# Patient Record
Sex: Female | Born: 1955
Health system: Southern US, Community
[De-identification: ages and names within clinical notes are randomized; demographics above are authoritative.]

## PROBLEM LIST (undated history)

## (undated) DIAGNOSIS — G4733 Obstructive sleep apnea (adult) (pediatric): Secondary | ICD-10-CM

## (undated) DIAGNOSIS — I739 Peripheral vascular disease, unspecified: Secondary | ICD-10-CM

## (undated) DIAGNOSIS — I608 Other nontraumatic subarachnoid hemorrhage: Secondary | ICD-10-CM

## (undated) DIAGNOSIS — D649 Anemia, unspecified: Secondary | ICD-10-CM

## (undated) DIAGNOSIS — M1990 Unspecified osteoarthritis, unspecified site: Secondary | ICD-10-CM

## (undated) DIAGNOSIS — E785 Hyperlipidemia, unspecified: Secondary | ICD-10-CM

## (undated) DIAGNOSIS — R569 Unspecified convulsions: Secondary | ICD-10-CM

## (undated) DIAGNOSIS — I5032 Chronic diastolic (congestive) heart failure: Secondary | ICD-10-CM

## (undated) DIAGNOSIS — I639 Cerebral infarction, unspecified: Secondary | ICD-10-CM

## (undated) DIAGNOSIS — Z9989 Dependence on other enabling machines and devices: Secondary | ICD-10-CM

## (undated) DIAGNOSIS — R011 Cardiac murmur, unspecified: Secondary | ICD-10-CM

## (undated) DIAGNOSIS — I340 Nonrheumatic mitral (valve) insufficiency: Secondary | ICD-10-CM

## (undated) DIAGNOSIS — K219 Gastro-esophageal reflux disease without esophagitis: Secondary | ICD-10-CM

## (undated) DIAGNOSIS — E559 Vitamin D deficiency, unspecified: Secondary | ICD-10-CM

## (undated) DIAGNOSIS — I10 Essential (primary) hypertension: Secondary | ICD-10-CM

## (undated) HISTORY — DX: Nonrheumatic mitral (valve) insufficiency: I34.0

## (undated) HISTORY — DX: Cardiac murmur, unspecified: R01.1

## (undated) HISTORY — DX: Obstructive sleep apnea (adult) (pediatric): G47.33

## (undated) HISTORY — DX: Essential (primary) hypertension: I10

## (undated) HISTORY — PX: ABDOMINAL HYSTERECTOMY: SHX81

## (undated) HISTORY — DX: Unspecified convulsions: R56.9

## (undated) HISTORY — DX: Chronic diastolic (congestive) heart failure: I50.32

## (undated) HISTORY — DX: Hyperlipidemia, unspecified: E78.5

## (undated) HISTORY — DX: Other nontraumatic subarachnoid hemorrhage: I60.8

## (undated) HISTORY — DX: Vitamin D deficiency, unspecified: E55.9

## (undated) HISTORY — PX: TONSILLECTOMY AND ADENOIDECTOMY: SUR1326

## (undated) HISTORY — PX: TUBAL LIGATION: SHX77

## (undated) HISTORY — PX: OTHER SURGICAL HISTORY: SHX169

## (undated) HISTORY — PX: ANEURYSM COILING: SHX5349

## (undated) HISTORY — DX: Obstructive sleep apnea (adult) (pediatric): Z99.89

---
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
## 1997-05-27 ENCOUNTER — Encounter: Admission: RE | Admit: 1997-05-27 | Discharge: 1997-05-27 | Payer: Self-pay | Admitting: Hematology and Oncology

## 1997-07-07 ENCOUNTER — Encounter: Admission: RE | Admit: 1997-07-07 | Discharge: 1997-07-07 | Payer: Self-pay | Admitting: Hematology and Oncology

## 1997-07-16 ENCOUNTER — Encounter: Admission: RE | Admit: 1997-07-16 | Discharge: 1997-10-14 | Payer: Self-pay | Admitting: *Deleted

## 1997-07-24 ENCOUNTER — Encounter: Admission: RE | Admit: 1997-07-24 | Discharge: 1997-07-24 | Payer: Self-pay | Admitting: Internal Medicine

## 1997-08-21 ENCOUNTER — Encounter: Admission: RE | Admit: 1997-08-21 | Discharge: 1997-08-21 | Payer: Self-pay | Admitting: Internal Medicine

## 1997-09-29 ENCOUNTER — Encounter: Admission: RE | Admit: 1997-09-29 | Discharge: 1997-09-29 | Payer: Self-pay | Admitting: Internal Medicine

## 1998-02-22 ENCOUNTER — Ambulatory Visit (HOSPITAL_COMMUNITY): Admission: RE | Admit: 1998-02-22 | Discharge: 1998-02-22 | Payer: Self-pay

## 1998-04-07 ENCOUNTER — Encounter: Admission: RE | Admit: 1998-04-07 | Discharge: 1998-04-07 | Payer: Self-pay | Admitting: Hematology and Oncology

## 1998-08-10 ENCOUNTER — Encounter: Admission: RE | Admit: 1998-08-10 | Discharge: 1998-08-10 | Payer: Self-pay | Admitting: Internal Medicine

## 1999-03-02 ENCOUNTER — Ambulatory Visit (HOSPITAL_COMMUNITY): Admission: RE | Admit: 1999-03-02 | Discharge: 1999-03-02 | Payer: Self-pay | Admitting: Gastroenterology

## 1999-09-21 ENCOUNTER — Ambulatory Visit (HOSPITAL_COMMUNITY): Admission: RE | Admit: 1999-09-21 | Discharge: 1999-09-21 | Payer: Self-pay | Admitting: Internal Medicine

## 1999-09-21 ENCOUNTER — Encounter: Payer: Self-pay | Admitting: Internal Medicine

## 1999-10-05 ENCOUNTER — Encounter: Payer: Self-pay | Admitting: Internal Medicine

## 1999-10-05 ENCOUNTER — Ambulatory Visit (HOSPITAL_COMMUNITY): Admission: RE | Admit: 1999-10-05 | Discharge: 1999-10-05 | Payer: Self-pay | Admitting: Internal Medicine

## 1999-11-29 ENCOUNTER — Emergency Department (HOSPITAL_COMMUNITY): Admission: EM | Admit: 1999-11-29 | Discharge: 1999-11-29 | Payer: Self-pay | Admitting: Emergency Medicine

## 1999-12-09 ENCOUNTER — Emergency Department (HOSPITAL_COMMUNITY): Admission: EM | Admit: 1999-12-09 | Discharge: 1999-12-09 | Payer: Self-pay | Admitting: Emergency Medicine

## 2000-05-08 ENCOUNTER — Ambulatory Visit (HOSPITAL_COMMUNITY): Admission: RE | Admit: 2000-05-08 | Discharge: 2000-05-08 | Payer: Self-pay | Admitting: Urology

## 2000-05-08 ENCOUNTER — Encounter: Payer: Self-pay | Admitting: Urology

## 2000-06-04 ENCOUNTER — Encounter: Payer: Self-pay | Admitting: Internal Medicine

## 2000-06-04 ENCOUNTER — Ambulatory Visit (HOSPITAL_COMMUNITY): Admission: RE | Admit: 2000-06-04 | Discharge: 2000-06-04 | Payer: Self-pay | Admitting: Internal Medicine

## 2000-11-19 ENCOUNTER — Encounter: Payer: Self-pay | Admitting: Urology

## 2000-11-19 ENCOUNTER — Ambulatory Visit (HOSPITAL_COMMUNITY): Admission: RE | Admit: 2000-11-19 | Discharge: 2000-11-19 | Payer: Self-pay | Admitting: Urology

## 2001-06-06 ENCOUNTER — Encounter: Payer: Self-pay | Admitting: Internal Medicine

## 2001-06-06 ENCOUNTER — Ambulatory Visit (HOSPITAL_COMMUNITY): Admission: RE | Admit: 2001-06-06 | Discharge: 2001-06-06 | Payer: Self-pay | Admitting: Internal Medicine

## 2001-11-27 ENCOUNTER — Inpatient Hospital Stay (HOSPITAL_COMMUNITY): Admission: EM | Admit: 2001-11-27 | Discharge: 2001-12-12 | Payer: Self-pay | Admitting: Emergency Medicine

## 2001-11-27 ENCOUNTER — Encounter: Payer: Self-pay | Admitting: Neurosurgery

## 2001-11-27 ENCOUNTER — Encounter: Payer: Self-pay | Admitting: Emergency Medicine

## 2001-11-29 ENCOUNTER — Encounter: Payer: Self-pay | Admitting: Pulmonary Disease

## 2001-11-29 ENCOUNTER — Encounter: Payer: Self-pay | Admitting: Neurosurgery

## 2001-11-30 ENCOUNTER — Encounter: Payer: Self-pay | Admitting: Neurosurgery

## 2001-12-03 ENCOUNTER — Encounter: Payer: Self-pay | Admitting: Neurosurgery

## 2002-01-28 ENCOUNTER — Inpatient Hospital Stay (HOSPITAL_COMMUNITY): Admission: AD | Admit: 2002-01-28 | Discharge: 2002-01-29 | Payer: Self-pay | Admitting: Interventional Radiology

## 2002-03-26 ENCOUNTER — Ambulatory Visit (HOSPITAL_COMMUNITY): Admission: RE | Admit: 2002-03-26 | Discharge: 2002-03-26 | Payer: Self-pay | Admitting: Interventional Radiology

## 2002-04-17 ENCOUNTER — Inpatient Hospital Stay (HOSPITAL_COMMUNITY): Admission: RE | Admit: 2002-04-17 | Discharge: 2002-04-18 | Payer: Self-pay | Admitting: Interventional Radiology

## 2002-06-09 ENCOUNTER — Ambulatory Visit (HOSPITAL_COMMUNITY): Admission: RE | Admit: 2002-06-09 | Discharge: 2002-06-09 | Payer: Self-pay | Admitting: Interventional Radiology

## 2002-10-10 ENCOUNTER — Ambulatory Visit (HOSPITAL_COMMUNITY): Admission: RE | Admit: 2002-10-10 | Discharge: 2002-10-10 | Payer: Self-pay | Admitting: Interventional Radiology

## 2002-11-26 ENCOUNTER — Ambulatory Visit (HOSPITAL_COMMUNITY): Admission: RE | Admit: 2002-11-26 | Discharge: 2002-11-26 | Payer: Self-pay | Admitting: Internal Medicine

## 2003-01-27 ENCOUNTER — Ambulatory Visit (HOSPITAL_COMMUNITY): Admission: RE | Admit: 2003-01-27 | Discharge: 2003-01-27 | Payer: Self-pay | Admitting: Interventional Radiology

## 2003-05-01 ENCOUNTER — Emergency Department (HOSPITAL_COMMUNITY): Admission: EM | Admit: 2003-05-01 | Discharge: 2003-05-01 | Payer: Self-pay | Admitting: Emergency Medicine

## 2003-09-18 ENCOUNTER — Ambulatory Visit (HOSPITAL_COMMUNITY): Admission: RE | Admit: 2003-09-18 | Discharge: 2003-09-18 | Payer: Self-pay | Admitting: Interventional Radiology

## 2003-12-15 ENCOUNTER — Ambulatory Visit (HOSPITAL_COMMUNITY): Admission: RE | Admit: 2003-12-15 | Discharge: 2003-12-15 | Payer: Self-pay | Admitting: Internal Medicine

## 2004-05-17 ENCOUNTER — Ambulatory Visit (HOSPITAL_COMMUNITY): Admission: RE | Admit: 2004-05-17 | Discharge: 2004-05-17 | Payer: Self-pay | Admitting: Internal Medicine

## 2004-06-22 ENCOUNTER — Ambulatory Visit (HOSPITAL_COMMUNITY): Admission: RE | Admit: 2004-06-22 | Discharge: 2004-06-22 | Payer: Self-pay | Admitting: Interventional Radiology

## 2004-09-12 ENCOUNTER — Ambulatory Visit (HOSPITAL_COMMUNITY): Admission: RE | Admit: 2004-09-12 | Discharge: 2004-09-12 | Payer: Self-pay | Admitting: Gastroenterology

## 2004-09-12 ENCOUNTER — Encounter (INDEPENDENT_AMBULATORY_CARE_PROVIDER_SITE_OTHER): Payer: Self-pay | Admitting: *Deleted

## 2004-12-19 ENCOUNTER — Ambulatory Visit (HOSPITAL_COMMUNITY): Admission: RE | Admit: 2004-12-19 | Discharge: 2004-12-19 | Payer: Self-pay | Admitting: Internal Medicine

## 2005-01-21 ENCOUNTER — Ambulatory Visit (HOSPITAL_COMMUNITY): Admission: RE | Admit: 2005-01-21 | Discharge: 2005-01-21 | Payer: Self-pay | Admitting: Interventional Radiology

## 2005-01-23 DIAGNOSIS — I639 Cerebral infarction, unspecified: Secondary | ICD-10-CM

## 2005-01-23 HISTORY — DX: Cerebral infarction, unspecified: I63.9

## 2005-12-21 ENCOUNTER — Ambulatory Visit (HOSPITAL_COMMUNITY): Admission: RE | Admit: 2005-12-21 | Discharge: 2005-12-21 | Payer: Self-pay | Admitting: Internal Medicine

## 2006-12-24 ENCOUNTER — Ambulatory Visit (HOSPITAL_COMMUNITY): Admission: RE | Admit: 2006-12-24 | Discharge: 2006-12-24 | Payer: Self-pay | Admitting: Internal Medicine

## 2007-03-20 ENCOUNTER — Ambulatory Visit (HOSPITAL_COMMUNITY): Admission: RE | Admit: 2007-03-20 | Discharge: 2007-03-20 | Payer: Self-pay | Admitting: Interventional Radiology

## 2007-04-24 ENCOUNTER — Encounter: Payer: Self-pay | Admitting: Interventional Radiology

## 2007-04-29 ENCOUNTER — Ambulatory Visit (HOSPITAL_COMMUNITY): Admission: RE | Admit: 2007-04-29 | Discharge: 2007-04-29 | Payer: Self-pay | Admitting: Interventional Radiology

## 2007-06-09 ENCOUNTER — Emergency Department (HOSPITAL_COMMUNITY): Admission: EM | Admit: 2007-06-09 | Discharge: 2007-06-09 | Payer: Self-pay | Admitting: Emergency Medicine

## 2007-12-26 ENCOUNTER — Ambulatory Visit (HOSPITAL_COMMUNITY): Admission: RE | Admit: 2007-12-26 | Discharge: 2007-12-26 | Payer: Self-pay | Admitting: Internal Medicine

## 2008-04-28 ENCOUNTER — Ambulatory Visit (HOSPITAL_COMMUNITY): Admission: RE | Admit: 2008-04-28 | Discharge: 2008-04-28 | Payer: Self-pay | Admitting: Interventional Radiology

## 2008-12-09 ENCOUNTER — Emergency Department (HOSPITAL_COMMUNITY): Admission: EM | Admit: 2008-12-09 | Discharge: 2008-12-09 | Payer: Self-pay | Admitting: Emergency Medicine

## 2008-12-28 ENCOUNTER — Ambulatory Visit (HOSPITAL_COMMUNITY): Admission: RE | Admit: 2008-12-28 | Discharge: 2008-12-28 | Payer: Self-pay | Admitting: Obstetrics & Gynecology

## 2009-07-05 ENCOUNTER — Ambulatory Visit (HOSPITAL_COMMUNITY): Admission: RE | Admit: 2009-07-05 | Discharge: 2009-07-05 | Payer: Self-pay | Admitting: Interventional Radiology

## 2009-10-23 LAB — HM COLONOSCOPY: HM Colonoscopy: NEGATIVE

## 2010-01-03 ENCOUNTER — Ambulatory Visit (HOSPITAL_COMMUNITY)
Admission: RE | Admit: 2010-01-03 | Discharge: 2010-01-03 | Payer: Self-pay | Source: Home / Self Care | Attending: Internal Medicine | Admitting: Internal Medicine

## 2010-01-10 ENCOUNTER — Encounter
Admission: RE | Admit: 2010-01-10 | Discharge: 2010-01-10 | Payer: Self-pay | Source: Home / Self Care | Attending: Internal Medicine | Admitting: Internal Medicine

## 2010-04-11 LAB — CREATININE, SERUM
GFR calc Af Amer: >60 mL/min (ref >60)
GFR calc non Af Amer: >60 mL/min (ref >60)

## 2010-04-11 LAB — BUN: BUN: 15 mg/dL (ref 6–23)

## 2010-04-27 LAB — POCT I-STAT, CHEM 8
Calcium, Ion: 1.22 mmol/L (ref 1.12–1.32)
Creatinine, Ser: 1 mg/dL (ref 0.4–1.2)
Sodium: 138 mEq/L (ref 135–145)

## 2010-05-04 LAB — CREATININE, SERUM: GFR calc Af Amer: >60 mL/min (ref >60)

## 2010-05-04 LAB — BUN: BUN: 11 mg/dL (ref 6–23)

## 2010-06-10 NOTE — Discharge Summary (Signed)
   Yolanda Espinoza, Yolanda Espinoza                           ACCOUNT NO.:  [id number]   MEDICAL RECORD NO.:  [id number]                   PATIENT TYPE:  INP   LOCATION:  [id number]                                 FACILITY:  [facility]   PHYSICIAN:  Elizabeth Sauer, M.D.                   DATE OF BIRTH:  [birth year]   DATE OF ADMISSION:  11/27/2001  DATE OF DISCHARGE:  12/12/2001                                 DISCHARGE SUMMARY   ADMITTING DIAGNOSIS:  Subarachnoid hemorrhage.   DISCHARGE DIAGNOSIS:  Subarachnoid hemorrhage.   PROCEDURE:  Angiographic coiling of posterior communicating artery aneurysm.   SURGEON:  Elizabeth Sauer, M.D.   COMPLICATIONS:  None.   DISCHARGE STATUS:  Home, well.   BODY OF TEXT:  This is a 55 year old right-handed black female with History  and Physical as recounted on the chart.  She suffered a subarachnoid  hemorrhage and a second bleed on 11/27/01.  She was a grade III subarachnoid  hemorrhage and was admitted.  She underwent coiling of the aneurysm.  She  was extubated two days after the coiling, is awake and alert.  Started on  nimodipine.  While on amlodipine, she was followed with transcranial  Dopplers and was found to have mild vasospasm which never became  symptomatic.  Serial CT scans did not demonstrate any hydrocephalus, and she  remained neurologically intact.  Chief complaint was headache which was  managed with a PCA and then oral pain medications.   Currently, she is 14 days out from her hemorrhage, awake, alert, and  oriented, on nimodipine with decreasing transcranial Dopplers and no  headache.  Her neurologic exam is intact.  She is being discharged home in  the care of her family with another week of nimodipine.  Her follow up will  be in the [place] office in three or four weeks at  which time we will arrange for another angiogram and attempted coiling of  the contralateral posterior communicating artery aneurysm.                                  Elizabeth Sauer, M.D.    MWR/MEDQ  D:  12/12/2001  T:  12/12/2001  Job:  [id number]

## 2010-06-10 NOTE — Procedures (Signed)
[place]. [facility]  Patient:    Yolanda Espinoza, Yolanda Espinoza                       MRN: [id number] Proc. Date: 03/02/99 Adm. Date:  [id number] Attending:  Juanita Craver CC:         Orpah Melter, M.D.                           Procedure Report  DATE OF BIRTH:  [birth year]  REFERRING PHYSICIAN:  Orpah Melter, M.D.  PROCEDURE PERFORMED:  Esophagogastroduodenoscopy.  ENDOSCOPIST:  Nelwyn Salisbury, M.D.  INSTRUMENT USED:  Olympus video panendoscope.  INDICATIONS FOR PROCEDURE:  Iron-deficiency anemia in a 55 year old black female, rule out peptic ulcer disease, esophagitis, gastritis, AVMs, etc.  PREPROCEDURE PREPARATION:  Informed consent was procured from the patient.  The  patient was fasted for eight hours prior to the procedure.  PREPROCEDURE PHYSICAL:  VITAL SIGNS:  Stable except for some hypertension with blood pressure of 190/130.  NECK:  Supple.  CHEST:  Clear to auscultation, S1 and S2 regular.  Surgical scar present in the  right chest.  ABDOMEN:  Soft with normal abdominal bowel sounds.  No hepatosplenomegaly and no masses palpable.  DESCRIPTION OF PROCEDURE:  The patient was placed in the left lateral decubitus  position, and sedated with 50 mg of Demerol and 5 mg of Versed intravenously. nce the patient was adequately sedated, maintained on low flow oxygen and continuous cardiac monitoring, the Olympus video panendoscope was advanced through the mouth piece over the tongue into the esophagus under direct vision.  The entire esophagus appeared normal without evidence of ring stricture, masses, lesions, or esophagitis.  The scope was then advanced into the stomach.  There was patchy erythema seen in the high fundus and cardia consistent with mild to moderate gastritis.  No frank ulcers were seen.  The mid body and the antrum appeared healthy, and so did the proximal small bowel.  The patient tolerated the procedure well  without complication.  IMPRESSION:  Patchy gastritis in the high fundus and cardia, otherwise normal esophagogastroduodenoscopy.  RECOMMENDATIONS: 1. The patient has strongly been advised to refrain from the use of all    nonsteroidals. 2. She will be tried on Prevacid ____ mg one p.o. q.d., and samples will be given    to ________ next six weeks.  Further recommendations will be made after    colonoscopy has been completed. DD:  03/02/99 TD:  03/02/99 Job: [id number]

## 2010-06-10 NOTE — Op Note (Signed)
Yolanda Espinoza, Yolanda Espinoza                 ACCOUNT NO.:  [id number]   MEDICAL RECORD NO.:  [id number]          PATIENT TYPE:  AMB   LOCATION:  ENDO                         FACILITY:  [facility]   PHYSICIAN:  Nelwyn Salisbury, M.D.  DATE OF BIRTH:  01-May-1955   DATE OF PROCEDURE:  09/12/2004  DATE OF DISCHARGE:                                 OPERATIVE REPORT   PROCEDURE PERFORMED:  Colonoscopy with cold biopsies x1.   ENDOSCOPIST:  Nelwyn Salisbury, M.D.   INSTRUMENT USED:  Olympus video colonoscope.   INDICATION FOR PROCEDURE:  A 55 year old African-American female with a  personal history of colonic polyps removed in the past and mild anemia,  undergoing a repeat colonoscopy.  Rule out recurrent polyps.   PREPROCEDURE PREPARATION:  Informed consent was procured from the patient.  The patient was fasted for eight hours prior to the procedure and prepped  with a bottle of magnesium citrate and a gallon of GoLYTELY the night prior  to the procedure.  The risks and benefits of the procedure, including a 10%  miss rate of cancer and polyps, were discussed with the patient as well.   PREPROCEDURE PHYSICAL:  VITAL SIGNS:  The patient had stable vital signs.  NECK:  Supple.  CHEST:  Clear to auscultation.  ABDOMEN:  Soft with normal bowel sounds.   DESCRIPTION OF PROCEDURE:  The patient was placed in the left lateral  decubitus position and sedated with 70 mg of Demerol and 7 mg of Versed in  slow incremental doses.  Once the patient was adequately sedate and  maintained on low-flow oxygen and continuous cardiac monitoring, the Olympus  video colonoscope was advanced from the rectum to the cecum with difficulty.  There was a large amount of residual stool in the colon.  Multiple washes  were done.  Small lesions could be missed.  A small sessile polyp was  biopsied from the IC valve (cold biopsies x3).  The rest of the exam was  unremarkable; however, as there was a large amount of residual stool  in the  colon, small lesions could be missed.   IMPRESSION:  1.  Small sessile polyp biopsied from right colon over the IC valve (cold      biopsies x3).  2.  Otherwise unrevealing exam.   RECOMMENDATIONS:  1.  Await pathology results.  2.  Repeat colonoscopy depending on pathology results.  3.  Avoid nonsteroidals for the next two weeks.  4.  Outpatient follow-up as the need arises in the future.      Nelwyn Salisbury, M.D.  Electronically Signed     JNM/MEDQ  D:  09/12/2004  T:  09/12/2004  Job:  [id number]   cc:   Ardeen Jourdain, M.D.  [id number]  [place] [address]  Fax: [phone]

## 2010-06-10 NOTE — Procedures (Signed)
[place]. [facility]  Patient:    Yolanda Espinoza, Yolanda Espinoza                       MRN: [id number] Proc. Date: 03/02/99 Adm. Date:  [id number] Attending:  Juanita Craver                           Procedure Report  DATE OF BIRTH:  06/13/1955  REFERRING PHYSICIAN:  Elenore Rota ______ , M.D.  PROCEDURE PERFORMED:  Colonoscopy with snare polypectomy.  ENDOSCOPIST:  Nelwyn Salisbury, M.D.  INSTRUMENT USED:  Olympus video colonoscope.  INDICATIONS:  Iron-deficiency anemia in a 55 year old black female.  Rule out masses, polyps, erosions, ulcerations, etc.  PREPROCEDURE PREPARATION:  Informed consent was procured from the patient.  The  patient was fasted for 8 hours prior to the procedure and prepped with a bottle of magnesium citrate and a gallon of NuLytely the night prior to the procedure.  PREPROCEDURE PHYSICAL:  Patient has stable vital signs.  NECK:  Supple.  CHEST:  Clear to auscultation. S1, S2 regular.  ABDOMEN:  Soft with normal abdominal bowel sounds.  DESCRIPTION OF PROCEDURE:  The patient was placed in left lateral decubitus position and sedated with an additional 30 mg of Demerol and 3 mg of Versed intravenously.  Once the patient was adequately sedated and maintained on low-flow oxygen and continuous cardiac monitoring, the Olympus video colonoscope was advanced from the rectum to the cecum without difficulty.  The patient has a small pedunculated polyp snared from 25 cm without difficulty.  No abnormalities were  present.  The patient tolerated the procedure well without complication.  The procedure was completed after the cecum.  IMPRESSION:  One small pedunculated polyp removed from left colon at 25 cm by snare polypectomy.  Otherwise normal-appearing colon.  RECOMMENDATIONS: 1. Await pathology results. 2. Avoid nonsteroidals. 3. Follow up in the office the next two weeks for further recommendations. DD:  03/02/99 TD:  03/02/99 Job:  [id number]

## 2010-06-10 NOTE — H&P (Signed)
NAME:  Yolanda Espinoza, Yolanda Espinoza NO.:  [id number]   MEDICAL RECORD NO.:  [id number]                   PATIENT TYPE:   LOCATION:                                       FACILITY:   PHYSICIAN:  Elizabeth Sauer, M.D.                   DATE OF BIRTH:  1955/04/28   DATE OF ADMISSION:  11/27/2001  DATE OF DISCHARGE:                                HISTORY & PHYSICAL   ADMISSION DIAGNOSIS:  Subarachnoid hemorrhage.   HISTORY OF PRESENT ILLNESS:  A 55 year old presumably right handed black  female with the onset of headache between 4 and 4:30 this morning according  to her boyfriend. She had no loss of consciousness but said he said she was  very stunned and could not move for a while. She was transferred to [facility], apparently arrived there about 6, went to CT scan, I was called about  7 a.m. Apparently at about 7:10, the patient had a second ictus requiring  intubation. When I was called at 7 a.m., I requested Nipride for blood  pressure control and angiogram. When I arrived, the patient was intubated,  coughing on the endotracheal tube with a blood pressure in the 200s/100s,  valsalving into tube. The Nipride was lying in the bed, and angio did not  know about the patient.   At that time, I ordered Pavulon and morphine for sedation and strongly  encouraged the nurses and doctors in the emergency room to get the Nipride  started.   PAST MEDICAL HISTORY:  Hypertension. The patient is on Lotensin and Cozaar.   PAST SURGICAL HISTORY:  Stomach operation which from the incision appears to  be a hysterectomy.   ALLERGIES:  None known.   FAMILY HISTORY:  The patient is sedated on the ventilator, so the family  history is not obtainable.   SOCIAL HISTORY:  Not known at this time. She is sedated on the ventilator.   PHYSICAL EXAMINATION:  HEENT:  Within normal limits.  NECK:  Without lymphadenopathy.  CHEST:  Coarse rales.  CARDIAC EXAM:  Regular, rate, and  rhythm.  ABDOMEN:  Nontender with a well-healed incision.  EXTREMITIES:  Have good pulses.  GENITOURINARY:  Deferred.  NEUROLOGICAL:  She is sedated but slightly opens her eyes. Her pupils are  equal, round, and reactive to light. Her extraocular movements are intact.  She has a positive cough, positive gag, and is moving all extremities weakly  after the Pavulon.   ASSESSMENT:  Grade III subarachnoid hemorrhage with a rebleed. She needs  angio and since she has had a second bleed will require coiling or clipping  fairly soon. The prognosis has been discussed with her boyfriend.  Elizabeth Sauer, M.D.   MWR/MEDQ  D:  11/27/2001  T:  11/27/2001  Job:  [id number]

## 2010-08-01 ENCOUNTER — Other Ambulatory Visit (HOSPITAL_COMMUNITY): Payer: Self-pay | Admitting: Interventional Radiology

## 2010-08-01 DIAGNOSIS — I729 Aneurysm of unspecified site: Secondary | ICD-10-CM

## 2010-08-01 DIAGNOSIS — R519 Headache, unspecified: Secondary | ICD-10-CM

## 2010-08-08 ENCOUNTER — Ambulatory Visit (HOSPITAL_COMMUNITY)
Admission: RE | Admit: 2010-08-08 | Discharge: 2010-08-08 | Disposition: A | Discharge status: Home / Self Care | Payer: BC Managed Care – PPO | Source: Ambulatory Visit | Attending: Interventional Radiology | Admitting: Interventional Radiology

## 2010-08-08 DIAGNOSIS — Z09 Encounter for follow-up examination after completed treatment for conditions other than malignant neoplasm: Secondary | ICD-10-CM | POA: Insufficient documentation

## 2010-08-08 DIAGNOSIS — I729 Aneurysm of unspecified site: Secondary | ICD-10-CM

## 2010-08-08 DIAGNOSIS — R51 Headache: Secondary | ICD-10-CM | POA: Insufficient documentation

## 2010-08-08 DIAGNOSIS — I720 Aneurysm of carotid artery: Secondary | ICD-10-CM | POA: Insufficient documentation

## 2010-08-08 LAB — BUN: BUN: 15 mg/dL (ref 6–23)

## 2010-08-08 LAB — CREATININE, SERUM: Creatinine, Ser: 0.81 mg/dL (ref 0.50–1.10)

## 2010-08-08 MED ORDER — GADOBENATE DIMEGLUMINE 529 MG/ML IV SOLN: 20.0000 mL | Freq: Once | INTRAVENOUS | Status: DC | PRN

## 2010-08-09 MED ORDER — GADOBENATE DIMEGLUMINE 529 MG/ML IV SOLN
20.0000 mL | Freq: Once | INTRAVENOUS | Status: AC
  Administered 2010-08-08: 20 mL via INTRAVENOUS

## 2010-10-17 LAB — CREATININE, SERUM
Creatinine, Ser: 0.93
GFR calc Af Amer: >60

## 2010-10-18 LAB — BASIC METABOLIC PANEL
Calcium: 10
GFR calc Af Amer: >60
GFR calc non Af Amer: >60
Sodium: 138

## 2010-10-18 LAB — PROTIME-INR: INR: 0.9

## 2010-10-18 LAB — CBC
Hemoglobin: 10.9 — ABNORMAL LOW
RBC: 3.83 — ABNORMAL LOW

## 2010-12-17 ENCOUNTER — Encounter: Payer: Self-pay | Admitting: Nurse Practitioner

## 2010-12-17 ENCOUNTER — Emergency Department (HOSPITAL_COMMUNITY)
Admission: EM | Admit: 2010-12-17 | Discharge: 2010-12-17 | Disposition: A | Discharge status: Home / Self Care | Payer: BC Managed Care – PPO | Attending: Emergency Medicine | Admitting: Emergency Medicine

## 2010-12-17 ENCOUNTER — Emergency Department (HOSPITAL_COMMUNITY): Payer: BC Managed Care – PPO

## 2010-12-17 DIAGNOSIS — R05 Cough: Secondary | ICD-10-CM | POA: Insufficient documentation

## 2010-12-17 DIAGNOSIS — R61 Generalized hyperhidrosis: Secondary | ICD-10-CM | POA: Insufficient documentation

## 2010-12-17 DIAGNOSIS — Z79899 Other long term (current) drug therapy: Secondary | ICD-10-CM | POA: Insufficient documentation

## 2010-12-17 DIAGNOSIS — R059 Cough, unspecified: Secondary | ICD-10-CM | POA: Insufficient documentation

## 2010-12-17 DIAGNOSIS — R5381 Other malaise: Secondary | ICD-10-CM | POA: Insufficient documentation

## 2010-12-17 DIAGNOSIS — J069 Acute upper respiratory infection, unspecified: Secondary | ICD-10-CM | POA: Insufficient documentation

## 2010-12-17 DIAGNOSIS — R0982 Postnasal drip: Secondary | ICD-10-CM | POA: Insufficient documentation

## 2010-12-17 DIAGNOSIS — R070 Pain in throat: Secondary | ICD-10-CM | POA: Insufficient documentation

## 2010-12-17 DIAGNOSIS — J343 Hypertrophy of nasal turbinates: Secondary | ICD-10-CM | POA: Insufficient documentation

## 2010-12-17 DIAGNOSIS — Z8673 Personal history of transient ischemic attack (TIA), and cerebral infarction without residual deficits: Secondary | ICD-10-CM | POA: Insufficient documentation

## 2010-12-17 DIAGNOSIS — R51 Headache: Secondary | ICD-10-CM | POA: Insufficient documentation

## 2010-12-17 DIAGNOSIS — J3489 Other specified disorders of nose and nasal sinuses: Secondary | ICD-10-CM | POA: Insufficient documentation

## 2010-12-17 DIAGNOSIS — IMO0001 Reserved for inherently not codable concepts without codable children: Secondary | ICD-10-CM | POA: Insufficient documentation

## 2010-12-17 DIAGNOSIS — R6883 Chills (without fever): Secondary | ICD-10-CM | POA: Insufficient documentation

## 2010-12-17 MED ORDER — HYDROCOD POLST-CHLORPHEN POLST 10-8 MG/5ML PO LQCR
5.0000 mL | Freq: Once | ORAL | Status: AC
  Administered 2010-12-17: 5 mL via ORAL
  Filled 2010-12-17: qty 5

## 2010-12-17 MED ORDER — HYDROCOD POLST-CHLORPHEN POLST 10-8 MG/5ML PO LQCR: 5.0000 mL | Freq: Two times a day (BID) | ORAL | Status: DC | PRN

## 2010-12-17 MED ORDER — OSELTAMIVIR PHOSPHATE 75 MG PO CAPS: 75.0000 mg | ORAL_CAPSULE | Freq: Two times a day (BID) | ORAL | Status: AC

## 2010-12-17 NOTE — ED Notes (Signed)
C/o dry cough and headache since yesterday

## 2010-12-17 NOTE — ED Provider Notes (Signed)
History     CSN: [id number] Arrival date & time: 12/17/2010 10:04 AM   First MD Initiated Contact with Patient 12/17/10 1025      Chief Complaint  Patient presents with  . Cough  . Headache    (Consider location/radiation/quality/duration/timing/severity/associated sxs/prior treatment) Patient is a 55 y.o. female presenting with cough. The history is provided by the patient.  Cough This is a new problem. The current episode started yesterday. The problem occurs constantly. The cough is non-productive. Associated symptoms include chills, sweats, rhinorrhea, sore throat and myalgias. Pertinent negatives include no chest pain and no ear pain. She is not a smoker.  Pt states she developed sudden onset of body aches, headache, sore throat, nasal congestion, cough yesterday while at work. Pt works at an assisted living facility. Pt states she took tylenol yesterday and again this morning prior to coming in. States no relief of symptoms. Admits to sweats and chills, did not measure temp at home.   Past Medical History  Diagnosis Date  . Cerebrovascular accident     Past Surgical History  Procedure Date  . Aneurysm coiling     History reviewed. No pertinent family history.  History  Substance Use Topics  . Smoking status: Never Smoker   . Smokeless tobacco: Not on file  . Alcohol Use: No    OB History    Grav Para Term Preterm Abortions TAB SAB Ect Mult Living                  Review of Systems  Constitutional: Positive for chills and fatigue.  HENT: Positive for congestion, sore throat, rhinorrhea and postnasal drip. Negative for ear pain and neck pain.   Eyes: Negative.   Respiratory: Positive for cough.   Cardiovascular: Negative for chest pain.  Genitourinary: Negative.   Musculoskeletal: Positive for myalgias.  Skin: Negative.   Neurological: Negative.   Psychiatric/Behavioral: Negative.     Allergies  Review of patient's allergies indicates no known  allergies.  Home Medications   Current Outpatient Rx  Name Route Sig Dispense Refill  . AMLODIPINE-VALSARTAN-HCTZ 10-320-25 MG PO TABS Oral Take 1 tablet by mouth daily.      Marland Kitchen ESTROGENS CONJUGATED 0.9 MG PO TABS Oral Take 0.9 mg by mouth daily.      . OMEGA-3-ACID ETHYL ESTERS 1 G PO CAPS Oral Take 2 g by mouth 2 (two) times daily.        BP 126/81  Pulse 106  Temp(Src) 99.5 F (37.5 C) (Oral)  Resp 20  Ht 5\' 10"  (1.778 m)  Wt 229 lb (103.874 kg)  BMI 32.86 kg/m2  SpO2 96%  Physical Exam  Nursing note and vitals reviewed. Constitutional: She is oriented to person, place, and time. She appears well-developed and well-nourished.  HENT:  Head: Normocephalic and atraumatic.  Right Ear: Tympanic membrane, external ear and ear canal normal.  Left Ear: Tympanic membrane, external ear and ear canal normal.  Nose: Mucosal edema and rhinorrhea present.  Mouth/Throat: Uvula is midline and mucous membranes are normal. Posterior oropharyngeal erythema present. No oropharyngeal exudate or posterior oropharyngeal edema.  Eyes: Pupils are equal, round, and reactive to light.  Neck: Normal range of motion. Neck supple.       No meningial signs  Cardiovascular: Normal rate, regular rhythm and normal heart sounds.   Pulmonary/Chest: Breath sounds normal. No respiratory distress. She has no wheezes.  Abdominal: Soft. Bowel sounds are normal. There is no tenderness.  Musculoskeletal: She exhibits no edema.  Lymphadenopathy:    She has no cervical adenopathy.  Neurological: She is alert and oriented to person, place, and time.  Skin: Skin is warm and dry. No erythema.  Psychiatric: She has a normal mood and affect.    ED Course  Procedures (including critical care time)  Pt with flu like symptoms. Will get CXR to r/o pneuminoa. Pt with low grade fever after taking tylenol. Coughing. No meningismus on exam. Tussionex ordered for cough  Dg Chest 2 View  12/17/2010  *RADIOLOGY REPORT*   Clinical Data: Cough, chest pain, hypertension  CHEST - 2 VIEW  Comparison: None.  Findings:  Lungs clear.  Heart size and pulmonary vascularity normal.  No effusion.  Visualized bones unremarkable.  IMPRESSION: No acute disease  Original Report Authenticated By: Trecia Rogers, M.D.    11:24 AM CXR negative. Suspect possible flu. Will d/c home with tamiflu and cough medications  MDM          Renold Genta, PA 12/17/10 1124

## 2010-12-17 NOTE — ED Notes (Signed)
Pt. Also reports that she has a hx. Of 2 strokes and has coiling and shunts to the right side of the head.  Pt. Was told to come in when she got HA.

## 2010-12-18 NOTE — ED Provider Notes (Signed)
Evaluation and management procedures were performed by the mid-level provider (PA/NP/CNM) under my supervision/collaboration. I was present and available during the ED course. Ozzie Knobel Y.   Saddie Benders. Dorna Mai, MD 12/18/10 [phone]

## 2011-07-12 ENCOUNTER — Other Ambulatory Visit (HOSPITAL_COMMUNITY): Payer: Self-pay | Admitting: Interventional Radiology

## 2011-07-12 DIAGNOSIS — I729 Aneurysm of unspecified site: Secondary | ICD-10-CM

## 2011-07-12 DIAGNOSIS — Z09 Encounter for follow-up examination after completed treatment for conditions other than malignant neoplasm: Secondary | ICD-10-CM

## 2011-07-13 ENCOUNTER — Telehealth (HOSPITAL_COMMUNITY): Payer: Self-pay

## 2011-07-13 NOTE — Telephone Encounter (Signed)
I spoke with Mrs. Mcclave and advised her of her appt on the 10 of July

## 2011-07-13 NOTE — Telephone Encounter (Signed)
I called Yolanda Espinoza at home and at work to try to set up her f/u appt.  She was off work and there was not vm on her home phone.  She did call back.  She left a vm with her cell # [phone]

## 2011-08-02 ENCOUNTER — Ambulatory Visit (HOSPITAL_COMMUNITY)
Admission: RE | Admit: 2011-08-02 | Discharge: 2011-08-02 | Disposition: A | Discharge status: Home / Self Care | Payer: BC Managed Care – PPO | Source: Ambulatory Visit | Attending: Interventional Radiology | Admitting: Interventional Radiology

## 2011-08-02 ENCOUNTER — Ambulatory Visit (HOSPITAL_COMMUNITY): Payer: BC Managed Care – PPO

## 2011-08-02 DIAGNOSIS — I729 Aneurysm of unspecified site: Secondary | ICD-10-CM | POA: Insufficient documentation

## 2011-08-02 DIAGNOSIS — Z09 Encounter for follow-up examination after completed treatment for conditions other than malignant neoplasm: Secondary | ICD-10-CM | POA: Insufficient documentation

## 2011-08-02 LAB — CREATININE, SERUM
Creatinine, Ser: 0.8 mg/dL (ref 0.50–1.10)
GFR calc Af Amer: >90 mL/min (ref >90)

## 2011-08-02 MED ORDER — GADOBENATE DIMEGLUMINE 529 MG/ML IV SOLN
20.0000 mL | Freq: Once | INTRAVENOUS | Status: AC
  Administered 2011-08-02: 20 mL via INTRAVENOUS

## 2011-08-23 ENCOUNTER — Telehealth (HOSPITAL_COMMUNITY): Payer: Self-pay

## 2011-08-23 NOTE — Telephone Encounter (Signed)
I spoke with Mrs. Yolanda Espinoza in regards to her MRI/MRA results.  I advised her that Dr. Estanislado Pandy stated everything looked stable but, he wanted to have an angio done in 28mnths

## 2012-02-05 ENCOUNTER — Other Ambulatory Visit (HOSPITAL_COMMUNITY): Payer: Self-pay | Admitting: Emergency Medicine

## 2012-02-05 DIAGNOSIS — Z1231 Encounter for screening mammogram for malignant neoplasm of breast: Secondary | ICD-10-CM

## 2012-02-09 ENCOUNTER — Ambulatory Visit (HOSPITAL_COMMUNITY)
Admission: RE | Admit: 2012-02-09 | Discharge: 2012-02-09 | Disposition: A | Discharge status: Home / Self Care | Payer: BC Managed Care – PPO | Source: Ambulatory Visit | Attending: Emergency Medicine | Admitting: Emergency Medicine

## 2012-02-09 DIAGNOSIS — Z1231 Encounter for screening mammogram for malignant neoplasm of breast: Secondary | ICD-10-CM

## 2012-02-09 LAB — HM MAMMOGRAPHY: HM Mammogram: NEGATIVE

## 2012-02-14 ENCOUNTER — Encounter: Payer: Self-pay | Admitting: Cardiovascular Disease

## 2012-02-14 ENCOUNTER — Ambulatory Visit (INDEPENDENT_AMBULATORY_CARE_PROVIDER_SITE_OTHER): Payer: BC Managed Care – PPO | Admitting: Cardiovascular Disease

## 2012-02-14 VITALS — BP 134/74 | HR 63 | Ht 70.0 in | Wt 236.0 lb

## 2012-02-14 DIAGNOSIS — I10 Essential (primary) hypertension: Secondary | ICD-10-CM

## 2012-02-14 DIAGNOSIS — R011 Cardiac murmur, unspecified: Secondary | ICD-10-CM

## 2012-02-14 NOTE — Progress Notes (Signed)
     Yolanda Espinoza Date of Birth  10/10/1955       [place] [id number], [place], [place]  [address]   [place], [place]  [address] [phone]     [phone]   Fax  [phone]    Fax [phone]  Problem List: 1. hypertension 2. Heart murmur 3. Cerebral aneurysm  History of Present Illness:  Yolanda Espinoza is a 57 year old female with a history of hypertension and a cerebral aneurysm. She was to have a heart murmur and was referred here for further evaluation.  She also has been having some shortness of breath. She exercises for 30 minutes every day typically does not have much shortness breath when she is exercising. She seems to have shortness of breath at other times.  She denies any syncope or presyncope. She denies any chest pain.  She works at [employer]. She does also helps the seniors with activities.  Hx of blood pressure on occasion. Her blood pressure readings have been normal.  Current Outpatient Prescriptions on File Prior to Visit  Medication Sig Dispense Refill  . Amlodipine-Valsartan-HCTZ (EXFORGE HCT) 10-320-25 MG TABS Take 1 tablet by mouth daily.        Marland Kitchen estrogens, conjugated, (PREMARIN) 0.9 MG tablet Take 0.9 mg by mouth daily.        Marland Kitchen omega-3 acid ethyl esters (LOVAZA) 1 G capsule Take 2 g by mouth 2 (two) times daily.          No Known Allergies  Past Medical History  Diagnosis Date  . Cerebrovascular accident     3  . Hypertension may, 1973  . Heart murmur     since birth    Past Surgical History  Procedure Date  . Aneurysm coiling   . Head surgery     History  Smoking status  . Former Smoker  Smokeless tobacco  . Not on file    Comment: Quit about 9 yrs ago    History  Alcohol Use No    No family history on file.  Reviw of Systems:  Reviewed in the HPI.  All other systems are negative.  Physical Exam: Blood pressure 134/74, pulse 63,  height 5\' 10"  (1.778 m), weight 236 lb (107.049 kg). General: Well developed, well nourished, in no acute distress.  Head: Normocephalic, atraumatic, sclera non-icteric, mucus membranes are moist,   Neck: Supple. Carotids are 2 + without bruits. No JVD   Lungs: Clear   Heart: Regular rate, normal S1 and S2. She has a 2/6 systolic murmur at the upper left sternal border.  Abdomen: Soft, non-tender, non-distended with normal bowel sounds.  Msk:  Strength and tone are normal   Extremities: No clubbing or cyanosis. No edema.  Distal pedal pulses are 2+ and equal    Neuro: CN II - XII intact.  Alert and oriented X 3.   Psych:  Normal   ECG: February 14, 2012:  Normal sinus rhythm at 63 beats a minute.  Assessment / Plan:

## 2012-02-14 NOTE — Patient Instructions (Addendum)
Your physician has requested that you have an echocardiogram. Echocardiography is a painless test that uses sound waves to create images of your heart. It provides your doctor with information about the size and shape of your heart and how well your heart's chambers and valves are working. This procedure takes approximately one hour. There are no restrictions for this procedure.  Your physician wants you to follow-up in: 1 year  You will receive a reminder letter in the mail two months in advance. If you don't receive a letter, please call our office to schedule the follow-up appointment.  Your physician recommends that you continue on your current medications as directed. Please refer to the Current Medication list given to you today.

## 2012-02-14 NOTE — Assessment & Plan Note (Signed)
Timiya presents for further evaluation of her. She has a history of a heart murmur for a very long time.  Clinically, it sounds like she has mild tricuspid regurgitation and/or perhaps mild mitral regurgitation. Get an echocardiogram for further evaluation. I'll plan on seeing her again in one year for followup visit.

## 2012-02-14 NOTE — Assessment & Plan Note (Signed)
Her blood pressure is well-controlled. She's on Exforge HCT. She'll followup with her medical doctor for this.

## 2012-02-21 ENCOUNTER — Other Ambulatory Visit (HOSPITAL_COMMUNITY): Payer: BC Managed Care – PPO

## 2012-02-21 ENCOUNTER — Ambulatory Visit (HOSPITAL_COMMUNITY): Payer: BC Managed Care – PPO | Attending: Cardiovascular Disease

## 2012-02-21 DIAGNOSIS — I369 Nonrheumatic tricuspid valve disorder, unspecified: Secondary | ICD-10-CM | POA: Insufficient documentation

## 2012-02-21 DIAGNOSIS — I379 Nonrheumatic pulmonary valve disorder, unspecified: Secondary | ICD-10-CM | POA: Insufficient documentation

## 2012-02-21 DIAGNOSIS — I10 Essential (primary) hypertension: Secondary | ICD-10-CM | POA: Insufficient documentation

## 2012-02-21 DIAGNOSIS — I059 Rheumatic mitral valve disease, unspecified: Secondary | ICD-10-CM | POA: Insufficient documentation

## 2012-02-21 DIAGNOSIS — R011 Cardiac murmur, unspecified: Secondary | ICD-10-CM | POA: Insufficient documentation

## 2012-02-21 NOTE — Progress Notes (Signed)
Echocardiogram performed.  

## 2012-05-27 ENCOUNTER — Ambulatory Visit (HOSPITAL_COMMUNITY)
Admission: RE | Admit: 2012-05-27 | Discharge: 2012-05-27 | Disposition: A | Discharge status: Home / Self Care | Payer: BC Managed Care – PPO | Source: Ambulatory Visit | Attending: Internal Medicine | Admitting: Internal Medicine

## 2012-05-27 ENCOUNTER — Other Ambulatory Visit (HOSPITAL_COMMUNITY): Payer: Self-pay | Admitting: Internal Medicine

## 2012-05-27 DIAGNOSIS — IMO0002 Reserved for concepts with insufficient information to code with codable children: Secondary | ICD-10-CM | POA: Insufficient documentation

## 2012-05-27 DIAGNOSIS — I10 Essential (primary) hypertension: Secondary | ICD-10-CM | POA: Insufficient documentation

## 2012-05-27 DIAGNOSIS — M51379 Other intervertebral disc degeneration, lumbosacral region without mention of lumbar back pain or lower extremity pain: Secondary | ICD-10-CM | POA: Insufficient documentation

## 2012-05-27 DIAGNOSIS — Q767 Congenital malformation of sternum: Secondary | ICD-10-CM | POA: Insufficient documentation

## 2012-05-27 DIAGNOSIS — M5137 Other intervertebral disc degeneration, lumbosacral region: Secondary | ICD-10-CM | POA: Insufficient documentation

## 2012-05-27 DIAGNOSIS — R059 Cough, unspecified: Secondary | ICD-10-CM | POA: Insufficient documentation

## 2012-05-27 DIAGNOSIS — R05 Cough: Secondary | ICD-10-CM

## 2012-05-27 DIAGNOSIS — Q766 Other congenital malformations of ribs: Secondary | ICD-10-CM | POA: Insufficient documentation

## 2012-05-27 DIAGNOSIS — M545 Low back pain, unspecified: Secondary | ICD-10-CM | POA: Insufficient documentation

## 2012-05-27 DIAGNOSIS — R0989 Other specified symptoms and signs involving the circulatory and respiratory systems: Secondary | ICD-10-CM | POA: Insufficient documentation

## 2012-05-27 DIAGNOSIS — M549 Dorsalgia, unspecified: Secondary | ICD-10-CM

## 2012-06-25 ENCOUNTER — Encounter: Payer: Self-pay | Admitting: Physician Assistant

## 2012-06-25 ENCOUNTER — Ambulatory Visit (INDEPENDENT_AMBULATORY_CARE_PROVIDER_SITE_OTHER): Payer: BC Managed Care – PPO | Admitting: Physician Assistant

## 2012-06-25 VITALS — BP 122/74 | HR 71 | Ht 70.0 in | Wt 242.2 lb

## 2012-06-25 DIAGNOSIS — I10 Essential (primary) hypertension: Secondary | ICD-10-CM

## 2012-06-25 DIAGNOSIS — R0602 Shortness of breath: Secondary | ICD-10-CM

## 2012-06-25 DIAGNOSIS — I340 Nonrheumatic mitral (valve) insufficiency: Secondary | ICD-10-CM | POA: Insufficient documentation

## 2012-06-25 DIAGNOSIS — I5030 Unspecified diastolic (congestive) heart failure: Secondary | ICD-10-CM | POA: Insufficient documentation

## 2012-06-25 DIAGNOSIS — R0989 Other specified symptoms and signs involving the circulatory and respiratory systems: Secondary | ICD-10-CM

## 2012-06-25 DIAGNOSIS — I5032 Chronic diastolic (congestive) heart failure: Secondary | ICD-10-CM

## 2012-06-25 DIAGNOSIS — I059 Rheumatic mitral valve disease, unspecified: Secondary | ICD-10-CM

## 2012-06-25 LAB — BASIC METABOLIC PANEL
CO2: 27 mEq/L (ref 19–32)
Calcium: 9.6 mg/dL (ref 8.4–10.5)
Creatinine, Ser: 1 mg/dL (ref 0.4–1.2)
Glucose, Bld: 81 mg/dL (ref 70–99)

## 2012-06-25 LAB — BRAIN NATRIURETIC PEPTIDE: Pro B Natriuretic peptide (BNP): 61 pg/mL (ref 0.0–100.0)

## 2012-06-25 MED ORDER — POTASSIUM CHLORIDE ER 10 MEQ PO TBCR: 10.0000 meq | EXTENDED_RELEASE_TABLET | Freq: Every day | ORAL | Status: DC

## 2012-06-25 MED ORDER — AMLODIPINE BESYLATE-VALSARTAN 10-320 MG PO TABS: 1.0000 | ORAL_TABLET | Freq: Every day | ORAL | Status: DC

## 2012-06-25 MED ORDER — FUROSEMIDE 40 MG PO TABS: 40.0000 mg | ORAL_TABLET | Freq: Every day | ORAL | Status: DC

## 2012-06-25 NOTE — Patient Instructions (Addendum)
Your physician wants you to follow-up in: 2 weeks on Thursday, June 19 at 9:50 a.m. with Richardson Dopp, PA   Your physician has recommended you make the following change in your medication:  STOP Exforge HCTZ Take Exforge  10-320 mg 1 pill daily Take Lasix 40 mg 1 pill daily Take KCL (potassium) 10 mEq 1 pill daily        Your physician recommends that you have labs work TODAY:  BNP, BMET  Your physician recommends that you return for lab work in: 1 week for [employer]

## 2012-06-25 NOTE — Progress Notes (Signed)
Yolanda Espinoza. [address], [address] [place], [place]  [address] Phone: [phone] Fax:  [phone]  Date:  06/25/2012   ID:  Yolanda Espinoza, DOB 1955/07/31, MRN [id number]  PCP:  Kelby Aline, Alfonso Patten, PA-C  Cardiologist:  Dr. Liam Rogers     History of Present Illness: Yolanda Espinoza is a 57 y.o. female who is referred back today by her PCP for further evaluation of an enlarged cardiac silhouette on chest x-ray.  She has a hx of HTN, subarachnoid hemorrhage due to cerebral aneurysm s/p coiling in 2003, mitral regurgitation. She was evaluated by Dr. Acie Fredrickson 01/2012 for a heart murmur. Echocardiogram 01/2012 mild LVH, EF A999333, grade 2 diastolic dysfunction, moderate MR, mild LAE, PASP 36.  Over the last few mos, she has noted increased DOE.  She is NYHA Class II-IIb.  She notes 2 pillow orthopnea for several mos.  No PND.  She sleeps with CPAP.  She notes increased LE edema.  This is typically worse in the evenings and better in the mornings.  She is on her feet all day at work.  She denies chest pain or syncope.  She does note a non-productive cough especially at night. She has noted a recent weight gain.  She tells me the CXR was done due to back from [facility].  Labs (7/13):    Cr 0.80 Labs (12/13):  K 4.1, Cr 0.91, Hgb 10.6, LDL 64, ALT 9 Labs (1/14):    Hgb 10.5  CXR 05/27/12:  IMPRESSION:  Borderline enlargement of cardiac silhouette, with increase in size of cardiac silhouette since previous exam.  No acute abnormalities.  Wt Readings from Last 3 Encounters:  06/25/12 242 lb 3.2 oz (109.861 kg)  02/14/12 236 lb (107.049 kg)  12/17/10 229 lb (103.874 kg)     Past Medical History  Diagnosis Date  . Subarachnoid hemorrhage due to ruptured aneurysm     2003 - s/p coiling  . Hypertension may, 1973  . Mitral regurgitation     Echocardiogram 01/2012 mild LVH, EF A999333, grade 2 diastolic dysfunction, moderate MR, mild LAE, PASP 36  . Chronic diastolic heart failure     Current Outpatient  Prescriptions  Medication Sig Dispense Refill  . Amlodipine-Valsartan-HCTZ (EXFORGE HCT) 10-320-25 MG TABS Take 1 tablet by mouth daily.        . Cyanocobalamin (VITAMIN B 12 PO) Take by mouth daily.      Marland Kitchen estrogens, conjugated, (PREMARIN) 0.9 MG tablet Take 0.9 mg by mouth daily.        . fish oil-omega-3 fatty acids 1000 MG capsule Take 2 g by mouth daily.      . Magnesium 500 MG CAPS Take 500 mg by mouth daily.       No current facility-administered medications for this visit.    Allergies:    Allergies  Allergen Reactions  . Aspirin Nausea And Vomiting    Social History:  The patient  reports that she has quit smoking. She does not have any smokeless tobacco history on file. She reports that she does not drink alcohol or use illicit drugs.   ROS:  Please see the history of present illness.      All other systems reviewed and negative.   PHYSICAL EXAM: VS:  BP 122/74  Pulse 71  Ht 5\' 10"  (1.778 m)  Wt 242 lb 3.2 oz (109.861 kg)  BMI 34.75 kg/m2 Well nourished, well developed, in no acute distress HEENT: normal Neck: min elevated JVD Cardiac:  normal S1, S2; RRR; 2/6 systolic murmur at the apex; no S3 Lungs:  clear to auscultation bilaterally, no wheezing, rhonchi or rales Abd: soft, nontender, no hepatomegaly Ext: 1+ bilat ankle edema Skin: warm and dry Neuro:  CNs 2-12 intact, no focal abnormalities noted  EKG:  NSR HR 71, septal Q waves, no change from prior tracing     ASSESSMENT AND PLAN:  1. Cardiomegaly:  Enlargement of cardiac silhouette likely explained by known LVH and mitral regurgitation.  Reviewed prior CXR from 2012 and current Xray with Dr. Darlin Coco ([facility]).   2. Dyspnea:  Suspect she has diastolic CHF and is volume overloaded.  Her neck veins are up slightly, ankles are swollen and her weight is up.  I think she would feel better on furosemide.  Will d/c HCTZ.  Start Lasix 40 mg QD, K+ 10 mEq QD.  Check BMET and BNP today.  Repeat BMET in 1 week.  If  dyspnea does not improve with diuresis, consider stress testing. 3. Hypertension:  Controlled.  Change medications as noted for volume overload. 4. Mitral Regurgitation:  Moderate by most recent assessment.  No reason to think this has worsened.  No need for repeat echo at this time. 5. Disposition:  F/u with me in 2-3 weeks.   Signed, Richardson Dopp, PA-C  06/25/2012 12:36 PM

## 2012-06-26 ENCOUNTER — Telehealth: Payer: Self-pay | Admitting: *Deleted

## 2012-06-26 NOTE — Telephone Encounter (Signed)
pt notified about lab results and to decrease lasix after 5 days which will be 07/01/12 she will decrease lasix to 20 mg daily; pt has repeat bmet 07/02/12. pt verbalized understanding to me about all instructions

## 2012-06-26 NOTE — Telephone Encounter (Signed)
Message copied by Michae Kava on Wed Jun 26, 2012 11:46 AM ------      Message from: [place], [place] T      Created: Tue Jun 25, 2012  5:37 PM       K+ and creatinine ok      Change Lasix to 20 mg QD after 5 days      Richardson Dopp, PA-C        06/25/2012 5:37 PM ------

## 2012-07-02 ENCOUNTER — Other Ambulatory Visit: Payer: BC Managed Care – PPO

## 2012-07-03 ENCOUNTER — Other Ambulatory Visit (INDEPENDENT_AMBULATORY_CARE_PROVIDER_SITE_OTHER): Payer: BC Managed Care – PPO

## 2012-07-03 DIAGNOSIS — I5032 Chronic diastolic (congestive) heart failure: Secondary | ICD-10-CM

## 2012-07-03 DIAGNOSIS — R0602 Shortness of breath: Secondary | ICD-10-CM

## 2012-07-03 DIAGNOSIS — I10 Essential (primary) hypertension: Secondary | ICD-10-CM

## 2012-07-03 LAB — BASIC METABOLIC PANEL
BUN: 25 mg/dL — ABNORMAL HIGH (ref 6–23)
Chloride: 98 mEq/L (ref 96–112)
Creatinine, Ser: 1.1 mg/dL (ref 0.4–1.2)
GFR: 65.9 mL/min (ref >60.00)
Glucose, Bld: 92 mg/dL (ref 70–99)

## 2012-07-03 NOTE — Progress Notes (Signed)
Quick Note:  Preliminary report reviewed by triage nurse and sent to MD desk./PE   ______

## 2012-07-04 ENCOUNTER — Telehealth: Payer: Self-pay | Admitting: *Deleted

## 2012-07-04 NOTE — Telephone Encounter (Signed)
pt notified about lab results with verbal understanding today, no changes to be made

## 2012-07-04 NOTE — Telephone Encounter (Signed)
Message copied by Michae Kava on Thu Jul 04, 2012  9:30 AM ------      Message from: [place], [place] T      Created: Thu Jul 04, 2012  9:04 AM       Potassium and creatinine okay      Continue with current treatment plan.      Richardson Dopp, PA-C        07/04/2012 9:04 AM ------

## 2012-07-09 LAB — FECAL OCCULT BLOOD, GUAIAC: FECAL OCCULT BLD: NEGATIVE

## 2012-07-11 ENCOUNTER — Ambulatory Visit (INDEPENDENT_AMBULATORY_CARE_PROVIDER_SITE_OTHER): Payer: BC Managed Care – PPO | Admitting: Physician Assistant

## 2012-07-11 ENCOUNTER — Encounter: Payer: Self-pay | Admitting: Physician Assistant

## 2012-07-11 VITALS — BP 145/89 | HR 62 | Ht 70.0 in | Wt 241.0 lb

## 2012-07-11 DIAGNOSIS — I5032 Chronic diastolic (congestive) heart failure: Secondary | ICD-10-CM

## 2012-07-11 DIAGNOSIS — I340 Nonrheumatic mitral (valve) insufficiency: Secondary | ICD-10-CM

## 2012-07-11 DIAGNOSIS — R0602 Shortness of breath: Secondary | ICD-10-CM

## 2012-07-11 DIAGNOSIS — R011 Cardiac murmur, unspecified: Secondary | ICD-10-CM

## 2012-07-11 DIAGNOSIS — I059 Rheumatic mitral valve disease, unspecified: Secondary | ICD-10-CM

## 2012-07-11 DIAGNOSIS — I10 Essential (primary) hypertension: Secondary | ICD-10-CM

## 2012-07-11 MED ORDER — FUROSEMIDE 40 MG PO TABS: ORAL_TABLET | ORAL | Status: DC

## 2012-07-11 NOTE — Progress Notes (Signed)
[facility]. [address], [address] [place], [place]  [address] Phone: [phone] Fax:  [phone]  Date:  07/11/2012   ID:  SHANIEL SELMON, DOB 29-Nov-1955, MRN [id number]  PCP:  Kelby Aline, Alfonso Patten, PA-C  Cardiologist:  Dr. Liam Rogers     History of Present Illness: Yolanda Espinoza is a 57 y.o. female who returns for f/u.  She has a hx of HTN, subarachnoid hemorrhage due to cerebral aneurysm s/p coiling in 2003, mitral regurgitation. She was evaluated by Dr. Acie Fredrickson 01/2012 for a heart murmur. Echocardiogram 01/2012 mild LVH, EF A999333, grade 2 diastolic dysfunction, moderate MR, mild LAE, PASP 36.  I saw her recently after a chest x-ray demonstrated an enlarged cardiac silhouette. She described increased dyspnea with exertion, two-pillow orthopnea and increased LE edema. I felt that she was somewhat volume overloaded. I changed her HCTZ to Lasix.  I reviewed her case with Dr. Mare Ferrari ([facility]). We did not feel that she needed a repeat echo at that time.  Since last seen, she is feeling better.  Breathing is improved.  She is NYHA Class 1-2.  She always sleeps on 2 pillows. No PND, edema.  No syncope.  No chest pain.    Labs (7/13):    Cr 0.80 Labs (12/13):  K 4.1, Cr 0.91, Hgb 10.6, LDL 64, ALT 9 Labs (1/14):    Hgb 10.5 Labs (6/14):    K 3.9=>4.8, Cr 1.0=>1.1, proBNP 61    Wt Readings from Last 3 Encounters:  07/11/12 241 lb (109.317 kg)  06/25/12 242 lb 3.2 oz (109.861 kg)  02/14/12 236 lb (107.049 kg)     Past Medical History  Diagnosis Date  . Subarachnoid hemorrhage due to ruptured aneurysm     2003 - s/p coiling  . Hypertension may, 1973  . Mitral regurgitation     Echocardiogram 01/2012 mild LVH, EF A999333, grade 2 diastolic dysfunction, moderate MR, mild LAE, PASP 36  . Chronic diastolic heart failure     Current Outpatient Prescriptions  Medication Sig Dispense Refill  . amLODipine-valsartan (EXFORGE) 10-320 MG per tablet Take 1 tablet by mouth daily.  90 tablet  3  .  Cyanocobalamin (VITAMIN B 12 PO) Take by mouth daily.      Marland Kitchen estrogens, conjugated, (PREMARIN) 0.9 MG tablet Take 0.9 mg by mouth daily.        . fish oil-omega-3 fatty acids 1000 MG capsule Take 2 g by mouth daily.      . furosemide (LASIX) 40 MG tablet Take 1 tablet (40 mg total) by mouth daily.  90 tablet  3  . Magnesium 500 MG CAPS Take 500 mg by mouth daily.      . potassium chloride (K-DUR) 10 MEQ tablet Take 1 tablet (10 mEq total) by mouth daily.  90 tablet  3   No current facility-administered medications for this visit.    Allergies:    Allergies  Allergen Reactions  . Aspirin Nausea And Vomiting    Social History:  The patient  reports that she has quit smoking. She does not have any smokeless tobacco history on file. She reports that she does not drink alcohol or use illicit drugs.   ROS:  Please see the history of present illness.  All other systems reviewed and negative.   PHYSICAL EXAM: VS:  BP 145/89  Pulse 62  Ht 5\' 10"  (1.778 m)  Wt 241 lb (109.317 kg)  BMI 34.58 kg/m2 Well nourished, well developed, in no acute distress  HEENT: normal Neck: no JVD Cardiac:  normal S1, S2; RRR; 2/6 systolic murmur at the apex; no S3 Lungs:  clear to auscultation bilaterally, no wheezing, rhonchi or rales Abd: soft, nontender, no hepatomegaly Ext: no edema Skin: warm and dry Neuro:  CNs 2-12 intact, no focal abnormalities noted  EKG:  NSR, HR 62, no acute changes     ASSESSMENT AND PLAN:  1. Chronic Diastolic CHF:  Likely exacerbated by MV disease.  She felt better on the higher dose of lasix.  Will change dose to Lasix 40 mg QOD alt with 20 mg QOD.  Check BMET in 2 weeks.   2. Hypertension:  Fair control.  Continue to monitor.   3. Mitral Regurgitation:  Moderate by most recent assessment.   4. Disposition:  F/u with me in 3 mos and Dr. Liam Rogers in 01/2013.   Signed, Richardson Dopp, PA-C  07/11/2012 10:22 AM

## 2012-07-11 NOTE — Patient Instructions (Addendum)
YOU HAVE LAB WORK ON 07/25/12 FOR BMET  CHANGE LASIX TO ALTERNATE EVERY OTHER DAY WITH 40 MG ONE DAY, ALT WITH 20 MG NEXT DAY  PLEASE FOLLOW UP WITH [place], [facility] 10/11/12 @ 9:50  Your physician wants you to follow-up in: [place] DR. Acie Fredrickson. You will receive a reminder letter in the mail two months in advance. If you don't receive a letter, please call our office to schedule the follow-up appointment.

## 2012-07-25 ENCOUNTER — Other Ambulatory Visit (INDEPENDENT_AMBULATORY_CARE_PROVIDER_SITE_OTHER): Payer: BC Managed Care – PPO

## 2012-07-25 DIAGNOSIS — I5032 Chronic diastolic (congestive) heart failure: Secondary | ICD-10-CM

## 2012-07-25 LAB — BASIC METABOLIC PANEL
Calcium: 9.7 mg/dL (ref 8.4–10.5)
Creatinine, Ser: 1.1 mg/dL (ref 0.4–1.2)
GFR: 68.03 mL/min (ref >60.00)
Glucose, Bld: 98 mg/dL (ref 70–99)
Sodium: 136 mEq/L (ref 135–145)

## 2012-07-29 ENCOUNTER — Telehealth: Payer: Self-pay | Admitting: *Deleted

## 2012-07-29 NOTE — Telephone Encounter (Signed)
Message copied by Michae Kava on Mon Jul 29, 2012  9:59 AM ------      Message from: [place], [place] T      Created: Fri Jul 26, 2012  7:38 PM       Potassium and kidney function ok      Continue with current treatment plan.      Richardson Dopp, PA-C        07/26/2012 7:38 PM ------

## 2012-07-29 NOTE — Telephone Encounter (Signed)
pt notified about lab results with verbal understanding today.  

## 2012-07-29 NOTE — Telephone Encounter (Signed)
lvm with pt's husband for ptcb go over lab results

## 2012-07-30 ENCOUNTER — Other Ambulatory Visit (HOSPITAL_COMMUNITY): Payer: Self-pay | Admitting: Interventional Radiology

## 2012-07-30 DIAGNOSIS — I729 Aneurysm of unspecified site: Secondary | ICD-10-CM

## 2012-07-31 ENCOUNTER — Other Ambulatory Visit: Payer: Self-pay | Admitting: Radiology

## 2012-08-01 ENCOUNTER — Telehealth (HOSPITAL_COMMUNITY): Payer: Self-pay | Admitting: Interventional Radiology

## 2012-08-01 NOTE — Telephone Encounter (Signed)
Called pt gave her the phone # and reference # from her insurance company. Told her to call them and give them the reference # so that they would pay for 90% of her procedure vs. 70% if she does not call them and give them the reference #. JMichaux

## 2012-08-06 ENCOUNTER — Other Ambulatory Visit (HOSPITAL_COMMUNITY): Payer: Self-pay | Admitting: Interventional Radiology

## 2012-08-06 ENCOUNTER — Encounter (HOSPITAL_COMMUNITY): Payer: Self-pay | Admitting: Pharmacy Technician

## 2012-08-06 ENCOUNTER — Ambulatory Visit (HOSPITAL_COMMUNITY)
Admission: RE | Admit: 2012-08-06 | Discharge: 2012-08-06 | Disposition: A | Discharge status: Home / Self Care | Payer: BC Managed Care – PPO | Source: Ambulatory Visit | Attending: Interventional Radiology | Admitting: Interventional Radiology

## 2012-08-06 ENCOUNTER — Encounter (HOSPITAL_COMMUNITY): Payer: Self-pay

## 2012-08-06 DIAGNOSIS — I729 Aneurysm of unspecified site: Secondary | ICD-10-CM

## 2012-08-06 DIAGNOSIS — I5032 Chronic diastolic (congestive) heart failure: Secondary | ICD-10-CM | POA: Insufficient documentation

## 2012-08-06 DIAGNOSIS — I671 Cerebral aneurysm, nonruptured: Secondary | ICD-10-CM | POA: Insufficient documentation

## 2012-08-06 DIAGNOSIS — I059 Rheumatic mitral valve disease, unspecified: Secondary | ICD-10-CM | POA: Insufficient documentation

## 2012-08-06 DIAGNOSIS — Z87891 Personal history of nicotine dependence: Secondary | ICD-10-CM | POA: Insufficient documentation

## 2012-08-06 DIAGNOSIS — Z9889 Other specified postprocedural states: Secondary | ICD-10-CM | POA: Insufficient documentation

## 2012-08-06 DIAGNOSIS — I509 Heart failure, unspecified: Secondary | ICD-10-CM | POA: Insufficient documentation

## 2012-08-06 DIAGNOSIS — Z886 Allergy status to analgesic agent status: Secondary | ICD-10-CM | POA: Insufficient documentation

## 2012-08-06 DIAGNOSIS — I10 Essential (primary) hypertension: Secondary | ICD-10-CM | POA: Insufficient documentation

## 2012-08-06 LAB — CBC WITH DIFFERENTIAL/PLATELET
Basophils Absolute: 0 10*3/uL (ref 0.0–0.1)
Basophils Relative: 0 % (ref 0–1)
Eosinophils Absolute: 0.2 10*3/uL (ref 0.0–0.7)
Eosinophils Relative: 2 % (ref 0–5)
MCH: 26.8 pg (ref 26.0–34.0)
MCHC: 32.5 g/dL (ref 30.0–36.0)
Neutrophils Relative %: 41 % — ABNORMAL LOW (ref 43–77)
Platelets: 306 10*3/uL (ref 150–400)
RBC: 3.95 MIL/uL (ref 3.87–5.11)
RDW: 13.7 % (ref 11.5–15.5)

## 2012-08-06 LAB — PROTIME-INR
INR: 0.89 (ref 0.00–1.49)
Prothrombin Time: 11.9 seconds (ref 11.6–15.2)

## 2012-08-06 LAB — BASIC METABOLIC PANEL
BUN: 21 mg/dL (ref 6–23)
Calcium: 10.1 mg/dL (ref 8.4–10.5)
Creatinine, Ser: 1.13 mg/dL — ABNORMAL HIGH (ref 0.50–1.10)
GFR calc non Af Amer: 53 mL/min — ABNORMAL LOW (ref >90)
Glucose, Bld: 111 mg/dL — ABNORMAL HIGH (ref 70–99)
Sodium: 135 mEq/L (ref 135–145)

## 2012-08-06 MED ORDER — FENTANYL CITRATE 0.05 MG/ML IJ SOLN
INTRAMUSCULAR | Status: AC | PRN
  Administered 2012-08-06 (×2): 25 ug via INTRAVENOUS

## 2012-08-06 MED ORDER — FENTANYL CITRATE 0.05 MG/ML IJ SOLN
INTRAMUSCULAR | Status: AC
  Filled 2012-08-06: qty 2

## 2012-08-06 MED ORDER — MIDAZOLAM HCL 2 MG/2ML IJ SOLN
INTRAMUSCULAR | Status: AC
  Filled 2012-08-06: qty 2

## 2012-08-06 MED ORDER — HYDRALAZINE HCL 20 MG/ML IJ SOLN
INTRAMUSCULAR | Status: AC
  Filled 2012-08-06: qty 1

## 2012-08-06 MED ORDER — HEPARIN SOD (PORK) LOCK FLUSH 100 UNIT/ML IV SOLN
INTRAVENOUS | Status: AC | PRN
  Administered 2012-08-06: 500 [IU] via INTRAVENOUS

## 2012-08-06 MED ORDER — SODIUM CHLORIDE 0.9 % IV SOLN: Freq: Once | INTRAVENOUS | Status: DC

## 2012-08-06 MED ORDER — IOHEXOL 300 MG/ML  SOLN
150.0000 mL | Freq: Once | INTRAMUSCULAR | Status: AC | PRN
  Administered 2012-08-06: 80 mL via INTRA_ARTERIAL

## 2012-08-06 MED ORDER — MIDAZOLAM HCL 2 MG/2ML IJ SOLN
INTRAMUSCULAR | Status: AC | PRN
  Administered 2012-08-06: 1 mg via INTRAVENOUS

## 2012-08-06 MED ORDER — ACETAMINOPHEN 325 MG PO TABS
650.0000 mg | ORAL_TABLET | Freq: Once | ORAL | Status: AC
  Administered 2012-08-06: 650 mg via ORAL
  Filled 2012-08-06: qty 2

## 2012-08-06 NOTE — H&P (Signed)
Yolanda Espinoza is an 57 y.o. female.   Chief Complaint: Known pt of Dr Estanislado Pandy Hx multiple intracranial aneurysms Previously treated:  L Posterior Communicating artery aneurysm stent/coil 2003; L Anterior cerebral artery aneurysm coil 2004; Known small R Internal carotid artery aneurysm- untreated Scheduled now for follow up cerebral arteriogram Pt asymptomatic; no complaints HPI: Hx SAH; HTN; CHF  Past Medical History  Diagnosis Date  . Subarachnoid hemorrhage due to ruptured aneurysm     2003 - s/p coiling  . Hypertension may, 1973  . Mitral regurgitation     Echocardiogram 01/2012 mild LVH, EF A999333, grade 2 diastolic dysfunction, moderate MR, mild LAE, PASP 36  . Chronic diastolic heart failure     Past Surgical History  Procedure Laterality Date  . Aneurysm coiling    . Head surgery      History reviewed. No pertinent family history. Social History:  reports that she has quit smoking. She does not have any smokeless tobacco history on file. She reports that she does not drink alcohol or use illicit drugs.  Allergies:  Allergies  Allergen Reactions  . Aspirin Nausea And Vomiting     (Not in a hospital admission)  Results for orders placed during the hospital encounter of 08/06/12 (from the past 48 hour(s))  BASIC METABOLIC PANEL     Status: Abnormal   Collection Time    08/06/12  6:51 AM      Result Value Range   Sodium 135  135 - 145 mEq/L   Potassium 4.2  3.5 - 5.1 mEq/L   Comment: HEMOLYSIS AT THIS LEVEL MAY AFFECT RESULT   Chloride 97  96 - 112 mEq/L   CO2 26  19 - 32 mEq/L   Glucose, Bld 111 (*) 70 - 99 mg/dL   BUN 21  6 - 23 mg/dL   Creatinine, Ser 1.13 (*) 0.50 - 1.10 mg/dL   Calcium 10.1  8.4 - 10.5 mg/dL   GFR calc non Af Amer 53 (*) >90 mL/min   GFR calc Af Amer 62 (*) >90 mL/min   Comment:            The eGFR has been calculated     using the CKD EPI equation.     This calculation has not been     validated in all clinical     situations.    eGFR's persistently     <90 mL/min signify     possible Chronic Kidney Disease.   No results found.  Review of Systems  Constitutional: Negative for fever.  HENT:       Occas Rt side headache- comes and goes quickly- takes no meds  Eyes: Negative for blurred vision and photophobia.  Respiratory: Negative for shortness of breath.   Cardiovascular: Negative for chest pain.  Gastrointestinal: Negative for nausea, vomiting and abdominal pain.  Neurological: Positive for headaches. Negative for dizziness, tingling and weakness.  Psychiatric/Behavioral: The patient is not nervous/anxious.     Blood pressure 155/77, pulse 73, temperature 98.3 F (36.8 C), temperature source Oral, resp. rate 20, height 5\' 10"  (1.778 m), weight 242 lb (109.77 kg), SpO2 96.00%. Physical Exam  Constitutional: She is oriented to person, place, and time. She appears well-developed and well-nourished.  HENT:  Head: Atraumatic.  Eyes: EOM are normal.  Neck: Normal range of motion. Neck supple.  Cardiovascular: Normal rate, regular rhythm and normal heart sounds.   No murmur heard. Respiratory: Effort normal and breath sounds normal.  GI: Soft. Bowel  sounds are normal. There is no tenderness.  Musculoskeletal: Normal range of motion.  Neurological: She is alert and oriented to person, place, and time. No cranial nerve deficit.  Skin: Skin is warm and dry.  Psychiatric: She has a normal mood and affect. Her behavior is normal. Judgment and thought content normal.     Assessment/Plan Known intracranial aneurysms previously treated L PCOM and L ACA- coiling/stent Small R ICA aneurysm untreated Doing well Here for follow up cerebral arteriogram Pt aware of procedure benefits and risks and agreeable to proceed Consent signed and in  chart  [name] A 08/06/2012, 7:44 AM

## 2012-08-06 NOTE — ED Notes (Signed)
Requested bed from short stay; spoke with [place]

## 2012-08-06 NOTE — Procedures (Signed)
S/P 4 vessel cerebral arteriogram. RT CFA approach. Findings . 1.Approx 2.29mm x 2.3 mm RT ICA superior hypophyseal aneurys, 2. ?ACOM region 1.5 mm bulge. 3.Obliterated previously treated LT ICA intracranial aneurysms.

## 2012-08-23 ENCOUNTER — Other Ambulatory Visit (HOSPITAL_COMMUNITY): Payer: Self-pay | Admitting: Interventional Radiology

## 2012-08-23 DIAGNOSIS — I729 Aneurysm of unspecified site: Secondary | ICD-10-CM

## 2012-08-28 ENCOUNTER — Ambulatory Visit (HOSPITAL_COMMUNITY)
Admission: RE | Admit: 2012-08-28 | Discharge: 2012-08-28 | Disposition: A | Discharge status: Home / Self Care | Payer: BC Managed Care – PPO | Source: Ambulatory Visit | Attending: Interventional Radiology | Admitting: Interventional Radiology

## 2012-08-28 DIAGNOSIS — I729 Aneurysm of unspecified site: Secondary | ICD-10-CM

## 2012-10-11 ENCOUNTER — Encounter: Payer: Self-pay | Admitting: Physician Assistant

## 2012-10-11 ENCOUNTER — Ambulatory Visit (INDEPENDENT_AMBULATORY_CARE_PROVIDER_SITE_OTHER): Payer: BC Managed Care – PPO | Admitting: Physician Assistant

## 2012-10-11 VITALS — BP 147/79 | HR 63 | Ht 70.0 in | Wt 235.8 lb

## 2012-10-11 DIAGNOSIS — I5032 Chronic diastolic (congestive) heart failure: Secondary | ICD-10-CM

## 2012-10-11 DIAGNOSIS — I10 Essential (primary) hypertension: Secondary | ICD-10-CM

## 2012-10-11 DIAGNOSIS — I059 Rheumatic mitral valve disease, unspecified: Secondary | ICD-10-CM

## 2012-10-11 MED ORDER — METOPROLOL SUCCINATE ER 25 MG PO TB24: 25.0000 mg | ORAL_TABLET | Freq: Every day | ORAL | Status: DC

## 2012-10-11 NOTE — Patient Instructions (Addendum)
START TOPROL XL 25 MG DAILY  OK TO TAKE EXTRA 1/2 LASIX ONE TIME DAILY AS NEEDED FOR EXCESS SWELLING  PLEASE SCHEDULE TO HAVE AN ECHO AFTER 02/20/13 DX 424.0  PLEASE FOLLOW WITH DR. Acie Fredrickson 01/2013

## 2012-10-11 NOTE — Progress Notes (Signed)
[facility]. [address], [address] [place], [place]  [address] Phone: [phone] Fax:  [phone]  Date:  10/11/2012   ID:  Yolanda Espinoza, DOB 1955/12/09, MRN [id number]  PCP:  Kelby Aline, Alfonso Patten, PA-C  Cardiologist:  Dr. Liam Rogers     History of Present Illness: Yolanda Espinoza is a 57 y.o. female who returns for f/u.    She has a hx of HTN, subarachnoid hemorrhage due to cerebral aneurysm s/p coiling in 2003, mitral regurgitation. She was evaluated by Dr. Acie Fredrickson 01/2012 for a heart murmur. Echocardiogram 01/2012 mild LVH, EF A999333, grade 2 diastolic dysfunction, moderate MR, mild LAE, PASP 36.  I saw her a few mos ago after a CXR demonstrated an enlarged cardiac silhouette. She described increased dyspnea with exertion, 2-pillow orthopnea and increased LE edema. I felt that she was somewhat volume overloaded and I changed her HCTZ to Lasix.  I last saw her in 6/14 and she was feeling better.   She notes increased LE edema over the last few days.  She denies worsening DOE.  She is NYHA class II.  No PND. She sleeps on 2 pillows chronically.  No syncope.  She has some atypical (MSK) CP.  No exertional CP.    Labs (7/13):    Cr 0.80 Labs (12/13):  K 4.1, Cr 0.91, Hgb 10.6, LDL 64, ALT 9 Labs (1/14):    Hgb 10.5 Labs (6/14):    K 3.9=>4.8, Cr 1.0=>1.1, proBNP 61  Labs (7/14):    K 4.2, Cr 1.13, Hgb 10.6,   Wt Readings from Last 3 Encounters:  10/11/12 235 lb 12.8 oz (106.958 kg)  08/06/12 242 lb (109.77 kg)  07/11/12 241 lb (109.317 kg)     Past Medical History  Diagnosis Date  . Subarachnoid hemorrhage due to ruptured aneurysm     2003 - s/p coiling  . Hypertension may, 1973  . Mitral regurgitation     Echocardiogram 01/2012 mild LVH, EF A999333, grade 2 diastolic dysfunction, moderate MR, mild LAE, PASP 36  . Chronic diastolic heart failure     Current Outpatient Prescriptions  Medication Sig Dispense Refill  . amLODipine-valsartan (EXFORGE) 10-320 MG per tablet Take 1 tablet  by mouth daily.  90 tablet  3  . Cyanocobalamin (VITAMIN B 12 PO) Take 1,000 mcg by mouth daily.       Marland Kitchen estrogens, conjugated, (PREMARIN) 0.9 MG tablet Take 0.9 mg by mouth daily.        . furosemide (LASIX) 40 MG tablet Take 20-40 mg by mouth daily. Alternate doses every day 20mg -40mg -20mg -40mg  etc.      . Magnesium 250 MG TABS Take 500 mg by mouth daily.      . Omega-3 Fatty Acids (FISH OIL) 1200 MG CAPS Take 1,200 mg by mouth daily.      . potassium chloride (K-DUR) 10 MEQ tablet Take 1 tablet (10 mEq total) by mouth daily.  90 tablet  3   No current facility-administered medications for this visit.    Allergies:    Allergies  Allergen Reactions  . Aspirin Nausea And Vomiting    Social History:  The patient  reports that she has quit smoking. She does not have any smokeless tobacco history on file. She reports that she does not drink alcohol or use illicit drugs.   ROS:  Please see the history of present illness.    All other systems reviewed and negative.   PHYSICAL EXAM: VS:  BP 147/79  Pulse  63  Ht 5\' 10"  (1.778 m)  Wt 235 lb 12.8 oz (106.958 kg)  BMI 33.83 kg/m2 Well nourished, well developed, in no acute distress HEENT: normal Neck: no JVD Cardiac:  normal S1, S2; RRR; 2/6 systolic murmur at the apex; no S3 Lungs:  clear to auscultation bilaterally, no wheezing, rhonchi or rales Abd: soft, nontender, no hepatomegaly Ext: trace to 1+ bilateral ankle edema Skin: warm and dry Neuro:  CNs 2-12 intact, no focal abnormalities noted  EKG:   NSR< HR 64, no changes     ASSESSMENT AND PLAN:  1. Chronic Diastolic CHF:  Likely exacerbated by MV disease.  Volume overall stable.  She notes some increased ankle edema.  She can take an extra 20 mg of Lasix prn.  2. Hypertension:  Borderline control.  Add Toprol XL 25 QD.  3. Mitral Regurgitation:  Moderate by most recent assessment.  Check Echo in 01/2013. 4. Disposition:  F/u with Dr. Liam Rogers in 01/2013.   Signed, Richardson Dopp, PA-C  10/11/2012 10:07 AM

## 2012-10-18 ENCOUNTER — Telehealth (HOSPITAL_COMMUNITY): Payer: Self-pay | Admitting: Interventional Radiology

## 2012-10-18 NOTE — Telephone Encounter (Signed)
Called pt told her that per Dr. Estanislado Pandy she could have her tooth pulled. Asked if she was on Plavix or aspirin, pt states she is not on either. JM

## 2012-10-18 NOTE — Telephone Encounter (Signed)
Pt called wants to know if she can have tooth pulled even though she has an aneurysm, told her I would speak to Dr. Estanislado Pandy and call her back. JM

## 2012-11-25 ENCOUNTER — Other Ambulatory Visit: Payer: Self-pay | Admitting: Internal Medicine

## 2012-11-25 LAB — CBC WITH DIFFERENTIAL/PLATELET
Basophils Absolute: 0 10*3/uL (ref 0.0–0.1)
Basophils Relative: 1 % (ref 0–1)
Eosinophils Absolute: 0.2 10*3/uL (ref 0.0–0.7)
Hemoglobin: 9.7 g/dL — ABNORMAL LOW (ref 12.0–15.0)
MCH: 27.1 pg (ref 26.0–34.0)
MCHC: 32.8 g/dL (ref 30.0–36.0)
Monocytes Relative: 8 % (ref 3–12)
Neutrophils Relative %: 51 % (ref 43–77)
RDW: 15.2 % (ref 11.5–15.5)

## 2012-11-26 LAB — BASIC METABOLIC PANEL WITH GFR
BUN: 17 mg/dL (ref 6–23)
Calcium: 10.4 mg/dL (ref 8.4–10.5)
GFR, Est African American: 65 mL/min
GFR, Est Non African American: 56 mL/min — ABNORMAL LOW
Glucose, Bld: 75 mg/dL (ref 70–99)
Sodium: 142 mEq/L (ref 135–145)

## 2012-11-26 LAB — LIPID PANEL
LDL Cholesterol: 62 mg/dL (ref 0–99)
Triglycerides: 153 mg/dL — ABNORMAL HIGH (ref <150)

## 2012-11-26 LAB — HEMOGLOBIN A1C
Hgb A1c MFr Bld: 5.8 % — ABNORMAL HIGH (ref <5.7)
Mean Plasma Glucose: 120 mg/dL — ABNORMAL HIGH (ref <117)

## 2012-11-26 LAB — HEPATIC FUNCTION PANEL
Alkaline Phosphatase: 50 U/L (ref 39–117)
Indirect Bilirubin: 0.2 mg/dL (ref 0.0–0.9)
Total Bilirubin: 0.3 mg/dL (ref 0.3–1.2)

## 2012-12-20 ENCOUNTER — Other Ambulatory Visit: Payer: Self-pay | Admitting: Emergency Medicine

## 2013-01-09 ENCOUNTER — Other Ambulatory Visit: Payer: Self-pay | Admitting: Emergency Medicine

## 2013-01-09 DIAGNOSIS — Z1231 Encounter for screening mammogram for malignant neoplasm of breast: Secondary | ICD-10-CM

## 2013-01-26 ENCOUNTER — Encounter: Payer: Self-pay | Admitting: Internal Medicine

## 2013-01-26 DIAGNOSIS — Z9989 Dependence on other enabling machines and devices: Secondary | ICD-10-CM | POA: Insufficient documentation

## 2013-01-26 DIAGNOSIS — E785 Hyperlipidemia, unspecified: Secondary | ICD-10-CM | POA: Insufficient documentation

## 2013-01-26 DIAGNOSIS — G4733 Obstructive sleep apnea (adult) (pediatric): Secondary | ICD-10-CM | POA: Insufficient documentation

## 2013-01-26 DIAGNOSIS — E559 Vitamin D deficiency, unspecified: Secondary | ICD-10-CM | POA: Insufficient documentation

## 2013-01-29 ENCOUNTER — Ambulatory Visit: Payer: Self-pay | Admitting: Emergency Medicine

## 2013-01-30 ENCOUNTER — Ambulatory Visit (INDEPENDENT_AMBULATORY_CARE_PROVIDER_SITE_OTHER): Payer: BC Managed Care – PPO | Admitting: Emergency Medicine

## 2013-01-30 ENCOUNTER — Encounter: Payer: Self-pay | Admitting: Emergency Medicine

## 2013-01-30 VITALS — BP 138/80 | HR 62 | Temp 98.2°F | Resp 18 | Ht 70.0 in | Wt 227.0 lb

## 2013-01-30 DIAGNOSIS — D649 Anemia, unspecified: Secondary | ICD-10-CM

## 2013-01-30 DIAGNOSIS — I10 Essential (primary) hypertension: Secondary | ICD-10-CM

## 2013-01-30 LAB — CBC WITH DIFFERENTIAL/PLATELET
BASOS ABS: 0 10*3/uL (ref 0.0–0.1)
Basophils Relative: 0 % (ref 0–1)
EOS PCT: 2 % (ref 0–5)
Eosinophils Absolute: 0.1 10*3/uL (ref 0.0–0.7)
HEMATOCRIT: 28.8 % — AB (ref 36.0–46.0)
HEMOGLOBIN: 9.4 g/dL — AB (ref 12.0–15.0)
LYMPHS ABS: 2.7 10*3/uL (ref 0.7–4.0)
LYMPHS PCT: 35 % (ref 12–46)
MCH: 26.9 pg (ref 26.0–34.0)
MCHC: 32.6 g/dL (ref 30.0–36.0)
MCV: 82.3 fL (ref 78.0–100.0)
MONO ABS: 0.5 10*3/uL (ref 0.1–1.0)
MONOS PCT: 6 % (ref 3–12)
NEUTROS ABS: 4.3 10*3/uL (ref 1.7–7.7)
Neutrophils Relative %: 57 % (ref 43–77)
Platelets: 289 10*3/uL (ref 150–400)
RBC: 3.5 MIL/uL — AB (ref 3.87–5.11)
RDW: 15.5 % (ref 11.5–15.5)
WBC: 7.7 10*3/uL (ref 4.0–10.5)

## 2013-01-30 LAB — IRON AND TIBC
%SAT: 13 % — AB (ref 20–55)
Iron: 45 ug/dL (ref 42–145)
TIBC: 342 ug/dL (ref 250–470)
UIBC: 297 ug/dL (ref 125–400)

## 2013-01-30 NOTE — Patient Instructions (Signed)
Iron Deficiency Anemia  Anemia is when you have a low number of healthy red blood cells. HOME CARE   Ask your doctor or dietician what foods you should eat.  Take iron and vitamins as told by your doctor.  Eat foods that have iron in them. This includes liver, lean beef, whole-grain bread, eggs, dried fruit, and dark green leafy vegetables. GET HELP RIGHT AWAY IF:  You pass out (faint).  You have chest pain.  You feel sick to your stomach (nauseous) or throw up (vomit).  You get very short of breath with activity.  You are weak.  You are thirstier than normal.  You have a fast heartbeat.  You start to sweat or become lightheaded when getting up from a chair or bed. MAKE SURE YOU:  Understand these instructions.  Will watch your condition.  Will get help right away if you are not doing well or get worse. Document Released: 02/11/2010 Document Revised: 04/03/2011 Document Reviewed: 02/11/2010 [facility] Patient Information 2014 [place], [place].

## 2013-01-30 NOTE — Progress Notes (Signed)
Subjective:    Patient ID: Yolanda Espinoza, female    DOB: [birth year], 58 y.o.   MRN: [id number]  HPI Comments: 58 yo female for HTN and anemia f/u BP at home is 120s/ 70s. Patient did not f/u with Dr Collene Mares AD with low CBC and polyp HX. She notes mild fatigue. She has not changed her diet or added any supplements. She denies any other SX.  Hypertension   Current Outpatient Prescriptions on File Prior to Visit  Medication Sig Dispense Refill  . amLODipine-valsartan (EXFORGE) 10-320 MG per tablet Take 1 tablet by mouth daily.  90 tablet  3  . Cyanocobalamin (VITAMIN B 12 PO) Take 1,000 mcg by mouth daily.       . furosemide (LASIX) 40 MG tablet Take 20-40 mg by mouth daily. Alternate doses every day 20mg -40mg -20mg -40mg  etc.      . Magnesium 250 MG TABS Take 500 mg by mouth daily.      . metoprolol succinate (TOPROL-XL) 25 MG 24 hr tablet Take 1 tablet (25 mg total) by mouth daily.  30 tablet  11  . Omega-3 Fatty Acids (FISH OIL) 1200 MG CAPS Take 1,200 mg by mouth daily.      . potassium chloride (K-DUR) 10 MEQ tablet Take 1 tablet (10 mEq total) by mouth daily.  90 tablet  3  . PREMARIN 0.9 MG tablet TAKE ONE TABLET BY MOUTH ONCE DAILY ( NEEDS OFFICE VISIT)  30 tablet  6   No current facility-administered medications on file prior to visit.   ALLERGIES Aspirin and Ace inhibitors  Past Medical History  Diagnosis Date  . Subarachnoid hemorrhage due to ruptured aneurysm     2003 - s/p coiling  . Hypertension may, 1973  . Mitral regurgitation     Echocardiogram 01/2012 mild LVH, EF A999333, grade 2 diastolic dysfunction, moderate MR, mild LAE, PASP 36  . Chronic diastolic heart failure   . Hyperlipidemia   . OSA on CPAP   . Unspecified vitamin D deficiency       Review of Systems  Constitutional: Positive for fatigue.  All other systems reviewed and are negative.   BP 138/80  Pulse 62  Temp(Src) 98.2 F (36.8 C) (Temporal)  Resp 18  Ht 5\' 10"  (1.778 m)  Wt 227 lb (102.967  kg)  BMI 32.57 kg/m2     Objective:   Physical Exam  Nursing note and vitals reviewed. Constitutional: She is oriented to person, place, and time. She appears well-developed and well-nourished. No distress.  HENT:  Head: Normocephalic and atraumatic.  Right Ear: External ear normal.  Left Ear: External ear normal.  Nose: Nose normal.  Mouth/Throat: Oropharynx is clear and moist. No oropharyngeal exudate.  Eyes: Conjunctivae and EOM are normal.  Neck: Normal range of motion. Neck supple. No JVD present. No thyromegaly present.  Cardiovascular: Normal rate, regular rhythm, normal heart sounds and intact distal pulses.   Pulmonary/Chest: Effort normal and breath sounds normal.  Abdominal: Soft. Bowel sounds are normal. She exhibits no distension. There is no tenderness.  Musculoskeletal: Normal range of motion. She exhibits no edema and no tenderness.  Lymphadenopathy:    She has no cervical adenopathy.  Neurological: She is alert and oriented to person, place, and time. No cranial nerve deficit.  Skin: Skin is warm and dry. No rash noted. No erythema. No pallor.  Psychiatric: She has a normal mood and affect. Her behavior is normal. Judgment and thought content normal.  Assessment & Plan:  1. HTN- Check BP call if >130/80, increase cardio 2. Anemia- Recheck labs, take Vitamins AD increase green leafy veggies, increase H2O, w/c if any symptom increase.

## 2013-01-31 ENCOUNTER — Telehealth: Payer: Self-pay | Admitting: *Deleted

## 2013-01-31 NOTE — Telephone Encounter (Signed)
Message copied by Bradly Bienenstock A on Fri Jan 31, 2013  9:48 AM ------      Message from: [place], [place] R      Created: Fri Jan 31, 2013  8:40 AM       Please FWD labs to Dr. Collene Mares please advise patient still low and needs GI evaluation. She was supposed to call for appointment. ------

## 2013-01-31 NOTE — Telephone Encounter (Signed)
Spoke with patient about recent lab results and instructions. Faxed labs to Dr. Collene Mares for further evaluation.

## 2013-02-10 ENCOUNTER — Ambulatory Visit (HOSPITAL_COMMUNITY)
Admission: RE | Admit: 2013-02-10 | Discharge: 2013-02-10 | Disposition: A | Discharge status: Home / Self Care | Payer: BC Managed Care – PPO | Source: Ambulatory Visit | Attending: Emergency Medicine | Admitting: Emergency Medicine

## 2013-02-10 DIAGNOSIS — Z1231 Encounter for screening mammogram for malignant neoplasm of breast: Secondary | ICD-10-CM | POA: Insufficient documentation

## 2013-02-26 ENCOUNTER — Ambulatory Visit (HOSPITAL_COMMUNITY): Payer: BC Managed Care – PPO | Attending: Cardiology | Admitting: Radiology

## 2013-02-26 ENCOUNTER — Encounter: Payer: Self-pay | Admitting: Physician Assistant

## 2013-02-26 DIAGNOSIS — I10 Essential (primary) hypertension: Secondary | ICD-10-CM | POA: Insufficient documentation

## 2013-02-26 DIAGNOSIS — R011 Cardiac murmur, unspecified: Secondary | ICD-10-CM | POA: Diagnosis not present

## 2013-02-26 DIAGNOSIS — G4733 Obstructive sleep apnea (adult) (pediatric): Secondary | ICD-10-CM | POA: Diagnosis not present

## 2013-02-26 DIAGNOSIS — Z87891 Personal history of nicotine dependence: Secondary | ICD-10-CM | POA: Diagnosis not present

## 2013-02-26 DIAGNOSIS — I079 Rheumatic tricuspid valve disease, unspecified: Secondary | ICD-10-CM | POA: Insufficient documentation

## 2013-02-26 DIAGNOSIS — I059 Rheumatic mitral valve disease, unspecified: Secondary | ICD-10-CM | POA: Diagnosis present

## 2013-02-26 NOTE — Progress Notes (Signed)
Echocardiogram performed.  

## 2013-03-04 ENCOUNTER — Other Ambulatory Visit (HOSPITAL_COMMUNITY): Payer: Self-pay | Admitting: Interventional Radiology

## 2013-03-04 DIAGNOSIS — I729 Aneurysm of unspecified site: Secondary | ICD-10-CM

## 2013-03-05 ENCOUNTER — Encounter: Payer: Self-pay | Admitting: Cardiovascular Disease

## 2013-03-05 ENCOUNTER — Ambulatory Visit (INDEPENDENT_AMBULATORY_CARE_PROVIDER_SITE_OTHER): Payer: BC Managed Care – PPO | Admitting: Cardiovascular Disease

## 2013-03-05 VITALS — BP 114/80 | HR 50 | Ht 70.0 in | Wt 226.0 lb

## 2013-03-05 DIAGNOSIS — I10 Essential (primary) hypertension: Secondary | ICD-10-CM

## 2013-03-05 DIAGNOSIS — I340 Nonrheumatic mitral (valve) insufficiency: Secondary | ICD-10-CM

## 2013-03-05 DIAGNOSIS — I059 Rheumatic mitral valve disease, unspecified: Secondary | ICD-10-CM

## 2013-03-05 LAB — BASIC METABOLIC PANEL
BUN: 23 mg/dL (ref 6–23)
CHLORIDE: 102 meq/L (ref 96–112)
CO2: 27 mEq/L (ref 19–32)
CREATININE: 1 mg/dL (ref 0.4–1.2)
Calcium: 9.3 mg/dL (ref 8.4–10.5)
GFR: 72.55 mL/min (ref >60.00)
Glucose, Bld: 83 mg/dL (ref 70–99)
POTASSIUM: 4.5 meq/L (ref 3.5–5.1)
Sodium: 138 mEq/L (ref 135–145)

## 2013-03-05 NOTE — Progress Notes (Signed)
Yolanda Espinoza Date of Birth  1955/02/28       [facility] [id number], [address], [place], [place]  [address]   [place],   [address] [phone]     [phone]   Fax  [phone]    Fax [phone]  Problem List: 1. hypertension 2. Heart murmur 3. Cerebral aneurysm  History of Present Illness:  Yolanda Espinoza is a 58 year old female with a history of hypertension and a cerebral aneurysm. She was to have a heart murmur and was referred here for further evaluation.  She also has been having some shortness of breath. She exercises for 30 minutes every day typically does not have much shortness breath when she is exercising. She seems to have shortness of breath at other times.  She denies any syncope or presyncope. She denies any chest pain.  She works at [employer]. She does also helps the seniors with activities.  Hx of blood pressure on occasion. Her blood pressure readings have been normal.  Current Outpatient Prescriptions on File Prior to Visit  Medication Sig Dispense Refill  . amLODipine-valsartan (EXFORGE) 10-320 MG per tablet Take 1 tablet by mouth daily.  90 tablet  3  . Cyanocobalamin (VITAMIN B 12 PO) Take 1,000 mcg by mouth daily.       . furosemide (LASIX) 40 MG tablet Take 20-40 mg by mouth daily. Alternate doses every day 20mg -40mg -20mg -40mg  etc.      . Magnesium 250 MG TABS Take 500 mg by mouth daily.      . metoprolol succinate (TOPROL-XL) 25 MG 24 hr tablet Take 1 tablet (25 mg total) by mouth daily.  30 tablet  11  . Omega-3 Fatty Acids (FISH OIL) 1200 MG CAPS Take 1,200 mg by mouth daily.      . potassium chloride (K-DUR) 10 MEQ tablet Take 1 tablet (10 mEq total) by mouth daily.  90 tablet  3  . PREMARIN 0.9 MG tablet TAKE ONE TABLET BY MOUTH ONCE DAILY ( NEEDS OFFICE VISIT)  30 tablet  6   No current facility-administered medications on file prior to visit.    Allergies    Allergen Reactions  . Aspirin Nausea And Vomiting  . Ace Inhibitors     Past Medical History  Diagnosis Date  . Subarachnoid hemorrhage due to ruptured aneurysm     2003 - s/p coiling  . Hypertension may, 1973  . Mitral regurgitation     a. Echo 01/2012 mild LVH, EF 55-65%, Gr 2 DD, mod MR, mild LAE, PASP 36;  b. Echo (02/2013):  EF 55-60%, Gr 1 DD, mild to mod MR, mild LAE (no sig change since prior echo)  . Chronic diastolic heart failure   . Hyperlipidemia   . OSA on CPAP   . Unspecified vitamin D deficiency     Past Surgical History  Procedure Laterality Date  . Aneurysm coiling    . Head surgery    . Abdominal hysterectomy    . Tonsillectomy and adenoidectomy      History  Smoking status  . Former Smoker  . Quit date: 01/24/1999  Smokeless tobacco  . Not on file    History  Alcohol Use No    Family History  Problem Relation Age of Onset  . Hypertension Father   . Kidney disease Father   . Hypertension Sister   . Hypertension Sister   . Hypertension Sister  Reviw of Systems:  Reviewed in the HPI.  All other systems are negative.  Physical Exam: Blood pressure 114/80, pulse 50, height 5\' 10"  (1.778 m), weight 226 lb (102.513 kg). General: Well developed, well nourished, in no acute distress.  Head: Normocephalic, atraumatic, sclera non-icteric, mucus membranes are moist,   Neck: Supple. Carotids are 2 + without bruits. No JVD   Lungs: Clear   Heart: Regular rate, normal S1 and S2. She has a 2/6 systolic murmur at the upper left sternal border.  Abdomen: Soft, non-tender, non-distended with normal bowel sounds.  Msk:  Strength and tone are normal   Extremities: No clubbing or cyanosis. No edema.  Distal pedal pulses are 2+ and equal    Neuro: CN II - XII intact.  Alert and oriented X 3.   Psych:  Normal   ECG: February 14, 2012:  Normal sinus rhythm at 63 beats a minute.  Assessment / Plan:

## 2013-03-05 NOTE — Assessment & Plan Note (Signed)
Stable

## 2013-03-05 NOTE — Assessment & Plan Note (Signed)
Yolanda Espinoza is doing very well. Blood pressure remains well-controlled. We'll continue with her same medications. I have encouraged her to exercise on a regular basis.

## 2013-03-05 NOTE — Patient Instructions (Signed)
Your physician wants you to follow-up in: [place] will receive a reminder letter in the mail two months in advance. If you don't receive a letter, please call our office to schedule the follow-up appointment.

## 2013-03-06 ENCOUNTER — Other Ambulatory Visit: Payer: Self-pay | Admitting: Radiology

## 2013-03-06 ENCOUNTER — Telehealth: Payer: Self-pay | Admitting: Cardiovascular Disease

## 2013-03-06 NOTE — Telephone Encounter (Signed)
New Message  Pt returned call for results // Please call

## 2013-03-06 NOTE — Telephone Encounter (Signed)
Returned pt call & received no answer. Also voice mailbox not set up Horton Chin RN

## 2013-03-07 ENCOUNTER — Encounter (HOSPITAL_COMMUNITY): Payer: Self-pay | Admitting: Pharmacy Technician

## 2013-03-10 ENCOUNTER — Ambulatory Visit (INDEPENDENT_AMBULATORY_CARE_PROVIDER_SITE_OTHER): Payer: BC Managed Care – PPO | Admitting: Emergency Medicine

## 2013-03-10 ENCOUNTER — Encounter: Payer: Self-pay | Admitting: Emergency Medicine

## 2013-03-10 VITALS — BP 124/70 | HR 76 | Temp 98.2°F | Resp 20 | Ht 70.0 in | Wt 230.0 lb

## 2013-03-10 DIAGNOSIS — J309 Allergic rhinitis, unspecified: Secondary | ICD-10-CM

## 2013-03-10 DIAGNOSIS — J329 Chronic sinusitis, unspecified: Secondary | ICD-10-CM

## 2013-03-10 DIAGNOSIS — J029 Acute pharyngitis, unspecified: Secondary | ICD-10-CM

## 2013-03-10 MED ORDER — AZITHROMYCIN 250 MG PO TABS: ORAL_TABLET | ORAL | Status: AC

## 2013-03-10 MED ORDER — PREDNISONE 10 MG PO TABS: ORAL_TABLET | ORAL | Status: DC

## 2013-03-10 NOTE — Patient Instructions (Signed)
Sore Throat Warm salt water gargles daily. 1 tsp liquid benadryl + 1 tsp liquid Maalox, MIX/ GARGLE/ SPIT as needed for pain  A sore throat is a painful, burning, sore, or scratchy feeling of the throat. There may be pain or tenderness when swallowing or talking. You may have other symptoms with a sore throat. These include coughing, sneezing, fever, or a swollen neck. A sore throat is often the first sign of another sickness. These sicknesses may include a cold, flu, strep throat, or an infection called mono. Most sore throats go away without medical treatment.  HOME CARE   Only take medicine as told by your doctor.  Drink enough fluids to keep your pee (urine) clear or pale yellow.  Rest as needed.  Try using throat sprays, lozenges, or suck on hard candy (if older than 4 years or as told).  Sip warm liquids, such as broth, herbal tea, or warm water with honey. Try sucking on frozen ice pops or drinking cold liquids.  Rinse the mouth (gargle) with salt water. Mix 1 teaspoon salt with 8 ounces of water.  Do not smoke. Avoid being around others when they are smoking.  Put a humidifier in your bedroom at night to moisten the air. You can also turn on a hot shower and sit in the bathroom for 5 10 minutes. Be sure the bathroom door is closed. GET HELP RIGHT AWAY IF:   You have trouble breathing.  You cannot swallow fluids, soft foods, or your spit (saliva).  You have more puffiness (swelling) in the throat.  Your sore throat does not get better in 7 days.  You feel sick to your stomach (nauseous) and throw up (vomit).  You have a fever or lasting symptoms for more than 2 3 days.  You have a fever and your symptoms suddenly get worse. MAKE SURE YOU:   Understand these instructions.  Will watch your condition.  Will get help right away if you are not doing well or get worse. Document Released: 10/19/2007 Document Revised: 10/04/2011 Document Reviewed: 09/17/2011 [facility]  Patient Information 2014 [place], [place].

## 2013-03-10 NOTE — Telephone Encounter (Signed)
REVIEWED PT'S  RECORDS  NOT SURE  WHAT  RESULTS,  APPEARS  LAST  TEST   THAT  WAS  DONE WAS ECHO  AND  RESULTS WERE GIVEN./CY

## 2013-03-10 NOTE — Progress Notes (Signed)
Subjective:    Patient ID: Yolanda Espinoza, female    DOB: [birth year], 58 y.o.   MRN: [id number]  HPI Comments: 58 yo with ST x 2 day. She has been having increased aches, feverish, congested. She has been exposed to sick people at work. She denies relief Ibuprofen.   Headache  Associated symptoms include coughing, a fever, sinus pressure and a sore throat.  Cough Associated symptoms include a fever, headaches, postnasal drip and a sore throat.   Current Outpatient Prescriptions on File Prior to Visit  Medication Sig Dispense Refill  . amLODipine-valsartan (EXFORGE) 10-320 MG per tablet Take 1 tablet by mouth daily.  90 tablet  3  . furosemide (LASIX) 40 MG tablet Take 20-40 mg by mouth daily. Alternate doses every day 20mg -40mg -20mg -40mg  etc.      . Magnesium 250 MG TABS Take 500 mg by mouth daily.      . metoprolol succinate (TOPROL-XL) 25 MG 24 hr tablet Take 1 tablet (25 mg total) by mouth daily.  30 tablet  11  . Omega-3 Fatty Acids (FISH OIL) 1200 MG CAPS Take 1,200 mg by mouth daily.      . potassium chloride (K-DUR) 10 MEQ tablet Take 1 tablet (10 mEq total) by mouth daily.  90 tablet  3   No current facility-administered medications on file prior to visit.    Past Medical History  Diagnosis Date  . Subarachnoid hemorrhage due to ruptured aneurysm     2003 - s/p coiling  . Hypertension may, 1973  . Mitral regurgitation     a. Echo 01/2012 mild LVH, EF 55-65%, Gr 2 DD, mod MR, mild LAE, PASP 36;  b. Echo (02/2013):  EF 55-60%, Gr 1 DD, mild to mod MR, mild LAE (no sig change since prior echo)  . Chronic diastolic heart failure   . Hyperlipidemia   . OSA on CPAP   . Unspecified vitamin D deficiency      Review of Systems  Constitutional: Positive for fever and fatigue.  HENT: Positive for congestion, postnasal drip, sinus pressure and sore throat.   Respiratory: Positive for cough.   Musculoskeletal: Positive for arthralgias.  Neurological: Positive for headaches.  All  other systems reviewed and are negative.  BP 124/70  Pulse 76  Temp(Src) 98.2 F (36.8 C) (Temporal)  Resp 20  Ht 5\' 10"  (1.778 m)  Wt 230 lb (104.327 kg)  BMI 33.00 kg/m2      Objective:   Physical Exam  Nursing note and vitals reviewed. Constitutional: She is oriented to person, place, and time. She appears well-developed and well-nourished.  HENT:  Head: Normocephalic and atraumatic.  Right Ear: External ear normal.  Left Ear: External ear normal.  Nose: Nose normal.  Mouth/Throat: Oropharynx is clear and moist.  Bilateral TMs injected Post Pharynx with erythema and yellow exudate  Eyes: Conjunctivae and EOM are normal.  Neck: Normal range of motion.  Cardiovascular: Normal rate, regular rhythm, normal heart sounds and intact distal pulses.   Pulmonary/Chest: Effort normal and breath sounds normal.  Musculoskeletal: Normal range of motion.  Lymphadenopathy:    She has no cervical adenopathy.  Neurological: She is alert and oriented to person, place, and time.  Skin: Skin is warm and dry.  Psychiatric: She has a normal mood and affect. Judgment normal.          Assessment & Plan:  Sinusitis/ Pharyngitis/ Allergic rhinitis- Allegra OTC, increase H2o, allergy hygiene explained. ZPAK, Pred DP 10 mg Both AD  Warm salt water gargles daily. 1 tsp liquid benadryl + 1 tsp liquid Maalox, MIX/ GARGLE/ SPIT as needed for pain

## 2013-03-12 ENCOUNTER — Other Ambulatory Visit (HOSPITAL_COMMUNITY): Payer: Self-pay | Admitting: Interventional Radiology

## 2013-03-12 ENCOUNTER — Encounter (HOSPITAL_COMMUNITY): Payer: Self-pay

## 2013-03-12 ENCOUNTER — Ambulatory Visit (HOSPITAL_COMMUNITY)
Admission: RE | Admit: 2013-03-12 | Discharge: 2013-03-12 | Disposition: A | Discharge status: Home / Self Care | Payer: BC Managed Care – PPO | Source: Ambulatory Visit | Attending: Interventional Radiology | Admitting: Interventional Radiology

## 2013-03-12 DIAGNOSIS — Z886 Allergy status to analgesic agent status: Secondary | ICD-10-CM | POA: Insufficient documentation

## 2013-03-12 DIAGNOSIS — E559 Vitamin D deficiency, unspecified: Secondary | ICD-10-CM | POA: Insufficient documentation

## 2013-03-12 DIAGNOSIS — I059 Rheumatic mitral valve disease, unspecified: Secondary | ICD-10-CM | POA: Insufficient documentation

## 2013-03-12 DIAGNOSIS — I509 Heart failure, unspecified: Secondary | ICD-10-CM | POA: Insufficient documentation

## 2013-03-12 DIAGNOSIS — Z87891 Personal history of nicotine dependence: Secondary | ICD-10-CM | POA: Insufficient documentation

## 2013-03-12 DIAGNOSIS — I729 Aneurysm of unspecified site: Secondary | ICD-10-CM

## 2013-03-12 DIAGNOSIS — G4733 Obstructive sleep apnea (adult) (pediatric): Secondary | ICD-10-CM | POA: Insufficient documentation

## 2013-03-12 DIAGNOSIS — I5032 Chronic diastolic (congestive) heart failure: Secondary | ICD-10-CM | POA: Insufficient documentation

## 2013-03-12 DIAGNOSIS — E785 Hyperlipidemia, unspecified: Secondary | ICD-10-CM | POA: Insufficient documentation

## 2013-03-12 DIAGNOSIS — I671 Cerebral aneurysm, nonruptured: Secondary | ICD-10-CM | POA: Insufficient documentation

## 2013-03-12 DIAGNOSIS — I10 Essential (primary) hypertension: Secondary | ICD-10-CM | POA: Insufficient documentation

## 2013-03-12 LAB — BASIC METABOLIC PANEL
BUN: 31 mg/dL — AB (ref 6–23)
CHLORIDE: 102 meq/L (ref 96–112)
CO2: 25 meq/L (ref 19–32)
CREATININE: 1.02 mg/dL (ref 0.50–1.10)
Calcium: 10.7 mg/dL — ABNORMAL HIGH (ref 8.4–10.5)
GFR calc Af Amer: 69 mL/min — ABNORMAL LOW (ref >90)
GFR calc non Af Amer: 60 mL/min — ABNORMAL LOW (ref >90)
GLUCOSE: 118 mg/dL — AB (ref 70–99)
Potassium: 4.6 mEq/L (ref 3.7–5.3)
Sodium: 138 mEq/L (ref 137–147)

## 2013-03-12 LAB — CBC
HEMATOCRIT: 32.7 % — AB (ref 36.0–46.0)
Hemoglobin: 10.8 g/dL — ABNORMAL LOW (ref 12.0–15.0)
MCH: 28.1 pg (ref 26.0–34.0)
MCHC: 33 g/dL (ref 30.0–36.0)
MCV: 85.2 fL (ref 78.0–100.0)
Platelets: 292 10*3/uL (ref 150–400)
RBC: 3.84 MIL/uL — ABNORMAL LOW (ref 3.87–5.11)
RDW: 14.9 % (ref 11.5–15.5)
WBC: 13.3 10*3/uL — AB (ref 4.0–10.5)

## 2013-03-12 LAB — PROTIME-INR
INR: 0.88 (ref 0.00–1.49)
Prothrombin Time: 11.8 seconds (ref 11.6–15.2)

## 2013-03-12 LAB — APTT: aPTT: 30 seconds (ref 24–37)

## 2013-03-12 MED ORDER — SODIUM CHLORIDE 0.9 % IV SOLN
INTRAVENOUS | Status: DC
  Administered 2013-03-12: 06:00:00 via INTRAVENOUS

## 2013-03-12 MED ORDER — SODIUM CHLORIDE 0.9 % IV SOLN: INTRAVENOUS | Status: AC

## 2013-03-12 MED ORDER — MIDAZOLAM HCL 2 MG/2ML IJ SOLN
INTRAMUSCULAR | Status: AC | PRN
  Administered 2013-03-12 (×2): 1 mg via INTRAVENOUS

## 2013-03-12 MED ORDER — CEFAZOLIN SODIUM-DEXTROSE 2-3 GM-% IV SOLR
2.0000 g | Freq: Once | INTRAVENOUS | Status: AC
  Administered 2013-03-12: 2 g via INTRAVENOUS

## 2013-03-12 MED ORDER — IOHEXOL 300 MG/ML  SOLN
150.0000 mL | Freq: Once | INTRAMUSCULAR | Status: AC | PRN
  Administered 2013-03-12: 65 mL via INTRA_ARTERIAL

## 2013-03-12 MED ORDER — FENTANYL CITRATE 0.05 MG/ML IJ SOLN
INTRAMUSCULAR | Status: AC
Start: 2013-03-12 — End: 2013-03-12
  Filled 2013-03-12: qty 4

## 2013-03-12 MED ORDER — CEFAZOLIN SODIUM-DEXTROSE 2-3 GM-% IV SOLR
INTRAVENOUS | Status: AC
  Filled 2013-03-12: qty 50

## 2013-03-12 MED ORDER — MIDAZOLAM HCL 2 MG/2ML IJ SOLN
INTRAMUSCULAR | Status: AC
  Filled 2013-03-12: qty 4

## 2013-03-12 MED ORDER — HEPARIN SODIUM (PORCINE) 1000 UNIT/ML IJ SOLN
INTRAMUSCULAR | Status: AC | PRN
  Administered 2013-03-12: 1000 [IU] via INTRAVENOUS

## 2013-03-12 MED ORDER — FENTANYL CITRATE 0.05 MG/ML IJ SOLN
INTRAMUSCULAR | Status: AC | PRN
  Administered 2013-03-12 (×2): 25 ug via INTRAVENOUS

## 2013-03-12 NOTE — ED Notes (Signed)
MD at bedside. 

## 2013-03-12 NOTE — H&P (Signed)
Yolanda Espinoza is an 58 y.o. female.   Chief Complaint: Previous stent/coiling of L posterior communicating artery aneurysm 2003; Stent/coiling of L anterior cerebral artery aneurysm 2004. Known R Internal carotid artery aneurysm- small and stable - most recent MRI 07/2012 New R anterior cerebral artery aneurysm noted on MRI 07/2012 Scheduled now for cerebral arteriogram for evaluation and follow up of these. Pt asymptomatic except for new onset Rt headaches off/on x 2 months  Pt has seen PCP 2/16 for sinusitis; pharyngitis; cough-- Day 2 of Zpak and Prednisone--sxs resolving; feels better;afeb  HPI: SAH- ruptured aneurysm 2003; multiple cerebral aneurysms; HTN; CHF; HLD; OSA(cpap)  Past Medical History  Diagnosis Date  . Subarachnoid hemorrhage due to ruptured aneurysm     2003 - s/p coiling  . Hypertension may, 1973  . Mitral regurgitation     a. Echo 01/2012 mild LVH, EF 55-65%, Gr 2 DD, mod MR, mild LAE, PASP 36;  b. Echo (02/2013):  EF 55-60%, Gr 1 DD, mild to mod MR, mild LAE (no sig change since prior echo)  . Chronic diastolic heart failure   . Hyperlipidemia   . OSA on CPAP   . Unspecified vitamin D deficiency     Past Surgical History  Procedure Laterality Date  . Aneurysm coiling    . Head surgery    . Abdominal hysterectomy    . Tonsillectomy and adenoidectomy      Family History  Problem Relation Age of Onset  . Hypertension Father   . Kidney disease Father   . Hypertension Sister   . Hypertension Sister   . Hypertension Sister    Social History:  reports that she quit smoking about 14 years ago. She does not have any smokeless tobacco history on file. She reports that she does not drink alcohol or use illicit drugs.  Allergies:  Allergies  Allergen Reactions  . Aspirin Nausea And Vomiting  . Ace Inhibitors      (Not in a hospital admission)  Results for orders placed during the hospital encounter of 03/12/13 (from the past 48 hour(s))  APTT     Status:  None   Collection Time    03/12/13  6:09 AM      Result Value Ref Range   aPTT 30  24 - 37 seconds  BASIC METABOLIC PANEL     Status: Abnormal   Collection Time    03/12/13  6:09 AM      Result Value Ref Range   Sodium 138  137 - 147 mEq/L   Potassium 4.6  3.7 - 5.3 mEq/L   Chloride 102  96 - 112 mEq/L   CO2 25  19 - 32 mEq/L   Glucose, Bld 118 (*) 70 - 99 mg/dL   BUN 31 (*) 6 - 23 mg/dL   Creatinine, Ser 1.02  0.50 - 1.10 mg/dL   Calcium 10.7 (*) 8.4 - 10.5 mg/dL   GFR calc non Af Amer 60 (*) >90 mL/min   GFR calc Af Amer 69 (*) >90 mL/min   Comment: (NOTE)     The eGFR has been calculated using the CKD EPI equation.     This calculation has not been validated in all clinical situations.     eGFR's persistently <90 mL/min signify possible Chronic Kidney     Disease.  CBC     Status: Abnormal   Collection Time    03/12/13  6:09 AM      Result Value Ref Range  WBC 13.3 (*) 4.0 - 10.5 K/uL   RBC 3.84 (*) 3.87 - 5.11 MIL/uL   Hemoglobin 10.8 (*) 12.0 - 15.0 g/dL   HCT 32.7 (*) 36.0 - 46.0 %   MCV 85.2  78.0 - 100.0 fL   MCH 28.1  26.0 - 34.0 pg   MCHC 33.0  30.0 - 36.0 g/dL   RDW 14.9  11.5 - 15.5 %   Platelets 292  150 - 400 K/uL  PROTIME-INR     Status: None   Collection Time    03/12/13  6:09 AM      Result Value Ref Range   Prothrombin Time 11.8  11.6 - 15.2 seconds   INR 0.88  0.00 - 1.49   No results found.  Review of Systems  Constitutional: Negative for fever.  HENT:       New Rt sided headaches noticed x a few months. Comes and goes; lasts only minutes- no N/V; No vision sxs; No neurological sxs  Eyes: Negative for blurred vision and double vision.  Respiratory: Positive for cough and sputum production. Negative for shortness of breath and wheezing.        Recent sinusitis sxs--On Zpak and Prednisone since 2/16  Cardiovascular: Negative for chest pain.  Gastrointestinal: Negative for nausea, vomiting, abdominal pain and diarrhea.  Neurological: Positive  for headaches. Negative for dizziness, tingling, tremors, sensory change and weakness.  Psychiatric/Behavioral: Negative for substance abuse. The patient is not nervous/anxious.     Blood pressure 160/87, pulse 83, temperature 98.2 F (36.8 C), temperature source Oral, resp. rate 18, height _0  (1.778 m), weight 102.513 kg (226 lb), SpO2 100.00%. Physical Exam  Constitutional: She is oriented to person, place, and time. She appears well-nourished.  Cardiovascular: Normal rate, regular rhythm and normal heart sounds.   No murmur heard. Respiratory: Effort normal and breath sounds normal. She has no wheezes.  GI: Soft. Bowel sounds are normal. There is no tenderness.  Musculoskeletal: Normal range of motion.  Neurological: She is alert and oriented to person, place, and time.  Skin: Skin is warm and dry.  Psychiatric: She has a normal mood and affect. Her behavior is normal. Judgment and thought content normal.     Assessment/Plan L PCOM aneurysm stent/coil 2003 L ACA aneurysm stent/coil 2004 Known R ICA aneurysm - stable most recent MRI 07/2012 New R ACA aneurysm on MRI 07/2012 Asymptomatic except recent Rt headaches off and on x 2 months Now scheduled for cerebral arteriogram for evaluation and follow up on noted aneurysms Pt aware of procedure benefits and risks and agreeable to proceed Consent signed and in chart Wbc 13.3--on Pred for sinusitis and pharyngitis (day 2) Zpak- day 2  Lane Kjos A 03/12/2013, 7:46 AM

## 2013-03-12 NOTE — ED Notes (Signed)
Family updated as to patient's status.

## 2013-03-12 NOTE — Discharge Instructions (Signed)
Angiography, Care After ° °Refer to this sheet in the next few weeks. These instructions provide you with information on caring for yourself after your procedure. Your health care provider may also give you more specific instructions. Your treatment has been planned according to current medical practices, but problems sometimes occur. Call your health care provider if you have any problems or questions after your procedure.  °WHAT TO EXPECT AFTER THE PROCEDURE °After your procedure, it is typical to have the following sensations: °· Minor discomfort or tenderness and a small bump at the catheter insertion site. The bump should usually decrease in size and tenderness within 1 to 2 weeks. °· Any bruising will usually fade within 2 to 4 weeks. °HOME CARE INSTRUCTIONS  °· You may need to keep taking blood thinners if they were prescribed for you. Only take over-the-counter or prescription medicines for pain, fever, or discomfort as directed by your health care provider. °· Do not apply powder or lotion to the site. °· Do not sit in a bathtub, swimming pool, or whirlpool for 5 to 7 days. °· You may shower 24 hours after the procedure. Remove the bandage (dressing) and gently wash the site with plain soap and water. Gently pat the site dry. °· Inspect the site at least twice daily. °· Limit your activity for the first 24 hours. Do not bend, squat, or lift anything over 10 lb (9 kg) or as directed by your health care provider. °· Do not drive home if you are discharged the day of the procedure. Have someone else drive you. Follow instructions about when you can drive or return to work. °SEEK MEDICAL CARE IF: °· You get lightheaded when standing up. °· You have drainage (other than a small amount of blood on the dressing). °· You have chills. °· You have a fever. °· You have redness, warmth, swelling, or pain at the insertion site. °SEEK IMMEDIATE MEDICAL CARE IF:  °· You develop chest pain or shortness of breath, feel  faint, or pass out. °· You have bleeding, swelling larger than a walnut, or drainage from the catheter insertion site. °· You develop pain, discoloration, coldness, or severe bruising in the leg or arm that held the catheter. °· You have heavy bleeding from the site. If this happens, hold pressure on the site. °MAKE SURE YOU: °· Understand these instructions. °· Will watch your condition. °· Will get help right away if you are not doing well or get worse. °Document Released: 07/28/2004 Document Revised: 09/11/2012 Document Reviewed: 06/03/2012 °[employer]® Patient Information ©2014 [employer]. ° °

## 2013-03-12 NOTE — ED Notes (Signed)
Patient denies pain and is resting comfortably.  

## 2013-03-12 NOTE — Procedures (Signed)
S/P 4 vessel cerebral arteiogram. RT CFa approach. Findings../ 1.approx 2.80mmx 2.1 mm Rt ICA sup hypophyseal aneurysm.Marland Kitchen 2.Approx 2.3 mm x 1.5 mm Lt periophthalmic aneurysm.Marland Kitchen 3.?approx 1.36mmx 1.88mm ACOM region aneurysm

## 2013-03-12 NOTE — ED Notes (Signed)
Pt may be discharged home with family at 6.  V pad to be roved from right groin puncture site prior to dc, replace with adhesive bandage

## 2013-03-17 ENCOUNTER — Other Ambulatory Visit (HOSPITAL_COMMUNITY): Payer: Self-pay | Admitting: Interventional Radiology

## 2013-03-17 DIAGNOSIS — I729 Aneurysm of unspecified site: Secondary | ICD-10-CM

## 2013-03-19 ENCOUNTER — Ambulatory Visit (HOSPITAL_COMMUNITY): Payer: BC Managed Care – PPO

## 2013-03-24 ENCOUNTER — Ambulatory Visit (HOSPITAL_COMMUNITY)
Admission: RE | Admit: 2013-03-24 | Discharge: 2013-03-24 | Disposition: A | Discharge status: Home / Self Care | Payer: BC Managed Care – PPO | Source: Ambulatory Visit | Attending: Interventional Radiology | Admitting: Interventional Radiology

## 2013-03-24 DIAGNOSIS — I729 Aneurysm of unspecified site: Secondary | ICD-10-CM

## 2013-03-25 ENCOUNTER — Other Ambulatory Visit (HOSPITAL_COMMUNITY): Payer: Self-pay | Admitting: Interventional Radiology

## 2013-03-25 DIAGNOSIS — I729 Aneurysm of unspecified site: Secondary | ICD-10-CM

## 2013-04-01 ENCOUNTER — Ambulatory Visit: Payer: Self-pay | Admitting: Emergency Medicine

## 2013-04-10 ENCOUNTER — Other Ambulatory Visit: Payer: Self-pay | Admitting: Radiology

## 2013-04-10 ENCOUNTER — Encounter (HOSPITAL_COMMUNITY): Payer: Self-pay | Admitting: Pharmacy Technician

## 2013-04-15 NOTE — Pre-Procedure Instructions (Addendum)
Yolanda Espinoza  04/15/2013   Your procedure is scheduled on: Wednesday, April 1.  Report to [facility], Main Entrance / Entrance "A" at 6:30AM.  Call this number if you have problems the morning of surgery: [phone]   Remember:   Do not eat food or drink liquids after midnight Tuesday.   Take these medicines the morning of surgery with A SIP OF WATER: metoprolol succinate (TOPROL-XL).plavix         STOP all herbel meds, nsaids (aleve,naproxen,advil,ibuprofen) 5 days prior to surgery including vitamins, fish oil, magnesium    Do not wear jewelry, make-up or nail polish.  Do not wear lotions, powders, or perfumes.   Do not shave 48 hours prior to surgery.   Do not bring valuables to the hospital.  [facility] is not responsible for any belongings or valuables.               Contacts, dentures or bridgework may not be worn into surgery.  Leave suitcase in the car. After surgery it may be brought to your room.  For patients admitted to the hospital, discharge time is determined by your  treatment team.               Patients discharged the day of surgery will not be allowed to drive home.  Name and phone number of your driver: -   Special Instructions:  Special Instructions: [facility] - Preparing for Surgery  Before surgery, you can play an important role.  Because skin is not sterile, your skin needs to be as free of germs as possible.  You can reduce the number of germs on you skin by washing with CHG (chlorahexidine gluconate) soap before surgery.  CHG is an antiseptic cleaner which kills germs and bonds with the skin to continue killing germs even after washing.  Please DO NOT use if you have an allergy to CHG or antibacterial soaps.  If your skin becomes reddened/irritated stop using the CHG and inform your nurse when you arrive at Short Stay.  Do not shave (including legs and underarms) for at least 48 hours prior to the first CHG shower.  You may shave your  face.  Please follow these instructions carefully:   1.  Shower with CHG Soap the night before surgery and the morning of Surgery.  2.  If you choose to wash your hair, wash your hair first as usual with your normal shampoo.  3.  After you shampoo, rinse your hair and body thoroughly to remove the Shampoo.  4.  Use CHG as you would any other liquid soap.  You can apply chg directly  to the skin and wash gently with scrungie or a clean washcloth.  5.  Apply the CHG Soap to your body ONLY FROM THE NECK DOWN.  Do not use on open wounds or open sores.  Avoid contact with your eyes ears, mouth and genitals (private parts).  Wash genitals (private parts)       with your normal soap.  6.  Wash thoroughly, paying special attention to the area where your surgery will be performed.  7.  Thoroughly rinse your body with warm water from the neck down.  8.  DO NOT shower/wash with your normal soap after using and rinsing off the CHG Soap.  9.  Pat yourself dry with a clean towel.            10.  Wear clean pajamas.  11.  Place clean sheets on your bed the night of your first shower and do not sleep with pets.  Day of Surgery  Do not apply any lotions/deodorants the morning of surgery.  Please wear clean clothes to the hospital/surgery center.   Please read over the following fact sheets that you were given: Pain Booklet, Coughing and Deep Breathing and Surgical Site Infection Prevention

## 2013-04-16 ENCOUNTER — Encounter (HOSPITAL_COMMUNITY): Payer: Self-pay

## 2013-04-16 ENCOUNTER — Encounter (HOSPITAL_COMMUNITY)
Admission: RE | Admit: 2013-04-16 | Discharge: 2013-04-16 | Disposition: A | Discharge status: Home / Self Care | Payer: BC Managed Care – PPO | Source: Ambulatory Visit | Attending: Interventional Radiology | Admitting: Interventional Radiology

## 2013-04-16 ENCOUNTER — Ambulatory Visit (HOSPITAL_COMMUNITY)
Admission: RE | Admit: 2013-04-16 | Discharge: 2013-04-16 | Disposition: A | Discharge status: Home / Self Care | Payer: BC Managed Care – PPO | Source: Ambulatory Visit | Attending: Anesthesiology | Admitting: Anesthesiology

## 2013-04-16 DIAGNOSIS — Z01818 Encounter for other preprocedural examination: Secondary | ICD-10-CM | POA: Insufficient documentation

## 2013-04-16 DIAGNOSIS — Z01812 Encounter for preprocedural laboratory examination: Secondary | ICD-10-CM | POA: Insufficient documentation

## 2013-04-16 HISTORY — DX: Cerebral infarction, unspecified: I63.9

## 2013-04-16 HISTORY — DX: Gastro-esophageal reflux disease without esophagitis: K21.9

## 2013-04-16 HISTORY — DX: Unspecified osteoarthritis, unspecified site: M19.90

## 2013-04-16 HISTORY — DX: Peripheral vascular disease, unspecified: I73.9

## 2013-04-16 LAB — COMPREHENSIVE METABOLIC PANEL
ALT: 8 U/L (ref 0–35)
AST: 18 U/L (ref 0–37)
Albumin: 3.8 g/dL (ref 3.5–5.2)
Alkaline Phosphatase: 51 U/L (ref 39–117)
BUN: 17 mg/dL (ref 6–23)
CO2: 27 meq/L (ref 19–32)
CREATININE: 0.96 mg/dL (ref 0.50–1.10)
Calcium: 9.8 mg/dL (ref 8.4–10.5)
Chloride: 101 mEq/L (ref 96–112)
GFR, EST AFRICAN AMERICAN: 75 mL/min — AB (ref >90)
GFR, EST NON AFRICAN AMERICAN: 64 mL/min — AB (ref >90)
GLUCOSE: 92 mg/dL (ref 70–99)
Potassium: 4.5 mEq/L (ref 3.7–5.3)
SODIUM: 140 meq/L (ref 137–147)
Total Bilirubin: <0.2 mg/dL — ABNORMAL LOW (ref 0.3–1.2)
Total Protein: 7.6 g/dL (ref 6.0–8.3)

## 2013-04-16 LAB — CBC WITH DIFFERENTIAL/PLATELET
Basophils Absolute: 0 10*3/uL (ref 0.0–0.1)
Basophils Relative: 0 % (ref 0–1)
EOS PCT: 3 % (ref 0–5)
Eosinophils Absolute: 0.2 10*3/uL (ref 0.0–0.7)
HCT: 31.4 % — ABNORMAL LOW (ref 36.0–46.0)
Hemoglobin: 10.5 g/dL — ABNORMAL LOW (ref 12.0–15.0)
LYMPHS ABS: 3.5 10*3/uL (ref 0.7–4.0)
LYMPHS PCT: 50 % — AB (ref 12–46)
MCH: 28.5 pg (ref 26.0–34.0)
MCHC: 33.4 g/dL (ref 30.0–36.0)
MCV: 85.3 fL (ref 78.0–100.0)
MONO ABS: 0.4 10*3/uL (ref 0.1–1.0)
Monocytes Relative: 6 % (ref 3–12)
Neutro Abs: 2.8 10*3/uL (ref 1.7–7.7)
Neutrophils Relative %: 40 % — ABNORMAL LOW (ref 43–77)
Platelets: 306 10*3/uL (ref 150–400)
RBC: 3.68 MIL/uL — AB (ref 3.87–5.11)
RDW: 14.3 % (ref 11.5–15.5)
WBC: 6.9 10*3/uL (ref 4.0–10.5)

## 2013-04-16 LAB — APTT: aPTT: 32 seconds (ref 24–37)

## 2013-04-16 LAB — PROTIME-INR
INR: 0.86 (ref 0.00–1.49)
Prothrombin Time: 11.6 seconds (ref 11.6–15.2)

## 2013-04-17 NOTE — Progress Notes (Signed)
Anesthesia Chart Review:  Patient is a 58 year old female scheduled for cerebral arteriogram with possible embolization of left ICA aneurysm on 04/23/13 by Dr. Estanislado Pandy.       History includes former smoker, diastolic CHF, murmur with mild to moderate MR by echo '15, hypertension, hyperlipidemia, GERD, obstructive sleep apnea on CPAP, arthritis, SAH due to ruptured cerebral aneurysm s/p coiling left PCA '03 and left ACA '04, known right ICA aneurysm, ACA and left ICA peri-opthalmic region aneurysm 02/2013, CVA, arthritis, T&A. BMI is 32 consistent with obesity.  Cardiologist is Dr. Acie Fredrickson, last visit 03/05/13 and she was felt stable from a cardiac standpoint. PCP is listed as Dr. Kelby Aline.  EKG on 10/11/12 showed NSR, septal infarct (age undetermined). Stable since previous tracing.  Echo on 02/26/13 showed: - Left ventricle: The cavity size was mildly dilated. Systolic function was normal. The estimated ejection fraction was in the range of 55% to 60%. Wall motion was normal; there were no regional wall motion abnormalities. Doppler parameters are consistent with abnormal left ventricular relaxation (grade 1 diastolic dysfunction). - Mitral valve: Mild to moderate regurgitation directed posteriorly. - Left atrium: The atrium was mildly dilated. - Pulmonic valve: Trivial regurgitation. - Tricuspid valve: Mild regurgitation. Impressions: Compared to the prior study, there has been no significant interval change.  Carotid/subclavian/innominate angiography on 03/12/13 showed: Angiographically no evidence of coil compaction or recanalization of the previously treated left internal carotid artery superior hypophyseal region aneurysm, and the left anterior cerebral artery origin aneurysm. Stable 2.5 mm x 2.2 mm right internal carotid artery superior hypophyseal region aneurysm. Suspicion of a 1.8 mm x 1.3 mm anterior communicating artery region aneurysm.Approximately 2.3 mm x 1.6 mm left internal carotid  artery periophthalmic region aneurysm.  CXR on 04/16/13 showed no active cardiopulmonary disease.  Preoperative labs noted.  She is scheduled to start Plavix on 04/18/13. She will get p2y12 on the day of surgery.  Of note, consent states "left" ICA, but Dr. Arlean Hopping evaluation states "right" ICA.  I called and spoke to Dr. Arlean Hopping office (I believe with [facility]) about the discrepancy and was told it would be addressed before or on the day of surgery.   She has had recent cardiology follow-up with stable echo and EKG.  If no acute changes and same day labs acceptable then I would anticipate that she could proceed as planned.  George Hugh [facility] Short Stay Center/Anesthesiology Phone [phone] 04/17/2013 1:35 PM

## 2013-04-22 ENCOUNTER — Encounter (HOSPITAL_COMMUNITY): Payer: Self-pay | Admitting: Certified Registered Nurse Anesthetist

## 2013-04-23 ENCOUNTER — Ambulatory Visit (HOSPITAL_COMMUNITY): Payer: BC Managed Care – PPO | Admitting: Certified Registered Nurse Anesthetist

## 2013-04-23 ENCOUNTER — Encounter (HOSPITAL_COMMUNITY)
Admission: RE | Disposition: A | Discharge status: Home / Self Care | Payer: Self-pay | Source: Ambulatory Visit | Attending: Interventional Radiology

## 2013-04-23 ENCOUNTER — Inpatient Hospital Stay (HOSPITAL_COMMUNITY)
Admission: RE | Admit: 2013-04-23 | Discharge: 2013-04-24 | DRG: 026 | Disposition: A | Discharge status: Home / Self Care | Payer: BC Managed Care – PPO | Source: Ambulatory Visit | Attending: Interventional Radiology | Admitting: Interventional Radiology

## 2013-04-23 ENCOUNTER — Ambulatory Visit (HOSPITAL_COMMUNITY)
Admission: RE | Admit: 2013-04-23 | Discharge: 2013-04-23 | Disposition: A | Discharge status: Home / Self Care | Payer: BC Managed Care – PPO | Source: Ambulatory Visit | Attending: Interventional Radiology | Admitting: Interventional Radiology

## 2013-04-23 ENCOUNTER — Encounter (HOSPITAL_COMMUNITY): Payer: Self-pay | Admitting: *Deleted

## 2013-04-23 ENCOUNTER — Encounter (HOSPITAL_COMMUNITY): Payer: BC Managed Care – PPO | Admitting: Vascular Surgery

## 2013-04-23 VITALS — BP 171/73 | HR 67 | Temp 98.3°F | Resp 20 | Ht 70.0 in | Wt 224.0 lb

## 2013-04-23 DIAGNOSIS — I729 Aneurysm of unspecified site: Secondary | ICD-10-CM

## 2013-04-23 DIAGNOSIS — R011 Cardiac murmur, unspecified: Secondary | ICD-10-CM | POA: Diagnosis present

## 2013-04-23 DIAGNOSIS — I671 Cerebral aneurysm, nonruptured: Principal | ICD-10-CM | POA: Diagnosis present

## 2013-04-23 DIAGNOSIS — I10 Essential (primary) hypertension: Secondary | ICD-10-CM | POA: Diagnosis present

## 2013-04-23 DIAGNOSIS — E559 Vitamin D deficiency, unspecified: Secondary | ICD-10-CM | POA: Diagnosis present

## 2013-04-23 DIAGNOSIS — I739 Peripheral vascular disease, unspecified: Secondary | ICD-10-CM | POA: Diagnosis present

## 2013-04-23 DIAGNOSIS — K219 Gastro-esophageal reflux disease without esophagitis: Secondary | ICD-10-CM | POA: Diagnosis present

## 2013-04-23 DIAGNOSIS — I5032 Chronic diastolic (congestive) heart failure: Secondary | ICD-10-CM | POA: Diagnosis present

## 2013-04-23 DIAGNOSIS — E785 Hyperlipidemia, unspecified: Secondary | ICD-10-CM | POA: Diagnosis present

## 2013-04-23 DIAGNOSIS — I509 Heart failure, unspecified: Secondary | ICD-10-CM | POA: Diagnosis present

## 2013-04-23 DIAGNOSIS — G4733 Obstructive sleep apnea (adult) (pediatric): Secondary | ICD-10-CM | POA: Diagnosis present

## 2013-04-23 DIAGNOSIS — Z8673 Personal history of transient ischemic attack (TIA), and cerebral infarction without residual deficits: Secondary | ICD-10-CM

## 2013-04-23 DIAGNOSIS — M1990 Unspecified osteoarthritis, unspecified site: Secondary | ICD-10-CM | POA: Diagnosis present

## 2013-04-23 DIAGNOSIS — I059 Rheumatic mitral valve disease, unspecified: Secondary | ICD-10-CM | POA: Diagnosis present

## 2013-04-23 HISTORY — PX: RADIOLOGY WITH ANESTHESIA: SHX6223

## 2013-04-23 LAB — HEPARIN LEVEL (UNFRACTIONATED): Heparin Unfractionated: 0.13 IU/mL — ABNORMAL LOW (ref 0.30–0.70)

## 2013-04-23 LAB — MRSA PCR SCREENING: MRSA BY PCR: NEGATIVE

## 2013-04-23 LAB — POCT ACTIVATED CLOTTING TIME
ACTIVATED CLOTTING TIME: 160 s
Activated Clotting Time: 160 seconds
Activated Clotting Time: 166 seconds

## 2013-04-23 LAB — PLATELET INHIBITION P2Y12: Platelet Function  P2Y12: 265 [PRU] (ref 194–418)

## 2013-04-23 SURGERY — RADIOLOGY WITH ANESTHESIA
Anesthesia: General

## 2013-04-23 MED ORDER — CEFAZOLIN SODIUM-DEXTROSE 2-3 GM-% IV SOLR
2.0000 g | Freq: Once | INTRAVENOUS | Status: AC
  Administered 2013-04-23: 2 g via INTRAVENOUS
  Filled 2013-04-23 (×2): qty 50

## 2013-04-23 MED ORDER — NIMODIPINE 30 MG PO CAPS
ORAL_CAPSULE | ORAL | Status: AC
  Filled 2013-04-23: qty 2

## 2013-04-23 MED ORDER — ASPIRIN EC 325 MG PO TBEC
325.0000 mg | DELAYED_RELEASE_TABLET | Freq: Once | ORAL | Status: AC
  Administered 2013-04-23: 325 mg via ORAL
  Filled 2013-04-23: qty 1

## 2013-04-23 MED ORDER — PHENYLEPHRINE HCL 10 MG/ML IJ SOLN
20.0000 mg | INTRAVENOUS | Status: DC | PRN
  Administered 2013-04-23: 25 ug via INTRAVENOUS

## 2013-04-23 MED ORDER — HEPARIN (PORCINE) IN NACL 100-0.45 UNIT/ML-% IJ SOLN
800.0000 [IU]/h | INTRAMUSCULAR | Status: AC
  Administered 2013-04-23: 500 [IU]/h via INTRAVENOUS
  Administered 2013-04-23: 800 [IU]/h via INTRAVENOUS
  Filled 2013-04-23: qty 250

## 2013-04-23 MED ORDER — ONDANSETRON HCL 4 MG/2ML IJ SOLN: 4.0000 mg | Freq: Four times a day (QID) | INTRAMUSCULAR | Status: DC | PRN

## 2013-04-23 MED ORDER — ASPIRIN EC 325 MG PO TBEC
DELAYED_RELEASE_TABLET | ORAL | Status: AC
  Filled 2013-04-23: qty 1

## 2013-04-23 MED ORDER — ROCURONIUM BROMIDE 100 MG/10ML IV SOLN
INTRAVENOUS | Status: DC | PRN
  Administered 2013-04-23: 20 mg via INTRAVENOUS
  Administered 2013-04-23: 50 mg via INTRAVENOUS

## 2013-04-23 MED ORDER — ACETAMINOPHEN 500 MG PO TABS
1000.0000 mg | ORAL_TABLET | Freq: Four times a day (QID) | ORAL | Status: DC | PRN
  Administered 2013-04-23 – 2013-04-24 (×2): 1000 mg via ORAL
  Filled 2013-04-23 (×2): qty 2

## 2013-04-23 MED ORDER — CLOPIDOGREL BISULFATE 75 MG PO TABS
75.0000 mg | ORAL_TABLET | Freq: Every day | ORAL | Status: DC
  Administered 2013-04-24: 75 mg via ORAL
  Filled 2013-04-23 (×2): qty 1

## 2013-04-23 MED ORDER — NICARDIPINE HCL IN NACL 20-0.86 MG/200ML-% IV SOLN
5.0000 mg/h | INTRAVENOUS | Status: DC
  Administered 2013-04-23: 12 mg/h via INTRAVENOUS
  Administered 2013-04-23: 15 mg/h via INTRAVENOUS
  Administered 2013-04-23: 10 mg/h via INTRAVENOUS
  Administered 2013-04-23: 5 mg/h via INTRAVENOUS
  Administered 2013-04-23: 15 mg/h via INTRAVENOUS
  Administered 2013-04-23: 12 mg/h via INTRAVENOUS
  Filled 2013-04-23 (×4): qty 200

## 2013-04-23 MED ORDER — LIDOCAINE HCL (CARDIAC) 20 MG/ML IV SOLN
INTRAVENOUS | Status: DC | PRN
  Administered 2013-04-23: 160 mg via INTRAVENOUS

## 2013-04-23 MED ORDER — NEOSTIGMINE METHYLSULFATE 1 MG/ML IJ SOLN
INTRAMUSCULAR | Status: DC | PRN
  Administered 2013-04-23: 5 mg via INTRAVENOUS

## 2013-04-23 MED ORDER — NITROGLYCERIN 1 MG/10 ML FOR IR/CATH LAB
INTRA_ARTERIAL | Status: DC
Start: 2013-04-23 — End: 2013-04-24
  Filled 2013-04-23: qty 10

## 2013-04-23 MED ORDER — CLOPIDOGREL BISULFATE 75 MG PO TABS
ORAL_TABLET | ORAL | Status: AC
  Filled 2013-04-23: qty 1

## 2013-04-23 MED ORDER — ASPIRIN 325 MG PO TABS
325.0000 mg | ORAL_TABLET | Freq: Every day | ORAL | Status: DC
  Filled 2013-04-23: qty 1

## 2013-04-23 MED ORDER — ACETAMINOPHEN 650 MG RE SUPP: 650.0000 mg | Freq: Four times a day (QID) | RECTAL | Status: DC | PRN

## 2013-04-23 MED ORDER — MIDAZOLAM HCL 5 MG/5ML IJ SOLN
INTRAMUSCULAR | Status: DC | PRN
  Administered 2013-04-23: 1 mg via INTRAVENOUS

## 2013-04-23 MED ORDER — SODIUM CHLORIDE 0.9 % IV SOLN
INTRAVENOUS | Status: DC
  Administered 2013-04-24: 75 mL/h via INTRAVENOUS

## 2013-04-23 MED ORDER — ACETAMINOPHEN 325 MG PO TABS
ORAL_TABLET | ORAL | Status: AC
  Filled 2013-04-23: qty 2

## 2013-04-23 MED ORDER — CLOPIDOGREL BISULFATE 75 MG PO TABS: 75.0000 mg | ORAL_TABLET | Freq: Every day | ORAL | Status: DC

## 2013-04-23 MED ORDER — SODIUM CHLORIDE 0.9 % IV SOLN: Freq: Once | INTRAVENOUS | Status: DC

## 2013-04-23 MED ORDER — HEPARIN SODIUM (PORCINE) 1000 UNIT/ML IJ SOLN
INTRAMUSCULAR | Status: DC | PRN
  Administered 2013-04-23 (×3): 500 [IU] via INTRAVENOUS
  Administered 2013-04-23: 3000 [IU] via INTRAVENOUS

## 2013-04-23 MED ORDER — GLYCOPYRROLATE 0.2 MG/ML IJ SOLN
INTRAMUSCULAR | Status: DC | PRN
  Administered 2013-04-23: .8 mg via INTRAVENOUS

## 2013-04-23 MED ORDER — NIMODIPINE 30 MG PO CAPS
60.0000 mg | ORAL_CAPSULE | ORAL | Status: AC
  Administered 2013-04-23: 60 mg via ORAL
  Filled 2013-04-23: qty 2

## 2013-04-23 MED ORDER — EPHEDRINE SULFATE 50 MG/ML IJ SOLN
INTRAMUSCULAR | Status: DC | PRN
  Administered 2013-04-23: 5 mg via INTRAVENOUS

## 2013-04-23 MED ORDER — FENTANYL CITRATE 0.05 MG/ML IJ SOLN
INTRAMUSCULAR | Status: DC | PRN
  Administered 2013-04-23: 150 ug via INTRAVENOUS

## 2013-04-23 MED ORDER — NICARDIPINE HCL IN NACL 40-0.83 MG/200ML-% IV SOLN
5.0000 mg/h | INTRAVENOUS | Status: DC
  Administered 2013-04-23: 8 mg/h via INTRAVENOUS
  Administered 2013-04-23: 15 mg/h via INTRAVENOUS
  Filled 2013-04-23 (×2): qty 200

## 2013-04-23 MED ORDER — LACTATED RINGERS IV SOLN
INTRAVENOUS | Status: DC | PRN
  Administered 2013-04-23 (×2): via INTRAVENOUS

## 2013-04-23 MED ORDER — IOHEXOL 300 MG/ML  SOLN
150.0000 mL | Freq: Once | INTRAMUSCULAR | Status: AC | PRN
  Administered 2013-04-23: 85 mL via INTRAVENOUS

## 2013-04-23 MED ORDER — LACTATED RINGERS IV SOLN
INTRAVENOUS | Status: DC | PRN
  Administered 2013-04-23: 08:00:00 via INTRAVENOUS

## 2013-04-23 MED ORDER — ARTIFICIAL TEARS OP OINT
TOPICAL_OINTMENT | OPHTHALMIC | Status: DC | PRN
  Administered 2013-04-23: 1 via OPHTHALMIC

## 2013-04-23 MED ORDER — CLOPIDOGREL BISULFATE 75 MG PO TABS
75.0000 mg | ORAL_TABLET | Freq: Once | ORAL | Status: AC
  Administered 2013-04-23: 75 mg via ORAL

## 2013-04-23 MED ORDER — ONDANSETRON HCL 4 MG/2ML IJ SOLN
INTRAMUSCULAR | Status: DC | PRN
  Administered 2013-04-23: 4 mg via INTRAVENOUS

## 2013-04-23 NOTE — Progress Notes (Signed)
ANTICOAGULATION CONSULT NOTE - Follow Up Consult  Pharmacy Consult for heparin Indication: s/p IR procedure  Allergies  Allergen Reactions  . Aspirin Nausea And Vomiting  . Ace Inhibitors     Patient Measurements: Height: 5\' 10"  (177.8 cm) Weight: 225 lb 1.4 oz (102.1 kg) IBW/kg (Calculated) : 68.5 Heparin Dosing Weight: 90 kg  Vital Signs: Temp: 98.7 F (37.1 C) (04/01 1953) Temp src: Oral (04/01 1953) BP: 127/68 mmHg (04/01 1800) Pulse Rate: 72 (04/01 1800)  Labs:  Recent Labs  04/23/13 1854  HEPARINUNFRC 0.13*    Estimated Creatinine Clearance: 83.6 ml/min (by C-G formula based on Cr of 0.96).   Medications:  Infusions:  . sodium chloride 75 mL/hr at 04/23/13 1315  . heparin 800 Units/hr (04/23/13 1500)  . niCARDipine 15 mg/hr (04/23/13 1817)    Assessment: 58 y/o female on a heparin drip s/p R common carotid arteriogram followed by embolization of brain aneurysm. Heparin level is therapeutic on 800 units/hr. No bleeding noted.   Goal of Therapy:  Heparin level 0.1-0.25 units/ml Monitor platelets by anticoagulation protocol: Yes   Plan:  - Continue heparin drip at 800 units/hr - to be turned off at 7a - F/U in the morning  [facility], [place].D., BCPS Clinical Pharmacist Pager: [phone] 04/23/2013 7:57 PM

## 2013-04-23 NOTE — Transfer of Care (Signed)
Immediate Anesthesia Transfer of Care Note  Patient: Yolanda Espinoza  Procedure(s) Performed: Procedure(s): RADIOLOGY WITH ANESTHESIA (N/A)  Patient Location: PACU  Anesthesia Type:General  Level of Consciousness: awake, alert  and oriented  Airway & Oxygen Therapy: Patient connected to nasal cannula oxygen  Post-op Assessment: Report given to PACU RN, Post -op Vital signs reviewed and stable, Patient moving all extremities X 4 and Patient able to stick tongue midline  Post vital signs: Reviewed and stable  Complications: No apparent anesthesia complications

## 2013-04-23 NOTE — H&P (Signed)
Yolanda Espinoza is an 58 y.o. female.   Chief Complaint: cerebral aneurysms HPI: Patient with history of multiple intracranial aneurysms and previously treated left ICA superior hypophyseal region and left anterior cerebral artery aneurysms presents today for attempted treatment of a known right ICA superior hypophyseal region aneurysm with Pipeline flow diverter device/possible coil embolization.  Past Medical History  Diagnosis Date  . Subarachnoid hemorrhage due to ruptured aneurysm     2003 - s/p coiling  . Hypertension may, 1973  . Mitral regurgitation     a. Echo 01/2012 mild LVH, EF 55-65%, Gr 2 DD, mod MR, mild LAE, PASP 36;  b. Echo (02/2013):  EF 55-60%, Gr 1 DD, mild to mod MR, mild LAE (no sig change since prior echo)  . Chronic diastolic heart failure   . Hyperlipidemia   . Unspecified vitamin D deficiency   . Heart murmur   . Stroke 07    no weakness  . Peripheral vascular disease     cerebral aneursym  . OSA on CPAP     7 yrs  . GERD (gastroesophageal reflux disease)   . Arthritis     Past Surgical History  Procedure Laterality Date  . Aneurysm coiling    . Head surgery      aneurysm ruptured- stroke  . Abdominal hysterectomy    . Tonsillectomy and adenoidectomy      Family History  Problem Relation Age of Onset  . Hypertension Father   . Kidney disease Father   . Hypertension Sister   . Hypertension Sister   . Hypertension Sister    Social History:  reports that she quit smoking about 14 years ago. Her smoking use included Cigarettes. She has a .75 pack-year smoking history. She does not have any smokeless tobacco history on file. She reports that she does not drink alcohol or use illicit drugs.  Allergies:  Allergies  Allergen Reactions  . Aspirin Nausea And Vomiting  . Ace Inhibitors     No current facility-administered medications for this encounter. No current outpatient prescriptions on file. Facility-Administered Medications Ordered in Other  Encounters: 0.9 %  sodium chloride infusion, , Intravenous, Once, Lavonia Drafts, PA-C;  aspirin EC 325 MG tablet, , , , ;  ceFAZolin (ANCEF) IVPB 2 g/50 mL premix, 2 g, Intravenous, Once, Lavonia Drafts, PA-C;  niMODipine (NIMOTOP) 30 MG capsule, , , ,    Results for orders placed during the hospital encounter of 04/16/13  APTT      Result Value Ref Range   aPTT 32  24 - 37 seconds  CBC WITH DIFFERENTIAL      Result Value Ref Range   WBC 6.9  4.0 - 10.5 K/uL   RBC 3.68 (*) 3.87 - 5.11 MIL/uL   Hemoglobin 10.5 (*) 12.0 - 15.0 g/dL   HCT 31.4 (*) 36.0 - 46.0 %   MCV 85.3  78.0 - 100.0 fL   MCH 28.5  26.0 - 34.0 pg   MCHC 33.4  30.0 - 36.0 g/dL   RDW 14.3  11.5 - 15.5 %   Platelets 306  150 - 400 K/uL   Neutrophils Relative % 40 (*) 43 - 77 %   Neutro Abs 2.8  1.7 - 7.7 K/uL   Lymphocytes Relative 50 (*) 12 - 46 %   Lymphs Abs 3.5  0.7 - 4.0 K/uL   Monocytes Relative 6  3 - 12 %   Monocytes Absolute 0.4  0.1 - 1.0 K/uL  Eosinophils Relative 3  0 - 5 %   Eosinophils Absolute 0.2  0.0 - 0.7 K/uL   Basophils Relative 0  0 - 1 %   Basophils Absolute 0.0  0.0 - 0.1 K/uL  COMPREHENSIVE METABOLIC PANEL      Result Value Ref Range   Sodium 140  137 - 147 mEq/L   Potassium 4.5  3.7 - 5.3 mEq/L   Chloride 101  96 - 112 mEq/L   CO2 27  19 - 32 mEq/L   Glucose, Bld 92  70 - 99 mg/dL   BUN 17  6 - 23 mg/dL   Creatinine, Ser 0.96  0.50 - 1.10 mg/dL   Calcium 9.8  8.4 - 10.5 mg/dL   Total Protein 7.6  6.0 - 8.3 g/dL   Albumin 3.8  3.5 - 5.2 g/dL   AST 18  0 - 37 U/L   ALT 8  0 - 35 U/L   Alkaline Phosphatase 51  39 - 117 U/L   Total Bilirubin <0.2 (*) 0.3 - 1.2 mg/dL   GFR calc non Af Amer 64 (*) >90 mL/min   GFR calc Af Amer 75 (*) >90 mL/min  PROTIME-INR      Result Value Ref Range   Prothrombin Time 11.6  11.6 - 15.2 seconds   INR 0.86  0.00 - 1.49   04/23/13 P2Y12    265 Review of Systems  Constitutional: Negative for fever and chills.  Eyes: Negative for blurred vision and  double vision.  Respiratory: Negative for cough and shortness of breath.   Cardiovascular: Positive for chest pain.  Gastrointestinal: Negative for nausea, vomiting and abdominal pain.  Musculoskeletal: Positive for back pain.  Neurological: Negative for dizziness, sensory change, speech change, focal weakness, seizures, loss of consciousness and headaches.  Endo/Heme/Allergies: Does not bruise/bleed easily.  Psychiatric/Behavioral: The patient is nervous/anxious.     Blood pressure 171/73, pulse 67, temperature 98.3 F (36.8 C), temperature source Oral, resp. rate 20, height 5\' 10"  (1.778 m), weight 224 lb (101.606 kg), SpO2 100.00%. Physical Exam  Constitutional: She is oriented to person, place, and time. She appears well-developed and well-nourished.  Cardiovascular: Normal rate and regular rhythm.   Murmur heard. Respiratory: Effort normal and breath sounds normal.  GI: Soft. Bowel sounds are normal. There is no tenderness.  obese  Musculoskeletal: Normal range of motion. She exhibits no edema.  Neurological: She is alert and oriented to person, place, and time. No cranial nerve deficit. Coordination normal.  Skin: Skin is warm and dry.  Psychiatric: She has a normal mood and affect.     Assessment/Plan Patient with history of multiple intracranial aneurysms and previously treated left ICA superior hypophyseal region and left anterior cerebral artery aneurysms presents today for attempted treatment of a known right ICA superior hypophyseal region aneurysm with Pipeline flow diverter device/possible coil embolization. Details/risks of procedure d/w pt/sister with their understanding and consent. If endovascular intervention performed the pt will be admitted for overnight observation to neuro ICU.   Jearld Hemp,D KEVIN 04/23/2013, 7:49 AM

## 2013-04-23 NOTE — Procedures (Signed)
S/P Rt common carotid arteriogram,followed  By embolization of brain aneurysm,Rt ICA sup hypophyseal  using pipeline flow diverter.

## 2013-04-23 NOTE — Anesthesia Postprocedure Evaluation (Signed)
  Anesthesia Post-op Note  Patient: Yolanda Espinoza  Procedure(s) Performed: Procedure(s): RADIOLOGY WITH ANESTHESIA (N/A)  Patient Location: PACU  Anesthesia Type:General  Level of Consciousness: awake  Airway and Oxygen Therapy: Patient Spontanous Breathing  Post-op Pain: mild  Post-op Assessment: Post-op Vital signs reviewed  Post-op Vital Signs: Reviewed  Complications: No apparent anesthesia complications

## 2013-04-23 NOTE — Progress Notes (Signed)
Day of Surgery  Subjective: Pt doing well; no new c/o  Objective: Vital signs in last 24 hours: Temp:  [97.6 F (36.4 C)-98.3 F (36.8 C)] 98 F (36.7 C) (04/01 1530) Pulse Rate:  [66-78] 70 (04/01 1530) Resp:  [10-20] 13 (04/01 1530) BP: (100-171)/(54-82) 105/58 mmHg (04/01 1500) SpO2:  [93 %-100 %] 100 % (04/01 1530) Arterial Line BP: (115-151)/(49-69) 135/54 mmHg (04/01 1530) Weight:  [224 lb (101.606 kg)-225 lb 1.4 oz (102.1 kg)] 225 lb 1.4 oz (102.1 kg) (04/01 1315)    Intake/Output from previous day:   Intake/Output this shift: Total I/O In: 2259.6 [I.V.:2259.6] Out: 1650 [Urine:1250; Blood:400]  Pt awake/alert; speech nl, face symm, tongue midline, PERRLA/EOMI, no drift, strength 5/5 all fours; finger to nose and FMM nl; rt groin clean and dry,mildly tender, no hematoma, intact distal pulses  Lab Results:  No results found for this basename: WBC, HGB, HCT, PLT,  in the last 72 hours BMET No results found for this basename: NA, K, CL, CO2, GLUCOSE, BUN, CREATININE, CALCIUM,  in the last 72 hours PT/INR No results found for this basename: LABPROT, INR,  in the last 72 hours ABG No results found for this basename: PHART, PCO2, PO2, HCO3,  in the last 72 hours  Studies/Results: No results found.  Anti-infectives: Anti-infectives   Start     Dose/Rate Route Frequency Ordered Stop   04/23/13 0715  ceFAZolin (ANCEF) IVPB 2 g/50 mL premix     2 g 100 mL/hr over 30 Minutes Intravenous  Once 04/23/13 0704 04/23/13 [id number]      Assessment/Plan: S/p Rt common carotid arteriogram,followed by embolization of brain aneurysm,Rt ICA sup hypophyseal region using pipeline flow diverter; for overnight obs; advance to liquid diet; cont IV heparin, plavix/ECASA; check am labs; probable d/c home 4/2 if neurologically stable    LOS: 0 days    Temple Ewart,D [facility] 04/23/2013

## 2013-04-23 NOTE — Anesthesia Preprocedure Evaluation (Addendum)
Anesthesia Evaluation  Patient identified by MRN, date of birth, ID band Patient awake    Reviewed: Allergy & Precautions, H&P , NPO status , Patient's Chart, lab work & pertinent test results, reviewed documented beta blocker date and time   History of Anesthesia Complications Negative for: history of anesthetic complications  Airway Mallampati: II TM Distance: >3 FB Neck ROM: Full    Dental  (+) Upper Dentures, Partial Lower   Pulmonary sleep apnea and Continuous Positive Airway Pressure Ventilation , former smoker,  breath sounds clear to auscultation  Pulmonary exam normal       Cardiovascular hypertension, Pt. on medications and Pt. on home beta blockers + Peripheral Vascular Disease and +CHF Rhythm:Regular Rate:Normal  Diastolic HF, mitral regurgitation, heart murmur, hyperlipdemia Echo 01/2012 mild LVH, EF 55-65%, Gr 2 DD, mod MR, mild LAE, PASP 36;  b. Echo (02/2013):  EF 55-60%, Gr 1 DD, mild to mod MR, mild LAE (no sig change since prior echo)    Neuro/Psych S/p [facility] 2003 L PCOM aneurysm stent/coil 2003 L ACA aneurysm stent/coil 2004 CVA, No Residual Symptoms    GI/Hepatic Neg liver ROS, GERD-  Controlled and Medicated,  Endo/Other  negative endocrine ROS  Renal/GU negative Renal ROS  negative genitourinary   Musculoskeletal negative musculoskeletal ROS (+)   Abdominal Normal abdominal exam  (+)   Peds  Hematology negative hematology ROS (+)   Anesthesia Other Findings   Reproductive/Obstetrics negative OB ROS                         Anesthesia Physical Anesthesia Plan  ASA: III  Anesthesia Plan: MAC and General   Post-op Pain Management:    Induction: Intravenous  Airway Management Planned: Oral ETT  Additional Equipment: Arterial line  Intra-op Plan:   Post-operative Plan: Extubation in OR  Informed Consent: I have reviewed the patients History and Physical, chart, labs  and discussed the procedure including the risks, benefits and alternatives for the proposed anesthesia with the patient or authorized representative who has indicated his/her understanding and acceptance.   Dental advisory given  Plan Discussed with: CRNA, Anesthesiologist and Surgeon  Anesthesia Plan Comments:         Anesthesia Quick Evaluation

## 2013-04-23 NOTE — Progress Notes (Signed)
ANTICOAGULATION CONSULT NOTE - Initial Consult  Pharmacy Consult for heparin Indication: s/p cerebral angiogram  Allergies  Allergen Reactions  . Aspirin Nausea And Vomiting  . Ace Inhibitors     Patient Measurements: weight 102kg, height 70 inches   Heparin Dosing Weight: 90 kg  Vital Signs: Temp: 97.6 F (36.4 C) (04/01 1134) Temp src: Oral (04/01 0624) BP: 128/70 mmHg (04/01 1150) Pulse Rate: 70 (04/01 1150)  Labs: No results found for this basename: HGB, HCT, PLT, APTT, LABPROT, INR, HEPARINUNFRC, CREATININE, CKTOTAL, CKMB, TROPONINI,  in the last 72 hours  The CrCl is unknown because both a height and weight (above a minimum accepted value) are required for this calculation.   Medical History: Past Medical History  Diagnosis Date  . Subarachnoid hemorrhage due to ruptured aneurysm     2003 - s/p coiling  . Hypertension may, 1973  . Mitral regurgitation     a. Echo 01/2012 mild LVH, EF 55-65%, Gr 2 DD, mod MR, mild LAE, PASP 36;  b. Echo (02/2013):  EF 55-60%, Gr 1 DD, mild to mod MR, mild LAE (no sig change since prior echo)  . Chronic diastolic heart failure   . Hyperlipidemia   . Unspecified vitamin D deficiency   . Heart murmur   . Stroke 07    no weakness  . Peripheral vascular disease     cerebral aneursym  . OSA on CPAP     7 yrs  . GERD (gastroesophageal reflux disease)   . Arthritis     Assessment: Patient is a 58 y.o F s/p cerebral angiogram for brain aneurysm.  To start heparin post-procedure.  Heparin started at 500 units/hr per neuro in the PACU.  Goal of Therapy:  heparin level= 0.1-0.25  Monitor platelets by anticoagulation protocol: Yes   Plan:  1) increase heparin drip to 800 units/hr (~9 units/kg/hr) 2) check 6 hour heparin level  Yolanda Espinoza P 04/23/2013,12:40 PM

## 2013-04-24 ENCOUNTER — Encounter (HOSPITAL_COMMUNITY): Payer: Self-pay | Admitting: Interventional Radiology

## 2013-04-24 ENCOUNTER — Other Ambulatory Visit: Payer: Self-pay | Admitting: Radiology

## 2013-04-24 DIAGNOSIS — I671 Cerebral aneurysm, nonruptured: Secondary | ICD-10-CM

## 2013-04-24 LAB — CBC WITH DIFFERENTIAL/PLATELET
Basophils Absolute: 0 10*3/uL (ref 0.0–0.1)
Basophils Relative: 0 % (ref 0–1)
EOS PCT: 2 % (ref 0–5)
Eosinophils Absolute: 0.1 10*3/uL (ref 0.0–0.7)
HCT: 26.8 % — ABNORMAL LOW (ref 36.0–46.0)
Hemoglobin: 8.9 g/dL — ABNORMAL LOW (ref 12.0–15.0)
LYMPHS ABS: 2.5 10*3/uL (ref 0.7–4.0)
LYMPHS PCT: 38 % (ref 12–46)
MCH: 28.4 pg (ref 26.0–34.0)
MCHC: 33.2 g/dL (ref 30.0–36.0)
MCV: 85.6 fL (ref 78.0–100.0)
Monocytes Absolute: 0.4 10*3/uL (ref 0.1–1.0)
Monocytes Relative: 7 % (ref 3–12)
Neutro Abs: 3.5 10*3/uL (ref 1.7–7.7)
Neutrophils Relative %: 53 % (ref 43–77)
Platelets: 237 10*3/uL (ref 150–400)
RBC: 3.13 MIL/uL — AB (ref 3.87–5.11)
RDW: 14.6 % (ref 11.5–15.5)
WBC: 6.6 10*3/uL (ref 4.0–10.5)

## 2013-04-24 LAB — BASIC METABOLIC PANEL
BUN: 11 mg/dL (ref 6–23)
CO2: 23 meq/L (ref 19–32)
Calcium: 8.5 mg/dL (ref 8.4–10.5)
Chloride: 106 mEq/L (ref 96–112)
Creatinine, Ser: 0.81 mg/dL (ref 0.50–1.10)
GFR calc Af Amer: >90 mL/min (ref >90)
GFR calc non Af Amer: 79 mL/min — ABNORMAL LOW (ref >90)
Glucose, Bld: 90 mg/dL (ref 70–99)
POTASSIUM: 3.9 meq/L (ref 3.7–5.3)
SODIUM: 141 meq/L (ref 137–147)

## 2013-04-24 MED ORDER — ASPIRIN 325 MG PO TBEC: 325.0000 mg | DELAYED_RELEASE_TABLET | Freq: Every day | ORAL | Status: DC

## 2013-04-24 MED ORDER — ASPIRIN EC 325 MG PO TBEC
325.0000 mg | DELAYED_RELEASE_TABLET | Freq: Every day | ORAL | Status: DC
  Administered 2013-04-24: 325 mg via ORAL
  Filled 2013-04-24: qty 1

## 2013-04-24 MED ORDER — PANTOPRAZOLE SODIUM 40 MG PO TBEC: 40.0000 mg | DELAYED_RELEASE_TABLET | Freq: Every day | ORAL | Status: DC

## 2013-04-24 NOTE — Discharge Instructions (Signed)
Cerebral Aneurysm An aneurysm is the bulging or ballooning out of part of the weakened wall of a vein or artery. An aneurysm in the vein or artery of the brain is called a brain aneurysm, or cerebral aneurysm.  Aneurysms are a risk to your health because they may leak or rupture. Once the aneurysm leaks or ruptures, bleeding occurs. If the bleeding occurs within the brain tissue, the condition is called an intracerebral hemorrhage. An intracerebral hemorrhage can result in a hemorrhagic stroke. If the bleeding occurs in the area between the brain and the thin tissues that cover the brain, the condition is called a subarachnoid hemorrhage. This increases the pressure on the brain and causes some areas of the brain to not get the necessary blood flow. The blood from the ruptured aneurysm collects and presses on the surrounding brain tissue. A subarachnoid hemorrhage can cause a stroke. A ruptured cerebral aneurysm is a medical emergency. This can cause permanent damage and loss of brain function. CAUSES A cerebral aneurysm is caused when a weakened part of the blood vessel expands. The blood vessel expands due to the constant pressure from the flow of blood through the weakened blood vessel. Usually the aneurysm expands slowly. As the weakened aneurysm expands, the walls of the aneurysm become weaker. Aneurysms may be associated with diseases that weaken and damage the walls of your blood vessels or blood vessels that develop abnormally. Some known causes for cerebral aneurysms are:  Head trauma.  Infection.  Use of "recreational drugs" such as cocaine or amphetamines. RISK FACTORS People at risk for a cerebral aneurysm or hemorrhagic stroke usually have one or more risk factors, which include:  Having high blood pressure (hypertension).  Abusing alcohol.  Having abnormal blood vessels present since birth.  Having certain bleeding disorders, such as hemophilia, sickle cell disease, or liver  disease.  Taking blood thinners (anticoagulants).  Smoking. SIGNS AND SYMPTOMS  The signs and symptoms of an unruptured cerebral aneurysm will partly depend on its size and rate of growth. A small, unchanging aneurysm generally does not produce symptoms. A larger aneurysm that is steadily growing can increase pressure on the brain or nerves. That increased pressure from the unruptured cerebral aneurysm can cause:  A headache.  Problems with your vision.  Numbness or weakness in an arm or leg.  Problems with memory.  Problems speaking.  Seizures. If an aneurysm leaks or bursts, it can cause a stroke and be life threatening. Symptoms may include:  A sudden, severe headache with no known cause. The headache is often described as the worst headache ever experienced.  Nausea or vomiting, especially when combined with other symptoms such as a headache.  Sudden weakness or numbness of the face, arm, or leg, especially on one side of the body.  Sudden trouble walking or difficulty moving arms or legs.  Sudden confusion.  Sudden personality changes.  Trouble speaking (aphasia) or understanding.  Difficulty swallowing.  Sudden trouble seeing in one or both eyes.  Double vision.  Dizziness.  Loss of balance or coordination.  Intolerance to light.  Stiff neck. DIAGNOSIS  A CTA (computed tomographic angiography) may be performed to diagnose an aneurysm. A CTA uses dye and a CT scanner to take images of your blood vessels. An MRA (magnetic resonance angiography) may be used to diagnose an aneurysm. An MRA is performed in an MRI machine. While in the MRI machine, images of your blood vessels are taken. A cerebral aneurysm may also be diagnosed with  a cerebral angiogram. A cerebral angiogram requires a tube called a catheter to be inserted into a blood vessel and advanced to the blood vessels in your neck. Dye is then injected while X-ray images are taken to show the blood vessels in  your brain. TREATMENT  Unruptured Aneurysms Treatment is complex when an aneurysm is found and it is not causing problems. Treatment is very individualized, as each case is different. Many things must be considered, such as the size and exact location of your aneurysm, your age, your overall health, and your feelings and preferences. Small aneurysms in certain locations of the brain have a very low chance of bleeding or rupturing. These small aneurysms may not be treated. However, depending on the size and location of the aneurysm, treatments may be recommended and include:  Coiling. During this procedure, a catheter is inserted and advanced through a blood vessel. Once the catheter reaches the aneurysm, tiny coils are used to block blood flow into the aneurysm.  Surgical clipping. During surgery, a clip is placed at the base of the aneurysm. The clip prevents blood from continuing to enter the aneurysm. Ruptured Aneurysms Immediate emergency surgery may be needed to help prevent damage to the brain and to reduce the risk of rebleeding. Timing of treatment is an important factor in the prevention of complications. Successful early treatment of a ruptured aneurysm (within the first 3 days of a bleed) helps to prevent rebleeding and blood vessel spasm. In some cases, there may be a reason to treat later (10 14 days after a rupture). Many things are considered when making this decision and each case is handled individually. HOME CARE INSTRUCTIONS  Take all your medicines exactly as instructed. Do not take any over-the-counter drugs or supplements without talking to your health care provider.  Eat healthy foods. It is recommended that you eat 5 or more servings of fruits and vegetables each day. Foods may need to be a special consistency (soft or pureed), or small bites may need to be taken if you have had a ruptured aneurysm or stroke. Certain diets may be prescribed to address high blood pressure, high  cholesterol, diabetes, or obesity.  A low salt (sodium), low saturated fat, low trans fat, low cholesterol diet is recommended to manage high blood pressure.  A low saturated fat, low trans fat, low cholesterol, and high fiber diet is recommended to control cholesterol levels.  A controlled carbohydrate, controlled sugar diet is recommended to manage diabetes.  A reduced calorie, low sodium, low saturated fat, low trans fat, low cholesterol diet is recommended to manage obesity.  Maintain a healthy weight.  Stay physically active. It is recommended that you get at least 30 minutes of activity on most or all days.  Do not smoke.  Limit alcohol use. Moderate alcohol use is considered to be:  No more than 2 drinks each day for men.  No more than 1 drink each day for nonpregnant women.  Stop drug abuse.  A safe home environment is important to reduce the risk of falls. Your health care provider may arrange for specialists to evaluate your home. Having grab bars in the bedroom and bathroom is often important. Your health care provider may arrange for special equipment to be used at home, such as raised toilets and a seat for the shower.  Physical, occupational, and speech therapy. Ongoing therapy may be needed to maximize your recovery after a ruptured aneurysm or stroke. If you have been advised to use  a walker or a cane, use it at all times. Be sure to keep your therapy appointments.  Follow all instructions for follow up with your health care provider. This is very important. This includes any referrals, physical therapy, rehabilitation, and laboratory tests. Proper follow up may prevent an aneurysm rupture or a stroke. SEEK IMMEDIATE MEDICAL CARE IF:  You have a sudden, severe headache with no known cause.  You have sudden nausea or vomiting with a severe headache.  You have sudden weakness or numbness of the face, arm, or leg, especially on one side of the body.  You have sudden  trouble walking or difficulty moving arms or legs.  You have sudden confusion.  You have trouble speaking or understanding.  You have sudden trouble seeing in one or both eyes.  You have a sudden loss of balance or coordination.  You have a stiff neck.  You have difficulty breathing.  You have a partial or total loss of consciousness. Any of these symptoms may represent a serious problem that is an emergency. Do not wait to see if the symptoms will go away. Get medical help at once. Call your local emergency services (911 in [place] Do not drive yourself to the hospital. Document Released: 10/01/2001 Document Revised: 10/30/2012 Document Reviewed: 06/27/2012 [facility] Patient Information 2014 [place].

## 2013-04-24 NOTE — Discharge Summary (Signed)
Physician Discharge Summary  Patient ID: MICHAELENE CERAR MRN: [id number] DOB/AGE: May 01, 1955 58 y.o.  Admit date: 04/23/2013 Discharge date: 04/24/2013  Admission Diagnoses: Active Problems:   Brain aneurysm  Discharge Diagnoses:  Active Problems:   Brain aneurysm  S/p embolization of brain aneurysm, Rt ICA superior hypophyseal region using pipeline flow diverter.  Procedures: Procedure(s): Rt common carotid arteriogram, followed By embolization of brain aneurysm, Rt ICA superior hypophyseal using pipeline flow diverter.  RADIOLOGY WITH ANESTHESIA  Discharged Condition: Good, stable.  Hospital Course: This is a 58 year old female who was seen in consult on 03/24/13 to discuss findings of recent cerebral arteriogram on 03/12/13 after c/o headaches and history of multiple intracranial aneurysm and treatment of left ICA superior hypophyseal region aneurysm. Findings include no evidence of coil compaction or recanalization of the previously treated left internal carotid artery the right ICA superior hypophyseal region aneurysm is stable at 2.5 mm x 2.2 mm in size, wide neck is again noted. There are additional findings of a 1.8 mm x 1.3 mm anterior communicating artery region aneurysm as well as a left-sided periophthalmic region aneurysm measuring 2.3 mm x 1.6 mm. It was recommended that treatment of the right ICA superior hypophyseal region aneurysm would be indicated and was offered. Treatment would be in the form of Pipeline flow diverter device, with or without additional coil embolization. The patient agreed to proceed and was started on aspirin and plavix. P2Y12 lab test was taken on 04/23/13 when she arrived as an outpatient for her procedure. Results of P2y12 include 265.  She presented as an outpatient on 4/1 and underwent successful embolization of brain aneurysm, Rt ICA superior hypophyseal region using pipeline flow diverter under general anesthesia. She was transferred to neuro ICU and stayed  the night for observation. She was extubated and vitals and labs monitored, she remained stable without complications. She denies any headache today, denies any lightheadedness, dizziness, nausea or vomiting. She denies any pain at her right groin site, fever or chills. She has ate a full breakfast today, urinated on her own after foley removal and ambulated the halls with no difficultly. She has been evaluated by Dr. Estanislado Pandy and myself and deemed stable for discharge. She was given instructions listed below and will follow up in our office in 2 weeks for evaluation and P2y12. The patient is to continue on plavix 75mg  daily after discharge and enteric coated aspirin and will be taken off her nexium and started on protonix and P2y12 checked again at 2 week follow-up.  She states she is ready to go home and understands the above, her family is with her today.   Consults: Anesthesia.  Discharge Exam: Blood pressure 116/62, pulse 61, temperature 98 F (36.7 C), temperature source Oral, resp. rate 13, height 5\' 10"  (1.778 m), weight 225 lb 1.4 oz (102.1 kg), SpO2 100.00%.  General: A&Ox3, NAD, sitting up in bed. Heart: RRR without M/G/R. Lungs: CTA bilaterally. Abd: Soft, NT, ND, (+) BS Ext: Right groin site dressing C/D/I, soft, no signs of bleeding, hematoma or infection, DP 2+ bilaterally. Neuro: CN 2-12 grossly intact, strength equal upper and lower extremities 5/5, no ataxia, smile symmetrical, tongue midline, able to whistle, speech clear.    Disposition: 01-Home or Self Care  Discharge Orders   Future Orders Complete By Expires   Call MD for:  difficulty breathing, headache or visual disturbances  As directed    Call MD for:  extreme fatigue  As directed    Call  MD for:  hives  As directed    Call MD for:  persistant dizziness or light-headedness  As directed    Call MD for:  persistant nausea and vomiting  As directed    Call MD for:  redness, tenderness, or signs of infection (pain,  swelling, redness, odor or green/yellow discharge around incision site)  As directed    Call MD for:  severe uncontrolled pain  As directed    Call MD for:  temperature >100.4  As directed    Diet - low sodium heart healthy  As directed    Driving Restrictions  As directed    Comments:     No driving x 2 weeks   Increase activity slowly  As directed    Lifting restrictions  As directed    Comments:     No bending, lifting >10 lbs or stooping x 2 weeks   Remove dressing in 24 hours  As directed        Medication List    STOP taking these medications       esomeprazole 20 MG capsule  Commonly known as:  [place] these medications       amLODipine-valsartan 10-320 MG per tablet  Commonly known as:  EXFORGE  Take 1 tablet by mouth daily.     aspirin 325 MG EC tablet  Take 1 tablet (325 mg total) by mouth daily with breakfast.     clopidogrel 75 MG tablet  Commonly known as:  PLAVIX  Take 75 mg by mouth daily with breakfast.     estrogens (conjugated) 0.9 MG tablet  Commonly known as:  PREMARIN  Take 0.9 mg by mouth daily.     Fish Oil 1200 MG Caps  Take 2,400 mg by mouth daily.     furosemide 40 MG tablet  Commonly known as:  LASIX  Take 20 mg by mouth daily.     Magnesium 250 MG Tabs  Take 500 mg by mouth daily.     metoprolol succinate 25 MG 24 hr tablet  Commonly known as:  TOPROL-XL  Take 1 tablet (25 mg total) by mouth daily.     pantoprazole 40 MG tablet  Commonly known as:  PROTONIX  Take 1 tablet (40 mg total) by mouth daily.     potassium chloride 10 MEQ tablet  Commonly known as:  K-DUR  Take 1 tablet (10 mEq total) by mouth daily.     vitamin B-12 1000 MCG tablet  Commonly known as:  CYANOCOBALAMIN  Take 1,000 mcg by mouth daily.           Follow-up Information   Follow up with Rob Hickman, MD In 2 weeks. (Our office will call with appointment time [phone])    Specialty:  Interventional Radiology   Contact  information:   [address] [place] [address] [phone]       Signed: Hedy Jacob PA-C 04/24/2013, 9:48 AM

## 2013-04-30 ENCOUNTER — Telehealth: Payer: Self-pay | Admitting: Cardiovascular Disease

## 2013-04-30 NOTE — Telephone Encounter (Signed)
New message   Patient calling stating someone call her today- returning call back

## 2013-04-30 NOTE — Telephone Encounter (Signed)
No notes are seen/ pt states that there was not a message just a number came up. Pt is not due for a f/u for 1 year/ unsure of why she had a call. Pt verbalized understanding.

## 2013-05-06 ENCOUNTER — Encounter: Payer: Self-pay | Admitting: Emergency Medicine

## 2013-05-06 ENCOUNTER — Ambulatory Visit (INDEPENDENT_AMBULATORY_CARE_PROVIDER_SITE_OTHER): Payer: BC Managed Care – PPO | Admitting: Emergency Medicine

## 2013-05-06 VITALS — BP 122/70 | HR 78 | Temp 97.8°F | Resp 18 | Ht 70.0 in | Wt 225.0 lb

## 2013-05-06 DIAGNOSIS — I10 Essential (primary) hypertension: Secondary | ICD-10-CM

## 2013-05-06 DIAGNOSIS — R7309 Other abnormal glucose: Secondary | ICD-10-CM

## 2013-05-06 DIAGNOSIS — E782 Mixed hyperlipidemia: Secondary | ICD-10-CM

## 2013-05-06 DIAGNOSIS — M549 Dorsalgia, unspecified: Secondary | ICD-10-CM

## 2013-05-06 LAB — LIPID PANEL
Cholesterol: 195 mg/dL (ref 0–200)
HDL: 75 mg/dL (ref >39)
LDL Cholesterol: 86 mg/dL (ref 0–99)
Total CHOL/HDL Ratio: 2.6 Ratio
Triglycerides: 170 mg/dL — ABNORMAL HIGH (ref <150)
VLDL: 34 mg/dL (ref 0–40)

## 2013-05-06 LAB — CBC WITH DIFFERENTIAL/PLATELET
Basophils Absolute: 0 10*3/uL (ref 0.0–0.1)
Basophils Relative: 0 % (ref 0–1)
EOS ABS: 0.2 10*3/uL (ref 0.0–0.7)
Eosinophils Relative: 2 % (ref 0–5)
HCT: 28.6 % — ABNORMAL LOW (ref 36.0–46.0)
HEMOGLOBIN: 9.7 g/dL — AB (ref 12.0–15.0)
LYMPHS ABS: 2.6 10*3/uL (ref 0.7–4.0)
LYMPHS PCT: 35 % (ref 12–46)
MCH: 28.4 pg (ref 26.0–34.0)
MCHC: 33.9 g/dL (ref 30.0–36.0)
MCV: 83.6 fL (ref 78.0–100.0)
Monocytes Absolute: 0.5 10*3/uL (ref 0.1–1.0)
Monocytes Relative: 6 % (ref 3–12)
NEUTROS PCT: 57 % (ref 43–77)
Neutro Abs: 4.3 10*3/uL (ref 1.7–7.7)
Platelets: 319 10*3/uL (ref 150–400)
RBC: 3.42 MIL/uL — AB (ref 3.87–5.11)
RDW: 15.2 % (ref 11.5–15.5)
WBC: 7.5 10*3/uL (ref 4.0–10.5)

## 2013-05-06 LAB — BASIC METABOLIC PANEL WITH GFR
BUN: 16 mg/dL (ref 6–23)
CHLORIDE: 101 meq/L (ref 96–112)
CO2: 29 meq/L (ref 19–32)
Calcium: 9.8 mg/dL (ref 8.4–10.5)
Creat: 1.55 mg/dL — ABNORMAL HIGH (ref 0.50–1.10)
GFR, Est African American: 43 mL/min — ABNORMAL LOW
GFR, Est Non African American: 37 mL/min — ABNORMAL LOW
Glucose, Bld: 85 mg/dL (ref 70–99)
Potassium: 4.8 mEq/L (ref 3.5–5.3)
Sodium: 137 mEq/L (ref 135–145)

## 2013-05-06 LAB — HEPATIC FUNCTION PANEL
ALT: 10 U/L (ref 0–35)
AST: 17 U/L (ref 0–37)
Albumin: 4 g/dL (ref 3.5–5.2)
Alkaline Phosphatase: 56 U/L (ref 39–117)
BILIRUBIN INDIRECT: 0.2 mg/dL (ref 0.2–1.2)
Bilirubin, Direct: 0.1 mg/dL (ref 0.0–0.3)
TOTAL PROTEIN: 7.2 g/dL (ref 6.0–8.3)
Total Bilirubin: 0.3 mg/dL (ref 0.2–1.2)

## 2013-05-06 LAB — HEMOGLOBIN A1C
HEMOGLOBIN A1C: 5.5 % (ref <5.7)
MEAN PLASMA GLUCOSE: 111 mg/dL (ref <117)

## 2013-05-06 NOTE — Patient Instructions (Signed)
Back Pain, Adult  Back pain is very common. The pain often gets better over time. The cause of back pain is usually not dangerous. Most people can learn to manage their back pain on their own.   HOME CARE   · Stay active. Start with short walks on flat ground if you can. Try to walk farther each day.  · Do not sit, drive, or stand in one place for more than 30 minutes. Do not stay in bed.  · Do not avoid exercise or work. Activity can help your back heal faster.  · Be careful when you bend or lift an object. Bend at your knees, keep the object close to you, and do not twist.  · Sleep on a firm mattress. Lie on your side, and bend your knees. If you lie on your back, put a pillow under your knees.  · Only take medicines as told by your doctor.  · Put ice on the injured area.  · Put ice in a plastic bag.  · Place a towel between your skin and the bag.  · Leave the ice on for 15-20 minutes, 03-04 times a day for the first 2 to 3 days. After that, you can switch between ice and heat packs.  · Ask your doctor about back exercises or massage.  · Avoid feeling anxious or stressed. Find good ways to deal with stress, such as exercise.  GET HELP RIGHT AWAY IF:   · Your pain does not go away with rest or medicine.  · Your pain does not go away in 1 week.  · You have new problems.  · You do not feel well.  · The pain spreads into your legs.  · You cannot control when you poop (bowel movement) or pee (urinate).  · Your arms or legs feel weak or lose feeling (numbness).  · You feel sick to your stomach (nauseous) or throw up (vomit).  · You have belly (abdominal) pain.  · You feel like you may pass out (faint).  MAKE SURE YOU:   · Understand these instructions.  · Will watch your condition.  · Will get help right away if you are not doing well or get worse.  Document Released: 06/28/2007 Document Revised: 04/03/2011 Document Reviewed: 05/30/2010  [employer]® Patient Information ©2014 [employer].

## 2013-05-06 NOTE — Progress Notes (Signed)
Subjective:    Patient ID: Yolanda Espinoza, female    DOB: 15-May-1955, 58 y.o.   MRN: [id number]  HPI Comments: 58 yo AAF had recent aneurysm stenting in right frontal area. The procedure was 04/23/13 and the 2nd one she has had done,she has to others that are being monitored. She went to work 2 weeks after surgery and mild low back pain from where cath was placed in groin. She notes she had been just laying around house and was not very active but after she moves around a little it improves. She notes standing/ working for long hours makes it worse.  She has not had anything for pain. She is on Plavix and has f/u on Thursday.   She is due for presents for 3 month F/U for HTN, Cholesterol, Pre-Dm, D. Deficient. She has not been on fish oil since surgery with PLavix and has not been exercising routinely x 2 weeks due to surgery.  She has had mild back pain/ stiffness and has not been exercising because of this. She notes BP was good in hospital.  CHOL         165   11/25/2012 HDL           72   11/25/2012 LDLCALC       62   11/25/2012 TRIG         153   11/25/2012 CHOLHDL      2.3   11/25/2012 ALT            8   04/16/2013 AST           18   04/16/2013 ALKPHOS       51   04/16/2013 BILITOT     <0.2   04/16/2013 CREATININE     0.81   04/24/2013 BUN              11   04/24/2013 NA              141   04/24/2013 K               3.9   04/24/2013 CL              106   04/24/2013 CO2              23   04/24/2013 WBC      6.6   04/24/2013 HGB      8.9   04/24/2013 HCT     26.8   04/24/2013 MCV     85.6   04/24/2013 PLT      237   04/24/2013 HGBA1C      5.8   11/25/2012   Hypertension  Gastrophageal Reflux Associated symptoms include fatigue.     Medication List       This list is accurate as of: 05/06/13  3:01 PM.  Always use your most recent med list.               amLODipine-valsartan 10-320 MG per tablet  Commonly known as:  EXFORGE  Take 1 tablet by mouth daily.     aspirin 325 MG EC tablet  Take 1 tablet  (325 mg total) by mouth daily with breakfast.     clopidogrel 75 MG tablet  Commonly known as:  PLAVIX  Take 75 mg by mouth daily with breakfast.     estrogens (conjugated) 0.9 MG tablet  Commonly known as:  PREMARIN  Take 0.9 mg by mouth daily.  Fish Oil 1200 MG Caps  Take 2,400 mg by mouth daily.     furosemide 40 MG tablet  Commonly known as:  LASIX  Take 20 mg by mouth daily.     Magnesium 250 MG Tabs  Take 500 mg by mouth daily.     metoprolol succinate 25 MG 24 hr tablet  Commonly known as:  TOPROL-XL  Take 1 tablet (25 mg total) by mouth daily.     pantoprazole 40 MG tablet  Commonly known as:  PROTONIX  Take 1 tablet (40 mg total) by mouth daily.     potassium chloride 10 MEQ tablet  Commonly known as:  K-DUR  Take 1 tablet (10 mEq total) by mouth daily.     vitamin B-12 1000 MCG tablet  Commonly known as:  CYANOCOBALAMIN  Take 1,000 mcg by mouth daily.       Allergies  Allergen Reactions  . Aspirin Nausea And Vomiting  . Ace Inhibitors    Past Medical History  Diagnosis Date  . Subarachnoid hemorrhage due to ruptured aneurysm     2003 - s/p coiling  . Hypertension may, 1973  . Mitral regurgitation     a. Echo 01/2012 mild LVH, EF 55-65%, Gr 2 DD, mod MR, mild LAE, PASP 36;  b. Echo (02/2013):  EF 55-60%, Gr 1 DD, mild to mod MR, mild LAE (no sig change since prior echo)  . Chronic diastolic heart failure   . Hyperlipidemia   . Unspecified vitamin D deficiency   . Heart murmur   . Stroke 07    no weakness  . Peripheral vascular disease     cerebral aneursym  . OSA on CPAP     7 yrs  . GERD (gastroesophageal reflux disease)   . Arthritis       Review of Systems  Constitutional: Positive for fatigue.  Musculoskeletal: Positive for back pain.  All other systems reviewed and are negative.  BP 122/70  Pulse 78  Temp(Src) 97.8 F (36.6 C) (Temporal)  Resp 18  Ht 5\' 10"  (1.778 m)  Wt 225 lb (102.059 kg)  BMI 32.28 kg/m2    Objective:    Physical Exam  Nursing note and vitals reviewed. Constitutional: She is oriented to person, place, and time. She appears well-developed and well-nourished. No distress.  HENT:  Head: Normocephalic and atraumatic.  Right Ear: External ear normal.  Left Ear: External ear normal.  Nose: Nose normal.  Mouth/Throat: Oropharynx is clear and moist.  Eyes: Conjunctivae and EOM are normal.  Neck: Normal range of motion. Neck supple. No JVD present. No thyromegaly present.  Cardiovascular: Normal rate, regular rhythm, normal heart sounds and intact distal pulses.   Pulmonary/Chest: Effort normal and breath sounds normal.  Abdominal: Soft. Bowel sounds are normal. She exhibits no distension and no mass. There is no tenderness. There is no rebound and no guarding.  Musculoskeletal: Normal range of motion. She exhibits no edema and no tenderness.  Mild discomfort with ROM of lumbar spine  Lymphadenopathy:    She has no cervical adenopathy.  Neurological: She is alert and oriented to person, place, and time. No cranial nerve deficit.  Skin: Skin is warm and dry. No rash noted. No erythema. No pallor.  Psychiatric: She has a normal mood and affect. Her behavior is normal. Judgment and thought content normal.          Assessment & Plan:  1.  3 month F/U for HTN, Cholesterol, Pre-Dm, D. Deficient. Needs healthy  diet, cardio QD and obtain healthy weight. Check Labs, Check BP if >130/80 call office  2. Back pain since recent surgery vs decreased activity from recent surgery with stiffness- Tylenol 650  AD, Heat/ stretch/ Ice, w/c if SX increase or ER.

## 2013-06-12 ENCOUNTER — Ambulatory Visit (HOSPITAL_COMMUNITY)
Admission: RE | Admit: 2013-06-12 | Discharge: 2013-06-12 | Disposition: A | Discharge status: Home / Self Care | Payer: BC Managed Care – PPO | Source: Ambulatory Visit | Attending: Radiology | Admitting: Radiology

## 2013-06-12 ENCOUNTER — Other Ambulatory Visit: Payer: Self-pay | Admitting: Radiology

## 2013-06-12 DIAGNOSIS — I671 Cerebral aneurysm, nonruptured: Secondary | ICD-10-CM | POA: Insufficient documentation

## 2013-06-12 LAB — PLATELET INHIBITION P2Y12: PLATELET FUNCTION P2Y12: 3 [PRU] — AB (ref 194–418)

## 2013-06-13 ENCOUNTER — Telehealth (HOSPITAL_COMMUNITY): Payer: Self-pay | Admitting: Radiology

## 2013-06-13 ENCOUNTER — Telehealth (HOSPITAL_COMMUNITY): Payer: Self-pay | Admitting: Interventional Radiology

## 2013-06-13 NOTE — Telephone Encounter (Signed)
Called pt reschedule consult f/u [employer]. Phone message said pt was not accepting calls at this time. I will try her back again. JMichaux

## 2013-06-27 ENCOUNTER — Telehealth (HOSPITAL_COMMUNITY): Payer: Self-pay | Admitting: Interventional Radiology

## 2013-06-27 NOTE — Telephone Encounter (Signed)
Pt called and requested refill of her Protonix 40mg  1 q d. This was called in to [facility] on [facility] [phone]) Protonix 40mg  1 qd x60 tablets w/ 1 refill. Called pt and told her that her medication had been called in, also rescheduled her consult, and told her that due to her last P2Y12 result she should decrease her Aspirin 81mg  1 qd and change her Plavix to 75mg  1 qod.  Pt states understanding and is in agreement with this plan of care. JMichaux

## 2013-07-09 ENCOUNTER — Ambulatory Visit (HOSPITAL_COMMUNITY)
Admission: RE | Admit: 2013-07-09 | Discharge: 2013-07-09 | Disposition: A | Discharge status: Home / Self Care | Payer: BC Managed Care – PPO | Source: Ambulatory Visit | Attending: Radiology | Admitting: Radiology

## 2013-07-15 ENCOUNTER — Other Ambulatory Visit: Payer: Self-pay | Admitting: Radiology

## 2013-07-15 ENCOUNTER — Telehealth (HOSPITAL_COMMUNITY): Payer: Self-pay | Admitting: Interventional Radiology

## 2013-07-15 NOTE — Telephone Encounter (Signed)
Called pt, no answer and no VM. Sent pt an email reminding her to come by the lab today for her P2Y12 blood draw that is due. JMichaux

## 2013-07-16 ENCOUNTER — Other Ambulatory Visit: Payer: Self-pay | Admitting: Radiology

## 2013-07-16 ENCOUNTER — Ambulatory Visit (HOSPITAL_COMMUNITY)
Admission: AD | Admit: 2013-07-16 | Discharge: 2013-07-16 | Disposition: A | Discharge status: Home / Self Care | Payer: BC Managed Care – PPO | Source: Ambulatory Visit | Attending: Interventional Radiology | Admitting: Interventional Radiology

## 2013-07-16 DIAGNOSIS — Z0189 Encounter for other specified special examinations: Secondary | ICD-10-CM | POA: Insufficient documentation

## 2013-07-16 DIAGNOSIS — I671 Cerebral aneurysm, nonruptured: Secondary | ICD-10-CM | POA: Insufficient documentation

## 2013-07-16 LAB — PLATELET INHIBITION P2Y12: PLATELET FUNCTION P2Y12: 46 [PRU] — AB (ref 194–418)

## 2013-07-21 ENCOUNTER — Other Ambulatory Visit: Payer: Self-pay | Admitting: Physician Assistant

## 2013-07-22 ENCOUNTER — Telehealth (HOSPITAL_COMMUNITY): Payer: Self-pay | Admitting: Interventional Radiology

## 2013-07-22 NOTE — Telephone Encounter (Signed)
Called pt, told her to continue on her Aspirin 81mg  1 qd and Plavix 75mg  1 qod per Dr. Estanislado Pandy. Told her that I would be contacting her to schedule her upcoming angiogram in July as well as her recheck P2Y12. Pt states understanding and is in agreement with this plan of care. JMichaux

## 2013-07-25 ENCOUNTER — Other Ambulatory Visit: Payer: Self-pay | Admitting: Physician Assistant

## 2013-08-07 ENCOUNTER — Telehealth (HOSPITAL_COMMUNITY): Payer: Self-pay | Admitting: Interventional Radiology

## 2013-08-07 ENCOUNTER — Other Ambulatory Visit (HOSPITAL_COMMUNITY): Payer: Self-pay | Admitting: Interventional Radiology

## 2013-08-07 DIAGNOSIS — I729 Aneurysm of unspecified site: Secondary | ICD-10-CM

## 2013-08-07 NOTE — Telephone Encounter (Signed)
Called pt on her cell phone, no answer and no VM. Called pt's work, she was not in today. Will try to call back later. JM

## 2013-08-12 ENCOUNTER — Encounter (HOSPITAL_COMMUNITY): Payer: Self-pay | Admitting: Pharmacy Technician

## 2013-08-14 ENCOUNTER — Encounter: Payer: Self-pay | Admitting: Emergency Medicine

## 2013-08-18 ENCOUNTER — Other Ambulatory Visit: Payer: Self-pay | Admitting: Radiology

## 2013-08-19 ENCOUNTER — Encounter: Payer: Self-pay | Admitting: Internal Medicine

## 2013-08-19 ENCOUNTER — Other Ambulatory Visit: Payer: Self-pay | Admitting: Radiology

## 2013-08-20 ENCOUNTER — Ambulatory Visit (HOSPITAL_COMMUNITY)
Admission: RE | Admit: 2013-08-20 | Discharge: 2013-08-20 | Disposition: A | Discharge status: Home / Self Care | Payer: BC Managed Care – PPO | Source: Ambulatory Visit | Attending: Interventional Radiology | Admitting: Interventional Radiology

## 2013-08-20 ENCOUNTER — Other Ambulatory Visit (HOSPITAL_COMMUNITY): Payer: Self-pay | Admitting: Interventional Radiology

## 2013-08-20 ENCOUNTER — Other Ambulatory Visit: Payer: Self-pay | Admitting: Physician Assistant

## 2013-08-20 DIAGNOSIS — I5032 Chronic diastolic (congestive) heart failure: Secondary | ICD-10-CM | POA: Diagnosis not present

## 2013-08-20 DIAGNOSIS — Z9989 Dependence on other enabling machines and devices: Secondary | ICD-10-CM | POA: Diagnosis not present

## 2013-08-20 DIAGNOSIS — Z48812 Encounter for surgical aftercare following surgery on the circulatory system: Secondary | ICD-10-CM | POA: Diagnosis present

## 2013-08-20 DIAGNOSIS — I10 Essential (primary) hypertension: Secondary | ICD-10-CM | POA: Diagnosis not present

## 2013-08-20 DIAGNOSIS — Z7902 Long term (current) use of antithrombotics/antiplatelets: Secondary | ICD-10-CM | POA: Insufficient documentation

## 2013-08-20 DIAGNOSIS — Z87891 Personal history of nicotine dependence: Secondary | ICD-10-CM | POA: Diagnosis not present

## 2013-08-20 DIAGNOSIS — K219 Gastro-esophageal reflux disease without esophagitis: Secondary | ICD-10-CM | POA: Insufficient documentation

## 2013-08-20 DIAGNOSIS — E785 Hyperlipidemia, unspecified: Secondary | ICD-10-CM | POA: Insufficient documentation

## 2013-08-20 DIAGNOSIS — G4733 Obstructive sleep apnea (adult) (pediatric): Secondary | ICD-10-CM | POA: Insufficient documentation

## 2013-08-20 DIAGNOSIS — I729 Aneurysm of unspecified site: Secondary | ICD-10-CM

## 2013-08-20 DIAGNOSIS — Z7982 Long term (current) use of aspirin: Secondary | ICD-10-CM | POA: Insufficient documentation

## 2013-08-20 DIAGNOSIS — Z8673 Personal history of transient ischemic attack (TIA), and cerebral infarction without residual deficits: Secondary | ICD-10-CM | POA: Diagnosis not present

## 2013-08-20 LAB — BASIC METABOLIC PANEL
Anion gap: 14 (ref 5–15)
BUN: 18 mg/dL (ref 6–23)
CALCIUM: 9.3 mg/dL (ref 8.4–10.5)
CO2: 26 mEq/L (ref 19–32)
Chloride: 102 mEq/L (ref 96–112)
Creatinine, Ser: 1.01 mg/dL (ref 0.50–1.10)
GFR calc Af Amer: 70 mL/min — ABNORMAL LOW (ref >90)
GFR, EST NON AFRICAN AMERICAN: 61 mL/min — AB (ref >90)
Glucose, Bld: 99 mg/dL (ref 70–99)
Potassium: 3.9 mEq/L (ref 3.7–5.3)
SODIUM: 142 meq/L (ref 137–147)

## 2013-08-20 LAB — CBC WITH DIFFERENTIAL/PLATELET
BASOS ABS: 0 10*3/uL (ref 0.0–0.1)
Basophils Relative: 0 % (ref 0–1)
EOS ABS: 0.3 10*3/uL (ref 0.0–0.7)
EOS PCT: 4 % (ref 0–5)
HCT: 30.9 % — ABNORMAL LOW (ref 36.0–46.0)
Hemoglobin: 10 g/dL — ABNORMAL LOW (ref 12.0–15.0)
LYMPHS PCT: 49 % — AB (ref 12–46)
Lymphs Abs: 3.7 10*3/uL (ref 0.7–4.0)
MCH: 28.2 pg (ref 26.0–34.0)
MCHC: 32.4 g/dL (ref 30.0–36.0)
MCV: 87 fL (ref 78.0–100.0)
Monocytes Absolute: 0.4 10*3/uL (ref 0.1–1.0)
Monocytes Relative: 5 % (ref 3–12)
Neutro Abs: 3.2 10*3/uL (ref 1.7–7.7)
Neutrophils Relative %: 42 % — ABNORMAL LOW (ref 43–77)
PLATELETS: 269 10*3/uL (ref 150–400)
RBC: 3.55 MIL/uL — ABNORMAL LOW (ref 3.87–5.11)
RDW: 14.6 % (ref 11.5–15.5)
WBC: 7.6 10*3/uL (ref 4.0–10.5)

## 2013-08-20 LAB — PROTIME-INR
INR: 0.91 (ref 0.00–1.49)
Prothrombin Time: 12.3 seconds (ref 11.6–15.2)

## 2013-08-20 LAB — APTT: aPTT: 30 seconds (ref 24–37)

## 2013-08-20 MED ORDER — IOHEXOL 300 MG/ML  SOLN
150.0000 mL | Freq: Once | INTRAMUSCULAR | Status: AC | PRN
  Administered 2013-08-20: 30 mL via INTRA_ARTERIAL

## 2013-08-20 MED ORDER — MIDAZOLAM HCL 2 MG/2ML IJ SOLN
INTRAMUSCULAR | Status: AC
  Filled 2013-08-20: qty 2

## 2013-08-20 MED ORDER — FENTANYL CITRATE 0.05 MG/ML IJ SOLN
INTRAMUSCULAR | Status: AC | PRN
  Administered 2013-08-20: 25 ug via INTRAVENOUS

## 2013-08-20 MED ORDER — SODIUM CHLORIDE 0.9 % IV SOLN
INTRAVENOUS | Status: AC | PRN
  Administered 2013-08-20: 75 mL/h via INTRAVENOUS

## 2013-08-20 MED ORDER — SODIUM CHLORIDE 0.9 % IV SOLN
INTRAVENOUS | Status: DC
  Administered 2013-08-20: 08:00:00 via INTRAVENOUS

## 2013-08-20 MED ORDER — MIDAZOLAM HCL 2 MG/2ML IJ SOLN
INTRAMUSCULAR | Status: AC | PRN
  Administered 2013-08-20: 1 mg via INTRAVENOUS

## 2013-08-20 MED ORDER — SODIUM CHLORIDE 0.9 % IV SOLN
INTRAVENOUS | Status: AC
Start: 2013-08-20 — End: 2013-08-20

## 2013-08-20 MED ORDER — HEPARIN SODIUM (PORCINE) 1000 UNIT/ML IJ SOLN
INTRAMUSCULAR | Status: AC | PRN
  Administered 2013-08-20: 1000 [IU] via INTRAVENOUS

## 2013-08-20 MED ORDER — FENTANYL CITRATE 0.05 MG/ML IJ SOLN
INTRAMUSCULAR | Status: AC
  Filled 2013-08-20: qty 2

## 2013-08-20 NOTE — Discharge Instructions (Signed)

## 2013-08-20 NOTE — Procedures (Signed)
S/P Rt common carotid arteriogram, Rt CFAc approach. Findings.Marland Kitchen 1.Obliterated  Rt sup hypophyseal  Aneurysm with patency of pipeline flow diverter.

## 2013-08-20 NOTE — H&P (Signed)
Yolanda Espinoza is an 58 y.o. female.   Chief Complaint: "I'm having an angiogram" HPI: Patient with history of intracranial aneurysms treated endovascularly with most recent involving embolization of unruptured right ICA superior hypophyseal region aneurysm using pipeline flow diverter in 04/2013. She presents today for follow up cerebral arteriogram to assess stability.  Past Medical History  Diagnosis Date  . Subarachnoid hemorrhage due to ruptured aneurysm     2003 - s/p coiling  . Hypertension may, 1973  . Mitral regurgitation     a. Echo 01/2012 mild LVH, EF 55-65%, Gr 2 DD, mod MR, mild LAE, PASP 36;  b. Echo (02/2013):  EF 55-60%, Gr 1 DD, mild to mod MR, mild LAE (no sig change since prior echo)  . Chronic diastolic heart failure   . Hyperlipidemia   . Unspecified vitamin Yolanda deficiency   . Heart murmur   . Stroke 07    no weakness  . Peripheral vascular disease     cerebral aneursym  . OSA on CPAP     7 yrs  . GERD (gastroesophageal reflux disease)   . Arthritis     Past Surgical History  Procedure Laterality Date  . Aneurysm coiling    . Head surgery      aneurysm ruptured- stroke  . Abdominal hysterectomy    . Tonsillectomy and adenoidectomy    . Radiology with anesthesia N/A 04/23/2013    Procedure: RADIOLOGY WITH ANESTHESIA;  Surgeon: Rob Hickman, MD;  Location: [facility];  Service: Radiology;  Laterality: N/A;    Family History  Problem Relation Age of Onset  . Hypertension Father   . Kidney disease Father   . Hypertension Sister   . Hypertension Sister   . Hypertension Sister    Social History:  reports that she quit smoking about 14 years ago. Her smoking use included Cigarettes. She has a .75 pack-year smoking history. She does not have any smokeless tobacco history on file. She reports that she does not drink alcohol or use illicit drugs.  Allergies:  Allergies  Allergen Reactions  . Aspirin Nausea And Vomiting  . Ace Inhibitors Nausea And Vomiting     Current outpatient prescriptions:amLODipine-valsartan (EXFORGE) 10-320 MG per tablet, Take 1 tablet by mouth daily., Disp: , Rfl: ;  aspirin EC 81 MG tablet, Take 81 mg by mouth daily., Disp: , Rfl: ;  clopidogrel (PLAVIX) 75 MG tablet, Take 75 mg by mouth every other day. , Disp: , Rfl: ;  estrogens, conjugated, (PREMARIN) 0.9 MG tablet, Take 0.9 mg by mouth daily., Disp: , Rfl:  furosemide (LASIX) 40 MG tablet, Take 20-40 mg by mouth daily. Takes 1/2 tablet one day and 1 tablet the next alternating, Disp: , Rfl: ;  metoprolol succinate (TOPROL-XL) 25 MG 24 hr tablet, Take 1 tablet (25 mg total) by mouth daily., Disp: 30 tablet, Rfl: 11;  Multiple Vitamins-Minerals (MULTIVITAMIN WITH MINERALS) tablet, Take 1 tablet by mouth daily., Disp: , Rfl:  Omega-3 Fatty Acids (FISH OIL) 1200 MG CAPS, Take 2,400 mg by mouth daily. , Disp: , Rfl: ;  pantoprazole (PROTONIX) 40 MG tablet, Take 1 tablet (40 mg total) by mouth daily., Disp: , Rfl: ;  potassium chloride (K-DUR) 10 MEQ tablet, TAKE ONE TABLET BY MOUTH ONCE DAILY, Disp: 90 tablet, Rfl: 1;  vitamin B-12 (CYANOCOBALAMIN) 1000 MCG tablet, Take 1,000 mcg by mouth daily., Disp: , Rfl:  Current facility-administered medications:0.9 %  sodium chloride infusion, , Intravenous, Continuous, Yolanda Dike, PA-C, Last Rate:  75 mL/hr at 08/20/13 0811 No results found. 08/20/13 LABS PENDING Review of Systems  Constitutional: Negative for fever and chills.  HENT:       Intermittent HA's  Respiratory: Negative for hemoptysis and shortness of breath.        Occ dry cough  Cardiovascular: Negative for chest pain.  Gastrointestinal: Negative for nausea, vomiting, abdominal pain and blood in stool.  Genitourinary: Negative for hematuria.  Musculoskeletal: Negative for back pain.  Neurological: Negative for dizziness, sensory change, speech change, focal weakness, loss of consciousness and weakness.    Blood pressure 132/69, pulse 63, temperature 98.2 F (36.8 C),  temperature source Oral, resp. rate 20, height 5\' 10"  (1.778 m), weight 221 lb (100.245 kg), SpO2 100.00%. Physical Exam  Constitutional: She is oriented to person, place, and time. She appears well-developed and well-nourished.  Cardiovascular: Normal rate and regular rhythm.   Murmur heard. Respiratory: Effort normal and breath sounds normal.  GI: Soft. Bowel sounds are normal. There is tenderness.  Musculoskeletal: Normal range of motion. She exhibits no edema.  Neurological: She is alert and oriented to person, place, and time. No cranial nerve deficit. Coordination normal.  Skin: Skin is warm and dry.     Assessment/Plan Patient with history of intracranial aneurysms treated endovascularly with most recent involving embolization of unruptured right ICA superior hypophyseal region aneurysm using pipeline flow diverter in 04/2013. She presents today for follow up cerebral arteriogram to assess stability. Details/risks of procedure Yolanda/w pt/husband with their understanding and consent.  Yolanda Espinoza,Yolanda Espinoza 08/20/2013, 8:09 AM

## 2013-08-25 ENCOUNTER — Ambulatory Visit (INDEPENDENT_AMBULATORY_CARE_PROVIDER_SITE_OTHER): Payer: BC Managed Care – PPO | Admitting: Emergency Medicine

## 2013-08-25 ENCOUNTER — Encounter: Payer: Self-pay | Admitting: Emergency Medicine

## 2013-08-25 ENCOUNTER — Other Ambulatory Visit: Payer: Self-pay | Admitting: Emergency Medicine

## 2013-08-25 VITALS — BP 130/74 | HR 92 | Temp 97.7°F | Resp 18 | Ht 70.5 in | Wt 225.4 lb

## 2013-08-25 DIAGNOSIS — R05 Cough: Secondary | ICD-10-CM

## 2013-08-25 DIAGNOSIS — I10 Essential (primary) hypertension: Secondary | ICD-10-CM

## 2013-08-25 DIAGNOSIS — Z0000 Encounter for general adult medical examination without abnormal findings: Secondary | ICD-10-CM

## 2013-08-25 DIAGNOSIS — Z79899 Other long term (current) drug therapy: Secondary | ICD-10-CM

## 2013-08-25 DIAGNOSIS — E559 Vitamin D deficiency, unspecified: Secondary | ICD-10-CM

## 2013-08-25 DIAGNOSIS — R059 Cough, unspecified: Secondary | ICD-10-CM

## 2013-08-25 DIAGNOSIS — L732 Hidradenitis suppurativa: Secondary | ICD-10-CM

## 2013-08-25 DIAGNOSIS — R011 Cardiac murmur, unspecified: Secondary | ICD-10-CM

## 2013-08-25 DIAGNOSIS — Z23 Encounter for immunization: Secondary | ICD-10-CM

## 2013-08-25 NOTE — Patient Instructions (Signed)

## 2013-08-25 NOTE — Progress Notes (Signed)
Subjective:    Patient ID: Yolanda Espinoza, female    DOB: 1955/02/21, 58 y.o.   MRN: [id number]  HPI Comments: 58 yo pleasant AAF CPE and presents for  F/U for HTN, Cholesterol, Pre-Dm, D. Deficient. Yolanda Espinoza is doing well overall but is not exercising routinely with heat. Yolanda Espinoza is trying to improve diet.  Yolanda Espinoza had f/u for Cerebral aneurysm with pipeline placed early this week and Yolanda Espinoza is still having some discomfort in Yolanda Espinoza groin area with small knot and tenderness with change of positions. Yolanda Espinoza notes angiogram was good. Yolanda Espinoza will d/c Plavix and start 325 mg ASA at the end of the month.   Yolanda Espinoza has had dry cough x 3 days but does not think Yolanda Espinoza is sick. Yolanda Espinoza denies reflux but notes mild allergy drainage.   Yolanda Espinoza has had some bumps in Yolanda Espinoza axillas on/ off since higher temperatures. Yolanda Espinoza denies redness or exudate. Yolanda Espinoza notes Yolanda Espinoza has had to have some removed in the past.    WBC             7.6   08/20/2013 HGB            10.0   08/20/2013 HCT            30.9   08/20/2013 PLT             269   08/20/2013 GLUCOSE          99   08/20/2013 CHOL            195   05/06/2013 TRIG            170   05/06/2013 HDL              75   05/06/2013 LDLCALC          86   05/06/2013 ALT              10   05/06/2013 AST              17   05/06/2013 NA              142   08/20/2013 K               3.9   08/20/2013 CL              102   08/20/2013 CREATININE     1.01   08/20/2013 BUN              18   08/20/2013 CO2              26   08/20/2013 INR            0.91   08/20/2013 HGBA1C          5.5   05/06/2013   Cough     Medication List       This list is accurate as of: 08/25/13 11:59 PM.  Always use your most recent med list.               amLODipine-valsartan 10-320 MG per tablet  Commonly known as:  EXFORGE  Take 1 tablet by mouth daily.     aspirin EC 81 MG tablet  Take 325 mg by mouth daily.     clopidogrel 75 MG tablet  Commonly known as:  PLAVIX  Take 75 mg by mouth every other day.     estrogens (conjugated) 0.9  MG tablet  Commonly known as:  PREMARIN  Take 0.9 mg by mouth daily.     Fish Oil 1200 MG Caps  Take 2,400 mg by mouth daily.     furosemide 40 MG tablet  Commonly known as:  LASIX  TAKE ONE TABLET BY MOUTH ONCE DAILY     furosemide 40 MG tablet  Commonly known as:  LASIX  Take 20-40 mg by mouth daily. Takes 1/2 tablet one day and 1 tablet the next alternating     metoprolol succinate 25 MG 24 hr tablet  Commonly known as:  TOPROL-XL  Take 1 tablet (25 mg total) by mouth daily.     multivitamin with minerals tablet  Take 1 tablet by mouth daily.     pantoprazole 40 MG tablet  Commonly known as:  PROTONIX  Take 1 tablet (40 mg total) by mouth daily.     potassium chloride 10 MEQ tablet  Commonly known as:  K-DUR  TAKE ONE TABLET BY MOUTH ONCE DAILY     vitamin B-12 1000 MCG tablet  Commonly known as:  CYANOCOBALAMIN  Take 1,000 mcg by mouth daily.        MAINTENANCE: Colonoscopy:2011 polyp Mammo:02/10/13 BMD:  Pap/ Pelvic:>5 years EYE:07/2013 wnl Dentist:yearly dentures PPD: at work NEG CXR:3/15 ECHO 02/26/13  IMMUNIZATIONS: Tdap:08/25/13 Pneumovax: n/a Zostavax:n/a Influenza:2014  Patient Care Team: Liliane Shi, PA-C as Physician Assistant (Physician Assistant) Juanita Craver, MD as Consulting Physician (Gastroenterology) Mcarthur Rossetti, MD as Consulting Physician (Orthopedic Surgery) Unk Pinto, MD as Consulting Physician (Internal Medicine) Ardis Hughs, PA-C as Physician Assistant (Emergency Medicine) Rob Hickman, MD as Consulting Physician (Interventional Radiology) [facility] No Dentist with Dentures  Review of Systems  Respiratory: Positive for cough.   Skin: Positive for wound.  All other systems reviewed and are negative.  BP 130/74  Pulse 92  Temp(Src) 97.7 F (36.5 C)  Resp 18  Ht 5' 10.5" (1.791 m)  Wt 225 lb 6.4 oz (102.241 kg)  BMI 31.87 kg/m2     Objective:   Physical Exam  Nursing note and vitals  reviewed. Constitutional: Yolanda Espinoza is oriented to person, place, and time. Yolanda Espinoza appears well-developed and well-nourished. No distress.  overweight  HENT:  Head: Normocephalic and atraumatic.  Right Ear: External ear normal.  Left Ear: External ear normal.  Nose: Nose normal.  Mouth/Throat: Oropharynx is clear and moist.  Eyes: Conjunctivae and EOM are normal. Pupils are equal, round, and reactive to light. Right eye exhibits no discharge. Left eye exhibits no discharge. No scleral icterus.  Neck: Normal range of motion. Neck supple. No JVD present. No tracheal deviation present. No thyromegaly present.  Cardiovascular: Normal rate, regular rhythm and intact distal pulses.   Murmur heard. 3/6  Pulmonary/Chest: Effort normal and breath sounds normal.  Abdominal: Soft. Bowel sounds are normal. Yolanda Espinoza exhibits no distension and no mass. There is no tenderness. There is no rebound and no guarding.  Genitourinary:  Breast wnl Unable to perform manual exam due to discomfort in groin from recent procedure  Musculoskeletal: Yolanda Espinoza exhibits no edema and no tenderness.  Decreased right hip with recent tenderness from procedure  Lymphadenopathy:    Yolanda Espinoza has no cervical adenopathy.  Neurological: Yolanda Espinoza is alert and oriented to person, place, and time. Yolanda Espinoza has normal reflexes. No cranial nerve deficit. Yolanda Espinoza exhibits normal muscle tone. Coordination normal.  Skin: Skin is warm and dry. No rash noted. No erythema. No pallor.  -Bilateral axilla pea size superficial probable blocked pores. No erythema/ exudate/ tenderness  -  Right groin- 1 cm knot with tenderness and mild ecchymosis/ probable hematoma from recent procedure  Psychiatric: Yolanda Espinoza has a normal mood and affect. Yolanda Espinoza behavior is normal. Judgment and thought content normal.    AORTA SCAN WNL EKG NSCSPT     Assessment & Plan:  1. CPE- Update screening labs/ History/ Immunizations/ Testing as needed. Advised healthy diet, QD exercise, increase H20 and continue  RX/ Vitamins AD.  2. F/U for HTN, Cholesterol, Pre-Dm, D. Deficient. Needs healthy diet, cardio QD and obtain healthy weight. Check Labs, Check BP if >130/80 call office  3. Cough vs Allergic rhinitis vs reflux vs recent procedure- Claritin or Zyrtec OTC, increase H2o, allergy hygiene explained. Advised for signs of reflux to monitor for/ bland diet. W/c if symptom increase especially with recent procedure need close monitoring.   4. Cerebral aneurysm with "pipeline"- recent angiogram evaluation, keep f/u and monitoring AD especially with d/c of plavix at end of month with maintenance with 325 mg ASA.  5. ? Hidradenitis suppurativa- advised warm moist compresses/ no Deoderant x 1 week, trial gold bond powder, call with results or increased symptoms  6.? Hematoma at procedure site- advised warm moist compresses and rest x 1 day if any increased symptoms f/u ASAP

## 2013-08-26 DIAGNOSIS — N390 Urinary tract infection, site not specified: Secondary | ICD-10-CM

## 2013-08-26 LAB — URINALYSIS, MICROSCOPIC ONLY
BACTERIA UA: NONE SEEN
Casts: NONE SEEN
Crystals: NONE SEEN

## 2013-08-26 LAB — URINALYSIS, ROUTINE W REFLEX MICROSCOPIC
BILIRUBIN URINE: NEGATIVE
Glucose, UA: NEGATIVE mg/dL
HGB URINE DIPSTICK: NEGATIVE
KETONES UR: NEGATIVE mg/dL
NITRITE: NEGATIVE
PH: 5.5 (ref 5.0–8.0)
Protein, ur: 30 mg/dL — AB
SPECIFIC GRAVITY, URINE: 1.017 (ref 1.005–1.030)
UROBILINOGEN UA: 1 mg/dL (ref 0.0–1.0)

## 2013-08-26 LAB — HEPATIC FUNCTION PANEL
ALBUMIN: 4.5 g/dL (ref 3.5–5.2)
ALK PHOS: 59 U/L (ref 39–117)
ALT: 9 U/L (ref 0–35)
AST: 17 U/L (ref 0–37)
Bilirubin, Direct: 0.1 mg/dL (ref 0.0–0.3)
Indirect Bilirubin: 0.2 mg/dL (ref 0.2–1.2)
TOTAL PROTEIN: 7.6 g/dL (ref 6.0–8.3)
Total Bilirubin: 0.3 mg/dL (ref 0.2–1.2)

## 2013-08-26 LAB — MICROALBUMIN / CREATININE URINE RATIO
CREATININE, URINE: 218.7 mg/dL
MICROALB UR: 14.93 mg/dL — AB (ref 0.00–1.89)
Microalb Creat Ratio: 68.3 mg/g — ABNORMAL HIGH (ref 0.0–30.0)

## 2013-08-26 LAB — BASIC METABOLIC PANEL WITH GFR
BUN: 14 mg/dL (ref 6–23)
CO2: 26 meq/L (ref 19–32)
CREATININE: 1.01 mg/dL (ref 0.50–1.10)
Calcium: 9.1 mg/dL (ref 8.4–10.5)
Chloride: 100 mEq/L (ref 96–112)
GFR, EST AFRICAN AMERICAN: 71 mL/min
GFR, Est Non African American: 62 mL/min
GLUCOSE: 86 mg/dL (ref 70–99)
Potassium: 3.6 mEq/L (ref 3.5–5.3)
Sodium: 136 mEq/L (ref 135–145)

## 2013-08-26 LAB — IRON AND TIBC
%SAT: 9 % — ABNORMAL LOW (ref 20–55)
IRON: 32 ug/dL — AB (ref 42–145)
TIBC: 357 ug/dL (ref 250–470)
UIBC: 325 ug/dL (ref 125–400)

## 2013-08-26 LAB — VITAMIN D 25 HYDROXY (VIT D DEFICIENCY, FRACTURES): VIT D 25 HYDROXY: 43 ng/mL (ref 30–89)

## 2013-08-26 LAB — LIPID PANEL
CHOLESTEROL: 173 mg/dL (ref 0–200)
HDL: 95 mg/dL (ref >39)
LDL Cholesterol: 56 mg/dL (ref 0–99)
Total CHOL/HDL Ratio: 1.8 Ratio
Triglycerides: 110 mg/dL (ref <150)
VLDL: 22 mg/dL (ref 0–40)

## 2013-08-26 LAB — HEMOGLOBIN A1C
Hgb A1c MFr Bld: 5.7 % — ABNORMAL HIGH (ref <5.7)
MEAN PLASMA GLUCOSE: 117 mg/dL — AB (ref <117)

## 2013-08-26 LAB — MAGNESIUM: MAGNESIUM: 1.4 mg/dL — AB (ref 1.5–2.5)

## 2013-08-26 LAB — INSULIN, FASTING: INSULIN FASTING, SERUM: 13 u[IU]/mL (ref 3–28)

## 2013-08-26 LAB — VITAMIN B12: Vitamin B-12: >2000 pg/mL — ABNORMAL HIGH (ref 211–911)

## 2013-08-26 LAB — FOLATE RBC: RBC Folate: 1043 ng/mL (ref >280)

## 2013-08-26 LAB — TSH: TSH: 3.197 u[IU]/mL (ref 0.350–4.500)

## 2013-08-27 LAB — URINE CULTURE

## 2013-08-29 ENCOUNTER — Other Ambulatory Visit: Payer: Self-pay | Admitting: *Deleted

## 2013-08-29 MED ORDER — CIPROFLOXACIN HCL 500 MG PO TABS: 500.0000 mg | ORAL_TABLET | Freq: Two times a day (BID) | ORAL | Status: AC

## 2013-08-29 NOTE — Progress Notes (Signed)
Abx sent into pharmacy per Kelby Aline, PA-C orders, with questionable UTI vs not a clean catch.  Patient states mild dysuria symptoms and increasing ower abd pressure.  Fwd message to front staff to call and schedule 1 month lab recheck UA,C&S.

## 2013-09-08 ENCOUNTER — Other Ambulatory Visit: Payer: Self-pay | Admitting: Emergency Medicine

## 2013-09-12 ENCOUNTER — Other Ambulatory Visit: Payer: Self-pay | Admitting: Radiology

## 2013-09-12 ENCOUNTER — Ambulatory Visit (HOSPITAL_COMMUNITY)
Admission: AD | Admit: 2013-09-12 | Discharge: 2013-09-12 | Disposition: A | Discharge status: Home / Self Care | Payer: BC Managed Care – PPO | Source: Ambulatory Visit | Attending: Interventional Radiology | Admitting: Interventional Radiology

## 2013-09-12 DIAGNOSIS — Z48812 Encounter for surgical aftercare following surgery on the circulatory system: Secondary | ICD-10-CM | POA: Diagnosis present

## 2013-09-12 LAB — PLATELET INHIBITION P2Y12: Platelet Function  P2Y12: 211 [PRU] (ref 194–418)

## 2013-09-23 ENCOUNTER — Telehealth (HOSPITAL_COMMUNITY): Payer: Self-pay | Admitting: Interventional Radiology

## 2013-09-23 NOTE — Telephone Encounter (Signed)
Called pt to verify that she is only taking the Aspirin 325mg  1 qd and has discontinued her Plavix. She states that she has in fact stopped Plavix and only takes Aspirin 32mg  1 qd and is doing well. JM

## 2013-10-01 ENCOUNTER — Other Ambulatory Visit (INDEPENDENT_AMBULATORY_CARE_PROVIDER_SITE_OTHER): Payer: BC Managed Care – PPO

## 2013-10-01 DIAGNOSIS — N390 Urinary tract infection, site not specified: Secondary | ICD-10-CM

## 2013-10-01 DIAGNOSIS — D649 Anemia, unspecified: Secondary | ICD-10-CM

## 2013-10-01 DIAGNOSIS — R6889 Other general symptoms and signs: Secondary | ICD-10-CM

## 2013-10-01 LAB — CBC WITH DIFFERENTIAL/PLATELET
BASOS PCT: 1 % (ref 0–1)
Basophils Absolute: 0.1 10*3/uL (ref 0.0–0.1)
EOS ABS: 0.2 10*3/uL (ref 0.0–0.7)
Eosinophils Relative: 4 % (ref 0–5)
HCT: 28.5 % — ABNORMAL LOW (ref 36.0–46.0)
Hemoglobin: 9.3 g/dL — ABNORMAL LOW (ref 12.0–15.0)
Lymphocytes Relative: 43 % (ref 12–46)
Lymphs Abs: 2.5 10*3/uL (ref 0.7–4.0)
MCH: 27 pg (ref 26.0–34.0)
MCHC: 32.6 g/dL (ref 30.0–36.0)
MCV: 82.8 fL (ref 78.0–100.0)
Monocytes Absolute: 0.4 10*3/uL (ref 0.1–1.0)
Monocytes Relative: 7 % (ref 3–12)
NEUTROS PCT: 45 % (ref 43–77)
Neutro Abs: 2.6 10*3/uL (ref 1.7–7.7)
Platelets: 279 10*3/uL (ref 150–400)
RBC: 3.44 MIL/uL — ABNORMAL LOW (ref 3.87–5.11)
RDW: 15.4 % (ref 11.5–15.5)
WBC: 5.8 10*3/uL (ref 4.0–10.5)

## 2013-10-01 LAB — IRON AND TIBC
%SAT: 12 % — ABNORMAL LOW (ref 20–55)
IRON: 39 ug/dL — AB (ref 42–145)
TIBC: 328 ug/dL (ref 250–470)
UIBC: 289 ug/dL (ref 125–400)

## 2013-10-01 LAB — FERRITIN: FERRITIN: 62 ng/mL (ref 10–291)

## 2013-10-02 LAB — URINALYSIS, ROUTINE W REFLEX MICROSCOPIC
Bilirubin Urine: NEGATIVE
Glucose, UA: NEGATIVE mg/dL
HGB URINE DIPSTICK: NEGATIVE
KETONES UR: NEGATIVE mg/dL
Leukocytes, UA: NEGATIVE
NITRITE: NEGATIVE
PH: 5.5 (ref 5.0–8.0)
PROTEIN: NEGATIVE mg/dL
Specific Gravity, Urine: 1.012 (ref 1.005–1.030)
Urobilinogen, UA: 0.2 mg/dL (ref 0.0–1.0)

## 2013-10-02 LAB — URINE CULTURE
Colony Count: NO GROWTH
Organism ID, Bacteria: NO GROWTH

## 2013-10-02 LAB — MICROALBUMIN / CREATININE URINE RATIO
Creatinine, Urine: 108.8 mg/dL
MICROALB/CREAT RATIO: 73.8 mg/g — AB (ref 0.0–30.0)
Microalb, Ur: 8.03 mg/dL — ABNORMAL HIGH (ref 0.00–1.89)

## 2013-10-11 ENCOUNTER — Other Ambulatory Visit: Payer: Self-pay | Admitting: Physician Assistant

## 2013-10-30 ENCOUNTER — Telehealth (HOSPITAL_COMMUNITY): Payer: Self-pay | Admitting: Interventional Radiology

## 2013-10-30 ENCOUNTER — Other Ambulatory Visit: Payer: Self-pay | Admitting: *Deleted

## 2013-10-30 MED ORDER — FUROSEMIDE 40 MG PO TABS: ORAL_TABLET | ORAL | Status: DC

## 2013-10-30 NOTE — Telephone Encounter (Signed)
Pt called and needed a refill of her prescription for Protonix 40mg  1 qd. I called in Protonix 40mg  1 qd x60 tablets with 2 refills to [facility] on [address] [id number]. JM

## 2013-10-31 ENCOUNTER — Other Ambulatory Visit: Payer: Self-pay | Admitting: Physician Assistant

## 2013-11-08 ENCOUNTER — Other Ambulatory Visit: Payer: Self-pay | Admitting: Internal Medicine

## 2013-11-11 ENCOUNTER — Encounter: Payer: Self-pay | Admitting: Internal Medicine

## 2013-11-11 ENCOUNTER — Ambulatory Visit (INDEPENDENT_AMBULATORY_CARE_PROVIDER_SITE_OTHER): Payer: BC Managed Care – PPO | Admitting: Internal Medicine

## 2013-11-11 VITALS — BP 120/72 | HR 72 | Temp 97.3°F | Resp 16 | Ht 70.0 in | Wt 225.0 lb

## 2013-11-11 DIAGNOSIS — E559 Vitamin D deficiency, unspecified: Secondary | ICD-10-CM

## 2013-11-11 DIAGNOSIS — Z23 Encounter for immunization: Secondary | ICD-10-CM

## 2013-11-11 DIAGNOSIS — Z79899 Other long term (current) drug therapy: Secondary | ICD-10-CM | POA: Insufficient documentation

## 2013-11-11 DIAGNOSIS — R7309 Other abnormal glucose: Secondary | ICD-10-CM | POA: Insufficient documentation

## 2013-11-11 DIAGNOSIS — R7303 Prediabetes: Secondary | ICD-10-CM

## 2013-11-11 DIAGNOSIS — I10 Essential (primary) hypertension: Secondary | ICD-10-CM

## 2013-11-11 DIAGNOSIS — E785 Hyperlipidemia, unspecified: Secondary | ICD-10-CM

## 2013-11-11 LAB — CBC WITH DIFFERENTIAL/PLATELET
Basophils Absolute: 0.1 10*3/uL (ref 0.0–0.1)
Basophils Relative: 1 % (ref 0–1)
EOS PCT: 3 % (ref 0–5)
Eosinophils Absolute: 0.2 10*3/uL (ref 0.0–0.7)
HEMATOCRIT: 33.9 % — AB (ref 36.0–46.0)
Hemoglobin: 10.9 g/dL — ABNORMAL LOW (ref 12.0–15.0)
Lymphocytes Relative: 35 % (ref 12–46)
Lymphs Abs: 2.6 10*3/uL (ref 0.7–4.0)
MCH: 26.2 pg (ref 26.0–34.0)
MCHC: 32.2 g/dL (ref 30.0–36.0)
MCV: 81.5 fL (ref 78.0–100.0)
MONO ABS: 0.4 10*3/uL (ref 0.1–1.0)
Monocytes Relative: 5 % (ref 3–12)
Neutro Abs: 4.2 10*3/uL (ref 1.7–7.7)
Neutrophils Relative %: 56 % (ref 43–77)
Platelets: 314 10*3/uL (ref 150–400)
RBC: 4.16 MIL/uL (ref 3.87–5.11)
RDW: 15.3 % (ref 11.5–15.5)
WBC: 7.5 10*3/uL (ref 4.0–10.5)

## 2013-11-11 LAB — HEMOGLOBIN A1C
Hgb A1c MFr Bld: 5.6 % (ref <5.7)
MEAN PLASMA GLUCOSE: 114 mg/dL (ref <117)

## 2013-11-11 NOTE — Progress Notes (Signed)
Patient ID: Yolanda Espinoza, female   DOB: 19-Sep-1955, 58 y.o.   MRN: [id number]   This very nice 58 y.o.female presents for 3 month follow up with Hypertension, Hyperlipidemia, Pre-Diabetes and Vitamin D Deficiency.    Patient is treated for HTN predating since age 25 in 23 & BP has been controlled at home. Today's BP: 120/72 mmHg. Patient has had no complaints of any cardiac type chest pain, palpitations, dyspnea/orthopnea/PND, dizziness, claudication, or dependent edema. She has seen Dr Cathie Olden for a Heart Murmer - Mod MR by 2D Echo and she had a Negative Cardiolite in Nov 2014.   Hyperlipidemia is controlled with diet & meds. Patient denies myalgias or other med SE's. Last Lipids were at goal - Total  Chol 173; HDL  95; LDL  56; [id number] on  08/25/2013.   Also, the patient has history of Morbid Obesity (BMI 32+)  And consequent PreDiabetes (A1c 5.7% in Nov 2012) and she's has had no symptoms of reactive hypoglycemia, diabetic polys, paresthesias or visual blurring.  Last A1c was  5.7% on 08/25/2013.    Further, the patient also has history of Vitamin D Deficiency (13 in 2009)  and supplements vitamin D without any suspected side-effects. Last vitamin D was 43 on  08/25/2013.   Medication List   amLODipine-valsartan 10-320 MG per tablet  Commonly known as:  EXFORGE  Take 1 tablet by mouth daily.     aspirin 325 MG tablet  Take 325 mg by mouth daily.     Fish Oil 1200 MG Caps  Take 2,400 mg by mouth daily.     furosemide 40 MG tablet  Commonly known as:  LASIX  TAKE ONE TABLET BY MOUTH ONCE DAILY     metoprolol succinate 25 MG 24 hr tablet  Commonly known as:  TOPROL-XL  TAKE ONE TABLET BY MOUTH ONCE DAILY     multivitamin with minerals tablet  Take 1 tablet by mouth daily.     pantoprazole 40 MG tablet  Commonly known as:  PROTONIX  Take 1 tablet (40 mg total) by mouth daily.     potassium chloride 10 MEQ tablet  Commonly known as:  K-DUR  TAKE ONE TABLET BY MOUTH ONCE DAILY     PREMARIN 0.9 MG tablet  Generic drug:  estrogens (conjugated)  TAKE ONE TABLET BY MOUTH DAILY     vitamin B-12 1000 MCG tablet  Commonly known as:  CYANOCOBALAMIN  Take 1,000 mcg by mouth daily.     Allergies  Allergen Reactions  . Aspirin Nausea And Vomiting  . Ace Inhibitors Nausea And Vomiting   PMHx:   Past Medical History  Diagnosis Date  . Subarachnoid hemorrhage due to ruptured aneurysm     2003 - s/p coiling  . Hypertension may, 1973  . Mitral regurgitation     a. Echo 01/2012 mild LVH, EF 55-65%, Gr 2 DD, mod MR, mild LAE, PASP 36;  b. Echo (02/2013):  EF 55-60%, Gr 1 DD, mild to mod MR, mild LAE (no sig change since prior echo)  . Chronic diastolic heart failure   . Hyperlipidemia   . Unspecified vitamin D deficiency   . Heart murmur   . Stroke 07    no weakness  . Peripheral vascular disease     cerebral aneursym  . OSA on CPAP     7 yrs  . GERD (gastroesophageal reflux disease)   . Arthritis    Immunization History  Administered Date(s) Administered  . Influenza Split  11/11/2013  . Influenza-Unspecified 11/25/2012  . Tdap 08/25/2013   Past Surgical History  Procedure Laterality Date  . Aneurysm coiling    . Head surgery      aneurysm ruptured- stroke  . Abdominal hysterectomy    . Tonsillectomy and adenoidectomy    . Radiology with anesthesia N/A 04/23/2013    Procedure: RADIOLOGY WITH ANESTHESIA;  Surgeon: Rob Hickman, MD;  Location: [facility];  Service: Radiology;  Laterality: N/A;     FHx:    Reviewed / unchanged  SHx:    Reviewed / unchanged   Systems Review:  Constitutional: Denies fever, chills, wt changes, headaches, insomnia, fatigue, night sweats, change in appetite. Eyes: Denies redness, blurred vision, diplopia, discharge, itchy, watery eyes.  ENT: Denies discharge, congestion, post nasal drip, epistaxis, sore throat, earache, hearing loss, dental pain, tinnitus, vertigo, sinus pain, snoring.  CV: Denies chest pain, palpitations,  irregular heartbeat, syncope, dyspnea, diaphoresis, orthopnea, PND, claudication or edema. Respiratory: denies cough, dyspnea, DOE, pleurisy, hoarseness, laryngitis, wheezing.  Gastrointestinal: Denies dysphagia, odynophagia, heartburn, reflux, water brash, abdominal pain or cramps, nausea, vomiting, bloating, diarrhea, constipation, hematemesis, melena, hematochezia  or hemorrhoids. Genitourinary: Denies dysuria, frequency, urgency, nocturia, hesitancy, discharge, hematuria or flank pain. Musculoskeletal: Denies arthralgias, myalgias, stiffness, jt. swelling, pain, limping or strain/sprain.  Skin: Denies pruritus, rash, hives, warts, acne, eczema or change in skin lesion(s). Neuro: No weakness, tremor, incoordination, spasms, paresthesia or pain. Psychiatric: Denies confusion, memory loss or sensory loss. Endo: Denies change in weight, skin or hair change.  Heme/Lymph: No excessive bleeding, bruising or enlarged lymph nodes.  Exam:  BP 120/72  Pulse 72  Temp(Src) 97.3 F (36.3 C) (Temporal)  Resp 16  Ht 5\' 10"  (1.778 m)  Wt 225 lb (102.059 kg)  BMI 32.28 kg/m2  Appears well nourished and in no distress. Eyes: PERRLA, EOMs, conjunctiva no swelling or erythema. Sinuses: No frontal/maxillary tenderness ENT/Mouth: EAC's clear, TM's nl w/o erythema, bulging. Nares clear w/o erythema, swelling, exudates. Oropharynx clear without erythema or exudates. Oral hygiene is good. Tongue normal, non obstructing. Hearing intact.  Neck: Supple. Thyroid nl. Car 2+/2+ without bruits, nodes or JVD. Chest: Respirations nl with BS clear & equal w/o rales, rhonchi, wheezing or stridor.  Cor: Heart sounds normal w/ regular rate and rhythm without sig. murmurs, gallops, clicks, or rubs. Peripheral pulses normal and equal  without edema.  Abdomen: Soft & bowel sounds normal. Non-tender w/o guarding, rebound, hernias, masses, or organomegaly.  Lymphatics: Unremarkable.  Musculoskeletal: Full ROM all peripheral  extremities, joint stability, 5/5 strength, and normal gait.  Skin: Warm, dry without exposed rashes, lesions or ecchymosis apparent.  Neuro: Cranial nerves intact, reflexes equal bilaterally. Sensory-motor testing grossly intact. Tendon reflexes grossly intact.  Pysch: Alert & oriented x 3.  Insight and judgement nl & appropriate. No ideations.  Assessment and Plan:  1. Hypertension - Continue monitor blood pressure at home. Continue diet/meds same.  2. Hyperlipidemia - Continue diet/meds, exercise,& lifestyle modifications. Continue monitor periodic cholesterol/liver & renal functions   3. Pre-Diabetes - Continue diet, exercise, lifestyle modifications. Monitor appropriate labs.  4. Vitamin D Deficiency - Continue supplementation.  5. Morbid Obesity (BMI 32+) -    Recommended regular exercise, BP monitoring, weight control, and discussed med and SE's. Recommended labs to assess and monitor clinical status. Further disposition pending results of labs.

## 2013-11-11 NOTE — Patient Instructions (Signed)

## 2013-11-12 LAB — TSH: TSH: 1.585 u[IU]/mL (ref 0.350–4.500)

## 2013-11-12 LAB — BASIC METABOLIC PANEL WITH GFR
BUN: 20 mg/dL (ref 6–23)
CALCIUM: 9.8 mg/dL (ref 8.4–10.5)
CO2: 27 mEq/L (ref 19–32)
CREATININE: 0.95 mg/dL (ref 0.50–1.10)
Chloride: 100 mEq/L (ref 96–112)
GFR, EST NON AFRICAN AMERICAN: 66 mL/min
GFR, Est African American: 76 mL/min
Glucose, Bld: 79 mg/dL (ref 70–99)
Potassium: 4 mEq/L (ref 3.5–5.3)
Sodium: 137 mEq/L (ref 135–145)

## 2013-11-12 LAB — LIPID PANEL
Cholesterol: 194 mg/dL (ref 0–200)
HDL: 104 mg/dL (ref >39)
LDL Cholesterol: 68 mg/dL (ref 0–99)
TRIGLYCERIDES: 109 mg/dL (ref <150)
Total CHOL/HDL Ratio: 1.9 Ratio
VLDL: 22 mg/dL (ref 0–40)

## 2013-11-12 LAB — HEPATIC FUNCTION PANEL
ALBUMIN: 4.6 g/dL (ref 3.5–5.2)
ALT: 11 U/L (ref 0–35)
AST: 20 U/L (ref 0–37)
Alkaline Phosphatase: 59 U/L (ref 39–117)
Bilirubin, Direct: 0.1 mg/dL (ref 0.0–0.3)
Indirect Bilirubin: 0.2 mg/dL (ref 0.2–1.2)
Total Bilirubin: 0.3 mg/dL (ref 0.2–1.2)
Total Protein: 7.8 g/dL (ref 6.0–8.3)

## 2013-11-12 LAB — MAGNESIUM: Magnesium: 1.6 mg/dL (ref 1.5–2.5)

## 2013-11-12 LAB — INSULIN, FASTING: INSULIN FASTING, SERUM: 5.1 u[IU]/mL (ref 2.0–19.6)

## 2013-11-12 LAB — VITAMIN D 25 HYDROXY (VIT D DEFICIENCY, FRACTURES): VIT D 25 HYDROXY: 49 ng/mL (ref 30–89)

## 2014-01-15 ENCOUNTER — Other Ambulatory Visit: Payer: Self-pay | Admitting: Emergency Medicine

## 2014-01-15 DIAGNOSIS — Z1231 Encounter for screening mammogram for malignant neoplasm of breast: Secondary | ICD-10-CM

## 2014-01-23 ENCOUNTER — Other Ambulatory Visit: Payer: Self-pay | Admitting: Cardiovascular Disease

## 2014-02-16 ENCOUNTER — Other Ambulatory Visit: Payer: Self-pay | Admitting: Internal Medicine

## 2014-02-18 ENCOUNTER — Ambulatory Visit (HOSPITAL_COMMUNITY)
Admission: RE | Admit: 2014-02-18 | Discharge: 2014-02-18 | Disposition: A | Discharge status: Home / Self Care | Payer: BLUE CROSS/BLUE SHIELD | Source: Ambulatory Visit | Attending: Emergency Medicine | Admitting: Emergency Medicine

## 2014-02-18 DIAGNOSIS — Z1231 Encounter for screening mammogram for malignant neoplasm of breast: Secondary | ICD-10-CM | POA: Insufficient documentation

## 2014-03-11 ENCOUNTER — Ambulatory Visit: Payer: Self-pay | Admitting: Physician Assistant

## 2014-03-11 ENCOUNTER — Encounter: Payer: Self-pay | Admitting: Cardiovascular Disease

## 2014-03-11 ENCOUNTER — Ambulatory Visit (INDEPENDENT_AMBULATORY_CARE_PROVIDER_SITE_OTHER): Payer: BLUE CROSS/BLUE SHIELD | Admitting: Cardiovascular Disease

## 2014-03-11 VITALS — BP 118/80 | HR 65 | Ht 70.0 in | Wt 223.8 lb

## 2014-03-11 DIAGNOSIS — I10 Essential (primary) hypertension: Secondary | ICD-10-CM

## 2014-03-11 DIAGNOSIS — R011 Cardiac murmur, unspecified: Secondary | ICD-10-CM

## 2014-03-11 NOTE — Progress Notes (Signed)
Cardiology Office Note   Date:  03/11/2014   ID:  Yolanda Espinoza, DOB 19-Sep-1955, MRN [id number]  PCP:  Vicie Mutters, PA-C  Cardiologist:   Acie Fredrickson Wonda Cheng, MD   Chief Complaint  Patient presents with  . Hypertension   1. hypertension 2. Heart murmur 3. Cerebral aneurysm  History of Present Illness:  Yolanda Espinoza is a 59 year old female with a history of hypertension and a cerebral aneurysm. She was to have a heart murmur and was referred here for further evaluation. She also has been having some shortness of breath. She exercises for 30 minutes every day typically does not have much shortness breath when she is exercising. She seems to have shortness of breath at other times. She denies any syncope or presyncope. She denies any chest pain.  She works at [employer]. She does also helps the seniors with activities.  Hx of blood pressure on occasion. Her blood pressure readings have been normal.   Feb. 17, 2016:   Yolanda Espinoza is a 59 y.o. female who presents for follow up of her HTN She is dong well.  She has had the cerebral coils removed and had another procedure. Headaches have resolved. BP is well controlled.  Getting some exercise.  Still working at [employer].   Past Medical History  Diagnosis Date  . Subarachnoid hemorrhage due to ruptured aneurysm     2003 - s/p coiling  . Hypertension may, 1973  . Mitral regurgitation     a. Echo 01/2012 mild LVH, EF 55-65%, Gr 2 DD, mod MR, mild LAE, PASP 36;  b. Echo (02/2013):  EF 55-60%, Gr 1 DD, mild to mod MR, mild LAE (no sig change since prior echo)  . Chronic diastolic heart failure   . Hyperlipidemia   . Unspecified vitamin D deficiency   . Heart murmur   . Stroke 07    no weakness  . Peripheral vascular disease     cerebral aneursym  . OSA on CPAP     7 yrs  . GERD (gastroesophageal reflux disease)   . Arthritis     Past Surgical History  Procedure Laterality Date  . Aneurysm  coiling    . Head surgery      aneurysm ruptured- stroke  . Abdominal hysterectomy    . Tonsillectomy and adenoidectomy    . Radiology with anesthesia N/A 04/23/2013    Procedure: RADIOLOGY WITH ANESTHESIA;  Surgeon: Rob Hickman, MD;  Location: [facility];  Service: Radiology;  Laterality: N/A;     Current Outpatient Prescriptions  Medication Sig Dispense Refill  . amLODipine-valsartan (EXFORGE) 10-320 MG per tablet TAKE ONE TABLET BY MOUTH ONCE DAILY 90 tablet 0  . aspirin 325 MG tablet Take 325 mg by mouth daily.    . metoprolol succinate (TOPROL-XL) 25 MG 24 hr tablet TAKE ONE TABLET BY MOUTH ONCE DAILY 30 tablet 11  . Multiple Vitamins-Minerals (MULTIVITAMIN WITH MINERALS) tablet Take 1 tablet by mouth daily.    . Omega-3 Fatty Acids (FISH OIL) 1200 MG CAPS Take 2,400 mg by mouth daily.     . pantoprazole (PROTONIX) 40 MG tablet Take 1 tablet (40 mg total) by mouth daily.    . potassium chloride (K-DUR) 10 MEQ tablet TAKE ONE TABLET BY MOUTH ONCE DAILY 90 tablet 1  . PREMARIN 0.9 MG tablet TAKE ONE TABLET BY MOUTH DAILY 30 tablet 5  . vitamin B-12 (CYANOCOBALAMIN) 1000 MCG tablet Take 1,000 mcg by mouth daily.    Marland Kitchen  furosemide (LASIX) 40 MG tablet TAKE ONE TABLET BY MOUTH ONCE DAILY 90 tablet 0   No current facility-administered medications for this visit.    Allergies:   Aspirin and Ace inhibitors    Social History:  The patient  reports that she quit smoking about 15 years ago. Her smoking use included Cigarettes. She has a .75 pack-year smoking history. She does not have any smokeless tobacco history on file. She reports that she does not drink alcohol or use illicit drugs.   Family History:  The patient's family history includes Hypertension in her father, sister, sister, and sister; Kidney disease in her father.    ROS:  Please see the history of present illness.    Review of Systems: Constitutional:  denies fever, chills, diaphoresis, appetite change and fatigue.    HEENT: denies photophobia, eye pain, redness, hearing loss, ear pain, congestion, sore throat, rhinorrhea, sneezing, neck pain, neck stiffness and tinnitus.  Respiratory: denies SOB, DOE, cough, chest tightness, and wheezing.  Cardiovascular: denies chest pain, palpitations and leg swelling.  Gastrointestinal: denies nausea, vomiting, abdominal pain, diarrhea, constipation, blood in stool.  Genitourinary: denies dysuria, urgency, frequency, hematuria, flank pain and difficulty urinating.  Musculoskeletal: denies  myalgias, back pain, joint swelling, arthralgias and gait problem.   Skin: denies pallor, rash and wound.  Neurological: denies dizziness, seizures, syncope, weakness, light-headedness, numbness and headaches.   Hematological: denies adenopathy, easy bruising, personal or family bleeding history.  Psychiatric/ Behavioral: denies suicidal ideation, mood changes, confusion, nervousness, sleep disturbance and agitation.       All other systems are reviewed and negative.    PHYSICAL EXAM: VS:  BP 118/80 mmHg  Pulse 65  Ht 5\' 10"  (1.778 m)  Wt 223 lb 12.8 oz (101.515 kg)  BMI 32.11 kg/m2 , BMI Body mass index is 32.11 kg/(m^2). GEN: Well nourished, well developed, in no acute distress HEENT: normal Neck: no JVD, carotid bruits, or masses Cardiac: RRR; 2/6 systolic murmur, rubs, or gallops,no edema  Respiratory:  clear to auscultation bilaterally, normal work of breathing GI: soft, nontender, nondistended, + BS MS: no deformity or atrophy Skin: warm and dry, no rash Neuro:  Strength and sensation are intact Psych: normal   EKG:  EKG is not ordered today.    Recent Labs: 11/11/2013: ALT 11; BUN 20; Creatinine 0.95; Hemoglobin 10.9*; Magnesium 1.6; Platelets 314; Potassium 4.0; Sodium 137; TSH 1.585    Lipid Panel    Component Value Date/Time   CHOL 194 11/11/2013 1233   TRIG 109 11/11/2013 1233   HDL 104 11/11/2013 1233   CHOLHDL 1.9 11/11/2013 1233   VLDL 22  11/11/2013 1233   LDLCALC 68 11/11/2013 1233      Wt Readings from Last 3 Encounters:  03/11/14 223 lb 12.8 oz (101.515 kg)  11/11/13 225 lb (102.059 kg)  08/25/13 225 lb 6.4 oz (102.241 kg)      Other studies Reviewed: Additional studies/ records that were reviewed today include: . Review of the above records demonstrates:    ASSESSMENT AND PLAN:  1. Hypertension - BP is well controlled .  Continue current meds.  2. Heart murmur- stable   3. Cerebral aneurysm- s/p repair    Current medicines are reviewed at length with the patient today.  The patient does not have concerns regarding medicines.  The following changes have been made:  no change   Disposition:   FU with me in 1 year     Signed, Nahser, Wonda Cheng, MD  03/11/2014  10:36 AM    [facility] [address], [place], [place]  [address] Phone: [phone]; Fax: [phone]

## 2014-03-11 NOTE — Patient Instructions (Signed)
Your physician recommends that you continue on your current medications as directed. Please refer to the Current Medication list given to you today.  Your physician wants you to follow-up in: 1 year with Dr. Nahser.  You will receive a reminder letter in the mail two months in advance. If you don't receive a letter, please call our office to schedule the follow-up appointment.  

## 2014-03-26 ENCOUNTER — Telehealth (HOSPITAL_COMMUNITY): Payer: Self-pay | Admitting: Interventional Radiology

## 2014-03-26 ENCOUNTER — Other Ambulatory Visit (HOSPITAL_COMMUNITY): Payer: Self-pay | Admitting: Interventional Radiology

## 2014-03-26 DIAGNOSIS — I671 Cerebral aneurysm, nonruptured: Secondary | ICD-10-CM

## 2014-03-26 NOTE — Telephone Encounter (Signed)
Called pt to scheduled 6 month f/u MRI/MRA, no answer, no VM. JM

## 2014-04-01 ENCOUNTER — Ambulatory Visit (INDEPENDENT_AMBULATORY_CARE_PROVIDER_SITE_OTHER): Payer: BLUE CROSS/BLUE SHIELD | Admitting: Physician Assistant

## 2014-04-01 ENCOUNTER — Encounter: Payer: Self-pay | Admitting: Physician Assistant

## 2014-04-01 VITALS — BP 130/82 | HR 60 | Temp 97.7°F | Resp 16 | Ht 70.0 in | Wt 221.0 lb

## 2014-04-01 DIAGNOSIS — R7303 Prediabetes: Secondary | ICD-10-CM

## 2014-04-01 DIAGNOSIS — Z79899 Other long term (current) drug therapy: Secondary | ICD-10-CM

## 2014-04-01 DIAGNOSIS — E669 Obesity, unspecified: Secondary | ICD-10-CM

## 2014-04-01 DIAGNOSIS — G4733 Obstructive sleep apnea (adult) (pediatric): Secondary | ICD-10-CM

## 2014-04-01 DIAGNOSIS — E559 Vitamin D deficiency, unspecified: Secondary | ICD-10-CM

## 2014-04-01 DIAGNOSIS — I10 Essential (primary) hypertension: Secondary | ICD-10-CM

## 2014-04-01 DIAGNOSIS — Z9989 Dependence on other enabling machines and devices: Secondary | ICD-10-CM

## 2014-04-01 DIAGNOSIS — E785 Hyperlipidemia, unspecified: Secondary | ICD-10-CM

## 2014-04-01 DIAGNOSIS — R7309 Other abnormal glucose: Secondary | ICD-10-CM

## 2014-04-01 LAB — CBC WITH DIFFERENTIAL/PLATELET
BASOS ABS: 0.1 10*3/uL (ref 0.0–0.1)
Basophils Relative: 1 % (ref 0–1)
EOS ABS: 0.2 10*3/uL (ref 0.0–0.7)
EOS PCT: 4 % (ref 0–5)
HCT: 34.1 % — ABNORMAL LOW (ref 36.0–46.0)
Hemoglobin: 11.1 g/dL — ABNORMAL LOW (ref 12.0–15.0)
LYMPHS ABS: 2.3 10*3/uL (ref 0.7–4.0)
LYMPHS PCT: 39 % (ref 12–46)
MCH: 27.2 pg (ref 26.0–34.0)
MCHC: 32.6 g/dL (ref 30.0–36.0)
MCV: 83.6 fL (ref 78.0–100.0)
MONO ABS: 0.3 10*3/uL (ref 0.1–1.0)
MPV: 9.2 fL (ref 8.6–12.4)
Monocytes Relative: 5 % (ref 3–12)
NEUTROS ABS: 3 10*3/uL (ref 1.7–7.7)
NEUTROS PCT: 51 % (ref 43–77)
PLATELETS: 308 10*3/uL (ref 150–400)
RBC: 4.08 MIL/uL (ref 3.87–5.11)
RDW: 15.7 % — ABNORMAL HIGH (ref 11.5–15.5)
WBC: 5.8 10*3/uL (ref 4.0–10.5)

## 2014-04-01 LAB — LIPID PANEL
CHOL/HDL RATIO: 1.9 ratio
Cholesterol: 188 mg/dL (ref 0–200)
HDL: 101 mg/dL (ref >=46)
LDL Cholesterol: 58 mg/dL (ref 0–99)
Triglycerides: 145 mg/dL (ref <150)
VLDL: 29 mg/dL (ref 0–40)

## 2014-04-01 LAB — HEPATIC FUNCTION PANEL
ALT: 10 U/L (ref 0–35)
AST: 17 U/L (ref 0–37)
Albumin: 4.3 g/dL (ref 3.5–5.2)
Alkaline Phosphatase: 54 U/L (ref 39–117)
BILIRUBIN DIRECT: 0.1 mg/dL (ref 0.0–0.3)
Indirect Bilirubin: 0.2 mg/dL (ref 0.2–1.2)
TOTAL PROTEIN: 7.8 g/dL (ref 6.0–8.3)
Total Bilirubin: 0.3 mg/dL (ref 0.2–1.2)

## 2014-04-01 LAB — TSH: TSH: 1.812 u[IU]/mL (ref 0.350–4.500)

## 2014-04-01 LAB — BASIC METABOLIC PANEL WITH GFR
BUN: 17 mg/dL (ref 6–23)
CO2: 29 mEq/L (ref 19–32)
Calcium: 9.8 mg/dL (ref 8.4–10.5)
Chloride: 98 mEq/L (ref 96–112)
Creat: 0.98 mg/dL (ref 0.50–1.10)
GFR, EST NON AFRICAN AMERICAN: 64 mL/min
GFR, Est African American: 74 mL/min
GLUCOSE: 79 mg/dL (ref 70–99)
POTASSIUM: 4.5 meq/L (ref 3.5–5.3)
Sodium: 136 mEq/L (ref 135–145)

## 2014-04-01 LAB — MAGNESIUM: Magnesium: 1.6 mg/dL (ref 1.5–2.5)

## 2014-04-01 NOTE — Progress Notes (Signed)
Assessment and Plan:  Hypertension: Continue medication, monitor blood pressure at home. Continue DASH diet.  Reminder to go to the ER if any CP, SOB, nausea, dizziness, severe HA, changes vision/speech, left arm numbness and tingling, and jaw pain. Cholesterol: Continue diet and exercise. Check cholesterol.  Pre-diabetes-Continue diet and exercise. Check A1C Vitamin D Def- check level and continue medications.  Obesity with co morbidities- long discussion about weight loss, diet, and exercise Sleep apnea- continue CPAP, weight loss advised.  Hx Stroke-Control blood pressure, cholesterol, glucose, increase exercise. Continue ASA  Continue diet and meds as discussed. Further disposition pending results of labs.  HPI 59 y.o. female  presents for 3 month follow up  has a past medical history of Subarachnoid hemorrhage due to ruptured aneurysm; Hypertension (may, 1973); Mitral regurgitation; Chronic diastolic heart failure; Hyperlipidemia; Unspecified vitamin D deficiency; Heart murmur; Stroke (07); Peripheral vascular disease; OSA on CPAP; GERD (gastroesophageal reflux disease); and Arthritis.  Her blood pressure has been controlled at home, today their BP is BP: 130/82 mmHg  She does workout. She denies chest pain, shortness of breath, dizziness. She has history of CVA due to aneurysm, PVD, and history of MR, follows with Dr. Cathie Olden. She is on 325 ASA.  She is not on cholesterol medication and denies myalgias. Her cholesterol is at goal. The cholesterol last visit was:   Lab Results  Component Value Date   CHOL 194 11/11/2013   HDL 104 11/11/2013   LDLCALC 68 11/11/2013   TRIG 109 11/11/2013   CHOLHDL 1.9 11/11/2013   She has been working on diet and exercise for prediabetes, and denies paresthesia of the feet, polydipsia, polyuria and visual disturbances. Last A1C in the office was:  Lab Results  Component Value Date   HGBA1C 5.6 11/11/2013  Patient is on Vitamin D supplement.   Lab  Results  Component Value Date   VD25OH 49 11/11/2013  Her husband has had several strokes and has early dementia, she is primary care giver.  BMI is Body mass index is 31.71 kg/(m^2)., she is working on diet and exercise and has lost weight.She has OSA and is on CPAP.  Wt Readings from Last 3 Encounters:  04/01/14 221 lb (100.245 kg)  03/11/14 223 lb 12.8 oz (101.515 kg)  11/11/13 225 lb (102.059 kg)       Current Medications:  Current Outpatient Prescriptions on File Prior to Visit  Medication Sig Dispense Refill  . amLODipine-valsartan (EXFORGE) 10-320 MG per tablet TAKE ONE TABLET BY MOUTH ONCE DAILY 90 tablet 0  . aspirin 325 MG tablet Take 325 mg by mouth daily.    . furosemide (LASIX) 40 MG tablet TAKE ONE TABLET BY MOUTH ONCE DAILY 90 tablet 0  . metoprolol succinate (TOPROL-XL) 25 MG 24 hr tablet TAKE ONE TABLET BY MOUTH ONCE DAILY 30 tablet 11  . Multiple Vitamins-Minerals (MULTIVITAMIN WITH MINERALS) tablet Take 1 tablet by mouth daily.    . Omega-3 Fatty Acids (FISH OIL) 1200 MG CAPS Take 2,400 mg by mouth daily.     . pantoprazole (PROTONIX) 40 MG tablet Take 1 tablet (40 mg total) by mouth daily.    . potassium chloride (K-DUR) 10 MEQ tablet TAKE ONE TABLET BY MOUTH ONCE DAILY 90 tablet 1  . PREMARIN 0.9 MG tablet TAKE ONE TABLET BY MOUTH DAILY 30 tablet 5  . vitamin B-12 (CYANOCOBALAMIN) 1000 MCG tablet Take 1,000 mcg by mouth daily.     No current facility-administered medications on file prior to visit.  Medical History:  Past Medical History  Diagnosis Date  . Subarachnoid hemorrhage due to ruptured aneurysm     2003 - s/p coiling  . Hypertension may, 1973  . Mitral regurgitation     a. Echo 01/2012 mild LVH, EF 55-65%, Gr 2 DD, mod MR, mild LAE, PASP 36;  b. Echo (02/2013):  EF 55-60%, Gr 1 DD, mild to mod MR, mild LAE (no sig change since prior echo)  . Chronic diastolic heart failure   . Hyperlipidemia   . Unspecified vitamin D deficiency   . Heart murmur    . Stroke 07    no weakness  . Peripheral vascular disease     cerebral aneursym  . OSA on CPAP     7 yrs  . GERD (gastroesophageal reflux disease)   . Arthritis    Allergies:  Allergies  Allergen Reactions  . Aspirin Nausea And Vomiting  . Ace Inhibitors Nausea And Vomiting     Review of Systems:  Review of Systems  Constitutional: Negative.   HENT: Negative.   Respiratory: Negative.   Cardiovascular: Negative.   Gastrointestinal: Negative.   Genitourinary: Negative.   Musculoskeletal: Negative.   Skin: Negative.   Neurological: Negative.   Psychiatric/Behavioral: Negative.     Family history- Review and unchanged Social history- Review and unchanged Physical Exam: BP 130/82 mmHg  Pulse 60  Temp(Src) 97.7 F (36.5 C)  Resp 16  Ht 5\' 10"  (1.778 m)  Wt 221 lb (100.245 kg)  BMI 31.71 kg/m2 Wt Readings from Last 3 Encounters:  04/01/14 221 lb (100.245 kg)  03/11/14 223 lb 12.8 oz (101.515 kg)  11/11/13 225 lb (102.059 kg)   General Appearance: Well nourished, in no apparent distress. Eyes: PERRLA, EOMs, conjunctiva no swelling or erythema Sinuses: No Frontal/maxillary tenderness ENT/Mouth: Ext aud canals clear, TMs without erythema, bulging. No erythema, swelling, or exudate on post pharynx.  Tonsils not swollen or erythematous. Hearing normal.  Neck: Supple, thyroid normal.  Respiratory: Respiratory effort normal, BS equal bilaterally without rales, rhonchi, wheezing or stridor.  Cardio: RRR with no MRGs. Brisk peripheral pulses without edema.  Abdomen: Soft, + BS,  Non tender, no guarding, rebound, hernias, masses. Lymphatics: Non tender without lymphadenopathy.  Musculoskeletal: Full ROM, 5/5 strength, Normal gait Skin: Warm, dry without rashes, lesions, ecchymosis.  Neuro: Cranial nerves intact. Normal muscle tone, no cerebellar symptoms. Psych: Awake and oriented X 3, normal affect, Insight and Judgment appropriate.    Vicie Mutters, PA-C 10:40  AM [facility]

## 2014-04-01 NOTE — Patient Instructions (Addendum)
Benefiber is good for constipation/diarrhea/irritable bowel syndrome, it helps with weight loss and can help lower your bad cholesterol. Please do 1-2 TBSP in the morning in water, coffee, or tea. It can take up to a month before you can see a difference with your bowel movements. It is cheapest from [employer], [employer], [employer].   Your ears and sinuses are connected by the eustachian tube. When your sinuses are inflamed, this can close off the tube and cause fluid to collect in your middle ear. This can then cause dizziness, popping, clicking, ringing, and echoing in your ears. This is often NOT an infection and does NOT require antibiotics, it is caused by inflammation so the treatments help the inflammation. This can take a long time to get better so please be patient.  Here are things you can do to help with this: - Try the Flonase or Nasonex. Remember to spray each nostril twice towards the outer part of your eye.  Do not sniff but instead pinch your nose and tilt your head back to help the medicine get into your sinuses.  The best time to do this is at bedtime.Stop if you get blurred vision or nose bleeds.  -While drinking fluids, pinch and hold nose close and swallow, to help open eustachian tubes to drain fluid behind ear drums. -Please pick one of the over the counter allergy medications below and take it once daily for allergies.  It will also help with fluid behind ear drums. Claritin or loratadine cheapest but likely the weakest  Zyrtec or certizine at night because it can make you sleepy The strongest is allegra or fexafinadine  Cheapest at [employer], [employer], [employer] -can use decongestant over the counter, please do not use if you have high blood pressure or certain heart conditions.   if worsening HA, changes vision/speech, imbalance, weakness go to the ER   Before you even begin to attack a weight-loss plan, it pays to remember this: You are not fat. You have fat. Losing weight isn't about blame  or shame; it's simply another achievement to accomplish. Dieting is like any other skill-you have to buckle down and work at it. As long as you act in a smart, reasonable way, you'll ultimately get where you want to be. Here are some weight loss pearls for you.  1. It's Not a Diet. It's a Lifestyle Thinking of a diet as something you're on and suffering through only for the short term doesn't work. To shed weight and keep it off, you need to make permanent changes to the way you eat. It's OK to indulge occasionally, of course, but if you cut calories temporarily and then revert to your old way of eating, you'll gain back the weight quicker than you can say yo-yo. Use it to lose it. Research shows that one of the best predictors of long-term weight loss is how many pounds you drop in the first month. For that reason, nutritionists often suggest being stricter for the first two weeks of your new eating strategy to build momentum. Cut out added sugar and alcohol and avoid unrefined carbs. After that, figure out how you can reincorporate them in a way that's healthy and maintainable.  2. There's a Right Way to Exercise Working out burns calories and fat and boosts your metabolism by building muscle. But those trying to lose weight are notorious for overestimating the number of calories they burn and underestimating the amount they take in. Unfortunately, your system is biologically programmed to  hold on to extra pounds and that means when you start exercising, your body senses the deficit and ramps up its hunger signals. If you're not diligent, you'll eat everything you burn and then some. Use it to lose it. Cardio gets all the exercise glory, but strength and interval training are the real heroes. They help you build lean muscle, which in turn increases your metabolism and calorie-burning ability 3. Don't Overreact to Mild Hunger Some people have a hard time losing weight because of hunger anxiety. To them,  being hungry is bad-something to be avoided at all costs-so they carry snacks with them and eat when they don't need to. Others eat because they're stressed out or bored. While you never want to get to the point of being ravenous (that's when bingeing is likely to happen), a hunger pang, a craving, or the fact that it's 3:00 p.m. should not send you racing for the vending machine or obsessing about the energy bar in your purse. Ideally, you should put off eating until your stomach is growling and it's difficult to concentrate.  Use it to lose it. When you feel the urge to eat, use the HALT method. Ask yourself, Am I really hungry? Or am I angry or anxious, lonely or bored, or tired? If you're still not certain, try the apple test. If you're truly hungry, an apple should seem delicious; if it doesn't, something else is going on. Or you can try drinking water and making yourself busy, if you are still hungry try a healthy snack.  4. Not All Calories Are Created Equal The mechanics of weight loss are pretty simple: Take in fewer calories than you use for energy. But the kind of food you eat makes all the difference. Processed food that's high in saturated fat and refined starch or sugar can cause inflammation that disrupts the hormone signals that tell your brain you're full. The result: You eat a lot more.  Use it to lose it. Clean up your diet. Swap in whole, unprocessed foods, including vegetables, lean protein, and healthy fats that will fill you up and give you the biggest nutritional bang for your calorie buck. In a few weeks, as your brain starts receiving regular hunger and fullness signals once again, you'll notice that you feel less hungry overall and naturally start cutting back on the amount you eat.  5. Protein, Produce, and Plant-Based Fats Are Your Weight-Loss Trinity Here's why eating the three Ps regularly will help you drop pounds. Protein fills you up. You need it to build lean muscle, which  keeps your metabolism humming so that you can torch more fat. People in a weight-loss program who ate double the recommended daily allowance for protein (about 110 grams for a 150-pound woman) lost 70 percent of their weight from fat, while people who ate the RDA lost only about 40 percent, one study found. Produce is packed with filling fiber. "It's very difficult to consume too many calories if you're eating a lot of vegetables. Example: Three cups of broccoli is a lot of food, yet only 93 calories. (Fruit is another story. It can be easy to overeat and can contain a lot of calories from sugar, so be sure to monitor your intake.) Plant-based fats like olive oil and those in avocados and nuts are healthy and extra satiating.  Use it to lose it. Aim to incorporate each of the three Ps into every meal and snack. People who eat protein throughout the day are  able to keep weight off, according to a study in the [place] of Clinical Nutrition. In addition to meat, poultry and seafood, good sources are beans, lentils, eggs, tofu, and yogurt. As for fat, keep portion sizes in check by measuring out salad dressing, oil, and nut butters (shoot for one to two tablespoons). Finally, eat veggies or a little fruit at every meal. People who did that consumed 308 fewer calories but didn't feel any hungrier than when they didn't eat more produce.  7. How You Eat Is As Important As What You Eat In order for your brain to register that you're full, you need to focus on what you're eating. Sit down whenever you eat, preferably at a table. Turn off the TV or computer, put down your phone, and look at your food. Smell it. Chew slowly, and don't put another bite on your fork until you swallow. When women ate lunch this attentively, they consumed 30 percent less when snacking later than those who listened to an audiobook at lunchtime, according to a study in the [place] of Nutrition. 8. Weighing Yourself Really  Works The scale provides the best evidence about whether your efforts are paying off. Seeing the numbers tick up or down or stagnate is motivation to keep going-or to rethink your approach. A 2015 study at [facility] found that daily weigh-ins helped people lose more weight, keep it off, and maintain that loss, even after two years. Use it to lose it. Step on the scale at the same time every day for the best results. If your weight shoots up several pounds from one weigh-in to the next, don't freak out. Eating a lot of salt the night before or having your period is the likely culprit. The number should return to normal in a day or two. It's a steady climb that you need to do something about. 9. Too Much Stress and Too Little Sleep Are Your Enemies When you're tired and frazzled, your body cranks up the production of cortisol, the stress hormone that can cause carb cravings. Not getting enough sleep also boosts your levels of ghrelin, a hormone associated with hunger, while suppressing leptin, a hormone that signals fullness and satiety. People on a diet who slept only five and a half hours a night for two weeks lost 55 percent less fat and were hungrier than those who slept eight and a half hours, according to a study in the [place]. Use it to lose it. Prioritize sleep, aiming for seven hours or more a night, which research shows helps lower stress. And make sure you're getting quality zzz's. If a snoring spouse or a fidgety cat wakes you up frequently throughout the night, you may end up getting the equivalent of just four hours of sleep, according to a study from [facility]. Keep pets out of the bedroom, and use a white-noise app to drown out snoring. 10. You Will Hit a plateau-And You Can Bust Through It As you slim down, your body releases much less leptin, the fullness hormone.  If you're not strength training, start right now. Building muscle can raise your  metabolism to help you overcome a plateau. To keep your body challenged and burning calories, incorporate new moves and more intense intervals into your workouts or add another sweat session to your weekly routine. Alternatively, cut an extra 100 calories or so a day from your diet. Now that you've lost weight, your body simply doesn't need as  much fuel.

## 2014-04-02 LAB — HEMOGLOBIN A1C
HEMOGLOBIN A1C: 5.7 % — AB (ref <5.7)
Mean Plasma Glucose: 117 mg/dL — ABNORMAL HIGH (ref <117)

## 2014-04-02 LAB — VITAMIN D 25 HYDROXY (VIT D DEFICIENCY, FRACTURES): Vit D, 25-Hydroxy: 25 ng/mL — ABNORMAL LOW (ref 30–100)

## 2014-05-07 ENCOUNTER — Emergency Department (INDEPENDENT_AMBULATORY_CARE_PROVIDER_SITE_OTHER): Payer: BLUE CROSS/BLUE SHIELD

## 2014-05-07 ENCOUNTER — Encounter (HOSPITAL_COMMUNITY): Payer: Self-pay | Admitting: Emergency Medicine

## 2014-05-07 ENCOUNTER — Other Ambulatory Visit: Payer: Self-pay | Admitting: Cardiovascular Disease

## 2014-05-07 ENCOUNTER — Emergency Department (INDEPENDENT_AMBULATORY_CARE_PROVIDER_SITE_OTHER)
Admission: EM | Admit: 2014-05-07 | Discharge: 2014-05-07 | Disposition: A | Discharge status: Home / Self Care | Payer: BLUE CROSS/BLUE SHIELD | Source: Home / Self Care | Attending: Family Medicine | Admitting: Family Medicine

## 2014-05-07 DIAGNOSIS — M79672 Pain in left foot: Secondary | ICD-10-CM

## 2014-05-07 LAB — POCT I-STAT, CHEM 8
BUN: 18 mg/dL (ref 6–23)
CHLORIDE: 101 mmol/L (ref 96–112)
Calcium, Ion: 1.19 mmol/L (ref 1.12–1.23)
Creatinine, Ser: 1 mg/dL (ref 0.50–1.10)
Glucose, Bld: 88 mg/dL (ref 70–99)
HEMATOCRIT: 36 % (ref 36.0–46.0)
HEMOGLOBIN: 12.2 g/dL (ref 12.0–15.0)
POTASSIUM: 3.8 mmol/L (ref 3.5–5.1)
SODIUM: 140 mmol/L (ref 135–145)
TCO2: 25 mmol/L (ref 0–100)

## 2014-05-07 LAB — URIC ACID: URIC ACID, SERUM: 8.6 mg/dL — AB (ref 2.4–7.0)

## 2014-05-07 MED ORDER — HYDROCODONE-ACETAMINOPHEN 5-325 MG PO TABS: 1.0000 | ORAL_TABLET | Freq: Four times a day (QID) | ORAL | Status: DC | PRN

## 2014-05-07 MED ORDER — COLCHICINE 0.6 MG PO TABS: 0.6000 mg | ORAL_TABLET | Freq: Every day | ORAL | Status: DC

## 2014-05-07 NOTE — Discharge Instructions (Signed)
Thank you for coming in today. Take the colchicine daily for at least 1 week.  Use the norco for severe pain.  Use the crutches and cam walker as needed.  Follow up with your primary doctor and podiatrist.  Return as needed.   Gout Gout is an inflammatory arthritis caused by a buildup of uric acid crystals in the joints. Uric acid is a chemical that is normally present in the blood. When the level of uric acid in the blood is too high it can form crystals that deposit in your joints and tissues. This causes joint redness, soreness, and swelling (inflammation). Repeat attacks are common. Over time, uric acid crystals can form into masses (tophi) near a joint, destroying bone and causing disfigurement. Gout is treatable and often preventable. CAUSES  The disease begins with elevated levels of uric acid in the blood. Uric acid is produced by your body when it breaks down a naturally found substance called purines. Certain foods you eat, such as meats and fish, contain high amounts of purines. Causes of an elevated uric acid level include:  Being passed down from parent to child (heredity).  Diseases that cause increased uric acid production (such as obesity, psoriasis, and certain cancers).  Excessive alcohol use.  Diet, especially diets rich in meat and seafood.  Medicines, including certain cancer-fighting medicines (chemotherapy), water pills (diuretics), and aspirin.  Chronic kidney disease. The kidneys are no longer able to remove uric acid well.  Problems with metabolism. Conditions strongly associated with gout include:  Obesity.  High blood pressure.  High cholesterol.  Diabetes. Not everyone with elevated uric acid levels gets gout. It is not understood why some people get gout and others do not. Surgery, joint injury, and eating too much of certain foods are some of the factors that can lead to gout attacks. SYMPTOMS   An attack of gout comes on quickly. It causes intense  pain with redness, swelling, and warmth in a joint.  Fever can occur.  Often, only one joint is involved. Certain joints are more commonly involved:  Base of the big toe.  Knee.  Ankle.  Wrist.  Finger. Without treatment, an attack usually goes away in a few days to weeks. Between attacks, you usually will not have symptoms, which is different from many other forms of arthritis. DIAGNOSIS  Your caregiver will suspect gout based on your symptoms and exam. In some cases, tests may be recommended. The tests may include:  Blood tests.  Urine tests.  X-rays.  Joint fluid exam. This exam requires a needle to remove fluid from the joint (arthrocentesis). Using a microscope, gout is confirmed when uric acid crystals are seen in the joint fluid. TREATMENT  There are two phases to gout treatment: treating the sudden onset (acute) attack and preventing attacks (prophylaxis).  Treatment of an Acute Attack.  Medicines are used. These include anti-inflammatory medicines or steroid medicines.  An injection of steroid medicine into the affected joint is sometimes necessary.  The painful joint is rested. Movement can worsen the arthritis.  You may use warm or cold treatments on painful joints, depending which works best for you.  Treatment to Prevent Attacks.  If you suffer from frequent gout attacks, your caregiver may advise preventive medicine. These medicines are started after the acute attack subsides. These medicines either help your kidneys eliminate uric acid from your body or decrease your uric acid production. You may need to stay on these medicines for a very long time.  The early phase of treatment with preventive medicine can be associated with an increase in acute gout attacks. For this reason, during the first few months of treatment, your caregiver may also advise you to take medicines usually used for acute gout treatment. Be sure you understand your caregiver's directions.  Your caregiver may make several adjustments to your medicine dose before these medicines are effective.  Discuss dietary treatment with your caregiver or dietitian. Alcohol and drinks high in sugar and fructose and foods such as meat, poultry, and seafood can increase uric acid levels. Your caregiver or dietitian can advise you on drinks and foods that should be limited. HOME CARE INSTRUCTIONS   Do not take aspirin to relieve pain. This raises uric acid levels.  Only take over-the-counter or prescription medicines for pain, discomfort, or fever as directed by your caregiver.  Rest the joint as much as possible. When in bed, keep sheets and blankets off painful areas.  Keep the affected joint raised (elevated).  Apply warm or cold treatments to painful joints. Use of warm or cold treatments depends on which works best for you.  Use crutches if the painful joint is in your leg.  Drink enough fluids to keep your urine clear or pale yellow. This helps your body get rid of uric acid. Limit alcohol, sugary drinks, and fructose drinks.  Follow your dietary instructions. Pay careful attention to the amount of protein you eat. Your daily diet should emphasize fruits, vegetables, whole grains, and fat-free or low-fat milk products. Discuss the use of coffee, vitamin C, and cherries with your caregiver or dietitian. These may be helpful in lowering uric acid levels.  Maintain a healthy body weight. SEEK MEDICAL CARE IF:   You develop diarrhea, vomiting, or any side effects from medicines.  You do not feel better in 24 hours, or you are getting worse. SEEK IMMEDIATE MEDICAL CARE IF:   Your joint becomes suddenly more tender, and you have chills or a fever. MAKE SURE YOU:   Understand these instructions.  Will watch your condition.  Will get help right away if you are not doing well or get worse. Document Released: 01/07/2000 Document Revised: 05/26/2013 Document Reviewed:  08/23/2011 [facility] Patient Information 2015 [place], [place]. This information is not intended to replace advice given to you by your health care provider. Make sure you discuss any questions you have with your health care provider.

## 2014-05-07 NOTE — ED Provider Notes (Signed)
Yolanda Espinoza is a 59 y.o. female who presents to Urgent Care today for left foot pain. Patient has a history of foot surgery about 10 years ago on her left foot. She is doing pretty well until 3 weeks ago when she developed swelling of the dorsal left foot and around the area of previous surgery. She developed exquisite pain yesterday. She notes ankle swelling and pain as well. She denies any injury. No fevers or chills nausea vomiting or diarrhea. No history of gout to her knowledge. She has tried some over-the-counter medicines for pain which has not helped. She is scheduled appointment with her podiatrist for 4 days from now.   Past Medical History  Diagnosis Date  . Subarachnoid hemorrhage due to ruptured aneurysm     2003 - s/p coiling  . Hypertension may, 1973  . Mitral regurgitation     a. Echo 01/2012 mild LVH, EF 55-65%, Gr 2 DD, mod MR, mild LAE, PASP 36;  b. Echo (02/2013):  EF 55-60%, Gr 1 DD, mild to mod MR, mild LAE (no sig change since prior echo)  . Chronic diastolic heart failure   . Hyperlipidemia   . Unspecified vitamin D deficiency   . Heart murmur   . Stroke 07    no weakness  . Peripheral vascular disease     cerebral aneursym  . OSA on CPAP     7 yrs  . GERD (gastroesophageal reflux disease)   . Arthritis    Past Surgical History  Procedure Laterality Date  . Aneurysm coiling    . Head surgery      aneurysm ruptured- stroke  . Abdominal hysterectomy    . Tonsillectomy and adenoidectomy    . Radiology with anesthesia N/A 04/23/2013    Procedure: RADIOLOGY WITH ANESTHESIA;  Surgeon: Rob Hickman, MD;  Location: [facility];  Service: Radiology;  Laterality: N/A;   History  Substance Use Topics  . Smoking status: Former Smoker -- 0.25 packs/day for 3 years    Types: Cigarettes    Quit date: 01/24/1999  . Smokeless tobacco: Not on file  . Alcohol Use: No   ROS as above Medications: No current facility-administered medications for this encounter.   Current  Outpatient Prescriptions  Medication Sig Dispense Refill  . amLODipine-valsartan (EXFORGE) 10-320 MG per tablet TAKE ONE TABLET BY MOUTH ONCE DAILY 90 tablet 0  . aspirin 325 MG tablet Take 325 mg by mouth daily.    . colchicine 0.6 MG tablet Take 1 tablet (0.6 mg total) by mouth daily. 30 tablet 0  . furosemide (LASIX) 40 MG tablet TAKE ONE TABLET BY MOUTH ONCE DAILY 90 tablet 0  . HYDROcodone-acetaminophen (NORCO/VICODIN) 5-325 MG per tablet Take 1 tablet by mouth every 6 (six) hours as needed. 15 tablet 0  . metoprolol succinate (TOPROL-XL) 25 MG 24 hr tablet TAKE ONE TABLET BY MOUTH ONCE DAILY 30 tablet 11  . Multiple Vitamins-Minerals (MULTIVITAMIN WITH MINERALS) tablet Take 1 tablet by mouth daily.    . Omega-3 Fatty Acids (FISH OIL) 1200 MG CAPS Take 2,400 mg by mouth daily.     . pantoprazole (PROTONIX) 40 MG tablet Take 1 tablet (40 mg total) by mouth daily.    . potassium chloride (K-DUR) 10 MEQ tablet TAKE ONE TABLET BY MOUTH ONCE DAILY 90 tablet 1  . PREMARIN 0.9 MG tablet TAKE ONE TABLET BY MOUTH DAILY 30 tablet 5  . vitamin B-12 (CYANOCOBALAMIN) 1000 MCG tablet Take 1,000 mcg by mouth daily.  Allergies  Allergen Reactions  . Aspirin Nausea And Vomiting  . Ace Inhibitors Nausea And Vomiting     Exam:  BP 130/92 mmHg  Pulse 74  Temp(Src) 98.2 F (36.8 C) (Oral)  Resp 16 Gen: Well NAD HEENT: EOMI,  MMM Lungs: Normal work of breathing. CTABL Heart: RRR no MRG Abd: NABS, Soft. Nondistended, Nontender Exts: Brisk capillary refill, warm and well perfused.  Left ankle: Effusion diffusely tender decreased motion secondary to pain. Left foot well-appearing scar. Swollen tender area dorsal midfoot. Soft to touch. No fluctuance or induration or erythema present. Pulses intact. Cap refill sensation and motion are intact distally.   Limited musculoskeletal ultrasound of the dorsal midfoot. Joint narrowing of the midfoot bones especially the joints between the navicular and  cuneiform bones in the navicular and the talus. The navicular cuneiform joint has a positive mushroomed sign and effusion. The neurovascular bundles clearly seen overlying the effusion with intact flow seen on Doppler. Impression joint effusion.   Results for orders placed or performed during the hospital encounter of 05/07/14 (from the past 24 hour(s))  Uric acid     Status: Abnormal   Collection Time: 05/07/14 12:43 PM  Result Value Ref Range   Uric Acid, Serum 8.6 (H) 2.4 - 7.0 mg/dL  I-STAT, chem 8     Status: None   Collection Time: 05/07/14 12:50 PM  Result Value Ref Range   Sodium 140 135 - 145 mmol/L   Potassium 3.8 3.5 - 5.1 mmol/L   Chloride 101 96 - 112 mmol/L   BUN 18 6 - 23 mg/dL   Creatinine, Ser 1.00 0.50 - 1.10 mg/dL   Glucose, Bld 88 70 - 99 mg/dL   Calcium, Ion 1.19 1.12 - 1.23 mmol/L   TCO2 25 0 - 100 mmol/L   Hemoglobin 12.2 12.0 - 15.0 g/dL   HCT 36.0 36.0 - 46.0 %   Dg Ankle Complete Left  05/07/2014   CLINICAL DATA:  Per pt: swelling and redness with no pain or injury to the left ankle. The left ankle is swollen, redness medially. No prior injury, no surgery to the left ankle. Patient is not a diabetic. Patient has had three strokes,  EXAM: LEFT ANKLE COMPLETE - 3+ VIEW  COMPARISON:  None.  FINDINGS: There is diffuse soft tissue swelling. There is no fracture or bone lesion. There are no areas of bone resorption to suggest osteomyelitis. Ankle joint is normally spaced and aligned. There are small plantar and dorsal calcaneal spurs.  IMPRESSION: No fracture or ankle joint abnormality. No evidence of osteomyelitis. Diffuse nonspecific soft tissue swelling.   Electronically Signed   By: Lajean Manes M.D.   On: 05/07/2014 13:44   Dg Foot Complete Left  05/07/2014   CLINICAL DATA:  Left foot pain, redness and swelling beginning 05/06/2014. Initial encounter.  EXAM: LEFT FOOT - COMPLETE 3+ VIEW  COMPARISON:  None.  FINDINGS: No acute bony or joint abnormality is  identified. Midfoot degenerative change is seen. Mild soft tissue swelling is noted about the foot.  IMPRESSION: Soft tissue swelling without underlying acute bony or joint abnormality.  Midfoot degenerative disease.   Electronically Signed   By: Inge Rise M.D.   On: 05/07/2014 13:33    Assessment and Plan: 59 y.o. female with left foot swelling without pain. Ultrasound shows joint effusion and uric acid is 8.6. I am concerned for a gout flare. Treat with colchicine and Norco. Offered foot immobilization with Cam Walker boot and crutches for  use as needed for pain. Patient will follow-up with her podiatrist in the near future.  Discussed warning signs or symptoms. Please see discharge instructions. Patient expresses understanding.     Gregor Hams, MD 05/07/14 [phone]

## 2014-05-07 NOTE — ED Notes (Signed)
Left foot pain, no known injury. Reports having surgery on this foot about 9 years ago.  Reports there has been no issues since surgery.  Pedal pulse 2 plus.  Visible swelling in foot and ankle.  Noted red area to lower leg.  Patient has no explanation for this redness

## 2014-05-10 NOTE — ED Notes (Signed)
Uric acid levels above normal rang. Patient treated w colchicine and hydrocodone for pain . Left VM message on cell phone to use the Rx as prescribed, and to follow up with her PCP as directed. No further action required

## 2014-05-11 ENCOUNTER — Encounter: Payer: Self-pay | Admitting: Podiatry

## 2014-05-11 ENCOUNTER — Ambulatory Visit (INDEPENDENT_AMBULATORY_CARE_PROVIDER_SITE_OTHER): Payer: BLUE CROSS/BLUE SHIELD | Admitting: Podiatry

## 2014-05-11 ENCOUNTER — Ambulatory Visit: Payer: BLUE CROSS/BLUE SHIELD

## 2014-05-11 VITALS — BP 146/86 | HR 72 | Resp 18

## 2014-05-11 DIAGNOSIS — M779 Enthesopathy, unspecified: Secondary | ICD-10-CM

## 2014-05-11 DIAGNOSIS — M79672 Pain in left foot: Secondary | ICD-10-CM

## 2014-05-11 MED ORDER — TRIAMCINOLONE ACETONIDE 10 MG/ML IJ SUSP
10.0000 mg | Freq: Once | INTRAMUSCULAR | Status: AC
  Administered 2014-05-11: 10 mg

## 2014-05-11 NOTE — Progress Notes (Signed)
   Subjective:    Patient ID: Yolanda Espinoza, female    DOB: 1955-07-18, 59 y.o.   MRN: [id number]  HPI  I WENT TO URGENT CARE LAST Tuesday 05/05/14 DUE TO HAVING GOUT AND THEY DID X-RAYS AND THEY GAVE ME GOUT MEDICINE BUT I COULD NOT AFFORD TO TAKE IT AND IT SORE AND TENDER AND HAD SURGERY FIVE YEARS AGO   Review of Systems  All other systems reviewed and are negative.      Objective:   Physical Exam        Assessment & Plan:

## 2014-05-12 NOTE — Progress Notes (Signed)
Subjective:     Patient ID: Yolanda Espinoza, female   DOB: 03-11-1955, 59 y.o.   MRN: [id number]  HPI patient presents with pain on top of her left foot stating that it's been hurting her and making it hard to wear shoe gear comfortably. Does not remember specific injury and she did have surgery on this area 5 years ago   Review of Systems  All other systems reviewed and are negative.      Objective:   Physical Exam  Constitutional: She is oriented to person, place, and time.  Cardiovascular: Intact distal pulses.   Musculoskeletal: Normal range of motion.  Neurological: She is oriented to person, place, and time.  Skin: Skin is warm.  Nursing note and vitals reviewed.  neurovascular status intact with muscle strength adequate and range of motion of the subtalar midtarsal joint within normal limits. Patient's noted to have significant discomfort in the midtarsal joint area left with inflammation and fluid and a old scar that is healed well with no issues. There is no other areas of redness or swelling and she does have good digital perfusion     Assessment:     Probable inflammatory tendinitis dorsal left foot which does not appear at this time to be systemic gout or other disease    Plan:     Reviewed condition and x-rays. Today I went ahead and I injected the dorsal tendon complex 3 mg Kenalog 5 mg Xylocaine advised on heat and ice therapy and wider-type shoes and if symptoms were to occur or recur she is to come back for me to check.

## 2014-05-13 ENCOUNTER — Other Ambulatory Visit: Payer: Self-pay | Admitting: Internal Medicine

## 2014-05-15 ENCOUNTER — Telehealth (HOSPITAL_COMMUNITY): Payer: Self-pay | Admitting: Interventional Radiology

## 2014-05-15 NOTE — Telephone Encounter (Signed)
Returned pt's call, no answer and no VM. JM

## 2014-06-01 ENCOUNTER — Other Ambulatory Visit: Payer: Self-pay | Admitting: Internal Medicine

## 2014-07-08 ENCOUNTER — Ambulatory Visit: Payer: Self-pay | Admitting: Physician Assistant

## 2014-07-15 ENCOUNTER — Encounter: Payer: Self-pay | Admitting: Physician Assistant

## 2014-07-15 ENCOUNTER — Ambulatory Visit (INDEPENDENT_AMBULATORY_CARE_PROVIDER_SITE_OTHER): Payer: BLUE CROSS/BLUE SHIELD | Admitting: Physician Assistant

## 2014-07-15 ENCOUNTER — Other Ambulatory Visit: Payer: Self-pay | Admitting: Physician Assistant

## 2014-07-15 VITALS — BP 148/82 | HR 70 | Temp 98.2°F | Resp 18 | Ht 70.0 in | Wt 225.0 lb

## 2014-07-15 DIAGNOSIS — G4733 Obstructive sleep apnea (adult) (pediatric): Secondary | ICD-10-CM

## 2014-07-15 DIAGNOSIS — Z9989 Dependence on other enabling machines and devices: Secondary | ICD-10-CM

## 2014-07-15 DIAGNOSIS — D519 Vitamin B12 deficiency anemia, unspecified: Secondary | ICD-10-CM

## 2014-07-15 DIAGNOSIS — R7309 Other abnormal glucose: Secondary | ICD-10-CM

## 2014-07-15 DIAGNOSIS — E559 Vitamin D deficiency, unspecified: Secondary | ICD-10-CM

## 2014-07-15 DIAGNOSIS — E785 Hyperlipidemia, unspecified: Secondary | ICD-10-CM

## 2014-07-15 DIAGNOSIS — I10 Essential (primary) hypertension: Secondary | ICD-10-CM

## 2014-07-15 DIAGNOSIS — M10079 Idiopathic gout, unspecified ankle and foot: Secondary | ICD-10-CM

## 2014-07-15 DIAGNOSIS — E669 Obesity, unspecified: Secondary | ICD-10-CM

## 2014-07-15 DIAGNOSIS — Z79899 Other long term (current) drug therapy: Secondary | ICD-10-CM

## 2014-07-15 DIAGNOSIS — D649 Anemia, unspecified: Secondary | ICD-10-CM

## 2014-07-15 DIAGNOSIS — M109 Gout, unspecified: Secondary | ICD-10-CM

## 2014-07-15 DIAGNOSIS — R7303 Prediabetes: Secondary | ICD-10-CM

## 2014-07-15 LAB — BASIC METABOLIC PANEL WITH GFR
BUN: 11 mg/dL (ref 6–23)
CO2: 28 mEq/L (ref 19–32)
Calcium: 9.3 mg/dL (ref 8.4–10.5)
Chloride: 102 mEq/L (ref 96–112)
Creat: 1 mg/dL (ref 0.50–1.10)
GFR, Est African American: 72 mL/min
GFR, Est Non African American: 62 mL/min
GLUCOSE: 75 mg/dL (ref 70–99)
Potassium: 4.4 mEq/L (ref 3.5–5.3)
Sodium: 138 mEq/L (ref 135–145)

## 2014-07-15 LAB — HEPATIC FUNCTION PANEL
ALBUMIN: 4 g/dL (ref 3.5–5.2)
ALK PHOS: 53 U/L (ref 39–117)
ALT: 9 U/L (ref 0–35)
AST: 16 U/L (ref 0–37)
BILIRUBIN DIRECT: 0.1 mg/dL (ref 0.0–0.3)
Indirect Bilirubin: 0.2 mg/dL (ref 0.2–1.2)
Total Bilirubin: 0.3 mg/dL (ref 0.2–1.2)
Total Protein: 7.1 g/dL (ref 6.0–8.3)

## 2014-07-15 LAB — CBC WITH DIFFERENTIAL/PLATELET
Basophils Absolute: 0 10*3/uL (ref 0.0–0.1)
Basophils Relative: 0 % (ref 0–1)
EOS ABS: 0.2 10*3/uL (ref 0.0–0.7)
EOS PCT: 3 % (ref 0–5)
HCT: 30.1 % — ABNORMAL LOW (ref 36.0–46.0)
Hemoglobin: 10 g/dL — ABNORMAL LOW (ref 12.0–15.0)
Lymphocytes Relative: 48 % — ABNORMAL HIGH (ref 12–46)
Lymphs Abs: 3.2 10*3/uL (ref 0.7–4.0)
MCH: 26.8 pg (ref 26.0–34.0)
MCHC: 33.2 g/dL (ref 30.0–36.0)
MCV: 80.7 fL (ref 78.0–100.0)
MONOS PCT: 5 % (ref 3–12)
MPV: 9.2 fL (ref 8.6–12.4)
Monocytes Absolute: 0.3 10*3/uL (ref 0.1–1.0)
Neutro Abs: 2.9 10*3/uL (ref 1.7–7.7)
Neutrophils Relative %: 44 % (ref 43–77)
Platelets: 257 10*3/uL (ref 150–400)
RBC: 3.73 MIL/uL — ABNORMAL LOW (ref 3.87–5.11)
RDW: 16.8 % — AB (ref 11.5–15.5)
WBC: 6.7 10*3/uL (ref 4.0–10.5)

## 2014-07-15 LAB — URIC ACID: URIC ACID, SERUM: 6.3 mg/dL (ref 2.4–7.0)

## 2014-07-15 LAB — LIPID PANEL
CHOL/HDL RATIO: 1.6 ratio
Cholesterol: 167 mg/dL (ref 0–200)
HDL: 107 mg/dL (ref >=46)
LDL Cholesterol: 37 mg/dL (ref 0–99)
Triglycerides: 113 mg/dL (ref <150)
VLDL: 23 mg/dL (ref 0–40)

## 2014-07-15 LAB — MAGNESIUM: MAGNESIUM: 1.7 mg/dL (ref 1.5–2.5)

## 2014-07-15 LAB — TSH: TSH: 1.786 u[IU]/mL (ref 0.350–4.500)

## 2014-07-15 MED ORDER — ALLOPURINOL 300 MG PO TABS: 300.0000 mg | ORAL_TABLET | Freq: Every day | ORAL | Status: DC

## 2014-07-15 MED ORDER — PREDNISONE 20 MG PO TABS: ORAL_TABLET | ORAL | Status: DC

## 2014-07-15 MED ORDER — COLCHICINE 0.6 MG PO TABS: 0.6000 mg | ORAL_TABLET | Freq: Every day | ORAL | Status: DC

## 2014-07-15 NOTE — Progress Notes (Signed)
Assessment and Plan:  Hypertension: Continue medication, monitor blood pressure at home. Continue DASH diet.  Reminder to go to the ER if any CP, SOB, nausea, dizziness, severe HA, changes vision/speech, left arm numbness and tingling, and jaw pain. Cholesterol: Continue diet and exercise. Check cholesterol.  Pre-diabetes-Continue diet and exercise. Check A1C Vitamin D Def- check level and continue medications.  Obesity with co morbidities- long discussion about weight loss, diet, and exercise Sleep apnea- continue CPAP, weight loss advised.  Hx Stroke-Control blood pressure, cholesterol, glucose, increase exercise. Continue ASA Edema- had elevated uric acid at urgent care, will start on colchicine, pulse dose of prednisone and see her back in 2 weeks to start allopurinol, otherwise elevate feet, get compression stockings and continue lasix.   Continue diet and meds as discussed. Further disposition pending results of labs.  HPI 59 y.o. female  presents for 3 month follow up  has a past medical history of Subarachnoid hemorrhage due to ruptured aneurysm; Hypertension (may, 1973); Mitral regurgitation; Chronic diastolic heart failure; Hyperlipidemia; Unspecified vitamin D deficiency; Heart murmur; Stroke (07); Peripheral vascular disease; OSA on CPAP; GERD (gastroesophageal reflux disease); and Arthritis.  Her blood pressure has been controlled at home, today their BP is BP: (!) 148/82 mmHg  She does workout. She denies chest pain, shortness of breath, dizziness. She has history of CVA due to aneurysm, PVD, and history of MR, follows with Dr. Cathie Olden. She is on 325 ASA.  She is not on cholesterol medication and denies myalgias. Her cholesterol is at goal. The cholesterol last visit was:   Lab Results  Component Value Date   CHOL 188 04/01/2014   HDL 101 04/01/2014   LDLCALC 58 04/01/2014   TRIG 145 04/01/2014   CHOLHDL 1.9 04/01/2014   She has been working on diet and exercise for prediabetes,  and denies paresthesia of the feet, polydipsia, polyuria and visual disturbances. Last A1C in the office was:  Lab Results  Component Value Date   HGBA1C 5.7* 04/01/2014  Patient is on Vitamin D supplement.   Lab Results  Component Value Date   VD25OH 25* 04/01/2014  Her husband has had several strokes and has early dementia, she is primary care giver.  BMI is Body mass index is 32.28 kg/(m^2)., she is working on diet and exercise and has lost weight.She has OSA and is on CPAP.  While in [place] she did have bilateral leg swelling, denies SOB, PND, orthopnea. Better with lying down/propping up legs, worse with walking. Had normal Xray of her ankle and no pain.  Wt Readings from Last 3 Encounters:  07/15/14 225 lb (102.059 kg)  04/01/14 221 lb (100.245 kg)  03/11/14 223 lb 12.8 oz (101.515 kg)       Current Medications:  Current Outpatient Prescriptions on File Prior to Visit  Medication Sig Dispense Refill  . amLODipine-valsartan (EXFORGE) 10-320 MG per tablet TAKE ONE TABLET BY MOUTH ONCE DAILY 90 tablet 3  . aspirin 325 MG tablet Take 325 mg by mouth daily.    . colchicine 0.6 MG tablet Take 1 tablet (0.6 mg total) by mouth daily. 30 tablet 0  . furosemide (LASIX) 40 MG tablet TAKE ONE TABLET BY MOUTH ONCE DAILY 90 tablet 0  . metoprolol succinate (TOPROL-XL) 25 MG 24 hr tablet TAKE ONE TABLET BY MOUTH ONCE DAILY 30 tablet 11  . Multiple Vitamins-Minerals (MULTIVITAMIN WITH MINERALS) tablet Take 1 tablet by mouth daily.    . Omega-3 Fatty Acids (FISH OIL) 1200 MG CAPS Take  2,400 mg by mouth daily.     . pantoprazole (PROTONIX) 40 MG tablet Take 1 tablet (40 mg total) by mouth daily.    . potassium chloride (K-DUR) 10 MEQ tablet TAKE ONE TABLET BY MOUTH ONCE DAILY 90 tablet 1  . PREMARIN 0.9 MG tablet TAKE ONE TABLET BY MOUTH DAILY 30 tablet 2  . vitamin B-12 (CYANOCOBALAMIN) 1000 MCG tablet Take 1,000 mcg by mouth daily.     No current facility-administered medications on file  prior to visit.   Medical History:  Past Medical History  Diagnosis Date  . Subarachnoid hemorrhage due to ruptured aneurysm     2003 - s/p coiling  . Hypertension may, 1973  . Mitral regurgitation     a. Echo 01/2012 mild LVH, EF 55-65%, Gr 2 DD, mod MR, mild LAE, PASP 36;  b. Echo (02/2013):  EF 55-60%, Gr 1 DD, mild to mod MR, mild LAE (no sig change since prior echo)  . Chronic diastolic heart failure   . Hyperlipidemia   . Unspecified vitamin D deficiency   . Heart murmur   . Stroke 07    no weakness  . Peripheral vascular disease     cerebral aneursym  . OSA on CPAP     7 yrs  . GERD (gastroesophageal reflux disease)   . Arthritis    Allergies:  Allergies  Allergen Reactions  . Aspirin Nausea And Vomiting  . Ace Inhibitors Nausea And Vomiting     Review of Systems:  Review of Systems  Constitutional: Negative.   HENT: Negative.   Respiratory: Negative.   Cardiovascular: Positive for leg swelling. Negative for chest pain, palpitations, orthopnea, claudication and PND.  Gastrointestinal: Negative.   Genitourinary: Negative.   Musculoskeletal: Negative.   Skin: Negative.   Neurological: Negative.   Psychiatric/Behavioral: Negative.     Family history- Review and unchanged Social history- Review and unchanged Physical Exam: BP 148/82 mmHg  Pulse 70  Temp(Src) 98.2 F (36.8 C) (Temporal)  Resp 18  Ht 5\' 10"  (1.778 m)  Wt 225 lb (102.059 kg)  BMI 32.28 kg/m2 Wt Readings from Last 3 Encounters:  07/15/14 225 lb (102.059 kg)  04/01/14 221 lb (100.245 kg)  03/11/14 223 lb 12.8 oz (101.515 kg)   General Appearance: Well nourished, in no apparent distress. Eyes: PERRLA, EOMs, conjunctiva no swelling or erythema Sinuses: No Frontal/maxillary tenderness ENT/Mouth: Ext aud canals clear, TMs without erythema, bulging. No erythema, swelling, or exudate on post pharynx.  Tonsils not swollen or erythematous. Hearing normal.  Neck: Supple, thyroid normal.   Respiratory: Respiratory effort normal, BS equal bilaterally without rales, rhonchi, wheezing or stridor.  Cardio: RRR with with holosystolic murmur. Brisk peripheral pulses with mild bilateral leg edema and very swollen bilateral ankles WITHOUT redness, tenderness. .  Abdomen: Soft, + BS,  Non tender, no guarding, rebound, hernias, masses. Lymphatics: Non tender without lymphadenopathy.  Musculoskeletal: Full ROM, 5/5 strength, Normal gait. Skin: Warm, dry without rashes, lesions, ecchymosis.  Neuro: Cranial nerves intact. Normal muscle tone, no cerebellar symptoms. Psych: Awake and oriented X 3, normal affect, Insight and Judgment appropriate.    Vicie Mutters, PA-C 2:32 PM [facility]

## 2014-07-15 NOTE — Patient Instructions (Addendum)
You can start on allopurinol 300mg , start on 1/2 pill a day This can sometime CAUSE a gout flare so I'm send in colchcine for you to take twice a day for 1 week  And I'm send in  Prednisone for you to take IF you get pain  Information for patients with Gout  Gout defined-Gout occurs when urate crystals accumulate in your joint causing the inflammation and intense pain of gout attack.  Urate crystals can form when you have high levels of uric acid in your blood.  Your body produces uric acid when it breaks down prurines-substances that are found naturally in your body, as well as in certain foods such as organ meats, anchioves, herring, asparagus, and mushrooms.  Normally uric acid dissolves in your blood and passes through your kidneys into your urine.  But sometimes your body either produces too much uric acid or your kidneys excrete too little uric acid.  When this happens, uric acid can build up, forming sharp needle-like urate crystals in a joint or surrounding tissue that cause pain, inflammation and swelling.    Gout is characterized by sudden, severe attacks of pain, redness and tenderness in joints, often the joint at the base of the big toe.  Gout is complex form of arthritis that can affect anyone.  Men are more likely to get gout but women become increasingly more susceptible to gout after menopause.  An acute attack of gout can wake you up in the middle of the night with the sensation that your big toe is on fire.  The affected joint is hot, swollen and so tender that even the weight or the sheet on it may seem intolerable.  If you experience symptoms of an acute gout attack it is important to your doctor as soon as the symptoms start.  Gout that goes untreated can lead to worsening pain and joint damage.  Risk Factors:  You are more likely to develop gout if you have high levels of uric acid in your body.    Factors that increase the uric acid level in your body include:  Lifestyle  factors.  Excessive alcohol use-generally more than two drinks a day for men and more than one for women increase the risk of gout.  Medical conditions.  Certain conditions make it more likely that you will develop gout.  These include hypertension, and chronic conditions such as diabetes, high levels of fat and cholesterol in the blood, and narrowing of the arteries.  Certain medications.  The uses of Thiazide diuretics- commonly used to treat hypertension and low dose aspirin can also increase uric acid levels.  Family history of gout.  If other members of your family have had gout, you are more likely to develop the disease.  Age and sex. Gout occurs more often in men than it does in women, primarily because women tend to have lower uric acid levels than men do.  Men are more likely to develop gout earlier usually between the ages of 67-50- whereas women generally develop signs and symptoms after menopause.    Tests and diagnosis:  Tests to help diagnose gout may include:  Blood test.  Your doctor may recommend a blood test to measure the uric acid level in your blood .  Blood tests can be misleading, though.  Some people have high uric acid levels but never experience gout.  And some people have signs and symptoms of gout, but don't have unusual levels of uric acid in their blood.  Joint fluid test.  Your doctor may use a needle to draw fluid from your affected joint.  When examined under the microscope, your joint fluid may reveal urate crystals.  Treatment:  Treatment for gout usually involves medications.  What medications you and your doctor choose will be based on your current health and other medications you currently take.  Gout medications can be used to treat acute gout attacks and prevent future attacks as well as reduce your risk of complications from gout such as the development of tophi from urate crystal deposits.  Alternative medicine:   Certain foods have been studied  for their potential to lower uric acid levels, including:  Coffee.  Studies have found an association between coffee drinking (regular and decaf) and lower uric acid levels.  The evidence is not enough to encourage non-coffee drinkers to start, but it may give clues to new ways of treating gout in the future.  Vitamin C.  Supplements containing vitamin C may reduce the levels of uric acid in your blood.  However, vitamin as a treatment for gout. Don't assume that if a little vitamin C is good, than lots is better.  Megadoses of vitamin C may increase your bodies uric acid levels.  Cherries.  Cherries have been associated with lower levels of uric acid in studies, but it isn't clear if they have any effect on gout signs and symptoms.  Eating more cherries and other dar-colored fruits, such as blackberries, blueberries, purple grapes and raspberries, may be a safe way to support your gout treatment.    Lifestyle/Diet Recommendations:   Drink 8 to 16 cups ( about 2 to 4 liters) of fluid each day, with at least half being water.  Avoid alcohol  Eat a moderate amount of protein, preferably from healthy sources, such as low-fat or fat-free dairy, tofu, eggs, and nut butters.  Limit you daily intake of meat, fish, and poultry to 4 to 6 ounces.  Avoid high fat meats and desserts.  Decrease you intake of shellfish, beef, lamb, pork, eggs and cheese.  Choose a good source of vitamin C daily such as citrus fruits, strawberries, broccoli,  brussel sprouts, papaya, and cantaloupe.   Choose a good source of vitamin A every other day such as yellow fruits, or dark green/yellow vegetables.  Avoid drastic weigh reduction or fasting.  If weigh loss is desired lose it over a period of several months.  See "dietary considerations.." chart for specific food recommendations.  Dietary Considerations for people with Gout  Food with negligible purine content (0-15 mg of purine nitrogen per 100 grams food)   May use as desired except on calorie variations  Non fat milk Cocoa Cereals (except in list II) Hard candies  Buttermilk Carbonated drinks Vegetables (except in list II) Sherbet  Coffee Fruits Sugar Honey  Tea Cottage Cheese Gelatin-jell-o Salt  Fruit juice Breads Angel food Cake   Herbs/spices Jams/Jellies [employer]    Foods that do not contain excessive purine content, but must be limited due to fat content  Cream Eggs Oil and Salad Dressing  Half and Half Peanut Butter Chocolate  Whole Milk Cakes Potato Chips  Butter Ice Cream Fried Foods  Cheese Nuts Waffles, pancakes   List II: Food with moderate purine content (50-150 mg of purine nitrogen per 100 grams of food)  Limit total amount each day to 5 oz. cooked Lean meat, other than those on list III   Poultry, other than those on list III  Fish, other than those on list III   Seafood, other than those on list III  These foods may be used occasionally  Peas Lentils Bran  Spinach Oatmeal Dried Beans and Peas  Asparagus Wheat Germ Mushrooms   Additional information about meat choices  Choose fish and poultry, particularly without skin, often.  Select lean, well trimmed cuts of meat.  Avoid all fatty meats, bacon , sausage, fried meats, fried fish, or poultry, luncheon meats, cold cuts, hot dogs, meats canned or frozen in gravy, spareribs and frozen and packaged prepared meats.   List III: Foods with HIGH purine content / Foods to AVOID (150-800 mg of purine nitrogen per 100 grams of food)  Anchovies Herring Meat Broths  Liver Mackerel Meat Extracts  Kidney Scallops Meat Drippings  Sardines Wild Game Mincemeat  Sweetbreads Goose Gravy  Heart Tongue Yeast, baker's and brewers   Commercial soups made with any of the foods listed in List II or List III  In addition avoid all alcoholic beverages   Varicose Veins Varicose veins are veins that have become enlarged and twisted. CAUSES This condition is the  result of valves in the veins not working properly. Valves in the veins help return blood from the leg to the heart. When your calf muscles squeeze, the blood moves up your leg then the valves close and this continues until the blood gets back to your heart.  If these valves are damaged, blood flows backwards and backs up into the veins in the leg near the skin OR if your are sitting/standing for a long time without using your calf muscles the blood will back up into the veins in your legs. This causes the veins to become larger. People who are on their feet a lot, sit a lot without walking (like on a plane, at a desk, or in a car), who are pregnant, or who are overweight are more likely to develop varicose veins. SYMPTOMS   Bulging, twisted-appearing, bluish veins, most commonly found on the legs.  Leg pain or a feeling of heaviness. These symptoms may be worse at the end of the day.  Leg swelling.  Skin color changes. DIAGNOSIS  Varicose veins can usually be diagnosed with an exam of your legs by your caregiver. He or she may recommend an ultrasound of your leg veins. TREATMENT  Most varicose veins can be treated at home.However, other treatments are available for people who have persistent symptoms or who want to treat the cosmetic appearance of the varicose veins. But this is only cosmetic and they will return if not properly treated. These include: 11. Laser treatment of very small varicose veins. 12. Medicine that is shot (injected) into the vein. This medicine hardens the walls of the vein and closes off the vein. This treatment is called sclerotherapy. Afterwards, you may need to wear clothing or bandages that apply pressure. 13. Surgery. HOME CARE INSTRUCTIONS   Do not stand or sit in one position for long periods of time. Do not sit with your legs crossed. Rest with your legs raised during the day.  Your legs have to be higher than your heart so that gravity will force the valves to  open, so please really elevate your legs.   Wear elastic stockings or support hose. Do not wear other tight, encircling garments around the legs, pelvis, or waist.  [employer]  has a wide variety of well priced compression stockings. [address] [place], [place] [address] [phone]  OR THERE ARE COPPER INFUSED COMPRESSION SOCKS AT [facility] OR [facility]  Walk as much as possible to increase blood flow.  Raise the foot of your bed at night with 2-inch blocks.  If you get a cut in the skin over the vein and the vein bleeds, lie down with your leg raised and press on it with a clean cloth until the bleeding stops. Then place a bandage (dressing) on the cut. See your caregiver if it continues to bleed or needs stitches. SEEK MEDICAL CARE IF:   The skin around your ankle starts to break down.  You have pain, redness, tenderness, or hard swelling developing in your leg over a vein.  You are uncomfortable due to leg pain. Document Released: 10/19/2004 Document Revised: 04/03/2011 Document Reviewed: 03/07/2010 [employer] Patient Information 2014 [place].  Recommendations For Diabetic/Prediabetic Patients:   -  Take medications as prescribed  -  Recommend Dr Fara Olden Fuhrman's book "The End of Diabetes "  And "The End of Dieting"- Can get at  www.[place].com and encourage also get the Audio CD book  - AVOID Animal products, ie. Meat - red/white, Poultry and Dairy/especially cheese - Exercise at least 5 times a week for 30 minutes or preferably daily.  - No Smoking - Drink less than 2 drinks a day.  - Monitor your feet for sores - Have yearly Eye Exams - Recommend annual Flu vaccine  - Recommend Pneumovax and Prevnar vaccines - Shingles Vaccine (Zostavax) if over 70 y.o.  Goals:   - BMI less than 24 - Fasting sugar less than 130 or less than 150 if tapering medicines to lose weight  - Systolic BP less than [id number]  - Diastolic BP less than 80 - Bad LDL Cholesterol less than 70 -  Triglycerides less than 150

## 2014-07-16 LAB — INSULIN, FASTING: INSULIN FASTING, SERUM: 5.3 u[IU]/mL (ref 2.0–19.6)

## 2014-07-16 LAB — HEMOGLOBIN A1C
Hgb A1c MFr Bld: 5.5 % (ref <5.7)
Mean Plasma Glucose: 111 mg/dL (ref <117)

## 2014-07-16 LAB — VITAMIN D 25 HYDROXY (VIT D DEFICIENCY, FRACTURES): VIT D 25 HYDROXY: 37 ng/mL (ref 30–100)

## 2014-07-17 LAB — IRON AND TIBC
%SAT: 15 % — ABNORMAL LOW (ref 20–55)
IRON: 50 ug/dL (ref 42–145)
TIBC: 337 ug/dL (ref 250–470)
UIBC: 287 ug/dL (ref 125–400)

## 2014-07-18 LAB — FERRITIN: Ferritin: 57 ng/mL (ref 10–291)

## 2014-07-18 LAB — VITAMIN B12: Vitamin B-12: 1679 pg/mL — ABNORMAL HIGH (ref 211–911)

## 2014-08-12 ENCOUNTER — Telehealth (HOSPITAL_COMMUNITY): Payer: Self-pay | Admitting: Interventional Radiology

## 2014-08-12 NOTE — Telephone Encounter (Signed)
Called pt to schedule MRI/MRA, no answer, no VM. JM

## 2014-08-18 ENCOUNTER — Other Ambulatory Visit: Payer: Self-pay | Admitting: Physician Assistant

## 2014-08-20 ENCOUNTER — Other Ambulatory Visit: Payer: Self-pay | Admitting: Physician Assistant

## 2014-08-27 ENCOUNTER — Encounter: Payer: Self-pay | Admitting: Internal Medicine

## 2014-08-27 ENCOUNTER — Other Ambulatory Visit: Payer: Self-pay | Admitting: Internal Medicine

## 2014-08-27 ENCOUNTER — Ambulatory Visit (INDEPENDENT_AMBULATORY_CARE_PROVIDER_SITE_OTHER): Payer: BLUE CROSS/BLUE SHIELD | Admitting: Internal Medicine

## 2014-08-27 DIAGNOSIS — M109 Gout, unspecified: Secondary | ICD-10-CM

## 2014-08-27 DIAGNOSIS — Z79899 Other long term (current) drug therapy: Secondary | ICD-10-CM | POA: Diagnosis not present

## 2014-08-27 DIAGNOSIS — M10079 Idiopathic gout, unspecified ankle and foot: Secondary | ICD-10-CM | POA: Diagnosis not present

## 2014-08-27 MED ORDER — CYCLOBENZAPRINE HCL 10 MG PO TABS: 10.0000 mg | ORAL_TABLET | Freq: Every day | ORAL | Status: DC

## 2014-08-27 MED ORDER — FEBUXOSTAT 80 MG PO TABS
1.0000 | ORAL_TABLET | Freq: Every day | ORAL | Status: DC
Start: 2014-08-27 — End: 2015-10-26

## 2014-08-27 MED ORDER — PREDNISONE 20 MG PO TABS: ORAL_TABLET | ORAL | Status: DC

## 2014-08-27 MED ORDER — FUROSEMIDE 80 MG PO TABS: 80.0000 mg | ORAL_TABLET | Freq: Every day | ORAL | Status: DC

## 2014-08-27 NOTE — Patient Instructions (Signed)
Please take the prednisone until it is gone to help with some of the swelling and pain in your foot.  Please stop the allopurinol and we will start the uloric daily.  You can take the flexeril as needed for pain.  Please increase the fluid pill to 80 mg daily.  Please make sure you take with potassium.  Get your compression stockings as well.  .  Gout Gout is an inflammatory arthritis caused by a buildup of uric acid crystals in the joints. Uric acid is a chemical that is normally present in the blood. When the level of uric acid in the blood is too high it can form crystals that deposit in your joints and tissues. This causes joint redness, soreness, and swelling (inflammation). Repeat attacks are common. Over time, uric acid crystals can form into masses (tophi) near a joint, destroying bone and causing disfigurement. Gout is treatable and often preventable. CAUSES  The disease begins with elevated levels of uric acid in the blood. Uric acid is produced by your body when it breaks down a naturally found substance called purines. Certain foods you eat, such as meats and fish, contain high amounts of purines. Causes of an elevated uric acid level include:  Being passed down from parent to child (heredity).  Diseases that cause increased uric acid production (such as obesity, psoriasis, and certain cancers).  Excessive alcohol use.  Diet, especially diets rich in meat and seafood.  Medicines, including certain cancer-fighting medicines (chemotherapy), water pills (diuretics), and aspirin.  Chronic kidney disease. The kidneys are no longer able to remove uric acid well.  Problems with metabolism. Conditions strongly associated with gout include:  Obesity.  High blood pressure.  High cholesterol.  Diabetes. Not everyone with elevated uric acid levels gets gout. It is not understood why some people get gout and others do not. Surgery, joint injury, and eating too much of certain foods are  some of the factors that can lead to gout attacks. SYMPTOMS   An attack of gout comes on quickly. It causes intense pain with redness, swelling, and warmth in a joint.  Fever can occur.  Often, only one joint is involved. Certain joints are more commonly involved:  Base of the big toe.  Knee.  Ankle.  Wrist.  Finger. Without treatment, an attack usually goes away in a few days to weeks. Between attacks, you usually will not have symptoms, which is different from many other forms of arthritis. DIAGNOSIS  Your caregiver will suspect gout based on your symptoms and exam. In some cases, tests may be recommended. The tests may include:  Blood tests.  Urine tests.  X-rays.  Joint fluid exam. This exam requires a needle to remove fluid from the joint (arthrocentesis). Using a microscope, gout is confirmed when uric acid crystals are seen in the joint fluid. TREATMENT  There are two phases to gout treatment: treating the sudden onset (acute) attack and preventing attacks (prophylaxis).  Treatment of an Acute Attack.  Medicines are used. These include anti-inflammatory medicines or steroid medicines.  An injection of steroid medicine into the affected joint is sometimes necessary.  The painful joint is rested. Movement can worsen the arthritis.  You may use warm or cold treatments on painful joints, depending which works best for you.  Treatment to Prevent Attacks.  If you suffer from frequent gout attacks, your caregiver may advise preventive medicine. These medicines are started after the acute attack subsides. These medicines either help your kidneys eliminate uric  acid from your body or decrease your uric acid production. You may need to stay on these medicines for a very long time.  The early phase of treatment with preventive medicine can be associated with an increase in acute gout attacks. For this reason, during the first few months of treatment, your caregiver may also  advise you to take medicines usually used for acute gout treatment. Be sure you understand your caregiver's directions. Your caregiver may make several adjustments to your medicine dose before these medicines are effective.  Discuss dietary treatment with your caregiver or dietitian. Alcohol and drinks high in sugar and fructose and foods such as meat, poultry, and seafood can increase uric acid levels. Your caregiver or dietitian can advise you on drinks and foods that should be limited. HOME CARE INSTRUCTIONS   Do not take aspirin to relieve pain. This raises uric acid levels.  Only take over-the-counter or prescription medicines for pain, discomfort, or fever as directed by your caregiver.  Rest the joint as much as possible. When in bed, keep sheets and blankets off painful areas.  Keep the affected joint raised (elevated).  Apply warm or cold treatments to painful joints. Use of warm or cold treatments depends on which works best for you.  Use crutches if the painful joint is in your leg.  Drink enough fluids to keep your urine clear or pale yellow. This helps your body get rid of uric acid. Limit alcohol, sugary drinks, and fructose drinks.  Follow your dietary instructions. Pay careful attention to the amount of protein you eat. Your daily diet should emphasize fruits, vegetables, whole grains, and fat-free or low-fat milk products. Discuss the use of coffee, vitamin C, and cherries with your caregiver or dietitian. These may be helpful in lowering uric acid levels.  Maintain a healthy body weight. SEEK MEDICAL CARE IF:   You develop diarrhea, vomiting, or any side effects from medicines.  You do not feel better in 24 hours, or you are getting worse. SEEK IMMEDIATE MEDICAL CARE IF:   Your joint becomes suddenly more tender, and you have chills or a fever. MAKE SURE YOU:   Understand these instructions.  Will watch your condition.  Will get help right away if you are not  doing well or get worse. Document Released: 01/07/2000 Document Revised: 05/26/2013 Document Reviewed: 08/23/2011 [facility] Patient Information 2015 [place], [place]. This information is not intended to replace advice given to you by your health care provider. Make sure you discuss any questions you have with your health care provider.

## 2014-08-27 NOTE — Progress Notes (Signed)
Patient ID: Yolanda Espinoza, female   DOB: 1955/06/16, 59 y.o.   MRN: [id number]  Assessment and Plan:   1. Gout of foot, unspecified cause, unspecified chronicity, unspecified laterality -uloric instead of allopurinol - predniSONE (DELTASONE) 20 MG tablet; 1 tablet daily for 5 days with food.  Dispense: 10 tablet; Refill: 0 - Uric acid -think some of pain is related to ankle swelling which may be due more to venous insufficiency -increase to 80 mg of lasix  2. Medication management  - BASIC METABOLIC PANEL WITH GFR    HPI 59 y.o.female presents for 1 month follow up of gout.  She reports that the she took the prednisone and it went away for maybe a couple days and then came back.  She reports that it is still painful especially at night.  She has not changed her diet at all.  She reports that she has been doing both the allopurinol and the colchris daily. Patient reports that they have been doing okay.  female is taking their medication.  They are not having difficulty with their medications.  They report no adverse reactions.  She reports that she is taking the medication at nightime and has no side effects.    Past Medical History  Diagnosis Date  . Subarachnoid hemorrhage due to ruptured aneurysm     2003 - s/p coiling  . Hypertension may, 1973  . Mitral regurgitation     a. Echo 01/2012 mild LVH, EF 55-65%, Gr 2 DD, mod MR, mild LAE, PASP 36;  b. Echo (02/2013):  EF 55-60%, Gr 1 DD, mild to mod MR, mild LAE (no sig change since prior echo)  . Chronic diastolic heart failure   . Hyperlipidemia   . Unspecified vitamin D deficiency   . Heart murmur   . Stroke 07    no weakness  . Peripheral vascular disease     cerebral aneursym  . OSA on CPAP     7 yrs  . GERD (gastroesophageal reflux disease)   . Arthritis      Allergies  Allergen Reactions  . Aspirin Nausea And Vomiting  . Ace Inhibitors Nausea And Vomiting      Current Outpatient Prescriptions on File Prior to Visit   Medication Sig Dispense Refill  . allopurinol (ZYLOPRIM) 300 MG tablet Take 1 tablet (300 mg total) by mouth daily. 30 tablet 2  . amLODipine-valsartan (EXFORGE) 10-320 MG per tablet TAKE ONE TABLET BY MOUTH ONCE DAILY 90 tablet 3  . aspirin 325 MG tablet Take 325 mg by mouth daily.    . colchicine 0.6 MG tablet Take 1 tablet (0.6 mg total) by mouth daily. 30 tablet 2  . furosemide (LASIX) 40 MG tablet TAKE ONE TABLET BY MOUTH ONCE DAILY 90 tablet 0  . metoprolol succinate (TOPROL-XL) 25 MG 24 hr tablet TAKE ONE TABLET BY MOUTH ONCE DAILY 30 tablet 11  . Multiple Vitamins-Minerals (MULTIVITAMIN WITH MINERALS) tablet Take 1 tablet by mouth daily.    . Omega-3 Fatty Acids (FISH OIL) 1200 MG CAPS Take 2,400 mg by mouth daily.     . pantoprazole (PROTONIX) 40 MG tablet Take 1 tablet (40 mg total) by mouth daily.    . potassium chloride (K-DUR) 10 MEQ tablet TAKE ONE TABLET BY MOUTH ONCE DAILY 90 tablet 1  . predniSONE (DELTASONE) 20 MG tablet 1 tablet daily for 5 days with food. 10 tablet 0  . PREMARIN 0.9 MG tablet TAKE ONE TABLET BY MOUTH DAILY 30 tablet 2  .  vitamin B-12 (CYANOCOBALAMIN) 1000 MCG tablet Take 1,000 mcg by mouth daily.     No current facility-administered medications on file prior to visit.    ROS: Patient reports joint swelling joint pain, no rash, redness, fever, chills, nausea, vomiting.  All other negative except above.   Physical Exam: Filed Weights   08/27/14 1510  Weight: 226 lb (102.513 kg)   BP 158/80 mmHg  Pulse 62  Temp(Src) 98 F (36.7 C) (Temporal)  Resp 18  Ht 5\' 10"  (1.778 m)  Wt 226 lb (102.513 kg)  BMI 32.43 kg/m2 General Appearance: Well developed well nourished, non-toxic appearing in no apparent distress. Eyes: PERRLA, EOMs, conjunctiva w/ no swelling or erythema or discharge Sinuses: No Frontal/maxillary tenderness ENT/Mouth: Ear canals clear without swelling or erythema.  TM's normal bilaterally with no retractions, bulging, or loss of  landmarks.   Neck: Supple, thyroid normal, no notable JVD  Respiratory: Respiratory effort normal, Clear breath sounds anteriorly and posteriorly bilaterally without rales, rhonchi, wheezing or stridor. No retractions or accessory muscle usage. Cardio: RRR with no MRGs.  2+ pretibial edema Abdomen: Soft, + BS.  Non tender, no guarding, rebound, hernias, masses.  Musculoskeletal: Full ROM, 5/5 strength, normal gait. Bilateral ankle swelling and tenderness to palpation Skin: Warm, dry without rashes  Neuro: Awake and oriented X 3, Cranial nerves intact. Normal muscle tone, no cerebellar symptoms. Sensation intact.  Psych: normal affect, Insight and Judgment appropriate.     Starlyn Skeans, PA-C 3:51 PM [facility]

## 2014-08-28 LAB — BASIC METABOLIC PANEL WITH GFR
BUN: 13 mg/dL (ref 7–25)
CALCIUM: 9.5 mg/dL (ref 8.6–10.4)
CHLORIDE: 100 mmol/L (ref 98–110)
CO2: 28 mmol/L (ref 20–31)
CREATININE: 0.92 mg/dL (ref 0.50–1.05)
GFR, EST AFRICAN AMERICAN: 79 mL/min (ref >=60)
GFR, EST NON AFRICAN AMERICAN: 69 mL/min (ref >=60)
GLUCOSE: 68 mg/dL (ref 65–99)
POTASSIUM: 3.9 mmol/L (ref 3.5–5.3)
Sodium: 138 mmol/L (ref 135–146)

## 2014-08-28 LAB — URIC ACID: Uric Acid, Serum: 4.9 mg/dL (ref 2.4–7.0)

## 2014-09-01 ENCOUNTER — Ambulatory Visit (HOSPITAL_COMMUNITY)
Admission: RE | Admit: 2014-09-01 | Discharge: 2014-09-01 | Disposition: A | Discharge status: Home / Self Care | Payer: BLUE CROSS/BLUE SHIELD | Source: Ambulatory Visit | Attending: Interventional Radiology | Admitting: Interventional Radiology

## 2014-09-01 ENCOUNTER — Ambulatory Visit (HOSPITAL_COMMUNITY): Admission: RE | Admit: 2014-09-01 | Payer: BLUE CROSS/BLUE SHIELD | Source: Ambulatory Visit

## 2014-09-01 DIAGNOSIS — Z9889 Other specified postprocedural states: Secondary | ICD-10-CM | POA: Insufficient documentation

## 2014-09-01 DIAGNOSIS — I671 Cerebral aneurysm, nonruptured: Secondary | ICD-10-CM | POA: Insufficient documentation

## 2014-09-01 MED ORDER — GADOBENATE DIMEGLUMINE 529 MG/ML IV SOLN
20.0000 mL | Freq: Once | INTRAVENOUS | Status: AC | PRN
  Administered 2014-09-01: 20 mL via INTRAVENOUS

## 2014-09-03 ENCOUNTER — Encounter: Payer: Self-pay | Admitting: Emergency Medicine

## 2014-10-23 ENCOUNTER — Ambulatory Visit (INDEPENDENT_AMBULATORY_CARE_PROVIDER_SITE_OTHER): Payer: BLUE CROSS/BLUE SHIELD | Admitting: Physician Assistant

## 2014-10-23 ENCOUNTER — Encounter: Payer: Self-pay | Admitting: Physician Assistant

## 2014-10-23 VITALS — BP 120/80 | HR 77 | Temp 98.1°F | Resp 16 | Ht 71.0 in | Wt 223.0 lb

## 2014-10-23 DIAGNOSIS — Z0000 Encounter for general adult medical examination without abnormal findings: Secondary | ICD-10-CM

## 2014-10-23 DIAGNOSIS — Z23 Encounter for immunization: Secondary | ICD-10-CM

## 2014-10-23 DIAGNOSIS — E785 Hyperlipidemia, unspecified: Secondary | ICD-10-CM

## 2014-10-23 DIAGNOSIS — E669 Obesity, unspecified: Secondary | ICD-10-CM

## 2014-10-23 DIAGNOSIS — G4733 Obstructive sleep apnea (adult) (pediatric): Secondary | ICD-10-CM

## 2014-10-23 DIAGNOSIS — M10079 Idiopathic gout, unspecified ankle and foot: Secondary | ICD-10-CM | POA: Diagnosis not present

## 2014-10-23 DIAGNOSIS — I340 Nonrheumatic mitral (valve) insufficiency: Secondary | ICD-10-CM

## 2014-10-23 DIAGNOSIS — M109 Gout, unspecified: Secondary | ICD-10-CM | POA: Insufficient documentation

## 2014-10-23 DIAGNOSIS — R7303 Prediabetes: Secondary | ICD-10-CM

## 2014-10-23 DIAGNOSIS — Z9989 Dependence on other enabling machines and devices: Secondary | ICD-10-CM

## 2014-10-23 DIAGNOSIS — E559 Vitamin D deficiency, unspecified: Secondary | ICD-10-CM | POA: Diagnosis not present

## 2014-10-23 DIAGNOSIS — I10 Essential (primary) hypertension: Secondary | ICD-10-CM

## 2014-10-23 DIAGNOSIS — D649 Anemia, unspecified: Secondary | ICD-10-CM

## 2014-10-23 DIAGNOSIS — I5032 Chronic diastolic (congestive) heart failure: Secondary | ICD-10-CM

## 2014-10-23 DIAGNOSIS — I671 Cerebral aneurysm, nonruptured: Secondary | ICD-10-CM

## 2014-10-23 DIAGNOSIS — Z79899 Other long term (current) drug therapy: Secondary | ICD-10-CM

## 2014-10-23 DIAGNOSIS — R809 Proteinuria, unspecified: Secondary | ICD-10-CM

## 2014-10-23 DIAGNOSIS — Z0001 Encounter for general adult medical examination with abnormal findings: Secondary | ICD-10-CM

## 2014-10-23 LAB — LIPID PANEL
CHOL/HDL RATIO: 1.6 ratio (ref <=5.0)
Cholesterol: 190 mg/dL (ref 125–200)
HDL: 117 mg/dL (ref >=46)
LDL CALC: 52 mg/dL (ref <130)
Triglycerides: 106 mg/dL (ref <150)
VLDL: 21 mg/dL (ref <30)

## 2014-10-23 LAB — CBC WITH DIFFERENTIAL/PLATELET
Basophils Absolute: 0.1 10*3/uL (ref 0.0–0.1)
Basophils Relative: 1 % (ref 0–1)
EOS ABS: 0.3 10*3/uL (ref 0.0–0.7)
Eosinophils Relative: 5 % (ref 0–5)
HCT: 32.8 % — ABNORMAL LOW (ref 36.0–46.0)
Hemoglobin: 10.8 g/dL — ABNORMAL LOW (ref 12.0–15.0)
LYMPHS ABS: 3.1 10*3/uL (ref 0.7–4.0)
LYMPHS PCT: 51 % — AB (ref 12–46)
MCH: 27.3 pg (ref 26.0–34.0)
MCHC: 32.9 g/dL (ref 30.0–36.0)
MCV: 82.8 fL (ref 78.0–100.0)
MONOS PCT: 6 % (ref 3–12)
MPV: 9.4 fL (ref 8.6–12.4)
Monocytes Absolute: 0.4 10*3/uL (ref 0.1–1.0)
NEUTROS PCT: 37 % — AB (ref 43–77)
Neutro Abs: 2.3 10*3/uL (ref 1.7–7.7)
PLATELETS: 275 10*3/uL (ref 150–400)
RBC: 3.96 MIL/uL (ref 3.87–5.11)
RDW: 16.4 % — ABNORMAL HIGH (ref 11.5–15.5)
WBC: 6.1 10*3/uL (ref 4.0–10.5)

## 2014-10-23 LAB — BASIC METABOLIC PANEL WITH GFR
BUN: 12 mg/dL (ref 7–25)
CO2: 28 mmol/L (ref 20–31)
CREATININE: 0.93 mg/dL (ref 0.50–1.05)
Calcium: 9.3 mg/dL (ref 8.6–10.4)
Chloride: 99 mmol/L (ref 98–110)
GFR, Est African American: 78 mL/min (ref >=60)
GFR, Est Non African American: 67 mL/min (ref >=60)
Glucose, Bld: 81 mg/dL (ref 65–99)
POTASSIUM: 3.9 mmol/L (ref 3.5–5.3)
Sodium: 136 mmol/L (ref 135–146)

## 2014-10-23 LAB — FERRITIN: FERRITIN: 69 ng/mL (ref 10–291)

## 2014-10-23 LAB — IRON AND TIBC
%SAT: 17 % (ref 11–50)
Iron: 59 ug/dL (ref 45–160)
TIBC: 352 ug/dL (ref 250–450)
UIBC: 293 ug/dL (ref 125–400)

## 2014-10-23 LAB — HEPATIC FUNCTION PANEL
ALBUMIN: 4.1 g/dL (ref 3.6–5.1)
ALK PHOS: 53 U/L (ref 33–130)
ALT: 8 U/L (ref 6–29)
AST: 18 U/L (ref 10–35)
BILIRUBIN DIRECT: 0.1 mg/dL (ref <=0.2)
BILIRUBIN TOTAL: 0.3 mg/dL (ref 0.2–1.2)
Indirect Bilirubin: 0.2 mg/dL (ref 0.2–1.2)
Total Protein: 7.2 g/dL (ref 6.1–8.1)

## 2014-10-23 LAB — MAGNESIUM: Magnesium: 1.4 mg/dL — ABNORMAL LOW (ref 1.5–2.5)

## 2014-10-23 LAB — TSH: TSH: 2.216 u[IU]/mL (ref 0.350–4.500)

## 2014-10-23 LAB — URIC ACID: URIC ACID, SERUM: 4.2 mg/dL (ref 2.4–7.0)

## 2014-10-23 LAB — VITAMIN B12

## 2014-10-23 NOTE — Patient Instructions (Signed)
Preventive Care for Adults  A healthy lifestyle and preventive care can promote health and wellness. Preventive health guidelines for women include the following key practices.  A routine yearly physical is a good way to check with your health care Kaleo Condrey about your health and preventive screening. It is a chance to share any concerns and updates on your health and to receive a thorough exam.  Visit your dentist for a routine exam and preventive care every 6 months. Brush your teeth twice a day and floss once a day. Good oral hygiene prevents tooth decay and gum disease.  The frequency of eye exams is based on your age, health, family medical history, use of contact lenses, and other factors. Follow your health care Bethlehem Langstaff's recommendations for frequency of eye exams.  Eat a healthy diet. Foods like vegetables, fruits, whole grains, low-fat dairy products, and lean protein foods contain the nutrients you need without too many calories. Decrease your intake of foods high in solid fats, added sugars, and salt. Eat the right amount of calories for you.Get information about a proper diet from your health care Jibril Mcminn, if necessary.  Regular physical exercise is one of the most important things you can do for your health. Most adults should get at least 150 minutes of moderate-intensity exercise (any activity that increases your heart rate and causes you to sweat) each week. In addition, most adults need muscle-strengthening exercises on 2 or more days a week.  Maintain a healthy weight. The body mass index (BMI) is a screening tool to identify possible weight problems. It provides an estimate of body fat based on height and weight. Your health care Dajahnae Vondra can find your BMI and can help you achieve or maintain a healthy weight.For adults 20 years and older:  A BMI below 18.5 is considered underweight.  A BMI of 18.5 to 24.9 is normal.  A BMI of 25 to 29.9 is considered overweight.  A BMI of  30 and above is considered obese.  Maintain normal blood lipids and cholesterol levels by exercising and minimizing your intake of saturated fat. Eat a balanced diet with plenty of fruit and vegetables. Blood tests for lipids and cholesterol should begin at age 20 and be repeated every 5 years. If your lipid or cholesterol levels are high, you are over 50, or you are at high risk for heart disease, you may need your cholesterol levels checked more frequently.Ongoing high lipid and cholesterol levels should be treated with medicines if diet and exercise are not working.  If you smoke, find out from your health care Enmanuel Zufall how to quit. If you do not use tobacco, do not start.  Lung cancer screening is recommended for adults aged 55-80 years who are at high risk for developing lung cancer because of a history of smoking. A yearly low-dose CT scan of the lungs is recommended for people who have at least a 30-pack-year history of smoking and are a current smoker or have quit within the past 15 years. A pack year of smoking is smoking an average of 1 pack of cigarettes a day for 1 year (for example: 1 pack a day for 30 years or 2 packs a day for 15 years). Yearly screening should continue until the smoker has stopped smoking for at least 15 years. Yearly screening should be stopped for people who develop a health problem that would prevent them from having lung cancer treatment.  High blood pressure causes heart disease and increases the risk of   stroke. Your blood pressure should be checked at least every 1 to 2 years. Ongoing high blood pressure should be treated with medicines if weight loss and exercise do not work.  If you are 55-79 years old, ask your health care Samwise Eckardt if you should take aspirin to prevent strokes.  Diabetes screening involves taking a blood sample to check your fasting blood sugar level. This should be done once every 3 years, after age 45, if you are within normal weight and  without risk factors for diabetes. Testing should be considered at a younger age or be carried out more frequently if you are overweight and have at least 1 risk factor for diabetes.  Breast cancer screening is essential preventive care for women. You should practice "breast self-awareness." This means understanding the normal appearance and feel of your breasts and may include breast self-examination. Any changes detected, no matter how small, should be reported to a health care Khale Nigh. Women in their 20s and 30s should have a clinical breast exam (CBE) by a health care Terril Chestnut as part of a regular health exam every 1 to 3 years. After age 40, women should have a CBE every year. Starting at age 40, women should consider having a mammogram (breast X-ray test) every year. Women who have a family history of breast cancer should talk to their health care Edwar Coe about genetic screening. Women at a high risk of breast cancer should talk to their health care providers about having an MRI and a mammogram every year.  Breast cancer gene (BRCA)-related cancer risk assessment is recommended for women who have family members with BRCA-related cancers. BRCA-related cancers include breast, ovarian, tubal, and peritoneal cancers. Having family members with these cancers may be associated with an increased risk for harmful changes (mutations) in the breast cancer genes BRCA1 and BRCA2. Results of the assessment will determine the need for genetic counseling and BRCA1 and BRCA2 testing.  Routine pelvic exams to screen for cancer are no longer recommended for nonpregnant women who are considered low risk for cancer of the pelvic organs (ovaries, uterus, and vagina) and who do not have symptoms. Ask your health care Shelden Raborn if a screening pelvic exam is right for you.  If you have had past treatment for cervical cancer or a condition that could lead to cancer, you need Pap tests and screening for cancer for at least 20  years after your treatment. If Pap tests have been discontinued, your risk factors (such as having a new sexual partner) need to be reassessed to determine if screening should be resumed. Some women have medical problems that increase the chance of getting cervical cancer. In these cases, your health care Kathrynn Backstrom may recommend more frequent screening and Pap tests.  Colorectal cancer can be detected and often prevented. Most routine colorectal cancer screening begins at the age of 50 years and continues through age 75 years. However, your health care Eleana Tocco may recommend screening at an earlier age if you have risk factors for colon cancer. On a yearly basis, your health care Ahren Pettinger may provide home test kits to check for hidden blood in the stool. Use of a small camera at the end of a tube, to directly examine the colon (sigmoidoscopy or colonoscopy), can detect the earliest forms of colorectal cancer. Talk to your health care Thaine Garriga about this at age 50, when routine screening begins. Direct exam of the colon should be repeated every 5-10 years through age 75 years, unless early forms of pre-cancerous   polyps or small growths are found.  Hepatitis C blood testing is recommended for all people born from 1945 through 1965 and any individual with known risks for hepatitis C.  Pra  Osteoporosis is a disease in which the bones lose minerals and strength with aging. This can result in serious bone fractures or breaks. The risk of osteoporosis can be identified using a bone density scan. Women ages 65 years and over and women at risk for fractures or osteoporosis should discuss screening with their health care providers. Ask your health care Jyssica Rief whether you should take a calcium supplement or vitamin D to reduce the rate of osteoporosis.  Menopause can be associated with physical symptoms and risks. Hormone replacement therapy is available to decrease symptoms and risks. You should talk to your  health care Genavive Kubicki about whether hormone replacement therapy is right for you.  Use sunscreen. Apply sunscreen liberally and repeatedly throughout the day. You should seek shade when your shadow is shorter than you. Protect yourself by wearing long sleeves, pants, a wide-brimmed hat, and sunglasses year round, whenever you are outdoors.  Once a month, do a whole body skin exam, using a mirror to look at the skin on your back. Tell your health care Emersynn Deatley of new moles, moles that have irregular borders, moles that are larger than a pencil eraser, or moles that have changed in shape or color.  Stay current with required vaccines (immunizations).  Influenza vaccine. All adults should be immunized every year.  Tetanus, diphtheria, and acellular pertussis (Td, Tdap) vaccine. Pregnant women should receive 1 dose of Tdap vaccine during each pregnancy. The dose should be obtained regardless of the length of time since the last dose. Immunization is preferred during the 27th-36th week of gestation. An adult who has not previously received Tdap or who does not know her vaccine status should receive 1 dose of Tdap. This initial dose should be followed by tetanus and diphtheria toxoids (Td) booster doses every 10 years. Adults with an unknown or incomplete history of completing a 3-dose immunization series with Td-containing vaccines should begin or complete a primary immunization series including a Tdap dose. Adults should receive a Td booster every 10 years.  Varicella vaccine. An adult without evidence of immunity to varicella should receive 2 doses or a second dose if she has previously received 1 dose. Pregnant females who do not have evidence of immunity should receive the first dose after pregnancy. This first dose should be obtained before leaving the health care facility. The second dose should be obtained 4-8 weeks after the first dose.  Human papillomavirus (HPV) vaccine. Females aged 13-26 years  who have not received the vaccine previously should obtain the 3-dose series. The vaccine is not recommended for use in pregnant females. However, pregnancy testing is not needed before receiving a dose. If a female is found to be pregnant after receiving a dose, no treatment is needed. In that case, the remaining doses should be delayed until after the pregnancy. Immunization is recommended for any person with an immunocompromised condition through the age of 26 years if she did not get any or all doses earlier. During the 3-dose series, the second dose should be obtained 4-8 weeks after the first dose. The third dose should be obtained 24 weeks after the first dose and 16 weeks after the second dose.  Zoster vaccine. One dose is recommended for adults aged 60 years or older unless certain conditions are present.  Measles, mumps, and rubella (  MMR) vaccine. Adults born before [birth year] generally are considered immune to measles and mumps. Adults born in [birth year] or later should have 1 or more doses of MMR vaccine unless there is a contraindication to the vaccine or there is laboratory evidence of immunity to each of the three diseases. A routine second dose of MMR vaccine should be obtained at least 28 days after the first dose for students attending postsecondary schools, health care workers, or international travelers. People who received inactivated measles vaccine or an unknown type of measles vaccine during 1963-1967 should receive 2 doses of MMR vaccine. People who received inactivated mumps vaccine or an unknown type of mumps vaccine before 1979 and are at high risk for mumps infection should consider immunization with 2 doses of MMR vaccine. For females of childbearing age, rubella immunity should be determined. If there is no evidence of immunity, females who are not pregnant should be vaccinated. If there is no evidence of immunity, females who are pregnant should delay immunization until after pregnancy.  Unvaccinated health care workers born before [birth year] who lack laboratory evidence of measles, mumps, or rubella immunity or laboratory confirmation of disease should consider measles and mumps immunization with 2 doses of MMR vaccine or rubella immunization with 1 dose of MMR vaccine.  Pneumococcal 13-valent conjugate (PCV13) vaccine. When indicated, a person who is uncertain of her immunization history and has no record of immunization should receive the PCV13 vaccine. An adult aged 39 years or older who has certain medical conditions and has not been previously immunized should receive 1 dose of PCV13 vaccine. This PCV13 should be followed with a dose of pneumococcal polysaccharide (PPSV23) vaccine. The PPSV23 vaccine dose should be obtained at least 8 weeks after the dose of PCV13 vaccine. An adult aged 62 years or older who has certain medical conditions and previously received 1 or more doses of PPSV23 vaccine should receive 1 dose of PCV13. The PCV13 vaccine dose should be obtained 1 or more years after the last PPSV23 vaccine dose.    Pneumococcal polysaccharide (PPSV23) vaccine. When PCV13 is also indicated, PCV13 should be obtained first. All adults aged 67 years and older should be immunized. An adult younger than age 45 years who has certain medical conditions should be immunized. Any person who resides in a nursing home or long-term care facility should be immunized. An adult smoker should be immunized. People with an immunocompromised condition and certain other conditions should receive both PCV13 and PPSV23 vaccines. People with human immunodeficiency virus (HIV) infection should be immunized as soon as possible after diagnosis. Immunization during chemotherapy or radiation therapy should be avoided. Routine use of PPSV23 vaccine is not recommended for American Indians, [place] Natives, or people younger than 65 years unless there are medical conditions that require PPSV23 vaccine. When indicated,  people who have unknown immunization and have no record of immunization should receive PPSV23 vaccine. One-time revaccination 5 years after the first dose of PPSV23 is recommended for people aged 19-64 years who have chronic kidney failure, nephrotic syndrome, asplenia, or immunocompromised conditions. People who received 1-2 doses of PPSV23 before age 23 years should receive another dose of PPSV23 vaccine at age 35 years or later if at least 5 years have passed since the previous dose. Doses of PPSV23 are not needed for people immunized with PPSV23 at or after age 38 years.  Preventive Services / Frequency   Ages 43 to 86 years  Blood pressure check.  Lipid and cholesterol check.  Lung  cancer screening. / Every year if you are aged 74-80 years and have a 30-pack-year history of smoking and currently smoke or have quit within the past 15 years. Yearly screening is stopped once you have quit smoking for at least 15 years or develop a health problem that would prevent you from having lung cancer treatment.  Clinical breast exam.** / Every year after age 45 years.  BRCA-related cancer risk assessment.** / For women who have family members with a BRCA-related cancer (breast, ovarian, tubal, or peritoneal cancers).  Mammogram.** / Every year beginning at age 23 years and continuing for as long as you are in good health. Consult with your health care Shardea Cwynar.  Pap test.** / Every 3 years starting at age 67 years through age 77 or 45 years with a history of 3 consecutive normal Pap tests.  HPV screening.** / Every 3 years from ages 71 years through ages 48 to 36 years with a history of 3 consecutive normal Pap tests.  Fecal occult blood test (FOBT) of stool. / Every year beginning at age 70 years and continuing until age 66 years. You may not need to do this test if you get a colonoscopy every 10 years.  Flexible sigmoidoscopy or colonoscopy.** / Every 5 years for a flexible sigmoidoscopy or  every 10 years for a colonoscopy beginning at age 63 years and continuing until age 21 years.  Hepatitis C blood test.** / For all people born from [birth year] through 1965 and any individual with known risks for hepatitis C.  Skin self-exam. / Monthly.  Influenza vaccine. / Every year.  Tetanus, diphtheria, and acellular pertussis (Tdap/Td) vaccine.** / Consult your health care Davidjames Blansett. Pregnant women should receive 1 dose of Tdap vaccine during each pregnancy. 1 dose of Td every 10 years.  Varicella vaccine.** / Consult your health care Akela Pocius. Pregnant females who do not have evidence of immunity should receive the first dose after pregnancy.  Zoster vaccine.** / 1 dose for adults aged 25 years or older.  Pneumococcal 13-valent conjugate (PCV13) vaccine.** / Consult your health care Langley Ingalls.  Pneumococcal polysaccharide (PPSV23) vaccine.** / 1 to 2 doses if you smoke cigarettes or if you have certain conditions.  Meningococcal vaccine.** / Consult your health care Oviya Ammar.  Hepatitis A vaccine.** / Consult your health care Anissia Wessells.  Hepatitis B vaccine.** / Consult your health care Damoney Julia. Screening for abdominal aortic aneurysm (AAA)  by ultrasound is recommended for people over 50 who have history of high blood pressure or who are current or former smokers.

## 2014-10-23 NOTE — Progress Notes (Signed)
Complete Physical  Assessment and Plan: 1. Essential hypertension - continue medications, DASH diet, exercise and monitor at home. Call if greater than 130/80.  - CBC with Differential/Platelet - BASIC METABOLIC PANEL WITH GFR - Hepatic function panel - TSH - Urinalysis, Routine w reflex microscopic (not at [facility]) - Microalbumin / creatinine urine ratio  2. Prediabetes Discussed general issues about diabetes pathophysiology and management., Educational material distributed., Suggested low cholesterol diet., Encouraged aerobic exercise., Discussed foot care., Reminded to get yearly retinal exam. - Hemoglobin A1c - Insulin, fasting  3. Hyperlipidemia -continue medications, check lipids, decrease fatty foods, increase activity. - Lipid panel  4. Obesity Obesity with co morbidities- long discussion about weight loss, diet, and exercise  5. Chronic diastolic heart failure Weight down, BP stable.   6. Mitral regurgitation Monitored by cardio, no SOB/CP  7. Vitamin D deficiency - Vit D  25 hydroxy (rtn osteoporosis monitoring)  8. Medication management - Magnesium  9. OSA on CPAP Sleep apnea- continue CPAP, weight loss advised.   10. Brain aneurysm Control blood pressure, cholesterol, glucose, increase exercise.   11. Gout of foot, unspecified cause, unspecified chronicity, unspecified laterality - Uric acid  12. Encounter for general adult medical examination with abnormal findings - Flu vaccine greater than or equal to 3yo with preservative IM - CBC with Differential/Platelet - BASIC METABOLIC PANEL WITH GFR - Hepatic function panel - TSH - Lipid panel - Hemoglobin A1c - Insulin, fasting - Magnesium - Vit D  25 hydroxy (rtn osteoporosis monitoring) - Urinalysis, Routine w reflex microscopic (not at [facility]) - Iron and TIBC - Ferritin - Vitamin B12 - Uric acid - Microalbumin / creatinine urine ratio  13. Need for prophylactic vaccination and inoculation against  influenza - Flu vaccine greater than or equal to 3yo with preservative IM  14. Anemia, unspecified anemia type - Iron and TIBC - Ferritin - Vitamin B12  Discussed med's effects and SE's. Screening labs and tests as requested with regular follow-up as recommended. Over 40 minutes of exam, counseling, chart review, and complex, high level critical decision making was performed this visit.  Future Appointments Date Time Provider [place]  10/26/2015 9:00 AM Vicie Mutters, PA-C [id number] None    HPI  59 y.o. female  presents for a complete physical.  Her blood pressure has been controlled at home, today their BP is BP: 120/80 mmHg She does workout. She denies chest pain, shortness of breath, dizziness.  She has history of CVA due to aneurysm, PVD and MR, follows with Dr. Sondra Come. She is on 325 ASA. Had stable MRA brain in august of this year.  She works at [employer].  She is not on cholesterol medication and denies myalgias. Her cholesterol is at goal. The cholesterol last visit was:   Lab Results  Component Value Date   CHOL 167 07/15/2014   HDL 107 07/15/2014   LDLCALC 37 07/15/2014   TRIG 113 07/15/2014   CHOLHDL 1.6 07/15/2014   She has been working on diet and exercise for prediabetes, and denies paresthesia of the feet, polydipsia, polyuria and visual disturbances. 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  She has OSA and is on CPAP, she is on protonix for GERD.  Has history of gout.  BMI is Body mass index is 31.12 kg/(m^2)., she is working on diet and exercise. Wt Readings from  Last 3 Encounters:  10/23/14 223 lb (101.152 kg)  08/27/14 226 lb (102.513 kg)  07/15/14 225 lb (102.059 kg)   Patient is on Vitamin D supplement.   Lab Results  Component Value Date   VD25OH 37 07/15/2014      Current Medications:  Current Outpatient  Prescriptions on File Prior to Visit  Medication Sig Dispense Refill  . amLODipine-valsartan (EXFORGE) 10-320 MG per tablet TAKE ONE TABLET BY MOUTH ONCE DAILY 90 tablet 3  . aspirin 325 MG tablet Take 325 mg by mouth daily.    . colchicine 0.6 MG tablet Take 1 tablet (0.6 mg total) by mouth daily. 30 tablet 2  . cyclobenzaprine (FLEXERIL) 10 MG tablet Take 1 tablet (10 mg total) by mouth at bedtime. 30 tablet 1  . Febuxostat 80 MG TABS Take 1 tablet (80 mg total) by mouth daily. 30 tablet 2  . furosemide (LASIX) 80 MG tablet Take 1 tablet (80 mg total) by mouth daily. 90 tablet 1  . metoprolol succinate (TOPROL-XL) 25 MG 24 hr tablet TAKE ONE TABLET BY MOUTH ONCE DAILY 30 tablet 11  . Multiple Vitamins-Minerals (MULTIVITAMIN WITH MINERALS) tablet Take 1 tablet by mouth daily.    . Omega-3 Fatty Acids (FISH OIL) 1200 MG CAPS Take 2,400 mg by mouth daily.     . pantoprazole (PROTONIX) 40 MG tablet Take 1 tablet (40 mg total) by mouth daily.    . potassium chloride (K-DUR) 10 MEQ tablet TAKE ONE TABLET BY MOUTH ONCE DAILY 90 tablet 1  . predniSONE (DELTASONE) 20 MG tablet 1 tablet daily for 5 days with food. 10 tablet 0  . PREMARIN 0.9 MG tablet TAKE ONE TABLET BY MOUTH ONCE DAILY 30 tablet 3  . vitamin B-12 (CYANOCOBALAMIN) 1000 MCG tablet Take 1,000 mcg by mouth daily.     No current facility-administered medications on file prior to visit.   Health Maintenance:   Immunization History  Administered Date(s) Administered  . Influenza Split 11/11/2013  . Influenza-Unspecified 11/25/2012  . Tdap 08/25/2013   Tetanus: 2015 Pneumovax: N/A Prevnar 13: N/A Flu vaccine: 2015 DUE today Zostavax: N/A LMP: TAH Pap: TAH MGM:01/2014  DEXA: N/A Colonoscopy: 2011, due 10/29/2014 Dr. Collene Mares EGD: N/A CT head 2010 CXR 2015 MRA 08/2014 Renal US 2002 Echo 2015, normal EF, mil-mod AR  Last Dental Exam: Has partial/bridges, no dentist.  Last Eye Exam: [facility], wears glasses, 11/2013, goes  yearly Patient Care Team: Unk Pinto, MD as PCP - General (Internal Medicine) Liliane Shi, PA-C as Physician Assistant (Physician Assistant) Juanita Craver, MD as Consulting Physician (Gastroenterology) Mcarthur Rossetti, MD as Consulting Physician (Orthopedic Surgery) Unk Pinto, MD as Consulting Physician (Internal Medicine) Luanne Bras, MD as Consulting Physician (Interventional Radiology)  Allergies:  Allergies  Allergen Reactions  . Aspirin Nausea And Vomiting  . Ace Inhibitors Nausea And Vomiting   Medical History:  Past Medical History  Diagnosis Date  . Subarachnoid hemorrhage due to ruptured aneurysm     2003 - s/p coiling  . Hypertension may, 1973  . Mitral regurgitation     a. Echo 01/2012 mild LVH, EF 55-65%, Gr 2 DD, mod MR, mild LAE, PASP 36;  b. Echo (02/2013):  EF 55-60%, Gr 1 DD, mild to mod MR, mild LAE (no sig change since prior echo)  . Chronic diastolic heart failure   . Hyperlipidemia   . Unspecified vitamin D deficiency   . Heart murmur   . Stroke 07    no weakness  .  Peripheral vascular disease     cerebral aneursym  . OSA on CPAP     7 yrs  . GERD (gastroesophageal reflux disease)   . Arthritis    Surgical History:  Past Surgical History  Procedure Laterality Date  . Aneurysm coiling    . Head surgery      aneurysm ruptured- stroke  . Abdominal hysterectomy    . Tonsillectomy and adenoidectomy    . Radiology with anesthesia N/A 04/23/2013    Procedure: RADIOLOGY WITH ANESTHESIA;  Surgeon: Rob Hickman, MD;  Location: [facility];  Service: Radiology;  Laterality: N/A;   Family History:  Family History  Problem Relation Age of Onset  . Hypertension Father   . Kidney disease Father   . Hypertension Sister   . Hypertension Sister   . Hypertension Sister    Social History:  Social History  Substance Use Topics  . Smoking status: Former Smoker -- 0.25 packs/day for 3 years    Types: Cigarettes    Quit date: 01/24/1999   . Smokeless tobacco: None  . Alcohol Use: No    Review of Systems: Review of Systems  Constitutional: Negative.   HENT: Negative.   Respiratory: Positive for cough (dry). Negative for hemoptysis, sputum production, shortness of breath and wheezing.   Cardiovascular: Negative.  Negative for chest pain, palpitations and leg swelling (resolved).  Gastrointestinal: Positive for heartburn. Negative for nausea, vomiting, abdominal pain, diarrhea, constipation, blood in stool and melena.  Genitourinary: Negative.   Musculoskeletal: Positive for back pain and joint pain. Negative for myalgias, falls and neck pain.  Skin: Negative.   Neurological: Negative.  Negative for dizziness and headaches.  Psychiatric/Behavioral: Negative.  Negative for depression, suicidal ideas, hallucinations, memory loss and substance abuse. The patient is not nervous/anxious and does not have insomnia.     Physical Exam: Estimated body mass index is 31.12 kg/(m^2) as calculated from the following:   Height as of this encounter: 5\' 11"  (1.803 m).   Weight as of this encounter: 223 lb (101.152 kg). BP 120/80 mmHg  Pulse 77  Temp(Src) 98.1 F (36.7 C) (Temporal)  Resp 16  Ht 5\' 11"  (1.803 m)  Wt 223 lb (101.152 kg)  BMI 31.12 kg/m2  SpO2 93% General Appearance: Well nourished, in no apparent distress.  Eyes: PERRLA, EOMs, conjunctiva no swelling or erythema, normal fundi and vessels.  Sinuses: No Frontal/maxillary tenderness  ENT/Mouth: Ext aud canals clear, normal light reflex with TMs without erythema, bulging. Good dentition. No erythema, swelling, or exudate on post pharynx. Tonsils not swollen or erythematous. Hearing normal.  Neck: Supple, thyroid slightly enlarged. No bruits  Respiratory: Respiratory effort normal, BS equal bilaterally without rales, rhonchi, wheezing or stridor.  Cardio: RRR with systolic murmur, without rubs or gallops. Brisk peripheral pulses without edema.  Chest: symmetric, with  normal excursions and percussion.  Breasts: declines  Abdomen: Soft, nontender, no guarding, rebound, hernias, masses, or organomegaly.  Lymphatics: Non tender without lymphadenopathy.  Genitourinary: defer Musculoskeletal: Full ROM all peripheral extremities,5/5 strength, and normal gait.  Skin: Warm, dry without rashes, lesions, ecchymosis. Neuro: Cranial nerves intact, reflexes equal bilaterally. Normal muscle tone, no cerebellar symptoms. Sensation intact.  Psych: Awake and oriented X 3, normal affect, Insight and Judgment appropriate.   EKG: defer. AORTA SCAN: defer  Vicie Mutters 9:35 AM [facility]

## 2014-10-24 LAB — HEMOGLOBIN A1C
HEMOGLOBIN A1C: 5.6 % (ref <5.7)
Mean Plasma Glucose: 114 mg/dL (ref <117)

## 2014-10-24 LAB — URINALYSIS, MICROSCOPIC ONLY
Bacteria, UA: NONE SEEN [HPF]
Casts: NONE SEEN [LPF]
Crystals: NONE SEEN [HPF]
RBC / HPF: NONE SEEN RBC/HPF (ref <=2)
YEAST: NONE SEEN [HPF]

## 2014-10-24 LAB — URINALYSIS, ROUTINE W REFLEX MICROSCOPIC
Bilirubin Urine: NEGATIVE
GLUCOSE, UA: NEGATIVE
Hgb urine dipstick: NEGATIVE
Ketones, ur: NEGATIVE
LEUKOCYTES UA: NEGATIVE
Nitrite: NEGATIVE
PH: 5.5 (ref 5.0–8.0)
SPECIFIC GRAVITY, URINE: 1.013 (ref 1.001–1.035)

## 2014-10-24 LAB — MICROALBUMIN / CREATININE URINE RATIO
Creatinine, Urine: 145.4 mg/dL
Microalb Creat Ratio: 171.3 mg/g — ABNORMAL HIGH (ref 0.0–30.0)
Microalb, Ur: 24.9 mg/dL — ABNORMAL HIGH (ref <2.0)

## 2014-10-24 LAB — VITAMIN D 25 HYDROXY (VIT D DEFICIENCY, FRACTURES): Vit D, 25-Hydroxy: 46 ng/mL (ref 30–100)

## 2014-10-24 LAB — INSULIN, FASTING: Insulin fasting, serum: 7.6 u[IU]/mL (ref 2.0–19.6)

## 2014-10-26 NOTE — Addendum Note (Signed)
Addended by: Vicie Mutters R on: 10/26/2014 09:14 AM   Modules accepted: Orders, SmartSet

## 2015-01-07 ENCOUNTER — Other Ambulatory Visit: Payer: Self-pay | Admitting: Internal Medicine

## 2015-01-12 ENCOUNTER — Other Ambulatory Visit: Payer: Self-pay | Admitting: Internal Medicine

## 2015-02-10 ENCOUNTER — Other Ambulatory Visit: Payer: Self-pay | Admitting: Internal Medicine

## 2015-02-13 ENCOUNTER — Encounter: Payer: Self-pay | Admitting: *Deleted

## 2015-03-11 ENCOUNTER — Encounter: Payer: Self-pay | Admitting: Cardiovascular Disease

## 2015-03-11 ENCOUNTER — Ambulatory Visit (INDEPENDENT_AMBULATORY_CARE_PROVIDER_SITE_OTHER): Payer: BLUE CROSS/BLUE SHIELD | Admitting: Cardiovascular Disease

## 2015-03-11 VITALS — BP 150/102 | HR 66 | Ht 71.0 in | Wt 218.8 lb

## 2015-03-11 DIAGNOSIS — I340 Nonrheumatic mitral (valve) insufficiency: Secondary | ICD-10-CM | POA: Diagnosis not present

## 2015-03-11 DIAGNOSIS — I10 Essential (primary) hypertension: Secondary | ICD-10-CM

## 2015-03-11 LAB — BASIC METABOLIC PANEL
BUN: 9 mg/dL (ref 7–25)
CALCIUM: 9.2 mg/dL (ref 8.6–10.4)
CHLORIDE: 104 mmol/L (ref 98–110)
CO2: 29 mmol/L (ref 20–31)
Creat: 0.93 mg/dL (ref 0.50–1.05)
GLUCOSE: 77 mg/dL (ref 65–99)
POTASSIUM: 3.7 mmol/L (ref 3.5–5.3)
SODIUM: 139 mmol/L (ref 135–146)

## 2015-03-11 LAB — MAGNESIUM: Magnesium: 1.5 mg/dL (ref 1.5–2.5)

## 2015-03-11 MED ORDER — ASPIRIN EC 81 MG PO TBEC: 81.0000 mg | DELAYED_RELEASE_TABLET | Freq: Every day | ORAL | Status: AC

## 2015-03-11 NOTE — Progress Notes (Signed)
Cardiology Office Note   Date:  03/11/2015   ID:  Yolanda Espinoza, DOB 02-Mar-1955, MRN [id number]  PCP:  Alesia Richards, MD  Cardiologist:   Thayer Headings, MD   Chief Complaint  Patient presents with  . Hypertension   1. hypertension 2. Heart murmur 3. Cerebral aneurysm  History of Present Illness:  Yolanda Espinoza is a 60 year old female with a history of hypertension and a cerebral aneurysm. She was to have a heart murmur and was referred here for further evaluation. She also has been having some shortness of breath. She exercises for 30 minutes every day typically does not have much shortness breath when she is exercising. She seems to have shortness of breath at other times. She denies any syncope or presyncope. She denies any chest pain.  She works at [employer]. She does also helps the seniors with activities.  Hx of blood pressure on occasion. Her blood pressure readings have been normal.   Feb. 17, 2016:   Yolanda Espinoza is a 60 y.o. female who presents for follow up of her HTN She is dong well.  She has had the cerebral coils removed and had another procedure. Headaches have resolved. BP is well controlled.  Getting some exercise.  Still working at [employer].  Feb. 16, 2017: Doing well.  BP has been well controlled.   Has lost some weight.   Exercising    Past Medical History  Diagnosis Date  . Subarachnoid hemorrhage due to ruptured aneurysm ([place])     2003 - s/p coiling  . Hypertension may, 1973  . Mitral regurgitation     a. Echo 01/2012 mild LVH, EF 55-65%, Gr 2 DD, mod MR, mild LAE, PASP 36;  b. Echo (02/2013):  EF 55-60%, Gr 1 DD, mild to mod MR, mild LAE (no sig change since prior echo)  . Chronic diastolic heart failure ([place])   . Hyperlipidemia   . Unspecified vitamin D deficiency   . Heart murmur   . Stroke ([place]) 07    no weakness  . Peripheral vascular disease ([place])     cerebral aneursym  . OSA on CPAP     7 yrs    . GERD (gastroesophageal reflux disease)   . Arthritis     Past Surgical History  Procedure Laterality Date  . Aneurysm coiling    . Head surgery      aneurysm ruptured- stroke  . Abdominal hysterectomy    . Tonsillectomy and adenoidectomy    . Radiology with anesthesia N/A 04/23/2013    Procedure: RADIOLOGY WITH ANESTHESIA;  Surgeon: Rob Hickman, MD;  Location: [place] [place];  Service: Radiology;  Laterality: N/A;     Current Outpatient Prescriptions  Medication Sig Dispense Refill  . amLODipine-valsartan (EXFORGE) 10-320 MG per tablet TAKE ONE TABLET BY MOUTH ONCE DAILY 90 tablet 3  . aspirin 325 MG tablet Take 325 mg by mouth daily.    . colchicine 0.6 MG tablet Take 1 tablet (0.6 mg total) by mouth daily. 30 tablet 2  . cyclobenzaprine (FLEXERIL) 10 MG tablet TAKE ONE TABLET BY MOUTH AT BEDTIME. 30 tablet 0  . Febuxostat 80 MG TABS Take 1 tablet (80 mg total) by mouth daily. 30 tablet 2  . metoprolol succinate (TOPROL-XL) 25 MG 24 hr tablet TAKE ONE TABLET BY MOUTH ONCE DAILY 30 tablet 11  . Multiple Vitamins-Minerals (MULTIVITAMIN WITH MINERALS) tablet Take 1 tablet by mouth daily.    . Omega-3 Fatty  Acids (FISH OIL) 1200 MG CAPS Take 2,400 mg by mouth daily.     . pantoprazole (PROTONIX) 40 MG tablet Take 1 tablet (40 mg total) by mouth daily.    . potassium chloride (K-DUR) 10 MEQ tablet TAKE ONE TABLET BY MOUTH ONCE DAILY 90 tablet 1  . PREMARIN 0.9 MG tablet TAKE ONE TABLET BY MOUTH ONCE DAILY. NEEDS OFFICE VISIT BEFORE FURTHER REFILLS. 30 tablet 2  . vitamin B-12 (CYANOCOBALAMIN) 1000 MCG tablet Take 1,000 mcg by mouth daily.    . furosemide (LASIX) 80 MG tablet Take 1 tablet (80 mg total) by mouth daily. (Patient not taking: Reported on 03/11/2015) 90 tablet 1   No current facility-administered medications for this visit.    Allergies:   Aspirin and Ace inhibitors    Social History:  The patient  reports that she quit smoking about 16 years ago. Her smoking use  included Cigarettes. She has a .75 pack-year smoking history. She does not have any smokeless tobacco history on file. She reports that she does not drink alcohol or use illicit drugs.   Family History:  The patient's family history includes Hypertension in her father, sister, sister, and sister; Kidney disease in her father.    ROS:  Please see the history of present illness.    Review of Systems: Constitutional:  denies fever, chills, diaphoresis, appetite change and fatigue.  HEENT: denies photophobia, eye pain, redness, hearing loss, ear pain, congestion, sore throat, rhinorrhea, sneezing, neck pain, neck stiffness and tinnitus.  Respiratory: denies SOB, DOE, cough, chest tightness, and wheezing.  Cardiovascular: denies chest pain, palpitations and leg swelling.  Gastrointestinal: denies nausea, vomiting, abdominal pain, diarrhea, constipation, blood in stool.  Genitourinary: denies dysuria, urgency, frequency, hematuria, flank pain and difficulty urinating.  Musculoskeletal: denies  myalgias, back pain, joint swelling, arthralgias and gait problem.   Skin: denies pallor, rash and wound.  Neurological: denies dizziness, seizures, syncope, weakness, light-headedness, numbness and headaches.   Hematological: denies adenopathy, easy bruising, personal or family bleeding history.  Psychiatric/ Behavioral: denies suicidal ideation, mood changes, confusion, nervousness, sleep disturbance and agitation.       All other systems are reviewed and negative.    PHYSICAL EXAM: VS:  BP 150/102 mmHg  Pulse 66  Ht 5\' 11"  (1.803 m)  Wt 218 lb 12.8 oz (99.247 kg)  BMI 30.53 kg/m2 , BMI Body mass index is 30.53 kg/(m^2). GEN: Well nourished, well developed, in no acute distress HEENT: normal Neck: no JVD, carotid bruits, or masses Cardiac: RRR; 2/6 systolic murmur, rubs, or gallops, trace lege edema  Respiratory:  clear to auscultation bilaterally, normal work of breathing GI: soft, nontender,  nondistended, + BS MS: no deformity or atrophy, some mild chronic stasis changes.  Skin: warm and dry, no rash Neuro:  Strength and sensation are intact Psych: normal   EKG:  EKG is ordered today. Feb. 16, 2017:   NSR at 66.       Recent Labs: 10/23/2014: ALT 8; BUN 12; Creat 0.93; Hemoglobin 10.8*; Magnesium 1.4*; Platelets 275; Potassium 3.9; Sodium 136; TSH 2.216    Lipid Panel    Component Value Date/Time   CHOL 190 10/23/2014 1001   TRIG 106 10/23/2014 1001   HDL 117 10/23/2014 1001   CHOLHDL 1.6 10/23/2014 1001   VLDL 21 10/23/2014 1001   LDLCALC 52 10/23/2014 1001      Wt Readings from Last 3 Encounters:  03/11/15 218 lb 12.8 oz (99.247 kg)  10/23/14 223 lb (101.152 kg)  08/27/14 226 lb (102.513 kg)      Other studies Reviewed: Additional studies/ records that were reviewed today include: . Review of the above records demonstrates:    ASSESSMENT AND PLAN:  1. Hypertension - BP is well controlled .  Continue current meds.  Will check BMP and mag - both were a bit low at her last check   2. Mitral regurgitation - mild - moderate , stable   3. Cerebral aneurysm- s/p repair    Current medicines are reviewed at length with the patient today.  The patient does not have concerns regarding medicines.  The following changes have been made:  no change   Disposition:   FU with me in 1 year     Signed, Jamarie Mussa, Wonda Cheng, MD  03/11/2015 10:08 AM    [facility] [address], [place], [place]  [address] Phone: [phone]; Fax: [phone]

## 2015-03-11 NOTE — Patient Instructions (Signed)
Medication Instructions:  DECREASE Aspirin to 81 mg once daily   Labwork: TODAY - magnesium level, basic metabolic panel   Testing/Procedures: None Ordered   Follow-Up: Your physician wants you to follow-up in: 1 year with Dr. Acie Fredrickson.  You will receive a reminder letter in the mail two months in advance. If you don't receive a letter, please call our office to schedule the follow-up appointment.   If you need a refill on your cardiac medications before your next appointment, please call your pharmacy.   Thank you for choosing [facility]! Christen Bame, RN [phone]

## 2015-03-17 ENCOUNTER — Other Ambulatory Visit: Payer: Self-pay | Admitting: Nurse Practitioner

## 2015-03-17 DIAGNOSIS — I10 Essential (primary) hypertension: Secondary | ICD-10-CM

## 2015-04-01 ENCOUNTER — Other Ambulatory Visit: Payer: Self-pay | Admitting: Nephrology

## 2015-04-01 DIAGNOSIS — R809 Proteinuria, unspecified: Secondary | ICD-10-CM

## 2015-04-01 DIAGNOSIS — N182 Chronic kidney disease, stage 2 (mild): Secondary | ICD-10-CM

## 2015-04-06 ENCOUNTER — Ambulatory Visit (INDEPENDENT_AMBULATORY_CARE_PROVIDER_SITE_OTHER): Payer: BLUE CROSS/BLUE SHIELD | Admitting: Internal Medicine

## 2015-04-06 ENCOUNTER — Encounter: Payer: Self-pay | Admitting: Internal Medicine

## 2015-04-06 VITALS — BP 144/80 | HR 68 | Temp 98.2°F | Resp 18 | Ht 71.0 in | Wt 213.0 lb

## 2015-04-06 DIAGNOSIS — I10 Essential (primary) hypertension: Secondary | ICD-10-CM

## 2015-04-06 NOTE — Progress Notes (Signed)
   Subjective:    Patient ID: Yolanda Espinoza, female    DOB: 03/26/1955, 60 y.o.   MRN: [id number]  HPI  Patient presents to the office for evaluation of blood pressure.  She reports that blood pressure has been running approximately 155/95 or higher in the morning.  She has been seeing Dr. Kavin Leech office.  She has been taking her medication at home.  She has not changed her diet at all.  She reports that she has been feeling fine.  No CP, shortness of breath, or vision changes.  She has been taking exforge and has been taking HCTZ 12.5 mg in the evenings.     Review of Systems  Constitutional: Negative for fever, chills and fatigue.  Respiratory: Negative for chest tightness and shortness of breath.   Cardiovascular: Negative for chest pain and palpitations.  Gastrointestinal: Negative for nausea and vomiting.       Objective:   Physical Exam  Constitutional: She is oriented to person, place, and time. She appears well-developed and well-nourished. No distress.  HENT:  Head: Normocephalic.  Mouth/Throat: Oropharynx is clear and moist. No oropharyngeal exudate.  Eyes: Conjunctivae are normal. No scleral icterus.  Neck: Normal range of motion. Neck supple. No JVD present. No thyromegaly present.  Cardiovascular: Normal rate, regular rhythm, normal heart sounds and intact distal pulses.  Exam reveals no gallop and no friction rub.   No murmur heard. Pulmonary/Chest: Effort normal and breath sounds normal. No respiratory distress. She has no wheezes. She has no rales. She exhibits no tenderness.  Abdominal: Soft. Bowel sounds are normal. She exhibits no distension and no mass. There is no tenderness. There is no rebound and no guarding.  Musculoskeletal: Normal range of motion.  Lymphadenopathy:    She has no cervical adenopathy.  Neurological: She is alert and oriented to person, place, and time. No cranial nerve deficit. Coordination normal.  Skin: Skin is warm and dry. She is not  diaphoretic.  Psychiatric: She has a normal mood and affect. Her behavior is normal. Judgment and thought content normal.  Nursing note and vitals reviewed.   Filed Vitals:   04/06/15 1441  BP: 144/80  Pulse: 68  Temp: 98.2 F (36.8 C)  Resp: 18          Assessment & Plan:    1. Benign essential HTN -change HCTZ in the morning -continue exforge in the evenings -keep BP log and bring in a week  -if continued elevation will add back in another medication

## 2015-04-06 NOTE — Patient Instructions (Signed)
Please take the white capsule HCTZ in the mornings.  Please keep taking the exforge at night time.  Please check your blood pressure throughout the day and let me know what it is doing.  If it is running 160/90 or higher please call the office.  Keep a log and write down the blood pressures and bring that to me next week.  We will decide then whether we need to add another medication.

## 2015-04-08 ENCOUNTER — Ambulatory Visit
Admission: RE | Admit: 2015-04-08 | Discharge: 2015-04-08 | Disposition: A | Discharge status: Home / Self Care | Payer: BLUE CROSS/BLUE SHIELD | Source: Ambulatory Visit | Attending: Nephrology | Admitting: Nephrology

## 2015-04-08 DIAGNOSIS — N182 Chronic kidney disease, stage 2 (mild): Secondary | ICD-10-CM

## 2015-04-08 DIAGNOSIS — R809 Proteinuria, unspecified: Secondary | ICD-10-CM

## 2015-04-15 ENCOUNTER — Other Ambulatory Visit: Payer: BLUE CROSS/BLUE SHIELD | Admitting: *Deleted

## 2015-04-15 DIAGNOSIS — I10 Essential (primary) hypertension: Secondary | ICD-10-CM

## 2015-04-15 LAB — BASIC METABOLIC PANEL
BUN: 19 mg/dL (ref 7–25)
CALCIUM: 9.5 mg/dL (ref 8.6–10.4)
CO2: 28 mmol/L (ref 20–31)
Chloride: 101 mmol/L (ref 98–110)
Creat: 1.02 mg/dL (ref 0.50–1.05)
GLUCOSE: 80 mg/dL (ref 65–99)
Potassium: 4 mmol/L (ref 3.5–5.3)
SODIUM: 138 mmol/L (ref 135–146)

## 2015-04-15 LAB — MAGNESIUM: Magnesium: 1.4 mg/dL — ABNORMAL LOW (ref 1.5–2.5)

## 2015-05-18 ENCOUNTER — Other Ambulatory Visit: Payer: Self-pay | Admitting: Physician Assistant

## 2015-05-24 ENCOUNTER — Other Ambulatory Visit: Payer: Self-pay | Admitting: Cardiovascular Disease

## 2015-05-25 ENCOUNTER — Other Ambulatory Visit: Payer: Self-pay

## 2015-05-25 MED ORDER — AMLODIPINE BESYLATE-VALSARTAN 10-320 MG PO TABS: 1.0000 | ORAL_TABLET | Freq: Every day | ORAL | Status: DC

## 2015-06-13 ENCOUNTER — Encounter: Payer: Self-pay | Admitting: *Deleted

## 2015-10-26 ENCOUNTER — Encounter: Payer: Self-pay | Admitting: Physician Assistant

## 2015-10-26 ENCOUNTER — Ambulatory Visit (INDEPENDENT_AMBULATORY_CARE_PROVIDER_SITE_OTHER): Payer: BLUE CROSS/BLUE SHIELD | Admitting: Physician Assistant

## 2015-10-26 VITALS — BP 122/80 | HR 57 | Temp 97.3°F | Resp 16 | Ht 71.0 in | Wt 217.2 lb

## 2015-10-26 DIAGNOSIS — G4733 Obstructive sleep apnea (adult) (pediatric): Secondary | ICD-10-CM | POA: Diagnosis not present

## 2015-10-26 DIAGNOSIS — Z23 Encounter for immunization: Secondary | ICD-10-CM

## 2015-10-26 DIAGNOSIS — I671 Cerebral aneurysm, nonruptured: Secondary | ICD-10-CM | POA: Diagnosis not present

## 2015-10-26 DIAGNOSIS — R7303 Prediabetes: Secondary | ICD-10-CM

## 2015-10-26 DIAGNOSIS — E669 Obesity, unspecified: Secondary | ICD-10-CM

## 2015-10-26 DIAGNOSIS — I5032 Chronic diastolic (congestive) heart failure: Secondary | ICD-10-CM | POA: Diagnosis not present

## 2015-10-26 DIAGNOSIS — Z79899 Other long term (current) drug therapy: Secondary | ICD-10-CM | POA: Diagnosis not present

## 2015-10-26 DIAGNOSIS — Z0001 Encounter for general adult medical examination with abnormal findings: Secondary | ICD-10-CM

## 2015-10-26 DIAGNOSIS — I10 Essential (primary) hypertension: Secondary | ICD-10-CM | POA: Diagnosis not present

## 2015-10-26 DIAGNOSIS — Z1159 Encounter for screening for other viral diseases: Secondary | ICD-10-CM

## 2015-10-26 DIAGNOSIS — D649 Anemia, unspecified: Secondary | ICD-10-CM

## 2015-10-26 DIAGNOSIS — R6889 Other general symptoms and signs: Secondary | ICD-10-CM | POA: Diagnosis not present

## 2015-10-26 DIAGNOSIS — E785 Hyperlipidemia, unspecified: Secondary | ICD-10-CM

## 2015-10-26 DIAGNOSIS — E559 Vitamin D deficiency, unspecified: Secondary | ICD-10-CM

## 2015-10-26 DIAGNOSIS — I340 Nonrheumatic mitral (valve) insufficiency: Secondary | ICD-10-CM | POA: Diagnosis not present

## 2015-10-26 DIAGNOSIS — R809 Proteinuria, unspecified: Secondary | ICD-10-CM

## 2015-10-26 DIAGNOSIS — Z9989 Dependence on other enabling machines and devices: Secondary | ICD-10-CM

## 2015-10-26 LAB — VITAMIN B12: VITAMIN B 12: 1606 pg/mL — AB (ref 200–1100)

## 2015-10-26 LAB — CBC WITH DIFFERENTIAL/PLATELET
BASOS ABS: 70 {cells}/uL (ref 0–200)
BASOS PCT: 1 %
EOS ABS: 280 {cells}/uL (ref 15–500)
Eosinophils Relative: 4 %
HEMATOCRIT: 33 % — AB (ref 35.0–45.0)
Hemoglobin: 10.7 g/dL — ABNORMAL LOW (ref 11.7–15.5)
LYMPHS ABS: 3220 {cells}/uL (ref 850–3900)
Lymphocytes Relative: 46 %
MCH: 27.3 pg (ref 27.0–33.0)
MCHC: 32.4 g/dL (ref 32.0–36.0)
MCV: 84.2 fL (ref 80.0–100.0)
MONOS PCT: 8 %
MPV: 9.5 fL (ref 7.5–12.5)
Monocytes Absolute: 560 cells/uL (ref 200–950)
NEUTROS ABS: 2870 {cells}/uL (ref 1500–7800)
Neutrophils Relative %: 41 %
Platelets: 270 10*3/uL (ref 140–400)
RBC: 3.92 MIL/uL (ref 3.80–5.10)
RDW: 15.7 % — AB (ref 11.0–15.0)
WBC: 7 10*3/uL (ref 3.8–10.8)

## 2015-10-26 LAB — TSH: TSH: 2.02 m[IU]/L

## 2015-10-26 LAB — FERRITIN: FERRITIN: 77 ng/mL (ref 20–288)

## 2015-10-26 NOTE — Progress Notes (Signed)
Complete Physical  Assessment and Plan: Essential hypertension - continue medications, DASH diet, exercise and monitor at home. Call if greater than 130/80.  - CBC with Differential/Platelet - BASIC METABOLIC PANEL WITH GFR - Hepatic function panel - TSH - Urinalysis, Routine w reflex microscopic (not at [facility]) - Microalbumin / creatinine urine ratio   Prediabetes Discussed general issues about diabetes pathophysiology and management., Educational material distributed., Suggested low cholesterol diet., Encouraged aerobic exercise., Discussed foot care., Reminded to get yearly retinal exam. - Hemoglobin A1c - Insulin, fasting  Hyperlipidemia -continue medications, check lipids, decrease fatty foods, increase activity. - Lipid panel  Obesity Obesity with co morbidities- long discussion about weight loss, diet, and exercise  Chronic diastolic heart failure Weight up, decrease salt/sugar, monitor weight, BP stable.    Mitral regurgitation Monitored by cardio, no SOB/CP  Vitamin D deficiency - Vit D  25 hydroxy (rtn osteoporosis monitoring)  Medication management - Magnesium  OSA on CPAP Need new CPAP, call number may need new study, weight loss advised.  Very important with heart/CVA history to get back on CPAP   Brain aneurysm Control blood pressure, cholesterol, glucose, increase exercise.   Gout of foot, unspecified cause, unspecified chronicity, unspecified laterality - Uric acid  Encounter for general adult medical examination with abnormal findings - Flu vaccine greater than or equal to 3yo with preservative IM - CBC with Differential/Platelet - BASIC METABOLIC PANEL WITH GFR - Hepatic function panel - TSH - Lipid panel - Hemoglobin A1c - Insulin, fasting - Magnesium - Vit D  25 hydroxy (rtn osteoporosis monitoring) - Urinalysis, Routine w reflex microscopic (not at [facility]) - Iron and TIBC - Ferritin - Vitamin B12 - Uric acid - Microalbumin / creatinine  urine ratio  Need for prophylactic vaccination and inoculation against influenza - Flu vaccine greater than or equal to 3yo with preservative IM  Anemia, unspecified anemia type - Iron and TIBC - Ferritin - Vitamin B12  Discussed med's effects and SE's. Screening labs and tests as requested with regular follow-up as recommended. Over 40 minutes of exam, counseling, chart review, and complex, high level critical decision making was performed this visit.  Future Appointments Date Time Provider [place]  10/30/2016 9:00 AM Vicie Mutters, PA-C [id number] None    HPI  60 y.o. female  presents for a complete physical.  Her blood pressure has been controlled at home, today their BP is BP: 122/80 She does workout, walks daily for 30 mins. She denies chest pain, shortness of breath, dizziness.  She has history of CVA due to aneurysm, PVD and MR, follows with Dr. Sondra Come. She is on 325 ASA. Had stable MRA brain last year. Cough at night, not wearing CPAP over 14 years since new machine/last sleep study, + edema, no PND, no SOB, weight is up 4 lbs.  She works at [employer], going to work 2 more years and retire. 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  She has fallen 2 x in past month, no LOC, did not hit her head. No prodrome before falling, states just mechanical trip, did her right knee but denies any other injuries. She has intermittent right internal arm burning pain, last seconds, no weakness in extremities, no numbness/tingling.  She is not on cholesterol medication and denies myalgias. Her cholesterol is at goal. The cholesterol last visit was:   Lab Results  Component Value Date   CHOL 190 10/23/2014   HDL 117 10/23/2014   LDLCALC 52 10/23/2014  TRIG 106 10/23/2014   CHOLHDL 1.6 10/23/2014   She has been working on diet and exercise for prediabetes, and denies paresthesia of the feet, polydipsia, polyuria and visual disturbances. Last A1C  in the office was:  Lab Results  Component Value Date   HGBA1C 5.6 10/23/2014   She has OSA and is on CPAP, she is on protonix for GERD.  Patient is NOT on allopurinol for gout, she is on colchicine and does not report a recent flare.  Lab Results  Component Value Date   LABURIC 4.2 10/23/2014   BMI is Body mass index is 30.29 kg/m., she is working on diet and exercise. Wt Readings from Last 3 Encounters:  10/26/15 217 lb 3.2 oz (98.5 kg)  04/06/15 213 lb (96.6 kg)  03/11/15 218 lb 12.8 oz (99.2 kg)   Patient is on Vitamin D supplement.   Lab Results  Component Value Date   VD25OH 46 10/23/2014      Current Medications:  Current Outpatient Prescriptions on File Prior to Visit  Medication Sig Dispense Refill  . amLODipine-valsartan (EXFORGE) 10-320 MG tablet Take 1 tablet by mouth daily. 90 tablet 3  . aspirin EC 81 MG tablet Take 1 tablet (81 mg total) by mouth daily.    . colchicine 0.6 MG tablet Take 1 tablet (0.6 mg total) by mouth daily. 30 tablet 2  . Multiple Vitamins-Minerals (MULTIVITAMIN WITH MINERALS) tablet Take 1 tablet by mouth daily.    . Omega-3 Fatty Acids (FISH OIL) 1200 MG CAPS Take 2,400 mg by mouth daily.     . pantoprazole (PROTONIX) 40 MG tablet Take 1 tablet (40 mg total) by mouth daily.    . potassium chloride (K-DUR) 10 MEQ tablet TAKE ONE TABLET BY MOUTH ONCE DAILY 90 tablet 1  . PREMARIN 0.9 MG tablet TAKE ONE TABLET BY MOUTH ONCE DAILY (NEED  OFFICE  VISIT  BEFORE  FURTHER  REFILLS) 90 tablet 1   No current facility-administered medications on file prior to visit.    Health Maintenance:   Immunization History  Administered Date(s) Administered  . Influenza Split 11/11/2013, 10/23/2014  . Influenza, Seasonal, Injecte, Preservative Fre 10/26/2015  . Influenza-Unspecified 11/25/2012  . Tdap 08/25/2013   Tetanus: 2015 Pneumovax: N/A Prevnar 13: N/A Flu vaccine: 2016 DUE today Zostavax: N/A LMP: TAH Pap: TAH  MGM:02/2015  DEXA:  N/A Colonoscopy: 2011, due 10/29/2014 Dr. Collene Mares EGD: N/A CT head 2010 CXR 2015 MRA 08/2014 Renal US 2017 Echo 2015, normal EF, mil-mod AR  Last Dental Exam: Has partial/bridges, no dentist.  Last Eye Exam: [facility], wears glasses, 11/2013, goes yearly Patient Care Team: Unk Pinto, MD as PCP - General (Internal Medicine) Liliane Shi, PA-C as Physician Assistant (Physician Assistant) Juanita Craver, MD as Consulting Physician (Gastroenterology) Mcarthur Rossetti, MD as Consulting Physician (Orthopedic Surgery) Unk Pinto, MD as Consulting Physician (Internal Medicine) Luanne Bras, MD as Consulting Physician (Interventional Radiology)  Medical History:  Past Medical History:  Diagnosis Date  . Arthritis   . Chronic diastolic heart failure ([place])   . GERD (gastroesophageal reflux disease)   . Heart murmur   . Hyperlipidemia   . Hypertension may, 1973  . Mitral regurgitation    a. Echo 01/2012 mild LVH, EF 55-65%, Gr 2 DD, mod MR, mild LAE, PASP 36;  b. Echo (02/2013):  EF 55-60%, Gr 1 DD, mild to mod MR, mild LAE (no sig change since prior echo)  . OSA on CPAP    7 yrs  .  Peripheral vascular disease ([place])    cerebral aneursym  . Stroke ([place]) 07   no weakness  . Subarachnoid hemorrhage due to ruptured aneurysm ([place])    2003 - s/p coiling  . Unspecified vitamin D deficiency    Allergies Allergies  Allergen Reactions  . Aspirin Nausea And Vomiting  . Ace Inhibitors Nausea And Vomiting   SURGICAL HISTORY She  has a past surgical history that includes Aneurysm coiling; head surgery; Abdominal hysterectomy; Tonsillectomy and adenoidectomy; and Radiology with anesthesia (N/A, 04/23/2013). FAMILY HISTORY Her family history includes Hypertension in her father, sister, sister, and sister; Kidney disease in her father. SOCIAL HISTORY She  reports that she quit smoking about 16 years ago. Her smoking use included Cigarettes. She has a 0.75 pack-year smoking  history. She does not have any smokeless tobacco history on file. She reports that she does not drink alcohol or use drugs.  Review of Systems: Review of Systems  Constitutional: Negative.   HENT: Negative.   Respiratory: Positive for cough (dry). Negative for hemoptysis, sputum production, shortness of breath and wheezing.   Cardiovascular: Negative.  Negative for chest pain, palpitations and leg swelling (resolved).  Gastrointestinal: Positive for heartburn. Negative for abdominal pain, blood in stool, constipation, diarrhea, melena, nausea and vomiting.  Genitourinary: Negative.   Musculoskeletal: Positive for back pain and joint pain. Negative for falls, myalgias and neck pain.  Skin: Negative.   Neurological: Negative.  Negative for dizziness and headaches.  Psychiatric/Behavioral: Negative.  Negative for depression, hallucinations, memory loss, substance abuse and suicidal ideas. The patient is not nervous/anxious and does not have insomnia.     Physical Exam: Estimated body mass index is 30.29 kg/m as calculated from the following:   Height as of this encounter: 5\' 11"  (1.803 m).   Weight as of this encounter: 217 lb 3.2 oz (98.5 kg). BP 122/80   Pulse (!) 57   Temp 97.3 F (36.3 C)   Resp 16   Ht 5\' 11"  (1.803 m)   Wt 217 lb 3.2 oz (98.5 kg)   SpO2 99%   BMI 30.29 kg/m  General Appearance: Well nourished, in no apparent distress.  Eyes: PERRLA, EOMs, conjunctiva no swelling or erythema, normal fundi and vessels.  Sinuses: No Frontal/maxillary tenderness  ENT/Mouth: Ext aud canals clear, normal light reflex with TMs without erythema, bulging. Good dentition. No erythema, swelling, or exudate on post pharynx. Tonsils not swollen or erythematous. Hearing normal.  Neck: Supple, thyroid slightly enlarged. No bruits  Respiratory: Respiratory effort normal, BS equal bilaterally without rales, rhonchi, wheezing or stridor.  Cardio: RRR with holosystolic murmur RSB, without rubs  or gallops. Brisk peripheral pulses with 2+ edema.  Chest: symmetric, with normal excursions and percussion.  Breasts: declines  Abdomen: Soft, nontender, no guarding, rebound, hernias, masses, or organomegaly.  Lymphatics: Non tender without lymphadenopathy.  Genitourinary: defer Musculoskeletal: Full ROM all peripheral extremities,5/5 strength, and normal gait.  Skin: Warm, dry without rashes, lesions, ecchymosis. Neuro: Cranial nerves intact, reflexes equal bilaterally. Normal muscle tone, no cerebellar symptoms. Sensation intact.  Psych: Awake and oriented X 3, normal affect, Insight and Judgment appropriate.   EKG: defer. AORTA SCAN: defer  Vicie Mutters 9:00 AM [facility]

## 2015-10-26 NOTE — Patient Instructions (Addendum)
Call the number on your CPAP machine to see if you can get a new one If not you will likely need a new sleep study since it has been so long.  Not wearing the CPAP causes fatigue, swelling, and can hurt your heart and lungs  Add zantac 150mg  at night for heart burn Add allergy pill for cough  Ask if you can get a massage  Do cervical neck pillow versus towel rolled up  Decrease sugars to help decrease swelling  HOME CARE INSTRUCTIONS FOR SWELLING  Do not stand or sit in one position for long periods of time. Do not sit with your legs crossed. Rest with your legs raised during the day.  Your legs have to be higher than your heart so that gravity will force the valves to open, so please really elevate your legs.   Wear elastic stockings or support hose. Do not wear other tight, encircling garments around the legs, pelvis, or waist.  [employer]  has a wide variety of well priced compression stockings. [place], [place] [phone]  Walk as much as possible to increase blood flow.  Raise the foot of your bed at night with 2-inch blocks. SEEK MEDICAL CARE IF:   The skin around your ankle starts to break down.  You have pain, redness, tenderness, or hard swelling developing in your leg over a vein.  You are uncomfortable due to leg pain. Document Released: 10/19/2004 Document Revised: 04/03/2011 Document Reviewed: 03/07/2010 [facility] Patient Information 2014 [place].

## 2015-10-27 LAB — BASIC METABOLIC PANEL WITH GFR
BUN: 13 mg/dL (ref 7–25)
CALCIUM: 9.5 mg/dL (ref 8.6–10.4)
CHLORIDE: 104 mmol/L (ref 98–110)
CO2: 27 mmol/L (ref 20–31)
CREATININE: 1.06 mg/dL — AB (ref 0.50–0.99)
GFR, Est African American: 66 mL/min (ref >=60)
GFR, Est Non African American: 57 mL/min — ABNORMAL LOW (ref >=60)
GLUCOSE: 82 mg/dL (ref 65–99)
Potassium: 4.6 mmol/L (ref 3.5–5.3)
SODIUM: 141 mmol/L (ref 135–146)

## 2015-10-27 LAB — URINALYSIS, ROUTINE W REFLEX MICROSCOPIC
BILIRUBIN URINE: NEGATIVE
Glucose, UA: NEGATIVE
HGB URINE DIPSTICK: NEGATIVE
KETONES UR: NEGATIVE
Leukocytes, UA: NEGATIVE
Nitrite: NEGATIVE
PH: 6.5 (ref 5.0–8.0)
SPECIFIC GRAVITY, URINE: 1.014 (ref 1.001–1.035)

## 2015-10-27 LAB — LIPID PANEL
CHOL/HDL RATIO: 1.4 ratio (ref <=5.0)
Cholesterol: 188 mg/dL (ref 125–200)
HDL: 135 mg/dL (ref >=46)
LDL Cholesterol: 42 mg/dL (ref <130)
Triglycerides: 53 mg/dL (ref <150)
VLDL: 11 mg/dL (ref <30)

## 2015-10-27 LAB — MAGNESIUM: MAGNESIUM: 1.7 mg/dL (ref 1.5–2.5)

## 2015-10-27 LAB — HEPATIC FUNCTION PANEL
ALT: 6 U/L (ref 6–29)
AST: 16 U/L (ref 10–35)
Albumin: 4.2 g/dL (ref 3.6–5.1)
Alkaline Phosphatase: 52 U/L (ref 33–130)
BILIRUBIN DIRECT: 0.1 mg/dL (ref <=0.2)
Indirect Bilirubin: 0.2 mg/dL (ref 0.2–1.2)
TOTAL PROTEIN: 7.7 g/dL (ref 6.1–8.1)
Total Bilirubin: 0.3 mg/dL (ref 0.2–1.2)

## 2015-10-27 LAB — URINALYSIS, MICROSCOPIC ONLY
BACTERIA UA: NONE SEEN [HPF]
CASTS: NONE SEEN [LPF]
Crystals: NONE SEEN [HPF]
RBC / HPF: NONE SEEN RBC/HPF (ref <=2)
WBC, UA: NONE SEEN WBC/HPF (ref <=5)
YEAST: NONE SEEN [HPF]

## 2015-10-27 LAB — HEMOGLOBIN A1C
HEMOGLOBIN A1C: 5.3 % (ref <5.7)
MEAN PLASMA GLUCOSE: 105 mg/dL

## 2015-10-27 LAB — MICROALBUMIN / CREATININE URINE RATIO
CREATININE, URINE: 101 mg/dL (ref 20–320)
Microalb Creat Ratio: 554 mcg/mg creat — ABNORMAL HIGH (ref <30)
Microalb, Ur: 56 mg/dL

## 2015-10-27 LAB — HIV ANTIBODY (ROUTINE TESTING W REFLEX): HIV 1&2 Ab, 4th Generation: NONREACTIVE

## 2015-10-27 LAB — HEPATITIS C ANTIBODY: HCV Ab: NEGATIVE

## 2015-10-27 LAB — IRON AND TIBC
%SAT: 16 % (ref 11–50)
Iron: 58 ug/dL (ref 45–160)
TIBC: 361 ug/dL (ref 250–450)
UIBC: 303 ug/dL (ref 125–400)

## 2015-10-27 LAB — VITAMIN D 25 HYDROXY (VIT D DEFICIENCY, FRACTURES): Vit D, 25-Hydroxy: 35 ng/mL (ref 30–100)

## 2015-11-02 ENCOUNTER — Other Ambulatory Visit (HOSPITAL_COMMUNITY): Payer: Self-pay | Admitting: Interventional Radiology

## 2015-11-02 DIAGNOSIS — I729 Aneurysm of unspecified site: Secondary | ICD-10-CM

## 2015-11-08 ENCOUNTER — Ambulatory Visit (HOSPITAL_COMMUNITY): Payer: BLUE CROSS/BLUE SHIELD

## 2015-11-08 ENCOUNTER — Ambulatory Visit (HOSPITAL_COMMUNITY)
Admission: RE | Admit: 2015-11-08 | Discharge: 2015-11-08 | Disposition: A | Discharge status: Home / Self Care | Payer: BLUE CROSS/BLUE SHIELD | Source: Ambulatory Visit | Attending: Interventional Radiology | Admitting: Interventional Radiology

## 2015-11-08 DIAGNOSIS — I6782 Cerebral ischemia: Secondary | ICD-10-CM | POA: Diagnosis not present

## 2015-11-08 DIAGNOSIS — I729 Aneurysm of unspecified site: Secondary | ICD-10-CM | POA: Diagnosis not present

## 2015-11-08 DIAGNOSIS — I671 Cerebral aneurysm, nonruptured: Secondary | ICD-10-CM | POA: Diagnosis not present

## 2015-11-08 MED ORDER — GADOBENATE DIMEGLUMINE 529 MG/ML IV SOLN
20.0000 mL | Freq: Once | INTRAVENOUS | Status: AC
  Administered 2015-11-08: 20 mL via INTRAVENOUS

## 2015-11-10 DIAGNOSIS — I639 Cerebral infarction, unspecified: Secondary | ICD-10-CM | POA: Diagnosis not present

## 2015-11-10 DIAGNOSIS — R809 Proteinuria, unspecified: Secondary | ICD-10-CM | POA: Diagnosis not present

## 2015-11-10 DIAGNOSIS — A5900 Urogenital trichomoniasis, unspecified: Secondary | ICD-10-CM | POA: Diagnosis not present

## 2015-11-10 DIAGNOSIS — D649 Anemia, unspecified: Secondary | ICD-10-CM | POA: Diagnosis not present

## 2015-11-10 DIAGNOSIS — N182 Chronic kidney disease, stage 2 (mild): Secondary | ICD-10-CM | POA: Diagnosis not present

## 2015-11-10 DIAGNOSIS — I10 Essential (primary) hypertension: Secondary | ICD-10-CM | POA: Diagnosis not present

## 2015-11-16 ENCOUNTER — Telehealth (HOSPITAL_COMMUNITY): Payer: Self-pay

## 2015-11-16 NOTE — Telephone Encounter (Signed)
Left message for pt to return call. AW 

## 2015-12-01 ENCOUNTER — Other Ambulatory Visit: Payer: Self-pay | Admitting: Internal Medicine

## 2015-12-29 DIAGNOSIS — H10413 Chronic giant papillary conjunctivitis, bilateral: Secondary | ICD-10-CM | POA: Diagnosis not present

## 2015-12-29 DIAGNOSIS — H04123 Dry eye syndrome of bilateral lacrimal glands: Secondary | ICD-10-CM | POA: Diagnosis not present

## 2016-02-01 DIAGNOSIS — Z86010 Personal history of colonic polyps: Secondary | ICD-10-CM | POA: Diagnosis not present

## 2016-02-01 DIAGNOSIS — D509 Iron deficiency anemia, unspecified: Secondary | ICD-10-CM | POA: Diagnosis not present

## 2016-02-01 DIAGNOSIS — K219 Gastro-esophageal reflux disease without esophagitis: Secondary | ICD-10-CM | POA: Diagnosis not present

## 2016-03-01 DIAGNOSIS — Z1231 Encounter for screening mammogram for malignant neoplasm of breast: Secondary | ICD-10-CM | POA: Diagnosis not present

## 2016-03-14 ENCOUNTER — Encounter: Payer: Self-pay | Admitting: Physician Assistant

## 2016-03-14 ENCOUNTER — Ambulatory Visit (INDEPENDENT_AMBULATORY_CARE_PROVIDER_SITE_OTHER): Payer: BLUE CROSS/BLUE SHIELD | Admitting: Physician Assistant

## 2016-03-14 VITALS — BP 130/90 | HR 78 | Temp 97.7°F | Resp 16 | Ht 71.0 in | Wt 221.6 lb

## 2016-03-14 DIAGNOSIS — R05 Cough: Secondary | ICD-10-CM | POA: Diagnosis not present

## 2016-03-14 DIAGNOSIS — R059 Cough, unspecified: Secondary | ICD-10-CM

## 2016-03-14 DIAGNOSIS — I5032 Chronic diastolic (congestive) heart failure: Secondary | ICD-10-CM | POA: Diagnosis not present

## 2016-03-14 DIAGNOSIS — R0600 Dyspnea, unspecified: Secondary | ICD-10-CM | POA: Diagnosis not present

## 2016-03-14 DIAGNOSIS — Z9989 Dependence on other enabling machines and devices: Secondary | ICD-10-CM

## 2016-03-14 DIAGNOSIS — G4733 Obstructive sleep apnea (adult) (pediatric): Secondary | ICD-10-CM

## 2016-03-14 LAB — CBC WITH DIFFERENTIAL/PLATELET
BASOS ABS: 76 {cells}/uL (ref 0–200)
BASOS PCT: 1 %
EOS ABS: 228 {cells}/uL (ref 15–500)
Eosinophils Relative: 3 %
HEMATOCRIT: 31.6 % — AB (ref 35.0–45.0)
Hemoglobin: 10.1 g/dL — ABNORMAL LOW (ref 11.7–15.5)
LYMPHS PCT: 35 %
Lymphs Abs: 2660 cells/uL (ref 850–3900)
MCH: 26.6 pg — AB (ref 27.0–33.0)
MCHC: 32 g/dL (ref 32.0–36.0)
MCV: 83.2 fL (ref 80.0–100.0)
MONO ABS: 532 {cells}/uL (ref 200–950)
MONOS PCT: 7 %
MPV: 9.3 fL (ref 7.5–12.5)
Neutro Abs: 4104 cells/uL (ref 1500–7800)
Neutrophils Relative %: 54 %
Platelets: 264 10*3/uL (ref 140–400)
RBC: 3.8 MIL/uL (ref 3.80–5.10)
RDW: 16 % — ABNORMAL HIGH (ref 11.0–15.0)
WBC: 7.6 10*3/uL (ref 3.8–10.8)

## 2016-03-14 LAB — HEPATIC FUNCTION PANEL
ALBUMIN: 4 g/dL (ref 3.6–5.1)
ALK PHOS: 48 U/L (ref 33–130)
ALT: 6 U/L (ref 6–29)
AST: 16 U/L (ref 10–35)
Bilirubin, Direct: 0.1 mg/dL (ref <=0.2)
Indirect Bilirubin: 0.2 mg/dL (ref 0.2–1.2)
TOTAL PROTEIN: 7.3 g/dL (ref 6.1–8.1)
Total Bilirubin: 0.3 mg/dL (ref 0.2–1.2)

## 2016-03-14 LAB — BASIC METABOLIC PANEL WITH GFR
BUN: 14 mg/dL (ref 7–25)
CHLORIDE: 103 mmol/L (ref 98–110)
CO2: 27 mmol/L (ref 20–31)
Calcium: 9.4 mg/dL (ref 8.6–10.4)
Creat: 1.13 mg/dL — ABNORMAL HIGH (ref 0.50–0.99)
GFR, Est African American: 61 mL/min (ref >=60)
GFR, Est Non African American: 53 mL/min — ABNORMAL LOW (ref >=60)
GLUCOSE: 73 mg/dL (ref 65–99)
POTASSIUM: 4.2 mmol/L (ref 3.5–5.3)
Sodium: 138 mmol/L (ref 135–146)

## 2016-03-14 MED ORDER — FUROSEMIDE 40 MG PO TABS: 40.0000 mg | ORAL_TABLET | Freq: Every day | ORAL | 11 refills | Status: DC

## 2016-03-14 NOTE — Patient Instructions (Signed)
Please pick one of the over the counter allergy medications below and take it once daily for allergies.  Claritin or loratadine cheapest but likely the weakest  Zyrtec or certizine at night because it can make you sleepy The strongest is allegra or fexafinadine  Cheapest at [employer], [employer], [employer]  Get on flonase or nasocort spray from over the counter Can do a steroid nasal spary 1-2 sparys at night each nostril. Remember to spray each nostril twice towards the outer part of your eye.  Do not sniff but instead pinch your nose and tilt your head back to help the medicine get into your sinuses.  The best time to do this is at bedtime. Stop if you get blurred vision or nose bleeds.   If you start to have fever, chills, worsening shortness of breath call the office.   Weight yourself daily, start lasix 40mg  daily  Prop self up in bed/prop bed up

## 2016-03-14 NOTE — Progress Notes (Signed)
Subjective:    Patient ID: Yolanda Espinoza, female    DOB: [birth year], 61 y.o.   MRN: [id number]  HPI 62 y.o. AAF with history of MR, HTN, diastolic heart failure, CVA, OSA on CPAP presents with cough x 2 weeks. Weight is up 8 lbs total from a year ago, 4 lbs from a few months, cough is worse at night, mild leg swelling, SOB with lying flat will sleep on her side, no PND and has sinus stuffy during the day. She was on a fluid pill, has been off x 1 year. Denies fever, chills, no wheezing. Has not done any OTC meds.  Wt Readings from Last 3 Encounters:  03/14/16 221 lb 9.6 oz (100.5 kg)  10/26/15 217 lb 3.2 oz (98.5 kg)  04/06/15 213 lb (96.6 kg)   Blood pressure 130/90, pulse 78, temperature 97.7 F (36.5 C), resp. rate 16, height 5\' 11"  (1.803 m), weight 221 lb 9.6 oz (100.5 kg), SpO2 98 %.  Medications Current Outpatient Prescriptions on File Prior to Visit  Medication Sig  . amLODipine-valsartan (EXFORGE) 10-320 MG tablet Take 1 tablet by mouth daily.  Marland Kitchen aspirin EC 81 MG tablet Take 1 tablet (81 mg total) by mouth daily.  . colchicine 0.6 MG tablet Take 1 tablet (0.6 mg total) by mouth daily.  . Multiple Vitamins-Minerals (MULTIVITAMIN WITH MINERALS) tablet Take 1 tablet by mouth daily.  . Omega-3 Fatty Acids (FISH OIL) 1200 MG CAPS Take 2,400 mg by mouth daily.   . pantoprazole (PROTONIX) 40 MG tablet Take 1 tablet (40 mg total) by mouth daily.  . potassium chloride (K-DUR) 10 MEQ tablet TAKE ONE TABLET BY MOUTH ONCE DAILY  . PREMARIN 0.9 MG tablet TAKE ONE TABLET BY MOUTH ONCE DAILY (NEED  OFFICE  VISIT  BEFORE  FURTHER  REFILLS).   No current facility-administered medications on file prior to visit.     Problem list She has Essential hypertension; Mitral regurgitation; Chronic diastolic heart failure ([place]); OSA on CPAP; Vitamin D deficiency; Hyperlipidemia; Brain aneurysm; Prediabetes; Medication management; Obesity; and Gout on her problem list.   Review of Systems   Constitutional: Negative for chills, diaphoresis, fatigue and fever.  HENT: Positive for congestion (during the day) and sneezing. Negative for postnasal drip, rhinorrhea, sinus pain, sinus pressure and sore throat.   Respiratory: Positive for cough (worse at night) and shortness of breath. Negative for apnea, choking, chest tightness, wheezing and stridor.   Cardiovascular: Positive for leg swelling. Negative for chest pain and palpitations.  Gastrointestinal: Negative.   Skin: Negative for wound.  All other systems reviewed and are negative.      Objective:   Physical Exam  Constitutional: She is oriented to person, place, and time. She appears well-developed and well-nourished. No distress.  HENT:  Head: Normocephalic.  Mouth/Throat: Oropharynx is clear and moist. No oropharyngeal exudate.  Eyes: Conjunctivae are normal. No scleral icterus.  Neck: Normal range of motion. Neck supple. No JVD present. No thyromegaly present.  Cardiovascular: Normal rate, regular rhythm, normal heart sounds and intact distal pulses.  Exam reveals no gallop and no friction rub.   No murmur heard. Pulmonary/Chest: Effort normal and breath sounds normal. No respiratory distress. She has no wheezes. She has no rales. She exhibits no tenderness.  Abdominal: Soft. Bowel sounds are normal. She exhibits no distension and no mass. There is no tenderness. There is no rebound and no guarding.  Musculoskeletal: Normal range of motion. She exhibits edema (1-2+ edema, no warmth, tenderness,  redness).  Lymphadenopathy:    She has no cervical adenopathy.  Neurological: She is alert and oriented to person, place, and time. No cranial nerve deficit. Coordination normal.  Skin: Skin is warm and dry. She is not diaphoretic. No erythema.  Psychiatric: She has a normal mood and affect. Her behavior is normal. Judgment and thought content normal.  Nursing note and vitals reviewed.     Assessment & Plan:   Chronic  diastolic heart failure (HCC) -     furosemide (LASIX) 40 MG tablet; Take 1 tablet (40 mg total) by mouth daily. - weigh daily - restrict fluids, restrict salt/sugar - go to Er if any worsening SOB, cough or any CP  OSA on CPAP Will try to get tubing/mask  Cough - no fever, chills, lungs CTAB, will do allergy pill/flonase and add on lasix since + weight gain, swelling and no signs of infection, if any bump in CBC will send in ABX -     CBC with Differential/Platelet -     BASIC METABOLIC PANEL WITH GFR -     Hepatic function panel -     Brain natriuretic peptide -     furosemide (LASIX) 40 MG tablet; Take 1 tablet (40 mg total) by mouth daily.

## 2016-03-15 LAB — BRAIN NATRIURETIC PEPTIDE: Brain Natriuretic Peptide: 168.7 pg/mL — ABNORMAL HIGH (ref <100)

## 2016-03-20 NOTE — Addendum Note (Signed)
Addended by: Vicie Mutters R on: 03/20/2016 11:22 AM   Modules accepted: Orders

## 2016-03-21 NOTE — Progress Notes (Signed)
Sent sleep study, note, demo to [facility]. Waiting for confirmation that they can assist her.

## 2016-04-03 DIAGNOSIS — G4733 Obstructive sleep apnea (adult) (pediatric): Secondary | ICD-10-CM | POA: Diagnosis not present

## 2016-04-04 DIAGNOSIS — G4733 Obstructive sleep apnea (adult) (pediatric): Secondary | ICD-10-CM | POA: Diagnosis not present

## 2016-04-07 ENCOUNTER — Ambulatory Visit (INDEPENDENT_AMBULATORY_CARE_PROVIDER_SITE_OTHER): Payer: BLUE CROSS/BLUE SHIELD | Admitting: Cardiovascular Disease

## 2016-04-07 ENCOUNTER — Encounter: Payer: Self-pay | Admitting: Cardiovascular Disease

## 2016-04-07 VITALS — BP 140/84 | HR 61 | Ht 71.0 in | Wt 212.8 lb

## 2016-04-07 DIAGNOSIS — I340 Nonrheumatic mitral (valve) insufficiency: Secondary | ICD-10-CM | POA: Diagnosis not present

## 2016-04-07 DIAGNOSIS — I10 Essential (primary) hypertension: Secondary | ICD-10-CM

## 2016-04-07 NOTE — Progress Notes (Signed)
Cardiology Office Note   Date:  04/07/2016   ID:  Yolanda Espinoza, DOB 01/25/1955, MRN [id number]  PCP:  Alesia Richards, MD  Cardiologist:   Mertie Moores, MD   Chief Complaint  Patient presents with  . Hypertension   1. hypertension 2. Heart murmur 3. Cerebral aneurysm  History of Present Illness:  Yolanda Espinoza is a 61 year old female with a history of hypertension and a cerebral aneurysm. She was to have a heart murmur and was referred here for further evaluation. She also has been having some shortness of breath. She exercises for 30 minutes every day typically does not have much shortness breath when she is exercising. She seems to have shortness of breath at other times. She denies any syncope or presyncope. She denies any chest pain.  She works at [employer]. She does also helps the seniors with activities.  Hx of blood pressure on occasion. Her blood pressure readings have been normal.   Feb. 17, 2016:   Yolanda Espinoza is a 61 y.o. female who presents for follow up of her HTN She is dong well.  She has had the cerebral coils removed and had another procedure. Headaches have resolved. BP is well controlled.  Getting some exercise.  Still working at [employer].  Feb. 16, 2017: Doing well.  BP has been well controlled.   Has lost some weight.   Exercising   April 07, 2016:    Yolanda Espinoza is seen back.   Has had some leg swelling  ( is on Amlodipine )  Some dyspnea.   Past Medical History:  Diagnosis Date  . Arthritis   . Chronic diastolic heart failure ([place])   . GERD (gastroesophageal reflux disease)   . Heart murmur   . Hyperlipidemia   . Hypertension may, 1973  . Mitral regurgitation    a. Echo 01/2012 mild LVH, EF 55-65%, Gr 2 DD, mod MR, mild LAE, PASP 36;  b. Echo (02/2013):  EF 55-60%, Gr 1 DD, mild to mod MR, mild LAE (no sig change since prior echo)  . OSA on CPAP    7 yrs  . Peripheral vascular disease ([place])    cerebral  aneursym  . Stroke ([place]) 07   no weakness  . Subarachnoid hemorrhage due to ruptured aneurysm ([place])    2003 - s/p coiling  . Unspecified vitamin D deficiency     Past Surgical History:  Procedure Laterality Date  . ABDOMINAL HYSTERECTOMY    . ANEURYSM COILING    . head surgery     aneurysm ruptured- stroke  . RADIOLOGY WITH ANESTHESIA N/A 04/23/2013   Procedure: RADIOLOGY WITH ANESTHESIA;  Surgeon: Rob Hickman, MD;  Location: [facility];  Service: Radiology;  Laterality: N/A;  . TONSILLECTOMY AND ADENOIDECTOMY       Current Outpatient Prescriptions  Medication Sig Dispense Refill  . amLODipine-valsartan (EXFORGE) 10-320 MG tablet Take 1 tablet by mouth daily. 90 tablet 3  . aspirin EC 81 MG tablet Take 1 tablet (81 mg total) by mouth daily.    . colchicine 0.6 MG tablet Take 1 tablet (0.6 mg total) by mouth daily. 30 tablet 2  . furosemide (LASIX) 40 MG tablet Take 1 tablet (40 mg total) by mouth daily. 30 tablet 11  . Multiple Vitamins-Minerals (MULTIVITAMIN WITH MINERALS) tablet Take 1 tablet by mouth daily.    . Omega-3 Fatty Acids (FISH OIL) 1200 MG CAPS Take 2,400 mg by mouth daily.     Marland Kitchen  pantoprazole (PROTONIX) 40 MG tablet Take 1 tablet (40 mg total) by mouth daily.    . potassium chloride (K-DUR) 10 MEQ tablet TAKE ONE TABLET BY MOUTH ONCE DAILY 90 tablet 1  . PREMARIN 0.9 MG tablet TAKE ONE TABLET BY MOUTH ONCE DAILY (NEED  OFFICE  VISIT  BEFORE  FURTHER  REFILLS). 90 tablet 1   No current facility-administered medications for this visit.     Allergies:   Aspirin and Ace inhibitors    Social History:  The patient  reports that she quit smoking about 17 years ago. Her smoking use included Cigarettes. She has a 0.75 pack-year smoking history. She has never used smokeless tobacco. She reports that she does not drink alcohol or use drugs.   Family History:  The patient's family history includes Hypertension in her father, sister, sister, and sister; Kidney disease in her  father.    ROS:  Please see the history of present illness.    Review of Systems: Constitutional:  denies fever, chills, diaphoresis, appetite change and fatigue.  HEENT: denies photophobia, eye pain, redness, hearing loss, ear pain, congestion, sore throat, rhinorrhea, sneezing, neck pain, neck stiffness and tinnitus.  Respiratory: denies SOB, DOE, cough, chest tightness, and wheezing.  Cardiovascular: denies chest pain, palpitations and leg swelling.  Gastrointestinal: denies nausea, vomiting, abdominal pain, diarrhea, constipation, blood in stool.  Genitourinary: denies dysuria, urgency, frequency, hematuria, flank pain and difficulty urinating.  Musculoskeletal: denies  myalgias, back pain, joint swelling, arthralgias and gait problem.   Skin: denies pallor, rash and wound.  Neurological: denies dizziness, seizures, syncope, weakness, light-headedness, numbness and headaches.   Hematological: denies adenopathy, easy bruising, personal or family bleeding history.  Psychiatric/ Behavioral: denies suicidal ideation, mood changes, confusion, nervousness, sleep disturbance and agitation.       All other systems are reviewed and negative.    PHYSICAL EXAM: VS:  BP 140/84 (BP Location: Left Arm, Patient Position: Sitting, Cuff Size: Normal)   Pulse 61   Ht 5\' 11"  (1.803 m)   Wt 212 lb 12.8 oz (96.5 kg)   SpO2 98%   BMI 29.68 kg/m  , BMI Body mass index is 29.68 kg/m. GEN: Well nourished, well developed, in no acute distress  HEENT: normal  Neck: no JVD, carotid bruits, or masses Cardiac: RRR; 2/6 systolic murmur, rubs, or gallops, trace lege edema  Respiratory:  clear to auscultation bilaterally, normal work of breathing GI: soft, nontender, nondistended, + BS MS: no deformity or atrophy , some mild chronic stasis changes.  Skin: warm and dry, no rash Neuro:  Strength and sensation are intact Psych: normal   EKG:  EKG is ordered today. April 07, 2016:   NSR at 45.    Otherwise normal    Recent Labs: 10/26/2015: Magnesium 1.7; TSH 2.02 03/14/2016: ALT 6; Brain Natriuretic Peptide 168.7; BUN 14; Creat 1.13; Hemoglobin 10.1; Platelets 264; Potassium 4.2; Sodium 138    Lipid Panel    Component Value Date/Time   CHOL 188 10/26/2015 0925   TRIG 53 10/26/2015 0925   HDL 135 10/26/2015 0925   CHOLHDL 1.4 10/26/2015 0925   VLDL 11 10/26/2015 0925   LDLCALC 42 10/26/2015 0925      Wt Readings from Last 3 Encounters:  04/07/16 212 lb 12.8 oz (96.5 kg)  03/14/16 221 lb 9.6 oz (100.5 kg)  10/26/15 217 lb 3.2 oz (98.5 kg)      Other studies Reviewed: Additional studies/ records that were reviewed today include: . Review of the  above records demonstrates:    ASSESSMENT AND PLAN:  1. Hypertension - BP is well controlled .  Continue current meds.   2. Mitral regurgitation - mild - moderate , stable   3. Cerebral aneurysm- s/p repair    Current medicines are reviewed at length with the patient today.  The patient does not have concerns regarding medicines.  The following changes have been made:  no change   Disposition:   FU with me in 1 year     Signed, Mertie Moores, MD  04/07/2016 2:46 PM    [facility] [place], [place], [place]  [address] Phone: [phone]; Fax: [phone]

## 2016-04-07 NOTE — Patient Instructions (Signed)
Medication Instructions:  Your physician recommends that you continue on your current medications as directed. Please refer to the Current Medication list given to you today.   Labwork: None Ordered   Testing/Procedures: None Ordered   Follow-Up: Your physician wants you to follow-up in: 1 year with Dr. Nahser.  You will receive a reminder letter in the mail two months in advance. If you don't receive a letter, please call our office to schedule the follow-up appointment.   If you need a refill on your cardiac medications before your next appointment, please call your pharmacy.   Thank you for choosing [facility]! Emily Forse, RN [phone]    

## 2016-04-12 ENCOUNTER — Encounter: Payer: Self-pay | Admitting: Physician Assistant

## 2016-04-12 ENCOUNTER — Ambulatory Visit (INDEPENDENT_AMBULATORY_CARE_PROVIDER_SITE_OTHER): Payer: BLUE CROSS/BLUE SHIELD | Admitting: Physician Assistant

## 2016-04-12 VITALS — BP 120/80 | HR 86 | Temp 97.3°F | Resp 16 | Ht 71.0 in | Wt 217.2 lb

## 2016-04-12 DIAGNOSIS — Z79899 Other long term (current) drug therapy: Secondary | ICD-10-CM

## 2016-04-12 DIAGNOSIS — Z9989 Dependence on other enabling machines and devices: Secondary | ICD-10-CM

## 2016-04-12 DIAGNOSIS — G4733 Obstructive sleep apnea (adult) (pediatric): Secondary | ICD-10-CM

## 2016-04-12 DIAGNOSIS — I5032 Chronic diastolic (congestive) heart failure: Secondary | ICD-10-CM | POA: Diagnosis not present

## 2016-04-12 NOTE — Progress Notes (Signed)
Assessment and Plan: 1. Chronic diastolic heart failure (HCC) Continue lasix for now - BASIC METABOLIC PANEL WITH GFR  2. OSA on CPAP Continue CPAP  3. Medication management - Magnesium  4. GERD Follow up GI, get on zantac  Future Appointments Date Time Provider [place]  05/02/2016 8:45 AM Vicie Mutters, PA-C [facility] None  10/30/2016 9:00 AM Vicie Mutters, PA-C [facility] None    HPI 61 y.o.female presents for 1 month follow up. Last visit her weight was up 4 lbs, she had edema, and she was started on lasix 40mg  dialy for cough, weight is down 4 lbs. She also needed tubing/mask for CPAP, but that was fixed, and she is now using CPAP nightly for at least 7 hours and states it is helping as well.    Wt Readings from Last 3 Encounters:  04/12/16 217 lb 3.2 oz (98.5 kg)  04/07/16 212 lb 12.8 oz (96.5 kg)  03/14/16 221 lb 9.6 oz (100.5 kg)    Past Medical History:  Diagnosis Date  . Arthritis   . Chronic diastolic heart failure ([place])   . GERD (gastroesophageal reflux disease)   . Heart murmur   . Hyperlipidemia   . Hypertension may, 1973  . Mitral regurgitation    a. Echo 01/2012 mild LVH, EF 55-65%, Gr 2 DD, mod MR, mild LAE, PASP 36;  b. Echo (02/2013):  EF 55-60%, Gr 1 DD, mild to mod MR, mild LAE (no sig change since prior echo)  . OSA on CPAP    7 yrs  . Peripheral vascular disease ([place])    cerebral aneursym  . Stroke ([place]) 07   no weakness  . Subarachnoid hemorrhage due to ruptured aneurysm ([place])    2003 - s/p coiling  . Unspecified vitamin D deficiency      Allergies  Allergen Reactions  . Aspirin Nausea And Vomiting  . Ace Inhibitors Nausea And Vomiting    Current Outpatient Prescriptions on File Prior to Visit  Medication Sig  . amLODipine-valsartan (EXFORGE) 10-320 MG tablet Take 1 tablet by mouth daily.  Marland Kitchen aspirin EC 81 MG tablet Take 1 tablet (81 mg total) by mouth daily.  . colchicine 0.6 MG tablet Take 1 tablet (0.6 mg total) by  mouth daily.  . furosemide (LASIX) 40 MG tablet Take 1 tablet (40 mg total) by mouth daily.  . Multiple Vitamins-Minerals (MULTIVITAMIN WITH MINERALS) tablet Take 1 tablet by mouth daily.  . Omega-3 Fatty Acids (FISH OIL) 1200 MG CAPS Take 2,400 mg by mouth daily.   . pantoprazole (PROTONIX) 40 MG tablet Take 1 tablet (40 mg total) by mouth daily.  . potassium chloride (K-DUR) 10 MEQ tablet TAKE ONE TABLET BY MOUTH ONCE DAILY  . PREMARIN 0.9 MG tablet TAKE ONE TABLET BY MOUTH ONCE DAILY (NEED  OFFICE  VISIT  BEFORE  FURTHER  REFILLS).   No current facility-administered medications on file prior to visit.     ROS: all negative except above.   Physical Exam: Filed Weights   04/12/16 1548  Weight: 217 lb 3.2 oz (98.5 kg)   BP 120/80   Pulse 86   Temp 97.3 F (36.3 C)   Resp 16   Ht 5\' 11"  (1.803 m)   Wt 217 lb 3.2 oz (98.5 kg)   SpO2 97%   BMI 30.29 kg/m  General Appearance: Well nourished, in no apparent distress. Eyes: PERRLA, EOMs, conjunctiva no swelling or erythema Sinuses: No Frontal/maxillary tenderness ENT/Mouth: Ext aud canals clear, TMs without  erythema, bulging. No erythema, swelling, or exudate on post pharynx.  Tonsils not swollen or erythematous. Hearing normal.  Neck: Supple, thyroid normal.  Respiratory: Respiratory effort normal, BS equal bilaterally without rales, rhonchi, wheezing or stridor.  Cardio: RRR with no MRGs. Brisk peripheral pulses without edema.  Abdomen: Soft, + BS.  Non tender, no guarding, rebound, hernias, masses. Lymphatics: Non tender without lymphadenopathy.  Musculoskeletal: Full ROM, 5/5 strength, normal gait.  Skin: Warm, dry without rashes, lesions, ecchymosis.  Neuro: Cranial nerves intact. Normal muscle tone, no cerebellar symptoms. Sensation intact.  Psych: Awake and oriented X 3, normal affect, Insight and Judgment appropriate.     Vicie Mutters, PA-C 3:54 PM [facility]

## 2016-04-12 NOTE — Patient Instructions (Signed)
Continue protonix but add zantac 150-300 at night   Food Choices for Gastroesophageal Reflux Disease, Adult When you have gastroesophageal reflux disease (GERD), the foods you eat and your eating habits are very important. Choosing the right foods can help ease the discomfort of GERD. Consider working with a diet and nutrition specialist (dietitian) to help you make healthy food choices. What general guidelines should I follow? Eating plan   Choose healthy foods low in fat, such as fruits, vegetables, whole grains, low-fat dairy products, and lean meat, fish, and poultry.  Eat frequent, small meals instead of three large meals each day. Eat your meals slowly, in a relaxed setting. Avoid bending over or lying down until 2-3 hours after eating.  Limit high-fat foods such as fatty meats or fried foods.  Limit your intake of oils, butter, and shortening to less than 8 teaspoons each day.  Avoid the following:  Foods that cause symptoms. These may be different for different people. Keep a food diary to keep track of foods that cause symptoms.  Alcohol.  Drinking large amounts of liquid with meals.  Eating meals during the 2-3 hours before bed.  Cook foods using methods other than frying. This may include baking, grilling, or broiling. Lifestyle    Maintain a healthy weight. Ask your health care provider what weight is healthy for you. If you need to lose weight, work with your health care provider to do so safely.  Exercise for at least 30 minutes on 5 or more days each week, or as told by your health care provider.  Avoid wearing clothes that fit tightly around your waist and chest.  Do not use any products that contain nicotine or tobacco, such as cigarettes and e-cigarettes. If you need help quitting, ask your health care provider.  Sleep with the head of your bed raised. Use a wedge under the mattress or blocks under the bed frame to raise the head of the bed. What foods are not  recommended? The items listed may not be a complete list. Talk with your dietitian about what dietary choices are best for you. Grains  Pastries or quick breads with added fat. [place] toast. Vegetables  Deep fried vegetables. [place] fries. Any vegetables prepared with added fat. Any vegetables that cause symptoms. For some people this may include tomatoes and tomato products, chili peppers, onions and garlic, and horseradish. Fruits  Any fruits prepared with added fat. Any fruits that cause symptoms. For some people this may include citrus fruits, such as oranges, grapefruit, pineapple, and lemons. Meats and other protein foods  High-fat meats, such as fatty beef or pork, hot dogs, ribs, ham, sausage, salami and bacon. Fried meat or protein, including fried fish and fried chicken. Nuts and nut butters. Dairy  Whole milk and chocolate milk. Sour cream. Cream. Ice cream. Cream cheese. Milk shakes. Beverages  Coffee and tea, with or without caffeine. Carbonated beverages. Sodas. Energy drinks. Fruit juice made with acidic fruits (such as orange or grapefruit). Tomato juice. Alcoholic drinks. Fats and oils  Butter. Margarine. Shortening. Ghee. Sweets and desserts  Chocolate and cocoa. Donuts. Seasoning and other foods  Pepper. Peppermint and spearmint. Any condiments, herbs, or seasonings that cause symptoms. For some people, this may include curry, hot sauce, or vinegar-based salad dressings. Summary  When you have gastroesophageal reflux disease (GERD), food and lifestyle choices are very important to help ease the discomfort of GERD.  Eat frequent, small meals instead of three large meals each day. Eat  your meals slowly, in a relaxed setting. Avoid bending over or lying down until 2-3 hours after eating.  Limit high-fat foods such as fatty meat or fried foods. This information is not intended to replace advice given to you by your health care provider. Make sure you discuss any questions  you have with your health care provider. Document Released: 01/09/2005 Document Revised: 01/11/2016 Document Reviewed: 01/11/2016 Elsevier Interactive Patient Education  2017 [employer].

## 2016-04-13 LAB — BASIC METABOLIC PANEL WITH GFR
BUN: 14 mg/dL (ref 7–25)
CALCIUM: 9.6 mg/dL (ref 8.6–10.4)
CO2: 27 mmol/L (ref 20–31)
Chloride: 104 mmol/L (ref 98–110)
Creat: 1.02 mg/dL — ABNORMAL HIGH (ref 0.50–0.99)
GFR, EST AFRICAN AMERICAN: 69 mL/min (ref >=60)
GFR, EST NON AFRICAN AMERICAN: 60 mL/min (ref >=60)
GLUCOSE: 73 mg/dL (ref 65–99)
Potassium: 4.1 mmol/L (ref 3.5–5.3)
Sodium: 140 mmol/L (ref 135–146)

## 2016-04-13 LAB — MAGNESIUM: MAGNESIUM: 1.5 mg/dL (ref 1.5–2.5)

## 2016-04-13 NOTE — Progress Notes (Signed)
Pt aware of lab results & voiced understanding of those results.

## 2016-04-13 NOTE — Progress Notes (Signed)
LVM for pt to return office call for LAB results.

## 2016-04-19 ENCOUNTER — Telehealth: Payer: Self-pay | Admitting: Internal Medicine

## 2016-04-19 NOTE — Telephone Encounter (Signed)
-----   Message from Vicie Mutters, [place] sent at 03/14/2016  3:58 PM EST ----- Needs tubing and mask, no DME

## 2016-04-19 NOTE — Telephone Encounter (Signed)
DME for CPAP supplies to [place]

## 2016-05-01 ENCOUNTER — Encounter: Payer: Self-pay | Admitting: *Deleted

## 2016-05-02 ENCOUNTER — Ambulatory Visit: Payer: Self-pay | Admitting: Physician Assistant

## 2016-06-14 ENCOUNTER — Other Ambulatory Visit: Payer: Self-pay | Admitting: Cardiovascular Disease

## 2016-06-26 ENCOUNTER — Other Ambulatory Visit: Payer: Self-pay | Admitting: Physician Assistant

## 2016-07-04 DIAGNOSIS — G4733 Obstructive sleep apnea (adult) (pediatric): Secondary | ICD-10-CM | POA: Diagnosis not present

## 2016-07-21 ENCOUNTER — Ambulatory Visit: Payer: Self-pay | Admitting: Physician Assistant

## 2016-08-07 ENCOUNTER — Other Ambulatory Visit: Payer: Self-pay | Admitting: Internal Medicine

## 2016-08-25 ENCOUNTER — Other Ambulatory Visit (HOSPITAL_COMMUNITY): Payer: Self-pay | Admitting: Interventional Radiology

## 2016-08-30 ENCOUNTER — Other Ambulatory Visit (HOSPITAL_COMMUNITY): Payer: Self-pay | Admitting: Interventional Radiology

## 2016-08-30 DIAGNOSIS — I671 Cerebral aneurysm, nonruptured: Secondary | ICD-10-CM

## 2016-09-01 ENCOUNTER — Other Ambulatory Visit: Payer: Self-pay | Admitting: Physician Assistant

## 2016-09-01 ENCOUNTER — Other Ambulatory Visit: Payer: Self-pay | Admitting: Student

## 2016-09-01 ENCOUNTER — Other Ambulatory Visit: Payer: Self-pay | Admitting: Radiology

## 2016-09-04 ENCOUNTER — Encounter (HOSPITAL_COMMUNITY): Payer: Self-pay

## 2016-09-04 ENCOUNTER — Ambulatory Visit (HOSPITAL_COMMUNITY)
Admission: RE | Admit: 2016-09-04 | Discharge: 2016-09-04 | Disposition: A | Discharge status: Home / Self Care | Payer: BLUE CROSS/BLUE SHIELD | Source: Ambulatory Visit | Attending: Interventional Radiology | Admitting: Interventional Radiology

## 2016-09-04 ENCOUNTER — Telehealth: Payer: Self-pay | Admitting: Cardiology

## 2016-09-04 ENCOUNTER — Other Ambulatory Visit (HOSPITAL_COMMUNITY): Payer: Self-pay | Admitting: Interventional Radiology

## 2016-09-04 DIAGNOSIS — K219 Gastro-esophageal reflux disease without esophagitis: Secondary | ICD-10-CM | POA: Insufficient documentation

## 2016-09-04 DIAGNOSIS — G4733 Obstructive sleep apnea (adult) (pediatric): Secondary | ICD-10-CM | POA: Insufficient documentation

## 2016-09-04 DIAGNOSIS — I110 Hypertensive heart disease with heart failure: Secondary | ICD-10-CM | POA: Diagnosis not present

## 2016-09-04 DIAGNOSIS — Z8673 Personal history of transient ischemic attack (TIA), and cerebral infarction without residual deficits: Secondary | ICD-10-CM | POA: Diagnosis not present

## 2016-09-04 DIAGNOSIS — I739 Peripheral vascular disease, unspecified: Secondary | ICD-10-CM | POA: Insufficient documentation

## 2016-09-04 DIAGNOSIS — R51 Headache: Secondary | ICD-10-CM | POA: Diagnosis not present

## 2016-09-04 DIAGNOSIS — Z87891 Personal history of nicotine dependence: Secondary | ICD-10-CM | POA: Diagnosis not present

## 2016-09-04 DIAGNOSIS — Z8249 Family history of ischemic heart disease and other diseases of the circulatory system: Secondary | ICD-10-CM | POA: Diagnosis not present

## 2016-09-04 DIAGNOSIS — E785 Hyperlipidemia, unspecified: Secondary | ICD-10-CM | POA: Diagnosis not present

## 2016-09-04 DIAGNOSIS — I5032 Chronic diastolic (congestive) heart failure: Secondary | ICD-10-CM | POA: Diagnosis not present

## 2016-09-04 DIAGNOSIS — I671 Cerebral aneurysm, nonruptured: Secondary | ICD-10-CM | POA: Diagnosis not present

## 2016-09-04 DIAGNOSIS — I340 Nonrheumatic mitral (valve) insufficiency: Secondary | ICD-10-CM | POA: Insufficient documentation

## 2016-09-04 DIAGNOSIS — M1990 Unspecified osteoarthritis, unspecified site: Secondary | ICD-10-CM | POA: Diagnosis not present

## 2016-09-04 DIAGNOSIS — E559 Vitamin D deficiency, unspecified: Secondary | ICD-10-CM | POA: Insufficient documentation

## 2016-09-04 HISTORY — PX: IR ANGIO VERTEBRAL SEL VERTEBRAL BILAT MOD SED: IMG5369

## 2016-09-04 HISTORY — PX: IR ANGIO INTRA EXTRACRAN SEL COM CAROTID INNOMINATE BILAT MOD SED: IMG5360

## 2016-09-04 HISTORY — PX: IR ANGIOGRAM EXTREMITY LEFT: IMG651

## 2016-09-04 HISTORY — PX: IR 3D INDEPENDENT WKST: IMG2385

## 2016-09-04 LAB — CBC
HEMATOCRIT: 32.5 % — AB (ref 36.0–46.0)
Hemoglobin: 10.4 g/dL — ABNORMAL LOW (ref 12.0–15.0)
MCH: 26.9 pg (ref 26.0–34.0)
MCHC: 32 g/dL (ref 30.0–36.0)
MCV: 84.2 fL (ref 78.0–100.0)
PLATELETS: 292 10*3/uL (ref 150–400)
RBC: 3.86 MIL/uL — ABNORMAL LOW (ref 3.87–5.11)
RDW: 15.3 % (ref 11.5–15.5)
WBC: 9.6 10*3/uL (ref 4.0–10.5)

## 2016-09-04 LAB — BASIC METABOLIC PANEL
Anion gap: 9 (ref 5–15)
BUN: 14 mg/dL (ref 6–20)
CO2: 26 mmol/L (ref 22–32)
CREATININE: 1 mg/dL (ref 0.44–1.00)
Calcium: 9.5 mg/dL (ref 8.9–10.3)
Chloride: 103 mmol/L (ref 101–111)
GFR calc Af Amer: >60 mL/min (ref >60)
GFR, EST NON AFRICAN AMERICAN: 60 mL/min — AB (ref >60)
GLUCOSE: 109 mg/dL — AB (ref 65–99)
POTASSIUM: 3.8 mmol/L (ref 3.5–5.1)
Sodium: 138 mmol/L (ref 135–145)

## 2016-09-04 LAB — PROTIME-INR
INR: 0.9
Prothrombin Time: 12.1 seconds (ref 11.4–15.2)

## 2016-09-04 LAB — APTT: APTT: 32 s (ref 24–36)

## 2016-09-04 MED ORDER — HEPARIN SODIUM (PORCINE) 1000 UNIT/ML IJ SOLN
INTRAMUSCULAR | Status: AC
  Filled 2016-09-04: qty 2

## 2016-09-04 MED ORDER — FENTANYL CITRATE (PF) 100 MCG/2ML IJ SOLN
INTRAMUSCULAR | Status: AC | PRN
  Administered 2016-09-04 (×2): 25 ug via INTRAVENOUS

## 2016-09-04 MED ORDER — SODIUM CHLORIDE 0.9 % IV SOLN: INTRAVENOUS | Status: AC

## 2016-09-04 MED ORDER — MIDAZOLAM HCL 2 MG/2ML IJ SOLN
INTRAMUSCULAR | Status: AC | PRN
  Administered 2016-09-04: 1 mg via INTRAVENOUS
  Administered 2016-09-04: 0.5 mg via INTRAVENOUS

## 2016-09-04 MED ORDER — IOPAMIDOL (ISOVUE-300) INJECTION 61%
INTRAVENOUS | Status: AC
  Administered 2016-09-04: 75 mL
  Filled 2016-09-04: qty 50

## 2016-09-04 MED ORDER — HEPARIN SODIUM (PORCINE) 1000 UNIT/ML IJ SOLN
INTRAMUSCULAR | Status: AC | PRN
  Administered 2016-09-04: 500 [IU] via INTRAVENOUS
  Administered 2016-09-04: 1000 [IU] via INTRAVENOUS

## 2016-09-04 MED ORDER — FENTANYL CITRATE (PF) 100 MCG/2ML IJ SOLN
INTRAMUSCULAR | Status: AC
  Filled 2016-09-04: qty 2

## 2016-09-04 MED ORDER — IOPAMIDOL (ISOVUE-300) INJECTION 61%
INTRAVENOUS | Status: AC
  Administered 2016-09-04: 21 mL
  Filled 2016-09-04: qty 100

## 2016-09-04 MED ORDER — IOPAMIDOL (ISOVUE-300) INJECTION 61%
INTRAVENOUS | Status: AC
  Administered 2016-09-04: 25 mL
  Filled 2016-09-04: qty 150

## 2016-09-04 MED ORDER — LIDOCAINE HCL (PF) 1 % IJ SOLN
INTRAMUSCULAR | Status: AC
  Filled 2016-09-04: qty 30

## 2016-09-04 MED ORDER — SODIUM CHLORIDE 0.9 % IV SOLN
INTRAVENOUS | Status: AC | PRN
  Administered 2016-09-04: 75 mL/h via INTRAVENOUS

## 2016-09-04 MED ORDER — SODIUM CHLORIDE 0.9 % IV SOLN: INTRAVENOUS | Status: DC

## 2016-09-04 MED ORDER — LIDOCAINE HCL 1 % IJ SOLN
INTRAMUSCULAR | Status: AC | PRN
  Administered 2016-09-04: 10 mL

## 2016-09-04 MED ORDER — MIDAZOLAM HCL 2 MG/2ML IJ SOLN
INTRAMUSCULAR | Status: AC
  Filled 2016-09-04: qty 2

## 2016-09-04 NOTE — Sedation Documentation (Signed)
5 Fr. Etta Grandchild to right groin

## 2016-09-04 NOTE — Telephone Encounter (Signed)
Pt was admitted to [facility] OP for a procedure and I was contacted by the surgical PA and asked to arrange an OP f/u with Dr Acie Fredrickson or an APP. Pt apparently has a "loud" murmur on exam. Past echo showed mild MR. Pt is asymptomatic.   Kerin Ransom PA-C 09/04/2016 9:16 AM

## 2016-09-04 NOTE — Procedures (Signed)
S/P 4 vessel cerebral arteriogram RT CFA approach. Findings. 1approx 3.39mmx 73mm LT superior hypophyseal aneurysm. 2.aprox 2.6 mmx 2.10mm LT ICA ophthalmic artery origin aneurysmal bulge. 3.Obliterated RT ICA intracranial superior hypophyseal  Aneurysm with patent flow diverter.

## 2016-09-04 NOTE — Discharge Instructions (Addendum)
Cerebral Angiogram, Care After °Refer to this sheet in the next few weeks. These instructions provide you with information on caring for yourself after your procedure. Your health care provider may also give you more specific instructions. Your treatment has been planned according to current medical practices, but problems sometimes occur. Call your health care provider if you have any problems or questions after your procedure. °What can I expect after the procedure? °After your procedure, it is typical to have the following: °· Bruising at the catheter insertion site that usually fades within 1-2 weeks. °· Blood collecting in the tissue (hematoma) that may be painful to the touch. It should usually decrease in size and tenderness within 1-2 weeks. °· A mild headache. ° °Follow these instructions at home: °· Take medicines only as directed by your health care provider. °· You may shower 24-48 hours after the procedure or as directed by your health care provider. Remove the bandage (dressing) and gently wash the site with plain soap and water. Pat the area dry with a clean towel. Do not rub the site, because this may cause bleeding. °· Do not take baths, swim, or use a hot tub until your health care provider approves. °· Check your insertion site every day for redness, swelling, or drainage. °· Do not apply powder or lotion to the site. °· Do not lift over 10 lb (4.5 kg) for 5 days after your procedure or as directed by your health care provider. °· Ask your health care provider when it is okay to: °? Return to work or school. °? Resume usual physical activities or sports. °? Resume sexual activity. °· Do not drive home if you are discharged the same day as the procedure. Have someone else drive you. °· You may drive 24 hours after the procedure unless otherwise instructed by your health care provider. °· Do not operate machinery or power tools for 24 hours after the procedure or as directed by your health care  provider. °· If your procedure was done as an outpatient procedure, which means that you went home the same day as your procedure, a responsible adult should be with you for the first 24 hours after you arrive home. °· Keep all follow-up visits as directed by your health care provider. This is important. °Contact a health care provider if: °· You have a fever. °· You have chills. °· You have increased bleeding from the catheter insertion site. Hold pressure on the site. °Get help right away if: °· You have vision changes or loss of vision. °· You have numbness or weakness on one side of your body. °· You have difficulty talking, or you have slurred speech or cannot speak (aphasia). °· You feel confused or have difficulty remembering. °· You have unusual pain at the catheter insertion site. °· You have redness, warmth, or swelling at the catheter insertion site. °· You have drainage (other than a small amount of blood on the dressing) from the catheter insertion site. °· The catheter insertion site is bleeding, and the bleeding does not stop after 30 minutes of holding steady pressure on the site. °These symptoms may represent a serious problem that is an emergency. Do not wait to see if the symptoms will go away. Get medical help right away. Call your local emergency services (911 in [place] Do not drive yourself to the hospital. °This information is not intended to replace advice given to you by your health care provider. Make sure you discuss any questions   you have with your health care provider. °Document Released: 05/26/2013 Document Revised: 06/17/2015 Document Reviewed: 01/22/2013 °Elsevier Interactive Patient Education © 2017 Elsevier Inc. °Moderate Conscious Sedation, Adult, Care After °These instructions provide you with information about caring for yourself after your procedure. Your health care provider may also give you more specific instructions. Your treatment has been planned according to current  medical practices, but problems sometimes occur. Call your health care provider if you have any problems or questions after your procedure. °What can I expect after the procedure? °After your procedure, it is common: °· To feel sleepy for several hours. °· To feel clumsy and have poor balance for several hours. °· To have poor judgment for several hours. °· To vomit if you eat too soon. ° °Follow these instructions at home: °For at least 24 hours after the procedure: ° °· Do not: °? Participate in activities where you could fall or become injured. °? Drive. °? Use heavy machinery. °? Drink alcohol. °? Take sleeping pills or medicines that cause drowsiness. °? Make important decisions or sign legal documents. °? Take care of children on your own. °· Rest. °Eating and drinking °· Follow the diet recommended by your health care provider. °· If you vomit: °? Drink water, juice, or soup when you can drink without vomiting. °? Make sure you have little or no nausea before eating solid foods. °General instructions °· Have a responsible adult stay with you until you are awake and alert. °· Take over-the-counter and prescription medicines only as told by your health care provider. °· If you smoke, do not smoke without supervision. °· Keep all follow-up visits as told by your health care provider. This is important. °Contact a health care provider if: °· You keep feeling nauseous or you keep vomiting. °· You feel light-headed. °· You develop a rash. °· You have a fever. °Get help right away if: °· You have trouble breathing. °This information is not intended to replace advice given to you by your health care provider. Make sure you discuss any questions you have with your health care provider. °Document Released: 10/30/2012 Document Revised: 06/14/2015 Document Reviewed: 05/01/2015 °Elsevier Interactive Patient Education © 2018 Elsevier Inc. ° °

## 2016-09-04 NOTE — H&P (Signed)
Chief Complaint: headaches, history of cerebral aneurysm  Supervising Physician: Luanne Bras  Patient Status: [facility] - Out-pt  HPI: Yolanda Espinoza is a 61 y.o. female with a history of a cerebral aneurysm that Dr. Estanislado Pandy treated in the early 2000s.  She has been followed and has been stable.  Over the last several weeks she has developed new right sided headaches.  She denies any other symptoms such as vision changes, chest pain, SOB, fevers, chills, etc.  She presents today for a diagnostic cerebral angiogram to further evaluation her new headaches.  Past Medical History:  Past Medical History:  Diagnosis Date  . Arthritis   . Chronic diastolic heart failure ([place])   . GERD (gastroesophageal reflux disease)   . Heart murmur   . Hyperlipidemia   . Hypertension may, 1973  . Mitral regurgitation    a. Echo 01/2012 mild LVH, EF 55-65%, Gr 2 DD, mod MR, mild LAE, PASP 36;  b. Echo (02/2013):  EF 55-60%, Gr 1 DD, mild to mod MR, mild LAE (no sig change since prior echo)  . OSA on CPAP    7 yrs  . Peripheral vascular disease ([place])    cerebral aneursym  . Stroke ([place]) 07   no weakness  . Subarachnoid hemorrhage due to ruptured aneurysm ([place])    2003 - s/p coiling  . Unspecified vitamin D deficiency     Past Surgical History:  Past Surgical History:  Procedure Laterality Date  . ABDOMINAL HYSTERECTOMY    . ANEURYSM COILING    . head surgery     aneurysm ruptured- stroke  . RADIOLOGY WITH ANESTHESIA N/A 04/23/2013   Procedure: RADIOLOGY WITH ANESTHESIA;  Surgeon: Rob Hickman, MD;  Location: [facility];  Service: Radiology;  Laterality: N/A;  . TONSILLECTOMY AND ADENOIDECTOMY      Family History:  Family History  Problem Relation Age of Onset  . Hypertension Father   . Kidney disease Father   . Hypertension Sister   . Hypertension Sister   . Hypertension Sister     Social History:  reports that she quit smoking about 17 years ago. Her smoking use included  Cigarettes. She has a 0.75 pack-year smoking history. She has never used smokeless tobacco. She reports that she does not drink alcohol or use drugs.  Allergies:  Allergies  Allergen Reactions  . Aspirin Nausea And Vomiting  . Ace Inhibitors Nausea And Vomiting    Medications: Medications reviewed in epic  Please HPI for pertinent positives, otherwise complete 10 system ROS negative.  Mallampati Score: MD Evaluation Airway: WNL Heart: Other (comments) Heart  comments: loud murmur/rub Abdomen: WNL Chest/ Lungs: WNL ASA  Classification: 2 Mallampati/Airway Score: Two  Physical Exam: BP (!) 172/84   Pulse 79   Temp 97.7 F (36.5 C) (Oral)   Resp 15   Ht '5\' 10"'  (1.778 m)   Wt 225 lb (102.1 kg)   SpO2 100%   BMI 32.28 kg/m  Body mass index is 32.28 kg/m. General: pleasant, WD, WN black female who is laying in bed in NAD HEENT: head is normocephalic, atraumatic.  Sclera are noninjected.  PERRL.  Ears and nose without any masses or lesions.  Mouth is pink and moist Heart: regular, rate, and rhythm.  Normal s1,s2. Very loud systolic murmur and rub.  The rub does not dissipate with breath holding.  Palpable radial pulses bilaterally Lungs: CTAB, no wheezes, rhonchi, or rales noted.  Respiratory effort nonlabored Abd: soft, NT, obese, +  BS, no masses, hernias, or organomegaly Psych: A&Ox3 with an appropriate affect.   Labs: Results for orders placed or performed during the hospital encounter of 09/04/16 (from the past 48 hour(s))  APTT upon arrival     Status: None   Collection Time: 09/04/16  6:29 AM  Result Value Ref Range   aPTT 32 24 - 36 seconds  CBC upon arrival     Status: Abnormal   Collection Time: 09/04/16  6:29 AM  Result Value Ref Range   WBC 9.6 4.0 - 10.5 K/uL   RBC 3.86 (L) 3.87 - 5.11 MIL/uL   Hemoglobin 10.4 (L) 12.0 - 15.0 g/dL   HCT 32.5 (L) 36.0 - 46.0 %   MCV 84.2 78.0 - 100.0 fL   MCH 26.9 26.0 - 34.0 pg   MCHC 32.0 30.0 - 36.0 g/dL   RDW 15.3  11.5 - 15.5 %   Platelets 292 150 - 400 K/uL  Protime-INR upon arrival     Status: None   Collection Time: 09/04/16  6:29 AM  Result Value Ref Range   Prothrombin Time 12.1 11.4 - 15.2 seconds   INR 1.77   Basic metabolic panel     Status: Abnormal   Collection Time: 09/04/16  6:29 AM  Result Value Ref Range   Sodium 138 135 - 145 mmol/L   Potassium 3.8 3.5 - 5.1 mmol/L   Chloride 103 101 - 111 mmol/L   CO2 26 22 - 32 mmol/L   Glucose, Bld 109 (H) 65 - 99 mg/dL   BUN 14 6 - 20 mg/dL   Creatinine, Ser 1.00 0.44 - 1.00 mg/dL   Calcium 9.5 8.9 - 10.3 mg/dL   GFR calc non Af Amer 60 (L) >60 mL/min   GFR calc Af Amer >60 >60 mL/min    Comment: (NOTE) The eGFR has been calculated using the CKD EPI equation. This calculation has not been validated in all clinical situations. eGFR's persistently <60 mL/min signify possible Chronic Kidney Disease.    Anion gap 9 5 - 15    Imaging: No results found.  Assessment/Plan 1. Headaches, history of cerebral aneurysm with treatment  We will proceed with a diagnostic cerebral angiogram today to better evaluate her headaches and to clarify whether she has any new changes that would require intervention.  Risks and benefits discussed with the patient including, but not limited to bleeding, infection, vascular injury or contrast induced renal failure. All of the patient's questions were answered, patient is agreeable to proceed. Consent signed and in chart.  2. Heart murmur/rub  The patient has a history of a murmur for which she has seen Dr. Acie Fredrickson in the past, March 2018.  She had an EKG that was essentially normal.  Her last ECHO was in 2015 which revealed mild to moderate MV regurg.  Dr. Acie Fredrickson states her murmur is 2/6.  It is very loud today with a grating noise concerning for a rub; however the patient is completely asymptomatic.  We have contacted cardiology and will have the patient set up within the next week for a follow up to better  assess this new change.  Dr. Estanislado Pandy has evaluated the patient and feels that she definitely has a change from what was last documented, but given she is completely asymptomatic, we can proceed with her procedure today.  Thank you for this interesting consult.  I greatly enjoyed meeting BUFFI EWTON and look forward to participating in their care.  A copy of this  report was sent to the requesting provider on this date.  Electronically Signed: Henreitta Cea 09/04/2016, 9:19 AM   I spent a total of    40 Minutes in face to face in clinical consultation, greater than 50% of which was counseling/coordinating care for headaches, cardiac murmur/rub

## 2016-09-04 NOTE — Sedation Documentation (Signed)
[id number] removed per Dr. Deveshwar  

## 2016-09-05 ENCOUNTER — Encounter (HOSPITAL_COMMUNITY): Payer: Self-pay | Admitting: Interventional Radiology

## 2016-09-05 ENCOUNTER — Other Ambulatory Visit (HOSPITAL_COMMUNITY): Payer: Self-pay | Admitting: Interventional Radiology

## 2016-09-05 DIAGNOSIS — I729 Aneurysm of unspecified site: Secondary | ICD-10-CM

## 2016-09-05 NOTE — Progress Notes (Signed)
6 month OV  Assessment and Plan: Essential hypertension - continue medications, DASH diet, exercise and monitor at home. Call if greater than 130/80.  - CBC with Differential/Platelet - BASIC METABOLIC PANEL WITH GFR - Hepatic function panel - TSH  Hyperlipidemia -continue medications, check lipids, decrease fatty foods, increase activity. - Lipid panel  Obesity Obesity with co morbidities- long discussion about weight loss, diet, and exercise  Chronic diastolic heart failure decrease salt/sugar, monitor weight, BP stable.   Vitamin D deficiency - Vit D  25 hydroxy (rtn osteoporosis monitoring)  Medication management - Magnesium  OSA on CPAP  weight loss advised.  Very important with heart/CVA history to STAY on CPAP CPAP is helping sleep and weight loss   Brain aneurysm Control blood pressure, cholesterol, glucose, increase exercise.  Has follow up neurosurgery August 29th  Discussed med's effects and SE's. Screening labs and tests as requested with regular follow-up as recommended. Over 30 minutes of exam, counseling, chart review, and complex, high level critical decision making was performed this visit.  Future Appointments Date Time Provider [place]  09/20/2016 2:00 PM [facility] 2 [facility]  09/29/2016 12:15 PM Richardson Dopp T, PA-C [facility]  10/30/2016 9:00 AM Vicie Mutters, PA-C [facility] None    HPI  61 y.o. AA female  presents for follow up OV for PVD, CVA, HTN, chol.   Her blood pressure has been controlled at home, today their BP is BP: 140/90 She does workout, walks daily for 30 mins. She denies chest pain, shortness of breath, dizziness.  She has history of CVA due to aneurysm, PVD and MR, follows with Dr. Sondra Come, had repeat MRA and had aneurysm, will go for consultation August 29th. She is on 325 ASA.  She works at [employer], going to work 2 more years and retire. 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  She is not on cholesterol medication and denies myalgias. Her cholesterol is at goal. The cholesterol last visit was:   Lab Results  Component Value Date   CHOL 188 10/26/2015   HDL 135 10/26/2015   LDLCALC 42 10/26/2015   TRIG 53 10/26/2015   CHOLHDL 1.4 10/26/2015   She has been working on diet and exercise for prediabetes, and denies paresthesia of the feet, polydipsia, polyuria and visual disturbances. Last A1C in the office was:  Lab Results  Component Value Date   HGBA1C 5.3 10/26/2015   She has OSA and is on CPAP, she is on protonix for GERD.  Patient is NOT on allopurinol for gout, she is on colchicine and does not report a recent flare.  Lab Results  Component Value Date   LABURIC 4.2 10/23/2014   BMI is Body mass index is 29.93 kg/m., she is working on diet and exercise. Wt Readings from Last 3 Encounters:  09/06/16 214 lb 9.6 oz (97.3 kg)  09/04/16 225 lb (102.1 kg)  04/12/16 217 lb 3.2 oz (98.5 kg)   Patient is on Vitamin D supplement.   Lab Results  Component Value Date   VD25OH 35 10/26/2015      Current Medications:  Current Outpatient Prescriptions on File Prior to Visit  Medication Sig Dispense Refill  . amLODipine-valsartan (EXFORGE) 10-320 MG tablet TAKE ONE TABLET BY MOUTH ONCE DAILY 90 tablet 2  . aspirin EC 81 MG tablet Take 1 tablet (81 mg total) by mouth daily.    . colchicine 0.6 MG tablet Take 1 tablet (0.6 mg total) by mouth  daily. 30 tablet 2  . furosemide (LASIX) 40 MG tablet Take 1 tablet (40 mg total) by mouth daily. 30 tablet 11  . Multiple Vitamins-Minerals (MULTIVITAMIN WITH MINERALS) tablet Take 1 tablet by mouth daily.    . Omega-3 Fatty Acids (FISH OIL) 1200 MG CAPS Take 2,400 mg by mouth daily.     . pantoprazole (PROTONIX) 40 MG tablet Take 1 tablet (40 mg total) by mouth daily.    Marland Kitchen PREMARIN 0.9 MG tablet TAKE 1 TABLET BY MOUTH ONCE DAILY (NEED  OFFICE  VISIT  BEFORE  FURTHER  REFILLS) 30 tablet 0   No current  facility-administered medications on file prior to visit.     Medical History:  Past Medical History:  Diagnosis Date  . Arthritis   . Chronic diastolic heart failure ([place])   . GERD (gastroesophageal reflux disease)   . Heart murmur   . Hyperlipidemia   . Hypertension may, 1973  . Mitral regurgitation    a. Echo 01/2012 mild LVH, EF 55-65%, Gr 2 DD, mod MR, mild LAE, PASP 36;  b. Echo (02/2013):  EF 55-60%, Gr 1 DD, mild to mod MR, mild LAE (no sig change since prior echo)  . OSA on CPAP    7 yrs  . Peripheral vascular disease ([place])    cerebral aneursym  . Stroke ([place]) 07   no weakness  . Subarachnoid hemorrhage due to ruptured aneurysm ([place])    2003 - s/p coiling  . Unspecified vitamin D deficiency    Allergies Allergies  Allergen Reactions  . Aspirin Nausea And Vomiting  . Ace Inhibitors Nausea And Vomiting   Surgical History: reviewed and unchanged Family History: reviewed and unchanged Social History: reviewed and unchanged  Review of Systems: Review of Systems  Constitutional: Negative.   HENT: Negative.   Respiratory: Negative for cough, hemoptysis, sputum production, shortness of breath and wheezing.   Cardiovascular: Negative.  Negative for chest pain, palpitations and leg swelling (resolved).  Gastrointestinal: Negative for abdominal pain, blood in stool, constipation, diarrhea, heartburn, melena, nausea and vomiting.  Genitourinary: Negative.   Musculoskeletal: Negative for back pain, falls, joint pain, myalgias and neck pain.  Skin: Negative.   Neurological: Negative.  Negative for dizziness and headaches.  Psychiatric/Behavioral: Negative.  Negative for depression, hallucinations, memory loss, substance abuse and suicidal ideas. The patient is not nervous/anxious and does not have insomnia.     Physical Exam: Estimated body mass index is 29.93 kg/m as calculated from the following:   Height as of this encounter: 5\' 11"  (1.803 m).   Weight as of this  encounter: 214 lb 9.6 oz (97.3 kg). BP 140/90   Pulse 67   Temp (!) 97.5 F (36.4 C)   Resp 14   Ht 5\' 11"  (1.803 m)   Wt 214 lb 9.6 oz (97.3 kg)   SpO2 97%   BMI 29.93 kg/m  General Appearance: Well nourished, in no apparent distress.  Eyes: PERRLA, EOMs, conjunctiva no swelling or erythema, normal fundi and vessels.  Sinuses: No Frontal/maxillary tenderness  ENT/Mouth: Ext aud canals clear, normal light reflex with TMs without erythema, bulging. Good dentition. No erythema, swelling, or exudate on post pharynx. Tonsils not swollen or erythematous. Hearing normal.  Neck: Supple, thyroid slightly enlarged. No bruits  Respiratory: Respiratory effort normal, BS equal bilaterally without rales, rhonchi, wheezing or stridor.  Cardio: RRR with holosystolic murmur RSB, without rubs or gallops. Brisk peripheral pulses with 2+ edema.  Chest: symmetric, with normal excursions and percussion.  Abdomen: Soft, nontender, no guarding, rebound, hernias, masses, or organomegaly.  Lymphatics: Non tender without lymphadenopathy.  Musculoskeletal: Full ROM all peripheral extremities,5/5 strength, and normal gait.  Skin: Warm, dry without rashes, lesions, ecchymosis. Neuro: Cranial nerves intact, reflexes equal bilaterally. Normal muscle tone, no cerebellar symptoms. Sensation intact.  Psych: Awake and oriented X 3, normal affect, Insight and Judgment appropriate.    Vicie Mutters 11:15 AM [facility]

## 2016-09-06 ENCOUNTER — Ambulatory Visit (INDEPENDENT_AMBULATORY_CARE_PROVIDER_SITE_OTHER): Payer: BLUE CROSS/BLUE SHIELD | Admitting: Physician Assistant

## 2016-09-06 ENCOUNTER — Encounter: Payer: Self-pay | Admitting: Physician Assistant

## 2016-09-06 VITALS — BP 140/90 | HR 67 | Temp 97.5°F | Resp 14 | Ht 71.0 in | Wt 214.6 lb

## 2016-09-06 DIAGNOSIS — Z9989 Dependence on other enabling machines and devices: Secondary | ICD-10-CM

## 2016-09-06 DIAGNOSIS — E785 Hyperlipidemia, unspecified: Secondary | ICD-10-CM | POA: Diagnosis not present

## 2016-09-06 DIAGNOSIS — G4733 Obstructive sleep apnea (adult) (pediatric): Secondary | ICD-10-CM | POA: Diagnosis not present

## 2016-09-06 DIAGNOSIS — I5032 Chronic diastolic (congestive) heart failure: Secondary | ICD-10-CM | POA: Diagnosis not present

## 2016-09-06 DIAGNOSIS — I10 Essential (primary) hypertension: Secondary | ICD-10-CM | POA: Diagnosis not present

## 2016-09-06 DIAGNOSIS — Z79899 Other long term (current) drug therapy: Secondary | ICD-10-CM | POA: Diagnosis not present

## 2016-09-06 DIAGNOSIS — I340 Nonrheumatic mitral (valve) insufficiency: Secondary | ICD-10-CM | POA: Diagnosis not present

## 2016-09-07 LAB — BASIC METABOLIC PANEL WITH GFR
BUN: 14 mg/dL (ref 7–25)
CHLORIDE: 101 mmol/L (ref 98–110)
CO2: 28 mmol/L (ref 20–32)
Calcium: 9.2 mg/dL (ref 8.6–10.4)
Creat: 0.98 mg/dL (ref 0.50–0.99)
GFR, EST AFRICAN AMERICAN: 73 mL/min/{1.73_m2} (ref >=60)
GFR, EST NON AFRICAN AMERICAN: 63 mL/min/{1.73_m2} (ref >=60)
Glucose, Bld: 82 mg/dL (ref 65–99)
POTASSIUM: 4.2 mmol/L (ref 3.5–5.3)
SODIUM: 137 mmol/L (ref 135–146)

## 2016-09-07 LAB — CBC WITH DIFFERENTIAL/PLATELET
BASOS PCT: 0.7 %
Basophils Absolute: 43 cells/uL (ref 0–200)
Eosinophils Absolute: 305 cells/uL (ref 15–500)
Eosinophils Relative: 5 %
HEMATOCRIT: 31 % — AB (ref 35.0–45.0)
HEMOGLOBIN: 10.4 g/dL — AB (ref 11.7–15.5)
LYMPHS ABS: 2611 {cells}/uL (ref 850–3900)
MCH: 27.8 pg (ref 27.0–33.0)
MCHC: 33.5 g/dL (ref 32.0–36.0)
MCV: 82.9 fL (ref 80.0–100.0)
MPV: 9.7 fL (ref 7.5–12.5)
Monocytes Relative: 6 %
NEUTROS ABS: 2776 {cells}/uL (ref 1500–7800)
Neutrophils Relative %: 45.5 %
Platelets: 277 10*3/uL (ref 140–400)
RBC: 3.74 10*6/uL — AB (ref 3.80–5.10)
RDW: 14.9 % (ref 11.0–15.0)
Total Lymphocyte: 42.8 %
WBC: 6.1 10*3/uL (ref 3.8–10.8)
WBCMIX: 366 {cells}/uL (ref 200–950)

## 2016-09-07 LAB — VITAMIN B12: Vitamin B-12: 928 pg/mL (ref 200–1100)

## 2016-09-07 LAB — HEPATIC FUNCTION PANEL
AG Ratio: 1.2 (calc) (ref 1.0–2.5)
ALBUMIN MSPROF: 4.1 g/dL (ref 3.6–5.1)
ALT: 5 U/L — ABNORMAL LOW (ref 6–29)
AST: 11 U/L (ref 10–35)
Alkaline phosphatase (APISO): 50 U/L (ref 33–130)
BILIRUBIN DIRECT: 0.1 mg/dL (ref 0.0–0.2)
BILIRUBIN TOTAL: 0.3 mg/dL (ref 0.2–1.2)
GLOBULIN: 3.3 g/dL (ref 1.9–3.7)
Indirect Bilirubin: 0.2 mg/dL (calc) (ref 0.2–1.2)
Total Protein: 7.4 g/dL (ref 6.1–8.1)

## 2016-09-07 LAB — LIPID PANEL
Cholesterol: 195 mg/dL (ref <200)
HDL: 137 mg/dL (ref >50)
LDL CHOLESTEROL (CALC): 40 mg/dL
NON-HDL CHOLESTEROL (CALC): 58 mg/dL (ref <130)
TRIGLYCERIDES: 101 mg/dL (ref <150)
Total CHOL/HDL Ratio: 1.4 (calc) (ref <5.0)

## 2016-09-07 LAB — IRON,TIBC AND FERRITIN PANEL
%SAT: 13 % (calc) (ref 11–50)
Ferritin: 72 ng/mL (ref 20–288)
Iron: 47 ug/dL (ref 45–160)
TIBC: 354 mcg/dL (calc) (ref 250–450)

## 2016-09-07 LAB — TSH: TSH: 2.45 mIU/L (ref 0.40–4.50)

## 2016-09-07 LAB — TEST AUTHORIZATION

## 2016-09-07 LAB — MAGNESIUM: MAGNESIUM: 1.4 mg/dL — AB (ref 1.5–2.5)

## 2016-09-08 ENCOUNTER — Other Ambulatory Visit: Payer: Self-pay | Admitting: Internal Medicine

## 2016-09-08 NOTE — Progress Notes (Signed)
LVM for pt to return office call for LAB results.

## 2016-09-12 NOTE — Progress Notes (Signed)
Pt aware of lab results & voiced understanding of those results.

## 2016-09-12 NOTE — Progress Notes (Signed)
LVM for pt to return office call for LAB results.

## 2016-09-20 ENCOUNTER — Ambulatory Visit (HOSPITAL_COMMUNITY): Admission: RE | Admit: 2016-09-20 | Payer: BLUE CROSS/BLUE SHIELD | Source: Ambulatory Visit

## 2016-09-29 ENCOUNTER — Ambulatory Visit: Payer: BLUE CROSS/BLUE SHIELD | Admitting: Physician Assistant

## 2016-10-11 ENCOUNTER — Ambulatory Visit (HOSPITAL_COMMUNITY)
Admission: RE | Admit: 2016-10-11 | Discharge: 2016-10-11 | Disposition: A | Discharge status: Home / Self Care | Payer: BLUE CROSS/BLUE SHIELD | Source: Ambulatory Visit | Attending: Interventional Radiology | Admitting: Interventional Radiology

## 2016-10-11 DIAGNOSIS — Z712 Person consulting for explanation of examination or test findings: Secondary | ICD-10-CM | POA: Diagnosis not present

## 2016-10-11 DIAGNOSIS — I729 Aneurysm of unspecified site: Secondary | ICD-10-CM

## 2016-10-11 DIAGNOSIS — I671 Cerebral aneurysm, nonruptured: Secondary | ICD-10-CM | POA: Diagnosis not present

## 2016-10-11 DIAGNOSIS — R51 Headache: Secondary | ICD-10-CM | POA: Diagnosis not present

## 2016-10-11 HISTORY — PX: IR RADIOLOGIST EVAL & MGMT: IMG5224

## 2016-10-12 ENCOUNTER — Encounter (HOSPITAL_COMMUNITY): Payer: Self-pay | Admitting: Interventional Radiology

## 2016-10-12 ENCOUNTER — Other Ambulatory Visit (HOSPITAL_COMMUNITY): Payer: Self-pay | Admitting: Interventional Radiology

## 2016-10-12 DIAGNOSIS — I671 Cerebral aneurysm, nonruptured: Secondary | ICD-10-CM

## 2016-10-16 ENCOUNTER — Telehealth: Payer: Self-pay | Admitting: Cardiovascular Disease

## 2016-10-16 DIAGNOSIS — I10 Essential (primary) hypertension: Secondary | ICD-10-CM

## 2016-10-16 DIAGNOSIS — G4733 Obstructive sleep apnea (adult) (pediatric): Secondary | ICD-10-CM | POA: Diagnosis not present

## 2016-10-16 MED ORDER — OLMESARTAN MEDOXOMIL 40 MG PO TABS: 40.0000 mg | ORAL_TABLET | Freq: Every day | ORAL | 3 refills | Status: DC

## 2016-10-16 MED ORDER — AMLODIPINE BESYLATE 10 MG PO TABS: 10.0000 mg | ORAL_TABLET | Freq: Every day | ORAL | 3 refills | Status: DC

## 2016-10-16 NOTE — Telephone Encounter (Signed)
DC the Exforge Start amlodipine 10 mg a day  Olmasartan 40 mg a day  Check bmp in 3 weeks

## 2016-10-16 NOTE — Telephone Encounter (Signed)
New message     Pt c/o medication issue:  1. Name of Medication:  amLODipine-valsartan (EXFORGE) 10-320 MG tablet  2. How are you currently taking this medication (dosage and times per day)? TAKE ONE TABLET BY MOUTH ONCE   3. Are you having a reaction (difficulty breathing--STAT)? no  4. What is your medication issue?[facility] told her there is a recall on it and she needs a different medication     [facility] [address]   30 day supply

## 2016-10-16 NOTE — Telephone Encounter (Signed)
Spoke with patient about medication changes. I scheduled her for lab appointment on 10/16 and advised her to keep her f/u appointment with Richardson Dopp, PA on 10/1. I advised her to call sooner with questions or concerns. She verbalized understanding and agreement and thanked me for the call.

## 2016-10-18 ENCOUNTER — Other Ambulatory Visit: Payer: Self-pay | Admitting: Radiology

## 2016-10-20 ENCOUNTER — Encounter (HOSPITAL_COMMUNITY): Payer: Self-pay

## 2016-10-20 ENCOUNTER — Encounter (HOSPITAL_COMMUNITY)
Admission: RE | Admit: 2016-10-20 | Discharge: 2016-10-20 | Disposition: A | Discharge status: Home / Self Care | Payer: BLUE CROSS/BLUE SHIELD | Source: Ambulatory Visit | Attending: Interventional Radiology | Admitting: Interventional Radiology

## 2016-10-20 ENCOUNTER — Telehealth: Payer: Self-pay | Admitting: Radiology

## 2016-10-20 DIAGNOSIS — I671 Cerebral aneurysm, nonruptured: Secondary | ICD-10-CM | POA: Insufficient documentation

## 2016-10-20 HISTORY — DX: Anemia, unspecified: D64.9

## 2016-10-20 LAB — CBC WITH DIFFERENTIAL/PLATELET
BASOS ABS: 0 10*3/uL (ref 0.0–0.1)
BASOS PCT: 0 %
EOS ABS: 0.2 10*3/uL (ref 0.0–0.7)
EOS PCT: 3 %
HCT: 32.4 % — ABNORMAL LOW (ref 36.0–46.0)
Hemoglobin: 10.2 g/dL — ABNORMAL LOW (ref 12.0–15.0)
Lymphocytes Relative: 45 %
Lymphs Abs: 3.5 10*3/uL (ref 0.7–4.0)
MCH: 26.6 pg (ref 26.0–34.0)
MCHC: 31.5 g/dL (ref 30.0–36.0)
MCV: 84.4 fL (ref 78.0–100.0)
Monocytes Absolute: 0.4 10*3/uL (ref 0.1–1.0)
Monocytes Relative: 5 %
NEUTROS PCT: 47 %
Neutro Abs: 3.7 10*3/uL (ref 1.7–7.7)
PLATELETS: 270 10*3/uL (ref 150–400)
RBC: 3.84 MIL/uL — AB (ref 3.87–5.11)
RDW: 14.6 % (ref 11.5–15.5)
WBC: 7.9 10*3/uL (ref 4.0–10.5)

## 2016-10-20 LAB — APTT: APTT: 31 s (ref 24–36)

## 2016-10-20 LAB — PROTIME-INR
INR: 0.93
PROTHROMBIN TIME: 12.3 s (ref 11.4–15.2)

## 2016-10-20 NOTE — Progress Notes (Signed)
Has appointment with Dr. Cathie Olden on Monday 10-23-2016  @1515    Pam Nonie Hoyer called pt. States that she does not have a RX for Plavix.  Pam will check into it and let the patient know.

## 2016-10-20 NOTE — Pre-Procedure Instructions (Addendum)
Yolanda Espinoza  10/20/2016      [place], [place] - [address] Yolanda Espinoza [place] [address] Phone: [phone] Fax: [phone]  [place] [address] Phone: [phone] Fax: [phone]    Your procedure is scheduled on 10-25-2016 Wednesday  Report to [facility] Admitting at 7:00 A.M.  Call this number if you have problems the morning of surgery:  [phone]   Remember:  Do not eat food or drink liquids after midnight.    Take these medicines the morning of surgery with A SIP OF WATER Aspirin,Plavix,Premarin          Do not wear jewelry, make-up or nail polish.  Do not wear lotions, powders, or perfumes, or deoderant.  Do not shave 48 hours prior to surgery.  Men may shave face and neck.   Do not bring valuables to the hospital.  [facility] is not responsible for any belongings or valuables.  Contacts, dentures or bridgework may not be worn into surgery.  Leave your suitcase in the car.  After surgery it may be brought to your room.  For patients admitted to the hospital, discharge time will be determined by your treatment team.  Patients discharged the day of surgery will not be allowed to drive home.    Special Instructions: [facility] - Preparing for Surgery  Before surgery, you can play an important role.  Because skin is not sterile, your skin needs to be as free of germs as possible.  You can reduce the number of germs on you skin by washing with CHG (chlorahexidine gluconate) soap before surgery.  CHG is an antiseptic cleaner which kills germs and bonds with the skin to continue killing germs even after washing.  Please DO NOT use if you have an allergy to CHG or antibacterial soaps.  If your skin becomes reddened/irritated stop using the CHG and inform your nurse when you arrive at Short Stay.  Do not shave (including legs  and underarms) for at least 48 hours prior to the first CHG shower.  You may shave your face.  Please follow these instructions carefully:   1.  Shower with CHG Soap the night before surgery and the   morning of Surgery.  2.  If you choose to wash your hair, wash your hair first as usual with your normal shampoo.  3.  After you shampoo, rinse your hair and body thoroughly to remove the  Shampoo.  4.  Use CHG as you would any other liquid soap.  You can apply chg directly  to the skin and wash gently with scrungie or a clean washcloth.  5.  Apply the CHG Soap to your body ONLY FROM THE NECK DOWN.   Do not use on open wounds or open sores.  Avoid contact with your eyes,  ears, mouth and genitals (private parts).  Wash genitals (private parts) with your normal soap.  6.  Wash thoroughly, paying special attention to the area where your surgery will be performed.  7.  Thoroughly rinse your body with warm water from the neck down.  8.  DO NOT shower/wash with your normal soap after using and rinsing o  the CHG Soap.  9.  Pat yourself dry with a clean towel.            10.  Wear clean pajamas.  11.  Place clean sheets on your bed the night of your first shower and do not sleep with pets.  Day of Surgery  Do not apply any lotions/deodorants the morning of surgery.  Please wear clean clothes to the hospital/surgery center.

## 2016-10-20 NOTE — Progress Notes (Signed)
  Called Rx Plavix 75 mg #30 Daily po  To [place] [phone]  Pt to start asap per Dr Estanislado Pandy

## 2016-10-23 ENCOUNTER — Encounter: Payer: Self-pay | Admitting: Physician Assistant

## 2016-10-23 ENCOUNTER — Ambulatory Visit (INDEPENDENT_AMBULATORY_CARE_PROVIDER_SITE_OTHER): Payer: BLUE CROSS/BLUE SHIELD | Admitting: Physician Assistant

## 2016-10-23 ENCOUNTER — Encounter (INDEPENDENT_AMBULATORY_CARE_PROVIDER_SITE_OTHER): Payer: Self-pay

## 2016-10-23 VITALS — BP 130/62 | HR 64 | Ht 71.0 in | Wt 215.0 lb

## 2016-10-23 DIAGNOSIS — I340 Nonrheumatic mitral (valve) insufficiency: Secondary | ICD-10-CM | POA: Diagnosis not present

## 2016-10-23 DIAGNOSIS — I10 Essential (primary) hypertension: Secondary | ICD-10-CM

## 2016-10-23 DIAGNOSIS — I5032 Chronic diastolic (congestive) heart failure: Secondary | ICD-10-CM

## 2016-10-23 DIAGNOSIS — I671 Cerebral aneurysm, nonruptured: Secondary | ICD-10-CM

## 2016-10-23 MED ORDER — FUROSEMIDE 40 MG PO TABS: 40.0000 mg | ORAL_TABLET | Freq: Every day | ORAL | 11 refills | Status: DC | PRN

## 2016-10-23 NOTE — Patient Instructions (Signed)
Medication Instructions:  1. A REFILL WAS SENT IN FOR LASIX; CHANGE LASIX TO TAKE DAILY ONLY AS NEEDED FOR EXCESS SWELLING  Labwork: NONE ORDERED   Testing/Procedures: Your physician has requested that you have an echocardiogram. Echocardiography is a painless test that uses sound waves to create images of your heart. It provides your doctor with information about the size and shape of your heart and how well your heart's chambers and valves are working. This procedure takes approximately one hour. There are no restrictions for this procedure.    Follow-Up: Your physician wants you to follow-up in: 6 MONTHS WITH DR. Acie Fredrickson You will receive a reminder letter in the mail two months in advance. If you don't receive a letter, please call our office to schedule the follow-up appointment.   Any Other Special Instructions Will Be Listed Below (If Applicable).     If you need a refill on your cardiac medications before your next appointment, please call your pharmacy.

## 2016-10-23 NOTE — Progress Notes (Addendum)
Anesthesia Chart Review:  Pt is a 61 year old female scheduled for cerebral arteriogram with possible embolization of left internal carotid artery aneurysms on 10/25/2016 with Luanne Bras, MD  - PCP is Unk Pinto, MD - Cardiologist is Mertie Moores, MD - Nephrologist is Donato Heinz, MD  PMH includes:   Chronic diastolic HF, mitral regurgitation, HTN, subarachnoid hemorrhage due to ruptured aneurysm (s/p coiling 2003), hyperlipidemia, anemia, stroke (2007), OSA, CKD (stage II).  Former smoker. BMI 30  Medications include: amlodipine, ASA 81mg , lasix, olmesartan  BP (!) 154/84   Pulse 68   Temp 36.6 C   Resp 20   Ht 5\' 11"  (1.803 m)   Wt 215 lb 8 oz (97.8 kg)   SpO2 100%   BMI 30.06 kg/m   Preoperative labs reviewed.  P2y12 and BMET will be obtained DOS.   EKG 04/07/16: NSR. Poor R wave progression.   Echo 02/26/13:  - Left ventricle: The cavity size was mildly dilated. Systolic function was normal. The estimated ejectionfraction was in the range of 55% to 60%. Wall motion wasnormal; there were no regional wall motion abnormalities.Doppler parameters are consistent with abnormal leftventricular relaxation (grade 1 diastolic dysfunction). - Mitral valve: Mild to moderate regurgitation directedposteriorly. - Left atrium: The atrium was mildly dilated. - Impressions: Compared to the prior study, there has been no significantinterval change.   Prior to cerebral angiogram 09/05/16, PA assessing pt heard loud heart murmur.  Pt was asymptomatic so procedure took place as as scheduled and pt was asked to follow up with her cardiologist.  She saw Richardson Dopp, [place] with cardiology 10/23/16.  No murmur was heard during that exam and pt is not symptomatic, but given pt's history of mild MR, a repeat echo was ordered for 11/01/16; routine f/u in 6 months recommended.  Mr. Kathleen Argue note documents an awareness of upcoming procedure.   If no changes, I anticipate pt can proceed  with surgery as scheduled.   Willeen Cass, FNP-BC [facility] Short Stay Surgical Center/Anesthesiology Phone: [phone] 10/24/2016 11:31 AM

## 2016-10-23 NOTE — Progress Notes (Signed)
Cardiology Office Note:    Date:  10/23/2016   ID:  Yolanda Espinoza, DOB 07-12-1955, MRN [id number]  PCP:  Unk Pinto, MD  Cardiologist:  Dr. Liam Rogers    Referring MD: Unk Pinto, MD   Chief Complaint  Patient presents with  . Heart Murmur    History of Present Illness:    Yolanda Espinoza is a 61 y.o. female with a hx of HTN, subarachnoid hemorrhage due to cerebral aneurysm status post coiling in 2003, mitral regurgitation, diastolic heart failure. Echo in 2/15 demonstrated normal LV function, mild diastolic dysfunction, mild to moderate mitral regurgitation. Last seen by Dr. Acie Fredrickson 3/18.  Yolanda Espinoza returns for evaluation of a cardiac murmur.  Yolanda Espinoza is here alone.  Yolanda Espinoza had a recent cerebral angiogram that demonstrated an aneurysm that needs coiling.  Yolanda Espinoza was noted to have a murmur on exam and was asked to follow up here.  Yolanda Espinoza denies chest pain, significant shortness of breath.  Yolanda Espinoza denies syncope, or paroxysmal nocturnal dyspnea.   Yolanda Espinoza does note mild lower extremity edema.  Prior CV studies:   The following studies were reviewed today:  Echocardiogram 02/26/13 EF 55-60, normal wall motion, grade 1 diastolic dysfunction, mild to moderate MR, mild LAE  Echocardiogram 01/2012  mild LVH, EF 48-54%, grade 2 diastolic dysfunction, moderate MR, mild LAE, PASP 36.  Past Medical History:  Diagnosis Date  . Anemia   . Arthritis   . Chronic diastolic heart failure ([place])   . GERD (gastroesophageal reflux disease)   . Heart murmur   . Hyperlipidemia   . Hypertension may, 1973  . Mitral regurgitation    a. Echo 01/2012 mild LVH, EF 55-65%, Gr 2 DD, mod MR, mild LAE, PASP 36;  b. Echo (02/2013):  EF 55-60%, Gr 1 DD, mild to mod MR, mild LAE (no sig change since prior echo)  . OSA on CPAP    7 yrs  . Peripheral vascular disease ([place])    cerebral aneursym  . Stroke ([place]) 07   no weakness  . Subarachnoid hemorrhage due to ruptured aneurysm ([place])    2003 - s/p coiling  . Unspecified  vitamin D deficiency     Past Surgical History:  Procedure Laterality Date  . ABDOMINAL HYSTERECTOMY    . ANEURYSM COILING    . head surgery     aneurysm ruptured- stroke  . IR 3D INDEPENDENT WKST  09/04/2016  . IR ANGIO INTRA EXTRACRAN SEL COM CAROTID INNOMINATE BILAT MOD SED  09/04/2016  . IR ANGIO VERTEBRAL SEL VERTEBRAL BILAT MOD SED  09/04/2016  . IR ANGIOGRAM EXTREMITY LEFT  09/04/2016  . IR RADIOLOGIST EVAL & MGMT  10/11/2016  . RADIOLOGY WITH ANESTHESIA N/A 04/23/2013   Procedure: RADIOLOGY WITH ANESTHESIA;  Surgeon: Rob Hickman, MD;  Location: [facility];  Service: Radiology;  Laterality: N/A;  . TONSILLECTOMY AND ADENOIDECTOMY      Current Medications: Current Meds  Medication Sig  . amLODipine (NORVASC) 10 MG tablet Take 10 mg by mouth daily.  Marland Kitchen aspirin EC 81 MG tablet Take 1 tablet (81 mg total) by mouth daily.  . Multiple Vitamins-Minerals (MULTIVITAMIN WITH MINERALS) tablet Take 1 tablet by mouth daily.  Marland Kitchen olmesartan (BENICAR) 40 MG tablet Take 1 tablet (40 mg total) by mouth daily.  . Omega-3 Fatty Acids (FISH OIL) 1200 MG CAPS Take 2,400 mg by mouth daily.   Marland Kitchen PREMARIN 0.9 MG tablet TAKE 1 TABLET BY MOUTH ONCE DAILY ( NEED OFFICE VISIT BEFORE  FURTHER REFILLS )  . [DISCONTINUED] furosemide (LASIX) 40 MG tablet Take 1 tablet (40 mg total) by mouth daily.     Allergies:   Aspirin and Ace inhibitors   Social History   Social History  . Marital status: Married    Spouse name: N/A  . Number of children: N/A  . Years of education: N/A   Social History Main Topics  . Smoking status: Former Smoker    Packs/day: 0.25    Years: 3.00    Types: Cigarettes    Quit date: 01/24/1999  . Smokeless tobacco: Never Used  . Alcohol use No  . Drug use: No  . Sexual activity: Not Asked   Other Topics Concern  . None   Social History Narrative  . None     Family Hx: The patient's family history includes Hypertension in her father, sister, sister, and sister; Kidney  disease in her father.  ROS:   Please see the history of present illness.    ROS All other systems reviewed and are negative.   EKGs/Labs/Other Test Reviewed:    EKG:  EKG is  ordered today.  The ekg ordered today demonstrates NSR, HR 63, normal axis, QTC 429 ms  Recent Labs: 03/14/2016: Brain Natriuretic Peptide 168.7 09/06/2016: ALT 5; BUN 14; Creat 0.98; Magnesium 1.4; Potassium 4.2; Sodium 137; TSH 2.45 10/20/2016: Hemoglobin 10.2; Platelets 270   Recent Lipid Panel Lab Results  Component Value Date/Time   CHOL 195 09/06/2016 11:30 AM   TRIG 101 09/06/2016 11:30 AM   HDL 137 09/06/2016 11:30 AM   CHOLHDL 1.4 09/06/2016 11:30 AM   LDLCALC 42 10/26/2015 09:25 AM    Physical Exam:    VS:  BP 130/62   Pulse 64   Ht 5\' 11"  (1.803 m)   Wt 215 lb (97.5 kg)   SpO2 96%   BMI 29.99 kg/m     Wt Readings from Last 3 Encounters:  10/23/16 215 lb (97.5 kg)  10/20/16 215 lb 8 oz (97.8 kg)  09/06/16 214 lb 9.6 oz (97.3 kg)     Physical Exam  Constitutional: Yolanda Espinoza is oriented to person, place, and time. Yolanda Espinoza appears well-developed and well-nourished. No distress.  HENT:  Head: Normocephalic and atraumatic.  Eyes: No scleral icterus.  Neck: No JVD present.  Cardiovascular: Normal rate and regular rhythm.   No murmur heard. Pulmonary/Chest: Effort normal. Yolanda Espinoza has no rales.  Abdominal: Soft.  Musculoskeletal: Yolanda Espinoza exhibits edema (trace bilateral LE edema).  Neurological: Yolanda Espinoza is alert and oriented to person, place, and time.  Skin: Skin is warm and dry.  Psychiatric: Yolanda Espinoza has a normal mood and affect.    ASSESSMENT:    1. Mitral valve insufficiency, unspecified etiology   2. Chronic diastolic CHF (congestive heart failure) ([place])   3. Essential hypertension   4. Brain aneurysm    PLAN:    In order of problems listed above:  1. Mitral valve insufficiency, unspecified etiology  Mild to moderate mitral regurgitation by echocardiogram in 2015. Yolanda Espinoza was noted to have a  significant murmur on exam prior to her upcoming intervention for cerebral aneurysm. On exam today, I cannot appreciate a heart murmur. However, it is not unreasonable to proceed with repeat echocardiogram given her prior history of mitral regurgitation.  -  Arrange 2-D echocardiogram  2. Chronic diastolic CHF (congestive heart failure) ([facility]) Volume is normal. Yolanda Espinoza has not taken Lasix on a regular basis in quite some time. Yolanda Espinoza can change Lasix to once daily as  needed for lower extremity edema.  3. Essential hypertension  The patient's blood pressure is controlled on her current regimen.  Continue current therapy.    4. Brain aneurysm Intervention pending with interventional radiology.   Dispo:  Return in about 6 months (around 04/23/2017) for Routine Follow Up, w/ Dr. Acie Fredrickson.   Medication Adjustments/Labs and Tests Ordered: Current medicines are reviewed at length with the patient today.  Concerns regarding medicines are outlined above.  Tests Ordered: Orders Placed This Encounter  Procedures  . EKG 12-Lead  . ECHOCARDIOGRAM COMPLETE   Medication Changes: Meds ordered this encounter  Medications  . furosemide (LASIX) 40 MG tablet    Sig: Take 1 tablet (40 mg total) by mouth daily as needed for edema.    Dispense:  30 tablet    Refill:  11    Order Specific Question:   Supervising Provider    Answer:   Thayer Headings [[id number]]    Signed, Richardson Dopp, PA-C  10/23/2016 4:51 PM    [facility] [place], [place], [place]  [address] Phone: [phone]; Fax: [phone]

## 2016-10-25 ENCOUNTER — Ambulatory Visit (HOSPITAL_COMMUNITY): Admission: RE | Admit: 2016-10-25 | Payer: BLUE CROSS/BLUE SHIELD | Source: Ambulatory Visit

## 2016-10-26 ENCOUNTER — Other Ambulatory Visit: Payer: Self-pay | Admitting: Radiology

## 2016-10-27 ENCOUNTER — Other Ambulatory Visit: Payer: Self-pay | Admitting: Radiology

## 2016-10-27 ENCOUNTER — Other Ambulatory Visit (HOSPITAL_COMMUNITY): Payer: Self-pay

## 2016-10-27 LAB — PLATELET INHIBITION P2Y12: PLATELET FUNCTION P2Y12: 246 [PRU] (ref 194–418)

## 2016-10-27 NOTE — Progress Notes (Signed)
Pt made aware to arrive at 6:30AM on Monday, October 30, 2016. Pt had a PAT appointment on 10/20/16. Pt verbalized understanding of all pre-op instructions that were previously provided at PAT.

## 2016-10-30 ENCOUNTER — Ambulatory Visit (HOSPITAL_COMMUNITY)
Admission: RE | Admit: 2016-10-30 | Discharge: 2016-10-30 | Disposition: A | Discharge status: Home / Self Care | Payer: BLUE CROSS/BLUE SHIELD | Source: Ambulatory Visit | Attending: Interventional Radiology | Admitting: Interventional Radiology

## 2016-10-30 ENCOUNTER — Ambulatory Visit (HOSPITAL_COMMUNITY): Payer: BLUE CROSS/BLUE SHIELD | Admitting: Emergency Medicine

## 2016-10-30 ENCOUNTER — Encounter (HOSPITAL_COMMUNITY)
Admission: RE | Disposition: A | Discharge status: Home / Self Care | Payer: Self-pay | Source: Ambulatory Visit | Attending: Interventional Radiology

## 2016-10-30 ENCOUNTER — Encounter (HOSPITAL_COMMUNITY): Payer: Self-pay

## 2016-10-30 ENCOUNTER — Encounter: Payer: Self-pay | Admitting: Physician Assistant

## 2016-10-30 DIAGNOSIS — G4730 Sleep apnea, unspecified: Secondary | ICD-10-CM | POA: Diagnosis not present

## 2016-10-30 DIAGNOSIS — Z87891 Personal history of nicotine dependence: Secondary | ICD-10-CM | POA: Insufficient documentation

## 2016-10-30 DIAGNOSIS — I671 Cerebral aneurysm, nonruptured: Secondary | ICD-10-CM | POA: Insufficient documentation

## 2016-10-30 DIAGNOSIS — Z5309 Procedure and treatment not carried out because of other contraindication: Secondary | ICD-10-CM | POA: Insufficient documentation

## 2016-10-30 DIAGNOSIS — R51 Headache: Secondary | ICD-10-CM | POA: Diagnosis not present

## 2016-10-30 DIAGNOSIS — Z9989 Dependence on other enabling machines and devices: Secondary | ICD-10-CM | POA: Insufficient documentation

## 2016-10-30 DIAGNOSIS — Z955 Presence of coronary angioplasty implant and graft: Secondary | ICD-10-CM | POA: Insufficient documentation

## 2016-10-30 DIAGNOSIS — Z8673 Personal history of transient ischemic attack (TIA), and cerebral infarction without residual deficits: Secondary | ICD-10-CM | POA: Diagnosis not present

## 2016-10-30 DIAGNOSIS — I340 Nonrheumatic mitral (valve) insufficiency: Secondary | ICD-10-CM | POA: Diagnosis not present

## 2016-10-30 DIAGNOSIS — I10 Essential (primary) hypertension: Secondary | ICD-10-CM | POA: Diagnosis not present

## 2016-10-30 HISTORY — PX: RADIOLOGY WITH ANESTHESIA: SHX6223

## 2016-10-30 LAB — BASIC METABOLIC PANEL
Anion gap: 8 (ref 5–15)
BUN: 9 mg/dL (ref 6–20)
CO2: 25 mmol/L (ref 22–32)
Calcium: 8.8 mg/dL — ABNORMAL LOW (ref 8.9–10.3)
Chloride: 105 mmol/L (ref 101–111)
Creatinine, Ser: 0.87 mg/dL (ref 0.44–1.00)
GFR calc Af Amer: >60 mL/min (ref >60)
GLUCOSE: 96 mg/dL (ref 65–99)
POTASSIUM: 3.7 mmol/L (ref 3.5–5.1)
Sodium: 138 mmol/L (ref 135–145)

## 2016-10-30 LAB — PLATELET INHIBITION P2Y12: PLATELET FUNCTION P2Y12: 230 [PRU] (ref 194–418)

## 2016-10-30 SURGERY — RADIOLOGY WITH ANESTHESIA
Anesthesia: General

## 2016-10-30 MED ORDER — NITROGLYCERIN 1 MG/10 ML FOR IR/CATH LAB
INTRA_ARTERIAL | Status: AC
  Filled 2016-10-30: qty 10

## 2016-10-30 MED ORDER — SODIUM CHLORIDE 0.9 % IV SOLN: INTRAVENOUS | Status: DC

## 2016-10-30 MED ORDER — EPTIFIBATIDE 20 MG/10ML IV SOLN
INTRAVENOUS | Status: AC
  Filled 2016-10-30: qty 10

## 2016-10-30 NOTE — H&P (Signed)
Chief Complaint: headaches, history of left cerebral aneurysm  Supervising Physician: Luanne Bras  Patient Status: [facility] - Out-pt  HPI: Yolanda Espinoza is a 61 y.o. female with a history of a right cerebral aneurysm that Dr. Estanislado Pandy treated in the early 2000s.  She has been followed and has been stable.  Over the last several weeks she has developed new headaches.  She denies any other symptoms such as vision changes, chest pain, SOB, fevers, chills, etc.  She underwent cerebral angiogram showing two left sided intra-cranial ICA aneurysms. She is now scheduled for intervention today. She feels well, no recent illness, fevers, chills, abd pain. Has been NPO this am except meds including Plavix 40m. Son at bedside with her.  Past Medical History:  Past Medical History:  Diagnosis Date  . Anemia   . Arthritis   . Chronic diastolic heart failure ([place])   . GERD (gastroesophageal reflux disease)   . Heart murmur   . Hyperlipidemia   . Hypertension may, 1973  . Mitral regurgitation    a. Echo 01/2012 mild LVH, EF 55-65%, Gr 2 DD, mod MR, mild LAE, PASP 36;  b. Echo (02/2013):  EF 55-60%, Gr 1 DD, mild to mod MR, mild LAE (no sig change since prior echo)  . OSA on CPAP    7 yrs  . Peripheral vascular disease ([place])    cerebral aneursym  . Stroke ([place]) 07   no weakness  . Subarachnoid hemorrhage due to ruptured aneurysm ([place])    2003 - s/p coiling  . Unspecified vitamin D deficiency     Past Surgical History:  Past Surgical History:  Procedure Laterality Date  . ABDOMINAL HYSTERECTOMY    . ANEURYSM COILING    . head surgery     aneurysm ruptured- stroke  . IR 3D INDEPENDENT WKST  09/04/2016  . IR ANGIO INTRA EXTRACRAN SEL COM CAROTID INNOMINATE BILAT MOD SED  09/04/2016  . IR ANGIO VERTEBRAL SEL VERTEBRAL BILAT MOD SED  09/04/2016  . IR ANGIOGRAM EXTREMITY LEFT  09/04/2016  . IR RADIOLOGIST EVAL & MGMT  10/11/2016  . RADIOLOGY WITH ANESTHESIA N/A 04/23/2013   Procedure:  RADIOLOGY WITH ANESTHESIA;  Surgeon: Rob Hickman, MD;  Location: [facility];  Service: Radiology;  Laterality: N/A;  . TONSILLECTOMY AND ADENOIDECTOMY      Family History:  Family History  Problem Relation Age of Onset  . Hypertension Father   . Kidney disease Father   . Hypertension Sister   . Hypertension Sister   . Hypertension Sister     Social History:  reports that she quit smoking about 17 years ago. Her smoking use included Cigarettes. She has a 0.75 pack-year smoking history. She has never used smokeless tobacco. She reports that she does not drink alcohol or use drugs.  Allergies:  Allergies  Allergen Reactions  . Aspirin Nausea And Vomiting    Can take coated Aspirin  . Ace Inhibitors Nausea And Vomiting    Medications: Medications reviewed in epic  Please HPI for pertinent positives, otherwise complete 10 system ROS negative.  Physical Exam: There were no vitals taken for this visit. There is no height or weight on file to calculate BMI. General: pleasant, WD, WN black female who is laying in bed in NAD HEENT: head is normocephalic, atraumatic.  Sclera are noninjected.  PERRL.  Ears and nose without any masses or lesions.  Mouth is pink and moist Heart: regular, rate, and rhythm.  Normal s1,s2. Very  loud systolic murmur and rub.  The rub does not dissipate with breath holding.  Palpable radial pulses bilaterally Lungs: CTAB, no wheezes, rhonchi, or rales noted.  Respiratory effort nonlabored Abd: soft, NT, obese, +BS, no masses, hernias, or organomegaly Psych: A&Ox3 with an appropriate affect.   Labs: CBC    Component Value Date/Time   WBC 7.9 10/20/2016 1620   RBC 3.84 (L) 10/20/2016 1620   HGB 10.2 (L) 10/20/2016 1620   HCT 32.4 (L) 10/20/2016 1620   PLT 270 10/20/2016 1620   MCV 84.4 10/20/2016 1620   MCH 26.6 10/20/2016 1620   MCHC 31.5 10/20/2016 1620   RDW 14.6 10/20/2016 1620   LYMPHSABS 3.5 10/20/2016 1620   MONOABS 0.4 10/20/2016 1620    EOSABS 0.2 10/20/2016 1620   BASOSABS 0.0 10/20/2016 1620   BMET    Component Value Date/Time   NA 137 09/06/2016 1130   K 4.2 09/06/2016 1130   CL 101 09/06/2016 1130   CO2 28 09/06/2016 1130   GLUCOSE 82 09/06/2016 1130   BUN 14 09/06/2016 1130   CREATININE 0.98 09/06/2016 1130   CALCIUM 9.2 09/06/2016 1130   GFRNONAA 63 09/06/2016 1130   GFRAA 73 09/06/2016 1130   PT/INR: 12.3/0.93   Imaging: No results found.  Assessment/Plan 1. Headaches, history of right cerebral aneurysm with treatment. (L)sided intracranial aneurysms Plan for angio with intent to treat left sided aneurysms with coil/stent embolization Labs pending Procedure reviewed with pt and son. Risks and benefits were discussed with the patient including, but not limited to bleeding, infection, vascular injury or contrast induced renal failure.  This interventional procedure involves the use of X-rays and because of the nature of the planned procedure, it is possible that we will have prolonged use of X-ray fluoroscopy.  Potential radiation risks to you include (but are not limited to) the following: - A slightly elevated risk for cancer  several years later in life. This risk is typically less than 0.5% percent. This risk is low in comparison to the normal incidence of human cancer, which is 33% for women and 50% for men according to the [place]. - Radiation induced injury can include skin redness, resembling a rash, tissue breakdown / ulcers and hair loss (which can be temporary or permanent).   The likelihood of either of these occurring depends on the difficulty of the procedure and whether you are sensitive to radiation due to previous procedures, disease, or genetic conditions.   IF your procedure requires a prolonged use of radiation, you will be notified and given written instructions for further action.  It is your responsibility to monitor the irradiated area for the 2 weeks following  the procedure and to notify your physician if you are concerned that you have suffered a radiation induced injury.    All of the patient's questions were answered, patient is agreeable to proceed.  Consent signed and in chart.   Thank you for this interesting consult.  I greatly enjoyed meeting Yolanda Espinoza and look forward to participating in their care.  A copy of this report was sent to the requesting provider on this date.  Electronically Signed: Ascencion Dike 10/30/2016, 7:23 AM   I spent a total of    40 Minutes in face to face in clinical consultation, greater than 50% of which was counseling/coordinating care for headaches, cardiac murmur/rub

## 2016-10-30 NOTE — Progress Notes (Signed)
Patient ID: Yolanda Espinoza, female   DOB: 10/31/1955, 61 y.o.   MRN: [id number] INR. Patient scheduled to have elective embolization of LT ICA superior hypophyseal aneurysm. However platelet inhibition suboptimal. Therefore procedure postponed. Will D/C plavix. Start on  brilinta 90 mg BID and 81 mg of aspirin.  Patient informed  regarding chest tightness,difficulty breathing palpitations and syncope. Should those occur to stop the  brilinta and seek med attention.. Patient expressed understanding of  the management plan. Will reschedule. S.Sheriece Jefcoat MD

## 2016-10-30 NOTE — Anesthesia Preprocedure Evaluation (Signed)
Anesthesia Evaluation  Patient identified by MRN, date of birth, ID band Patient awake    Reviewed: Allergy & Precautions, H&P , NPO status , Patient's Chart, lab work & pertinent test results  History of Anesthesia Complications Negative for: history of anesthetic complications  Airway Mallampati: II  TM Distance: >3 FB Neck ROM: Full    Dental  (+) Upper Dentures, Partial Lower   Pulmonary sleep apnea and Continuous Positive Airway Pressure Ventilation , former smoker,    Pulmonary exam normal breath sounds clear to auscultation       Cardiovascular hypertension, Pt. on medications + Peripheral Vascular Disease and +CHF  Normal cardiovascular exam Rhythm:Regular Rate:Normal  Diastolic HF, mitral regurgitation, heart murmur, hyperlipdemia Echo 01/2012 mild LVH, EF 55-65%, Gr 2 DD, mod MR, mild LAE, PASP 36;  b. Echo (02/2013):  EF 55-60%, Gr 1 DD, mild to mod MR, mild LAE (no sig change since prior echo)    Neuro/Psych S/p [facility] 2003 L PCOM aneurysm stent/coil 2003 L ACA aneurysm stent/coil 2004 CVA, No Residual Symptoms    GI/Hepatic Neg liver ROS,   Endo/Other  negative endocrine ROS  Renal/GU negative Renal ROS  negative genitourinary   Musculoskeletal negative musculoskeletal ROS (+)   Abdominal Normal abdominal exam  (+)   Peds  Hematology  (+) anemia ,   Anesthesia Other Findings   Reproductive/Obstetrics negative OB ROS                             Anesthesia Physical Anesthesia Plan  ASA: III  Anesthesia Plan: General   Post-op Pain Management:    Induction: Intravenous  PONV Risk Score and Plan: 3 and Ondansetron, Dexamethasone, Midazolam and Propofol infusion  Airway Management Planned: Oral ETT  Additional Equipment: Arterial line  Intra-op Plan:   Post-operative Plan: Extubation in OR  Informed Consent:   Plan Discussed with: CRNA and Surgeon  Anesthesia Plan  Comments:         Anesthesia Quick Evaluation

## 2016-10-31 ENCOUNTER — Encounter (HOSPITAL_COMMUNITY): Payer: Self-pay | Admitting: Interventional Radiology

## 2016-11-01 ENCOUNTER — Encounter: Payer: Self-pay | Admitting: Physician Assistant

## 2016-11-01 ENCOUNTER — Other Ambulatory Visit: Payer: Self-pay

## 2016-11-01 ENCOUNTER — Ambulatory Visit (HOSPITAL_COMMUNITY): Payer: BLUE CROSS/BLUE SHIELD | Attending: Cardiology

## 2016-11-01 ENCOUNTER — Telehealth: Payer: Self-pay | Admitting: *Deleted

## 2016-11-01 DIAGNOSIS — E785 Hyperlipidemia, unspecified: Secondary | ICD-10-CM | POA: Insufficient documentation

## 2016-11-01 DIAGNOSIS — G4733 Obstructive sleep apnea (adult) (pediatric): Secondary | ICD-10-CM | POA: Diagnosis not present

## 2016-11-01 DIAGNOSIS — I340 Nonrheumatic mitral (valve) insufficiency: Secondary | ICD-10-CM | POA: Insufficient documentation

## 2016-11-01 DIAGNOSIS — I10 Essential (primary) hypertension: Secondary | ICD-10-CM | POA: Diagnosis not present

## 2016-11-01 DIAGNOSIS — E669 Obesity, unspecified: Secondary | ICD-10-CM | POA: Insufficient documentation

## 2016-11-01 NOTE — Telephone Encounter (Signed)
-----   Message from Liliane Shi, [place] sent at 11/01/2016  4:47 PM EDT ----- Please call the patient. The echocardiogram demonstrates normal LV function with moderately impaired LV relaxation and mild to moderate leakage of the mitral valve. This is similar to prior echocardiogram. Continue current treatment plan and follow-up as planned. Please fax a copy of this study result to her PCP:  Unk Pinto, MD  Thanks! Richardson Dopp, PA-C    11/01/2016 4:43 PM

## 2016-11-01 NOTE — Telephone Encounter (Signed)
Pt has been notified of echo results and findings by phone with verbal understanding. Pt aware to continue on her current Tx plan. I will forward results to PCP as well. Pt thanked me for my call.

## 2016-11-07 ENCOUNTER — Other Ambulatory Visit: Payer: BLUE CROSS/BLUE SHIELD

## 2016-11-08 ENCOUNTER — Other Ambulatory Visit: Payer: BLUE CROSS/BLUE SHIELD | Admitting: *Deleted

## 2016-11-08 DIAGNOSIS — I10 Essential (primary) hypertension: Secondary | ICD-10-CM

## 2016-11-08 LAB — BASIC METABOLIC PANEL
BUN/Creatinine Ratio: 12 (ref 12–28)
BUN: 12 mg/dL (ref 8–27)
CALCIUM: 9.2 mg/dL (ref 8.7–10.3)
CO2: 22 mmol/L (ref 20–29)
CREATININE: 1 mg/dL (ref 0.57–1.00)
Chloride: 101 mmol/L (ref 96–106)
GFR calc Af Amer: 70 mL/min/{1.73_m2} (ref >59)
GFR, EST NON AFRICAN AMERICAN: 61 mL/min/{1.73_m2} (ref >59)
Glucose: 79 mg/dL (ref 65–99)
Potassium: 4.3 mmol/L (ref 3.5–5.2)
Sodium: 140 mmol/L (ref 134–144)

## 2016-12-13 ENCOUNTER — Telehealth (HOSPITAL_COMMUNITY): Payer: Self-pay | Admitting: Radiology

## 2016-12-13 NOTE — Telephone Encounter (Signed)
Called pt, left VM for her to call about rescheduling her procedure. JM

## 2016-12-18 ENCOUNTER — Other Ambulatory Visit (HOSPITAL_COMMUNITY): Payer: Self-pay | Admitting: Radiology

## 2016-12-18 DIAGNOSIS — I671 Cerebral aneurysm, nonruptured: Secondary | ICD-10-CM

## 2016-12-18 LAB — PLATELET INHIBITION P2Y12: PLATELET FUNCTION P2Y12: 11 [PRU] — AB (ref 194–418)

## 2016-12-20 ENCOUNTER — Telehealth (HOSPITAL_COMMUNITY): Payer: Self-pay | Admitting: *Deleted

## 2016-12-20 NOTE — Telephone Encounter (Signed)
Called and spoke with patient regarding her medication.  Per Dr. Estanislado Pandy patient is to take Brilinta 90mg  in am and 45mg  in pm.  ASA 81mg  daily.  Pt repeated instructions to me.  We will recheck p2Y12 when pt returns for procedure.  Jennifer./Ashley will call to schedule

## 2016-12-22 ENCOUNTER — Telehealth (HOSPITAL_COMMUNITY): Payer: Self-pay | Admitting: Radiology

## 2016-12-22 NOTE — Telephone Encounter (Signed)
Called pt, left VM that her procedure would not be done on 12/25/16 due to her recent P2Y12 results and her medication change by Dr. Estanislado Pandy. She was instructed to come back to [facility] next Wednesday or Thursday for a repeat P2Y12 blood draw to check her numbers again. We will discuss a treatment date pending her results next week. JM

## 2016-12-27 ENCOUNTER — Other Ambulatory Visit (HOSPITAL_COMMUNITY): Payer: Self-pay | Admitting: Radiology

## 2016-12-27 DIAGNOSIS — I729 Aneurysm of unspecified site: Secondary | ICD-10-CM

## 2016-12-27 LAB — PLATELET INHIBITION P2Y12: Platelet Function  P2Y12: 6 [PRU] — ABNORMAL LOW (ref 194–418)

## 2017-01-02 ENCOUNTER — Telehealth (HOSPITAL_COMMUNITY): Payer: Self-pay | Admitting: *Deleted

## 2017-01-02 NOTE — Telephone Encounter (Signed)
Called patient.  Per Dr. Estanislado Pandy patient is to take Brilinta 90mg  daily.   Pt voiced understanding

## 2017-01-04 NOTE — Progress Notes (Signed)
Complete Physical  Assessment and Plan: Essential hypertension - continue medications, DASH diet, exercise and monitor at home. Call if greater than 130/80.  - CBC with Differential/Platelet - BASIC METABOLIC PANEL WITH GFR - Hepatic function panel - TSH - Urinalysis, Routine w reflex microscopic (not at [facility]) - Microalbumin / creatinine urine ratio   Prediabetes Discussed general issues about diabetes pathophysiology and management., Educational material distributed., Suggested low cholesterol diet., Encouraged aerobic exercise., Discussed foot care., Reminded to get yearly retinal exam. - Hemoglobin A1c - Insulin, fasting  Hyperlipidemia -continue medications, check lipids, decrease fatty foods, increase activity. - Lipid panel  Obesity Obesity with co morbidities- long discussion about weight loss, diet, and exercise  Chronic diastolic heart failure Weight up, decrease salt/sugar, lasix daily x 2-3 days, monitor weight, BP stable.    Mitral regurgitation Monitored by cardio, no SOB/CP  Vitamin D deficiency - Vit D  25 hydroxy (rtn osteoporosis monitoring)  Medication management - Magnesium  OSA on CPAP She is on CPAP   Brain aneurysm Control blood pressure, cholesterol, glucose, increase exercise.   Gout of foot, unspecified cause, unspecified chronicity, unspecified laterality - Gout- recheck Uric acid as needed, Diet discussed, continue medications.  Encounter for general adult medical examination with abnormal findings 1 year  BMI 31.0-31.9,adult  Overweight  - long discussion about weight loss, diet, and exercise -recommended diet heavy in fruits and veggies and low in animal meats, cheeses, and dairy products  Acute left-sided low back pain without sciatica Lower back pain - negative straight leg Prednisone was not prescribed, RICE, and exercise given If not better follow up in office or will refer to PT/orthopedics. -     cyclobenzaprine (FLEXERIL)  10 MG tablet; Take 1 tablet (10 mg total) by mouth at bedtime as needed for muscle spasms.   Discussed med's effects and SE's. Screening labs and tests as requested with regular follow-up as recommended. Over 40 minutes of exam, counseling, chart review, and complex, high level critical decision making was performed this visit.  Future Appointments  Date Time Provider [place]  01/21/2018 10:00 AM Vicie Mutters, PA-C [id number] None    HPI  61 y.o. female  presents for a complete physical.  Her blood pressure has been controlled at home, today their BP is BP: 124/80 She does workout, walks daily for 30 mins. She denies chest pain, shortness of breath, dizziness.  She has history of CVA due to aneurysm, PVD and MR, follows with Dr. Sondra Come. She is on brilinta one a day, bASA.  Had stable MRA brain last year. Her weight is up 9 lbs but she states she is not swelling that bad, she is still on soda. She is complaining of cough and SOB.  She works at [employer], going to work 2 more years and retire. 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  She has fallen 3 x in past month due to ice, no LOC, did not hit her head. Having back pain still.  She is not on cholesterol medication and denies myalgias. Her cholesterol is at goal. The cholesterol last visit was:   Lab Results  Component Value Date   CHOL 195 09/06/2016   HDL 137 09/06/2016   LDLCALC 42 10/26/2015   TRIG 101 09/06/2016   CHOLHDL 1.4 09/06/2016   She has been working on diet and exercise for prediabetes, and denies paresthesia of the feet, polydipsia, polyuria and visual disturbances. Last A1C in the office was:  Lab  Results  Component Value Date   HGBA1C 5.3 10/26/2015   She has OSA and is on CPAP, she is on protonix for GERD.  Patient is NOT on allopurinol for gout, she is on colchicine and does not report a recent flare.  Lab Results  Component Value Date   LABURIC 4.2  10/23/2014   BMI is Body mass index is 31.33 kg/m., she is working on diet and exercise.  Wt Readings from Last 3 Encounters:  01/05/17 224 lb 9.6 oz (101.9 kg)  10/30/16 215 lb (97.5 kg)  10/23/16 215 lb (97.5 kg)   Patient is on Vitamin D supplement.   Lab Results  Component Value Date   VD25OH 35 10/26/2015      Current Medications:  Current Outpatient Medications on File Prior to Visit  Medication Sig Dispense Refill  . amLODipine (NORVASC) 10 MG tablet Take 10 mg by mouth daily.    Marland Kitchen aspirin EC 81 MG tablet Take 1 tablet (81 mg total) by mouth daily.    . furosemide (LASIX) 40 MG tablet Take 1 tablet (40 mg total) by mouth daily as needed for edema. 30 tablet 11  . Multiple Vitamins-Minerals (MULTIVITAMIN WITH MINERALS) tablet Take 1 tablet by mouth daily.    Marland Kitchen olmesartan (BENICAR) 40 MG tablet Take 1 tablet (40 mg total) by mouth daily. 90 tablet 3  . Omega-3 Fatty Acids (FISH OIL) 1200 MG CAPS Take 2,400 mg by mouth daily.     Marland Kitchen PREMARIN 0.9 MG tablet TAKE 1 TABLET BY MOUTH ONCE DAILY ( NEED OFFICE VISIT BEFORE FURTHER REFILLS ) 30 tablet 4   No current facility-administered medications on file prior to visit.    Health Maintenance:   Immunization History  Administered Date(s) Administered  . Influenza Split 11/11/2013, 10/23/2014  . Influenza, Seasonal, Injecte, Preservative Fre 10/26/2015  . Influenza-Unspecified 11/25/2012  . Tdap 08/25/2013   Tetanus: 2015 Pneumovax: N/A Prevnar 13: N/A Flu vaccine: DUE this year, we are out in the office Zostavax: N/A LMP: TAH Pap: TAH  MGM:02/2016  DEXA: N/A Colonoscopy: 2011, due 10 years Dr. Collene Mares EGD: N/A CT head 2010 CXR 2015 MRA 08/2014 Renal US 2017 Echo 10/2016, normal EF, mil-mod AR  Last Dental Exam: Has partial/bridges, no dentist.  [facility], 06/2016, goes yearly Patient Care Team: Unk Pinto, MD as PCP - General (Internal Medicine) Sharmon Revere as Physician  Assistant (Physician Assistant) Juanita Craver, MD as Consulting Physician (Gastroenterology) Mcarthur Rossetti, MD as Consulting Physician (Orthopedic Surgery) Unk Pinto, MD as Consulting Physician (Internal Medicine) Luanne Bras, MD as Consulting Physician (Interventional Radiology)  Medical History:  Past Medical History:  Diagnosis Date  . Anemia   . Arthritis   . Chronic diastolic heart failure ([place])   . GERD (gastroesophageal reflux disease)   . Heart murmur   . Hyperlipidemia   . Hypertension may, 1973  . Mitral regurgitation    a. Echo 01/2012 mild LVH, EF 55-65%, Gr 2 DD, mod MR, mild LAE, PASP 36;  b. Echo (02/2013):  EF 55-60%, Gr 1 DD, mild to mod MR, mild LAE (no sig change since prior echo) // c. Echo 10/18: EF 55-60, normal wall motion, grade 2 diastolic dysfunction, trivial AI, mild to moderate MR, mild LAE, trivial PI, PASP 38    . OSA on CPAP    7 yrs  . Peripheral vascular disease ([place])    cerebral aneursym  . Stroke ([place]) 07   no  weakness  . Subarachnoid hemorrhage due to ruptured aneurysm ([place])    2003 - s/p coiling  . Unspecified vitamin D deficiency    Allergies Allergies  Allergen Reactions  . Aspirin Nausea And Vomiting    Can take coated Aspirin  . Ace Inhibitors Nausea And Vomiting   SURGICAL HISTORY She  has a past surgical history that includes Aneurysm coiling; head surgery; Abdominal hysterectomy; Tonsillectomy and adenoidectomy; Radiology with anesthesia (N/A, 04/23/2013); IR 3D Independent Wkst (09/04/2016); IR Angiogram Extremity Left (09/04/2016); IR ANGIO INTRA EXTRACRAN SEL COM CAROTID INNOMINATE BILAT MOD SED (09/04/2016); IR ANGIO VERTEBRAL SEL VERTEBRAL BILAT MOD SED (09/04/2016); IR Radiologist Eval & Mgmt (10/11/2016); and Radiology with anesthesia (N/A, 10/30/2016).   FAMILY HISTORY Her family history includes Hypertension in her father, sister, sister, and sister; Kidney disease in her father.   SOCIAL HISTORY She  reports  that she quit smoking about 17 years ago. Her smoking use included cigarettes. She has a 0.75 pack-year smoking history. she has never used smokeless tobacco. She reports that she does not drink alcohol or use drugs.  Review of Systems: Review of Systems  Constitutional: Negative.   HENT: Negative.   Respiratory: Positive for cough (dry). Negative for hemoptysis, sputum production, shortness of breath and wheezing.   Cardiovascular: Negative.  Negative for chest pain, palpitations and leg swelling (resolved).  Gastrointestinal: Positive for heartburn. Negative for abdominal pain, blood in stool, constipation, diarrhea, melena, nausea and vomiting.  Genitourinary: Negative.   Musculoskeletal: Positive for back pain and joint pain. Negative for falls, myalgias and neck pain.  Skin: Negative.   Neurological: Negative.  Negative for dizziness and headaches.  Psychiatric/Behavioral: Negative.  Negative for depression, hallucinations, memory loss, substance abuse and suicidal ideas. The patient is not nervous/anxious and does not have insomnia.     Physical Exam: Estimated body mass index is 31.33 kg/m as calculated from the following:   Height as of this encounter: 5\' 11"  (1.803 m).   Weight as of this encounter: 224 lb 9.6 oz (101.9 kg). BP 124/80   Pulse 61   Temp (!) 97.5 F (36.4 C)   Resp 14   Ht 5\' 11"  (1.803 m)   Wt 224 lb 9.6 oz (101.9 kg)   SpO2 97%   BMI 31.33 kg/m  General Appearance: Well nourished, in no apparent distress.  Eyes: PERRLA, EOMs, conjunctiva no swelling or erythema, normal fundi and vessels.  Sinuses: No Frontal/maxillary tenderness  ENT/Mouth: Ext aud canals clear, normal light reflex with TMs without erythema, bulging. Good dentition. No erythema, swelling, or exudate on post pharynx. Tonsils not swollen or erythematous. Hearing normal.  Neck: Supple, thyroid slightly enlarged. No bruits  Respiratory: Respiratory effort normal, BS equal bilaterally without  rales, rhonchi, wheezing or stridor.  Cardio: RRR with holosystolic murmur LSB, without rubs or gallops. Brisk peripheral pulses with 2+ edema.  Chest: symmetric, with normal excursions and percussion.  Breasts: declines  Abdomen: Soft, nontender, no guarding, rebound, hernias, masses, or organomegaly.  Lymphatics: Non tender without lymphadenopathy.  Genitourinary: defer Musculoskeletal: Full ROM all peripheral extremities,5/5 strength, and antalgic gait. Left sided back pain + tender to palpation, pain with rotation and flexion, negative straight leg raise.  Skin: Warm, dry without rashes, lesions, ecchymosis. Neuro: Cranial nerves intact, reflexes equal bilaterally. Normal muscle tone, no cerebellar symptoms. Sensation intact.  Psych: Awake and oriented X 3, normal affect, Insight and Judgment appropriate.   EKG: defer. AORTA SCAN: defer  Vicie Mutters 11:25 AM [facility]

## 2017-01-05 ENCOUNTER — Ambulatory Visit: Payer: BLUE CROSS/BLUE SHIELD | Admitting: Physician Assistant

## 2017-01-05 ENCOUNTER — Encounter: Payer: Self-pay | Admitting: Physician Assistant

## 2017-01-05 VITALS — BP 124/80 | HR 61 | Temp 97.5°F | Resp 14 | Ht 71.0 in | Wt 224.6 lb

## 2017-01-05 DIAGNOSIS — M545 Low back pain, unspecified: Secondary | ICD-10-CM

## 2017-01-05 DIAGNOSIS — R7303 Prediabetes: Secondary | ICD-10-CM

## 2017-01-05 DIAGNOSIS — I10 Essential (primary) hypertension: Secondary | ICD-10-CM

## 2017-01-05 DIAGNOSIS — I671 Cerebral aneurysm, nonruptured: Secondary | ICD-10-CM

## 2017-01-05 DIAGNOSIS — E559 Vitamin D deficiency, unspecified: Secondary | ICD-10-CM

## 2017-01-05 DIAGNOSIS — Z6831 Body mass index (BMI) 31.0-31.9, adult: Secondary | ICD-10-CM

## 2017-01-05 DIAGNOSIS — Z79899 Other long term (current) drug therapy: Secondary | ICD-10-CM

## 2017-01-05 DIAGNOSIS — M109 Gout, unspecified: Secondary | ICD-10-CM

## 2017-01-05 DIAGNOSIS — G4733 Obstructive sleep apnea (adult) (pediatric): Secondary | ICD-10-CM

## 2017-01-05 DIAGNOSIS — I5032 Chronic diastolic (congestive) heart failure: Secondary | ICD-10-CM

## 2017-01-05 DIAGNOSIS — I340 Nonrheumatic mitral (valve) insufficiency: Secondary | ICD-10-CM

## 2017-01-05 DIAGNOSIS — E785 Hyperlipidemia, unspecified: Secondary | ICD-10-CM

## 2017-01-05 DIAGNOSIS — Z0000 Encounter for general adult medical examination without abnormal findings: Secondary | ICD-10-CM | POA: Diagnosis not present

## 2017-01-05 DIAGNOSIS — Z9989 Dependence on other enabling machines and devices: Secondary | ICD-10-CM

## 2017-01-05 MED ORDER — CYCLOBENZAPRINE HCL 10 MG PO TABS: 10.0000 mg | ORAL_TABLET | Freq: Every evening | ORAL | 0 refills | Status: DC | PRN

## 2017-01-05 NOTE — Patient Instructions (Addendum)
Take lasix once a day x 2-3 times a day and monitor weight, if still SOB, follow up Dr. Sondra Come.  STOP SODA  Do heating pad  flexeril if needed at bedtime for muscle spasm. This can be taken up to every 8 hours, but causes sedation, so should not drive or operate heavy machinery while taking this medicine.  You can take tylenol (500mg ) or tylenol arthritis (650mg ) with the meloxicam/antiinflammatories. The max you can take of tylenol a day is 3000mg  daily, this is a max of 6 pills a day of the regular tyelnol (500mg ) or a max of 4 a day of the tylenol arthritis (650mg ) as long as no other medications you are taking contain tylenol.   Go to the ER if you have any new weakness in your legs, have trouble controlling your urine or bowels, or have worsening pain.   If you are not better in 1-3 month we will refer you to ortho   Back pain Rehab Ask your health care provider which exercises are safe for you. Do exercises exactly as told by your health care provider and adjust them as directed. It is normal to feel mild stretching, pulling, tightness, or discomfort as you do these exercises, but you should stop right away if you feel sudden pain or your pain gets worse.Do not begin these exercises until told by your health care provider. Stretching and range of motion exercises These exercises warm up your muscles and joints and improve the movement and flexibility of your hips and your back. These exercises also help to relieve pain, numbness, and tingling. Exercise A: Sciatic nerve glide 1. Sit in a chair with your head facing down toward your chest. Place your hands behind your back. Let your shoulders slump forward. 2. Slowly straighten one of your knees while you tilt your head back as if you are looking toward the ceiling. Only straighten your leg as far as you can without making your symptoms worse. 3. Hold for __________ seconds. 4. Slowly return to the starting position. 5. Repeat with your  other leg. Repeat __________ times. Complete this exercise __________ times a day. Exercise B: Knee to chest with hip adduction and internal rotation  1. Lie on your back on a firm surface with both legs straight. 2. Bend one of your knees and move it up toward your chest until you feel a gentle stretch in your lower back and buttock. Then, move your knee toward the shoulder that is on the opposite side from your leg. ? Hold your leg in this position by holding onto the front of your knee. 3. Hold for __________ seconds. 4. Slowly return to the starting position. 5. Repeat with your other leg. Repeat __________ times. Complete this exercise __________ times a day. Exercise C: Prone extension on elbows  1. Lie on your abdomen on a firm surface. A bed may be too soft for this exercise. 2. Prop yourself up on your elbows. 3. Use your arms to help lift your chest up until you feel a gentle stretch in your abdomen and your lower back. ? This will place some of your body weight on your elbows. If this is uncomfortable, try stacking pillows under your chest. ? Your hips should stay down, against the surface that you are lying on. Keep your hip and back muscles relaxed. 4. Hold for __________ seconds. 5. Slowly relax your upper body and return to the starting position. Repeat __________ times. Complete this exercise __________ times a  day. Strengthening exercises These exercises build strength and endurance in your back. Endurance is the ability to use your muscles for a long time, even after they get tired. Exercise D: Pelvic tilt 1. Lie on your back on a firm surface. Bend your knees and keep your feet flat. 2. Tense your abdominal muscles. Tip your pelvis up toward the ceiling and flatten your lower back into the floor. ? To help with this exercise, you may place a small towel under your lower back and try to push your back into the towel. 3. Hold for __________ seconds. 4. Let your muscles  relax completely before you repeat this exercise. Repeat __________ times. Complete this exercise __________ times a day. Exercise E: Alternating arm and leg raises  1. Get on your hands and knees on a firm surface. If you are on a hard floor, you may want to use padding to cushion your knees, such as an exercise mat. 2. Line up your arms and legs. Your hands should be below your shoulders, and your knees should be below your hips. 3. Lift your left leg behind you. At the same time, raise your right arm and straighten it in front of you. ? Do not lift your leg higher than your hip. ? Do not lift your arm higher than your shoulder. ? Keep your abdominal and back muscles tight. ? Keep your hips facing the ground. ? Do not arch your back. ? Keep your balance carefully, and do not hold your breath. 4. Hold for __________ seconds. 5. Slowly return to the starting position and repeat with your right leg and your left arm. Repeat __________ times. Complete this exercise __________ times a day. Posture and body mechanics  Body mechanics refers to the movements and positions of your body while you do your daily activities. Posture is part of body mechanics. Good posture and healthy body mechanics can help to relieve stress in your body's tissues and joints. Good posture means that your spine is in its natural S-curve position (your spine is neutral), your shoulders are pulled back slightly, and your head is not tipped forward. The following are general guidelines for applying improved posture and body mechanics to your everyday activities. Standing   When standing, keep your spine neutral and your feet about hip-width apart. Keep a slight bend in your knees. Your ears, shoulders, and hips should line up.  When you do a task in which you stand in one place for a long time, place one foot up on a stable object that is 2-4 inches (5-10 cm) high, such as a footstool. This helps keep your spine  neutral. Sitting   When sitting, keep your spine neutral and keep your feet flat on the floor. Use a footrest, if necessary, and keep your thighs parallel to the floor. Avoid rounding your shoulders, and avoid tilting your head forward.  When working at a desk or a computer, keep your desk at a height where your hands are slightly lower than your elbows. Slide your chair under your desk so you are close enough to maintain good posture.  When working at a computer, place your monitor at a height where you are looking straight ahead and you do not have to tilt your head forward or downward to look at the screen. Resting   When lying down and resting, avoid positions that are most painful for you.  If you have pain with activities such as sitting, bending, stooping, or squatting (flexion-based activities),  lie in a position in which your body does not bend very much. For example, avoid curling up on your side with your arms and knees near your chest (fetal position).  If you have pain with activities such as standing for a long time or reaching with your arms (extension-based activities), lie with your spine in a neutral position and bend your knees slightly. Try the following positions: ? Lying on your side with a pillow between your knees. ? Lying on your back with a pillow under your knees. Lifting   When lifting objects, keep your feet at least shoulder-width apart and tighten your abdominal muscles.  Bend your knees and hips and keep your spine neutral. It is important to lift using the strength of your legs, not your back. Do not lock your knees straight out.  Always ask for help to lift heavy or awkward objects. This information is not intended to replace advice given to you by your health care provider. Make sure you discuss any questions you have with your health care provider. Document Released: 01/09/2005 Document Revised: 09/16/2015 Document Reviewed: 09/25/2014 Elsevier  Interactive Patient Education  [employer].

## 2017-01-06 LAB — CBC WITH DIFFERENTIAL/PLATELET
BASOS ABS: 50 {cells}/uL (ref 0–200)
Basophils Relative: 0.7 %
EOS PCT: 3.7 %
Eosinophils Absolute: 263 cells/uL (ref 15–500)
HCT: 30.3 % — ABNORMAL LOW (ref 35.0–45.0)
HEMOGLOBIN: 10 g/dL — AB (ref 11.7–15.5)
Lymphs Abs: 3032 cells/uL (ref 850–3900)
MCH: 27 pg (ref 27.0–33.0)
MCHC: 33 g/dL (ref 32.0–36.0)
MCV: 81.7 fL (ref 80.0–100.0)
MONOS PCT: 5.5 %
MPV: 10.2 fL (ref 7.5–12.5)
NEUTROS ABS: 3365 {cells}/uL (ref 1500–7800)
Neutrophils Relative %: 47.4 %
Platelets: 275 10*3/uL (ref 140–400)
RBC: 3.71 10*6/uL — ABNORMAL LOW (ref 3.80–5.10)
RDW: 15.1 % — AB (ref 11.0–15.0)
Total Lymphocyte: 42.7 %
WBC mixed population: 391 cells/uL (ref 200–950)
WBC: 7.1 10*3/uL (ref 3.8–10.8)

## 2017-01-06 LAB — URINALYSIS, ROUTINE W REFLEX MICROSCOPIC
BILIRUBIN URINE: NEGATIVE
Bacteria, UA: NONE SEEN /HPF
Glucose, UA: NEGATIVE
HGB URINE DIPSTICK: NEGATIVE
Hyaline Cast: NONE SEEN /LPF
KETONES UR: NEGATIVE
LEUKOCYTES UA: NEGATIVE
NITRITE: NEGATIVE
RBC / HPF: NONE SEEN /HPF (ref 0–2)
SPECIFIC GRAVITY, URINE: 1.015 (ref 1.001–1.03)
WBC UA: NONE SEEN /HPF (ref 0–5)

## 2017-01-06 LAB — HEPATIC FUNCTION PANEL
AG RATIO: 1.2 (calc) (ref 1.0–2.5)
ALKALINE PHOSPHATASE (APISO): 48 U/L (ref 33–130)
ALT: 6 U/L (ref 6–29)
AST: 13 U/L (ref 10–35)
Albumin: 4 g/dL (ref 3.6–5.1)
BILIRUBIN TOTAL: 0.3 mg/dL (ref 0.2–1.2)
Bilirubin, Direct: 0.1 mg/dL (ref 0.0–0.2)
Globulin: 3.3 g/dL (calc) (ref 1.9–3.7)
Indirect Bilirubin: 0.2 mg/dL (calc) (ref 0.2–1.2)
TOTAL PROTEIN: 7.3 g/dL (ref 6.1–8.1)

## 2017-01-06 LAB — MICROALBUMIN / CREATININE URINE RATIO
Creatinine, Urine: 113 mg/dL (ref 20–275)
Microalb Creat Ratio: 731 mcg/mg creat — ABNORMAL HIGH (ref <30)
Microalb, Ur: 82.6 mg/dL

## 2017-01-06 LAB — MAGNESIUM: MAGNESIUM: 1.5 mg/dL (ref 1.5–2.5)

## 2017-01-06 LAB — VITAMIN D 25 HYDROXY (VIT D DEFICIENCY, FRACTURES): VIT D 25 HYDROXY: 27 ng/mL — AB (ref 30–100)

## 2017-01-06 LAB — BASIC METABOLIC PANEL WITH GFR
BUN/Creatinine Ratio: 12 (calc) (ref 6–22)
BUN: 12 mg/dL (ref 7–25)
CALCIUM: 9.4 mg/dL (ref 8.6–10.4)
CO2: 27 mmol/L (ref 20–32)
CREATININE: 1.03 mg/dL — AB (ref 0.50–0.99)
Chloride: 106 mmol/L (ref 98–110)
GFR, EST NON AFRICAN AMERICAN: 59 mL/min/{1.73_m2} — AB (ref >=60)
GFR, Est African American: 68 mL/min/{1.73_m2} (ref >=60)
GLUCOSE: 85 mg/dL (ref 65–99)
Potassium: 4.6 mmol/L (ref 3.5–5.3)
SODIUM: 140 mmol/L (ref 135–146)

## 2017-01-06 LAB — HEMOGLOBIN A1C
HEMOGLOBIN A1C: 5.3 %{Hb} (ref <5.7)
Mean Plasma Glucose: 105 (calc)
eAG (mmol/L): 5.8 (calc)

## 2017-01-06 LAB — TSH: TSH: 2.09 m[IU]/L (ref 0.40–4.50)

## 2017-01-06 LAB — LIPID PANEL
Cholesterol: 192 mg/dL (ref <200)
HDL: 141 mg/dL (ref >50)
LDL CHOLESTEROL (CALC): 31 mg/dL
NON-HDL CHOLESTEROL (CALC): 51 mg/dL (ref <130)
Total CHOL/HDL Ratio: 1.4 (calc) (ref <5.0)
Triglycerides: 114 mg/dL (ref <150)

## 2017-01-08 NOTE — Progress Notes (Signed)
LVM for return call on lab results

## 2017-01-23 DIAGNOSIS — R569 Unspecified convulsions: Secondary | ICD-10-CM

## 2017-01-23 HISTORY — DX: Unspecified convulsions: R56.9

## 2017-01-26 ENCOUNTER — Telehealth (HOSPITAL_COMMUNITY): Payer: Self-pay | Admitting: Radiology

## 2017-01-26 NOTE — Telephone Encounter (Signed)
Attempted to call pt multiple times on both numbers. No answer and no VM. JM

## 2017-01-28 ENCOUNTER — Other Ambulatory Visit: Payer: Self-pay | Admitting: Physician Assistant

## 2017-01-30 ENCOUNTER — Other Ambulatory Visit (HOSPITAL_COMMUNITY): Payer: Self-pay | Admitting: Radiology

## 2017-01-30 DIAGNOSIS — I130 Hypertensive heart and chronic kidney disease with heart failure and stage 1 through stage 4 chronic kidney disease, or unspecified chronic kidney disease: Secondary | ICD-10-CM | POA: Diagnosis not present

## 2017-01-30 DIAGNOSIS — I051 Rheumatic mitral insufficiency: Secondary | ICD-10-CM | POA: Diagnosis not present

## 2017-01-30 DIAGNOSIS — Z7982 Long term (current) use of aspirin: Secondary | ICD-10-CM | POA: Diagnosis not present

## 2017-01-30 DIAGNOSIS — G4733 Obstructive sleep apnea (adult) (pediatric): Secondary | ICD-10-CM | POA: Diagnosis not present

## 2017-01-30 DIAGNOSIS — Z8673 Personal history of transient ischemic attack (TIA), and cerebral infarction without residual deficits: Secondary | ICD-10-CM | POA: Diagnosis not present

## 2017-01-30 DIAGNOSIS — I671 Cerebral aneurysm, nonruptured: Secondary | ICD-10-CM

## 2017-01-30 DIAGNOSIS — I5032 Chronic diastolic (congestive) heart failure: Secondary | ICD-10-CM | POA: Diagnosis not present

## 2017-01-30 DIAGNOSIS — Z87891 Personal history of nicotine dependence: Secondary | ICD-10-CM | POA: Diagnosis not present

## 2017-01-30 DIAGNOSIS — E785 Hyperlipidemia, unspecified: Secondary | ICD-10-CM | POA: Diagnosis not present

## 2017-01-30 DIAGNOSIS — Z01818 Encounter for other preprocedural examination: Secondary | ICD-10-CM | POA: Diagnosis not present

## 2017-01-30 DIAGNOSIS — D649 Anemia, unspecified: Secondary | ICD-10-CM | POA: Diagnosis not present

## 2017-01-30 DIAGNOSIS — Z79899 Other long term (current) drug therapy: Secondary | ICD-10-CM | POA: Diagnosis not present

## 2017-01-30 DIAGNOSIS — Z8679 Personal history of other diseases of the circulatory system: Secondary | ICD-10-CM | POA: Diagnosis not present

## 2017-01-30 DIAGNOSIS — N182 Chronic kidney disease, stage 2 (mild): Secondary | ICD-10-CM | POA: Diagnosis not present

## 2017-01-30 LAB — PLATELET INHIBITION P2Y12: PLATELET FUNCTION P2Y12: 60 [PRU] — AB (ref 194–418)

## 2017-02-01 ENCOUNTER — Telehealth (HOSPITAL_COMMUNITY): Payer: Self-pay | Admitting: *Deleted

## 2017-02-01 NOTE — Telephone Encounter (Signed)
Called and spoke with patient regarding p2y12 results.  Per Dr. Estanislado Pandy patient is to conintue current dose of Brilinta and ASA.  Pt voiced understanding

## 2017-02-02 ENCOUNTER — Telehealth (HOSPITAL_COMMUNITY): Payer: Self-pay | Admitting: *Deleted

## 2017-02-02 NOTE — Telephone Encounter (Signed)
Called and refilled Brilinta per Dr. Estanislado Pandy request,

## 2017-02-12 ENCOUNTER — Other Ambulatory Visit: Payer: Self-pay | Admitting: Radiology

## 2017-02-13 NOTE — Pre-Procedure Instructions (Signed)
Yolanda Espinoza  02/13/2017      [facility] [place], [place] - [address] [place] [address] Phone: [phone] Fax: [phone]    Your procedure is scheduled on January 30  Report to [facility] Admitting at 0700 A.M.  Call this number if you have problems the morning of surgery:  [phone]   Remember:  Do not eat food or drink liquids after midnight.  Continue all medications as directed by your physician except follow these medication instructions before surgery below   Take these medicines the morning of surgery with A SIP OF WATER  amLODipine (NORVASC)  aspirin cyclobenzaprine (FLEXERIL) ticagrelor (BRILINTA) PREMARIN  7 days prior to surgery STOP taking any Aleve, Naproxen, Ibuprofen, Motrin, Advil, Goody's, BC's, all herbal medications, fish oil, and all vitamins    Do not wear jewelry, make-up or nail polish.  Do not wear lotions, powders, or perfumes, or deodorant.  Do not shave 48 hours prior to surgery.  Men may shave face and neck.  Do not bring valuables to the hospital.  [facility] is not responsible for any belongings or valuables.  Contacts, dentures or bridgework may not be worn into surgery.  Leave your suitcase in the car.  After surgery it may be brought to your room.  For patients admitted to the hospital, discharge time will be determined by your treatment team.  Patients discharged the day of surgery will not be allowed to drive home.    Special instructions:   [facility]- Preparing For Surgery  Before surgery, you can play an important role. Because skin is not sterile, your skin needs to be as free of germs as possible. You can reduce the number of germs on your skin by washing with CHG (chlorahexidine gluconate) Soap before surgery.  CHG is an antiseptic cleaner which kills germs and bonds with the skin to continue killing germs even after washing.  Please do not use if  you have an allergy to CHG or antibacterial soaps. If your skin becomes reddened/irritated stop using the CHG.  Do not shave (including legs and underarms) for at least 48 hours prior to first CHG shower. It is OK to shave your face.  Please follow these instructions carefully.   1. Shower the NIGHT BEFORE SURGERY and the MORNING OF SURGERY with CHG.   2. If you chose to wash your hair, wash your hair first as usual with your normal shampoo.  3. After you shampoo, rinse your hair and body thoroughly to remove the shampoo.  4. Use CHG as you would any other liquid soap. You can apply CHG directly to the skin and wash gently with a scrungie or a clean washcloth.   5. Apply the CHG Soap to your body ONLY FROM THE NECK DOWN.  Do not use on open wounds or open sores. Avoid contact with your eyes, ears, mouth and genitals (private parts). Wash Face and genitals (private parts)  with your normal soap.  6. Wash thoroughly, paying special attention to the area where your surgery will be performed.  7. Thoroughly rinse your body with warm water from the neck down.  8. DO NOT shower/wash with your normal soap after using and rinsing off the CHG Soap.  9. Pat yourself dry with a CLEAN TOWEL.  10. Wear CLEAN PAJAMAS to bed the night before surgery, wear comfortable clothes the morning of surgery  11. Place CLEAN SHEETS on your  bed the night of your first shower and DO NOT SLEEP WITH PETS.    Day of Surgery: Do not apply any deodorants/lotions. Please wear clean clothes to the hospital/surgery center.      Please read over the following fact sheets that you were given.

## 2017-02-14 ENCOUNTER — Other Ambulatory Visit: Payer: Self-pay

## 2017-02-14 ENCOUNTER — Other Ambulatory Visit: Payer: Self-pay | Admitting: Radiology

## 2017-02-14 ENCOUNTER — Encounter (HOSPITAL_COMMUNITY)
Admission: RE | Admit: 2017-02-14 | Discharge: 2017-02-14 | Disposition: A | Discharge status: Home / Self Care | Payer: BLUE CROSS/BLUE SHIELD | Source: Ambulatory Visit | Attending: Interventional Radiology | Admitting: Interventional Radiology

## 2017-02-14 ENCOUNTER — Encounter (HOSPITAL_COMMUNITY): Payer: Self-pay

## 2017-02-14 DIAGNOSIS — I5032 Chronic diastolic (congestive) heart failure: Secondary | ICD-10-CM | POA: Diagnosis not present

## 2017-02-14 DIAGNOSIS — Z79899 Other long term (current) drug therapy: Secondary | ICD-10-CM | POA: Insufficient documentation

## 2017-02-14 DIAGNOSIS — Z8673 Personal history of transient ischemic attack (TIA), and cerebral infarction without residual deficits: Secondary | ICD-10-CM | POA: Insufficient documentation

## 2017-02-14 DIAGNOSIS — N182 Chronic kidney disease, stage 2 (mild): Secondary | ICD-10-CM | POA: Insufficient documentation

## 2017-02-14 DIAGNOSIS — Z8679 Personal history of other diseases of the circulatory system: Secondary | ICD-10-CM | POA: Insufficient documentation

## 2017-02-14 DIAGNOSIS — G4733 Obstructive sleep apnea (adult) (pediatric): Secondary | ICD-10-CM | POA: Diagnosis not present

## 2017-02-14 DIAGNOSIS — D649 Anemia, unspecified: Secondary | ICD-10-CM | POA: Diagnosis not present

## 2017-02-14 DIAGNOSIS — Z87891 Personal history of nicotine dependence: Secondary | ICD-10-CM | POA: Insufficient documentation

## 2017-02-14 DIAGNOSIS — Z01818 Encounter for other preprocedural examination: Secondary | ICD-10-CM | POA: Diagnosis not present

## 2017-02-14 DIAGNOSIS — I051 Rheumatic mitral insufficiency: Secondary | ICD-10-CM | POA: Insufficient documentation

## 2017-02-14 DIAGNOSIS — E785 Hyperlipidemia, unspecified: Secondary | ICD-10-CM | POA: Insufficient documentation

## 2017-02-14 DIAGNOSIS — I130 Hypertensive heart and chronic kidney disease with heart failure and stage 1 through stage 4 chronic kidney disease, or unspecified chronic kidney disease: Secondary | ICD-10-CM | POA: Diagnosis not present

## 2017-02-14 DIAGNOSIS — Z7982 Long term (current) use of aspirin: Secondary | ICD-10-CM | POA: Insufficient documentation

## 2017-02-14 LAB — PLATELET INHIBITION P2Y12: PLATELET FUNCTION P2Y12: 46 [PRU] — AB (ref 194–418)

## 2017-02-14 LAB — PROTIME-INR
INR: 0.92
PROTHROMBIN TIME: 12.3 s (ref 11.4–15.2)

## 2017-02-14 LAB — COMPREHENSIVE METABOLIC PANEL
ALT: 8 U/L — ABNORMAL LOW (ref 14–54)
ANION GAP: 11 (ref 5–15)
AST: 17 U/L (ref 15–41)
Albumin: 3.7 g/dL (ref 3.5–5.0)
Alkaline Phosphatase: 43 U/L (ref 38–126)
BILIRUBIN TOTAL: 0.4 mg/dL (ref 0.3–1.2)
BUN: 11 mg/dL (ref 6–20)
CHLORIDE: 105 mmol/L (ref 101–111)
CO2: 22 mmol/L (ref 22–32)
Calcium: 9.2 mg/dL (ref 8.9–10.3)
Creatinine, Ser: 1.05 mg/dL — ABNORMAL HIGH (ref 0.44–1.00)
GFR calc Af Amer: >60 mL/min (ref >60)
GFR, EST NON AFRICAN AMERICAN: 56 mL/min — AB (ref >60)
Glucose, Bld: 109 mg/dL — ABNORMAL HIGH (ref 65–99)
POTASSIUM: 4 mmol/L (ref 3.5–5.1)
Sodium: 138 mmol/L (ref 135–145)
TOTAL PROTEIN: 7 g/dL (ref 6.5–8.1)

## 2017-02-14 LAB — CBC WITH DIFFERENTIAL/PLATELET
BASOS PCT: 1 %
Basophils Absolute: 0 10*3/uL (ref 0.0–0.1)
EOS PCT: 3 %
Eosinophils Absolute: 0.2 10*3/uL (ref 0.0–0.7)
HCT: 30.8 % — ABNORMAL LOW (ref 36.0–46.0)
Hemoglobin: 9.8 g/dL — ABNORMAL LOW (ref 12.0–15.0)
Lymphocytes Relative: 34 %
Lymphs Abs: 2.5 10*3/uL (ref 0.7–4.0)
MCH: 26.9 pg (ref 26.0–34.0)
MCHC: 31.8 g/dL (ref 30.0–36.0)
MCV: 84.6 fL (ref 78.0–100.0)
MONO ABS: 0.3 10*3/uL (ref 0.1–1.0)
MONOS PCT: 4 %
Neutro Abs: 4.2 10*3/uL (ref 1.7–7.7)
Neutrophils Relative %: 58 %
PLATELETS: 240 10*3/uL (ref 150–400)
RBC: 3.64 MIL/uL — ABNORMAL LOW (ref 3.87–5.11)
RDW: 15 % (ref 11.5–15.5)
WBC: 7.3 10*3/uL (ref 4.0–10.5)

## 2017-02-14 LAB — APTT: APTT: 32 s (ref 24–36)

## 2017-02-14 NOTE — Progress Notes (Addendum)
PCP - Dr. Vicie Mutters  Cardiologist - Dr. Acie Fredrickson  Chest x-ray - Denies  EKG - 10/23/16 (E)  Stress Test - Denies  ECHO - 11/01/2016 (E)  Cardiac Cath - Denies  Sleep Study - Yes- Positive CPAP - Yes- Told to bring mask DOS  LABS- 02/14/17: CBC w/D, CMP, PT, PTT, P2Y12  HA1C- 01/05/17 5.3  Anesthesia- Yes. Previous note and Surgical order  Pt denies having chest pain, sob, or fever at this time. All instructions explained to the pt, with a verbal understanding of the material. Pt agrees to go over the instructions while at home for a better understanding. The opportunity to ask questions was provided.

## 2017-02-15 NOTE — Progress Notes (Signed)
Anesthesia Chart Review:  Pt is a 62 year old female scheduled for cerebral arteriogram with possible left internal carotid artery aneurysm embolization on 02/21/2017 with Luanne Bras, MD  - Procedure was originally scheduled for 10/30/16 but was postponed due to suboptimal platelet inhibition   - PCP is Unk Pinto, MD - Cardiologist is Mertie Moores, MD. Last office visit 10/23/16 with Richardson Dopp, MD - Nephrologist is Donato Heinz, MD   PMH includes:   Chronic diastolic HF, mitral regurgitation, HTN, subarachnoid hemorrhage due to ruptured aneurysm (s/p coiling 2003), hyperlipidemia, anemia, stroke (2007), OSA, CKD (stage II).  Former smoker. BMI 30  Medications include: amlodipine, ASA 81mg , iron, lasix, olmesartan, brilinta   BP (!) 157/79   Pulse 69   Temp 36.7 C   Resp 20   Ht 5\' 11"  (1.803 m)   Wt 216 lb 3.2 oz (98.1 kg)   SpO2 96%   BMI 30.15 kg/m    Preoperative labs reviewed.   - P2y12 55.   - H/H 9.8/30.8 - Dr. Estanislado Pandy plans to recheck CMET, CBC, p2y12, and PT day of surgery   EKG 10/23/16: NSR.   Echo 11/01/16:  - Left ventricle: The cavity size was moderately dilated. Systolic function was normal. The estimated ejection fraction was in the range of 55% to 60%. Wall motion was normal; there were no regional wall motion abnormalities. Features are consistent with a pseudonormal left ventricular filling pattern, with concomitant abnormal relaxation and increased filling pressure (grade 2 diastolic dysfunction). - Aortic valve: There was trivial regurgitation. - Mitral valve: There was mild to moderate regurgitation directed eccentrically. - Left atrium: The atrium was mildly dilated. - Pulmonic valve: There was trivial regurgitation. - Pulmonary arteries: PA peak pressure: 38 mm Hg (S).  If labs acceptable day of surgery, I anticipate pt can proceed with surgery as scheduled.   Willeen Cass, FNP-BC [facility]/Anesthesiology Phone: [phone] 02/15/2017 1:30 PM

## 2017-02-20 ENCOUNTER — Other Ambulatory Visit: Payer: Self-pay | Admitting: General Surgery

## 2017-02-20 ENCOUNTER — Other Ambulatory Visit: Payer: Self-pay | Admitting: Radiology

## 2017-02-20 DIAGNOSIS — D509 Iron deficiency anemia, unspecified: Secondary | ICD-10-CM | POA: Diagnosis not present

## 2017-02-20 DIAGNOSIS — Z86010 Personal history of colonic polyps: Secondary | ICD-10-CM | POA: Diagnosis not present

## 2017-02-20 DIAGNOSIS — K219 Gastro-esophageal reflux disease without esophagitis: Secondary | ICD-10-CM | POA: Diagnosis not present

## 2017-02-20 NOTE — Anesthesia Preprocedure Evaluation (Addendum)
Anesthesia Evaluation  Patient identified by MRN, date of birth, ID band Patient awake    Reviewed: Allergy & Precautions, H&P , NPO status , Patient's Chart, lab work & pertinent test results  History of Anesthesia Complications Negative for: history of anesthetic complications  Airway Mallampati: II  TM Distance: >3 FB Neck ROM: Full    Dental  (+) Upper Dentures, Partial Lower, Missing, Dental Advisory Given,    Pulmonary sleep apnea and Continuous Positive Airway Pressure Ventilation , former smoker,    Pulmonary exam normal breath sounds clear to auscultation       Cardiovascular hypertension, Pt. on medications + Peripheral Vascular Disease and +CHF  Normal cardiovascular exam+ Valvular Problems/Murmurs MR  Rhythm:Regular Rate:Normal + Systolic murmurs Diastolic HF, mitral regurgitation, heart murmur, hyperlipdemia Cerebral aneurysm Echo 11/01/2016-- Left ventricle: The cavity size was moderately dilated. Systolic   function was normal. The estimated ejection fraction was in the   range of 55% to 60%. Wall motion was normal; there were no   regional wall motion abnormalities. Features are consistent with   a pseudonormal left ventricular filling pattern, with concomitant   abnormal relaxation and increased filling pressure (grade 2   diastolic dysfunction). - Aortic valve: There was trivial regurgitation. - Mitral valve: There was mild to moderate regurgitation directed   eccentrically. - Left atrium: The atrium was mildly dilated. - Pulmonic valve: There was trivial regurgitation. - Pulmonary arteries: PA peak pressure: 38 mm Hg (S).    Neuro/Psych S/p [facility] 2003 L PCOM aneurysm stent/coil 2003 L ACA aneurysm stent/coil 2004 CVA, No Residual Symptoms negative psych ROS   GI/Hepatic Neg liver ROS, GERD  Medicated and Controlled,  Endo/Other  Obesity   Renal/GU negative Renal ROS  negative genitourinary    Musculoskeletal negative musculoskeletal ROS (+) Arthritis ,   Abdominal Normal abdominal exam  (+)   Peds  Hematology  (+) anemia ,   Anesthesia Other Findings   Reproductive/Obstetrics negative OB ROS                          Anesthesia Physical  Anesthesia Plan  ASA: III  Anesthesia Plan: General   Post-op Pain Management:    Induction: Intravenous  PONV Risk Score and Plan: 3 and Ondansetron, Dexamethasone, Midazolam and Propofol infusion  Airway Management Planned: Oral ETT  Additional Equipment: Arterial line  Intra-op Plan:   Post-operative Plan: Extubation in OR  Informed Consent: I have reviewed the patients History and Physical, chart, labs and discussed the procedure including the risks, benefits and alternatives for the proposed anesthesia with the patient or authorized representative who has indicated his/her understanding and acceptance.   Dental advisory given  Plan Discussed with: CRNA, Anesthesiologist and Surgeon  Anesthesia Plan Comments:        Anesthesia Quick Evaluation

## 2017-02-21 ENCOUNTER — Inpatient Hospital Stay (HOSPITAL_COMMUNITY)
Admission: AD | Admit: 2017-02-21 | Discharge: 2017-02-24 | DRG: 025 | Disposition: A | Discharge status: Home / Self Care | Payer: BLUE CROSS/BLUE SHIELD | Source: Ambulatory Visit | Attending: Internal Medicine | Admitting: Internal Medicine

## 2017-02-21 ENCOUNTER — Inpatient Hospital Stay (HOSPITAL_COMMUNITY): Payer: BLUE CROSS/BLUE SHIELD

## 2017-02-21 ENCOUNTER — Other Ambulatory Visit: Payer: Self-pay

## 2017-02-21 ENCOUNTER — Ambulatory Visit (HOSPITAL_COMMUNITY)
Admission: RE | Admit: 2017-02-21 | Discharge: 2017-02-21 | Disposition: A | Discharge status: Home / Self Care | Payer: BLUE CROSS/BLUE SHIELD | Source: Ambulatory Visit | Attending: Interventional Radiology | Admitting: Interventional Radiology

## 2017-02-21 ENCOUNTER — Encounter (HOSPITAL_COMMUNITY): Payer: Self-pay

## 2017-02-21 ENCOUNTER — Ambulatory Visit (HOSPITAL_COMMUNITY): Payer: BLUE CROSS/BLUE SHIELD | Admitting: Vascular Surgery

## 2017-02-21 ENCOUNTER — Encounter (HOSPITAL_COMMUNITY)
Admission: AD | Disposition: A | Discharge status: Home / Self Care | Payer: Self-pay | Source: Ambulatory Visit | Attending: Interventional Radiology

## 2017-02-21 ENCOUNTER — Ambulatory Visit (HOSPITAL_COMMUNITY): Payer: BLUE CROSS/BLUE SHIELD | Admitting: Anesthesiology

## 2017-02-21 DIAGNOSIS — E669 Obesity, unspecified: Secondary | ICD-10-CM | POA: Diagnosis present

## 2017-02-21 DIAGNOSIS — Z90710 Acquired absence of both cervix and uterus: Secondary | ICD-10-CM

## 2017-02-21 DIAGNOSIS — Z87891 Personal history of nicotine dependence: Secondary | ICD-10-CM

## 2017-02-21 DIAGNOSIS — I110 Hypertensive heart disease with heart failure: Secondary | ICD-10-CM | POA: Diagnosis not present

## 2017-02-21 DIAGNOSIS — G40409 Other generalized epilepsy and epileptic syndromes, not intractable, without status epilepticus: Secondary | ICD-10-CM | POA: Diagnosis not present

## 2017-02-21 DIAGNOSIS — Z4682 Encounter for fitting and adjustment of non-vascular catheter: Secondary | ICD-10-CM | POA: Diagnosis not present

## 2017-02-21 DIAGNOSIS — G4733 Obstructive sleep apnea (adult) (pediatric): Secondary | ICD-10-CM | POA: Diagnosis not present

## 2017-02-21 DIAGNOSIS — Z888 Allergy status to other drugs, medicaments and biological substances status: Secondary | ICD-10-CM

## 2017-02-21 DIAGNOSIS — J9602 Acute respiratory failure with hypercapnia: Secondary | ICD-10-CM | POA: Diagnosis not present

## 2017-02-21 DIAGNOSIS — Z6830 Body mass index (BMI) 30.0-30.9, adult: Secondary | ICD-10-CM | POA: Diagnosis not present

## 2017-02-21 DIAGNOSIS — I739 Peripheral vascular disease, unspecified: Secondary | ICD-10-CM | POA: Diagnosis not present

## 2017-02-21 DIAGNOSIS — I5032 Chronic diastolic (congestive) heart failure: Secondary | ICD-10-CM | POA: Diagnosis present

## 2017-02-21 DIAGNOSIS — Z8673 Personal history of transient ischemic attack (TIA), and cerebral infarction without residual deficits: Secondary | ICD-10-CM | POA: Diagnosis not present

## 2017-02-21 DIAGNOSIS — Z23 Encounter for immunization: Secondary | ICD-10-CM | POA: Diagnosis not present

## 2017-02-21 DIAGNOSIS — R569 Unspecified convulsions: Secondary | ICD-10-CM | POA: Diagnosis not present

## 2017-02-21 DIAGNOSIS — I671 Cerebral aneurysm, nonruptured: Principal | ICD-10-CM | POA: Diagnosis present

## 2017-02-21 DIAGNOSIS — R918 Other nonspecific abnormal finding of lung field: Secondary | ICD-10-CM | POA: Diagnosis not present

## 2017-02-21 DIAGNOSIS — Z9851 Tubal ligation status: Secondary | ICD-10-CM | POA: Diagnosis not present

## 2017-02-21 DIAGNOSIS — J9601 Acute respiratory failure with hypoxia: Secondary | ICD-10-CM | POA: Diagnosis present

## 2017-02-21 DIAGNOSIS — I63312 Cerebral infarction due to thrombosis of left middle cerebral artery: Secondary | ICD-10-CM | POA: Diagnosis present

## 2017-02-21 DIAGNOSIS — I6389 Other cerebral infarction: Secondary | ICD-10-CM | POA: Diagnosis not present

## 2017-02-21 DIAGNOSIS — Z886 Allergy status to analgesic agent status: Secondary | ICD-10-CM

## 2017-02-21 DIAGNOSIS — R011 Cardiac murmur, unspecified: Secondary | ICD-10-CM | POA: Diagnosis not present

## 2017-02-21 DIAGNOSIS — R402434 Glasgow coma scale score 3-8, 24 hours or more after hospital admission: Secondary | ICD-10-CM | POA: Diagnosis not present

## 2017-02-21 DIAGNOSIS — I340 Nonrheumatic mitral (valve) insufficiency: Secondary | ICD-10-CM | POA: Diagnosis present

## 2017-02-21 DIAGNOSIS — E785 Hyperlipidemia, unspecified: Secondary | ICD-10-CM | POA: Diagnosis present

## 2017-02-21 DIAGNOSIS — Z978 Presence of other specified devices: Secondary | ICD-10-CM | POA: Diagnosis not present

## 2017-02-21 DIAGNOSIS — K219 Gastro-esophageal reflux disease without esophagitis: Secondary | ICD-10-CM | POA: Diagnosis present

## 2017-02-21 HISTORY — PX: IR TRANSCATH/EMBOLIZ: IMG695

## 2017-02-21 HISTORY — PX: IR NEURO EACH ADD'L AFTER BASIC UNI LEFT (MS): IMG5373

## 2017-02-21 HISTORY — PX: IR ANGIOGRAM FOLLOW UP STUDY: IMG697

## 2017-02-21 HISTORY — PX: RADIOLOGY WITH ANESTHESIA: SHX6223

## 2017-02-21 HISTORY — PX: IR ANGIO INTRA EXTRACRAN SEL INTERNAL CAROTID UNI L MOD SED: IMG5361

## 2017-02-21 LAB — COMPREHENSIVE METABOLIC PANEL
ALK PHOS: 45 U/L (ref 38–126)
ALK PHOS: 40 U/L (ref 38–126)
ALT: 8 U/L — ABNORMAL LOW (ref 14–54)
ALT: 8 U/L — AB (ref 14–54)
ANION GAP: 9 (ref 5–15)
AST: 17 U/L (ref 15–41)
AST: 13 U/L — ABNORMAL LOW (ref 15–41)
Albumin: 3.7 g/dL (ref 3.5–5.0)
Albumin: 3.2 g/dL — ABNORMAL LOW (ref 3.5–5.0)
Anion gap: 9 (ref 5–15)
BILIRUBIN TOTAL: 0.5 mg/dL (ref 0.3–1.2)
BILIRUBIN TOTAL: 0.4 mg/dL (ref 0.3–1.2)
BUN: 12 mg/dL (ref 6–20)
BUN: 8 mg/dL (ref 6–20)
CALCIUM: 8.8 mg/dL — AB (ref 8.9–10.3)
CO2: 22 mmol/L (ref 22–32)
CO2: 20 mmol/L — AB (ref 22–32)
CREATININE: 0.97 mg/dL (ref 0.44–1.00)
Calcium: 9.3 mg/dL (ref 8.9–10.3)
Chloride: 106 mmol/L (ref 101–111)
Chloride: 109 mmol/L (ref 101–111)
Creatinine, Ser: 0.99 mg/dL (ref 0.44–1.00)
GFR calc non Af Amer: >60 mL/min (ref >60)
Glucose, Bld: 96 mg/dL (ref 65–99)
Glucose, Bld: 130 mg/dL — ABNORMAL HIGH (ref 65–99)
Potassium: 3.7 mmol/L (ref 3.5–5.1)
Potassium: 4 mmol/L (ref 3.5–5.1)
SODIUM: 138 mmol/L (ref 135–145)
Sodium: 137 mmol/L (ref 135–145)
TOTAL PROTEIN: 7.2 g/dL (ref 6.5–8.1)
Total Protein: 6.4 g/dL — ABNORMAL LOW (ref 6.5–8.1)

## 2017-02-21 LAB — GLUCOSE, CAPILLARY: Glucose-Capillary: 100 mg/dL — ABNORMAL HIGH (ref 65–99)

## 2017-02-21 LAB — POCT I-STAT 3, ART BLOOD GAS (G3+)
Acid-base deficit: 15 mmol/L — ABNORMAL HIGH (ref 0.0–2.0)
Acid-base deficit: 5 mmol/L — ABNORMAL HIGH (ref 0.0–2.0)
Bicarbonate: 16.6 mmol/L — ABNORMAL LOW (ref 20.0–28.0)
Bicarbonate: 21.6 mmol/L (ref 20.0–28.0)
O2 SAT: 100 %
O2 SAT: 100 %
PCO2 ART: 66.9 mmHg — AB (ref 32.0–48.0)
PCO2 ART: 43.8 mmHg (ref 32.0–48.0)
PO2 ART: 402 mmHg — AB (ref 83.0–108.0)
PO2 ART: 296 mmHg — AB (ref 83.0–108.0)
Patient temperature: 98.6
Patient temperature: 98.6
TCO2: 19 mmol/L — AB (ref 22–32)
TCO2: 23 mmol/L (ref 22–32)
pH, Arterial: 7.002 — CL (ref 7.350–7.450)
pH, Arterial: 7.3 — ABNORMAL LOW (ref 7.350–7.450)

## 2017-02-21 LAB — CBC
HEMATOCRIT: 29.6 % — AB (ref 36.0–46.0)
HEMOGLOBIN: 9.8 g/dL — AB (ref 12.0–15.0)
MCH: 27.9 pg (ref 26.0–34.0)
MCHC: 33.1 g/dL (ref 30.0–36.0)
MCV: 84.3 fL (ref 78.0–100.0)
Platelets: 253 10*3/uL (ref 150–400)
RBC: 3.51 MIL/uL — ABNORMAL LOW (ref 3.87–5.11)
RDW: 15.5 % (ref 11.5–15.5)
WBC: 7.5 10*3/uL (ref 4.0–10.5)

## 2017-02-21 LAB — PROTIME-INR
INR: 0.89
PROTHROMBIN TIME: 12 s (ref 11.4–15.2)

## 2017-02-21 LAB — TRIGLYCERIDES: Triglycerides: 110 mg/dL (ref <150)

## 2017-02-21 LAB — PLATELET INHIBITION P2Y12: Platelet Function  P2Y12: 14 [PRU] — ABNORMAL LOW (ref 194–418)

## 2017-02-21 LAB — POCT ACTIVATED CLOTTING TIME
Activated Clotting Time: 180 seconds
Activated Clotting Time: 191 seconds
Activated Clotting Time: 208 seconds

## 2017-02-21 LAB — MRSA PCR SCREENING: MRSA BY PCR: NEGATIVE

## 2017-02-21 SURGERY — IR WITH ANESTHESIA
Anesthesia: General

## 2017-02-21 MED ORDER — NICARDIPINE HCL IN NACL 20-0.86 MG/200ML-% IV SOLN
0.0000 mg/h | INTRAVENOUS | Status: DC
  Filled 2017-02-21: qty 200

## 2017-02-21 MED ORDER — ACETAMINOPHEN 650 MG RE SUPP: 650.0000 mg | RECTAL | Status: DC | PRN

## 2017-02-21 MED ORDER — ASPIRIN EC 325 MG PO TBEC: 325.0000 mg | DELAYED_RELEASE_TABLET | ORAL | Status: DC

## 2017-02-21 MED ORDER — HEPARIN (PORCINE) IN NACL 100-0.45 UNIT/ML-% IJ SOLN
500.0000 [IU]/h | INTRAMUSCULAR | Status: DC
  Administered 2017-02-21: 500 [IU]/h via INTRAVENOUS
  Filled 2017-02-21: qty 250

## 2017-02-21 MED ORDER — METOCLOPRAMIDE HCL 5 MG/ML IJ SOLN: 10.0000 mg | Freq: Once | INTRAMUSCULAR | Status: DC | PRN

## 2017-02-21 MED ORDER — EPTIFIBATIDE 20 MG/10ML IV SOLN
INTRAVENOUS | Status: AC | PRN
  Administered 2017-02-21: 1.5 mg via INTRAVENOUS

## 2017-02-21 MED ORDER — PHENYLEPHRINE 40 MCG/ML (10ML) SYRINGE FOR IV PUSH (FOR BLOOD PRESSURE SUPPORT)
PREFILLED_SYRINGE | INTRAVENOUS | Status: DC | PRN
  Administered 2017-02-21 (×4): 80 ug via INTRAVENOUS

## 2017-02-21 MED ORDER — CEFAZOLIN SODIUM-DEXTROSE 2-4 GM/100ML-% IV SOLN
2.0000 g | INTRAVENOUS | Status: AC
  Administered 2017-02-21: 2 g via INTRAVENOUS
  Filled 2017-02-21: qty 100

## 2017-02-21 MED ORDER — PHENYLEPHRINE HCL 10 MG/ML IJ SOLN
INTRAVENOUS | Status: DC | PRN
  Administered 2017-02-21: 20 ug/min via INTRAVENOUS

## 2017-02-21 MED ORDER — PROPOFOL 10 MG/ML IV BOLUS
INTRAVENOUS | Status: DC | PRN
  Administered 2017-02-21: 120 mg via INTRAVENOUS
  Administered 2017-02-21: 20 mg via INTRAVENOUS

## 2017-02-21 MED ORDER — SUGAMMADEX SODIUM 200 MG/2ML IV SOLN
INTRAVENOUS | Status: DC | PRN
  Administered 2017-02-21: 196 mg via INTRAVENOUS

## 2017-02-21 MED ORDER — HEPARIN (PORCINE) IN NACL 100-0.45 UNIT/ML-% IJ SOLN
INTRAMUSCULAR | Status: AC
  Filled 2017-02-21: qty 250

## 2017-02-21 MED ORDER — FENTANYL CITRATE (PF) 100 MCG/2ML IJ SOLN
INTRAMUSCULAR | Status: AC
  Administered 2017-02-21: 100 ug
  Filled 2017-02-21: qty 4

## 2017-02-21 MED ORDER — DEXAMETHASONE SODIUM PHOSPHATE 10 MG/ML IJ SOLN
INTRAMUSCULAR | Status: DC | PRN
  Administered 2017-02-21: 4 mg via INTRAVENOUS

## 2017-02-21 MED ORDER — SODIUM CHLORIDE 0.9 % IV SOLN
1500.0000 mg | INTRAVENOUS | Status: AC
  Administered 2017-02-21: 1500 mg via INTRAVENOUS
  Filled 2017-02-21: qty 15

## 2017-02-21 MED ORDER — EPHEDRINE SULFATE-NACL 50-0.9 MG/10ML-% IV SOSY
PREFILLED_SYRINGE | INTRAVENOUS | Status: DC | PRN
  Administered 2017-02-21: 5 mg via INTRAVENOUS

## 2017-02-21 MED ORDER — NITROGLYCERIN 1 MG/10 ML FOR IR/CATH LAB
INTRA_ARTERIAL | Status: AC
  Filled 2017-02-21: qty 10

## 2017-02-21 MED ORDER — ETOMIDATE 2 MG/ML IV SOLN
30.0000 mg | Freq: Once | INTRAVENOUS | Status: AC
  Administered 2017-02-21: 30 mg via INTRAVENOUS

## 2017-02-21 MED ORDER — NICARDIPINE HCL IN NACL 20-0.86 MG/200ML-% IV SOLN
INTRAVENOUS | Status: DC | PRN
  Administered 2017-02-21: 1 mg/h via INTRAVENOUS

## 2017-02-21 MED ORDER — MIDAZOLAM HCL 2 MG/2ML IJ SOLN
INTRAMUSCULAR | Status: AC
  Administered 2017-02-21: 4 mg
  Filled 2017-02-21: qty 6

## 2017-02-21 MED ORDER — ONDANSETRON HCL 4 MG/2ML IJ SOLN
INTRAMUSCULAR | Status: DC | PRN
  Administered 2017-02-21: 4 mg via INTRAVENOUS

## 2017-02-21 MED ORDER — IOPAMIDOL (ISOVUE-300) INJECTION 61%
INTRAVENOUS | Status: AC
  Administered 2017-02-21: 70 mL
  Filled 2017-02-21: qty 150

## 2017-02-21 MED ORDER — PROPOFOL 1000 MG/100ML IV EMUL
INTRAVENOUS | Status: AC
  Filled 2017-02-21: qty 100

## 2017-02-21 MED ORDER — ACETAMINOPHEN 325 MG PO TABS
650.0000 mg | ORAL_TABLET | ORAL | Status: DC | PRN
  Administered 2017-02-24: 650 mg via ORAL
  Filled 2017-02-21 (×2): qty 2

## 2017-02-21 MED ORDER — LORAZEPAM 2 MG/ML IJ SOLN
INTRAMUSCULAR | Status: AC
  Administered 2017-02-21: 2 mg
  Filled 2017-02-21: qty 1

## 2017-02-21 MED ORDER — SODIUM CHLORIDE 0.9 % IV SOLN
1000.0000 mg | Freq: Two times a day (BID) | INTRAVENOUS | Status: DC
  Administered 2017-02-22 – 2017-02-23 (×3): 1000 mg via INTRAVENOUS
  Filled 2017-02-21 (×4): qty 10

## 2017-02-21 MED ORDER — ROCURONIUM BROMIDE 10 MG/ML (PF) SYRINGE
PREFILLED_SYRINGE | INTRAVENOUS | Status: DC | PRN
  Administered 2017-02-21: 20 mg via INTRAVENOUS
  Administered 2017-02-21: 50 mg via INTRAVENOUS

## 2017-02-21 MED ORDER — ACETAMINOPHEN 160 MG/5ML PO SOLN: 650.0000 mg | ORAL | Status: DC | PRN

## 2017-02-21 MED ORDER — LIDOCAINE 2% (20 MG/ML) 5 ML SYRINGE
INTRAMUSCULAR | Status: DC | PRN
  Administered 2017-02-21: 60 mg via INTRAVENOUS

## 2017-02-21 MED ORDER — HEPARIN SODIUM (PORCINE) 1000 UNIT/ML IJ SOLN
INTRAMUSCULAR | Status: DC | PRN
  Administered 2017-02-21 (×2): 500 [IU] via INTRAVENOUS
  Administered 2017-02-21: 3000 [IU] via INTRAVENOUS

## 2017-02-21 MED ORDER — PROPOFOL 1000 MG/100ML IV EMUL
5.0000 ug/kg/min | INTRAVENOUS | Status: DC
  Administered 2017-02-21 – 2017-02-22 (×4): 30 ug/kg/min via INTRAVENOUS
  Filled 2017-02-21 (×3): qty 100

## 2017-02-21 MED ORDER — HEPARIN (PORCINE) IN NACL 100-0.45 UNIT/ML-% IJ SOLN
300.0000 [IU]/h | INTRAMUSCULAR | Status: AC
  Administered 2017-02-21: 300 [IU]/h via INTRAVENOUS

## 2017-02-21 MED ORDER — ORAL CARE MOUTH RINSE
15.0000 mL | OROMUCOSAL | Status: DC
  Administered 2017-02-21 – 2017-02-22 (×5): 15 mL via OROMUCOSAL

## 2017-02-21 MED ORDER — FENTANYL CITRATE (PF) 100 MCG/2ML IJ SOLN
INTRAMUSCULAR | Status: DC | PRN
  Administered 2017-02-21 (×2): 50 ug via INTRAVENOUS

## 2017-02-21 MED ORDER — ASPIRIN 325 MG PO TABS
162.5000 mg | ORAL_TABLET | Freq: Every day | ORAL | Status: DC
  Administered 2017-02-22 – 2017-02-23 (×2): 162.5 mg via ORAL
  Filled 2017-02-21 (×2): qty 1

## 2017-02-21 MED ORDER — IOPAMIDOL (ISOVUE-300) INJECTION 61%
INTRAVENOUS | Status: AC
  Filled 2017-02-21: qty 300

## 2017-02-21 MED ORDER — CLOPIDOGREL BISULFATE 75 MG PO TABS: 75.0000 mg | ORAL_TABLET | Freq: Every day | ORAL | Status: DC

## 2017-02-21 MED ORDER — TICAGRELOR 90 MG PO TABS
90.0000 mg | ORAL_TABLET | Freq: Every day | ORAL | Status: DC
  Administered 2017-02-22: 90 mg via ORAL
  Filled 2017-02-21 (×3): qty 1

## 2017-02-21 MED ORDER — SODIUM CHLORIDE 0.9 % IV SOLN
INTRAVENOUS | Status: DC
  Administered 2017-02-21 – 2017-02-22 (×2): via INTRAVENOUS

## 2017-02-21 MED ORDER — NITROGLYCERIN 0.2 MG/ML ON CALL CATH LAB
INTRAVENOUS | Status: DC | PRN
  Administered 2017-02-21: 80 ug via INTRAVENOUS
  Administered 2017-02-21 (×2): 40 ug via INTRAVENOUS
  Administered 2017-02-21: 80 ug via INTRAVENOUS
  Administered 2017-02-21 (×2): 40 ug via INTRAVENOUS

## 2017-02-21 MED ORDER — SODIUM CHLORIDE 0.9 % IV SOLN: INTRAVENOUS | Status: DC

## 2017-02-21 MED ORDER — CLEVIDIPINE BUTYRATE 0.5 MG/ML IV EMUL
0.0000 mg/h | INTRAVENOUS | Status: DC
  Administered 2017-02-21: 1 mg/h via INTRAVENOUS
  Administered 2017-02-21: 17 mg/h via INTRAVENOUS
  Administered 2017-02-21: 16 mg/h via INTRAVENOUS
  Administered 2017-02-21: 1 mg/h via INTRAVENOUS
  Administered 2017-02-22: 14 mg/h via INTRAVENOUS
  Administered 2017-02-22 (×2): 21 mg/h via INTRAVENOUS
  Filled 2017-02-21 (×10): qty 50

## 2017-02-21 MED ORDER — NIMODIPINE 30 MG PO CAPS
0.0000 mg | ORAL_CAPSULE | ORAL | Status: AC
  Administered 2017-02-21: 60 mg via ORAL
  Filled 2017-02-21: qty 2

## 2017-02-21 MED ORDER — EPTIFIBATIDE 20 MG/10ML IV SOLN
INTRAVENOUS | Status: AC
  Filled 2017-02-21: qty 10

## 2017-02-21 MED ORDER — FENTANYL CITRATE (PF) 100 MCG/2ML IJ SOLN: 25.0000 ug | INTRAMUSCULAR | Status: DC | PRN

## 2017-02-21 MED ORDER — LACTATED RINGERS IV SOLN
INTRAVENOUS | Status: DC
  Administered 2017-02-21 (×2): via INTRAVENOUS

## 2017-02-21 MED ORDER — PANTOPRAZOLE SODIUM 40 MG IV SOLR
40.0000 mg | INTRAVENOUS | Status: DC
  Administered 2017-02-22 (×2): 40 mg via INTRAVENOUS
  Filled 2017-02-21 (×2): qty 40

## 2017-02-21 MED ORDER — LACTATED RINGERS IV SOLN
INTRAVENOUS | Status: DC | PRN
  Administered 2017-02-21: 09:00:00 via INTRAVENOUS

## 2017-02-21 MED ORDER — HEPARIN (PORCINE) IN NACL 100-0.45 UNIT/ML-% IJ SOLN: 300.0000 [IU]/h | INTRAMUSCULAR | Status: DC

## 2017-02-21 MED ORDER — NICARDIPINE HCL IN NACL 40-0.83 MG/200ML-% IV SOLN
3.0000 mg/h | INTRAVENOUS | Status: DC
  Filled 2017-02-21 (×2): qty 200

## 2017-02-21 MED ORDER — CHLORHEXIDINE GLUCONATE 0.12% ORAL RINSE (MEDLINE KIT)
15.0000 mL | Freq: Two times a day (BID) | OROMUCOSAL | Status: DC
  Administered 2017-02-21 – 2017-02-22 (×2): 15 mL via OROMUCOSAL

## 2017-02-21 NOTE — Progress Notes (Signed)
To bedside this afternoon for assessment after pipeline stent intervention this AM.  Patient had been recovering well after intervention this AM.  BP rhad been elevated and was being transitioned from cardene to cleviprex with improvement. Patient initially answering questions, alert, and oriented.  RN addressing groin site which had a small amount of bruising.  Patient placed in trendelenburg for repositioning, however became altered, rolled to the right and began with seizure-like activity. After seizure, patient was post-ictal with poor airway protection. Patient withdrawing to pain. Not answering questions. Her O2 saturations declined to the 20s. Patient was supported with suctioning and bag mask, ultimately placed on non-rebreather. She was given 1mg  ativan.  CCM and Neurology to bedside for evaluation.   During seizure, patient developed oozing from her groin site.  This PA held pressure for approximately 10 minutes achieving stasis.  Pressure dressing applied.   Family in room during events states no history seizures.  She also called the patient's son who was not aware of any history of seizure.   CT Head has been ordered.   Heparin stopped.  Will follow results.  Plavix discontinued. Brilinta ordered for tomorrow.   IR to follow.  Brynda Greathouse, MS RD PA-C

## 2017-02-21 NOTE — Procedures (Signed)
S/p Lt common carotid arteriogram. S/P placement  of a pipeline flow diverter across x2 Lt ICA   para ophthalmic aneurysms.

## 2017-02-21 NOTE — Progress Notes (Addendum)
Patient ID: Yolanda Espinoza, female   DOB: 31-Dec-1955, 62 y.o.   MRN: [id number] INR  CT brain  No SAH/ICH/mass effect  Or acute  ischemic  Changes seen. S>Devante Capano MD  Patient presently intubated and sedated. Opens eyes to name .  VS  BP 120s /70s HR 80s SR. PAO@> 95%. RT groin soft. Plan . 1.D/C IV heparin at 800pm. 2.BP 035C to 140systolic. 3.Vent  And med issues as per CCM S.Uri Covey MD

## 2017-02-21 NOTE — Progress Notes (Signed)
Heparin gtt turned off at 2000 per MD.

## 2017-02-21 NOTE — Anesthesia Postprocedure Evaluation (Signed)
Anesthesia Post Note  Patient: Yolanda Espinoza  Procedure(s) Performed: EMBOLIZATION (N/A )     Patient location during evaluation: PACU Anesthesia Type: General Level of consciousness: awake and alert and oriented Pain management: pain level controlled Vital Signs Assessment: post-procedure vital signs reviewed and stable Respiratory status: spontaneous breathing, nonlabored ventilation, respiratory function stable and patient connected to nasal cannula oxygen Cardiovascular status: blood pressure returned to baseline and stable Postop Assessment: no apparent nausea or vomiting Anesthetic complications: no    Last Vitals:  Vitals:   02/21/17 1150 02/21/17 1155  BP:  (!) 144/79  Pulse: 76 75  Resp: 11 10  Temp: (!) 36.3 C   SpO2: 100% 100%    Last Pain:  Vitals:   02/21/17 0700  TempSrc: Oral                 Carilyn Woolston A.

## 2017-02-21 NOTE — Progress Notes (Signed)
Heparin going at 300 units per hour.  Turn off at 2000 per MD.

## 2017-02-21 NOTE — Anesthesia Procedure Notes (Signed)
Procedure Name: Intubation Date/Time: 02/21/2017 9:16 AM Performed by: Renato Shin, CRNA Pre-anesthesia Checklist: Patient identified, Emergency Drugs available, Suction available and Patient being monitored Patient Re-evaluated:Patient Re-evaluated prior to induction Oxygen Delivery Method: Circle system utilized Preoxygenation: Pre-oxygenation with 100% oxygen Induction Type: IV induction Ventilation: Mask ventilation without difficulty Laryngoscope Size: Miller and 3 Grade View: Grade II Tube type: Oral Tube size: 7.0 mm Number of attempts: 1 Placement Confirmation: ETT inserted through vocal cords under direct vision,  positive ETCO2,  CO2 detector and breath sounds checked- equal and bilateral Secured at: 20 cm Tube secured with: Tape Dental Injury: Teeth and Oropharynx as per pre-operative assessment

## 2017-02-21 NOTE — Consult Note (Signed)
Neurology Consultation Reason for [place] Referring Physician: Dr Patrecia Pour   History is obtained from: chart review and provider  HPI: Yolanda Espinoza is a 62 y.o. female is  history of a left ICA aneurysmm, HTN, heart murmur, CVA, PVD, prior ruptured aneurysm in 2003, HLD, CHF who underwent pipeline stent embolization today. Patient doing well, answering questions and oriented. Around 3pm she had a generalized tonic clonic seizure lasting 78min, After seizure, patient was post-ictal with poor airway protection. Her PCO2 was elevated and she was intubated for airway protection.   I arrived just prior to intubation, not following commands but withdrawing equally to pain in all 4 extremities. Following intubation, a stat CT head was obtained and showed no hemorrhage.  She was loaded with Keppra.    ROS: Unable to obtain due to altered mental status.   Past Medical History:  Diagnosis Date  . Anemia   . Arthritis   . Chronic diastolic heart failure ([place])   . GERD (gastroesophageal reflux disease)   . Heart murmur   . Hyperlipidemia   . Hypertension may, 1973  . Mitral regurgitation    a. Echo 01/2012 mild LVH, EF 55-65%, Gr 2 DD, mod MR, mild LAE, PASP 36;  b. Echo (02/2013):  EF 55-60%, Gr 1 DD, mild to mod MR, mild LAE (no sig change since prior echo) // c. Echo 10/18: EF 55-60, normal wall motion, grade 2 diastolic dysfunction, trivial AI, mild to moderate MR, mild LAE, trivial PI, PASP 38    . OSA on CPAP    7 yrs  . Peripheral vascular disease ([place])    cerebral aneursym  . Stroke ([place]) 07   no weakness  . Subarachnoid hemorrhage due to ruptured aneurysm ([place])    2003 - s/p coiling  . Unspecified vitamin D deficiency      Family History  Problem Relation Age of Onset  . Hypertension Father   . Kidney disease Father   . Hypertension Sister   . Hypertension Sister   . Hypertension Sister    Social History:  reports that she quit smoking about 18 years ago. Her smoking  use included cigarettes. She has a 0.75 pack-year smoking history. she has never used smokeless tobacco. She reports that she does not drink alcohol or use drugs.   Exam: Current vital signs: BP 126/74 (BP Location: Right Arm)   Pulse 82   Temp 98.5 F (36.9 C) (Axillary)   Resp (!) 24   Ht 5\' 11"  (1.803 m)   Wt 98 kg (216 lb)   SpO2 100%   BMI 30.13 kg/m  Vital signs in last 24 hours: Temp:  [97.3 F (36.3 C)-98.5 F (36.9 C)] 98.5 F (36.9 C) (01/30 1940) Pulse Rate:  [69-106] 82 (01/30 2000) Resp:  [10-24] 24 (01/30 2000) BP: (110-173)/(67-126) 126/74 (01/30 2000) SpO2:  [94 %-100 %] 100 % (01/30 2000) Arterial Line BP: (105-178)/(46-89) 140/58 (01/30 2000) FiO2 (%):  [50 %-100 %] 50 % (01/30 2000) Weight:  [98 kg (216 lb)] 98 kg (216 lb) (01/30 0710)   Physical Exam  Constitutional: Appears well-developed and well-nourished. Obese.  Psych: Affect appropriate to situation Eyes: No scleral injection HENT: No OP obstrucion Head: Normocephalic.  Cardiovascular: Normal rate and regular rhythm.  Respiratory: Effort normal, non-labored breathing GI: Soft.  No distension. There is no tenderness.  Skin: WDI  Neuro: prior to intubation Mental Status: patient opens eyes to painful stimuli, not following commands Cranial Nerves: II: Visual Fields : unable  to assess. Pupils are equal, round, and reactive to light.   III,IV, VI: no obvious nystagmus  V: Facial sensation is symmetric to temperature VII: Facial movement is symmetric.  VIII: hearing is intact to voice X: Uvula elevates symmetrically XI: Shoulder shrug is symmetric. XII: tongue is midline without atrophy or fasciculations.  Motor: Withdraws with good strength  in all 4 extremities, normal tone Sensory: Withdraws to pain bilaterally Deep Tendon Reflexes: 2+ and symmetric in the biceps and patellae.  Plantars: Toes are downgoing bilaterally.  Cerebellar: Did not assess    I have reviewed labs in epic  and the results pertinent to this consultation are: All labs still pending   I have reviewed the images obtained: CT head: no acute hemorrhage, old left hemispheric infarcts seen  ASSESSMENT AND PLAN  Seizure CT head negative for any acute pathology  Load with 1.5 Keppra and 1 g BID  MRI brain ordered stat  Will obtain Routine EEG  F/U electrolytes, check for infection Seizure precuations  Notify Neurology on call if patient has another seizure    Yolanda Ouellet MD [facility] [phone]  If 7pm to 7am, please call on call as listed on AMION.

## 2017-02-21 NOTE — Procedures (Signed)
Name: [name] MRN: [id number] DOB: [birth year]   PROCEDURE NOTE  Procedure:  Endotracheal intubation.  Indication:  Acute respiratory failure  Consent:  Consent was implied due to the emergency nature of the procedure.  Anesthesia:  A total of 30 mg of Etomidate was given intravenously.  100 mics of fentanyl 4 mg of Versed  Procedure summary:  Appropriate equipment was assembled. The patient was identified as Yolanda Espinoza and safety timeout was performed. The patient was placed supine, with head in sniffing position. After adequate level of anesthesia was achieved, a 4 blade was inserted into the oropharynx and the vocal cords were visualized. A 7.5 endotracheal tube was inserted without difficulty and visualized going through the vocal cords. The stylette was removed and cuff inflated. Colorimetric change was noted on the CO2 meter. Breath sounds were heard over both lung fields equally. ETT was secured at 24 @ the lip line.  Post procedure chest xray was ordered.  Complications:  No immediate complications were noted.  Hemodynamic parameters and oxygenation remained stable throughout the procedure.     Pulmonary and [place] Pager: [phone]  02/21/2017, 3:25 PM

## 2017-02-21 NOTE — Consult Note (Addendum)
Name: Yolanda Espinoza MRN: [id number] DOB: 1955-08-15    ADMISSION DATE:  02/21/2017 CONSULTATION DATE: February 21, 2017 REFERRING MD : Neurology service  CHIEF COMPLAINT:  *Seizure altered mental status post ictal  BRIEF PATIENT DESCRIPTION: I was called stat to secure the airway for this patient  SIGNIFICANT EVENTS  ABG with PCO2 of 66 pH of 7  STUDIES:  ABG with pH of 7   HISTORY OF PRESENT ILLNESS: This is a 62 year old African-American female with past medical history of ICA aneurysm on the left side she had history of hypertension heart murmur CVA peripheral vascular disease above found to have left ICA aneurysm she came in for some pipeline stent after the stent she was extubated sent to the ICU where she had severe altered mental status seizure postictal and ABG was done and the patient had pH of 7 PCO2 66 unable to protect airways though the decision was made to intubate her stat and sent her for CAT scan.  Patient was not able to give any history history is included in previous medical records family was by the bedside daughter consent was obtained from her.  PAST MEDICAL HISTORY :   has a past medical history of Anemia, Arthritis, Chronic diastolic heart failure ([place]), GERD (gastroesophageal reflux disease), Heart murmur, Hyperlipidemia, Hypertension (may, 1973), Mitral regurgitation, OSA on CPAP, Peripheral vascular disease ([place]), Stroke ([place]) (07), Subarachnoid hemorrhage due to ruptured aneurysm ([place]), and Unspecified vitamin D deficiency.  has a past surgical history that includes Aneurysm coiling; head surgery; Abdominal hysterectomy; Tonsillectomy and adenoidectomy; Radiology with anesthesia (N/A, 04/23/2013); IR 3D Independent Wkst (09/04/2016); IR Angiogram Extremity Left (09/04/2016); IR ANGIO INTRA EXTRACRAN SEL COM CAROTID INNOMINATE BILAT MOD SED (09/04/2016); IR ANGIO VERTEBRAL SEL VERTEBRAL BILAT MOD SED (09/04/2016); IR Radiologist Eval & Mgmt (10/11/2016); Radiology with  anesthesia (N/A, 10/30/2016); and Tubal ligation. Prior to Admission medications   Medication Sig Start Date End Date Taking? Authorizing Provider  amLODipine (NORVASC) 10 MG tablet Take 10 mg by mouth daily.   Yes [provider]  aspirin EC 81 MG tablet Take 1 tablet (81 mg total) by mouth daily. 03/11/15  Yes Nahser, Wonda Cheng, MD  Calcium Carb-Cholecalciferol (CALCIUM 600 + D PO) Take 1 tablet by mouth daily.   Yes [provider]  cyclobenzaprine (FLEXERIL) 10 MG tablet Take 1 tablet (10 mg total) by mouth at bedtime as needed for muscle spasms. 01/05/17  Yes Vicie Mutters, PA-C  ferrous sulfate 325 (65 FE) MG tablet Take 325 mg by mouth daily with breakfast.   Yes [provider]  furosemide (LASIX) 40 MG tablet Take 1 tablet (40 mg total) by mouth daily as needed for edema. 10/23/16 02/09/17 Yes Weaver, Scott T, PA-C  magnesium gluconate (MAGONATE) 500 MG tablet Take 500 mg by mouth daily.   Yes [provider]  Multiple Vitamins-Minerals (MULTIVITAMIN WITH MINERALS) tablet Take 1 tablet by mouth daily.   Yes [provider]  olmesartan (BENICAR) 40 MG tablet Take 1 tablet (40 mg total) by mouth daily. 10/16/16  Yes Nahser, Wonda Cheng, MD  Omega-3 Fatty Acids (FISH OIL) 1200 MG CAPS Take 2,400 mg by mouth daily.    Yes [provider]  PREMARIN 0.9 MG tablet TAKE 1 TABLET BY MOUTH ONCE DAILY.(NEED OFFICE VISIT BEFORE FURTHER REFILLS). 01/28/17  Yes Unk Pinto, MD  ticagrelor (BRILINTA) 90 MG TABS tablet Take 90 mg by mouth daily.   Yes [provider]  vitamin B-12 (CYANOCOBALAMIN) 1000 MCG tablet  Take 1,000 mcg by mouth daily.   Yes [provider]   Allergies  Allergen Reactions  . Ace Inhibitors Nausea And Vomiting  . Aspirin Nausea And Vomiting    Can take coated Aspirin    FAMILY HISTORY:  family history includes Hypertension in her father, sister, sister, and sister; Kidney disease in her father. SOCIAL  HISTORY:  reports that she quit smoking about 18 years ago. Her smoking use included cigarettes. She has a 0.75 pack-year smoking history. she has never used smokeless tobacco. She reports that she does not drink alcohol or use drugs.  REVIEW OF SYSTEMS:   Unable to obtain due to patient's conditions  SUBJECTIVE:   VITAL SIGNS: Temp:  [97.3 F (36.3 C)-97.9 F (36.6 C)] 97.7 F (36.5 C) (01/30 1248) Pulse Rate:  [72-106] 106 (01/30 1500) Resp:  [10-19] 10 (01/30 1500) BP: (125-173)/(67-109) 125/77 (01/30 1500) SpO2:  [94 %-100 %] 100 % (01/30 1500) Arterial Line BP: (105-178)/(51-81) 105/51 (01/30 1500) Weight:  [216 lb (98 kg)] 216 lb (98 kg) (01/30 0710)  PHYSICAL EXAMINATION: General: Postictal unresponsive snoring GCS 5 GCS 5 and unable to protect airways HEENT:  atraumatic , no jaundice , dry mucous membranes  Cardiovascular:  Irregular irregular , ESM 2/6 in the aortic area  Lungs:  CTA bilateral , no wheezing or crackles  Abdomen:  Soft lax +BS , no tenderness . Musculoskeletal:  WNL , normal pulses  Skin:  No rash    Recent Labs  Lab 02/21/17 0706  NA 137  K 3.7  CL 106  CO2 22  BUN 12  CREATININE 0.99  GLUCOSE 96   Recent Labs  Lab 02/21/17 0706  HGB 9.8*  HCT 29.6*  WBC 7.5  PLT 253   No results found.  ASSESSMENT / PLAN:  --New onset seizure unknown etiology CT scan is pending might need an EEG neuro is following started on propofol and Keppra. --Hypertension blood pressure control she was started on Cleviprex. --History of hypertension hyperlipidemia aneurysm status post stenting euros following she is on Brilinta and aspirin. --For right now we will keep the blood pressure between 120 and 140 not higher than that. --Plan interventional radiology will keep the patient heparin drip for the next 6 hours.  At 10 PM on the 300 units/h no adjustments no higher dose no boluses. --Patient is with hypercapnic respiratory failure unclear etiology most  likely postictal intubated for airway protection and for hypercapnic respiratory failure acute this point continue with support and repeat labs in the morning the patient stable to be central cause. --Endotracheal tube reviewed in the x-ray by myself tube has to be withdrawn 1 cm. --Altered mental status from seizure unknown etiology we will make sure there is no hypoglycemia CT scan is pending. --Patient is being anticoagulated with heparin she will be on Protonix for DVT prophylaxis she is a full code critical care time 45 minutes.  Pulmonary and [place] Pager: [phone]  02/21/2017, 3:39 PM

## 2017-02-21 NOTE — Progress Notes (Signed)
Patient ID: TUNYA HELD, female   DOB: 04-Jan-1956, 62 y.o.   MRN: [id number] INR. Post procedure extubated without difficulty. Upon recovery  No complaints of H/S,N/V or visual or motor odr speech difficulties. O/E  Alert ,appropriately responsive. .Pupils 2 to 3 mm RRRto L D and consensually. EOMS full. No facial asymmetry,. Tongue in the midline. Moves all 4s equally. BP 140s to 150s /80s /HR 70 to 80s SR PAO2 > 95 % on 2l O2 [place]. RT groin soft. Pulses palpable DPs and dopplerable PTs.  Patient transferred to NICU. Stable clinically  and neurologically. C/O mild H/A,and RT groin discomfort.  Whilst being repositioned in the bed ,patients RT side of her face was seen to draw up and twitch,with head turning to the right followed by generalized tonic clonic seizure lasting  approx 3 mins followed by post ictal state. IV heparin stopped immediately .I mg of  ativan given IV . Stat consults from CCM and neurology  r.equested. Patient gradually started responding to simple commands and spontaneusly moving all 4 s equally, though still lethargic . Stat CT brain ordered. CCM feels patient needs intubation .Neurology recommendations pending. D/W patients  daughter in law. S.Jann Milkovich MD

## 2017-02-21 NOTE — Progress Notes (Signed)
Patient transported to PACU and report given to PACU RN Faythe Dingwall.

## 2017-02-21 NOTE — Progress Notes (Signed)
Pt arrived on unit around 1300 alert and oriented.  Cardene titrated to meet blood pressure goals.  MD notified that cardene max and still not meeting BP goals.  Cleviprex ordered and started.  Around 1430, IR MD and PA along with RN witness seizure which lasted around 3 minutes.  Neurology and CCM  Consulted.  Pt was intubated and STAT CT ordered.  Pt's family updated by IR MD.  Will continue to monitor.

## 2017-02-21 NOTE — H&P (Signed)
Chief Complaint: left ICA aneurysm  Supervising Physician: Luanne Bras  Patient Status: [facility] - Out-pt  HPI: Yolanda Espinoza is a 62 y.o. female with a history of a left ICA aneurysm.  She has a history of HTN, heart murmur, CVA, PVD, who was found to have a L ICA aneurysm. She has a history of a ruptured aneurysm in 2003.  She has seen Dr. Estanislado Pandy in the past and scheduled and arranged for intervention today.  She is on Brilinta and ASA.  She has no complaints today and doing well.  Past Medical History:  Past Medical History:  Diagnosis Date  . Anemia   . Arthritis   . Chronic diastolic heart failure ([place])   . GERD (gastroesophageal reflux disease)   . Heart murmur   . Hyperlipidemia   . Hypertension may, 1973  . Mitral regurgitation    a. Echo 01/2012 mild LVH, EF 55-65%, Gr 2 DD, mod MR, mild LAE, PASP 36;  b. Echo (02/2013):  EF 55-60%, Gr 1 DD, mild to mod MR, mild LAE (no sig change since prior echo) // c. Echo 10/18: EF 55-60, normal wall motion, grade 2 diastolic dysfunction, trivial AI, mild to moderate MR, mild LAE, trivial PI, PASP 38    . OSA on CPAP    7 yrs  . Peripheral vascular disease ([place])    cerebral aneursym  . Stroke ([place]) 07   no weakness  . Subarachnoid hemorrhage due to ruptured aneurysm ([place])    2003 - s/p coiling  . Unspecified vitamin D deficiency     Past Surgical History:  Past Surgical History:  Procedure Laterality Date  . ABDOMINAL HYSTERECTOMY    . ANEURYSM COILING    . head surgery     aneurysm ruptured- stroke  . IR 3D INDEPENDENT WKST  09/04/2016  . IR ANGIO INTRA EXTRACRAN SEL COM CAROTID INNOMINATE BILAT MOD SED  09/04/2016  . IR ANGIO VERTEBRAL SEL VERTEBRAL BILAT MOD SED  09/04/2016  . IR ANGIOGRAM EXTREMITY LEFT  09/04/2016  . IR RADIOLOGIST EVAL & MGMT  10/11/2016  . RADIOLOGY WITH ANESTHESIA N/A 04/23/2013   Procedure: RADIOLOGY WITH ANESTHESIA;  Surgeon: Rob Hickman, MD;  Location: [place];  Service: Radiology;   Laterality: N/A;  . RADIOLOGY WITH ANESTHESIA N/A 10/30/2016   Procedure: EMBOLIZATION;  Surgeon: Luanne Bras, MD;  Location: [place];  Service: Radiology;  Laterality: N/A;  . TONSILLECTOMY AND ADENOIDECTOMY    . TUBAL LIGATION      Family History:  Family History  Problem Relation Age of Onset  . Hypertension Father   . Kidney disease Father   . Hypertension Sister   . Hypertension Sister   . Hypertension Sister     Social History:  reports that she quit smoking about 18 years ago. Her smoking use included cigarettes. She has a 0.75 pack-year smoking history. she has never used smokeless tobacco. She reports that she does not drink alcohol or use drugs.  Allergies:  Allergies  Allergen Reactions  . Ace Inhibitors Nausea And Vomiting  . Aspirin Nausea And Vomiting    Can take coated Aspirin    Medications: Medications reviewed in epic  Please HPI for pertinent positives, otherwise complete 10 system ROS negative.  Mallampati Score: MD Evaluation Airway: WNL Heart: Other (comments) Heart  comments: +murmur Abdomen: WNL Chest/ Lungs: WNL ASA  Classification: 3  Physical Exam: BP (!) 173/78   Pulse 74   Temp 97.9 F (36.6 C) (  Oral)   Resp 18   Ht _0  (1.803 m)   Wt 216 lb (98 kg)   BMI 30.13 kg/m  Body mass index is 30.13 kg/m. General: pleasant, WD, WN black female who is laying in bed in NAD HEENT: head is normocephalic, atraumatic.  Sclera are noninjected.  PERRL.  Ears and nose without any masses or lesions.  Mouth is pink and moist Heart: regular, rate, and rhythm.  Normal s1,s2. +murmur. No obvious gallops, or rubs noted.  Palpable radial and pedal pulses bilaterally Lungs: CTAB, no wheezes, rhonchi, or rales noted.  Respiratory effort nonlabored Abd: soft, NT, ND, +BS, no masses, hernias, or organomegaly Neuro: diffusely intact with no deficits  Psych: A&Ox3 with an appropriate affect.   Labs: Results for orders placed or performed during the  hospital encounter of 02/21/17 (from the past 48 hour(s))  CBC     Status: Abnormal   Collection Time: 02/21/17  7:06 AM  Result Value Ref Range   WBC 7.5 4.0 - 10.5 K/uL   RBC 3.51 (L) 3.87 - 5.11 MIL/uL   Hemoglobin 9.8 (L) 12.0 - 15.0 g/dL   HCT 29.6 (L) 36.0 - 46.0 %   MCV 84.3 78.0 - 100.0 fL   MCH 27.9 26.0 - 34.0 pg   MCHC 33.1 30.0 - 36.0 g/dL   RDW 15.5 11.5 - 15.5 %   Platelets 253 150 - 400 K/uL  Comprehensive metabolic panel     Status: Abnormal   Collection Time: 02/21/17  7:06 AM  Result Value Ref Range   Sodium 137 135 - 145 mmol/L   Potassium 3.7 3.5 - 5.1 mmol/L   Chloride 106 101 - 111 mmol/L   CO2 22 22 - 32 mmol/L   Glucose, Bld 96 65 - 99 mg/dL   BUN 12 6 - 20 mg/dL   Creatinine, Ser 0.99 0.44 - 1.00 mg/dL   Calcium 9.3 8.9 - 10.3 mg/dL   Total Protein 7.2 6.5 - 8.1 g/dL   Albumin 3.7 3.5 - 5.0 g/dL   AST 17 15 - 41 U/L   ALT 8 (L) 14 - 54 U/L   Alkaline Phosphatase 45 38 - 126 U/L   Total Bilirubin 0.5 0.3 - 1.2 mg/dL   GFR calc non Af Amer >60 >60 mL/min   GFR calc Af Amer >60 >60 mL/min    Comment: (NOTE) The eGFR has been calculated using the CKD EPI equation. This calculation has not been validated in all clinical situations. eGFR's persistently <60 mL/min signify possible Chronic Kidney Disease.    Anion gap 9 5 - 15  Platelet inhibition p2y12 (Not at [facility])     Status: Abnormal   Collection Time: 02/21/17  7:06 AM  Result Value Ref Range   Platelet Function  P2Y12 14 (L) 194 - 418 PRU    Comment:        The literature has shown a direct correlation of PRU values over 230 with higher risks of thrombotic events.  Lower PRU values are associated with platelet inhibition.   Protime-INR     Status: None   Collection Time: 02/21/17  7:06 AM  Result Value Ref Range   Prothrombin Time 12.0 11.4 - 15.2 seconds   INR 0.89     Imaging: No results found.  Assessment/Plan 1. L ICA aneurysm   Plan for cerebral angiogram with possible  embolization and stenting.  Her labs have been reviewed.  P2Y12 is 14 and Dr. Estanislado Pandy is good with  this.  The rest of her labs have been reviewed and are good. Risks and benefits of cerebral angiogram with possible intervention were discussed with the patient including, but not limited to bleeding, infection, vascular injury or contrast induced renal failure.  This interventional procedure involves the use of X-rays and because of the nature of the planned procedure, it is possible that we will have prolonged use of X-ray fluoroscopy.  Potential radiation risks to you include (but are not limited to) the following: - A slightly elevated risk for cancer  several years later in life. This risk is typically less than 0.5% percent. This risk is low in comparison to the normal incidence of human cancer, which is 33% for women and 50% for men according to the [place]. - Radiation induced injury can include skin redness, resembling a rash, tissue breakdown / ulcers and hair loss (which can be temporary or permanent).   The likelihood of either of these occurring depends on the difficulty of the procedure and whether you are sensitive to radiation due to previous procedures, disease, or genetic conditions.   IF your procedure requires a prolonged use of radiation, you will be notified and given written instructions for further action.  It is your responsibility to monitor the irradiated area for the 2 weeks following the procedure and to notify your physician if you are concerned that you have suffered a radiation induced injury.    All of the patient's questions were answered, patient is agreeable to proceed.  Consent signed and in chart.   Thank you for this interesting consult.  I greatly enjoyed meeting Yolanda Espinoza and look forward to participating in their care.  A copy of this report was sent to the requesting provider on this date.  Electronically Signed: Henreitta Cea 02/21/2017, 8:57 AM   I spent a total of    25 Minutes in face to face in clinical consultation, greater than 50% of which was counseling/coordinating care for L ICA aneurysm

## 2017-02-21 NOTE — Progress Notes (Signed)
STAT MRI ordered by Neurology.  MRI tech called RN stating MRI machines are being clean due to bed bugs and will not be in use until at least 2000.  Neurology paged.  Will continue to monitor.

## 2017-02-21 NOTE — Transfer of Care (Signed)
Immediate Anesthesia Transfer of Care Note  Patient: Yolanda Espinoza  Procedure(s) Performed: EMBOLIZATION (N/A )  Patient Location: PACU  Anesthesia Type:General  Level of Consciousness: awake, alert , oriented and patient cooperative  Airway & Oxygen Therapy: Patient Spontanous Breathing and Patient connected to nasal cannula oxygen  Post-op Assessment: Report given to RN, Post -op Vital signs reviewed and stable, Patient moving all extremities X 4 and Patient able to stick tongue midline  Post vital signs: Reviewed and stable  Last Vitals:  Vitals:   02/21/17 0700  BP: (!) 173/78  Pulse: 74  Resp: 18  Temp: 36.6 C    Last Pain:  Vitals:   02/21/17 0700  TempSrc: Oral      Patients Stated Pain Goal: 3 (84/72/07 2182)  Complications: No apparent anesthesia complications

## 2017-02-21 NOTE — Anesthesia Procedure Notes (Signed)
Arterial Line Insertion Start/End1/30/2019 8:40 AM, 02/21/2017 8:50 AM Performed by: Renato Shin, CRNA, CRNA  Patient location: Pre-op. Preanesthetic checklist: patient identified, IV checked, site marked, risks and benefits discussed, surgical consent, monitors and equipment checked, pre-op evaluation, timeout performed and anesthesia consent Lidocaine 1% used for infiltration Left, radial was placed Catheter size: 20 G Hand hygiene performed , maximum sterile barriers used  and Seldinger technique used Allen's test indicative of satisfactory collateral circulation Attempts: 2 Procedure performed without using ultrasound guided technique. Following insertion, dressing applied and Biopatch. Post procedure assessment: normal and unchanged  Patient tolerated the procedure well with no immediate complications.

## 2017-02-21 NOTE — Progress Notes (Signed)
Transferring pt to MRI, will go off of cuff pressure to titrate cleviprex gtt for SBP 120-140.

## 2017-02-21 NOTE — Progress Notes (Signed)
Patient ID: Yolanda Espinoza, female   DOB: [birth year], 62 y.o.   MRN: [id number] INR . Had family conference with patients sons and daughter in law. Informed them of the findings of the CT brain. Awaiting neurology recommendations regarding further work up  to elucidate etiology of seizure ,and management of seizures. Patient to be kept intubated and sedated overnight per CCM. Consider MRI of the brain to evaluate for possible ischemic changes in am . Questions answered to their satisfaction and understanding.. Will D/C  IV heparin  at 8 00pm. S.Avrom Robarts MD

## 2017-02-22 ENCOUNTER — Other Ambulatory Visit: Payer: Self-pay

## 2017-02-22 ENCOUNTER — Inpatient Hospital Stay (HOSPITAL_COMMUNITY): Payer: BLUE CROSS/BLUE SHIELD

## 2017-02-22 ENCOUNTER — Encounter (HOSPITAL_COMMUNITY): Payer: Self-pay | Admitting: Interventional Radiology

## 2017-02-22 DIAGNOSIS — I63312 Cerebral infarction due to thrombosis of left middle cerebral artery: Secondary | ICD-10-CM

## 2017-02-22 DIAGNOSIS — Z978 Presence of other specified devices: Secondary | ICD-10-CM

## 2017-02-22 LAB — CBC
HCT: 28.5 % — ABNORMAL LOW (ref 36.0–46.0)
Hemoglobin: 9.3 g/dL — ABNORMAL LOW (ref 12.0–15.0)
MCH: 27.2 pg (ref 26.0–34.0)
MCHC: 32.6 g/dL (ref 30.0–36.0)
MCV: 83.3 fL (ref 78.0–100.0)
PLATELETS: 272 10*3/uL (ref 150–400)
RBC: 3.42 MIL/uL — ABNORMAL LOW (ref 3.87–5.11)
RDW: 15.3 % (ref 11.5–15.5)
WBC: 11.9 10*3/uL — ABNORMAL HIGH (ref 4.0–10.5)

## 2017-02-22 LAB — CBC WITH DIFFERENTIAL/PLATELET
BASOS ABS: 0 10*3/uL (ref 0.0–0.1)
BASOS PCT: 0 %
EOS ABS: 0 10*3/uL (ref 0.0–0.7)
Eosinophils Relative: 0 %
HEMATOCRIT: 27.2 % — AB (ref 36.0–46.0)
HEMOGLOBIN: 8.9 g/dL — AB (ref 12.0–15.0)
Lymphocytes Relative: 25 %
Lymphs Abs: 2.2 10*3/uL (ref 0.7–4.0)
MCH: 27 pg (ref 26.0–34.0)
MCHC: 32.7 g/dL (ref 30.0–36.0)
MCV: 82.4 fL (ref 78.0–100.0)
MONO ABS: 0.7 10*3/uL (ref 0.1–1.0)
MONOS PCT: 8 %
NEUTROS ABS: 5.9 10*3/uL (ref 1.7–7.7)
NEUTROS PCT: 67 %
Platelets: 259 10*3/uL (ref 150–400)
RBC: 3.3 MIL/uL — ABNORMAL LOW (ref 3.87–5.11)
RDW: 15 % (ref 11.5–15.5)
WBC: 8.8 10*3/uL (ref 4.0–10.5)

## 2017-02-22 LAB — BLOOD GAS, ARTERIAL
Acid-base deficit: 1.8 mmol/L (ref 0.0–2.0)
BICARBONATE: 21.8 mmol/L (ref 20.0–28.0)
Drawn by: 51147
FIO2: 30
LHR: 24 {breaths}/min
O2 Saturation: 98.4 %
PATIENT TEMPERATURE: 98.8
PEEP: 5 cmH2O
VT: 440 mL
pCO2 arterial: 32.9 mmHg (ref 32.0–48.0)
pH, Arterial: 7.436 (ref 7.350–7.450)
pO2, Arterial: 122 mmHg — ABNORMAL HIGH (ref 83.0–108.0)

## 2017-02-22 LAB — COMPREHENSIVE METABOLIC PANEL
ALK PHOS: 38 U/L (ref 38–126)
ALT: 6 U/L — AB (ref 14–54)
AST: 14 U/L — ABNORMAL LOW (ref 15–41)
Albumin: 3 g/dL — ABNORMAL LOW (ref 3.5–5.0)
Anion gap: 9 (ref 5–15)
BUN: 9 mg/dL (ref 6–20)
CALCIUM: 8.4 mg/dL — AB (ref 8.9–10.3)
CO2: 21 mmol/L — ABNORMAL LOW (ref 22–32)
CREATININE: 1.02 mg/dL — AB (ref 0.44–1.00)
Chloride: 109 mmol/L (ref 101–111)
GFR, EST NON AFRICAN AMERICAN: 58 mL/min — AB (ref >60)
Glucose, Bld: 104 mg/dL — ABNORMAL HIGH (ref 65–99)
Potassium: 3.7 mmol/L (ref 3.5–5.1)
Sodium: 139 mmol/L (ref 135–145)
Total Bilirubin: 0.3 mg/dL (ref 0.3–1.2)
Total Protein: 5.9 g/dL — ABNORMAL LOW (ref 6.5–8.1)

## 2017-02-22 LAB — APTT: APTT: 29 s (ref 24–36)

## 2017-02-22 LAB — PROTIME-INR
INR: 0.99
PROTHROMBIN TIME: 13 s (ref 11.4–15.2)

## 2017-02-22 LAB — MAGNESIUM: MAGNESIUM: 1.6 mg/dL — AB (ref 1.7–2.4)

## 2017-02-22 MED ORDER — AMLODIPINE BESYLATE 10 MG PO TABS: 10.0000 mg | ORAL_TABLET | Freq: Every day | ORAL | Status: DC

## 2017-02-22 MED ORDER — ORAL CARE MOUTH RINSE
15.0000 mL | Freq: Two times a day (BID) | OROMUCOSAL | Status: DC
  Administered 2017-02-22: 15 mL via OROMUCOSAL

## 2017-02-22 MED ORDER — AMLODIPINE BESYLATE 5 MG PO TABS
5.0000 mg | ORAL_TABLET | Freq: Every day | ORAL | Status: DC
  Administered 2017-02-22 – 2017-02-23 (×2): 5 mg via ORAL
  Filled 2017-02-22 (×2): qty 1

## 2017-02-22 MED ORDER — HYDRALAZINE HCL 20 MG/ML IJ SOLN: 20.0000 mg | INTRAMUSCULAR | Status: DC | PRN

## 2017-02-22 MED ORDER — ENOXAPARIN SODIUM 40 MG/0.4ML ~~LOC~~ SOLN
40.0000 mg | SUBCUTANEOUS | Status: DC
  Administered 2017-02-22 – 2017-02-24 (×3): 40 mg via SUBCUTANEOUS
  Filled 2017-02-22 (×3): qty 0.4

## 2017-02-22 MED ORDER — INFLUENZA VAC SPLIT QUAD 0.5 ML IM SUSY
0.5000 mL | PREFILLED_SYRINGE | INTRAMUSCULAR | Status: AC
  Administered 2017-02-23: 0.5 mL via INTRAMUSCULAR
  Filled 2017-02-22: qty 0.5

## 2017-02-22 NOTE — Procedures (Signed)
Extubation Procedure Note  Patient Details:   Name: Yolanda Espinoza DOB: 1955/07/25 MRN: [id number]   Airway Documentation:     Evaluation  O2 sats: stable throughout Complications: No apparent complications Patient did tolerate procedure well. Bilateral Breath Sounds: Diminished, Rhonchi   Yes   Pt extubated, per order, to 4L N/C.  No stridor noted.  RN @ bedside.  Donnetta Hail 02/22/2017, 8:18 AM

## 2017-02-22 NOTE — Evaluation (Signed)
Physical Therapy Evaluation Patient Details Name: Yolanda Espinoza MRN: [id number] DOB: 28-Jun-1955 Today's Date: 02/22/2017   History of Present Illness  Patient is a 62 year old African-American female with past medical history of ICA aneurysm on the left side she had history of hypertension heart murmur CVA peripheral vascular disease above found to have left ICA aneurysm she came in for some pipeline stent after the stent she was extubated sent to the ICU where she had severe altered mental status w/ seizure and MRI shows punctate acute left parietal lobe infarct.  Clinical Impression  Patient presents with decreased independence with mobility due to weakness, imbalance and possibly slowed coordination.  She will benefit from skilled PT in the acute setting and likely will not need follow up PT.  Will further assess DME needs for ambulation.      Follow Up Recommendations No PT follow up;Supervision/Assistance - 24 hour    Equipment Recommendations  Cane(possibly cane; TBA)    Recommendations for Other Services       Precautions / Restrictions Precautions Precautions: Fall      Mobility  Bed Mobility Overal bed mobility: Needs Assistance Bed Mobility: Supine to Sit     Supine to sit: Supervision     General bed mobility comments: assist for lines, etc  Transfers Overall transfer level: Needs assistance   Transfers: Sit to/from Stand Sit to Stand: Min guard         General transfer comment: to steady  Ambulation/Gait Ambulation/Gait assistance: Min assist Ambulation Distance (Feet): 150 Feet Assistive device: 1 person hand held assist Gait Pattern/deviations: Step-through pattern;Decreased stride length     General Gait Details: mild instability with assist for balance, safety  Stairs            Wheelchair Mobility    Modified Rankin (Stroke Patients Only) Modified Rankin (Stroke Patients Only) Pre-Morbid Rankin Score: No symptoms Modified Rankin:  Moderately severe disability     Balance Overall balance assessment: Needs assistance Sitting-balance support: Feet supported Sitting balance-Leahy Scale: Good     Standing balance support: No upper extremity supported Standing balance-Leahy Scale: Fair Standing balance comment: standing no UE support no LOB, HHA for ambulation                             Pertinent Vitals/Pain Pain Assessment: No/denies pain    Home Living Family/patient expects to be discharged to:: Private residence Living Arrangements: Spouse/significant other;Children Available Help at Discharge: Family Type of Home: Apartment Home Access: Level entry     Home Layout: One level Home Equipment: None Additional Comments: states spouse unable to assist, sons in room and report plant to take turns helping while pt recovering    Prior Function Level of Independence: Independent               Hand Dominance        Extremity/Trunk Assessment   Upper Extremity Assessment Upper Extremity Assessment: Overall WFL for tasks assessed(possibly slowed coordination)    Lower Extremity Assessment Lower Extremity Assessment: Overall WFL for tasks assessed       Communication   Communication: Other (comment)(hoarse after extubation)  Cognition Arousal/Alertness: Awake/alert Behavior During Therapy: WFL for tasks assessed/performed Overall Cognitive Status: Within Functional Limits for tasks assessed  General Comments      Exercises     Assessment/Plan    PT Assessment Patient needs continued PT services  PT Problem List Decreased strength;Decreased mobility;Decreased balance;Decreased knowledge of use of DME;Decreased coordination       PT Treatment Interventions DME instruction;Functional mobility training;Balance training;Patient/family education;Therapeutic activities;Stair training;Gait training;Therapeutic exercise     PT Goals (Current goals can be found in the Care Plan section)  Acute Rehab PT Goals Patient Stated Goal: To return to independent PT Goal Formulation: With patient Time For Goal Achievement: 03/01/17 Potential to Achieve Goals: Good    Frequency Min 4X/week   Barriers to discharge        Co-evaluation               AM-PAC PT "6 Clicks" Daily Activity  Outcome Measure Difficulty turning over in bed (including adjusting bedclothes, sheets and blankets)?: None Difficulty moving from lying on back to sitting on the side of the bed? : None Difficulty sitting down on and standing up from a chair with arms (e.g., wheelchair, bedside commode, etc,.)?: Unable Help needed moving to and from a bed to chair (including a wheelchair)?: A Little Help needed walking in hospital room?: A Little Help needed climbing 3-5 steps with a railing? : A Little 6 Click Score: 18    End of Session Equipment Utilized During Treatment: Gait belt Activity Tolerance: Patient tolerated treatment well Patient left: in chair;with call bell/phone within reach Nurse Communication: Mobility status PT Visit Diagnosis: Other abnormalities of gait and mobility (R26.89)    Time: 8550-1586 PT Time Calculation (min) (ACUTE ONLY): 23 min   Charges:   PT Evaluation $PT Eval Moderate Complexity: 1 Mod PT Treatments $Gait Training: 8-22 mins   PT G CodesMagda Kiel, [place] [phone] 02/22/2017   Reginia Naas 02/22/2017, 4:42 PM

## 2017-02-22 NOTE — Procedures (Addendum)
  ELECTROENCEPHALOGRAM REPORT  Date of Study: 02/22/17  Patient's Name: Yolanda Espinoza MRN: [id number] Date of Birth: November 25, 1955  Referring Provider:   Clinical History: Yolanda Espinoza is a 62 y.o. female is  history of a left ICA aneurysmm, HTN, heart murmur, CVA, PVD, prior ruptured aneurysm in 2003, HLD, CHF who underwent pipeline stent embolization today. Patient doing well, answering questions and oriented. Around 3pm 01/30 she had a generalized tonic clonic seizure lasting 32mn, After seizure, patient was post-ictal with poor airway protection. Her PCO2 was elevated and she was intubated for airway protection. Prior to intubation, not following commands but withdrawing equally to pain in all 4 extremities. Following intubation, a stat CT head was obtained and showed no hemorrhage. MRI Brain showed 2 punctate  infarcts likely following pipeline embolization procedure  with patient having no neurological deficits. MRI also showed several foci of T2 FLAIR hyperintense signal abnormality that could represent prior infarcts vs microvascular changes. She was loaded with Keppra and extubated 01/31 at 8:18 AM. Pt currently not sedated.   Medications: Scheduled Meds: . amLODipine  5 mg Oral Daily  . aspirin  162.5 mg Oral Q breakfast  . chlorhexidine gluconate (MEDLINE KIT)  15 mL Mouth Rinse BID  . enoxaparin (LOVENOX) injection  40 mg Subcutaneous Q24H  . [START ON 02/23/2017] Influenza vac split quadrivalent PF  0.5 mL Intramuscular Tomorrow-1000  . mouth rinse  15 mL Mouth Rinse BID  . pantoprazole (PROTONIX) IV  40 mg Intravenous Q24H  . ticagrelor  90 mg Oral Daily   Continuous Infusions: . clevidipine Stopped (02/22/17 1317)  . lactated ringers 10 mL/hr at 02/21/17 0733  . levETIRAcetam Stopped (02/22/17 1641)  . niCARDipine Stopped (02/21/17 1430)  . propofol (DIPRIVAN) infusion Stopped (02/22/17 0800)   PRN Meds:.acetaminophen **OR** acetaminophen (TYLENOL) oral liquid 160 mg/5 mL  **OR** acetaminophen, hydrALAZINE            Technical Summary: This is a standard 16 channel EEG recording performed according to the international 10-20 electrode system.  AP bipolar, transverse bipolar, and referential montages were obtained, and digitally reformatted as necessary.  Duration of tracing: 23:03  Description: In the awake state there is a 6-7 Hz theta rhythm seen from the posterior head regions in a symmetric fashion.  Drowsiness is noted by vertex slowing, and stage 2 sleep with spindles.  Intermittent generalized, frontally predominant '3Hz'$  slowing is seen, lasting 1-2 seconds, during transition to drowsiness.   Neither hyperventilation or photic stimulation were performed.  EKG was monitored and noted to be sinus rhythym with an average heart rate of 72 bpm.  No epileptiform changes were noted.  Impression: This is an abnormal EEG due to mild background slowing.  This is a non-specific finding that can be seen with toxic, metabolic, diffuse, or multifocal structural processes.  No definite epileptiform changes were noted.   A single EEG without epileptiform changes does not exclude the diagnosis of epilepsy. Clinical correlation advised.   RCarvel Getting M.D. [facility] [phone]

## 2017-02-22 NOTE — Progress Notes (Signed)
Patient transferred to [phone] with belongings. RN at bedside.

## 2017-02-22 NOTE — Progress Notes (Signed)
Supervising Physician: Luanne Bras  Patient Status:  [facility] - In-pt  Chief Complaint: Paraopthalmic aneurysms  Subjective: Patient extubated this AM.  Communicating, following commands, moving all extremities.  Does not remember events of yesterday afternoon.  Allergies: Ace inhibitors and Aspirin  Medications: Prior to Admission medications   Medication Sig Start Date End Date Taking? Authorizing Provider  amLODipine (NORVASC) 10 MG tablet Take 10 mg by mouth daily.   Yes [provider]  aspirin EC 81 MG tablet Take 1 tablet (81 mg total) by mouth daily. 03/11/15  Yes Nahser, Wonda Cheng, MD  Calcium Carb-Cholecalciferol (CALCIUM 600 + D PO) Take 1 tablet by mouth daily.   Yes [provider]  cyclobenzaprine (FLEXERIL) 10 MG tablet Take 1 tablet (10 mg total) by mouth at bedtime as needed for muscle spasms. 01/05/17  Yes Vicie Mutters, PA-C  ferrous sulfate 325 (65 FE) MG tablet Take 325 mg by mouth daily with breakfast.   Yes [provider]  furosemide (LASIX) 40 MG tablet Take 1 tablet (40 mg total) by mouth daily as needed for edema. 10/23/16 02/09/17 Yes Weaver, Scott T, PA-C  magnesium gluconate (MAGONATE) 500 MG tablet Take 500 mg by mouth daily.   Yes [provider]  Multiple Vitamins-Minerals (MULTIVITAMIN WITH MINERALS) tablet Take 1 tablet by mouth daily.   Yes [provider]  olmesartan (BENICAR) 40 MG tablet Take 1 tablet (40 mg total) by mouth daily. 10/16/16  Yes Nahser, Wonda Cheng, MD  Omega-3 Fatty Acids (FISH OIL) 1200 MG CAPS Take 2,400 mg by mouth daily.    Yes [provider]  PREMARIN 0.9 MG tablet TAKE 1 TABLET BY MOUTH ONCE DAILY.(NEED OFFICE VISIT BEFORE FURTHER REFILLS). Patient taking differently: TAKE 1 TABLET (0.9mg ) BY MOUTH ONCE DAILY. 01/28/17  Yes Unk Pinto, MD  ticagrelor (BRILINTA) 90 MG TABS tablet Take 90 mg by mouth 2 (two) times daily.    Yes [provider]  vitamin B-12  (CYANOCOBALAMIN) 1000 MCG tablet Take 1,000 mcg by mouth daily.   Yes [provider]     Vital Signs: BP 124/64   Pulse 86   Temp 98 F (36.7 C) (Axillary)   Resp 16   Ht 5\' 11"  (1.803 m)   Wt 216 lb (98 kg)   SpO2 100%   BMI 30.13 kg/m   Physical Exam  Constitutional: She is oriented to person, place, and time. She appears well-developed.  Cardiovascular: Normal rate, regular rhythm, normal heart sounds and intact distal pulses.  Pulmonary/Chest: Effort normal and breath sounds normal. No respiratory distress.  Extuabted  Abdominal: Soft.  Neurological: She is alert and oriented to person, place, and time.  Moving all extremities, strength remains intact in all 4 extremities.  Tongue midline. Speech clear.   Skin: Skin is warm and dry.  Groin soft.  Nursing note and vitals reviewed.   Imaging: Ct Head Wo Contrast  Result Date: 02/21/2017 CLINICAL DATA:  Witnessed seizure. Earlier today, placement of pipeline flow diverter with endovascular coiling of 2 LEFT ICA aneurysms. EXAM: CT HEAD WITHOUT CONTRAST TECHNIQUE: Contiguous axial images were obtained from the base of the skull through the vertex without intravenous contrast. COMPARISON:  MR brain 11/08/2015. Cerebral angiography, earlier today FINDINGS: Brain: No acute stroke, acute hemorrhage, mass lesion, hydrocephalus, or extra-axial fluid. Normal cerebral volume. Small focus of hypoattenuation in the LEFT frontal subcortical white matter appears unchanged from prior MR, when technique differences are considered (series 3, image 18, for instance),  consistent with small vessel disease. No subarachnoid hemorrhage is evident. Patient has undergone placement of pipeline device on the RIGHT and most recently on the LEFT. There are two endovascular coil masses related to paraophthalmic aneurysms on the LEFT. These appear uncomplicated. Vascular: No signs of large vessel occlusion. Skull: Normal. Negative for fracture or focal  lesion. Sinuses/Orbits: No acute finding. Other: None. IMPRESSION: Unremarkable appearing CT of the head post angiography. No intracranial hemorrhage is evident. No areas are identified concerning for acute infarction. Coils related to endovascular aneurysm treatment and BILATERAL ICA pipeline devices appear appropriately positioned. Electronically Signed   By: Staci Righter M.D.   On: 02/21/2017 15:46   Mr Brain Wo Contrast  Result Date: 02/21/2017 CLINICAL DATA:  62 y/o  F; witnessed seizure. EXAM: MRI HEAD WITHOUT CONTRAST TECHNIQUE: Multiplanar, multiecho pulse sequences of the brain and surrounding structures were obtained without intravenous contrast. COMPARISON:  02/21/2017 CT head.  11/08/2015 MRI of the head. FINDINGS: Brain: Punctate focus of reduced diffusion within the left parietal cortex (series 3, image 39) possible additional punctate focus within left insula (series 3, image 23) compatible with acute/early subacute infarction. No associated hemorrhage or mass effect. Severalnonspecific foci of T2 FLAIR hyperintense signal abnormality in subcortical and periventricular white matter are compatible withmildchronic microvascular ischemic changes for age. Mildbrain parenchymal volume loss. Foci of T2 FLAIR hyperintensity correspond to foci of hypoattenuation on same-day CT of head and are stable from prior MRI. No abnormal susceptibility hypointensity to indicate intracranial hemorrhage. No focal mass effect. No extra-axial collection, hydrocephalus, or effacement of basilar cisterns. No gross structural abnormality of the brain. Hippocampi are symmetric in size and signal. No disorder cortical formation, dysplasia, or heterotopia identified. Vascular: Susceptibility artifact is present from left ICA aneurysm coil mass. Skull and upper cervical spine: Normal marrow signal. Sinuses/Orbits: Paranasal sinus mucosal thickening with fluid levels in the ethmoid and sphenoid sinuses as well as debris in the  nasopharynx likely due to intubation. No abnormal signal of mastoid air cells. Orbits are unremarkable. Other: None. IMPRESSION: 1. Punctate acute/early subacute cortical infarct in left parietal lobe and possible additional punctate infarct in left insula. No associated hemorrhage or mass effect. 2. Stable mild chronic microvascular ischemic changes and mild parenchymal volume loss of the brain. 3. No structural cause of seizure identified. These results will be called to the ordering clinician or representative by the Radiologist Assistant, and communication documented in the PACS or zVision Dashboard. Electronically Signed   By: Kristine Garbe M.D.   On: 02/21/2017 22:41   Dg Chest Port 1 View  Result Date: 02/22/2017 CLINICAL DATA:  ett,HTN, heart murmur, CVA, PVD, EXAM: PORTABLE CHEST 1 VIEW COMPARISON:  02/21/2017 FINDINGS: Endotracheal tube 4.5 cm from carina. Normal cardiac silhouette. Lungs are clear. No pneumothorax. IMPRESSION: Intubation without complication. Electronically Signed   By: Suzy Bouchard M.D.   On: 02/22/2017 08:52   Dg Chest Port 1 View  Result Date: 02/21/2017 CLINICAL DATA:  Check endotracheal tube placement EXAM: PORTABLE CHEST 1 VIEW COMPARISON:  None. FINDINGS: Cardiac shadow is mildly enlarged. Endotracheal tube is noted 1.7 cm above the carina. Lungs are well aerated bilaterally. No focal infiltrate is seen. No bony abnormality is noted. IMPRESSION: Endotracheal tube as described.  No acute abnormality noted. Electronically Signed   By: Inez Catalina M.D.   On: 02/21/2017 15:54    Labs:  CBC: Recent Labs    01/05/17 1147 02/14/17 1509 02/21/17 0706 02/22/17 0500  WBC 7.1 7.3 7.5 8.8  HGB 10.0* 9.8* 9.8* 8.9*  HCT 30.3* 30.8* 29.6* 27.2*  PLT 275 240 253 259    COAGS: Recent Labs    09/04/16 0629 10/20/16 1620 02/14/17 1509 02/21/17 0706 02/22/17 0500  INR 0.90 0.93 0.92 0.89 0.99  APTT 32 31 32  --  29    BMP: Recent Labs     02/14/17 1509 02/21/17 0706 02/21/17 2028 02/22/17 0500  NA 138 137 138 139  K 4.0 3.7 4.0 3.7  CL 105 106 109 109  CO2 22 22 20* 21*  GLUCOSE 109* 96 130* 104*  BUN 11 12 8 9   CALCIUM 9.2 9.3 8.8* 8.4*  CREATININE 1.05* 0.99 0.97 1.02*  GFRNONAA 56* >60 >60 58*  GFRAA >60 >60 >60 >60    LIVER FUNCTION TESTS: Recent Labs    02/14/17 1509 02/21/17 0706 02/21/17 2028 02/22/17 0500  BILITOT 0.4 0.5 0.4 0.3  AST 17 17 13* 14*  ALT 8* 8* 8* 6*  ALKPHOS 43 45 40 38  PROT 7.0 7.2 6.4* 5.9*  ALBUMIN 3.7 3.7 3.2* 3.0*    Assessment and Plan: S/p placement of a pipeline flow diverter across x2 L ICA para ophthalmic aneurysm Patient with witnessed seizure yesterday afternoon. She was intubated for airway protection and has since been extubated.  She is alert and oriented this AM.  Moving all extremities.  Denies headache or pain.  Groin soft. Dressing remains in place.  No evidence of hematoma or pseudoaneurysm after oozing yesterday.  Pulses intact bilaterally.   MRI obtained overnight shows punctate acute left parietal lobe infarct. Patient being followed by Neurology for ongoing stroke and seizure work-up.  Continues Brilinta 90 mg daily.   Electronically Signed: Docia Barrier, PA 02/22/2017, 9:45 AM   I spent a total of 15 Minutes at the the patient's bedside AND on the patient's hospital floor or unit, greater than 50% of which was counseling/coordinating care for paraopthalmic aneurysm.

## 2017-02-22 NOTE — Progress Notes (Signed)
Name: Yolanda Espinoza MRN: [id number] DOB: 1955-12-23    ADMISSION DATE:  02/21/2017 CONSULTATION DATE: February 21, 2017  REFERRING MD : Neurology service  CHIEF COMPLAINT: Seizure acute respiratory failure acute hypercapnic  BRIEF PATIENT DESCRIPTION: Intubated after seizure altered mental status pH of 7 unable to protect her airways MRI of overnight showed acute stroke after stent placement  SIGNIFICANT EVENTS  Acute stroke after stent placement in the left ICA  STUDIES:  MRI with acute left stroke   HISTORY OF PRESENT ILLNESS: This is a 62 year old African-American female with past medical history of ICA aneurysm on the left side she had history of hypertension heart murmur CVA peripheral vascular disease above found to have left ICA aneurysm she came in for some pipeline stent after the stent she was extubated sent to the ICU where she had severe altered mental status seizure postictal and ABG was done and the patient had pH of 7 PCO2 66 unable to protect airways though the decision was made to intubate her stat and sent her for CAT scan.  Patient was not able to give any history history is included in previous medical records family was by the bedside daughter consent was obtained from her.    PAST MEDICAL HISTORY :   has a past medical history of Anemia, Arthritis, Chronic diastolic heart failure ([place]), GERD (gastroesophageal reflux disease), Heart murmur, Hyperlipidemia, Hypertension (may, 1973), Mitral regurgitation, OSA on CPAP, Peripheral vascular disease ([place]), Stroke ([place]) (07), Subarachnoid hemorrhage due to ruptured aneurysm ([place]), and Unspecified vitamin D deficiency.  has a past surgical history that includes Aneurysm coiling; head surgery; Abdominal hysterectomy; Tonsillectomy and adenoidectomy; Radiology with anesthesia (N/A, 04/23/2013); IR 3D Independent Wkst (09/04/2016); IR Angiogram Extremity Left (09/04/2016); IR ANGIO INTRA EXTRACRAN SEL COM CAROTID INNOMINATE BILAT MOD  SED (09/04/2016); IR ANGIO VERTEBRAL SEL VERTEBRAL BILAT MOD SED (09/04/2016); IR Radiologist Eval & Mgmt (10/11/2016); Radiology with anesthesia (N/A, 10/30/2016); and Tubal ligation. Prior to Admission medications   Medication Sig Start Date End Date Taking? Authorizing Provider  amLODipine (NORVASC) 10 MG tablet Take 10 mg by mouth daily.   Yes [provider]  aspirin EC 81 MG tablet Take 1 tablet (81 mg total) by mouth daily. 03/11/15  Yes Nahser, Wonda Cheng, MD  Calcium Carb-Cholecalciferol (CALCIUM 600 + D PO) Take 1 tablet by mouth daily.   Yes [provider]  cyclobenzaprine (FLEXERIL) 10 MG tablet Take 1 tablet (10 mg total) by mouth at bedtime as needed for muscle spasms. 01/05/17  Yes Vicie Mutters, PA-C  ferrous sulfate 325 (65 FE) MG tablet Take 325 mg by mouth daily with breakfast.   Yes [provider]  furosemide (LASIX) 40 MG tablet Take 1 tablet (40 mg total) by mouth daily as needed for edema. 10/23/16 02/09/17 Yes Weaver, Scott T, PA-C  magnesium gluconate (MAGONATE) 500 MG tablet Take 500 mg by mouth daily.   Yes [provider]  Multiple Vitamins-Minerals (MULTIVITAMIN WITH MINERALS) tablet Take 1 tablet by mouth daily.   Yes [provider]  olmesartan (BENICAR) 40 MG tablet Take 1 tablet (40 mg total) by mouth daily. 10/16/16  Yes Nahser, Wonda Cheng, MD  Omega-3 Fatty Acids (FISH OIL) 1200 MG CAPS Take 2,400 mg by mouth daily.    Yes [provider]  PREMARIN 0.9 MG tablet TAKE 1 TABLET BY MOUTH ONCE DAILY.(NEED OFFICE VISIT BEFORE FURTHER REFILLS). 01/28/17  Yes Unk Pinto, MD  ticagrelor (BRILINTA) 90 MG TABS tablet Take 90 mg by  mouth daily.   Yes [provider]  vitamin B-12 (CYANOCOBALAMIN) 1000 MCG tablet Take 1,000 mcg by mouth daily.   Yes [provider]   Allergies  Allergen Reactions  . Ace Inhibitors Nausea And Vomiting  . Aspirin Nausea And Vomiting    Can take coated Aspirin    FAMILY  HISTORY:  family history includes Hypertension in her father, sister, sister, and sister; Kidney disease in her father. SOCIAL HISTORY:  reports that she quit smoking about 18 years ago. Her smoking use included cigarettes. She has a 0.75 pack-year smoking history. she has never used smokeless tobacco. She reports that she does not drink alcohol or use drugs.  REVIEW OF SYSTEMS:   Unable to obtain due to patient's conditions  SUBJECTIVE:   VITAL SIGNS: Temp:  [97.3 F (36.3 C)-98.8 F (37.1 C)] 98.8 F (37.1 C) (01/31 0400) Pulse Rate:  [61-106] 87 (01/31 0745) Resp:  [10-25] 24 (01/31 0745) BP: (105-153)/(61-126) 121/79 (01/31 0745) SpO2:  [94 %-100 %] 100 % (01/31 0745) Arterial Line BP: (105-178)/(46-89) 152/61 (01/31 0745) FiO2 (%):  [30 %-100 %] 30 % (01/31 0749)  PHYSICAL EXAMINATION: General:  NAD , pleasant and hungry , wants to eat .  Intubated not sedated interactive Neuro:  WNL , AOX3 , EOMI , CN II-XII intact , UL , LL strength is symmetrical and 5/5 HEENT:  atraumatic , no jaundice , dry mucous membranes  Cardiovascular:  Irregular irregular , ESM 2/6 in the aortic area  Lungs:  CTA bilateral , no wheezing or crackles  Abdomen:  Soft lax +BS , no tenderness . Musculoskeletal:  WNL , normal pulses  Skin:  No rash    Recent Labs  Lab 02/21/17 0706 02/21/17 2028 02/22/17 0500  NA 137 138 139  K 3.7 4.0 3.7  CL 106 109 109  CO2 22 20* 21*  BUN 12 8 9   CREATININE 0.99 0.97 1.02*  GLUCOSE 96 130* 104*   Recent Labs  Lab 02/21/17 0706 02/22/17 0500  HGB 9.8* 8.9*  HCT 29.6* 27.2*  WBC 7.5 8.8  PLT 253 259   Ct Head Wo Contrast  Result Date: 02/21/2017 CLINICAL DATA:  Witnessed seizure. Earlier today, placement of pipeline flow diverter with endovascular coiling of 2 LEFT ICA aneurysms. EXAM: CT HEAD WITHOUT CONTRAST TECHNIQUE: Contiguous axial images were obtained from the base of the skull through the vertex without intravenous contrast. COMPARISON:  MR  brain 11/08/2015. Cerebral angiography, earlier today FINDINGS: Brain: No acute stroke, acute hemorrhage, mass lesion, hydrocephalus, or extra-axial fluid. Normal cerebral volume. Small focus of hypoattenuation in the LEFT frontal subcortical white matter appears unchanged from prior MR, when technique differences are considered (series 3, image 18, for instance), consistent with small vessel disease. No subarachnoid hemorrhage is evident. Patient has undergone placement of pipeline device on the RIGHT and most recently on the LEFT. There are two endovascular coil masses related to paraophthalmic aneurysms on the LEFT. These appear uncomplicated. Vascular: No signs of large vessel occlusion. Skull: Normal. Negative for fracture or focal lesion. Sinuses/Orbits: No acute finding. Other: None. IMPRESSION: Unremarkable appearing CT of the head post angiography. No intracranial hemorrhage is evident. No areas are identified concerning for acute infarction. Coils related to endovascular aneurysm treatment and BILATERAL ICA pipeline devices appear appropriately positioned. Electronically Signed   By: Staci Righter M.D.   On: 02/21/2017 15:46   Mr Brain Wo Contrast  Result Date: 02/21/2017 CLINICAL DATA:  62 y/o  F; witnessed seizure.  EXAM: MRI HEAD WITHOUT CONTRAST TECHNIQUE: Multiplanar, multiecho pulse sequences of the brain and surrounding structures were obtained without intravenous contrast. COMPARISON:  02/21/2017 CT head.  11/08/2015 MRI of the head. FINDINGS: Brain: Punctate focus of reduced diffusion within the left parietal cortex (series 3, image 39) possible additional punctate focus within left insula (series 3, image 23) compatible with acute/early subacute infarction. No associated hemorrhage or mass effect. Severalnonspecific foci of T2 FLAIR hyperintense signal abnormality in subcortical and periventricular white matter are compatible withmildchronic microvascular ischemic changes for age. Mildbrain  parenchymal volume loss. Foci of T2 FLAIR hyperintensity correspond to foci of hypoattenuation on same-day CT of head and are stable from prior MRI. No abnormal susceptibility hypointensity to indicate intracranial hemorrhage. No focal mass effect. No extra-axial collection, hydrocephalus, or effacement of basilar cisterns. No gross structural abnormality of the brain. Hippocampi are symmetric in size and signal. No disorder cortical formation, dysplasia, or heterotopia identified. Vascular: Susceptibility artifact is present from left ICA aneurysm coil mass. Skull and upper cervical spine: Normal marrow signal. Sinuses/Orbits: Paranasal sinus mucosal thickening with fluid levels in the ethmoid and sphenoid sinuses as well as debris in the nasopharynx likely due to intubation. No abnormal signal of mastoid air cells. Orbits are unremarkable. Other: None. IMPRESSION: 1. Punctate acute/early subacute cortical infarct in left parietal lobe and possible additional punctate infarct in left insula. No associated hemorrhage or mass effect. 2. Stable mild chronic microvascular ischemic changes and mild parenchymal volume loss of the brain. 3. No structural cause of seizure identified. These results will be called to the ordering clinician or representative by the Radiologist Assistant, and communication documented in the PACS or zVision Dashboard. Electronically Signed   By: Kristine Garbe M.D.   On: 02/21/2017 22:41   Dg Chest Port 1 View  Result Date: 02/21/2017 CLINICAL DATA:  Check endotracheal tube placement EXAM: PORTABLE CHEST 1 VIEW COMPARISON:  None. FINDINGS: Cardiac shadow is mildly enlarged. Endotracheal tube is noted 1.7 cm above the carina. Lungs are well aerated bilaterally. No focal infiltrate is seen. No bony abnormality is noted. IMPRESSION: Endotracheal tube as described.  No acute abnormality noted. Electronically Signed   By: Inez Catalina M.D.   On: 02/21/2017 15:54    ASSESSMENT /  PLAN:  --New onset seizure after stent placement associated with acute stroke on the MRI continue with aspirin and Brilinta Keppra neuro workup by neuro team. --Left ICA stent for pipeline aneurysm with stent was placed. --Acute hypercapnic respiratory failure seems to be doing better will extubate this morning. --Hypertension keep blood pressure between 120-140. --Once the patient is extubated continue with p.o. aspirin and Brilinta.  Statins --Please call us with question of the patient is extubated she is stable will sign off at the end of the day please call us with questions thank you for this consultation.  Pulmonary and [place] Pager: [phone]  02/22/2017, 8:11 AM

## 2017-02-22 NOTE — Progress Notes (Signed)
Reason for consult: Seizure  Subjective:patient extubated and doing well   ROS: negative except above   Examination  Vital signs in last 24 hours: Temp:  [98 F (36.7 C)-98.8 F (37.1 C)] 98.3 F (36.8 C) (01/31 1058) Pulse Rate:  [61-100] 68 (01/31 1500) Resp:  [11-25] 14 (01/31 1500) BP: (101-153)/(50-126) 117/63 (01/31 1500) SpO2:  [98 %-100 %] 100 % (01/31 1500) Arterial Line BP: (117-160)/(49-89) 134/58 (01/31 1130) FiO2 (%):  [30 %-60 %] 30 % (01/31 0749)  General: Not in distress, cooperative CVS: pulse-normal rate and rhythm RS: breathing comfortably Extremities: normal   Neuro: MS: Alert, oriented, follows commands CN: pupils equal and reactive,  EOMI, face symmetric, tongue midline, normal sensation over face, Motor: 5/5 strength in all 4 extremities Coordination: normal Gait: not tested  Basic Metabolic Panel: Recent Labs  Lab 02/21/17 0706 02/21/17 2028 02/22/17 0500  NA 137 138 139  K 3.7 4.0 3.7  CL 106 109 109  CO2 22 20* 21*  GLUCOSE 96 130* 104*  BUN 12 8 9   CREATININE 0.99 0.97 1.02*  CALCIUM 9.3 8.8* 8.4*  MG  --   --  1.6*    CBC: Recent Labs  Lab 02/21/17 0706 02/22/17 0500 02/22/17 1232  WBC 7.5 8.8 11.9*  NEUTROABS  --  5.9  --   HGB 9.8* 8.9* 9.3*  HCT 29.6* 27.2* 28.5*  MCV 84.3 82.4 83.3  PLT 253 259 272     Coagulation Studies: Recent Labs    02/21/17 0706 02/22/17 0500  LABPROT 12.0 13.0  INR 0.89 0.99    Imaging Reviewed:     ASSESSMENT AND PLAN  Yolanda Espinoza is a 62 y.o. female is history of a left ICA aneurysmm, HTN, heart murmur, CVA, PVD, prior ruptured aneurysm in 2003, HLD, CHF who underwent pipeline stent embolization yesterday and hd a seizure lasting 24min. She was intubated for airway protection. She was started on Keppra and has had no further seizures. MRI Brain showed 2 punctate  infarcts likely following pipeline embolization procedure  with patient having no neurological deficits. MRI also  showed several foci of T2 FLAIR hyperintense signal abnormality that could represent prior infarcts vs microvascular changes.   Seizure   Given history of ruptured aneurysm and non specific T2 white matter changes more prominent in left frontal lobe would recommend continuing AED on discharge Continue Keppra 1g BID  Follow up with outpatient neurologist  R EEG ( can done as outpatient)  Seizure precautions, counseled no driving x 18months   Tere Mcconaughey [facility] Pager Number [phone] For questions after 7pm please refer to AMION to reach the Neurologist on call

## 2017-02-22 NOTE — Progress Notes (Signed)
MRI reveals a punctate acute/early subacute cortical infarct in left parietal lobe and possible additional punctate infarct in left insula. No associated hemorrhage or mass effect. Stable mild chronic microvascular ischemic changes and mild parenchymal volume loss of the brain are noted.   A/R: 62 year old female with GTC seizure. Punctate acute left parietal lobe infarction seen on MRI brain.  1. Continue ASA 2. Continue Keppra 3. Will need full stroke work up.   Electronically signed: Dr. Kerney Elbe

## 2017-02-22 NOTE — Care Management Note (Signed)
Case Management Note  Patient Details  Name: Yolanda Espinoza MRN: [id number] Date of Birth: 07/18/1955  Subjective/Objective:  Pt admitted on 02/21/17 s/p placement of a pipeline flow diverter across x 2 LT ICA para ophthalmic aneurysms.  Pt with seizure activity post procedure, requiring intubation.  PTA, pt independent, lives at home with spouse.                    Action/Plan: Pt extubated earlier today.  Will follow for discharge planning as pt progresses.    Expected Discharge Date:                  Expected Discharge Plan:     In-House Referral:     Discharge planning Services  CM Consult  Post Acute Care Choice:    Choice offered to:     DME Arranged:    DME Agency:     HH Arranged:    HH Agency:     Status of Service:  In process, will continue to follow  If discussed at Long Length of Stay Meetings, dates discussed:    Additional Comments:  Reinaldo Raddle, RN, BSN  Trauma/Neuro ICU Case Manager [phone]

## 2017-02-22 NOTE — Progress Notes (Signed)
RT transported pt to CT and back to [id number] without event.

## 2017-02-22 NOTE — Progress Notes (Signed)
EEG Completed; Results Pending  

## 2017-02-23 LAB — COMPREHENSIVE METABOLIC PANEL
ALBUMIN: 3 g/dL — AB (ref 3.5–5.0)
ALK PHOS: 38 U/L (ref 38–126)
ALT: 8 U/L — AB (ref 14–54)
AST: 14 U/L — ABNORMAL LOW (ref 15–41)
Anion gap: 6 (ref 5–15)
BUN: 8 mg/dL (ref 6–20)
CALCIUM: 8.7 mg/dL — AB (ref 8.9–10.3)
CO2: 24 mmol/L (ref 22–32)
CREATININE: 1.14 mg/dL — AB (ref 0.44–1.00)
Chloride: 110 mmol/L (ref 101–111)
GFR calc non Af Amer: 51 mL/min — ABNORMAL LOW (ref >60)
GFR, EST AFRICAN AMERICAN: 59 mL/min — AB (ref >60)
Glucose, Bld: 84 mg/dL (ref 65–99)
Potassium: 3.8 mmol/L (ref 3.5–5.1)
SODIUM: 140 mmol/L (ref 135–145)
Total Bilirubin: 0.4 mg/dL (ref 0.3–1.2)
Total Protein: 5.9 g/dL — ABNORMAL LOW (ref 6.5–8.1)

## 2017-02-23 LAB — CBC WITH DIFFERENTIAL/PLATELET
BASOS ABS: 0 10*3/uL (ref 0.0–0.1)
BASOS PCT: 0 %
EOS ABS: 0.1 10*3/uL (ref 0.0–0.7)
Eosinophils Relative: 1 %
HCT: 26.3 % — ABNORMAL LOW (ref 36.0–46.0)
Hemoglobin: 8.5 g/dL — ABNORMAL LOW (ref 12.0–15.0)
Lymphocytes Relative: 33 %
Lymphs Abs: 2.5 10*3/uL (ref 0.7–4.0)
MCH: 27.7 pg (ref 26.0–34.0)
MCHC: 32.3 g/dL (ref 30.0–36.0)
MCV: 85.7 fL (ref 78.0–100.0)
Monocytes Absolute: 0.5 10*3/uL (ref 0.1–1.0)
Monocytes Relative: 6 %
NEUTROS PCT: 60 %
Neutro Abs: 4.6 10*3/uL (ref 1.7–7.7)
Platelets: 232 10*3/uL (ref 150–400)
RBC: 3.07 MIL/uL — AB (ref 3.87–5.11)
RDW: 16.1 % — ABNORMAL HIGH (ref 11.5–15.5)
WBC: 7.7 10*3/uL (ref 4.0–10.5)

## 2017-02-23 LAB — PROTIME-INR
INR: 0.92
Prothrombin Time: 12.2 seconds (ref 11.4–15.2)

## 2017-02-23 MED ORDER — LEVETIRACETAM 500 MG PO TABS
1000.0000 mg | ORAL_TABLET | Freq: Two times a day (BID) | ORAL | Status: DC
  Administered 2017-02-23 – 2017-02-24 (×2): 1000 mg via ORAL
  Filled 2017-02-23 (×2): qty 2

## 2017-02-23 MED ORDER — AMLODIPINE BESYLATE 10 MG PO TABS
10.0000 mg | ORAL_TABLET | Freq: Every day | ORAL | Status: DC
  Administered 2017-02-24: 10 mg via ORAL
  Filled 2017-02-23: qty 1

## 2017-02-23 MED ORDER — ASPIRIN EC 81 MG PO TBEC
81.0000 mg | DELAYED_RELEASE_TABLET | Freq: Every day | ORAL | Status: DC
  Administered 2017-02-23 – 2017-02-24 (×2): 81 mg via ORAL
  Filled 2017-02-23 (×2): qty 1

## 2017-02-23 MED ORDER — CYCLOBENZAPRINE HCL 10 MG PO TABS: 10.0000 mg | ORAL_TABLET | Freq: Every evening | ORAL | Status: DC | PRN

## 2017-02-23 MED ORDER — TICAGRELOR 90 MG PO TABS
90.0000 mg | ORAL_TABLET | Freq: Two times a day (BID) | ORAL | Status: DC
  Administered 2017-02-23 – 2017-02-24 (×2): 90 mg via ORAL
  Filled 2017-02-23 (×3): qty 1

## 2017-02-23 NOTE — Progress Notes (Signed)
Yolanda TEAM 1 - Stepdown/ICU TEAM  Yolanda Espinoza  JQB:[id number] DOB: [birth year] DOA: 02/21/2017 PCP: Vicie Mutters, PA-C    Brief Narrative:  62yo female with history of HTN, heart murmur, CVA, and peripheral vascular disease who was found to have a left ICA aneurysm.  She came in for a pipeline stent procedure, and after the stent she was sent to the ICU where she had severe altered mental status and a seizure postictal.  She was unable to protect her airway and had to be intubated.   Significant Events: 1/30 admit for planned IR procedure - seizure and resp arrest post procedure   Subjective: The patient is sitting up in bed eating supper.  She denies headache chest pain shortness of breath fever chills nausea or vomiting.  Assessment & Plan:  S/p pipeline flow diverter across x2 Lt ICA paraophthalmic aneurysms Care per IR / Neurology - cleared for d/c home by IR - to f/u w/ Dr. Estanislado Pandy 2 weeks post d/c   New onset seizure post-procedure Neuro suggests continuing AED (Keppra 1g BID) - to f/u w/ outpt Neurologist   2 Punctate L parietal lobe CVAs post-procedure Noted on MRI - continue with aspirin and Brilinta - felt to be embolic related to procedure per Neuro   Acute hypercapnic respiratory failure  Due to seizure - resolved   Hypertension BP currently well controlled   DVT prophylaxis: Lovenox  Code Status: FULL CODE Family Communication: no family present at time of exam  Disposition Plan: d/c home in AM if BP remains stable / pt remains stable   Consultants:  PCCM Neurology  IR  Antimicrobials:  none   Objective: Blood pressure 136/70, pulse 62, temperature 97.9 F (36.6 C), temperature source Oral, resp. rate 20, height 5\' 11"  (1.803 m), weight 99.8 kg (220 lb 0.3 oz), SpO2 98 %.  Intake/Output Summary (Last 24 hours) at 02/23/2017 1639 Last data filed at 02/23/2017 1236 Gross per 24 hour  Intake 460 ml  Output -  Net 460 ml   Filed Weights   02/21/17 0710 02/22/17 2230  Weight: 98 kg (216 lb) 99.8 kg (220 lb 0.3 oz)    Examination: General: No acute respiratory distress Lungs: Clear to auscultation bilaterally without wheezes or crackles Cardiovascular: Regular rate and rhythm without murmur gallop or rub normal S1 and S2 Abdomen: Nontender, nondistended, soft, bowel sounds positive, no rebound, no ascites, no appreciable mass Extremities: No significant cyanosis, clubbing, or edema bilateral lower extremities  CBC: Recent Labs  Lab 02/21/17 0706 02/22/17 0500 02/22/17 1232 02/23/17 0322  WBC 7.5 8.8 11.9* 7.7  NEUTROABS  --  5.9  --  4.6  HGB 9.8* 8.9* 9.3* 8.5*  HCT 29.6* 27.2* 28.5* 26.3*  MCV 84.3 82.4 83.3 85.7  PLT 253 259 272 409   Basic Metabolic Panel: Recent Labs  Lab 02/21/17 0706 02/21/17 2028 02/22/17 0500 02/23/17 0322  NA 137 138 139 140  K 3.7 4.0 3.7 3.8  CL 106 109 109 110  CO2 22 20* 21* 24  GLUCOSE 96 130* 104* 84  BUN 12 8 9 8   CREATININE 0.99 0.97 1.02* 1.14*  CALCIUM 9.3 8.8* 8.4* 8.7*  MG  --   --  1.6*  --    GFR: Estimated Creatinine Clearance: 67.4 mL/min (A) (by C-G formula based on SCr of 1.14 mg/dL (H)).  Liver Function Tests: Recent Labs  Lab 02/21/17 0706 02/21/17 2028 02/22/17 0500 02/23/17 0322  AST 17 13* 14* 14*  ALT  8* 8* 6* 8*  ALKPHOS 45 40 38 38  BILITOT 0.5 0.4 0.3 0.4  PROT 7.2 6.4* 5.9* 5.9*  ALBUMIN 3.7 3.2* 3.0* 3.0*    Coagulation Profile: Recent Labs  Lab 02/21/17 0706 02/22/17 0500 02/23/17 0322  INR 0.89 0.99 0.92    Recent Results (from the past 240 hour(s))  MRSA PCR Screening     Status: None   Collection Time: 02/21/17  1:11 PM  Result Value Ref Range Status   MRSA by PCR NEGATIVE NEGATIVE Final    Comment:        The GeneXpert MRSA Assay (FDA approved for NASAL specimens only), is one component of a comprehensive MRSA colonization surveillance program. It is not intended to diagnose MRSA infection nor to guide  or monitor treatment for MRSA infections.      Scheduled Meds: . amLODipine  5 mg Oral Daily  . aspirin EC  81 mg Oral Daily  . enoxaparin (LOVENOX) injection  40 mg Subcutaneous Q24H  . levETIRAcetam  1,000 mg Oral BID  . ticagrelor  90 mg Oral BID     LOS: 2 days   Cherene Altes, MD [facility] Office  [phone] Pager - Text Page per Amion as per below:  On-Call/Text Page:      [email or web address]      password [email or web address]  If 7PM-7AM, please contact night-coverage [email or web address] Password [facility] 02/23/2017, 4:39 PM

## 2017-02-23 NOTE — Progress Notes (Signed)
Patient ID: Yolanda Espinoza, female   DOB: [birth year], 62 y.o.   MRN: [id number]    Supervising Physician: Luanne Bras  Patient Status: [facility] - In-pt  Chief Complaint: S/p pipeline stenting and embo   Subjective: Patient is doing well today.  Sitting up in her chair.  Just worked with PT who has no further needs.  She is drinking well and ready to eat.  Allergies: Ace inhibitors and Aspirin  Medications: Prior to Admission medications   Medication Sig Start Date End Date Taking? Authorizing Provider  amLODipine (NORVASC) 10 MG tablet Take 10 mg by mouth daily.   Yes [provider]  aspirin EC 81 MG tablet Take 1 tablet (81 mg total) by mouth daily. 03/11/15  Yes Nahser, Wonda Cheng, MD  Calcium Carb-Cholecalciferol (CALCIUM 600 + D PO) Take 1 tablet by mouth daily.   Yes [provider]  cyclobenzaprine (FLEXERIL) 10 MG tablet Take 1 tablet (10 mg total) by mouth at bedtime as needed for muscle spasms. 01/05/17  Yes Vicie Mutters, PA-C  ferrous sulfate 325 (65 FE) MG tablet Take 325 mg by mouth daily with breakfast.   Yes [provider]  furosemide (LASIX) 40 MG tablet Take 1 tablet (40 mg total) by mouth daily as needed for edema. 10/23/16 02/09/17 Yes Weaver, Scott T, PA-C  magnesium gluconate (MAGONATE) 500 MG tablet Take 500 mg by mouth daily.   Yes [provider]  Multiple Vitamins-Minerals (MULTIVITAMIN WITH MINERALS) tablet Take 1 tablet by mouth daily.   Yes [provider]  olmesartan (BENICAR) 40 MG tablet Take 1 tablet (40 mg total) by mouth daily. 10/16/16  Yes Nahser, Wonda Cheng, MD  Omega-3 Fatty Acids (FISH OIL) 1200 MG CAPS Take 2,400 mg by mouth daily.    Yes [provider]  PREMARIN 0.9 MG tablet TAKE 1 TABLET BY MOUTH ONCE DAILY.(NEED OFFICE VISIT BEFORE FURTHER REFILLS). Patient taking differently: TAKE 1 TABLET (0.9mg ) BY MOUTH ONCE DAILY. 01/28/17  Yes Unk Pinto, MD  ticagrelor (BRILINTA) 90 MG TABS tablet  Take 90 mg by mouth 2 (two) times daily.    Yes [provider]  vitamin B-12 (CYANOCOBALAMIN) 1000 MCG tablet Take 1,000 mcg by mouth daily.   Yes [provider]    Vital Signs: BP 136/79 (BP Location: Right Arm)   Pulse 63   Temp (!) 97.5 F (36.4 C) (Oral)   Resp 20   Ht 5\' 11"  (1.803 m)   Wt 220 lb 0.3 oz (99.8 kg)   SpO2 100%   BMI 30.69 kg/m   Physical Exam: Neuro: intact with no appreciable deficits Skin: R CFA site is c/d/i  Imaging: Ct Head Wo Contrast  Result Date: 02/21/2017 CLINICAL DATA:  Witnessed seizure. Earlier today, placement of pipeline flow diverter with endovascular coiling of 2 LEFT ICA aneurysms. EXAM: CT HEAD WITHOUT CONTRAST TECHNIQUE: Contiguous axial images were obtained from the base of the skull through the vertex without intravenous contrast. COMPARISON:  MR brain 11/08/2015. Cerebral angiography, earlier today FINDINGS: Brain: No acute stroke, acute hemorrhage, mass lesion, hydrocephalus, or extra-axial fluid. Normal cerebral volume. Small focus of hypoattenuation in the LEFT frontal subcortical white matter appears unchanged from prior MR, when technique differences are considered (series 3, image 18, for instance), consistent with small vessel disease. No subarachnoid hemorrhage is evident. Patient has undergone placement of pipeline device on the RIGHT and most recently on the LEFT. There are two endovascular coil masses related to paraophthalmic aneurysms on the LEFT.  These appear uncomplicated. Vascular: No signs of large vessel occlusion. Skull: Normal. Negative for fracture or focal lesion. Sinuses/Orbits: No acute finding. Other: None. IMPRESSION: Unremarkable appearing CT of the head post angiography. No intracranial hemorrhage is evident. No areas are identified concerning for acute infarction. Coils related to endovascular aneurysm treatment and BILATERAL ICA pipeline devices appear appropriately positioned. Electronically Signed    By: Staci Righter M.D.   On: 02/21/2017 15:46   Mr Brain Wo Contrast  Result Date: 02/21/2017 CLINICAL DATA:  62 y/o  F; witnessed seizure. EXAM: MRI HEAD WITHOUT CONTRAST TECHNIQUE: Multiplanar, multiecho pulse sequences of the brain and surrounding structures were obtained without intravenous contrast. COMPARISON:  02/21/2017 CT head.  11/08/2015 MRI of the head. FINDINGS: Brain: Punctate focus of reduced diffusion within the left parietal cortex (series 3, image 39) possible additional punctate focus within left insula (series 3, image 23) compatible with acute/early subacute infarction. No associated hemorrhage or mass effect. Severalnonspecific foci of T2 FLAIR hyperintense signal abnormality in subcortical and periventricular white matter are compatible withmildchronic microvascular ischemic changes for age. Mildbrain parenchymal volume loss. Foci of T2 FLAIR hyperintensity correspond to foci of hypoattenuation on same-day CT of head and are stable from prior MRI. No abnormal susceptibility hypointensity to indicate intracranial hemorrhage. No focal mass effect. No extra-axial collection, hydrocephalus, or effacement of basilar cisterns. No gross structural abnormality of the brain. Hippocampi are symmetric in size and signal. No disorder cortical formation, dysplasia, or heterotopia identified. Vascular: Susceptibility artifact is present from left ICA aneurysm coil mass. Skull and upper cervical spine: Normal marrow signal. Sinuses/Orbits: Paranasal sinus mucosal thickening with fluid levels in the ethmoid and sphenoid sinuses as well as debris in the nasopharynx likely due to intubation. No abnormal signal of mastoid air cells. Orbits are unremarkable. Other: None. IMPRESSION: 1. Punctate acute/early subacute cortical infarct in left parietal lobe and possible additional punctate infarct in left insula. No associated hemorrhage or mass effect. 2. Stable mild chronic microvascular ischemic changes and  mild parenchymal volume loss of the brain. 3. No structural cause of seizure identified. These results will be called to the ordering clinician or representative by the Radiologist Assistant, and communication documented in the PACS or zVision Dashboard. Electronically Signed   By: Kristine Garbe M.D.   On: 02/21/2017 22:41   Dg Chest Port 1 View  Result Date: 02/22/2017 CLINICAL DATA:  ett,HTN, heart murmur, CVA, PVD, EXAM: PORTABLE CHEST 1 VIEW COMPARISON:  02/21/2017 FINDINGS: Endotracheal tube 4.5 cm from carina. Normal cardiac silhouette. Lungs are clear. No pneumothorax. IMPRESSION: Intubation without complication. Electronically Signed   By: Suzy Bouchard M.D.   On: 02/22/2017 08:52   Dg Chest Port 1 View  Result Date: 02/21/2017 CLINICAL DATA:  Check endotracheal tube placement EXAM: PORTABLE CHEST 1 VIEW COMPARISON:  None. FINDINGS: Cardiac shadow is mildly enlarged. Endotracheal tube is noted 1.7 cm above the carina. Lungs are well aerated bilaterally. No focal infiltrate is seen. No bony abnormality is noted. IMPRESSION: Endotracheal tube as described.  No acute abnormality noted. Electronically Signed   By: Inez Catalina M.D.   On: 02/21/2017 15:54    Labs:  CBC: Recent Labs    02/21/17 0706 02/22/17 0500 02/22/17 1232 02/23/17 0322  WBC 7.5 8.8 11.9* 7.7  HGB 9.8* 8.9* 9.3* 8.5*  HCT 29.6* 27.2* 28.5* 26.3*  PLT 253 259 272 232    COAGS: Recent Labs    09/04/16 0629 10/20/16 1620 02/14/17 1509 02/21/17 0706 02/22/17 0500 02/23/17 3244  INR 0.90 0.93 0.92 0.89 0.99 0.92  APTT 32 31 32  --  29  --     BMP: Recent Labs    02/21/17 0706 02/21/17 2028 02/22/17 0500 02/23/17 0322  NA 137 138 139 140  K 3.7 4.0 3.7 3.8  CL 106 109 109 110  CO2 22 20* 21* 24  GLUCOSE 96 130* 104* 84  BUN 12 8 9 8   CALCIUM 9.3 8.8* 8.4* 8.7*  CREATININE 0.99 0.97 1.02* 1.14*  GFRNONAA >60 >60 58* 51*  GFRAA >60 >60 >60 59*    LIVER FUNCTION TESTS: Recent Labs     02/21/17 0706 02/21/17 2028 02/22/17 0500 02/23/17 0322  BILITOT 0.5 0.4 0.3 0.4  AST 17 13* 14* 14*  ALT 8* 8* 6* 8*  ALKPHOS 45 40 38 38  PROT 7.2 6.4* 5.9* 5.9*  ALBUMIN 3.7 3.2* 3.0* 3.0*    Assessment and Plan: 1. S/p placement  of a pipeline flow diverter across x2 Lt ICA   para ophthalmic aneurysms. 2. Seizure post procedure 3. CVA post procedure  Patient is doing very well given the above.  She has no PT needs.  She is hungry and drinking well. She is on her Brilinta 90mg  daily Stable from our standpoint for DC with follow up with Dr. Estanislado Pandy in 2 weeks as an outpatient.  Electronically Signed: Henreitta Cea 02/23/2017, 10:03 AM   I spent a total of 15 Minutes at the the patient's bedside AND on the patient's hospital floor or unit, greater than 50% of which was counseling/coordinating care for aneurysm

## 2017-02-23 NOTE — Progress Notes (Signed)
Physical Therapy Treatment Patient Details Name: Yolanda Espinoza MRN: [id number] DOB: [birth year] Today's Date: 02/23/2017    History of Present Illness Patient is a 62 year old African-American female with past medical history of ICA aneurysm on the left side she had history of hypertension heart murmur CVA peripheral vascular disease above found to have left ICA aneurysm she came in for some pipeline stent after the stent she was extubated sent to the ICU where she had severe altered mental status w/ seizure and MRI shows punctate acute left parietal lobe infarct.    PT Comments    Patient progressing well towards PT goals. Scored 23/24 on DGI indicating pt is decreased risk for falls. Tolerated higher level balance challenges without deviations in gait or LOB as well as stair training with supervision for safety. Pt feeling much better and eager to return home. Pt functioning close to baseline with only mild balance deficits noted. HR ranging from 80-110 bpm. Will continue to follow while in hospital.    Follow Up Recommendations  No PT follow up;Supervision - Intermittent     Equipment Recommendations  None recommended by PT    Recommendations for Other Services       Precautions / Restrictions Precautions Precautions: Fall Restrictions Weight Bearing Restrictions: No    Mobility  Bed Mobility               General bed mobility comments: Sitting in chair upon PT arrival.   Transfers Overall transfer level: Needs assistance Equipment used: None Transfers: Sit to/from Stand Sit to Stand: Supervision         General transfer comment: Supervision for safety. Stood from Youth worker.   Ambulation/Gait Ambulation/Gait assistance: Supervision Ambulation Distance (Feet): 300 Feet Assistive device: None Gait Pattern/deviations: Step-through pattern;Decreased stride length   Gait velocity interpretation: at or above normal speed for age/gender General Gait Details: Steady  gait, no evidence of LOB- tolerated higher level balance challenges. See balance section.    Stairs Stairs: Yes   Stair Management: Alternating pattern;Two rails Number of Stairs: 3(+ 2 steps x2 bouts) General stair comments: Slow and steady technique.   Wheelchair Mobility    Modified Rankin (Stroke Patients Only) Modified Rankin (Stroke Patients Only) Pre-Morbid Rankin Score: No symptoms Modified Rankin: Moderate disability     Balance Overall balance assessment: Needs assistance Sitting-balance support: Feet supported Sitting balance-Leahy Scale: Good     Standing balance support: During functional activity Standing balance-Leahy Scale: Good Standing balance comment: Able to go through bag and reach outside BoS without difficulty or LOB.             High level balance activites: Backward walking;Direction changes;Sudden stops;Turns High Level Balance Comments: Tolerated above with no deviations in gait or LOB. Standardized Balance Assessment Standardized Balance Assessment : Dynamic Gait Index   Dynamic Gait Index Level Surface: Normal Change in Gait Speed: Normal Gait with Horizontal Head Turns: Normal Gait with Vertical Head Turns: Normal Gait and Pivot Turn: Normal Step Over Obstacle: Normal Step Around Obstacles: Normal Steps: Mild Impairment Total Score: 23      Cognition Arousal/Alertness: Awake/alert Behavior During Therapy: WFL for tasks assessed/performed Overall Cognitive Status: Within Functional Limits for tasks assessed                                        Exercises      General Comments General comments (skin integrity,  edema, etc.): HR ranging from 80-110 bpm.      Pertinent Vitals/Pain Pain Assessment: Faces Faces Pain Scale: Hurts a little bit Pain Location: stomach Pain Descriptors / Indicators: Aching Pain Intervention(s): Monitored during session    Home Living                      Prior  Function            PT Goals (current goals can now be found in the care plan section) Progress towards PT goals: Progressing toward goals    Frequency    Min 4X/week      PT Plan Current plan remains appropriate    Co-evaluation              AM-PAC PT "6 Clicks" Daily Activity  Outcome Measure  Difficulty turning over in bed (including adjusting bedclothes, sheets and blankets)?: None Difficulty moving from lying on back to sitting on the side of the bed? : None Difficulty sitting down on and standing up from a chair with arms (e.g., wheelchair, bedside commode, etc,.)?: None Help needed moving to and from a bed to chair (including a wheelchair)?: None Help needed walking in hospital room?: None Help needed climbing 3-5 steps with a railing? : A Little 6 Click Score: 23    End of Session Equipment Utilized During Treatment: Gait belt Activity Tolerance: Patient tolerated treatment well Patient left: in chair;with call bell/phone within reach;with chair alarm set Nurse Communication: Mobility status PT Visit Diagnosis: Other abnormalities of gait and mobility (R26.89)     Time: 0223-3612 PT Time Calculation (min) (ACUTE ONLY): 14 min  Charges:  $Neuromuscular Re-education: 8-22 mins                    G Codes:       Wray Kearns, PT, DPT [phone]     Yolanda Espinoza 02/23/2017, 8:55 AM

## 2017-02-23 NOTE — Progress Notes (Signed)
Occupational Therapy Evaluation Patient Details Name: Yolanda Espinoza MRN: [id number] DOB: [birth year] Today's Date: 02/23/2017    History of Present Illness Patient is a 62 year old African-American female with past medical history of ICA aneurysm on the left side she had history of hypertension heart murmur CVA peripheral vascular disease above found to have left ICA aneurysm she came in for some pipeline stent after the stent she was extubated sent to the ICU where she had severe altered mental status w/ seizure and MRI shows punctate acute left parietal lobe infarct.   Clinical Impression   PTA, pt lived at home with her husband and was independent with ADL and mobility. Pt is her husband's caregiver (states he has dementia) and she takes the bus for transportation and is responsible for her medication and financial management. Pt assessed using the [facility] Cognitive Assessment) and  scored a 15/30, which is considered impaired (pt is a HS graduate). Pt demonstrated difficulty with delayed recall, visuospatial tasks and attention. Pt states she normally does not have difficulty with "subtracting" and can "remember things better". Discusseded recommendation for her son to supervise her medication and financial management. Recommend follow up with OT at the neuro outpt center to facilitate return to PLOF with IADL tasks. Pt agreed. CM notifed. All further OT to be addressed in outpt setting.     Follow Up Recommendations  Outpatient OT;Supervision - Intermittent    Equipment Recommendations  None recommended by OT    Recommendations for Other Services       Precautions / Restrictions Precautions Precautions: None Restrictions Weight Bearing Restrictions: No      Mobility Bed Mobility Overal bed mobility: Modified Independent                Transfers Overall transfer level: Modified independent                    Balance Overall balance assessment: Needs  assistance Sitting-balance support: Feet supported Sitting balance-Leahy Scale: Good     Standing balance support: During functional activity Standing balance-Leahy Scale: Good                             ADL either performed or assessed with clinical judgement   ADL                                         General ADL Comments: overall modified independent with basic ADL tasks; recommend S with IADL tasks and financial and medication management     Vision Baseline Vision/History: Wears glasses Wears Glasses: Reading only Patient Visual Report: No change from baseline       Perception     Praxis      Pertinent Vitals/Pain Pain Assessment: No/denies pain     Hand Dominance Right   Extremity/Trunk Assessment Upper Extremity Assessment Upper Extremity Assessment: Overall WFL for tasks assessed   Lower Extremity Assessment Lower Extremity Assessment: Overall WFL for tasks assessed       Communication Communication Communication: No difficulties   Cognition Arousal/Alertness: Awake/alert Behavior During Therapy: WFL for tasks assessed/performed Overall Cognitive Status: Impaired/Different from baseline Area of Impairment: Attention;Memory;Awareness                   Current Attention Level: Selective Memory: Decreased short-term memory  General Comments: Pt assessed with the [facility] and received a score of 15/30, demosntrating difficulty with visuospatial skills, attention, serial subtraction, language fluency and delayed recall   General Comments       Exercises     Shoulder Instructions      Home Living Family/patient expects to be discharged to:: Private residence Living Arrangements: Spouse/significant other;Children Available Help at Discharge: Family Type of Home: Apartment Home Access: Level entry     Home Layout: One level     Bathroom Shower/Tub: Teacher, early years/pre:  Standard Bathroom Accessibility: Yes How Accessible: Accessible via walker Home Equipment: None   Additional Comments: states spouse unable to assist,. Her husband has dementia and she is his caregiver, sons in room and report plant to take turns helping while pt recovering      Prior Functioning/Environment Level of Independence: Independent                 OT Problem List: Decreased cognition      OT Treatment/Interventions:      OT Goals(Current goals can be found in the care plan section) Acute Rehab OT Goals Patient Stated Goal: To return to independent OT Goal Formulation: All assessment and education complete, DC therapy(continue with outpt OT)  OT Frequency:     Barriers to D/C:            Co-evaluation              AM-PAC PT "6 Clicks" Daily Activity     Outcome Measure Help from another person eating meals?: None Help from another person taking care of personal grooming?: None Help from another person toileting, which includes using toliet, bedpan, or urinal?: None Help from another person bathing (including washing, rinsing, drying)?: None Help from another person to put on and taking off regular upper body clothing?: None Help from another person to put on and taking off regular lower body clothing?: None 6 Click Score: 24   End of Session Nurse Communication: Other (comment)(DC needs)  Activity Tolerance: Patient tolerated treatment well Patient left: in bed;with call bell/phone within reach  OT Visit Diagnosis: Other symptoms and signs involving cognitive function                Time: 0350-0938 OT Time Calculation (min): 23 min Charges:  OT General Charges $OT Visit: 1 Visit OT Evaluation $OT Eval Moderate Complexity: 1 Mod G-Codes:     [place], OT/L  [phone] 02/23/2017  Theola Cuellar,HILLARY 02/23/2017, 5:17 PM

## 2017-02-23 NOTE — Care Management Note (Signed)
Case Management Note  Patient Details  Name: Yolanda Espinoza MRN: [id number] Date of Birth: [birth year]  Subjective/Objective:                    Action/Plan: Plan is for patient to d/c home when medically ready. CM consulted for outpatient therapy. CM met with the patient and she would like to attend [facility]. Orders in Epic and information on the AVS.  Family to provide transportation home when medically ready.   Expected Discharge Date:                  Expected Discharge Plan:  OP Rehab  In-House Referral:     Discharge planning Services  CM Consult  Post Acute Care Choice:    Choice offered to:     DME Arranged:    DME Agency:     HH Arranged:    [place] Agency:     Status of Service:  Completed, signed off  If discussed at [employer] of Stay Meetings, dates discussed:    Additional Comments:  Pollie Friar, RN 02/23/2017, 4:33 PM

## 2017-02-24 LAB — BASIC METABOLIC PANEL
Anion gap: 8 (ref 5–15)
BUN: 11 mg/dL (ref 6–20)
CALCIUM: 8.8 mg/dL — AB (ref 8.9–10.3)
CHLORIDE: 109 mmol/L (ref 101–111)
CO2: 24 mmol/L (ref 22–32)
Creatinine, Ser: 1.01 mg/dL — ABNORMAL HIGH (ref 0.44–1.00)
GFR, EST NON AFRICAN AMERICAN: 59 mL/min — AB (ref >60)
Glucose, Bld: 82 mg/dL (ref 65–99)
Potassium: 3.7 mmol/L (ref 3.5–5.1)
SODIUM: 141 mmol/L (ref 135–145)

## 2017-02-24 LAB — CBC
HCT: 26.2 % — ABNORMAL LOW (ref 36.0–46.0)
Hemoglobin: 8.5 g/dL — ABNORMAL LOW (ref 12.0–15.0)
MCH: 27.5 pg (ref 26.0–34.0)
MCHC: 32.4 g/dL (ref 30.0–36.0)
MCV: 84.8 fL (ref 78.0–100.0)
PLATELETS: 227 10*3/uL (ref 150–400)
RBC: 3.09 MIL/uL — AB (ref 3.87–5.11)
RDW: 15.7 % — ABNORMAL HIGH (ref 11.5–15.5)
WBC: 6.8 10*3/uL (ref 4.0–10.5)

## 2017-02-24 MED ORDER — LEVETIRACETAM 1000 MG PO TABS: 1000.0000 mg | ORAL_TABLET | Freq: Two times a day (BID) | ORAL | 0 refills | Status: DC

## 2017-02-24 NOTE — Progress Notes (Signed)
Patient ready for discharge to home; discharge instructions given and reviewed by [facility]  Patient discharged out via wheelchair.

## 2017-02-24 NOTE — Discharge Summary (Signed)
DISCHARGE SUMMARY  Yolanda Espinoza  MR#: [id number]  DOB:20-Jun-1955  Date of Admission: 02/21/2017 Date of Discharge: 02/24/2017  Attending Physician:Jeffrey Hennie Duos, MD   Patient's AXK:[id number], [name]  Consults: PCCM Neurology  IR  Disposition: D/C home   Follow-up Appts: Follow-up Information    [facility] Follow up.   Specialty:  Rehabilitation Why:  they will contact you for the first visit. Contact information: [address] [place] [id number] [place] [address] [phone]       Luanne Bras, MD Follow up in 2 week(s).   Specialties:  Interventional Radiology, Radiology Why:  pt will hear from scheduler for foolow up date and time with Dr Nicholaus Corolla [phone] if needs Contact information: [address] [place] [address] [phone]        Vicie Mutters, PA-C Follow up in 1 week(s).   Specialty:  Physician Assistant Contact information: [address] [place] [address] [phone]           Discharge Diagnoses: S/ppipeline flow diverter across x2 Lt ICA paraophthalmic aneurysms New onset seizure post-procedure 2 Punctate L parietal lobe CVAs post-procedure Acute hypercapnic respiratory failure  Hypertension  Initial presentation: 62yo female with history of HTN, heart murmur, CVA, and peripheral vascular disease who was found to have a left ICA aneurysm.  She came in for a pipeline stent procedure, and after the stent she was sent to the ICU where she had severe altered mental status and a seizure.  She was unable to protect her airway and had to be intubated.   Hospital Course:  S/ppipeline flow diverter across x2 Lt ICA paraophthalmic aneurysms Care per IR / Neurology - cleared for d/c home by IR - to f/u w/ Dr. Estanislado Pandy 2 weeks post d/c   New onset seizure post-procedure Neuro suggests continuing AED (Keppra 1g  BID) - to f/u w/ outpt Neurologist   2 Punctate L parietal lobe CVAs post-procedure Noted on MRI - continue with aspirin and Brilinta - felt to be embolic related to procedure per Neuro   Acute hypercapnic respiratory failure  Due to seizure - resolved   Hypertension BP reasonably well controlled at time of d/c    Allergies as of 02/24/2017      Reactions   Ace Inhibitors Nausea And Vomiting   Aspirin Nausea And Vomiting   Can take coated Aspirin      Medication List    TAKE these medications   amLODipine 10 MG tablet Commonly known as:  NORVASC Take 10 mg by mouth daily.   aspirin EC 81 MG tablet Take 1 tablet (81 mg total) by mouth daily.   CALCIUM 600 + D PO Take 1 tablet by mouth daily.   cyclobenzaprine 10 MG tablet Commonly known as:  FLEXERIL Take 1 tablet (10 mg total) by mouth at bedtime as needed for muscle spasms.   ferrous sulfate 325 (65 FE) MG tablet Take 325 mg by mouth daily with breakfast.   Fish Oil 1200 MG Caps Take 2,400 mg by mouth daily.   furosemide 40 MG tablet Commonly known as:  LASIX Take 1 tablet (40 mg total) by mouth daily as needed for edema.   levETIRAcetam 1000 MG tablet Commonly known as:  KEPPRA Take 1 tablet (1,000 mg total) by mouth 2 (two) times daily.   magnesium gluconate 500 MG tablet Commonly known as:  MAGONATE Take 500 mg by mouth daily.   multivitamin with minerals tablet Take  1 tablet by mouth daily.   olmesartan 40 MG tablet Commonly known as:  BENICAR Take 1 tablet (40 mg total) by mouth daily.   PREMARIN 0.9 MG tablet Generic drug:  estrogens (conjugated) TAKE 1 TABLET BY MOUTH ONCE DAILY.(NEED OFFICE VISIT BEFORE FURTHER REFILLS). What changed:  See the new instructions.   ticagrelor 90 MG Tabs tablet Commonly known as:  BRILINTA Take 90 mg by mouth 2 (two) times daily.   vitamin B-12 1000 MCG tablet Commonly known as:  CYANOCOBALAMIN Take 1,000 mcg by mouth daily.       Day of  Discharge BP (!) 148/76 (BP Location: Left Arm)   Pulse 65   Temp 99 F (37.2 C) (Oral)   Resp 18   Ht 5\' 11"  (1.803 m)   Wt 99.8 kg (220 lb 0.3 oz)   SpO2 99%   BMI 30.69 kg/m   Physical Exam: General: No acute respiratory distress Lungs: Clear to auscultation bilaterally without wheezes or crackles Cardiovascular: Regular rate and rhythm without murmur gallop or rub normal S1 and S2 Abdomen: Nontender, nondistended, soft, bowel sounds positive, no rebound, no ascites, no appreciable mass Extremities: No significant cyanosis, clubbing, or edema bilateral lower extremities  Basic Metabolic Panel: Recent Labs  Lab 02/21/17 0706 02/21/17 2028 02/22/17 0500 02/23/17 0322 02/24/17 0600  NA 137 138 139 140 141  K 3.7 4.0 3.7 3.8 3.7  CL 106 109 109 110 109  CO2 22 20* 21* 24 24  GLUCOSE 96 130* 104* 84 82  BUN 12 8 9 8 11   CREATININE 0.99 0.97 1.02* 1.14* 1.01*  CALCIUM 9.3 8.8* 8.4* 8.7* 8.8*  MG  --   --  1.6*  --   --     Liver Function Tests: Recent Labs  Lab 02/21/17 0706 02/21/17 2028 02/22/17 0500 02/23/17 0322  AST 17 13* 14* 14*  ALT 8* 8* 6* 8*  ALKPHOS 45 40 38 38  BILITOT 0.5 0.4 0.3 0.4  PROT 7.2 6.4* 5.9* 5.9*  ALBUMIN 3.7 3.2* 3.0* 3.0*   j Coags: Recent Labs  Lab 02/21/17 0706 02/22/17 0500 02/23/17 0322  INR 0.89 0.99 0.92    CBC: Recent Labs  Lab 02/21/17 0706 02/22/17 0500 02/22/17 1232 02/23/17 0322 02/24/17 0600  WBC 7.5 8.8 11.9* 7.7 6.8  NEUTROABS  --  5.9  --  4.6  --   HGB 9.8* 8.9* 9.3* 8.5* 8.5*  HCT 29.6* 27.2* 28.5* 26.3* 26.2*  MCV 84.3 82.4 83.3 85.7 84.8  PLT 253 259 272 232 227    BNP (last 3 results) Recent Labs    03/14/16 1602  BNP 168.7*     CBG: Recent Labs  Lab 02/21/17 1249  GLUCAP 100*    Recent Results (from the past 240 hour(s))  MRSA PCR Screening     Status: None   Collection Time: 02/21/17  1:11 PM  Result Value Ref Range Status   MRSA by PCR NEGATIVE NEGATIVE Final    Comment:         The GeneXpert MRSA Assay (FDA approved for NASAL specimens only), is one component of a comprehensive MRSA colonization surveillance program. It is not intended to diagnose MRSA infection nor to guide or monitor treatment for MRSA infections.      Time spent in discharge (includes decision making & examination of pt): 30 minutes  02/24/2017, 1:43 PM   Cherene Altes, MD [facility] Office  [phone] Pager [phone]  On-Call/Text Page:  [email or web address]      password [employer]

## 2017-02-24 NOTE — Progress Notes (Signed)
Notified MD of 16 beat run SVT

## 2017-02-24 NOTE — Progress Notes (Signed)
Referring Physician(s): Dr Thereasa Solo  Supervising Physician: Luanne Bras  Patient Status:  [facility] - In-pt  Chief Complaint:  02/21/17 S/P placement  of a pipeline flow diverter across x2 Lt ICA   para ophthalmic aneurysms. Seizure post procedure tx to [facility] then [facility] service  Subjective:  Doing well Up in room today Eating well No complaints  Allergies: Ace inhibitors and Aspirin  Medications: Prior to Admission medications   Medication Sig Start Date End Date Taking? Authorizing Provider  amLODipine (NORVASC) 10 MG tablet Take 10 mg by mouth daily.   Yes [provider]  aspirin EC 81 MG tablet Take 1 tablet (81 mg total) by mouth daily. 03/11/15  Yes Nahser, Wonda Cheng, MD  Calcium Carb-Cholecalciferol (CALCIUM 600 + D PO) Take 1 tablet by mouth daily.   Yes [provider]  cyclobenzaprine (FLEXERIL) 10 MG tablet Take 1 tablet (10 mg total) by mouth at bedtime as needed for muscle spasms. 01/05/17  Yes Vicie Mutters, PA-C  ferrous sulfate 325 (65 FE) MG tablet Take 325 mg by mouth daily with breakfast.   Yes [provider]  furosemide (LASIX) 40 MG tablet Take 1 tablet (40 mg total) by mouth daily as needed for edema. 10/23/16 02/09/17 Yes Weaver, Scott T, PA-C  magnesium gluconate (MAGONATE) 500 MG tablet Take 500 mg by mouth daily.   Yes [provider]  Multiple Vitamins-Minerals (MULTIVITAMIN WITH MINERALS) tablet Take 1 tablet by mouth daily.   Yes [provider]  olmesartan (BENICAR) 40 MG tablet Take 1 tablet (40 mg total) by mouth daily. 10/16/16  Yes Nahser, Wonda Cheng, MD  Omega-3 Fatty Acids (FISH OIL) 1200 MG CAPS Take 2,400 mg by mouth daily.    Yes [provider]  PREMARIN 0.9 MG tablet TAKE 1 TABLET BY MOUTH ONCE DAILY.(NEED OFFICE VISIT BEFORE FURTHER REFILLS). Patient taking differently: TAKE 1 TABLET (0.9mg ) BY MOUTH ONCE DAILY. 01/28/17  Yes Unk Pinto, MD  ticagrelor (BRILINTA) 90 MG TABS tablet  Take 90 mg by mouth 2 (two) times daily.    Yes [provider]  vitamin B-12 (CYANOCOBALAMIN) 1000 MCG tablet Take 1,000 mcg by mouth daily.   Yes [provider]     Vital Signs: BP 136/75 (BP Location: Left Arm)   Pulse 74   Temp 98.7 F (37.1 C) (Oral)   Resp 18   Ht 5\' 11"  (1.803 m)   Wt 220 lb 0.3 oz (99.8 kg)   SpO2 95%   BMI 30.69 kg/m   Physical Exam  Constitutional: She is oriented to person, place, and time.  Face symmetrical Tongue midline  HENT:  Head: Atraumatic.  Eyes: EOM are normal.  Musculoskeletal: Normal range of motion.  FROM Moves all 4s Follows all commands  Neurological: She is alert and oriented to person, place, and time.  Skin: Skin is warm and dry.  Psychiatric: She has a normal mood and affect. Her behavior is normal. Judgment and thought content normal.  Nursing note and vitals reviewed.   Imaging: Ct Head Wo Contrast  Result Date: 02/21/2017 CLINICAL DATA:  Witnessed seizure. Earlier today, placement of pipeline flow diverter with endovascular coiling of 2 LEFT ICA aneurysms. EXAM: CT HEAD WITHOUT CONTRAST TECHNIQUE: Contiguous axial images were obtained from the base of the skull through the vertex without intravenous contrast. COMPARISON:  MR brain 11/08/2015. Cerebral angiography, earlier today FINDINGS: Brain: No acute stroke, acute hemorrhage, mass lesion, hydrocephalus, or extra-axial fluid. Normal cerebral volume. Small focus of  hypoattenuation in the LEFT frontal subcortical white matter appears unchanged from prior MR, when technique differences are considered (series 3, image 18, for instance), consistent with small vessel disease. No subarachnoid hemorrhage is evident. Patient has undergone placement of pipeline device on the RIGHT and most recently on the LEFT. There are two endovascular coil masses related to paraophthalmic aneurysms on the LEFT. These appear uncomplicated. Vascular: No signs of large vessel occlusion.  Skull: Normal. Negative for fracture or focal lesion. Sinuses/Orbits: No acute finding. Other: None. IMPRESSION: Unremarkable appearing CT of the head post angiography. No intracranial hemorrhage is evident. No areas are identified concerning for acute infarction. Coils related to endovascular aneurysm treatment and BILATERAL ICA pipeline devices appear appropriately positioned. Electronically Signed   By: Staci Righter M.D.   On: 02/21/2017 15:46   Mr Brain Wo Contrast  Result Date: 02/21/2017 CLINICAL DATA:  62 y/o  F; witnessed seizure. EXAM: MRI HEAD WITHOUT CONTRAST TECHNIQUE: Multiplanar, multiecho pulse sequences of the brain and surrounding structures were obtained without intravenous contrast. COMPARISON:  02/21/2017 CT head.  11/08/2015 MRI of the head. FINDINGS: Brain: Punctate focus of reduced diffusion within the left parietal cortex (series 3, image 39) possible additional punctate focus within left insula (series 3, image 23) compatible with acute/early subacute infarction. No associated hemorrhage or mass effect. Severalnonspecific foci of T2 FLAIR hyperintense signal abnormality in subcortical and periventricular white matter are compatible withmildchronic microvascular ischemic changes for age. Mildbrain parenchymal volume loss. Foci of T2 FLAIR hyperintensity correspond to foci of hypoattenuation on same-day CT of head and are stable from prior MRI. No abnormal susceptibility hypointensity to indicate intracranial hemorrhage. No focal mass effect. No extra-axial collection, hydrocephalus, or effacement of basilar cisterns. No gross structural abnormality of the brain. Hippocampi are symmetric in size and signal. No disorder cortical formation, dysplasia, or heterotopia identified. Vascular: Susceptibility artifact is present from left ICA aneurysm coil mass. Skull and upper cervical spine: Normal marrow signal. Sinuses/Orbits: Paranasal sinus mucosal thickening with fluid levels in the ethmoid  and sphenoid sinuses as well as debris in the nasopharynx likely due to intubation. No abnormal signal of mastoid air cells. Orbits are unremarkable. Other: None. IMPRESSION: 1. Punctate acute/early subacute cortical infarct in left parietal lobe and possible additional punctate infarct in left insula. No associated hemorrhage or mass effect. 2. Stable mild chronic microvascular ischemic changes and mild parenchymal volume loss of the brain. 3. No structural cause of seizure identified. These results will be called to the ordering clinician or representative by the Radiologist Assistant, and communication documented in the PACS or zVision Dashboard. Electronically Signed   By: Kristine Garbe M.D.   On: 02/21/2017 22:41   Dg Chest Port 1 View  Result Date: 02/22/2017 CLINICAL DATA:  ett,HTN, heart murmur, CVA, PVD, EXAM: PORTABLE CHEST 1 VIEW COMPARISON:  02/21/2017 FINDINGS: Endotracheal tube 4.5 cm from carina. Normal cardiac silhouette. Lungs are clear. No pneumothorax. IMPRESSION: Intubation without complication. Electronically Signed   By: Suzy Bouchard M.D.   On: 02/22/2017 08:52   Dg Chest Port 1 View  Result Date: 02/21/2017 CLINICAL DATA:  Check endotracheal tube placement EXAM: PORTABLE CHEST 1 VIEW COMPARISON:  None. FINDINGS: Cardiac shadow is mildly enlarged. Endotracheal tube is noted 1.7 cm above the carina. Lungs are well aerated bilaterally. No focal infiltrate is seen. No bony abnormality is noted. IMPRESSION: Endotracheal tube as described.  No acute abnormality noted. Electronically Signed   By: Inez Catalina M.D.   On: 02/21/2017 15:54  Labs:  CBC: Recent Labs    02/22/17 0500 02/22/17 1232 02/23/17 0322 02/24/17 0600  WBC 8.8 11.9* 7.7 6.8  HGB 8.9* 9.3* 8.5* 8.5*  HCT 27.2* 28.5* 26.3* 26.2*  PLT 259 272 232 227    COAGS: Recent Labs    09/04/16 0629 10/20/16 1620 02/14/17 1509 02/21/17 0706 02/22/17 0500 02/23/17 0322  INR 0.90 0.93 0.92 0.89  0.99 0.92  APTT 32 31 32  --  29  --     BMP: Recent Labs    02/21/17 2028 02/22/17 0500 02/23/17 0322 02/24/17 0600  NA 138 139 140 141  K 4.0 3.7 3.8 3.7  CL 109 109 110 109  CO2 20* 21* 24 24  GLUCOSE 130* 104* 84 82  BUN 8 9 8 11   CALCIUM 8.8* 8.4* 8.7* 8.8*  CREATININE 0.97 1.02* 1.14* 1.01*  GFRNONAA >60 58* 51* 59*  GFRAA >60 >60 59* >60    LIVER FUNCTION TESTS: Recent Labs    02/21/17 0706 02/21/17 2028 02/22/17 0500 02/23/17 0322  BILITOT 0.5 0.4 0.3 0.4  AST 17 13* 14* 14*  ALT 8* 8* 6* 8*  ALKPHOS 45 40 38 38  PROT 7.2 6.4* 5.9* 5.9*  ALBUMIN 3.7 3.2* 3.0* 3.0*    Assessment and Plan:  L ICA aneurysms-- pipeline stent placed in IR 1/30 She will hear from scheduler for follow up with Dr Estanislado Pandy Plan per [facility]  Electronically Signed: Monia Sabal A, PA-C 02/24/2017, 9:46 AM   I spent a total of 15 Minutes at the the patient's bedside AND on the patient's hospital floor or unit, greater than 50% of which was counseling/coordinating care for L ICA aneurysm embolization

## 2017-02-24 NOTE — Discharge Instructions (Signed)
Seizure, Adult  DO NOT DRIVE FOR 6 months, or until cleared to do so by your doctor in a follow up visit.    A seizure is a sudden burst of abnormal electrical activity in the brain. The abnormal activity temporarily interrupts normal brain function, causing a person to experience any of the following:  Involuntary movements.  Changes in awareness or consciousness.  Uncontrollable shaking (convulsions).  Seizures usually last from 30 seconds to 2 minutes. They usually do not cause permanent brain damage unless they are prolonged. What can cause a seizure to happen? Seizures can happen for many reasons including:  A fever.  Low blood sugar.  A medicine.  An illnesses.  A brain injury.  Some people who have a seizure never have another one. People who have repeated seizures have a condition called epilepsy. What are the symptoms of a seizure? Symptoms of a seizure vary greatly from person to person. They include:  Convulsions.  Stiffening of the body.  Involuntary movements of the arms or legs.  Loss of consciousness.  Breathing problems.  Falling suddenly.  Confusion.  Head nodding.  Eye blinking or fluttering.  Lip smacking.  Drooling.  Rapid eye movements.  Grunting.  Loss of bladder control and bowel control.  Staring.  Unresponsiveness.  Some people have symptoms right before a seizure happens (aura) and right after a seizure happens. Symptoms of an aura include:  Fear or anxiety.  Nausea.  Feeling like the room is spinning (vertigo).  A feeling of having seen or heard something before (deja vu).  Odd tastes or smells.  Changes in vision, such as seeing flashing lights or spots.  Symptoms that may follow a seizure include:  Confusion.  Sleepiness.  Headache.  Weakness of one side of the body.  Follow these instructions at home: Medicines   Take over-the-counter and prescription medicines only as told by your health care  provider.  Avoid any substances that may prevent your medicine from working properly, such as alcohol. Activity  Do not drive, swim, or do any other activities that would be dangerous if you had another seizure. Wait until your health care provider approves.  If you live in the [place] check with your local DMV (department of motor vehicles) to find out about the local driving laws. Each state has specific rules about when you can legally return to driving.  Get enough rest. Lack of sleep can make seizures more likely to occur. Educating others Teach friends and family what to do if you have a seizure. They should:  Lay you on the ground to prevent a fall.  Cushion your head and body.  Loosen any tight clothing around your neck.  Turn you on your side. If vomiting occurs, this helps keep your airway clear.  Stay with you until you recover.  Not hold you down. Holding you down will not stop the seizure.  Not put anything in your mouth.  Know whether or not you need emergency care.  General instructions  Contact your health care provider each time you have a seizure.  Avoid anything that has ever triggered a seizure for you.  Keep a seizure diary. Record what you remember about each seizure, especially anything that might have triggered the seizure.  Keep all follow-up visits as told by your health care provider. This is important. Contact a health care provider if:  You have another seizure.  You have seizures more often.  Your seizure symptoms change.  You continue to  have seizures with treatment.  You have symptoms of an infection or illness. They might increase your risk of having a seizure. Get help right away if:  You have a seizure: ? That lasts longer than 5 minutes. ? That is different than previous seizures. ? That leaves you unable to speak or use a part of your body. ? That makes it harder to breathe. ? After a head injury.  You have: ? Multiple  seizures in a row. ? Confusion or a severe headache right after a seizure.  You are having seizures more often.  You do not wake up immediately after a seizure.  You injure yourself during a seizure. These symptoms may represent a serious problem that is an emergency. Do not wait to see if the symptoms will go away. Get medical help right away. Call your local emergency services (911 in the [place] Do not drive yourself to the hospital. This information is not intended to replace advice given to you by your health care provider. Make sure you discuss any questions you have with your health care provider. Document Released: 01/07/2000 Document Revised: 09/05/2015 Document Reviewed: 08/13/2015 Elsevier Interactive Patient Education  [employer].

## 2017-02-26 ENCOUNTER — Encounter (HOSPITAL_COMMUNITY): Payer: Self-pay | Admitting: Interventional Radiology

## 2017-02-28 ENCOUNTER — Ambulatory Visit: Payer: BLUE CROSS/BLUE SHIELD | Admitting: Physician Assistant

## 2017-02-28 ENCOUNTER — Encounter: Payer: Self-pay | Admitting: Physician Assistant

## 2017-02-28 VITALS — BP 122/76 | HR 57 | Temp 97.5°F | Ht 71.0 in | Wt 210.0 lb

## 2017-02-28 DIAGNOSIS — R569 Unspecified convulsions: Secondary | ICD-10-CM | POA: Diagnosis not present

## 2017-02-28 DIAGNOSIS — M25521 Pain in right elbow: Secondary | ICD-10-CM

## 2017-02-28 DIAGNOSIS — D649 Anemia, unspecified: Secondary | ICD-10-CM

## 2017-02-28 DIAGNOSIS — Z79899 Other long term (current) drug therapy: Secondary | ICD-10-CM

## 2017-02-28 DIAGNOSIS — I671 Cerebral aneurysm, nonruptured: Secondary | ICD-10-CM

## 2017-02-28 DIAGNOSIS — J029 Acute pharyngitis, unspecified: Secondary | ICD-10-CM | POA: Diagnosis not present

## 2017-02-28 NOTE — Progress Notes (Signed)
Subjective:    Patient ID: Yolanda Espinoza, female    DOB: March 08, 1955, 62 y.o.   MRN: [id number]  HPI 63 y.o. AAF presents for hospital follow up. She was seen for left ICA aneursym for a pipeline stent but had a seizure and AMS after her procedure,  Subsequently she was intubated for airway protection.   She will follow up with Dr. Corena Pilgrim in 2 weeks. She is on Keppra 1g BID, states no AEs other than fatigue. She is still on her brilinta and ASA. She is suppose to go back to work tomorrow, she serves residence at an NH.  She is doing outpatient rehab, they will call her. She is not driving. She has right arm pain at her shoulder and down her arm, no CP, SOB. Worse with positions. She also states her throat is sore from intubation. No fever, chills.   She is suppose to be on CPAP, needs tubing.  She had anemia in the hospital, she has been following with Dr. Collene Mares.  Lab Results  Component Value Date   WBC 6.8 02/24/2017   HGB 8.5 (L) 02/24/2017   HCT 26.2 (L) 02/24/2017   MCV 84.8 02/24/2017   PLT 227 02/24/2017    Blood pressure 122/76, pulse (!) 57, temperature (!) 97.5 F (36.4 C), height 5\' 11"  (1.803 m), weight 210 lb (95.3 kg), SpO2 97 %.  Medications Current Outpatient Medications on File Prior to Visit  Medication Sig  . amLODipine (NORVASC) 10 MG tablet Take 10 mg by mouth daily.  Marland Kitchen aspirin EC 81 MG tablet Take 1 tablet (81 mg total) by mouth daily.  . Calcium Carb-Cholecalciferol (CALCIUM 600 + D PO) Take 1 tablet by mouth daily.  . cyclobenzaprine (FLEXERIL) 10 MG tablet Take 1 tablet (10 mg total) by mouth at bedtime as needed for muscle spasms.  . ferrous sulfate 325 (65 FE) MG tablet Take 325 mg by mouth daily with breakfast.  . levETIRAcetam (KEPPRA) 1000 MG tablet Take 1 tablet (1,000 mg total) by mouth 2 (two) times daily.  . magnesium gluconate (MAGONATE) 500 MG tablet Take 500 mg by mouth daily.  . Multiple Vitamins-Minerals (MULTIVITAMIN WITH MINERALS) tablet  Take 1 tablet by mouth daily.  Marland Kitchen olmesartan (BENICAR) 40 MG tablet Take 1 tablet (40 mg total) by mouth daily.  . Omega-3 Fatty Acids (FISH OIL) 1200 MG CAPS Take 2,400 mg by mouth daily.   Marland Kitchen PREMARIN 0.9 MG tablet TAKE 1 TABLET BY MOUTH ONCE DAILY.(NEED OFFICE VISIT BEFORE FURTHER REFILLS). (Patient taking differently: TAKE 1 TABLET (0.9mg ) BY MOUTH ONCE DAILY.)  . ticagrelor (BRILINTA) 90 MG TABS tablet Take 90 mg by mouth 2 (two) times daily.   . vitamin B-12 (CYANOCOBALAMIN) 1000 MCG tablet Take 1,000 mcg by mouth daily.  . furosemide (LASIX) 40 MG tablet Take 1 tablet (40 mg total) by mouth daily as needed for edema.   No current facility-administered medications on file prior to visit.     Problem list She has Essential hypertension; Mitral regurgitation; Chronic diastolic heart failure ([place]); OSA on CPAP; Vitamin D deficiency; Hyperlipidemia; Brain aneurysm; Prediabetes; Medication management; Obesity; Gout; and Seizure ([place]) on their problem list.   Review of Systems See HPI    Objective:   Physical Exam  Constitutional: She is oriented to person, place, and time. She appears well-developed and well-nourished.  HENT:  Head: Normocephalic and atraumatic.  Slight ecchymosis bilateral post pharynx, good air way.   Eyes: Conjunctivae are normal.  Neck: Normal  range of motion. Neck supple.  Cardiovascular: Normal rate and regular rhythm.  Pulmonary/Chest: Effort normal and breath sounds normal.  Abdominal: Soft. Bowel sounds are normal. There is no tenderness.  Musculoskeletal: Normal range of motion. She exhibits tenderness (right elbow at distal biceps/lateral epicondylle. ).  Lymphadenopathy:    She has no cervical adenopathy.  Neurological: She is alert and oriented to person, place, and time. Coordination normal.  tongue with deviation to the left, her right lower lip is slightly larger than left but she has good nasal folds and no obvious facial dropping. Good sensation  bilateral face, normal cranial nerves otherwise, and good strength bilateral UE and LE.   Skin: Skin is warm and dry. No rash noted.       Assessment & Plan:   Brain aneurysm Follow up with IR  Seizure ([place]) Continue keppra, will check level, will refer to neuro Patient is not drivng at this time -     BASIC METABOLIC PANEL WITH GFR -     Hepatic function panel -     Ambulatory referral to Neurology  Anemia, unspecified type Has been following with Dr. Collene Mares Will get back on anemia -     CBC with Differential/Platelet  Sore throat From intubation good airway, cold liquids, soft foods and monitor  Right elbow pain Ice and exercises given  Medication management -     Levetiracetam level    Future Appointments  Date Time Provider Ferdinand  03/07/2017  2:00 PM [facility] 2 [facility]  04/05/2017  4:30 PM Vicie Mutters, PA-C [facility] None  01/21/2018 10:00 AM Vicie Mutters, PA-C [facility] None

## 2017-02-28 NOTE — Patient Instructions (Signed)
Do soft foods, cold foods Ice your right elbow and do the exercise below Will check anemia Will refer to neuro    [facility] Elbow Rehab Ask your health care provider which exercises are safe for you. Do exercises exactly as told by your health care provider and adjust them as directed. It is normal to feel mild stretching, pulling, tightness, or discomfort as you do these exercises, but you should stop right away if you feel sudden pain or your pain gets worse. Do not begin these exercises until told by your health care provider. Stretching and range of motion exercises These exercises warm up your muscles and joints and improve the movement and flexibility of your elbow. These exercises also help to relieve pain, numbness, and tingling. Exercise A: Wrist extensor stretch 1. Extend your left / right elbow with your fingers pointing down. 2. Gently pull the palm of your left / right hand toward you until you feel a gentle stretch on the top of your forearm. 3. To increase the stretch, push your left / right hand toward the outer edge or pinkie side of your forearm. 4. Hold this position for __________ seconds. Repeat __________ times. Complete this exercise __________ times a day. If directed by your health care provider, repeat this stretch except do it with a bent elbow this time. Exercise B: Wrist flexor stretch  1. Extend your left / right elbow and turn your palm upward. 2. Gently pull your left / right palm and fingertips back so your wrist extends and your fingers point more toward the ground. 3. You should feel a gentle stretch on the inside of your forearm. 4. Hold this position for __________ seconds. Repeat __________ times. Complete this exercise __________ times a day. If directed by your health care provider, repeat this stretch except do it with a bent elbow this time. Strengthening exercises These exercises build strength and endurance in your elbow. Endurance is the ability to  use your muscles for a long time, even after they get tired. Exercise C: Wrist extensors  1. Sit with your left / right forearm palm-down and fully supported on a table or countertop. Your elbow should be resting below the height of your shoulder. 2. Let your left / right wrist extend over the edge of the surface. 3. Loosely hold a __________ weight or a piece of rubber exercise band or tubing in your left / right hand. Slowly curl your left / right hand up toward your forearm. If you are using band or tubing, hold the band or tubing in place with your other hand to provide resistance. 4. Hold this position for __________ seconds. 5. Slowly return to the starting position. Repeat __________ times. Complete this exercise __________ times a day. Exercise D: Radial deviators  1. Stand with a __________ weight in your left / righthand. Or, sit while holding a rubber exercise band or tubing with your other arm supported on a table or countertop. Position your hand so your thumb is on top. 2. Raise your hand upward in front of you so your thumb travels toward your forearm, or pull up on the rubber tubing. 3. Hold this position for __________ seconds. 4. Slowly return to the starting position. Repeat __________ times. Complete this exercise __________ times a day. Exercise E: Eccentric wrist extensors 1. Sit with your left / right forearm palm-down and fully supported on a table or countertop. Your elbow should be resting below the height of your shoulder. 2. If told  by your health care provider, hold a __________ weight in your hand. 3. Let your left / right wrist extend over the edge of the surface. 4. Use your other hand to lift up your left / right hand toward your forearm. Keep your forearm on the table. 5. Using only the muscles in your left / right hand, slowly lower your hand back down to the starting position. Repeat __________ times. Complete this exercise __________ times a day. This  information is not intended to replace advice given to you by your health care provider. Make sure you discuss any questions you have with your health care provider. Document Released: 01/09/2005 Document Revised: 09/15/2015 Document Reviewed: 10/08/2014 Elsevier Interactive Patient Education  2018 [employer].   Biceps Tendon Tendinitis (Distal) Rehab Ask your health care provider which exercises are safe for you. Do exercises exactly as told by your health care provider and adjust them as directed. It is normal to feel mild stretching, pulling, tightness, or discomfort as you do these exercises, but you should stop right away if you feel sudden pain or your pain gets worse.Do not begin these exercises until told by your health care provider. Stretching and range of motion exercises These exercises warm up your muscles and joints and improve the movement and flexibility of your arm. These exercises can also help to relieve pain and stiffness. Exercise A: Supination, active-assisted 5. Stand or sit with your left / right elbow bent to an "L" shape (90 degrees). 6. Rotate your palm up until you cannot rotate it anymore. Then, use your other hand to help rotate your left / right forearm more. 7. Hold this position for __________ seconds. 8. Slowly return to the starting position. Repeat __________ times. Complete this exercise __________ times a day. Exercise B: Pronation, active-assisted  5. Stand or sit with your left / right elbow bent to an "L" shape (90 degrees). 6. Rotate your left / right palm down until you cannot rotate it anymore. Then, use your other hand to help rotate your left / right forearm more. 7. Hold this position for __________ seconds. 8. Slowly return to the starting position. Repeat __________ times. Complete this exercise __________ times a day. Strengthening exercises These exercises build strength and endurance in your arm and shoulder. Endurance is the ability to  use your muscles for a long time, even after your muscles get tired. Exercise C: Supination  6. Sit with your left / right forearm supported on a table. Your elbow should be at waist height. 7. Rest your hand over the edge of the table, palm-down. 8. Gently grasp a lightweight hammer near the head. As this exercise gets easier for you, try holding the hammer farther down the handle. 9. Without moving your elbow, slowly rotate your palm up so it faces the ceiling. 10. Hold this position for__________ seconds. 11. Slowly return to the starting position. Repeat __________ times. Complete this exercise __________ times a day. Exercise D: Pronation  5. Sit with your left / right forearm supported on a table. Your elbow should be at waist height. 6. Rest your hand over the edge of the table, palm-up. 7. Gently grasp a lightweight hammer near the head. As this exercise gets easier for you, try holding the hammer farther down the handle. 8. Without moving your elbow, slowly rotate your palm down. 9. Hold this position for __________ seconds. 10. Slowly return to the starting position. Repeat __________ times. Complete this exercise __________ times a day. Exercise  E: Elbow flexion, supinated  6. Sit on a stable chair without armrests, or stand. 7. If directed, hold a __________ weight in your left / right hand, or hold an exercise band with both hands. Your palms should face up toward the ceiling at the starting position. 8. Bend your left / right elbow and move your hand up toward your shoulder. Keep your other arm straight down, in the starting position. 9. Slowly return to the starting position. Repeat __________ times. Complete this exercise __________ times a day. Exercise F: Elbow extension  1. Lie on your back. 2. Hold a __________ weight in your left / right hand. 3. Bend your left / right elbow to an "L" shape (90 degrees) so your elbow is pointed up to the ceiling and the weight is  overhead. 4. Straighten your elbow, raising your hand toward the ceiling. Use your other hand to support your left / right upper arm and to keep it still. 5. Slowly return to the starting position. Repeat __________ times. Complete this exercise __________ times a day. This information is not intended to replace advice given to you by your health care provider. Make sure you discuss any questions you have with your health care provider. Document Released: 01/09/2005 Document Revised: 09/16/2015 Document Reviewed: 12/18/2014 Elsevier Interactive Patient Education  [employer].

## 2017-03-03 LAB — HEPATIC FUNCTION PANEL
AG RATIO: 1.2 (calc) (ref 1.0–2.5)
ALBUMIN MSPROF: 4 g/dL (ref 3.6–5.1)
ALT: 6 U/L (ref 6–29)
AST: 12 U/L (ref 10–35)
Alkaline phosphatase (APISO): 50 U/L (ref 33–130)
Bilirubin, Direct: 0.1 mg/dL (ref 0.0–0.2)
GLOBULIN: 3.3 g/dL (ref 1.9–3.7)
Indirect Bilirubin: 0.1 mg/dL (calc) — ABNORMAL LOW (ref 0.2–1.2)
TOTAL PROTEIN: 7.3 g/dL (ref 6.1–8.1)
Total Bilirubin: 0.2 mg/dL (ref 0.2–1.2)

## 2017-03-03 LAB — BASIC METABOLIC PANEL WITH GFR
BUN/Creatinine Ratio: 12 (calc) (ref 6–22)
BUN: 13 mg/dL (ref 7–25)
CALCIUM: 9.6 mg/dL (ref 8.6–10.4)
CHLORIDE: 107 mmol/L (ref 98–110)
CO2: 29 mmol/L (ref 20–32)
CREATININE: 1.08 mg/dL — AB (ref 0.50–0.99)
GFR, Est African American: 64 mL/min/{1.73_m2} (ref >=60)
GFR, Est Non African American: 55 mL/min/{1.73_m2} — ABNORMAL LOW (ref >=60)
Glucose, Bld: 80 mg/dL (ref 65–99)
Potassium: 4.6 mmol/L (ref 3.5–5.3)
Sodium: 142 mmol/L (ref 135–146)

## 2017-03-03 LAB — CBC WITH DIFFERENTIAL/PLATELET
BASOS PCT: 0.6 %
Basophils Absolute: 39 cells/uL (ref 0–200)
Eosinophils Absolute: 312 cells/uL (ref 15–500)
Eosinophils Relative: 4.8 %
HCT: 27.7 % — ABNORMAL LOW (ref 35.0–45.0)
HEMOGLOBIN: 8.9 g/dL — AB (ref 11.7–15.5)
LYMPHS ABS: 2327 {cells}/uL (ref 850–3900)
MCH: 26.5 pg — ABNORMAL LOW (ref 27.0–33.0)
MCHC: 32.1 g/dL (ref 32.0–36.0)
MCV: 82.4 fL (ref 80.0–100.0)
MPV: 9.7 fL (ref 7.5–12.5)
Monocytes Relative: 6.2 %
NEUTROS ABS: 3419 {cells}/uL (ref 1500–7800)
Neutrophils Relative %: 52.6 %
PLATELETS: 299 10*3/uL (ref 140–400)
RBC: 3.36 10*6/uL — AB (ref 3.80–5.10)
RDW: 15 % (ref 11.0–15.0)
TOTAL LYMPHOCYTE: 35.8 %
WBC mixed population: 403 cells/uL (ref 200–950)
WBC: 6.5 10*3/uL (ref 3.8–10.8)

## 2017-03-03 LAB — LEVETIRACETAM LEVEL: KEPPRA (LEVETIRACETAM): 59 ug/mL

## 2017-03-05 ENCOUNTER — Other Ambulatory Visit: Payer: Self-pay

## 2017-03-05 DIAGNOSIS — I5032 Chronic diastolic (congestive) heart failure: Secondary | ICD-10-CM

## 2017-03-05 DIAGNOSIS — M545 Low back pain, unspecified: Secondary | ICD-10-CM

## 2017-03-05 MED ORDER — AMLODIPINE BESYLATE 10 MG PO TABS: 10.0000 mg | ORAL_TABLET | Freq: Every day | ORAL | 0 refills | Status: DC

## 2017-03-05 MED ORDER — TICAGRELOR 90 MG PO TABS: 90.0000 mg | ORAL_TABLET | Freq: Two times a day (BID) | ORAL | 1 refills | Status: DC

## 2017-03-05 MED ORDER — ESTROGENS CONJUGATED 0.9 MG PO TABS: ORAL_TABLET | ORAL | 1 refills | Status: DC

## 2017-03-05 MED ORDER — FUROSEMIDE 40 MG PO TABS: 40.0000 mg | ORAL_TABLET | Freq: Every day | ORAL | 0 refills | Status: DC | PRN

## 2017-03-05 MED ORDER — LEVETIRACETAM 1000 MG PO TABS: 1000.0000 mg | ORAL_TABLET | Freq: Two times a day (BID) | ORAL | 0 refills | Status: DC

## 2017-03-05 MED ORDER — CYCLOBENZAPRINE HCL 10 MG PO TABS: 10.0000 mg | ORAL_TABLET | Freq: Every evening | ORAL | 0 refills | Status: DC | PRN

## 2017-03-05 MED ORDER — OLMESARTAN MEDOXOMIL 40 MG PO TABS: 40.0000 mg | ORAL_TABLET | Freq: Every day | ORAL | 0 refills | Status: DC

## 2017-03-07 ENCOUNTER — Ambulatory Visit (HOSPITAL_COMMUNITY)
Admission: RE | Admit: 2017-03-07 | Discharge: 2017-03-07 | Disposition: A | Discharge status: Home / Self Care | Payer: BLUE CROSS/BLUE SHIELD | Source: Ambulatory Visit | Attending: Interventional Radiology | Admitting: Interventional Radiology

## 2017-03-07 ENCOUNTER — Other Ambulatory Visit (HOSPITAL_COMMUNITY): Payer: Self-pay | Admitting: Radiology

## 2017-03-07 ENCOUNTER — Other Ambulatory Visit (HOSPITAL_COMMUNITY): Payer: Self-pay | Admitting: Interventional Radiology

## 2017-03-07 ENCOUNTER — Ambulatory Visit (HOSPITAL_COMMUNITY)
Admission: RE | Admit: 2017-03-07 | Discharge: 2017-03-07 | Disposition: A | Discharge status: Home / Self Care | Payer: BLUE CROSS/BLUE SHIELD | Source: Ambulatory Visit | Attending: Radiology | Admitting: Radiology

## 2017-03-07 DIAGNOSIS — R519 Headache, unspecified: Secondary | ICD-10-CM

## 2017-03-07 DIAGNOSIS — I671 Cerebral aneurysm, nonruptured: Secondary | ICD-10-CM | POA: Insufficient documentation

## 2017-03-07 DIAGNOSIS — G8929 Other chronic pain: Secondary | ICD-10-CM

## 2017-03-07 DIAGNOSIS — G939 Disorder of brain, unspecified: Secondary | ICD-10-CM | POA: Diagnosis not present

## 2017-03-07 DIAGNOSIS — R51 Headache: Secondary | ICD-10-CM

## 2017-03-07 DIAGNOSIS — Z95828 Presence of other vascular implants and grafts: Secondary | ICD-10-CM | POA: Insufficient documentation

## 2017-03-07 DIAGNOSIS — Z1231 Encounter for screening mammogram for malignant neoplasm of breast: Secondary | ICD-10-CM | POA: Diagnosis not present

## 2017-03-07 HISTORY — PX: IR RADIOLOGIST EVAL & MGMT: IMG5224

## 2017-03-07 LAB — PLATELET INHIBITION P2Y12: PLATELET FUNCTION P2Y12: 57 [PRU] — AB (ref 194–418)

## 2017-03-07 NOTE — Progress Notes (Signed)
Left patient voicemail to call

## 2017-03-08 ENCOUNTER — Other Ambulatory Visit: Payer: Self-pay

## 2017-03-08 ENCOUNTER — Ambulatory Visit: Payer: BLUE CROSS/BLUE SHIELD | Attending: Internal Medicine | Admitting: Occupational Therapy

## 2017-03-08 ENCOUNTER — Encounter (HOSPITAL_COMMUNITY): Payer: Self-pay | Admitting: Interventional Radiology

## 2017-03-08 DIAGNOSIS — M6281 Muscle weakness (generalized): Secondary | ICD-10-CM | POA: Insufficient documentation

## 2017-03-08 DIAGNOSIS — M79601 Pain in right arm: Secondary | ICD-10-CM | POA: Insufficient documentation

## 2017-03-08 NOTE — Therapy (Signed)
[facility] [address] [place], [place], [address] Phone: [phone]   Fax:  [phone]  Occupational Therapy Evaluation  Patient Details  Name: Yolanda Espinoza MRN: [id number] Date of Birth: Feb 20, 1955 Referring Provider: Vicie Mutters PA-C   Encounter Date: 03/08/2017  OT End of Session - 03/08/17 1649    Visit Number  1    Number of Visits  9    Date for OT Re-Evaluation  04/05/17    Authorization Type  BC/BS    OT Start Time  1015    OT Stop Time  1100    OT Time Calculation (min)  45 min    Activity Tolerance  Patient tolerated treatment well    Behavior During Therapy  [facility] for tasks assessed/performed       Past Medical History:  Diagnosis Date  . Anemia   . Arthritis   . Chronic diastolic heart failure ([place])   . GERD (gastroesophageal reflux disease)   . Heart murmur   . Hyperlipidemia   . Hypertension may, 1973  . Mitral regurgitation    a. Echo 01/2012 mild LVH, EF 55-65%, Gr 2 DD, mod MR, mild LAE, PASP 36;  b. Echo (02/2013):  EF 55-60%, Gr 1 DD, mild to mod MR, mild LAE (no sig change since prior echo) // c. Echo 10/18: EF 55-60, normal wall motion, grade 2 diastolic dysfunction, trivial AI, mild to moderate MR, mild LAE, trivial PI, PASP 38    . OSA on CPAP    7 yrs  . Peripheral vascular disease ([place])    cerebral aneursym  . Stroke ([place]) 07   no weakness  . Subarachnoid hemorrhage due to ruptured aneurysm ([place])    2003 - s/p coiling  . Unspecified vitamin D deficiency     Past Surgical History:  Procedure Laterality Date  . ABDOMINAL HYSTERECTOMY    . ANEURYSM COILING    . head surgery     aneurysm ruptured- stroke  . IR 3D INDEPENDENT WKST  09/04/2016  . IR ANGIO INTRA EXTRACRAN SEL COM CAROTID INNOMINATE BILAT MOD SED  09/04/2016  . IR ANGIO INTRA EXTRACRAN SEL INTERNAL CAROTID UNI L MOD SED  02/21/2017  . IR ANGIO VERTEBRAL SEL VERTEBRAL BILAT MOD SED  09/04/2016  . IR ANGIOGRAM  EXTREMITY LEFT  09/04/2016  . IR ANGIOGRAM FOLLOW UP STUDY  02/21/2017  . IR NEURO EACH ADD'L AFTER BASIC UNI LEFT ([place])  02/21/2017  . IR RADIOLOGIST EVAL & MGMT  10/11/2016  . IR RADIOLOGIST EVAL & MGMT  03/07/2017  . IR TRANSCATH/EMBOLIZ  02/21/2017  . RADIOLOGY WITH ANESTHESIA N/A 04/23/2013   Procedure: RADIOLOGY WITH ANESTHESIA;  Surgeon: Rob Hickman, MD;  Location: [facility];  Service: Radiology;  Laterality: N/A;  . RADIOLOGY WITH ANESTHESIA N/A 10/30/2016   Procedure: EMBOLIZATION;  Surgeon: Luanne Bras, MD;  Location: [facility];  Service: Radiology;  Laterality: N/A;  . RADIOLOGY WITH ANESTHESIA N/A 02/21/2017   Procedure: EMBOLIZATION;  Surgeon: Luanne Bras, MD;  Location: [facility];  Service: Radiology;  Laterality: N/A;  . TONSILLECTOMY AND ADENOIDECTOMY    . TUBAL LIGATION      There were no vitals filed for this visit.  Subjective Assessment - 03/08/17 1025    Pertinent History  Dx: brain aneurysm 02/21/17 with stent procedure followed by AMS and seizure. PMH: HTN, CVA, heart murmur, h/o brain aneurysm in 2001    Limitations  light duty, does not drive (Premorbid)  Patient Stated Goals  Get the strength back in my Rt arm    Currently in Pain?  Yes    Pain Score  8     Pain Location  Arm    Pain Orientation  Right    Pain Descriptors / Indicators  Aching;Throbbing    Pain Type  Acute pain    Pain Onset  1 to 4 weeks ago    Pain Frequency  Constant        [facility] OT Assessment - 03/08/17 0001      Assessment   Medical Diagnosis  brain aneurysm    Referring Provider  Vicie Mutters PA-C    Onset Date/Surgical Date  02/21/17    Hand Dominance  Right    Next MD Visit  03/19/17 Dr. Leta Baptist      Precautions   Precaution Comments  light duty per pt report      Restrictions   Weight Bearing Restrictions  No      Balance Screen   Has the patient fallen in the past 6 months  No    Has the patient had a decrease in activity level because of a fear of falling?   No     Is the patient reluctant to leave their home because of a fear of falling?   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      ADL   Eating/Feeding  Independent    Grooming  Independent    Chiropodist -  Hygiene  Modified Independent difficulty Licensed conveyancer  Independent      IADL   Shopping  Needs to be accompanied on any shopping trip only assist for transportation    Light Housekeeping  Performs light daily tasks such as dishwashing, bed making;Does personal laundry completely Pt reports having to stop and take breaks more     Meal Prep  Plans, prepares and serves adequate meals independently    Chief Operating Officer on family or friends for transportation    Medication Management  Is responsible for taking medication in correct dosages at correct time    Psychiatrist financial matters independently (budgets, writes checks, pays rent, bills goes to bank), collects and keeps track of income      Mobility   Mobility Status  Independent      Written Expression   Dominant Hand  Right    Handwriting  100% legible      Vision - History   Baseline Vision  Wears glasses all the time    Additional Comments  denies change in vision from aneurysm      Cognition   Cognition Comments  Reports some memory changes but compensating well for it.       Sensation  Additional Comments  Pt  reports she can feel everything in Rt hand, but some hypersensitivity      Coordination   9 Hole Peg Test  Right;Left    Right 9 Hole Peg Test  25.38 sec    Left 9 Hole Peg Test  26.04 sec      Edema   Edema  none Pt had some in Rt hand but resolved      ROM / Strength   AROM / PROM / Strength  AROM;Strength      AROM   Overall AROM Comments  LUE AROM WNL's. RUE WFL's, however tight AND some pain near end range sh. flex, ER, IR, elbow ext, and wrist ext      Strength   Overall Strength Comments  LUE MMT 5/5, RUE MMT 4+/5 grossly but with pain during MMT. Pt also reports decreased endurance RUE       Hand Function   Right Hand Grip (lbs)  50 lbs    Left Hand Grip (lbs)  64 lbs                           OT Long Term Goals - 03/08/17 1655      OT LONG TERM GOAL #1   Title  Pt will be independent with RUE HEP    Time  4    Period  Weeks    Status  New    Target Date  04/05/17      OT LONG TERM GOAL #2   Title  Pt will improve grip strength Rt hand to 60 lbs     Baseline  50 lbs    Time  4    Period  Weeks    Status  New      OT LONG TERM GOAL #3   Title  Pt will verbalize understanding with pain reduction strategies for Rt arm    Time  4    Period  Weeks    Status  New      OT LONG TERM GOAL #4   Title  Pt will perform overhead reaching activities for 5 min. w/o rest    Time  4    Period  Weeks    Status  New            Plan - 03/08/17 1650    Clinical Impression Statement  Pt is a 62 y.o. female who presents to outpatient rehab s/p brain aneurysm (Lt ICA) on 02/21/17. Pt had stent procedure, followed by altered mental status and seizure and required intubation. Pt now presents to occupational therapy with pain in Rt arm, decreased arm and hand strength, and decreased endurance using Rt arm    Occupational Profile and client history currently impacting functional performance  PMH: HTN, CVA, heart murmur, brain aneurysm 2001 x 3     Occupational performance deficits (Please refer to evaluation for details):  IADL's;Leisure;Social Participation;Work    Rehab Potential  Good    OT Frequency  2x / week    OT Duration  4 weeks plus evaluation    OT Treatment/Interventions  Moist Heat;DME and/or AE instruction;Therapeutic activities;Therapeutic exercise;Neuromuscular education;Passive range of motion;Manual Therapy;Patient/family education;Energy conservation    Plan  HEP for Rt shoulder ROM in flex, abduction, ext, IR, ER (? cane), low range strengthening, putty HEP     Clinical Decision Making  Limited treatment options, no task modification necessary    Consulted and Agree with  Plan of Care  Patient       Patient will benefit from skilled therapeutic intervention in order to improve the following deficits and impairments:  Decreased coordination, Decreased range of motion, Impaired flexibility, Decreased endurance, Impaired sensation, Impaired UE functional use, Pain, Decreased strength, Decreased activity tolerance  Visit Diagnosis: Pain in right arm - Plan: Ot plan of care cert/re-cert  Muscle weakness (generalized) - Plan: Ot plan of care cert/re-cert    Problem List Patient Active Problem List   Diagnosis Date Noted  . Seizure ([place])   . Gout 10/23/2014  . Obesity 04/01/2014  . Prediabetes 11/11/2013  . Medication management 11/11/2013  . Brain aneurysm 04/23/2013  . OSA on CPAP   . Vitamin D deficiency   . Hyperlipidemia   . Mitral regurgitation 06/25/2012  . Chronic diastolic heart failure ([place]) 06/25/2012  . Essential hypertension 02/14/2012    Carey Bullocks, OTR/L 03/08/2017, 4:59 PM  [facility] [address] [place], [place], [address] Phone: [phone]   Fax:  [phone]  Name: Yolanda Espinoza MRN: [id number] Date of Birth: [birth year]

## 2017-03-19 ENCOUNTER — Ambulatory Visit: Payer: BLUE CROSS/BLUE SHIELD | Admitting: Diagnostic Neuroimaging

## 2017-03-19 ENCOUNTER — Encounter: Payer: Self-pay | Admitting: Diagnostic Neuroimaging

## 2017-03-19 VITALS — BP 136/81 | HR 65 | Ht 71.0 in | Wt 214.6 lb

## 2017-03-19 DIAGNOSIS — R569 Unspecified convulsions: Secondary | ICD-10-CM

## 2017-03-19 DIAGNOSIS — Z8679 Personal history of other diseases of the circulatory system: Secondary | ICD-10-CM

## 2017-03-19 DIAGNOSIS — I671 Cerebral aneurysm, nonruptured: Secondary | ICD-10-CM | POA: Diagnosis not present

## 2017-03-19 MED ORDER — LEVETIRACETAM 1000 MG PO TABS: 1000.0000 mg | ORAL_TABLET | Freq: Two times a day (BID) | ORAL | 4 refills | Status: DC

## 2017-03-19 NOTE — Progress Notes (Signed)
[facility]  PATIENT: Yolanda Espinoza DOB: 1955/03/24  REFERRING CLINICIAN: Lovena Neighbours, PA HISTORY FROM: patient and chart review  REASON FOR VISIT: new consult    HISTORICAL  CHIEF COMPLAINT:  Chief Complaint  Patient presents with  . Seizures    rm 7, New Pt, "post op seizure, re: left ICA aneurysm surgery Jan 2019"    HISTORY OF PRESENT ILLNESS:   62 year old female with history of right ICA aneurysm status post coiling in 2003, presented for endovascular treatment of 2 left ICA paraophthalmic aneurysms in January 2019, here for evaluation of seizure.  Immediately following treatment in February 21, 2017, patient was noted to have seizure-like activity.  She was started on antiseizure medication.  MRI showed a few punctate foci of DWI hyperintensity in the left hemisphere, possibly periprocedural complication.  Since that time no further seizure.  Patient is tolerating medications.  Per Dr. Rosann Auerbach note on 02/21/17 --> "Whilst being repositioned in the bed ,patients RT side of her face was seen to draw up and twitch,with head turning to the right followed by generalized tonic clonic seizure lasting  approx 3 mins followed by post ictal state."     REVIEW OF SYSTEMS: Full 14 system review of systems performed and negative with exception of: Murmur snoring swelling in legs seizure anemia.  ALLERGIES: Allergies  Allergen Reactions  . Ace Inhibitors Nausea And Vomiting  . Aspirin Nausea And Vomiting    Can take coated Aspirin    HOME MEDICATIONS: Outpatient Medications Prior to Visit  Medication Sig Dispense Refill  . amLODipine (NORVASC) 10 MG tablet Take 1 tablet (10 mg total) by mouth daily. 90 tablet 0  . aspirin EC 81 MG tablet Take 1 tablet (81 mg total) by mouth daily.    . Calcium Carb-Cholecalciferol (CALCIUM 600 + D PO) Take 1 tablet by mouth daily.    . cyclobenzaprine (FLEXERIL) 10 MG tablet Take 1 tablet (10 mg total) by mouth at bedtime as  needed for muscle spasms. 90 tablet 0  . estrogens, conjugated, (PREMARIN) 0.9 MG tablet TAKE 1 TABLET (0.9mg ) BY MOUTH ONCE DAILY. 90 tablet 1  . ferrous sulfate 325 (65 FE) MG tablet Take 325 mg by mouth daily with breakfast.    . furosemide (LASIX) 40 MG tablet Take 1 tablet (40 mg total) by mouth daily as needed for edema. 90 tablet 0  . levETIRAcetam (KEPPRA) 1000 MG tablet Take 1 tablet (1,000 mg total) by mouth 2 (two) times daily. 180 tablet 0  . magnesium gluconate (MAGONATE) 500 MG tablet Take 500 mg by mouth daily.    . Multiple Vitamins-Minerals (MULTIVITAMIN WITH MINERALS) tablet Take 1 tablet by mouth daily.    Marland Kitchen olmesartan (BENICAR) 40 MG tablet Take 1 tablet (40 mg total) by mouth daily. 90 tablet 0  . Omega-3 Fatty Acids (FISH OIL) 1200 MG CAPS Take 2,400 mg by mouth daily.     . ticagrelor (BRILINTA) 90 MG TABS tablet Take 1 tablet (90 mg total) by mouth 2 (two) times daily. 90 tablet 1  . vitamin B-12 (CYANOCOBALAMIN) 1000 MCG tablet Take 1,000 mcg by mouth daily.     No facility-administered medications prior to visit.     PAST MEDICAL HISTORY: Past Medical History:  Diagnosis Date  . Anemia   . Arthritis   . Chronic diastolic heart failure ([place])   . GERD (gastroesophageal reflux disease)   . Heart murmur   . Hyperlipidemia   . Hypertension may, 1973  .  Mitral regurgitation    a. Echo 01/2012 mild LVH, EF 55-65%, Gr 2 DD, mod MR, mild LAE, PASP 36;  b. Echo (02/2013):  EF 55-60%, Gr 1 DD, mild to mod MR, mild LAE (no sig change since prior echo) // c. Echo 10/18: EF 55-60, normal wall motion, grade 2 diastolic dysfunction, trivial AI, mild to moderate MR, mild LAE, trivial PI, PASP 38    . OSA on CPAP    7 yrs  . Peripheral vascular disease ([place])    cerebral aneursym  . Seizures ([place]) 01/2017  . Stroke ([place]) 07   no weakness  . Subarachnoid hemorrhage due to ruptured aneurysm ([place])    2003 - s/p coiling  . Unspecified vitamin D deficiency     PAST SURGICAL  HISTORY: Past Surgical History:  Procedure Laterality Date  . ABDOMINAL HYSTERECTOMY    . ANEURYSM COILING    . head surgery     aneurysm ruptured- stroke  . IR 3D INDEPENDENT WKST  09/04/2016  . IR ANGIO INTRA EXTRACRAN SEL COM CAROTID INNOMINATE BILAT MOD SED  09/04/2016  . IR ANGIO INTRA EXTRACRAN SEL INTERNAL CAROTID UNI L MOD SED  02/21/2017  . IR ANGIO VERTEBRAL SEL VERTEBRAL BILAT MOD SED  09/04/2016  . IR ANGIOGRAM EXTREMITY LEFT  09/04/2016  . IR ANGIOGRAM FOLLOW UP STUDY  02/21/2017  . IR NEURO EACH ADD'L AFTER BASIC UNI LEFT (MS)  02/21/2017  . IR RADIOLOGIST EVAL & MGMT  10/11/2016  . IR RADIOLOGIST EVAL & MGMT  03/07/2017  . IR TRANSCATH/EMBOLIZ  02/21/2017  . RADIOLOGY WITH ANESTHESIA N/A 04/23/2013   Procedure: RADIOLOGY WITH ANESTHESIA;  Surgeon: Rob Hickman, MD;  Location: [facility];  Service: Radiology;  Laterality: N/A;  . RADIOLOGY WITH ANESTHESIA N/A 10/30/2016   Procedure: EMBOLIZATION;  Surgeon: Luanne Bras, MD;  Location: [facility];  Service: Radiology;  Laterality: N/A;  . RADIOLOGY WITH ANESTHESIA N/A 02/21/2017   Procedure: EMBOLIZATION;  Surgeon: Luanne Bras, MD;  Location: [facility];  Service: Radiology;  Laterality: N/A;  . TONSILLECTOMY AND ADENOIDECTOMY    . TUBAL LIGATION      FAMILY HISTORY: Family History  Problem Relation Age of Onset  . Hypertension Father   . Kidney disease Father   . Hypertension Sister   . Hypertension Sister   . Hypertension Sister     SOCIAL HISTORY:  Social History   Socioeconomic History  . Marital status: Married    Spouse name: Not on file  . Number of children: 2  . Years of education: 34  . Highest education level: Not on file  Social Needs  . Financial resource strain: Not on file  . Food insecurity - worry: Not on file  . Food insecurity - inability: Not on file  . Transportation needs - medical: Not on file  . Transportation needs - non-medical: Not on file  Occupational History    Comment:  [employer], part time  Tobacco Use  . Smoking status: Former Smoker    Packs/day: 0.25    Years: 3.00    Pack years: 0.75    Types: Cigarettes    Last attempt to quit: 01/24/1999    Years since quitting: 18.1  . Smokeless tobacco: Never Used  Substance and Sexual Activity  . Alcohol use: No  . Drug use: No  . Sexual activity: Not on file  Other Topics Concern  . Not on file  Social History Narrative   Lives with husband   Caffeine- maybe  1/2 soda, 1 1/2 cups coffee     PHYSICAL EXAM  GENERAL EXAM/CONSTITUTIONAL: Vitals:  Vitals:   03/19/17 0803  BP: 136/81  Pulse: 65  Weight: 214 lb 9.6 oz (97.3 kg)  Height: 5\' 11"  (1.803 m)     Body mass index is 29.93 kg/m.  Visual Acuity Screening   Right eye Left eye Both eyes  Without correction: 20/30 20/30   With correction:        Patient is in no distress; well developed, nourished and groomed; neck is supple  CARDIOVASCULAR:  Examination of carotid arteries is normal; no carotid bruits  Regular rate and rhythm, no murmurs  Examination of peripheral vascular system by observation and palpation is normal  EYES:  Ophthalmoscopic exam of optic discs and posterior segments is normal; no papilledema or hemorrhages  MUSCULOSKELETAL:  Gait, strength, tone, movements noted in Neurologic exam below  NEUROLOGIC: MENTAL STATUS:  No flowsheet data found.  awake, alert, oriented to person, place and time  recent and remote memory intact  normal attention and concentration  language fluent, comprehension intact, naming intact,   fund of knowledge appropriate  CRANIAL NERVE:   2nd - no papilledema on fundoscopic exam  2nd, 3rd, 4th, 6th - pupils equal and reactive to light, visual fields full to confrontation, extraocular muscles intact, no nystagmus  5th - facial sensation symmetric  7th - facial strength symmetric  8th - hearing intact  9th - palate elevates symmetrically, uvula midline  11th -  shoulder shrug symmetric  12th - tongue protrusion midline  MOTOR:   normal bulk and tone, full strength in the BUE, BLE  SLIGHT STIFFNESS IN RUE   SENSORY:   normal and symmetric to light touch, temperature, vibration  COORDINATION:   finger-nose-finger, fine finger movements normal  REFLEXES:   deep tendon reflexes TRACE and symmetric  GAIT/STATION:   narrow based gait    DIAGNOSTIC DATA (LABS, IMAGING, TESTING) - I reviewed patient records, labs, notes, testing and imaging myself where available.  Lab Results  Component Value Date   WBC 6.5 02/28/2017   HGB 8.9 (L) 02/28/2017   HCT 27.7 (L) 02/28/2017   MCV 82.4 02/28/2017   PLT 299 02/28/2017      Component Value Date/Time   NA 142 02/28/2017 1215   NA 140 11/08/2016 1149   K 4.6 02/28/2017 1215   CL 107 02/28/2017 1215   CO2 29 02/28/2017 1215   GLUCOSE 80 02/28/2017 1215   BUN 13 02/28/2017 1215   BUN 12 11/08/2016 1149   CREATININE 1.08 (H) 02/28/2017 1215   CALCIUM 9.6 02/28/2017 1215   PROT 7.3 02/28/2017 1215   ALBUMIN 3.0 (L) 02/23/2017 0322   AST 12 02/28/2017 1215   ALT 6 02/28/2017 1215   ALKPHOS 38 02/23/2017 0322   BILITOT 0.2 02/28/2017 1215   GFRNONAA 55 (L) 02/28/2017 1215   GFRAA 64 02/28/2017 1215   Lab Results  Component Value Date   CHOL 192 01/05/2017   HDL 141 01/05/2017   LDLCALC 42 10/26/2015   TRIG 110 02/21/2017   CHOLHDL 1.4 01/05/2017   Lab Results  Component Value Date   HGBA1C 5.3 01/05/2017   Lab Results  Component Value Date   DDUKGURK27 062 09/06/2016   Lab Results  Component Value Date   TSH 2.09 01/05/2017   09/04/16 cerebral angiogram - Interval complete obliteration of the right internal carotid artery superior hypophyseal region aneurysm with wide patency of the adjacent flow diverter device. -  A 3.3 mm x 3 mm left internal carotid artery superior hypophyseal region aneurysm. - Wide neck neck aneurysmal dilatation of the left internal  carotid artery at the level of the ophthalmic artery measuring 2.6 mm x 2.5 mm.  02/21/17 cerebral angiogram / endovascular treatment - Status post endovascular treatment of irregular lobulated 2 paraophthalmic region aneurysms using the pipeline flow diverter device as described above.  02/21/17 MRI brain [I reviewed images myself and agree with interpretation. -VRP]  1. Punctate acute/early subacute cortical infarct in left parietal lobe and possible additional punctate infarct in left insula. No associated hemorrhage or mass effect. 2. Stable mild chronic microvascular ischemic changes and mild parenchymal volume loss of the brain. 3. No structural cause of seizure identified.  02/22/17 EEG - This is an abnormal EEG due to mild background slowing.  This is a non-specific finding that can be seen with toxic, metabolic, diffuse, or multifocal structural processes.  No definite epileptiform changes were noted.   A single EEG without epileptiform changes does not exclude the diagnosis of epilepsy. Clinical correlation advised.     ASSESSMENT AND PLAN  62 y.o. year old female here with history of right ICA aneurysm status post coiling in 2003, status post left ICA aneurysm x2 endovascular treatment in January 2019, with postprocedure seizure.  Now patient on antiseizure medication and doing well.  Will plan for treatment for at least a few years and then reevaluate whether longer term treatment is necessary.   Ddx:  1. Seizure ([place])   2. Cerebral aneurysm without rupture   3. H/O spontaneous subarachnoid intracranial hemorrhage due to cerebral aneurysm      PLAN: - continue levetiracetam 1000mg  twice a day; plan for long term treatment (at least 2-3 years); then may re-evaluate  - According to [facility] law, you can not drive unless you are seizure / syncope free for at least 6 months and under physician's care.  (last seizure on 02/21/17) --> also patient does not drive a car (never has a  license; never drove a car)  - Please maintain precautions. Do not participate in activities where a loss of awareness could harm you or someone else. No swimming alone, no tub bathing, no hot tubs, no driving, no operating motorized vehicles (cars, ATVs, motocycles, etc), lawnmowers, power tools or firearms. No standing at heights, such as rooftops, ladders or stairs. Avoid hot objects such as stoves, heaters, open fires. Wear a helmet when riding a bicycle, scooter, skateboard, etc. and avoid areas of traffic. Set your water heater to 120 degrees or less.  Meds ordered this encounter  Medications  . levETIRAcetam (KEPPRA) 1000 MG tablet    Sig: Take 1 tablet (1,000 mg total) by mouth 2 (two) times daily.    Dispense:  180 tablet    Refill:  4   Return in about 6 months (around 09/16/2017).  I reviewed images, labs, notes, records myself. I summarized findings and reviewed with patient, for this high risk condition (seizure) requiring high complexity decision making.    Penni Bombard, MD 2/50/5397, 6:73 AM Certified in Neurology, Neurophysiology and Neuroimaging  [facility] [address] [place], [place] [address] [phone]

## 2017-03-19 NOTE — Patient Instructions (Signed)
-   continue levetiracetam 1000mg  twice a day  - According to [place] law, you can not drive unless you are seizure / syncope free for at least 6 months and under physician's care.  (last seizure on 02/21/17)  - Please maintain precautions. Do not participate in activities where a loss of awareness could harm you or someone else. No swimming alone, no tub bathing, no hot tubs, no driving, no operating motorized vehicles (cars, ATVs, motocycles, etc), lawnmowers, power tools or firearms. No standing at heights, such as rooftops, ladders or stairs. Avoid hot objects such as stoves, heaters, open fires. Wear a helmet when riding a bicycle, scooter, skateboard, etc. and avoid areas of traffic. Set your water heater to 120 degrees or less.

## 2017-03-21 ENCOUNTER — Ambulatory Visit: Payer: BLUE CROSS/BLUE SHIELD | Admitting: Occupational Therapy

## 2017-03-21 DIAGNOSIS — M79601 Pain in right arm: Secondary | ICD-10-CM

## 2017-03-21 DIAGNOSIS — M6281 Muscle weakness (generalized): Secondary | ICD-10-CM | POA: Diagnosis not present

## 2017-03-21 NOTE — Patient Instructions (Addendum)
   Cane Overhead - Sitting   With arms straight, hold cane forward at waist. Raise cane above head. Hold 3 seconds. Repeat 10 times. Do 2 times per day. Only raise to shoulder height  Copyright  VHI. All rights reserved.  Flexion (Eccentric) - Active-Assist (Cane) Can perform in sitting position    Use unaffected arm to push affected arm forward. Avoid hiking shoulder. Keep palm relaxed. Slowly lower affected arm for 3-5 seconds, increasing use of affected arm. 10 reps per set, 2 sets per day.     http://ecce.exer.us/152   Copyright  VHI. All rights reserved.  ROM: Flexion - Wand  . Holding cane or broomstick, bend and straighten your elbows. 10-20 reps 1-2 x day  1. Grip Strengthening (Resistive Putty)   Squeeze putty using thumb and all fingers. Repeat _20___ times. Do __2__ sessions per day.   2. Roll putty into tube on table and pinch between each finger and thumb x 10 reps each. (can do ring and small finger together)     Copyright  VHI. All rights reserved.

## 2017-03-21 NOTE — Therapy (Signed)
[facility] [address] [place], [place], [address] Phone: [phone]   Fax:  [phone]  Occupational Therapy Treatment  Patient Details  Name: SHABRIA EGLEY MRN: [id number] Date of Birth: [birth year] Referring Provider: Vicie Mutters PA-C   Encounter Date: 03/21/2017  OT End of Session - 03/21/17 1347    Visit Number  2    Number of Visits  9    Date for OT Re-Evaluation  04/05/17    Authorization Type  BC/BS    OT Start Time  1318    OT Stop Time  1400    OT Time Calculation (min)  42 min    Activity Tolerance  Patient tolerated treatment well    Behavior During Therapy  [facility] for tasks assessed/performed       Past Medical History:  Diagnosis Date  . Anemia   . Arthritis   . Chronic diastolic heart failure ([place])   . GERD (gastroesophageal reflux disease)   . Heart murmur   . Hyperlipidemia   . Hypertension may, 1973  . Mitral regurgitation    a. Echo 01/2012 mild LVH, EF 55-65%, Gr 2 DD, mod MR, mild LAE, PASP 36;  b. Echo (02/2013):  EF 55-60%, Gr 1 DD, mild to mod MR, mild LAE (no sig change since prior echo) // c. Echo 10/18: EF 55-60, normal wall motion, grade 2 diastolic dysfunction, trivial AI, mild to moderate MR, mild LAE, trivial PI, PASP 38    . OSA on CPAP    7 yrs  . Peripheral vascular disease ([place])    cerebral aneursym  . Seizures ([place]) 01/2017  . Stroke ([place]) 07   no weakness  . Subarachnoid hemorrhage due to ruptured aneurysm ([place])    2003 - s/p coiling  . Unspecified vitamin D deficiency     Past Surgical History:  Procedure Laterality Date  . ABDOMINAL HYSTERECTOMY    . ANEURYSM COILING    . head surgery     aneurysm ruptured- stroke  . IR 3D INDEPENDENT WKST  09/04/2016  . IR ANGIO INTRA EXTRACRAN SEL COM CAROTID INNOMINATE BILAT MOD SED  09/04/2016  . IR ANGIO INTRA EXTRACRAN SEL INTERNAL CAROTID UNI L MOD SED  02/21/2017  . IR ANGIO VERTEBRAL SEL VERTEBRAL BILAT MOD SED   09/04/2016  . IR ANGIOGRAM EXTREMITY LEFT  09/04/2016  . IR ANGIOGRAM FOLLOW UP STUDY  02/21/2017  . IR NEURO EACH ADD'L AFTER BASIC UNI LEFT (MS)  02/21/2017  . IR RADIOLOGIST EVAL & MGMT  10/11/2016  . IR RADIOLOGIST EVAL & MGMT  03/07/2017  . IR TRANSCATH/EMBOLIZ  02/21/2017  . RADIOLOGY WITH ANESTHESIA N/A 04/23/2013   Procedure: RADIOLOGY WITH ANESTHESIA;  Surgeon: Rob Hickman, MD;  Location: [facility];  Service: Radiology;  Laterality: N/A;  . RADIOLOGY WITH ANESTHESIA N/A 10/30/2016   Procedure: EMBOLIZATION;  Surgeon: Luanne Bras, MD;  Location: [facility];  Service: Radiology;  Laterality: N/A;  . RADIOLOGY WITH ANESTHESIA N/A 02/21/2017   Procedure: EMBOLIZATION;  Surgeon: Luanne Bras, MD;  Location: [facility];  Service: Radiology;  Laterality: N/A;  . TONSILLECTOMY AND ADENOIDECTOMY    . TUBAL LIGATION      There were no vitals filed for this visit.  Subjective Assessment - 03/21/17 1347    Subjective   Pt reports arm pain and stidffness    Pertinent History  Dx: brain aneurysm 02/21/17 with stent procedure followed by AMS and seizure. PMH: HTN, CVA, heart murmur, h/o brain  aneurysm in 2001    Patient Stated Goals  Get the strength back in my Rt arm    Currently in Pain?  Yes    Pain Score  3     Pain Location  Arm    Pain Orientation  Right    Pain Descriptors / Indicators  Aching    Pain Type  Acute pain    Pain Onset  1 to 4 weeks ago    Pain Frequency  Intermittent    Aggravating Factors   overuse    Pain Relieving Factors  rest           Treatment: Arm bike x 5 mins level 1 for conditioning, pt demonstrates significant fatigue requiring 1 rest break UE ranger in standing for shoulder flexion, circumduction min facilitation.               OT Education - 03/21/17 1354    Education provided  Yes    Education Details  inital cane exercises seated, red putty    Person(s) Educated  Patient    Methods  Explanation;Demonstration;Verbal cues;Tactile  cues;Handout    Comprehension  Verbalized understanding;Returned demonstration;Verbal cues required min-mod v.c for proper positioning with cane exercises   min-mod v.c for proper positioning with cane exercises         OT Long Term Goals - 03/08/17 1655      OT LONG TERM GOAL #1   Title  Pt will be independent with RUE HEP    Time  4    Period  Weeks    Status  New    Target Date  04/05/17      OT LONG TERM GOAL #2   Title  Pt will improve grip strength Rt hand to 60 lbs     Baseline  50 lbs    Time  4    Period  Weeks    Status  New      OT LONG TERM GOAL #3   Title  Pt will verbalize understanding with pain reduction strategies for Rt arm    Time  4    Period  Weeks    Status  New      OT LONG TERM GOAL #4   Title  Pt will perform overhead reaching activities for 5 min. w/o rest    Time  4    Period  Weeks    Status  New            Plan - 03/21/17 1352    Clinical Impression Statement  Pt is progressing towards goals for HEP. She can benefit from review for proper positioning.    Rehab Potential  Good    OT Frequency  2x / week    OT Duration  4 weeks    OT Treatment/Interventions  Moist Heat;DME and/or AE instruction;Therapeutic activities;Therapeutic exercise;Neuromuscular education;Passive range of motion;Manual Therapy;Patient/family education;Energy conservation    Plan  check HEP progress and able, functional use of RUE    Consulted and Agree with Plan of Care  Patient       Patient will benefit from skilled therapeutic intervention in order to improve the following deficits and impairments:  Decreased coordination, Decreased range of motion, Impaired flexibility, Decreased endurance, Impaired sensation, Impaired UE functional use, Pain, Decreased strength, Decreased activity tolerance  Visit Diagnosis: Pain in right arm  Muscle weakness (generalized)    Problem List Patient Active Problem List   Diagnosis Date Noted  . Seizure ([place])   .  Gout 10/23/2014  . Obesity 04/01/2014  . Prediabetes 11/11/2013  . Medication management 11/11/2013  . Brain aneurysm 04/23/2013  . OSA on CPAP   . Vitamin D deficiency   . Hyperlipidemia   . Mitral regurgitation 06/25/2012  . Chronic diastolic heart failure ([place]) 06/25/2012  . Essential hypertension 02/14/2012    Sherron Mummert 03/21/2017, 1:56 PM  [facility] [address] [place], [place], [address] Phone: [phone]   Fax:  [phone]  Name: SAESHA LLERENAS MRN: [id number] Date of Birth: Mar 15, 1955

## 2017-03-23 ENCOUNTER — Ambulatory Visit: Payer: BLUE CROSS/BLUE SHIELD | Attending: Internal Medicine | Admitting: Occupational Therapy

## 2017-03-23 DIAGNOSIS — M79601 Pain in right arm: Secondary | ICD-10-CM | POA: Insufficient documentation

## 2017-03-23 DIAGNOSIS — M6281 Muscle weakness (generalized): Secondary | ICD-10-CM | POA: Insufficient documentation

## 2017-03-28 ENCOUNTER — Ambulatory Visit: Payer: BLUE CROSS/BLUE SHIELD | Admitting: Occupational Therapy

## 2017-03-28 DIAGNOSIS — M79601 Pain in right arm: Secondary | ICD-10-CM | POA: Diagnosis not present

## 2017-03-28 DIAGNOSIS — M6281 Muscle weakness (generalized): Secondary | ICD-10-CM | POA: Diagnosis not present

## 2017-03-28 NOTE — Therapy (Signed)
[facility] [address] [place], [place], [address] Phone: [phone]   Fax:  [phone]  Occupational Therapy Treatment  Patient Details  Name: Yolanda Espinoza MRN: [id number] Date of Birth: 08/08/1955 Referring Provider: Vicie Mutters PA-C   Encounter Date: 03/28/2017  OT End of Session - 03/28/17 1624    Visit Number  3    Number of Visits  9    Authorization Type  BC/BS    OT Start Time  1622    OT Stop Time  1700    OT Time Calculation (min)  38 min       Past Medical History:  Diagnosis Date  . Anemia   . Arthritis   . Chronic diastolic heart failure ([place])   . GERD (gastroesophageal reflux disease)   . Heart murmur   . Hyperlipidemia   . Hypertension may, 1973  . Mitral regurgitation    a. Echo 01/2012 mild LVH, EF 55-65%, Gr 2 DD, mod MR, mild LAE, PASP 36;  b. Echo (02/2013):  EF 55-60%, Gr 1 DD, mild to mod MR, mild LAE (no sig change since prior echo) // c. Echo 10/18: EF 55-60, normal wall motion, grade 2 diastolic dysfunction, trivial AI, mild to moderate MR, mild LAE, trivial PI, PASP 38    . OSA on CPAP    7 yrs  . Peripheral vascular disease ([place])    cerebral aneursym  . Seizures ([facility]) 01/2017  . Stroke ([place]) 07   no weakness  . Subarachnoid hemorrhage due to ruptured aneurysm ([place])    2003 - s/p coiling  . Unspecified vitamin D deficiency     Past Surgical History:  Procedure Laterality Date  . ABDOMINAL HYSTERECTOMY    . ANEURYSM COILING    . head surgery     aneurysm ruptured- stroke  . IR 3D INDEPENDENT WKST  09/04/2016  . IR ANGIO INTRA EXTRACRAN SEL COM CAROTID INNOMINATE BILAT MOD SED  09/04/2016  . IR ANGIO INTRA EXTRACRAN SEL INTERNAL CAROTID UNI L MOD SED  02/21/2017  . IR ANGIO VERTEBRAL SEL VERTEBRAL BILAT MOD SED  09/04/2016  . IR ANGIOGRAM EXTREMITY LEFT  09/04/2016  . IR ANGIOGRAM FOLLOW UP STUDY  02/21/2017  . IR NEURO EACH ADD'L AFTER BASIC UNI LEFT (MS)  02/21/2017  . IR  RADIOLOGIST EVAL & MGMT  10/11/2016  . IR RADIOLOGIST EVAL & MGMT  03/07/2017  . IR TRANSCATH/EMBOLIZ  02/21/2017  . RADIOLOGY WITH ANESTHESIA N/A 04/23/2013   Procedure: RADIOLOGY WITH ANESTHESIA;  Surgeon: Rob Hickman, MD;  Location: [facility];  Service: Radiology;  Laterality: N/A;  . RADIOLOGY WITH ANESTHESIA N/A 10/30/2016   Procedure: EMBOLIZATION;  Surgeon: Luanne Bras, MD;  Location: [facility];  Service: Radiology;  Laterality: N/A;  . RADIOLOGY WITH ANESTHESIA N/A 02/21/2017   Procedure: EMBOLIZATION;  Surgeon: Luanne Bras, MD;  Location: [facility];  Service: Radiology;  Laterality: N/A;  . TONSILLECTOMY AND ADENOIDECTOMY    . TUBAL LIGATION      There were no vitals filed for this visit.  Subjective Assessment - 03/28/17 1623    Subjective   Denies pain    Pertinent History  Dx: brain aneurysm 02/21/17 with stent procedure followed by AMS and seizure. PMH: HTN, CVA, heart murmur, h/o brain aneurysm in 2001    Limitations  light duty, does not drive (Premorbid)    Patient Stated Goals  Get the strength back in my Rt arm    Currently in  Pain?  No/denies               Treatment: supine shoulder flexion and chest press closed chain, min v.c then seated with medium ball shoulder flexion, with min facilitation at right shoulder and scapula. Wall slides x 10 with min v.c Quadraped over ball lifting alternate UE then rolling ball forwards and back x 10 reps, min facilitation Arm bike level 3 x 5 mins for conditioning Reviewed putty exercises, pt returned demonstration, then pulling pegs from putty with RUE, min v.c Placing and removing graded clothespins with RUE for sustained pinch/ functional reach, min v.c                OT Long Term Goals - 03/28/17 1652      OT LONG TERM GOAL #1   Status  On-going      OT LONG TERM GOAL #2   Title  Pt will improve grip strength Rt hand to 60 lbs     Status  On-going      OT LONG TERM GOAL #3   Title  Pt will  verbalize understanding with pain reduction strategies for Rt arm    Status  On-going      OT LONG TERM GOAL #4   Title  Pt will perform overhead reaching activities for 5 min. w/o rest    Status  On-going            Plan - 03/28/17 1654    Clinical Impression Statement  Pt is progressing towards goals for HEP. Pt was encouraged to incorporate RUE into light daily activities as possible.    Occupational Profile and client history currently impacting functional performance  PMH: HTN, CVA, heart murmur, brain aneurysm 2001 x 3    Occupational performance deficits (Please refer to evaluation for details):  IADL's;Leisure;Social Participation;Work    Rehab Potential  Good    OT Frequency  2x / week    OT Duration  4 weeks    OT Treatment/Interventions  Moist Heat;DME and/or AE instruction;Therapeutic activities;Therapeutic exercise;Neuromuscular education;Passive range of motion;Manual Therapy;Patient/family education;Energy conservation    Plan  issue ball exercises, functional use of RUE    Consulted and Agree with Plan of Care  Patient       Patient will benefit from skilled therapeutic intervention in order to improve the following deficits and impairments:  Decreased coordination, Decreased range of motion, Impaired flexibility, Decreased endurance, Impaired sensation, Impaired UE functional use, Pain, Decreased strength, Decreased activity tolerance  Visit Diagnosis: Pain in right arm  Muscle weakness (generalized)    Problem List Patient Active Problem List   Diagnosis Date Noted  . Seizure ([facility])   . Gout 10/23/2014  . Obesity 04/01/2014  . Prediabetes 11/11/2013  . Medication management 11/11/2013  . Brain aneurysm 04/23/2013  . OSA on CPAP   . Vitamin D deficiency   . Hyperlipidemia   . Mitral regurgitation 06/25/2012  . Chronic diastolic heart failure ([facility]) 06/25/2012  . Essential hypertension 02/14/2012    Syvanna Ciolino 03/28/2017, 4:56 PM  [facility] [address] [place], [place], [address] Phone: [phone]   Fax:  [phone]  Name: Yolanda Espinoza MRN: [id number] Date of Birth: 1955-11-29

## 2017-03-30 ENCOUNTER — Ambulatory Visit: Payer: BLUE CROSS/BLUE SHIELD | Admitting: Occupational Therapy

## 2017-03-30 DIAGNOSIS — M79601 Pain in right arm: Secondary | ICD-10-CM | POA: Diagnosis not present

## 2017-03-30 DIAGNOSIS — M6281 Muscle weakness (generalized): Secondary | ICD-10-CM | POA: Diagnosis not present

## 2017-03-30 NOTE — Therapy (Signed)
[facility] [address] [place], [place], [address] Phone: [phone]   Fax:  [phone]  Occupational Therapy Treatment  Patient Details  Name: Yolanda Espinoza MRN: [id number] Date of Birth: [birth year] Referring Provider: Vicie Mutters PA-C   Encounter Date: 03/30/2017  OT End of Session - 03/30/17 1532    Visit Number  4    Number of Visits  9    Date for OT Re-Evaluation  04/05/17    Authorization Type  BC/BS    OT Start Time  1451    OT Stop Time  1535    OT Time Calculation (Espinoza)  44 Espinoza    Activity Tolerance  Patient tolerated treatment well    Behavior During Therapy  [facility] for tasks assessed/performed       Past Medical History:  Diagnosis Date  . Anemia   . Arthritis   . Chronic diastolic heart failure ([facility])   . GERD (gastroesophageal reflux disease)   . Heart murmur   . Hyperlipidemia   . Hypertension may, 1973  . Mitral regurgitation    a. Echo 01/2012 mild LVH, EF 55-65%, Gr 2 DD, mod MR, mild LAE, PASP 36;  b. Echo (02/2013):  EF 55-60%, Gr 1 DD, mild to mod MR, mild LAE (no sig change since prior echo) // c. Echo 10/18: EF 55-60, normal wall motion, grade 2 diastolic dysfunction, trivial AI, mild to moderate MR, mild LAE, trivial PI, PASP 38    . OSA on CPAP    7 yrs  . Peripheral vascular disease ([facility])    cerebral aneursym  . Seizures ([facility]) 01/2017  . Stroke ([facility]) 07   no weakness  . Subarachnoid hemorrhage due to ruptured aneurysm ([facility] [place])    2003 - s/p coiling  . Unspecified vitamin D deficiency     Past Surgical History:  Procedure Laterality Date  . ABDOMINAL HYSTERECTOMY    . ANEURYSM COILING    . head surgery     aneurysm ruptured- stroke  . IR 3D INDEPENDENT WKST  09/04/2016  . IR ANGIO INTRA EXTRACRAN SEL COM CAROTID INNOMINATE BILAT MOD SED  09/04/2016  . IR ANGIO INTRA EXTRACRAN SEL INTERNAL CAROTID UNI L MOD SED  02/21/2017  . IR ANGIO VERTEBRAL SEL VERTEBRAL BILAT MOD SED   09/04/2016  . IR ANGIOGRAM EXTREMITY LEFT  09/04/2016  . IR ANGIOGRAM FOLLOW UP STUDY  02/21/2017  . IR NEURO EACH ADD'L AFTER BASIC UNI LEFT (MS)  02/21/2017  . IR RADIOLOGIST EVAL & MGMT  10/11/2016  . IR RADIOLOGIST EVAL & MGMT  03/07/2017  . IR TRANSCATH/EMBOLIZ  02/21/2017  . RADIOLOGY WITH ANESTHESIA N/A 04/23/2013   Procedure: RADIOLOGY WITH ANESTHESIA;  Surgeon: Rob Hickman, MD;  Location: [facility];  Service: Radiology;  Laterality: N/A;  . RADIOLOGY WITH ANESTHESIA N/A 10/30/2016   Procedure: EMBOLIZATION;  Surgeon: Luanne Bras, MD;  Location: [facility];  Service: Radiology;  Laterality: N/A;  . RADIOLOGY WITH ANESTHESIA N/A 02/21/2017   Procedure: EMBOLIZATION;  Surgeon: Luanne Bras, MD;  Location: [facility];  Service: Radiology;  Laterality: N/A;  . TONSILLECTOMY AND ADENOIDECTOMY    . TUBAL LIGATION      There were no vitals filed for this visit.  Subjective Assessment - 03/30/17 1504    Pertinent History  Dx: brain aneurysm 02/21/17 with stent procedure followed by AMS and seizure. PMH: HTN, CVA, heart murmur, h/o brain aneurysm in 2001    Patient Stated Goals  Get the  strength back in my Rt arm    Pain Score  6     Pain Location  Shoulder arm    Pain Orientation  Right    Pain Descriptors / Indicators  Aching    Pain Type  Acute pain    Pain Onset  1 to 4 weeks ago    Pain Frequency  Intermittent    Aggravating Factors   overuse    Pain Relieving Factors  rest    Multiple Pain Sites  No             Treatment: supine joint and soft tissue mobs to right shoulder, followed by closed chain shoulder flexion with cane, Espinoza facilitation right shoulder. Seated closed chain shoulder flexion with PVC pipe frame and then unilateral shoulder flexion, low-mid range with UE ranger, Espinoza facilitation. Hotpack to right shoulder x 10 mins while pt performed fine motor coordination activities with righ hand, flipping, dealing cards and stacking coins, Espinoza-mod difficulty, Espinoza v.c  to avoid compensation with shoulder.Pt reports no pain end of session.              OT Education - 03/30/17 1611    Education provided  Yes    Education Details  bed positioning to minimize right shoulder pain, importance of avoiding shoulder hiking as it likely contributes to shoulder pain.    Person(s) Educated  Patient    Methods  Explanation;Demonstration;Handout    Comprehension  Verbalized understanding;Returned demonstration;Verbal cues required          OT Long Term Goals - 03/28/17 1652      OT LONG TERM GOAL #1   Status  On-going      OT LONG TERM GOAL #2   Title  Pt will improve grip strength Rt hand to 60 lbs     Status  On-going      OT LONG TERM GOAL #3   Title  Pt will verbalize understanding with pain reduction strategies for Rt arm    Status  On-going      OT LONG TERM GOAL #4   Title  Pt will perform overhead reaching activities for 5 Espinoza. w/o rest    Status  On-going            Plan - 03/30/17 1612    Clinical Impression Statement  Pt is progressing slowly towards goals. Right shoulder pain limited pt's functional use today    Occupational Profile and client history currently impacting functional performance  PMH: HTN, CVA, heart murmur, brain aneurysm 2001 x 3    Occupational performance deficits (Please refer to evaluation for details):  IADL's;Leisure;Social Participation;Work    Restaurant manager, fast food Frequency  2x / week    OT Duration  8 weeks    Plan  continue to address right shoulder pain, NMR and functional use,    Consulted and Agree with Plan of Care  Patient       Patient will benefit from skilled therapeutic intervention in order to improve the following deficits and impairments:  Decreased coordination, Decreased range of motion, Impaired flexibility, Decreased endurance, Impaired sensation, Impaired UE functional use, Pain, Decreased strength, Decreased activity tolerance  Visit Diagnosis: Pain in right  arm  Muscle weakness (generalized)    Problem List Patient Active Problem List   Diagnosis Date Noted  . Seizure ([facility])   . Gout 10/23/2014  . Obesity 04/01/2014  . Prediabetes 11/11/2013  . Medication management 11/11/2013  . Brain  aneurysm 04/23/2013  . OSA on CPAP   . Vitamin D deficiency   . Hyperlipidemia   . Mitral regurgitation 06/25/2012  . Chronic diastolic heart failure ([facility]) 06/25/2012  . Essential hypertension 02/14/2012    Shaylee Stanislawski 03/30/2017, 4:14 PM Theone Murdoch, OTR/L [phone] Phone: [phone] 4:17 PM 03/30/17 [facility] [address] [place], [place], [address] Phone: [phone]   Fax:  [phone]  Name: Yolanda Espinoza MRN: [id number] Date of Birth: 1955-08-07

## 2017-04-02 ENCOUNTER — Ambulatory Visit: Payer: BLUE CROSS/BLUE SHIELD | Admitting: Occupational Therapy

## 2017-04-02 ENCOUNTER — Encounter: Payer: Self-pay | Admitting: Occupational Therapy

## 2017-04-02 DIAGNOSIS — M6281 Muscle weakness (generalized): Secondary | ICD-10-CM | POA: Diagnosis not present

## 2017-04-02 DIAGNOSIS — M79601 Pain in right arm: Secondary | ICD-10-CM | POA: Diagnosis not present

## 2017-04-02 NOTE — Therapy (Signed)
[facility] [address] [place], [place], [address] Phone: [phone]   Fax:  [phone]  Occupational Therapy Treatment  Patient Details  Name: Yolanda Espinoza MRN: [id number] Date of Birth: Jun 19, 1955 Referring Provider: Vicie Mutters PA-C   Encounter Date: 04/02/2017  OT End of Session - 04/02/17 1711    Visit Number  5    Number of Visits  9    Date for OT Re-Evaluation  04/05/17    Authorization Type  BC/BS    OT Start Time  9381    OT Stop Time  1619    OT Time Calculation (min)  47 min    Activity Tolerance  Patient tolerated treatment well       Past Medical History:  Diagnosis Date  . Anemia   . Arthritis   . Chronic diastolic heart failure ([facility])   . GERD (gastroesophageal reflux disease)   . Heart murmur   . Hyperlipidemia   . Hypertension may, 1973  . Mitral regurgitation    a. Echo 01/2012 mild LVH, EF 55-65%, Gr 2 DD, mod MR, mild LAE, PASP 36;  b. Echo (02/2013):  EF 55-60%, Gr 1 DD, mild to mod MR, mild LAE (no sig change since prior echo) // c. Echo 10/18: EF 55-60, normal wall motion, grade 2 diastolic dysfunction, trivial AI, mild to moderate MR, mild LAE, trivial PI, PASP 38    . OSA on CPAP    7 yrs  . Peripheral vascular disease ([facility])    cerebral aneursym  . Seizures ([facility]) 01/2017  . Stroke ([facility]) 07   no weakness  . Subarachnoid hemorrhage due to ruptured aneurysm ([facility])    2003 - s/p coiling  . Unspecified vitamin D deficiency     Past Surgical History:  Procedure Laterality Date  . ABDOMINAL HYSTERECTOMY    . ANEURYSM COILING    . head surgery     aneurysm ruptured- stroke  . IR 3D INDEPENDENT WKST  09/04/2016  . IR ANGIO INTRA EXTRACRAN SEL COM CAROTID INNOMINATE BILAT MOD SED  09/04/2016  . IR ANGIO INTRA EXTRACRAN SEL INTERNAL CAROTID UNI L MOD SED  02/21/2017  . IR ANGIO VERTEBRAL SEL VERTEBRAL BILAT MOD SED  09/04/2016  . IR ANGIOGRAM EXTREMITY LEFT  09/04/2016  . IR  ANGIOGRAM FOLLOW UP STUDY  02/21/2017  . IR NEURO EACH ADD'L AFTER BASIC UNI LEFT (MS)  02/21/2017  . IR RADIOLOGIST EVAL & MGMT  10/11/2016  . IR RADIOLOGIST EVAL & MGMT  03/07/2017  . IR TRANSCATH/EMBOLIZ  02/21/2017  . RADIOLOGY WITH ANESTHESIA N/A 04/23/2013   Procedure: RADIOLOGY WITH ANESTHESIA;  Surgeon: Rob Hickman, MD;  Location: [facility];  Service: Radiology;  Laterality: N/A;  . RADIOLOGY WITH ANESTHESIA N/A 10/30/2016   Procedure: EMBOLIZATION;  Surgeon: Luanne Bras, MD;  Location: [facility];  Service: Radiology;  Laterality: N/A;  . RADIOLOGY WITH ANESTHESIA N/A 02/21/2017   Procedure: EMBOLIZATION;  Surgeon: Luanne Bras, MD;  Location: [facility];  Service: Radiology;  Laterality: N/A;  . TONSILLECTOMY AND ADENOIDECTOMY    . TUBAL LIGATION      There were no vitals filed for this visit.  Subjective Assessment - 04/02/17 1536    Subjective   The pain in my arm is worse    Pertinent History  Dx: brain aneurysm 02/21/17 with stent procedure followed by AMS and seizure. PMH: HTN, CVA, heart murmur, h/o brain aneurysm in 2001    Limitations  light duty,  does not drive (Premorbid)    Patient Stated Goals  Get the strength back in my Rt arm    Currently in Pain?  Yes    Pain Score  7     Pain Location  Arm    Pain Orientation  Right    Pain Descriptors / Indicators  Aching like a toothache    Pain Type  Chronic pain    Pain Onset  More than a month ago    Pain Frequency  Constant    Aggravating Factors   when I wake up, after I go to work - I think when I use it too much    Pain Relieving Factors  heat, rest                   OT Treatments/Exercises (OP) - 04/02/17 0001      ADLs   ADL Comments  Discussed causes of pain with pt - reinforced positioning when sleeping at night and instructed pt in ice massage.  Also discussed with pt referral to rehab medicine to address shoulder pain while participating in therapy - pt has had shoulder pain since beginning of  February per chart.  Pt in agreement and states she will get referral from her primary care. WIll send message to Dr. Letta Pate, PM & R.  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       Neurological Re-education Exercises   Other Exercises 1  Neuro re ed in supine, sidelying and sitting to address more normal reach pattern for mid reach followed by resistance and holding in low to mid ranges to increase stabilization of shoulder girdle.  By end of session pain went from 7/10 to 3/10.        Modalities   Modalities  Cryotherapy      Cryotherapy   Number Minutes Cryotherapy  5 Minutes    Cryotherapy Location  Shoulder    Type of Cryotherapy  Ice massage instructed pt in ice massage      Manual Therapy   Manual Therapy  Soft tissue mobilization;Scapular mobilization    Manual therapy comments  soft tissue and scap mob to improve malalignment of shoulder girdle, reduce pain and reduce tightness. Pt with significant tightness in shoulder elevators due to active hiking when using RUE functionally;  scapula is adducted, downwardly rotated and sits in depression until pt attempts to reach overhead - pt then hikes and elevates scapula.  Pt also with point tenderness over deltoid tendon.               OT Education - 04/02/17 1709    Education provided  Yes    Education Details  ice massage, reasons for shoulder pain, referral to PM & R for shoulder pain    Person(s) Educated  Patient    Methods  Explanation;Demonstration;Verbal cues    Comprehension  Verbalized understanding;Returned demonstration          OT Long Term Goals - 04/02/17 1710      OT LONG TERM GOAL #1   Title  Pt will be independent with RUE HEP    Status  On-going      OT LONG TERM GOAL #2   Title  Pt will improve grip strength Rt hand to 60 lbs     Status  On-going      OT LONG TERM GOAL #  3   Title  Pt  will verbalize understanding with pain reduction strategies for Rt arm    Status  On-going      OT LONG TERM GOAL #4   Title  Pt will perform overhead reaching activities for 5 min. w/o rest    Status  On-going            Plan - 04/02/17 1710    Clinical Impression Statement  Pt with significant pain today in R shoulder - see PN details.     Occupational Profile and client history currently impacting functional performance  PMH: HTN, CVA, heart murmur, brain aneurysm 2001 x 3    Occupational performance deficits (Please refer to evaluation for details):  IADL's;Leisure;Social Participation;Work    Rehab Potential  Good    OT Frequency  2x / week    OT Duration  8 weeks    OT Treatment/Interventions  Moist Heat;DME and/or AE instruction;Therapeutic activities;Therapeutic exercise;Neuromuscular education;Passive range of motion;Manual Therapy;Patient/family education;Energy conservation    Plan  continue to address right shoulder pain, NMR and functional use,    Consulted and Agree with Plan of Care  Patient       Patient will benefit from skilled therapeutic intervention in order to improve the following deficits and impairments:  Decreased coordination, Decreased range of motion, Impaired flexibility, Decreased endurance, Impaired sensation, Impaired UE functional use, Pain, Decreased strength, Decreased activity tolerance  Visit Diagnosis: Pain in right arm  Muscle weakness (generalized)    Problem List Patient Active Problem List   Diagnosis Date Noted  . Seizure ([facility])   . Gout 10/23/2014  . Obesity 04/01/2014  . Prediabetes 11/11/2013  . Medication management 11/11/2013  . Brain aneurysm 04/23/2013  . OSA on CPAP   . Vitamin D deficiency   . Hyperlipidemia   . Mitral regurgitation 06/25/2012  . Chronic diastolic heart failure ([place]) 06/25/2012  . Essential hypertension 02/14/2012    Forde Radon [facility] 04/02/2017, 5:13 PM  [facility] [address] [place], [place], [address] Phone: [phone]   Fax:  [phone]  Name: UMI MAINOR MRN: [id number] Date of Birth: [birth year]

## 2017-04-03 NOTE — Progress Notes (Signed)
6 month OV  Assessment and Plan: Essential hypertension - LONG discussion with patient, she would like to try to avoid another medicaiton- she will get on her CPAP- tubing and mask given to patient at the visit- she will monitor at home - continue medications, DASH diet, exercise and monitor at home. Call if greater than 130/80.  - CBC with Differential/Platelet - BASIC METABOLIC PANEL WITH GFR - Hepatic function panel - TSH  Hyperlipidemia -continue medications, check lipids, decrease fatty foods, increase activity. - Lipid panel  Obesity Obesity with co morbidities- long discussion about weight loss, diet, and exercise  Chronic diastolic heart failure decrease salt/sugar, monitor weight, BP stable.   Vitamin D deficiency - Vit D  25 hydroxy (rtn osteoporosis monitoring)  Medication management - Magnesium  OSA on CPAP  weight loss advised.  Very important with heart/CVA history to GET on CPAP  CVA with right shoulder pain/weakness Will refer to PT  Discussed med's effects and SE's. Screening labs and tests as requested with regular follow-up as recommended. Over 30 minutes of exam, counseling, chart review, and complex, high level critical decision making was performed this visit.  Future Appointments  Date Time Provider [place]  04/11/2017  1:15 PM Carey Bullocks, [place] [facility]  04/13/2017 11:45 AM Hulda Marin, OT [facility]  09/25/2017  9:00 AM Leta Baptist, Earlean Polka, MD [facility] None  01/21/2018 10:00 AM Vicie Mutters, PA-C [facility] None    HPI  62 y.o. AA female  presents for follow up OV for PVD, CVA, HTN, chol.   Her blood pressure has not been checked at home, she states last time checked was before the surgery at home, she states then it was 120's, she takes all of he meds at night, she is on lasix 40 AS NEEDED AND DOES NOT TAKE OFTEN, norvasc 10mg , benciar 40mg , today their BP is BP: 140/90. SHE IS NOT ON HER CPAP.  She does workout,  walks daily for 30 mins. She denies chest pain, shortness of breath, dizziness.    She was seen for left ICA aneursym for a pipeline stent but had a seizure and AMS after her procedure, she is on brilinta, following with Dr. Corena Pilgrim, she is still on Keppra. She states she is doing well with it now. She continues to have right arm pain/weakness and wants referral to rehab.   She works at [employer], going to work 2 more years and retire. 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  She is not on cholesterol medication and denies myalgias. Her cholesterol is at goal. The cholesterol last visit was:   Lab Results  Component Value Date   CHOL 192 01/05/2017   HDL 141 01/05/2017   LDLCALC 31 01/05/2017   TRIG 110 02/21/2017   CHOLHDL 1.4 01/05/2017   She has been working on diet and exercise for prediabetes, and denies paresthesia of the feet, polydipsia, polyuria and visual disturbances. Last A1C in the office was:  Lab Results  Component Value Date   HGBA1C 5.3 01/05/2017   She has OSA and is on CPAP, she is on protonix for GERD.  Patient is NOT on allopurinol for gout, she is on colchicine and does not report a recent flare.  Lab Results  Component Value Date   LABURIC 4.2 10/23/2014   BMI is Body mass index is 30.01 kg/m., she is working on diet and exercise. Wt Readings from Last 3 Encounters:  04/05/17 215 lb 3.2  oz (97.6 kg)  03/19/17 214 lb 9.6 oz (97.3 kg)  02/28/17 210 lb (95.3 kg)   Patient is on Vitamin D supplement.   Lab Results  Component Value Date   VD25OH 27 (L) 01/05/2017      Current Medications:  Current Outpatient Medications on File Prior to Visit  Medication Sig Dispense Refill  . amLODipine (NORVASC) 10 MG tablet Take 1 tablet (10 mg total) by mouth daily. 90 tablet 0  . aspirin EC 81 MG tablet Take 1 tablet (81 mg total) by mouth daily.    . Calcium Carb-Cholecalciferol (CALCIUM 600 + D PO) Take 1 tablet by mouth  daily.    . cyclobenzaprine (FLEXERIL) 10 MG tablet Take 1 tablet (10 mg total) by mouth at bedtime as needed for muscle spasms. 90 tablet 0  . estrogens, conjugated, (PREMARIN) 0.9 MG tablet TAKE 1 TABLET (0.9mg ) BY MOUTH ONCE DAILY. 90 tablet 1  . ferrous sulfate 325 (65 FE) MG tablet Take 325 mg by mouth daily with breakfast.    . furosemide (LASIX) 40 MG tablet Take 1 tablet (40 mg total) by mouth daily as needed for edema. 90 tablet 0  . levETIRAcetam (KEPPRA) 1000 MG tablet Take 1 tablet (1,000 mg total) by mouth 2 (two) times daily. 180 tablet 4  . magnesium gluconate (MAGONATE) 500 MG tablet Take 500 mg by mouth daily.    . Multiple Vitamins-Minerals (MULTIVITAMIN WITH MINERALS) tablet Take 1 tablet by mouth daily.    Marland Kitchen olmesartan (BENICAR) 40 MG tablet Take 1 tablet (40 mg total) by mouth daily. 90 tablet 0  . Omega-3 Fatty Acids (FISH OIL) 1200 MG CAPS Take 2,400 mg by mouth daily.     . ticagrelor (BRILINTA) 90 MG TABS tablet Take 1 tablet (90 mg total) by mouth 2 (two) times daily. 90 tablet 1  . vitamin B-12 (CYANOCOBALAMIN) 1000 MCG tablet Take 1,000 mcg by mouth daily.     No current facility-administered medications on file prior to visit.     Medical History:  Past Medical History:  Diagnosis Date  . Anemia   . Arthritis   . Chronic diastolic heart failure ([place])   . GERD (gastroesophageal reflux disease)   . Heart murmur   . Hyperlipidemia   . Hypertension may, 1973  . Mitral regurgitation    a. Echo 01/2012 mild LVH, EF 55-65%, Gr 2 DD, mod MR, mild LAE, PASP 36;  b. Echo (02/2013):  EF 55-60%, Gr 1 DD, mild to mod MR, mild LAE (no sig change since prior echo) // c. Echo 10/18: EF 55-60, normal wall motion, grade 2 diastolic dysfunction, trivial AI, mild to moderate MR, mild LAE, trivial PI, PASP 38    . OSA on CPAP    7 yrs  . Peripheral vascular disease ([place])    cerebral aneursym  . Seizures ([place]) 01/2017  . Stroke ([place]) 07   no weakness  . Subarachnoid hemorrhage  due to ruptured aneurysm ([place])    2003 - s/p coiling  . Unspecified vitamin D deficiency    Allergies Allergies  Allergen Reactions  . Ace Inhibitors Nausea And Vomiting  . Aspirin Nausea And Vomiting    Can take coated Aspirin   Surgical History: reviewed and unchanged Family History: reviewed and unchanged Social History: reviewed and unchanged  Review of Systems: Review of Systems  Constitutional: Negative.   HENT: Negative.   Respiratory: Negative for cough, hemoptysis, sputum production, shortness of breath and wheezing.   Cardiovascular: Negative.  Negative for chest pain, palpitations and leg swelling (resolved).  Gastrointestinal: Negative for abdominal pain, blood in stool, constipation, diarrhea, heartburn, melena, nausea and vomiting.  Genitourinary: Negative.   Musculoskeletal: Negative for back pain, falls, joint pain, myalgias and neck pain.  Skin: Negative.   Neurological: Negative.  Negative for dizziness and headaches.  Psychiatric/Behavioral: Negative.  Negative for depression, hallucinations, memory loss, substance abuse and suicidal ideas. The patient is not nervous/anxious and does not have insomnia.     Physical Exam: Estimated body mass index is 30.01 kg/m as calculated from the following:   Height as of this encounter: 5\' 11"  (1.803 m).   Weight as of this encounter: 215 lb 3.2 oz (97.6 kg). BP 140/90   Pulse 78   Temp 97.6 F (36.4 C)   Resp 14   Ht 5\' 11"  (1.803 m)   Wt 215 lb 3.2 oz (97.6 kg)   SpO2 97%   BMI 30.01 kg/m  General Appearance: Well nourished, in no apparent distress.  Eyes: PERRLA, EOMs, conjunctiva no swelling or erythema, normal fundi and vessels.  Sinuses: No Frontal/maxillary tenderness  ENT/Mouth: Ext aud canals clear, normal light reflex with TMs without erythema, bulging. Good dentition. No erythema, swelling, or exudate on post pharynx. Tonsils not swollen or erythematous. Hearing normal.  Neck: Supple, thyroid slightly  enlarged. No bruits  Respiratory: Respiratory effort normal, BS equal bilaterally without rales, rhonchi, wheezing or stridor.  Cardio: RRR with holosystolic murmur RSB, without rubs or gallops. Brisk peripheral pulses with minimal edema.  Chest: symmetric, with normal excursions and percussion.  Abdomen: Soft, nontender, no guarding, rebound, hernias, masses, or organomegaly.  Lymphatics: Non tender without lymphadenopathy.  Musculoskeletal: Full ROM all peripheral extremities,5/5 strength, and normal gait.  Skin: medial bilateral ankles with dark nontender rash.  Warm, dry without rashes, lesions, ecchymosis. Neuro: Cranial nerves intact, reflexes equal bilaterally. Normal muscle tone, no cerebellar symptoms. Sensation intact.  Psych: Awake and oriented X 3, normal affect, Insight and Judgment appropriate.    Vicie Mutters 4:35 PM [facility]

## 2017-04-04 ENCOUNTER — Ambulatory Visit: Payer: BLUE CROSS/BLUE SHIELD | Admitting: Occupational Therapy

## 2017-04-05 ENCOUNTER — Ambulatory Visit (INDEPENDENT_AMBULATORY_CARE_PROVIDER_SITE_OTHER): Payer: BLUE CROSS/BLUE SHIELD | Admitting: Physician Assistant

## 2017-04-05 ENCOUNTER — Encounter: Payer: Self-pay | Admitting: Physician Assistant

## 2017-04-05 VITALS — BP 140/90 | HR 78 | Temp 97.6°F | Resp 14 | Ht 71.0 in | Wt 215.2 lb

## 2017-04-05 DIAGNOSIS — I671 Cerebral aneurysm, nonruptured: Secondary | ICD-10-CM | POA: Diagnosis not present

## 2017-04-05 DIAGNOSIS — E785 Hyperlipidemia, unspecified: Secondary | ICD-10-CM

## 2017-04-05 DIAGNOSIS — I340 Nonrheumatic mitral (valve) insufficiency: Secondary | ICD-10-CM

## 2017-04-05 DIAGNOSIS — I10 Essential (primary) hypertension: Secondary | ICD-10-CM

## 2017-04-05 DIAGNOSIS — I5032 Chronic diastolic (congestive) heart failure: Secondary | ICD-10-CM | POA: Diagnosis not present

## 2017-04-05 DIAGNOSIS — R7303 Prediabetes: Secondary | ICD-10-CM

## 2017-04-05 DIAGNOSIS — Z79899 Other long term (current) drug therapy: Secondary | ICD-10-CM | POA: Diagnosis not present

## 2017-04-05 DIAGNOSIS — G5691 Unspecified mononeuropathy of right upper limb: Secondary | ICD-10-CM

## 2017-04-05 DIAGNOSIS — M792 Neuralgia and neuritis, unspecified: Secondary | ICD-10-CM

## 2017-04-05 DIAGNOSIS — R569 Unspecified convulsions: Secondary | ICD-10-CM

## 2017-04-05 NOTE — Patient Instructions (Addendum)
Get on CPAP, measure BP at home  Please pick one of the over the counter allergy medications below and take it once daily for allergies.  Claritin or loratadine cheapest but likely the weakest  Zyrtec or certizine at night because it can make you sleepy The strongest is allegra or fexafinadine  Cheapest at [employer], [employer], [employer]  Monitor your blood pressure at home, please keep a record and bring that in with you to your next office visit.   Go to the ER if any CP, SOB, nausea, dizziness, severe HA, changes vision/speech  Due to a recent study, SPRINT, we have changed our goal for the systolic or top blood pressure number. Ideally we want your top number at 120.  In the [facility] Trial, 5000 people were randomized to a goal BP of 120 and 5000 people were randomized to a goal BP of less than 140. The patients with the goal BP at 120 had LESS DEMENTIA, LESS HEART ATTACKS, AND LESS STROKES, AS WELL AS OVERALL DECREASED MORTALITY OR DEATH RATE.   If you are willing, our goal BP is the top number of 120.  Your most recent BP: BP: 140/90   Take your medications faithfully as instructed. Maintain a healthy weight. Get at least 150 minutes of aerobic exercise per week. Minimize salt intake. Minimize alcohol intake  DASH Eating Plan DASH stands for "Dietary Approaches to Stop Hypertension." The DASH eating plan is a healthy eating plan that has been shown to reduce high blood pressure (hypertension). Additional health benefits may include reducing the risk of type 2 diabetes mellitus, heart disease, and stroke. The DASH eating plan may also help with weight loss. WHAT DO I NEED TO KNOW ABOUT THE DASH EATING PLAN? For the DASH eating plan, you will follow these general guidelines:  Choose foods with a percent daily value for sodium of less than 5% (as listed on the food label).  Use salt-free seasonings or herbs instead of table salt or sea salt.  Check with your health care provider or  pharmacist before using salt substitutes.  Eat lower-sodium products, often labeled as "lower sodium" or "no salt added."  Eat fresh foods.  Eat more vegetables, fruits, and low-fat dairy products.  Choose whole grains. Look for the word "whole" as the first word in the ingredient list.  Choose fish and skinless chicken or [place] more often than red meat. Limit fish, poultry, and meat to 6 oz (170 g) each day.  Limit sweets, desserts, sugars, and sugary drinks.  Choose heart-healthy fats.  Limit cheese to 1 oz (28 g) per day.  Eat more home-cooked food and less restaurant, buffet, and fast food.  Limit fried foods.  Cook foods using methods other than frying.  Limit canned vegetables. If you do use them, rinse them well to decrease the sodium.  When eating at a restaurant, ask that your food be prepared with less salt, or no salt if possible. WHAT FOODS CAN I EAT? Seek help from a dietitian for individual calorie needs. Grains Whole grain or whole wheat bread. Brown rice. Whole grain or whole wheat pasta. Quinoa, bulgur, and whole grain cereals. Low-sodium cereals. Corn or whole wheat flour tortillas. Whole grain cornbread. Whole grain crackers. Low-sodium crackers. Vegetables Fresh or frozen vegetables (raw, steamed, roasted, or grilled). Low-sodium or reduced-sodium tomato and vegetable juices. Low-sodium or reduced-sodium tomato sauce and paste. Low-sodium or reduced-sodium canned vegetables.  Fruits All fresh, canned (in natural juice), or frozen fruits. Meat and Other  Protein Products Ground beef (85% or leaner), grass-fed beef, or beef trimmed of fat. Skinless chicken or [place]. Ground chicken or [place]. Pork trimmed of fat. All fish and seafood. Eggs. Dried beans, peas, or lentils. Unsalted nuts and seeds. Unsalted canned beans. Dairy Low-fat dairy products, such as skim or 1% milk, 2% or reduced-fat cheeses, low-fat ricotta or cottage cheese, or plain low-fat yogurt.  Low-sodium or reduced-sodium cheeses. Fats and Oils Tub margarines without trans fats. Light or reduced-fat mayonnaise and salad dressings (reduced sodium). Avocado. Safflower, olive, or canola oils. Natural peanut or almond butter. Other Unsalted popcorn and pretzels. The items listed above may not be a complete list of recommended foods or beverages. Contact your dietitian for more options. WHAT FOODS ARE NOT RECOMMENDED? Grains White bread. White pasta. White rice. Refined cornbread. Bagels and croissants. Crackers that contain trans fat. Vegetables Creamed or fried vegetables. Vegetables in a cheese sauce. Regular canned vegetables. Regular canned tomato sauce and paste. Regular tomato and vegetable juices. Fruits Dried fruits. Canned fruit in light or heavy syrup. Fruit juice. Meat and Other Protein Products Fatty cuts of meat. Ribs, chicken wings, bacon, sausage, bologna, salami, chitterlings, fatback, hot dogs, bratwurst, and packaged luncheon meats. Salted nuts and seeds. Canned beans with salt. Dairy Whole or 2% milk, cream, half-and-half, and cream cheese. Whole-fat or sweetened yogurt. Full-fat cheeses or blue cheese. Nondairy creamers and whipped toppings. Processed cheese, cheese spreads, or cheese curds. Condiments Onion and garlic salt, seasoned salt, table salt, and sea salt. Canned and packaged gravies. Worcestershire sauce. Tartar sauce. Barbecue sauce. Teriyaki sauce. Soy sauce, including reduced sodium. Steak sauce. Fish sauce. Oyster sauce. Cocktail sauce. Horseradish. Ketchup and mustard. Meat flavorings and tenderizers. Bouillon cubes. Hot sauce. Tabasco sauce. Marinades. Taco seasonings. Relishes. Fats and Oils Butter, stick margarine, lard, shortening, ghee, and bacon fat. Coconut, palm kernel, or palm oils. Regular salad dressings. Other Pickles and olives. Salted popcorn and pretzels. The items listed above may not be a complete list of foods and beverages to avoid.  Contact your dietitian for more information. WHERE CAN I FIND MORE INFORMATION? National Heart, Lung, and Blood Institute: travelstabloid.com Document Released: 12/29/2010 Document Revised: 05/26/2013 Document Reviewed: 11/13/2012 [facility] Patient Information 2015 [place], [place]. This information is not intended to replace advice given to you by your health care provider. Make sure you discuss any questions you have with your health care provider.   Stasis Dermatitis Stasis dermatitis is a long-term (chronic) skin condition that happens when veins can no longer pump blood back to the heart (poor circulation). This condition causes a red or brown scaly rash or sores (ulcers) from the pooling of blood (stasis). This condition usually affects the lower legs. It may affect one leg or both legs. Without treatment, severe stasis dermatitis can lead to other skin conditions and infections. What are the causes? This condition is caused by poor circulation. What increases the risk? This condition is more likely to develop in people who:  Are not very active.  Stand for long periods of time.  Have veins that have become enlarged and twisted (varicose veins).  Have leg veins that are not strong enough to send blood back to the heart (venous insufficiency).  Have had a blood clot.  Have been pregnant many times.  Have had vein surgery.  Are obese.  Have heart or kidney failure.  Are 53 years of age or older.  What are the signs or symptoms? Common early symptoms of this condition include:  Swelling  in your ankle or leg. This might get better overnight but be worse again in the day.  Skin that looks thin on your ankle and leg.  Ow[place] Shark marks that develop slowly.  Skin that is easily irritated or cracked.  Red, swollen skin.  An achy or heavy feeling after you walk or stand for long periods of time.  Pain.  Later and more severe symptoms of this  condition include:  Skin that looks shiny.  Small, open sores (ulcers). These are often red or purple.  Dry, cracking skin.  Skin that feels hard.  Severe itching.  A change in the shape or color of your lower legs.  Severe pain.  Difficulty walking.  How is this diagnosed? Your health care provider may suspect this condition from your symptoms and medical history. Your health care provider will also do a physical exam. You may need to see a health care provider who specializes in skin diseases (dermatologist). You may also have tests to confirm the diagnosis, including:  Blood tests.  Imaging studies to check blood flow (Doppler ultrasound).  Allergy tests.  How is this treated? Treatment for this condition may include medicine, such as:  Corticosteroid creams and ointments.  Non-corticosteroid medicines applied to the skin (topical).  Medicine to reduce swelling in the legs (diuretics).  Antibiotics.  Medicine to relieve itching (antihistamines).  You may also have to wear:  Compression stockings or an elastic wrap to improve circulation.  A bandage (dressing).  A wrap that contains zinc and gelatin (Unna boot).  Follow these instructions at home: [place] your skin as told by your health care provider. Do not use moisturizers with fragrance. This can irritate your skin.  Apply cool compresses to the affected areas.  Do not scratch your skin.  Do not rub your skin dry after a bath or shower. Gently pat your skin dry.  Do not use scented soaps, detergents, or perfumes. Medicines  Take or use over-the-counter and prescription medicines only as told by your health care provider.  If you were prescribed an antibiotic medicine, take or use it as told by your health care provider. Do not stop taking or using the antibiotic even if your condition starts to improve. Lifestyle  Do not stand or sit in one position for long periods of time.  Do  not cross your legs when you sit.  Raise (elevate) your legs above the level of your heart when you are sitting or lying down.  Walk as told by your health care provider. Walking increases blood flow.  Wear comfortable, loose-fitting clothing. Circulation in your legs will be worse if you wear tight pants, belts, and waistbands. General instructions  Change and remove any dressing as told by your health care provider, if this applies.  Wear compression stockings as told by your health care provider, if this applies. These stockings help to prevent blood clots and reduce swelling in your legs.  Wear the [employer] as told by your health care provider, if this applies.  Keep all follow-up visits as told by your health care provider. This is important. Contact a health care provider if:  Your condition does not improve with treatment.  Your condition gets worse.  You have signs of infection in the affected area. Watch for: ? Swelling. ? Tenderness. ? Redness. ? Soreness. ? Warmth.  You have a fever. Get help right away if:  You notice red streaks coming from the affected area.  Your bone  or joint underneath the affected area becomes painful after the skin has healed.  The affected area turns darker.  You feel a deep pain in your leg or groin.  You are short of breath. This information is not intended to replace advice given to you by your health care provider. Make sure you discuss any questions you have with your health care provider. Document Released: 04/20/2005 Document Revised: 09/07/2015 Document Reviewed: 05/27/2014 Elsevier Interactive Patient Education  [employer].

## 2017-04-06 LAB — LIPID PANEL
CHOL/HDL RATIO: 1.8 (calc) (ref <5.0)
CHOLESTEROL: 181 mg/dL (ref <200)
HDL: 99 mg/dL (ref >50)
LDL CHOLESTEROL (CALC): 56 mg/dL
NON-HDL CHOLESTEROL (CALC): 82 mg/dL (ref <130)
TRIGLYCERIDES: 191 mg/dL — AB (ref <150)

## 2017-04-06 LAB — BASIC METABOLIC PANEL WITH GFR
BUN / CREAT RATIO: 17 (calc) (ref 6–22)
BUN: 21 mg/dL (ref 7–25)
CHLORIDE: 105 mmol/L (ref 98–110)
CO2: 26 mmol/L (ref 20–32)
Calcium: 9.3 mg/dL (ref 8.6–10.4)
Creat: 1.21 mg/dL — ABNORMAL HIGH (ref 0.50–0.99)
GFR, EST AFRICAN AMERICAN: 56 mL/min/{1.73_m2} — AB (ref >=60)
GFR, Est Non African American: 48 mL/min/{1.73_m2} — ABNORMAL LOW (ref >=60)
GLUCOSE: 84 mg/dL (ref 65–99)
POTASSIUM: 4.1 mmol/L (ref 3.5–5.3)
Sodium: 138 mmol/L (ref 135–146)

## 2017-04-06 LAB — HEPATIC FUNCTION PANEL
AG Ratio: 1.2 (calc) (ref 1.0–2.5)
ALKALINE PHOSPHATASE (APISO): 55 U/L (ref 33–130)
ALT: 8 U/L (ref 6–29)
AST: 15 U/L (ref 10–35)
Albumin: 4.1 g/dL (ref 3.6–5.1)
BILIRUBIN DIRECT: 0 mg/dL (ref 0.0–0.2)
BILIRUBIN INDIRECT: 0.2 mg/dL (ref 0.2–1.2)
Globulin: 3.4 g/dL (calc) (ref 1.9–3.7)
TOTAL PROTEIN: 7.5 g/dL (ref 6.1–8.1)
Total Bilirubin: 0.2 mg/dL (ref 0.2–1.2)

## 2017-04-06 LAB — CBC WITH DIFFERENTIAL/PLATELET
Basophils Absolute: 38 cells/uL (ref 0–200)
Basophils Relative: 0.6 %
Eosinophils Absolute: 208 cells/uL (ref 15–500)
Eosinophils Relative: 3.3 %
HCT: 29 % — ABNORMAL LOW (ref 35.0–45.0)
Hemoglobin: 9.8 g/dL — ABNORMAL LOW (ref 11.7–15.5)
Lymphs Abs: 2570 cells/uL (ref 850–3900)
MCH: 27.3 pg (ref 27.0–33.0)
MCHC: 33.8 g/dL (ref 32.0–36.0)
MCV: 80.8 fL (ref 80.0–100.0)
MONOS PCT: 5.3 %
MPV: 10 fL (ref 7.5–12.5)
NEUTROS PCT: 50 %
Neutro Abs: 3150 cells/uL (ref 1500–7800)
PLATELETS: 265 10*3/uL (ref 140–400)
RBC: 3.59 10*6/uL — AB (ref 3.80–5.10)
RDW: 14.6 % (ref 11.0–15.0)
TOTAL LYMPHOCYTE: 40.8 %
WBC mixed population: 334 cells/uL (ref 200–950)
WBC: 6.3 10*3/uL (ref 3.8–10.8)

## 2017-04-06 LAB — TSH: TSH: 2.52 mIU/L (ref 0.40–4.50)

## 2017-04-11 ENCOUNTER — Ambulatory Visit: Payer: BLUE CROSS/BLUE SHIELD | Admitting: Occupational Therapy

## 2017-04-11 DIAGNOSIS — M79601 Pain in right arm: Secondary | ICD-10-CM | POA: Diagnosis not present

## 2017-04-11 DIAGNOSIS — M6281 Muscle weakness (generalized): Secondary | ICD-10-CM

## 2017-04-11 NOTE — Therapy (Signed)
[facility] [address] [place], [place], [address] Phone: [phone]   Fax:  [phone]  Occupational Therapy RENEWAL Patient Details  Name: Yolanda Espinoza MRN: [id number] Date of Birth: February 21, 1955 Referring Provider: Vicie Mutters PA-C   Encounter Date: 04/11/2017  OT End of Session - 04/11/17 1423    Visit Number  1    Number of Visits  10    Date for OT Re-Evaluation  05/11/17    Authorization Type  BC/BS    Authorization Time Period  WEEK 1/5    OT Start Time  1315    OT Stop Time  1402    OT Time Calculation (min)  47 min    Activity Tolerance  Patient tolerated treatment well    Behavior During Therapy  [facility] for tasks assessed/performed       Past Medical History:  Diagnosis Date  . Anemia   . Arthritis   . Chronic diastolic heart failure ([place])   . GERD (gastroesophageal reflux disease)   . Heart murmur   . Hyperlipidemia   . Hypertension may, 1973  . Mitral regurgitation    a. Echo 01/2012 mild LVH, EF 55-65%, Gr 2 DD, mod MR, mild LAE, PASP 36;  b. Echo (02/2013):  EF 55-60%, Gr 1 DD, mild to mod MR, mild LAE (no sig change since prior echo) // c. Echo 10/18: EF 55-60, normal wall motion, grade 2 diastolic dysfunction, trivial AI, mild to moderate MR, mild LAE, trivial PI, PASP 38    . OSA on CPAP    7 yrs  . Peripheral vascular disease ([place])    cerebral aneursym  . Seizures ([place]) 01/2017  . Stroke ([place]) 07   no weakness  . Subarachnoid hemorrhage due to ruptured aneurysm ([place])    2003 - s/p coiling  . Unspecified vitamin D deficiency     Past Surgical History:  Procedure Laterality Date  . ABDOMINAL HYSTERECTOMY    . ANEURYSM COILING    . head surgery     aneurysm ruptured- stroke  . IR 3D INDEPENDENT WKST  09/04/2016  . IR ANGIO INTRA EXTRACRAN SEL COM CAROTID INNOMINATE BILAT MOD SED  09/04/2016  . IR ANGIO INTRA EXTRACRAN SEL INTERNAL CAROTID UNI L MOD SED  02/21/2017  . IR ANGIO  VERTEBRAL SEL VERTEBRAL BILAT MOD SED  09/04/2016  . IR ANGIOGRAM EXTREMITY LEFT  09/04/2016  . IR ANGIOGRAM FOLLOW UP STUDY  02/21/2017  . IR NEURO EACH ADD'L AFTER BASIC UNI LEFT (MS)  02/21/2017  . IR RADIOLOGIST EVAL & MGMT  10/11/2016  . IR RADIOLOGIST EVAL & MGMT  03/07/2017  . IR TRANSCATH/EMBOLIZ  02/21/2017  . RADIOLOGY WITH ANESTHESIA N/A 04/23/2013   Procedure: RADIOLOGY WITH ANESTHESIA;  Surgeon: Rob Hickman, MD;  Location: [facility];  Service: Radiology;  Laterality: N/A;  . RADIOLOGY WITH ANESTHESIA N/A 10/30/2016   Procedure: EMBOLIZATION;  Surgeon: Luanne Bras, MD;  Location: [facility];  Service: Radiology;  Laterality: N/A;  . RADIOLOGY WITH ANESTHESIA N/A 02/21/2017   Procedure: EMBOLIZATION;  Surgeon: Luanne Bras, MD;  Location: [facility];  Service: Radiology;  Laterality: N/A;  . TONSILLECTOMY AND ADENOIDECTOMY    . TUBAL LIGATION      There were no vitals filed for this visit.  Subjective Assessment - 04/11/17 1405    Subjective   My shoulder feels better today. It only hurts now when I move it higher    Pertinent History  Dx: brain aneurysm  02/21/17 with stent procedure followed by AMS and seizure. PMH: HTN, CVA, heart murmur, h/o brain aneurysm in 2001    Limitations  light duty, does not drive (Premorbid)    Patient Stated Goals  Get the strength back in my Rt arm    Currently in Pain?  Yes    Pain Score  5     Pain Location  Shoulder    Pain Orientation  Right    Pain Descriptors / Indicators  Aching    Pain Type  Chronic pain    Pain Onset  More than a month ago    Pain Frequency  Intermittent    Aggravating Factors   only with overhead reaching     Pain Relieving Factors  ice, rest                    OT Treatments/Exercises (OP) - 04/11/17 0001      ADLs   ADL Comments  Assessed shoulder further - pt point tender over middle deltoid, question tendonitis. Also noted pt to go into slight abduction when attempting sh. flexion and tenses arm in  slight abduction even at rest at times. Assessed/revised goals for renewal      Neurological Re-education Exercises   Scapular Stabilization  Bilateral;Prone with scapula retraction    Other Information       Other Exercises 1  Training in proper shoulder flexion w/o sh. abd;  and sh. abduction w/o sh. IR      Cryotherapy   Number Minutes Cryotherapy  8 Minutes    Cryotherapy Location  Shoulder    Type of Cryotherapy  Ice massage at middle deltoid      Manual Therapy   Manual Therapy  Taping    Kinesiotex  Inhibit Muscle at middle deltoid and upper traps             OT Education - 04/11/17 1422    Education provided  Yes    Education Details  ice massage, kinesiotape wear and care, proper positioning with reaching, scapula retraction in prone    Person(s) Educated  Patient    Methods  Explanation;Demonstration    Comprehension  Verbalized understanding;Returned demonstration          OT Long Term Goals - 04/11/17 1426      OT LONG TERM GOAL #1   Title  Pt will be independent with RUE HEP    Time  5    Status  Revised      OT LONG TERM GOAL #2   Title  Pt will improve grip strength Rt hand to 60 lbs     Status  Achieved 04/11/17 = 60 LBS      OT LONG TERM GOAL #3   Title  Pt will report pain 3/10 or under for overhead reaching RUE    Baseline  5/10 - 10/10    Time  5    Period  Weeks    Status  New      OT LONG TERM GOAL #4   Title  Pt will return to overhead work tasks w/ rest breaks prn     Time  5    Period  Weeks    Status  New            Plan - 04/11/17 1429    Clinical Impression Statement  Pt with decreased overall pain in Rt shoulder today, but pain 5/10 in high level sh. flexion. Renewal completed today  Occupational Profile and client history currently impacting functional performance  PMH: HTN, CVA, heart murmur, brain aneurysm 2001 x 3    Occupational performance deficits (Please refer to evaluation for details):  IADL's;Leisure;Social  Participation;Work    Rehab Potential  Good    OT Frequency  2x / week    OT Duration  -- 5 weeks    OT Treatment/Interventions  Moist Heat;DME and/or AE instruction;Therapeutic activities;Therapeutic exercise;Neuromuscular education;Passive range of motion;Manual Therapy;Patient/family education;Energy conservation    Plan  assess taping, continue to address right shoulder pain, NMR and functional use, and modalities prn    Consulted and Agree with Plan of Care  Patient       Patient will benefit from skilled therapeutic intervention in order to improve the following deficits and impairments:  Decreased coordination, Decreased range of motion, Impaired flexibility, Decreased endurance, Impaired sensation, Impaired UE functional use, Pain, Decreased strength, Decreased activity tolerance  Visit Diagnosis: Pain in right arm - Plan: Ot plan of care cert/re-cert  Muscle weakness (generalized) - Plan: Ot plan of care cert/re-cert    Problem List Patient Active Problem List   Diagnosis Date Noted  . Seizure ([place])   . Gout 10/23/2014  . Obesity 04/01/2014  . Prediabetes 11/11/2013  . Medication management 11/11/2013  . Brain aneurysm 04/23/2013  . OSA on CPAP   . Vitamin D deficiency   . Hyperlipidemia   . Mitral regurgitation 06/25/2012  . Chronic diastolic heart failure ([place]) 06/25/2012  . Essential hypertension 02/14/2012    Carey Bullocks, OTR/L 04/11/2017, 2:33 PM  [facility] [address] [place], [place], [address] Phone: [phone]   Fax:  [phone]  Name: HOLLYNN GARNO MRN: [id number] Date of Birth: Jan 16, 1956

## 2017-04-13 ENCOUNTER — Ambulatory Visit: Payer: BLUE CROSS/BLUE SHIELD | Admitting: Occupational Therapy

## 2017-04-13 DIAGNOSIS — M6281 Muscle weakness (generalized): Secondary | ICD-10-CM | POA: Diagnosis not present

## 2017-04-13 DIAGNOSIS — M79601 Pain in right arm: Secondary | ICD-10-CM

## 2017-04-13 NOTE — Therapy (Signed)
[facility] [address] [place], [place], [address] Phone: [phone]   Fax:  [phone]  Occupational Therapy Treatment  Patient Details  Name: Yolanda Espinoza MRN: [id number] Date of Birth: [birth year] Referring Provider: Vicie Mutters PA-C   Encounter Date: 04/13/2017  OT End of Session - 04/13/17 1155    Visit Number  2    Number of Visits  10    Date for OT Re-Evaluation  05/11/17    Authorization Type  BC/BS    Authorization Time Period  WEEK 1/5    Authorization - Visit Number  7 total visit count    Authorization - Number of Visits  15    OT Start Time  1152    OT Stop Time  1230    OT Time Calculation (min)  38 min    Activity Tolerance  Patient tolerated treatment well    Behavior During Therapy  [facility] for tasks assessed/performed       Past Medical History:  Diagnosis Date  . Anemia   . Arthritis   . Chronic diastolic heart failure ([place])   . GERD (gastroesophageal reflux disease)   . Heart murmur   . Hyperlipidemia   . Hypertension may, 1973  . Mitral regurgitation    a. Echo 01/2012 mild LVH, EF 55-65%, Gr 2 DD, mod MR, mild LAE, PASP 36;  b. Echo (02/2013):  EF 55-60%, Gr 1 DD, mild to mod MR, mild LAE (no sig change since prior echo) // c. Echo 10/18: EF 55-60, normal wall motion, grade 2 diastolic dysfunction, trivial AI, mild to moderate MR, mild LAE, trivial PI, PASP 38    . OSA on CPAP    7 yrs  . Peripheral vascular disease ([place])    cerebral aneursym  . Seizures ([place]) 01/2017  . Stroke ([place]) 07   no weakness  . Subarachnoid hemorrhage due to ruptured aneurysm ([place])    2003 - s/p coiling  . Unspecified vitamin D deficiency     Past Surgical History:  Procedure Laterality Date  . ABDOMINAL HYSTERECTOMY    . ANEURYSM COILING    . head surgery     aneurysm ruptured- stroke  . IR 3D INDEPENDENT WKST  09/04/2016  . IR ANGIO INTRA EXTRACRAN SEL COM CAROTID INNOMINATE BILAT MOD SED   09/04/2016  . IR ANGIO INTRA EXTRACRAN SEL INTERNAL CAROTID UNI L MOD SED  02/21/2017  . IR ANGIO VERTEBRAL SEL VERTEBRAL BILAT MOD SED  09/04/2016  . IR ANGIOGRAM EXTREMITY LEFT  09/04/2016  . IR ANGIOGRAM FOLLOW UP STUDY  02/21/2017  . IR NEURO EACH ADD'L AFTER BASIC UNI LEFT (MS)  02/21/2017  . IR RADIOLOGIST EVAL & MGMT  10/11/2016  . IR RADIOLOGIST EVAL & MGMT  03/07/2017  . IR TRANSCATH/EMBOLIZ  02/21/2017  . RADIOLOGY WITH ANESTHESIA N/A 04/23/2013   Procedure: RADIOLOGY WITH ANESTHESIA;  Surgeon: Rob Hickman, MD;  Location: [facility];  Service: Radiology;  Laterality: N/A;  . RADIOLOGY WITH ANESTHESIA N/A 10/30/2016   Procedure: EMBOLIZATION;  Surgeon: Luanne Bras, MD;  Location: [facility];  Service: Radiology;  Laterality: N/A;  . RADIOLOGY WITH ANESTHESIA N/A 02/21/2017   Procedure: EMBOLIZATION;  Surgeon: Luanne Bras, MD;  Location: [facility];  Service: Radiology;  Laterality: N/A;  . TONSILLECTOMY AND ADENOIDECTOMY    . TUBAL LIGATION      There were no vitals filed for this visit.  Subjective Assessment - 04/13/17 1153    Subjective  Pt reports pain at her elbow    Pertinent History  Dx: brain aneurysm 02/21/17 with stent procedure followed by AMS and seizure. PMH: HTN, CVA, heart murmur, h/o brain aneurysm in 2001    Limitations  light duty, does not drive (Premorbid)    Patient Stated Goals  Get the strength back in my Rt arm    Currently in Pain?  Yes    Pain Score  3     Pain Location  Elbow    Pain Orientation  Right    Pain Descriptors / Indicators  Aching    Pain Type  Chronic pain    Pain Onset  In the past 7 days    Pain Frequency  Intermittent    Aggravating Factors   certain movments only    Pain Relieving Factors  rest          Treatment: supine closed chain shoulder flexion and chest press with cane, min v.c Prone shoulder ext with RUE followed by bilateral shoulder ext and scapular retraction, min v.c Prone on elbows, lifting chest, then cat and cow,  min-mod v.c UE ranger for mid range shoulder flexion, min facilitation/ v.c Midrange functional reaching activities, min facilitation to avoid compensation.  Arm bike x 5 mins level 1 for conditioning as pt fatigues quickly.                      OT Long Term Goals - 04/11/17 1426      OT LONG TERM GOAL #1   Title  Pt will be independent with RUE HEP    Time  5    Status  Revised      OT LONG TERM GOAL #2   Title  Pt will improve grip strength Rt hand to 60 lbs     Status  Achieved 04/11/17 = 60 LBS      OT LONG TERM GOAL #3   Title  Pt will report pain 3/10 or under for overhead reaching RUE    Baseline  5/10 - 10/10    Time  5    Period  Weeks    Status  New      OT LONG TERM GOAL #4   Title  Pt will return to overhead work tasks w/ rest breaks prn     Time  5    Period  Weeks    Status  New            Plan - 04/13/17 1229    Clinical Impression Statement  Pt with decreased overall pain in Rt shoulder today, Pt reports  tape is helping.    Rehab Potential  Good    OT Frequency  2x / week    OT Duration  -- 5 weeks    OT Treatment/Interventions  Moist Heat;DME and/or AE instruction;Therapeutic activities;Therapeutic exercise;Neuromuscular education;Passive range of motion;Manual Therapy;Patient/family education;Energy conservation    Plan  assess taping, continue to address right shoulder pain, NMR and functional use, and modalities prn    Consulted and Agree with Plan of Care  Patient       Patient will benefit from skilled therapeutic intervention in order to improve the following deficits and impairments:  Decreased coordination, Decreased range of motion, Impaired flexibility, Decreased endurance, Impaired sensation, Impaired UE functional use, Pain, Decreased strength, Decreased activity tolerance  Visit Diagnosis: Pain in right arm  Muscle weakness (generalized)    Problem List Patient Active Problem List   Diagnosis Date Noted  .  Seizure ([place])   . Gout 10/23/2014  . Obesity 04/01/2014  . Prediabetes 11/11/2013  . Medication management 11/11/2013  . Brain aneurysm 04/23/2013  . OSA on CPAP   . Vitamin D deficiency   . Hyperlipidemia   . Mitral regurgitation 06/25/2012  . Chronic diastolic heart failure ([place]) 06/25/2012  . Essential hypertension 02/14/2012    Atley Scarboro 04/13/2017, 12:30 PM  [facility] [address] [place], [place], [address] Phone: [phone]   Fax:  [phone]  Name: LYNDI HOLBEIN MRN: [id number] Date of Birth: 26-Mar-1955

## 2017-04-17 ENCOUNTER — Ambulatory Visit: Payer: BLUE CROSS/BLUE SHIELD | Admitting: Occupational Therapy

## 2017-04-18 ENCOUNTER — Telehealth: Payer: Self-pay

## 2017-04-18 ENCOUNTER — Ambulatory Visit: Payer: BLUE CROSS/BLUE SHIELD | Admitting: Occupational Therapy

## 2017-04-18 NOTE — Telephone Encounter (Signed)
Called and left message re: missed O.T.  Appointments yesterday 04/17/17 and today 04/18/17. Reminded pt of next appointment on 04/25/17 and to call and cancel if she could not make it

## 2017-04-25 ENCOUNTER — Ambulatory Visit: Payer: BLUE CROSS/BLUE SHIELD | Attending: Internal Medicine | Admitting: Occupational Therapy

## 2017-04-25 DIAGNOSIS — M79601 Pain in right arm: Secondary | ICD-10-CM | POA: Diagnosis not present

## 2017-04-25 DIAGNOSIS — M6281 Muscle weakness (generalized): Secondary | ICD-10-CM | POA: Diagnosis not present

## 2017-04-25 NOTE — Therapy (Signed)
[facility] [address] [place], [place], [address] Phone: [phone]   Fax:  [phone]  Occupational Therapy Treatment  Patient Details  Name: Yolanda Espinoza MRN: [id number] Date of Birth: January 13, 1956 Referring Provider: Vicie Mutters PA-C   Encounter Date: 04/25/2017  OT End of Session - 04/25/17 1020    Visit Number  3    Number of Visits  10    Date for OT Re-Evaluation  05/11/17    Authorization Type  BC/BS    Authorization Time Period  week 2/5    Authorization - Visit Number  8    Authorization - Number of Visits  15    OT Start Time  0845    OT Stop Time  0930    OT Time Calculation (min)  45 min    Activity Tolerance  Patient tolerated treatment well    Behavior During Therapy  [facility] for tasks assessed/performed       Past Medical History:  Diagnosis Date  . Anemia   . Arthritis   . Chronic diastolic heart failure ([place])   . GERD (gastroesophageal reflux disease)   . Heart murmur   . Hyperlipidemia   . Hypertension may, 1973  . Mitral regurgitation    a. Echo 01/2012 mild LVH, EF 55-65%, Gr 2 DD, mod MR, mild LAE, PASP 36;  b. Echo (02/2013):  EF 55-60%, Gr 1 DD, mild to mod MR, mild LAE (no sig change since prior echo) // c. Echo 10/18: EF 55-60, normal wall motion, grade 2 diastolic dysfunction, trivial AI, mild to moderate MR, mild LAE, trivial PI, PASP 38    . OSA on CPAP    7 yrs  . Peripheral vascular disease ([place])    cerebral aneursym  . Seizures ([place]) 01/2017  . Stroke ([place]) 07   no weakness  . Subarachnoid hemorrhage due to ruptured aneurysm ([place])    2003 - s/p coiling  . Unspecified vitamin D deficiency     Past Surgical History:  Procedure Laterality Date  . ABDOMINAL HYSTERECTOMY    . ANEURYSM COILING    . head surgery     aneurysm ruptured- stroke  . IR 3D INDEPENDENT WKST  09/04/2016  . IR ANGIO INTRA EXTRACRAN SEL COM CAROTID INNOMINATE BILAT MOD SED  09/04/2016  . IR ANGIO  INTRA EXTRACRAN SEL INTERNAL CAROTID UNI L MOD SED  02/21/2017  . IR ANGIO VERTEBRAL SEL VERTEBRAL BILAT MOD SED  09/04/2016  . IR ANGIOGRAM EXTREMITY LEFT  09/04/2016  . IR ANGIOGRAM FOLLOW UP STUDY  02/21/2017  . IR NEURO EACH ADD'L AFTER BASIC UNI LEFT (MS)  02/21/2017  . IR RADIOLOGIST EVAL & MGMT  10/11/2016  . IR RADIOLOGIST EVAL & MGMT  03/07/2017  . IR TRANSCATH/EMBOLIZ  02/21/2017  . RADIOLOGY WITH ANESTHESIA N/A 04/23/2013   Procedure: RADIOLOGY WITH ANESTHESIA;  Surgeon: Rob Hickman, MD;  Location: [facility];  Service: Radiology;  Laterality: N/A;  . RADIOLOGY WITH ANESTHESIA N/A 10/30/2016   Procedure: EMBOLIZATION;  Surgeon: Luanne Bras, MD;  Location: [facility];  Service: Radiology;  Laterality: N/A;  . RADIOLOGY WITH ANESTHESIA N/A 02/21/2017   Procedure: EMBOLIZATION;  Surgeon: Luanne Bras, MD;  Location: [facility];  Service: Radiology;  Laterality: N/A;  . TONSILLECTOMY AND ADENOIDECTOMY    . TUBAL LIGATION      There were no vitals filed for this visit.  Subjective Assessment - 04/25/17 0853    Subjective   My pain has  moved down into my elbow and forearm now; it's no longer in my shoulder. It hurts like a toothache and kept me up the night before    Pertinent History  Dx: brain aneurysm 02/21/17 with stent procedure followed by AMS and seizure. PMH: HTN, CVA, heart murmur, h/o brain aneurysm in 2001    Limitations  light duty, does not drive (Premorbid)    Patient Stated Goals  Get the strength back in my Rt arm    Currently in Pain?  Yes    Pain Score  9     Pain Location  -- forearm    Pain Orientation  Right    Pain Descriptors / Indicators  -- toothache    Pain Type  Neuropathic pain    Pain Onset  In the past 7 days    Pain Frequency  Constant    Aggravating Factors   certain movements    Pain Relieving Factors  nothing                   OT Treatments/Exercises (OP) - 04/25/17 0001      ADLs   ADL Comments  Discussed pain and sleeping position  - therapist tried to determine if pain is muscular vs. neuropathic however after much discussion, then pt reported numbness in fingers along ulnar side. Pt was encouraged to discuss with neurologist.       Neurological Re-education Exercises   Other Exercises 1  Focused on positioning and proper shoulder/arm alignment with functional open chain reaching using correct muscles and preventing compensations - using upper traps and going into shoulder abduction activating middle deltoid.     Other Exercises 2  Performing shoulder flexion with cane in vertical position followed by bilateral sh. flexion with ball in standing. Bilateral sh. extension and scapula retraction with cane in standing, and guided sh. scaption with cane       Manual Therapy   Manual Therapy  Taping    Kinesiotex  Inhibit Muscle at middle deltoid and upper traps                  OT Long Term Goals - 04/11/17 1426      OT LONG TERM GOAL #1   Title  Pt will be independent with RUE HEP    Time  5    Status  Revised      OT LONG TERM GOAL #2   Title  Pt will improve grip strength Rt hand to 60 lbs     Status  Achieved 04/11/17 = 60 LBS      OT LONG TERM GOAL #3   Title  Pt will report pain 3/10 or under for overhead reaching RUE    Baseline  5/10 - 10/10    Time  5    Period  Weeks    Status  New      OT LONG TERM GOAL #4   Title  Pt will return to overhead work tasks w/ rest breaks prn     Time  5    Period  Weeks    Status  New            Plan - 04/25/17 1020    Clinical Impression Statement  Pt with little to no pain in shoulder, however more pain in forearm - ? neuropathic in nature. Therapist recommending discussing with neurologist    Occupational Profile and client history currently impacting functional performance  PMH: HTN, CVA, heart murmur, brain  aneurysm 2001 x 3    Rehab Potential  Good    OT Frequency  2x / week    OT Duration  -- 5 weeks    OT Treatment/Interventions  Moist  Heat;DME and/or AE instruction;Therapeutic activities;Therapeutic exercise;Neuromuscular education;Passive range of motion;Manual Therapy;Patient/family education;Energy conservation    Plan  monitor forearm pain, assess taping, continue NMR and functional use RUE, prone - scapula retraction, sh. ext, wt bearing on elbows    Consulted and Agree with Plan of Care  Patient       Patient will benefit from skilled therapeutic intervention in order to improve the following deficits and impairments:  Decreased coordination, Decreased range of motion, Impaired flexibility, Decreased endurance, Impaired sensation, Impaired UE functional use, Pain, Decreased strength, Decreased activity tolerance  Visit Diagnosis: Pain in right arm  Muscle weakness (generalized)    Problem List Patient Active Problem List   Diagnosis Date Noted  . Seizure ([place])   . Gout 10/23/2014  . Obesity 04/01/2014  . Prediabetes 11/11/2013  . Medication management 11/11/2013  . Brain aneurysm 04/23/2013  . OSA on CPAP   . Vitamin D deficiency   . Hyperlipidemia   . Mitral regurgitation 06/25/2012  . Chronic diastolic heart failure ([place]) 06/25/2012  . Essential hypertension 02/14/2012    Carey Bullocks, OTR/L 04/25/2017, 10:24 AM  [facility] [address] [place], [place], [address] Phone: [phone]   Fax:  [phone]  Name: ERINNE GILLENTINE MRN: [id number] Date of Birth: 02-18-1955

## 2017-05-02 ENCOUNTER — Ambulatory Visit: Payer: BLUE CROSS/BLUE SHIELD | Admitting: Occupational Therapy

## 2017-05-02 DIAGNOSIS — M79601 Pain in right arm: Secondary | ICD-10-CM

## 2017-05-02 DIAGNOSIS — M6281 Muscle weakness (generalized): Secondary | ICD-10-CM

## 2017-05-02 NOTE — Patient Instructions (Signed)
  Strengthening: Resisted Flexion   Hold tubing with _right____ arm(s) at side. Pull forward and up. Move shoulder through pain-free range of motion. Repeat __10__ times per set.  Do _1-2_ sessions per day , every other day   Strengthening: Resisted Extension   Hold tubing in _right____ hand(s), arm forward. Pull arm back, elbow straight. Repeat _10___ times per set. Do _1-2___ sessions per day, every other day.      Elbow Flexion: Resisted   With tubing held in right______ hand(s) and other end secured under foot, curl arm up as far as possible. Repeat _10___ times per set. Do _1-2___ sessions per day, every other day.    Elbow Extension: Resisted   Sit in chair with resistive band secured at armrest (or hold with other hand) and _right______ elbow bent. Straighten elbow. Repeat _10___ times per set.  Do _1-2___ sessions per day, every other day.   Copyright  VHI. All rights reserved.

## 2017-05-02 NOTE — Therapy (Signed)
[facility] [address] [place], [place], [address] Phone: [phone]   Fax:  [phone]  Occupational Therapy Treatment  Patient Details  Name: Yolanda Espinoza MRN: [id number] Date of Birth: 1955/09/18 Referring Provider: Vicie Mutters PA-C   Encounter Date: 05/02/2017  OT End of Session - 05/02/17 0852    Visit Number  4    Number of Visits  10    Authorization Type  BC/BS    Authorization Time Period  week 3/5    Authorization - Visit Number  9    Authorization - Number of Visits  15    OT Start Time  0850    OT Stop Time  0930    OT Time Calculation (min)  40 min    Activity Tolerance  Patient tolerated treatment well    Behavior During Therapy  [facility] for tasks assessed/performed       Past Medical History:  Diagnosis Date  . Anemia   . Arthritis   . Chronic diastolic heart failure ([place])   . GERD (gastroesophageal reflux disease)   . Heart murmur   . Hyperlipidemia   . Hypertension may, 1973  . Mitral regurgitation    a. Echo 01/2012 mild LVH, EF 55-65%, Gr 2 DD, mod MR, mild LAE, PASP 36;  b. Echo (02/2013):  EF 55-60%, Gr 1 DD, mild to mod MR, mild LAE (no sig change since prior echo) // c. Echo 10/18: EF 55-60, normal wall motion, grade 2 diastolic dysfunction, trivial AI, mild to moderate MR, mild LAE, trivial PI, PASP 38    . OSA on CPAP    7 yrs  . Peripheral vascular disease ([place])    cerebral aneursym  . Seizures ([place]) 01/2017  . Stroke ([place]) 07   no weakness  . Subarachnoid hemorrhage due to ruptured aneurysm ([place])    2003 - s/p coiling  . Unspecified vitamin D deficiency     Past Surgical History:  Procedure Laterality Date  . ABDOMINAL HYSTERECTOMY    . ANEURYSM COILING    . head surgery     aneurysm ruptured- stroke  . IR 3D INDEPENDENT WKST  09/04/2016  . IR ANGIO INTRA EXTRACRAN SEL COM CAROTID INNOMINATE BILAT MOD SED  09/04/2016  . IR ANGIO INTRA EXTRACRAN SEL INTERNAL CAROTID UNI  L MOD SED  02/21/2017  . IR ANGIO VERTEBRAL SEL VERTEBRAL BILAT MOD SED  09/04/2016  . IR ANGIOGRAM EXTREMITY LEFT  09/04/2016  . IR ANGIOGRAM FOLLOW UP STUDY  02/21/2017  . IR NEURO EACH ADD'L AFTER BASIC UNI LEFT (MS)  02/21/2017  . IR RADIOLOGIST EVAL & MGMT  10/11/2016  . IR RADIOLOGIST EVAL & MGMT  03/07/2017  . IR TRANSCATH/EMBOLIZ  02/21/2017  . RADIOLOGY WITH ANESTHESIA N/A 04/23/2013   Procedure: RADIOLOGY WITH ANESTHESIA;  Surgeon: Rob Hickman, MD;  Location: [facility];  Service: Radiology;  Laterality: N/A;  . RADIOLOGY WITH ANESTHESIA N/A 10/30/2016   Procedure: EMBOLIZATION;  Surgeon: Luanne Bras, MD;  Location: [facility];  Service: Radiology;  Laterality: N/A;  . RADIOLOGY WITH ANESTHESIA N/A 02/21/2017   Procedure: EMBOLIZATION;  Surgeon: Luanne Bras, MD;  Location: [facility];  Service: Radiology;  Laterality: N/A;  . TONSILLECTOMY AND ADENOIDECTOMY    . TUBAL LIGATION      There were no vitals filed for this visit.  Subjective Assessment - 05/02/17 0851    Subjective   Denies pain    Currently in Pain?  No/denies  Cane exercises for shoulder flexion and chest press, min v.c to avoid shoulder hike. Overhead functional reaching with RUE to copy small peg design, min v.c for design, then removing with in hand manipulation. Pt's BP was elevated 170/97, therapist suggested pt calls her MD.             OT Education - 05/02/17 0924    Education provided  Yes    Education Details  yellow theraband HEP 10-15 reps each    Person(s) Educated  Patient    Methods  Explanation;Demonstration;Verbal cues;Handout    Comprehension  Verbalized understanding;Returned demonstration          OT Long Term Goals - 04/11/17 1426      OT LONG TERM GOAL #1   Title  Pt will be independent with RUE HEP    Time  5    Status  Revised      OT LONG TERM GOAL #2   Title  Pt will improve grip strength Rt hand to 60 lbs     Status  Achieved 04/11/17 = 60 LBS       OT LONG TERM GOAL #3   Title  Pt will report pain 3/10 or under for overhead reaching RUE    Baseline  5/10 - 10/10    Time  5    Period  Weeks    Status  New      OT LONG TERM GOAL #4   Title  Pt will return to overhead work tasks w/ rest breaks prn     Time  5    Period  Weeks    Status  New            Plan - 05/02/17 1132    Clinical Impression Statement  Pt denies pain. Therapist updated HEP.    Occupational Profile and client history currently impacting functional performance  PMH: HTN, CVA, heart murmur, brain aneurysm 2001 x 3    Rehab Potential  Good    OT Frequency  2x / week    OT Duration  -- 5 weeks    OT Treatment/Interventions  Moist Heat;DME and/or AE instruction;Therapeutic activities;Therapeutic exercise;Neuromuscular education;Passive range of motion;Manual Therapy;Patient/family education;Energy conservation    Plan  continue NMR and UE functional use, weightbearing quadraped or on elbows, check theraband HEP    Consulted and Agree with Plan of Care  Patient       Patient will benefit from skilled therapeutic intervention in order to improve the following deficits and impairments:  Decreased coordination, Decreased range of motion, Impaired flexibility, Decreased endurance, Impaired sensation, Impaired UE functional use, Pain, Decreased strength, Decreased activity tolerance  Visit Diagnosis: Pain in right arm  Muscle weakness (generalized)    Problem List Patient Active Problem List   Diagnosis Date Noted  . Seizure ([place])   . Gout 10/23/2014  . Obesity 04/01/2014  . Prediabetes 11/11/2013  . Medication management 11/11/2013  . Brain aneurysm 04/23/2013  . OSA on CPAP   . Vitamin D deficiency   . Hyperlipidemia   . Mitral regurgitation 06/25/2012  . Chronic diastolic heart failure ([place]) 06/25/2012  . Essential hypertension 02/14/2012    Espinoza,Yolanda 05/02/2017, 11:34 AM Espinoza Yolanda, OTR/L [phone] Phone: [phone] 11:35 AM 05/02/17 [facility] [address] [place], [place], [address] Phone: [phone]   Fax:  [phone]  Name: Yolanda Espinoza MRN: [id number] Date of Birth: [birth year]

## 2017-05-04 ENCOUNTER — Ambulatory Visit: Payer: BLUE CROSS/BLUE SHIELD | Admitting: Occupational Therapy

## 2017-05-04 ENCOUNTER — Telehealth: Payer: Self-pay | Admitting: Student

## 2017-05-04 NOTE — Telephone Encounter (Signed)
Received message from patient regarding problems keeping her blood pressure controlled. Hx paraophthalmic region aneurysms x2 s/p embolization with Dr. Estanislado Pandy 02/21/2017. Patient was seen in consultation with Dr. Estanislado Pandy 03/07/2017 for follow-up regarding her procedure 02/21/2017.  Patient states that she has had high blood pressure the past few days. States that her at home blood pressure checks have been around 160s/90s. However, she went to PT this week and her blood pressure was 170/97. She is prescribed Amlodipine 10 mg once daily, Lasix 40 mg once daily, and Olmesartan 40 mg once daily for blood pressure management. Patient states she has been taking all of her blood pressure medications daily as directed. Denies headache, dizziness, or vision changes.  Discussed patient's concerns with Dr. Estanislado Pandy.  Informed patient that hypertension delays healing. Recommended that, per Dr. Estanislado Pandy, patient be seen by her PCP as soon as possible for management of her hypertension.  All questions answered and concerns addressed. Patient conveys understanding and agrees with plan.  Yolanda Graff Starr Engel, PA-C 05/04/2017, 12:41 PM

## 2017-05-08 ENCOUNTER — Other Ambulatory Visit: Payer: Self-pay | Admitting: Adult Health

## 2017-05-08 DIAGNOSIS — M545 Low back pain, unspecified: Secondary | ICD-10-CM

## 2017-05-08 DIAGNOSIS — I5032 Chronic diastolic (congestive) heart failure: Secondary | ICD-10-CM

## 2017-05-09 ENCOUNTER — Ambulatory Visit: Payer: BLUE CROSS/BLUE SHIELD | Admitting: Occupational Therapy

## 2017-05-16 ENCOUNTER — Other Ambulatory Visit: Payer: Self-pay | Admitting: Physician Assistant

## 2017-05-16 ENCOUNTER — Ambulatory Visit: Payer: BLUE CROSS/BLUE SHIELD | Admitting: Occupational Therapy

## 2017-05-16 ENCOUNTER — Encounter: Payer: Self-pay | Admitting: Physical Medicine & Rehabilitation

## 2017-05-16 ENCOUNTER — Telehealth: Payer: Self-pay

## 2017-05-16 VITALS — BP 193/110

## 2017-05-16 DIAGNOSIS — M6281 Muscle weakness (generalized): Secondary | ICD-10-CM

## 2017-05-16 MED ORDER — BISOPROLOL-HYDROCHLOROTHIAZIDE 5-6.25 MG PO TABS: 1.0000 | ORAL_TABLET | Freq: Every day | ORAL | 3 refills | Status: DC

## 2017-05-16 NOTE — Therapy (Signed)
[facility] [address] [place], [place], [address] Phone: [phone]   Fax:  [phone]  Patient Details  Name: Yolanda Espinoza MRN: [id number] Date of Birth: 11-20-1955 Referring Provider:  Vicie Mutters, PA-C  Encounter Date: 05/16/2017 Pt's BP was very high on arrival 193/110. Therapist witheld therapy. Pt plans to leave with her son driving and go to her PCP office.  RINE,KATHRYN 05/16/2017, 1:29 PM  [facility] [address] [place], [place], [address] Phone: [phone]   Fax:  [phone]

## 2017-05-16 NOTE — Telephone Encounter (Signed)
Pt reports she saw her PT person and they reported to pt that her B/P was 193/110 this AM. Her PT told her to report to her PCP. Schedule full today.  Per provider which was reported to the pt & pt voiced understanding as well. Make sure she is on CPAP, Norvasc, Benicar, & Lasix. Which pt reports she is doing at this time.  We will then add on Ziac 5mg s Q HS.  IF ANY H/A, WEAKNESS, SOB,CP, OR ANY WORSEN SXS GO TO THE ER. April 24TH 2019 AT 9:52AM.

## 2017-05-18 ENCOUNTER — Ambulatory Visit: Payer: BLUE CROSS/BLUE SHIELD | Admitting: Occupational Therapy

## 2017-06-04 ENCOUNTER — Ambulatory Visit: Payer: BLUE CROSS/BLUE SHIELD | Admitting: Physical Medicine & Rehabilitation

## 2017-06-04 ENCOUNTER — Encounter: Payer: BLUE CROSS/BLUE SHIELD | Attending: Physical Medicine & Rehabilitation

## 2017-06-04 ENCOUNTER — Encounter: Payer: Self-pay | Admitting: Physical Medicine & Rehabilitation

## 2017-06-04 VITALS — BP 151/85 | HR 48 | Resp 14 | Ht 70.5 in | Wt 212.0 lb

## 2017-06-04 DIAGNOSIS — M1990 Unspecified osteoarthritis, unspecified site: Secondary | ICD-10-CM | POA: Diagnosis not present

## 2017-06-04 DIAGNOSIS — Z8249 Family history of ischemic heart disease and other diseases of the circulatory system: Secondary | ICD-10-CM | POA: Insufficient documentation

## 2017-06-04 DIAGNOSIS — Z90710 Acquired absence of both cervix and uterus: Secondary | ICD-10-CM | POA: Diagnosis not present

## 2017-06-04 DIAGNOSIS — M792 Neuralgia and neuritis, unspecified: Secondary | ICD-10-CM

## 2017-06-04 DIAGNOSIS — Z9889 Other specified postprocedural states: Secondary | ICD-10-CM | POA: Insufficient documentation

## 2017-06-04 DIAGNOSIS — M25511 Pain in right shoulder: Secondary | ICD-10-CM | POA: Insufficient documentation

## 2017-06-04 DIAGNOSIS — G8191 Hemiplegia, unspecified affecting right dominant side: Secondary | ICD-10-CM | POA: Diagnosis not present

## 2017-06-04 DIAGNOSIS — Z841 Family history of disorders of kidney and ureter: Secondary | ICD-10-CM | POA: Insufficient documentation

## 2017-06-04 DIAGNOSIS — Z87891 Personal history of nicotine dependence: Secondary | ICD-10-CM | POA: Insufficient documentation

## 2017-06-04 DIAGNOSIS — K219 Gastro-esophageal reflux disease without esophagitis: Secondary | ICD-10-CM | POA: Insufficient documentation

## 2017-06-04 DIAGNOSIS — I69098 Other sequelae following nontraumatic subarachnoid hemorrhage: Secondary | ICD-10-CM | POA: Insufficient documentation

## 2017-06-04 DIAGNOSIS — G8929 Other chronic pain: Secondary | ICD-10-CM

## 2017-06-04 MED ORDER — GABAPENTIN 100 MG PO CAPS: 100.0000 mg | ORAL_CAPSULE | Freq: Three times a day (TID) | ORAL | 1 refills | Status: DC

## 2017-06-04 NOTE — Patient Instructions (Signed)
You have a right shoulder bursitis- will do cortisone injection   You have nerve pain from stroke - will start gabapentin , low dose may need to increase in 1 month

## 2017-06-04 NOTE — Progress Notes (Signed)
Subjective:    Patient ID: Yolanda Espinoza, female    DOB: [birth year], 62 y.o.   MRN: [id number]  HPI CC Right shoulder pain  3 month history of Right shoulder pain, was given a medication for spasm and tylenol.  Tylenol ES helps for ~10min.  Going to OT at outpt Doing scapular strengthening, Shoulder assisted ROM No history of fall or injury to shoulder Had small cortical infarct 02/21/17 associated with seizure but the pain started a couple weeks later On ASA and Brillinta for CVA KT was partially helpful. Weakness in the shoulder going down to elbow then  wrist .  No finger pain .  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 General activity 1 Relation with others 9 Enjoyment of life 6 What TIME of day is your pain at its worst? morning Sleep (in general) Fair  Pain is worse with: walking and sitting Pain improves with: nothing Relief from Meds: 0  Mobility walk without assistance how many minutes can you walk? 30 ability to climb steps?  no do you drive?  no Do you have any goals in this area?  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  no  Neuro/Psych spasms  Prior Studies new CLINICAL DATA:  62 y/o  F; witnessed seizure.  EXAM: MRI HEAD WITHOUT CONTRAST  TECHNIQUE: Multiplanar, multiecho pulse sequences of the brain and surrounding structures were obtained without intravenous contrast.  COMPARISON:  02/21/2017 CT head.  11/08/2015 MRI of the head.  FINDINGS: Brain: Punctate focus of reduced diffusion within the left parietal cortex (series 3, image 39) possible additional punctate  focus within left insula (series 3, image 23) compatible with acute/early subacute infarction. No associated hemorrhage or mass effect.  Severalnonspecific foci of T2 FLAIR hyperintense signal abnormality in subcortical and periventricular white matter are compatible withmildchronic microvascular ischemic changes for age. Mildbrain parenchymal volume loss. Foci of T2 FLAIR hyperintensity correspond to foci of hypoattenuation on same-day CT of head and are stable from prior MRI.  No abnormal susceptibility hypointensity to indicate intracranial hemorrhage. No focal mass effect. No extra-axial collection, hydrocephalus, or effacement of basilar cisterns. No gross structural abnormality of the brain. Hippocampi are symmetric in size and signal. No disorder cortical formation, dysplasia, or heterotopia identified.  Vascular: Susceptibility artifact is present from left ICA aneurysm coil mass.  Skull and upper cervical spine: Normal marrow signal.  Sinuses/Orbits: Paranasal sinus mucosal thickening with fluid levels in the ethmoid and sphenoid sinuses as well as debris in the nasopharynx likely due to intubation. No abnormal signal of mastoid air cells. Orbits are unremarkable.  Other: None.  IMPRESSION: 1. Punctate acute/early subacute cortical infarct in left parietal lobe and possible additional punctate infarct in left insula. No associated hemorrhage or mass effect. 2. Stable mild chronic microvascular ischemic changes and mild parenchymal volume loss of the brain. 3. No structural cause of seizure identified.  These results will be called to the ordering clinician or representative by the Radiologist Assistant, and communication documented in the PACS or zVision Dashboard.   Electronically Signed Physicians  involved in your care new   Family History  Problem Relation Age of Onset  . Hypertension Father   . Kidney disease Father   . Hypertension Sister     . Hypertension Sister   . Hypertension Sister    Social History   Socioeconomic History  . Marital status: Married    Spouse name: Not on file  . Number of children: 2  . Years of education: 10  . Highest education level: Not on file  Occupational History    Comment: [employer], part time  Social Needs  . Financial resource strain: Not on file  . Food insecurity:    Worry: Not on file    Inability: Not on file  . Transportation needs:    Medical: Not on file    Non-medical: Not on file  Tobacco Use  . Smoking status: Former Smoker    Packs/day: 0.25    Years: 3.00    Pack years: 0.75    Types: Cigarettes    Last attempt to quit: 01/24/1999    Years since quitting: 18.3  . Smokeless tobacco: Never Used  Substance and Sexual Activity  . Alcohol use: No  . Drug use: No  . Sexual activity: Not on file  Lifestyle  . Physical activity:    Days per week: Not on file    Minutes per session: Not on file  . Stress: Not on file  Relationships  . Social connections:    Talks on phone: Not on file    Gets together: Not on file    Attends religious service: Not on file    Active member of club or organization: Not on file    Attends meetings of clubs or organizations: Not on file    Relationship status: Not on file  Other Topics Concern  . Not on file  Social History Narrative   Lives with husband   Caffeine- maybe 1/2 soda, 1 1/2 cups coffee   Past Surgical History:  Procedure Laterality Date  . ABDOMINAL HYSTERECTOMY    . ANEURYSM COILING    . head surgery     aneurysm ruptured- stroke  . IR 3D INDEPENDENT WKST  09/04/2016  . IR ANGIO INTRA EXTRACRAN SEL COM CAROTID INNOMINATE BILAT MOD SED  09/04/2016  . IR ANGIO INTRA EXTRACRAN SEL INTERNAL CAROTID UNI L MOD SED  02/21/2017  . IR ANGIO VERTEBRAL SEL VERTEBRAL BILAT MOD SED  09/04/2016  . IR ANGIOGRAM EXTREMITY LEFT  09/04/2016  . IR ANGIOGRAM FOLLOW UP STUDY  02/21/2017  . IR NEURO EACH ADD'L AFTER BASIC UNI LEFT  (MS)  02/21/2017  . IR RADIOLOGIST EVAL & MGMT  10/11/2016  . IR RADIOLOGIST EVAL & MGMT  03/07/2017  . IR TRANSCATH/EMBOLIZ  02/21/2017  . RADIOLOGY WITH ANESTHESIA N/A 04/23/2013   Procedure: RADIOLOGY WITH ANESTHESIA;  Surgeon: Rob Hickman, MD;  Location: [facility];  Service: Radiology;  Laterality: N/A;  . RADIOLOGY WITH ANESTHESIA N/A 10/30/2016   Procedure: EMBOLIZATION;  Surgeon: Luanne Bras, MD;  Location: [facility];  Service: Radiology;  Laterality: N/A;  . RADIOLOGY WITH ANESTHESIA N/A 02/21/2017   Procedure: EMBOLIZATION;  Surgeon: Luanne Bras, MD;  Location: [facility];  Service: Radiology;  Laterality: N/A;  . TONSILLECTOMY AND ADENOIDECTOMY    . TUBAL LIGATION     Past Medical History:  Diagnosis Date  . Anemia   . Arthritis   . Chronic diastolic heart failure ([place])   . GERD (gastroesophageal reflux disease)   .  Heart murmur   . Hyperlipidemia   . Hypertension may, 1973  . Mitral regurgitation    a. Echo 01/2012 mild LVH, EF 55-65%, Gr 2 DD, mod MR, mild LAE, PASP 36;  b. Echo (02/2013):  EF 55-60%, Gr 1 DD, mild to mod MR, mild LAE (no sig change since prior echo) // c. Echo 10/18: EF 55-60, normal wall motion, grade 2 diastolic dysfunction, trivial AI, mild to moderate MR, mild LAE, trivial PI, PASP 38    . OSA on CPAP    7 yrs  . Peripheral vascular disease ([place])    cerebral aneursym  . Seizures ([place]) 01/2017  . Stroke ([place]) 07   no weakness  . Subarachnoid hemorrhage due to ruptured aneurysm ([place])    2003 - s/p coiling  . Unspecified vitamin D deficiency    BP (!) 151/85 (BP Location: Right Arm, Patient Position: Sitting, Cuff Size: Normal)   Pulse (!) 48   Resp 14   Ht 5' 10.5" (1.791 m)   Wt 212 lb (96.2 kg)   SpO2 98%   BMI 29.99 kg/m   Opioid Risk Score:   Fall Risk Score:  `1  Depression screen PHQ 2/9  Depression screen PHQ 2/9 06/04/2017  Decreased Interest 1  Down, Depressed, Hopeless 0  PHQ - 2 Score 1  Altered sleeping 0  Tired,  decreased energy 2  Change in appetite 1  Feeling bad or failure about yourself  0  Trouble concentrating 0  Moving slowly or fidgety/restless 0  Suicidal thoughts 0  PHQ-9 Score 4  Difficult doing work/chores Somewhat difficult    Review of Systems  Constitutional: Negative.   HENT: Negative.   Eyes: Negative.   Respiratory: Negative.   Cardiovascular: Negative.   Gastrointestinal: Negative.   Endocrine: Negative.   Genitourinary: Negative.   Musculoskeletal: Positive for arthralgias.       Spasms   Skin: Negative.   Allergic/Immunologic: Negative.   Neurological: Negative.   Hematological: Negative.   Psychiatric/Behavioral: Negative.   All other systems reviewed and are negative.      Objective:   Physical Exam  Constitutional: She is oriented to person, place, and time. She appears well-developed and well-nourished. No distress.  HENT:  Head: Normocephalic and atraumatic.  Eyes: Pupils are equal, round, and reactive to light. EOM are normal.  Neck: Normal range of motion.  Cardiovascular: Normal rate, regular rhythm, normal heart sounds and intact distal pulses.  No murmur heard. Pulmonary/Chest: Effort normal and breath sounds normal. No stridor. No respiratory distress.  Abdominal: Soft. Bowel sounds are normal. She exhibits no distension.  Musculoskeletal:       Right shoulder: She exhibits decreased range of motion and tenderness. She exhibits no swelling, no effusion and no deformity.  RIght subacromial tenderness  Impingement at 90 with forward flexion but not with abduction of Right shoulder  No evidence of Right shoulder subluxation  Neurological: She is alert and oriented to person, place, and time. A sensory deficit is present. No cranial nerve deficit. She exhibits normal muscle tone. Coordination abnormal. Gait normal.  Mildly diminished LT to Right hand   Finger to thumb opposition intact but slower than on the left side Mild dysdiadochokinesis  with rapid alternating supination/ pronation of RUE  Motor 4+ right delt , bi, tri, grip, 5/5 LUE 5/5 B HF, KE, ADF    Skin: Skin is warm and dry. She is not diaphoretic.  Psychiatric: She has a normal mood and affect. Her behavior  is normal. Judgment and thought content normal.          Assessment & Plan:  1.  Hemiplegic shoulder pain- Positive impingement with forward flexion Subacromial bursa injection today  Shoulder injection Right subacromial   Indication:Right Shoulder pain not relieved by medication management and other conservative care.  Informed consent was obtained after describing risks and benefits of the procedure with the patient, this includes bleeding, bruising, infection and medication side effects. The patient wishes to proceed and has given written consent. Patient was placed in a seated position. The Right shoulder was marked and prepped with betadine in the subacromial area. A 25-gauge 1-1/2 inch needle was inserted into the subacromial area. After negative draw back for blood, a solution containing 1 mL of 6 mg per ML betamethasone and 4 mL of 1% lidocaine was injected. A band aid was applied. The patient tolerated the procedure well. Post procedure instructions were given.  2.  Neurogenic  pain Right forearm post CVA Gabapentin 100mg  TID, may need to titrate up

## 2017-06-13 ENCOUNTER — Other Ambulatory Visit (HOSPITAL_COMMUNITY): Payer: Self-pay | Admitting: Interventional Radiology

## 2017-06-13 DIAGNOSIS — I729 Aneurysm of unspecified site: Secondary | ICD-10-CM

## 2017-06-27 ENCOUNTER — Ambulatory Visit (HOSPITAL_COMMUNITY): Payer: BLUE CROSS/BLUE SHIELD

## 2017-06-27 ENCOUNTER — Ambulatory Visit (HOSPITAL_COMMUNITY)
Admission: RE | Admit: 2017-06-27 | Discharge: 2017-06-27 | Disposition: A | Discharge status: Home / Self Care | Payer: BLUE CROSS/BLUE SHIELD | Source: Ambulatory Visit | Attending: Interventional Radiology | Admitting: Interventional Radiology

## 2017-06-27 DIAGNOSIS — I671 Cerebral aneurysm, nonruptured: Secondary | ICD-10-CM | POA: Diagnosis not present

## 2017-06-27 DIAGNOSIS — I729 Aneurysm of unspecified site: Secondary | ICD-10-CM | POA: Diagnosis not present

## 2017-06-27 LAB — CREATININE, SERUM
Creatinine, Ser: 1.19 mg/dL — ABNORMAL HIGH (ref 0.44–1.00)
GFR calc Af Amer: 56 mL/min — ABNORMAL LOW (ref >60)
GFR calc non Af Amer: 48 mL/min — ABNORMAL LOW (ref >60)

## 2017-06-27 MED ORDER — GADOBENATE DIMEGLUMINE 529 MG/ML IV SOLN
20.0000 mL | Freq: Once | INTRAVENOUS | Status: AC
  Administered 2017-06-27: 20 mL via INTRAVENOUS

## 2017-07-02 ENCOUNTER — Ambulatory Visit: Payer: BLUE CROSS/BLUE SHIELD | Admitting: Physical Medicine & Rehabilitation

## 2017-07-11 ENCOUNTER — Telehealth (HOSPITAL_COMMUNITY): Payer: Self-pay

## 2017-07-11 NOTE — Telephone Encounter (Signed)
Called to inform pt to f/u in 6 months with mri/mra, no answer, left vm. AW

## 2017-07-12 ENCOUNTER — Encounter: Payer: BLUE CROSS/BLUE SHIELD | Attending: Physical Medicine & Rehabilitation

## 2017-07-12 ENCOUNTER — Ambulatory Visit (HOSPITAL_BASED_OUTPATIENT_CLINIC_OR_DEPARTMENT_OTHER): Payer: BLUE CROSS/BLUE SHIELD | Admitting: Physical Medicine & Rehabilitation

## 2017-07-12 ENCOUNTER — Encounter: Payer: Self-pay | Admitting: Physical Medicine & Rehabilitation

## 2017-07-12 VITALS — BP 145/84 | HR 60 | Ht 70.0 in | Wt 218.8 lb

## 2017-07-12 DIAGNOSIS — Z8249 Family history of ischemic heart disease and other diseases of the circulatory system: Secondary | ICD-10-CM | POA: Diagnosis not present

## 2017-07-12 DIAGNOSIS — Z9889 Other specified postprocedural states: Secondary | ICD-10-CM | POA: Diagnosis not present

## 2017-07-12 DIAGNOSIS — M792 Neuralgia and neuritis, unspecified: Secondary | ICD-10-CM | POA: Insufficient documentation

## 2017-07-12 DIAGNOSIS — Z87891 Personal history of nicotine dependence: Secondary | ICD-10-CM | POA: Insufficient documentation

## 2017-07-12 DIAGNOSIS — M1990 Unspecified osteoarthritis, unspecified site: Secondary | ICD-10-CM | POA: Diagnosis not present

## 2017-07-12 DIAGNOSIS — I69098 Other sequelae following nontraumatic subarachnoid hemorrhage: Secondary | ICD-10-CM | POA: Insufficient documentation

## 2017-07-12 DIAGNOSIS — M25511 Pain in right shoulder: Secondary | ICD-10-CM | POA: Diagnosis not present

## 2017-07-12 DIAGNOSIS — Z841 Family history of disorders of kidney and ureter: Secondary | ICD-10-CM | POA: Diagnosis not present

## 2017-07-12 DIAGNOSIS — Z90710 Acquired absence of both cervix and uterus: Secondary | ICD-10-CM | POA: Insufficient documentation

## 2017-07-12 DIAGNOSIS — G8929 Other chronic pain: Secondary | ICD-10-CM | POA: Diagnosis not present

## 2017-07-12 DIAGNOSIS — K219 Gastro-esophageal reflux disease without esophagitis: Secondary | ICD-10-CM | POA: Diagnosis not present

## 2017-07-12 DIAGNOSIS — G8191 Hemiplegia, unspecified affecting right dominant side: Secondary | ICD-10-CM | POA: Insufficient documentation

## 2017-07-12 MED ORDER — GABAPENTIN 300 MG PO CAPS: 300.0000 mg | ORAL_CAPSULE | Freq: Three times a day (TID) | ORAL | 1 refills | Status: DC

## 2017-07-12 NOTE — Progress Notes (Signed)
Subjective:    Patient ID: Yolanda Espinoza, female    DOB: 05/07/1955, 62 y.o.   MRN: [id number] 3 month history of Right shoulder pain, was given a medication for spasm and tylenol.  Tylenol ES helps for ~74min.  Going to OT at outpt Doing scapular strengthening, Shoulder assisted ROM No history of fall or injury to shoulder Had small cortical infarct 02/21/17 associated with seizure but the pain started a couple weeks later On ASA and Brillinta for CVA KT was partially helpful. Weakness in the shoulder going down to elbow then  wrist .  No finger pain .  Denies numbness or tingling  Occasional burning discomfort in RUE  HPI  Chief complaint is burning pain in the right arm. This started after her stroke.  This goes into the right elbow as well as forearm.  No pain that involves the wrist hand or fingers.  Gabapentin 100 mill grams 3 times daily was started approximately 1 month ago this has given her some partial relief. 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 General activity 4 Relation with others 4 Enjoyment of life 6 What TIME of day is your pain at its worst? night Sleep (in general) Fair  Pain is worse with: some activites Pain improves with: rest Relief from Meds: 6  Mobility walk without assistance  Function employed # of hrs/week .  Neuro/Psych No problems in this area  Prior Studies Any changes since last visit?  no  Physicians involved in your care Any changes since last visit?  no   Family History  Problem Relation Age of Onset  . Hypertension Father   . Kidney disease Father   . Hypertension Sister   . Hypertension Sister   . Hypertension Sister    Social History   Socioeconomic History    . Marital status: Married    Spouse name: Not on file  . Number of children: 2  . Years of education: 67  . Highest education level: Not on file  Occupational History    Comment: [employer], part time  Social Needs  . Financial resource strain: Not on file  . Food insecurity:    Worry: Not on file    Inability: Not on file  . Transportation needs:    Medical: Not on file    Non-medical: Not on file  Tobacco Use  . Smoking status: Former Smoker    Packs/day: 0.25    Years: 3.00    Pack years: 0.75    Types: Cigarettes    Last attempt to quit: 01/24/1999    Years since quitting: 18.4  . Smokeless tobacco: Never Used  Substance and Sexual Activity  . Alcohol use: No  . Drug use: No  . Sexual activity: Not on file  Lifestyle  . Physical activity:    Days per week: Not on file    Minutes per session: Not on file  . Stress: Not on file  Relationships  . Social connections:    Talks on phone: Not on file    Gets together: Not on file    Attends religious service: Not on file    Active member of club or organization: Not on file    Attends meetings  of clubs or organizations: Not on file    Relationship status: Not on file  Other Topics Concern  . Not on file  Social History Narrative   Lives with husband   Caffeine- maybe 1/2 soda, 1 1/2 cups coffee   Past Surgical History:  Procedure Laterality Date  . ABDOMINAL HYSTERECTOMY    . ANEURYSM COILING    . head surgery     aneurysm ruptured- stroke  . IR 3D INDEPENDENT WKST  09/04/2016  . IR ANGIO INTRA EXTRACRAN SEL COM CAROTID INNOMINATE BILAT MOD SED  09/04/2016  . IR ANGIO INTRA EXTRACRAN SEL INTERNAL CAROTID UNI L MOD SED  02/21/2017  . IR ANGIO VERTEBRAL SEL VERTEBRAL BILAT MOD SED  09/04/2016  . IR ANGIOGRAM EXTREMITY LEFT  09/04/2016  . IR ANGIOGRAM FOLLOW UP STUDY  02/21/2017  . IR NEURO EACH ADD'L AFTER BASIC UNI LEFT (MS)  02/21/2017  . IR RADIOLOGIST EVAL & MGMT  10/11/2016  . IR RADIOLOGIST EVAL & MGMT   03/07/2017  . IR TRANSCATH/EMBOLIZ  02/21/2017  . RADIOLOGY WITH ANESTHESIA N/A 04/23/2013   Procedure: RADIOLOGY WITH ANESTHESIA;  Surgeon: Rob Hickman, MD;  Location: [facility];  Service: Radiology;  Laterality: N/A;  . RADIOLOGY WITH ANESTHESIA N/A 10/30/2016   Procedure: EMBOLIZATION;  Surgeon: Luanne Bras, MD;  Location: [facility];  Service: Radiology;  Laterality: N/A;  . RADIOLOGY WITH ANESTHESIA N/A 02/21/2017   Procedure: EMBOLIZATION;  Surgeon: Luanne Bras, MD;  Location: [facility];  Service: Radiology;  Laterality: N/A;  . TONSILLECTOMY AND ADENOIDECTOMY    . TUBAL LIGATION     Past Medical History:  Diagnosis Date  . Anemia   . Arthritis   . Chronic diastolic heart failure ([place])   . GERD (gastroesophageal reflux disease)   . Heart murmur   . Hyperlipidemia   . Hypertension may, 1973  . Mitral regurgitation    a. Echo 01/2012 mild LVH, EF 55-65%, Gr 2 DD, mod MR, mild LAE, PASP 36;  b. Echo (02/2013):  EF 55-60%, Gr 1 DD, mild to mod MR, mild LAE (no sig change since prior echo) // c. Echo 10/18: EF 55-60, normal wall motion, grade 2 diastolic dysfunction, trivial AI, mild to moderate MR, mild LAE, trivial PI, PASP 38    . OSA on CPAP    7 yrs  . Peripheral vascular disease ([place])    cerebral aneursym  . Seizures ([place]) 01/2017  . Stroke ([place]) 07   no weakness  . Subarachnoid hemorrhage due to ruptured aneurysm ([place])    2003 - s/p coiling  . Unspecified vitamin D deficiency    There were no vitals taken for this visit.  Opioid Risk Score:   Fall Risk Score:  `1  Depression screen PHQ 2/9  Depression screen PHQ 2/9 06/04/2017  Decreased Interest 1  Down, Depressed, Hopeless 0  PHQ - 2 Score 1  Altered sleeping 0  Tired, decreased energy 2  Change in appetite 1  Feeling bad or failure about yourself  0  Trouble concentrating 0  Moving slowly or fidgety/restless 0  Suicidal thoughts 0  PHQ-9 Score 4  Difficult doing work/chores Somewhat difficult      Review of Systems  Constitutional: Positive for unexpected weight change.  HENT: Negative.   Eyes: Negative.   Respiratory: Negative.   Cardiovascular: Negative.   Gastrointestinal: Negative.   Endocrine: Negative.   Genitourinary: Negative.   Musculoskeletal: Positive for arthralgias and myalgias.  Skin: Negative.   Allergic/Immunologic: Negative.  Neurological: Negative.   Hematological: Negative.   Psychiatric/Behavioral: Negative.   All other systems reviewed and are negative.      Objective:   Physical Exam  Constitutional: She is oriented to person, place, and time. She appears well-developed and well-nourished.  HENT:  Head: Normocephalic and atraumatic.  Eyes: Pupils are equal, round, and reactive to light. EOM are normal.  Neurological: She is alert and oriented to person, place, and time.  Skin: Skin is warm and dry.  Psychiatric: She has a normal mood and affect.  Nursing note and vitals reviewed. Right upper extremity has full range of motion actively and passively.  Negative impingement signs She has no evidence of subluxation. Motor strength is 5/5 in the deltoid bicep tricep grip She has no hypersensitivity to touch in the right forearm wrist or hand.  She has full range of motion at the elbow wrist and fingers.  No evidence of edema.  No erythema.  No allodynia.       Assessment & Plan:  #1.  Right upper extremity pain post CVA.  Her stroke affected left parietal region  I do think she has a central poststroke pain syndrome causing paresthesias and dysesthesias. For that reason I would recommend increasing gabapentin to 300 mg 3 times daily.  We can titrate upward if needed.  We had discussed increasing her current dosage of 100 mg gabapentin to 2 tablets 3 times a day until her prescription runs out. I will see the patient back in 3 months.  2.  Right subacromial bursitis I think this is improved after the injection.  No longer has positive  impingement sign.  Has full range of motion of the right shoulder. Patient will call if she needs her injection.

## 2017-08-06 NOTE — Progress Notes (Signed)
FOLLOW UP  Assessment and Plan:   Diastolic CHF decrease salt/sugar  discussed she needs to start monitoring weight Wear compression hose while at work BP stable.   Hypertension Well controlled with current medications  Monitor blood pressure at home; patient to call if consistently greater than 130/80 Continue DASH diet.   Reminder to go to the ER if any CP, SOB, nausea, dizziness, severe HA, changes vision/speech, left arm numbness and tingling and jaw pain.  Cholesterol Currently at LDL goal; diet for triglycerides discussed Continue low cholesterol diet and exercise.  Check lipid panel.   Other abnormal glucose Recent A1Cs at goal Discussed diet/exercise, weight management  Defer A1C; check BMP  Obesity with co morbidities Long discussion about weight loss, diet, and exercise Recommended diet heavy in fruits and veggies and low in animal meats, cheeses, and dairy products, appropriate calorie intake Discussed ideal weight for height  Will follow up in 3 months  Vitamin D Def Below goal at last visit; discussed starting 5000 IU daily supplement Check Vit D level  GERD Initiated medication for new reflux related symptoms: ranitidine 150 mg BID Discussed diet, avoiding triggers and other lifestyle changes   Continue diet and meds as discussed. Further disposition pending results of labs. Discussed med's effects and SE's.   Over 30 minutes of exam, counseling, chart review, and critical decision making was performed.   Future Appointments  Date Time Provider [place]  08/08/2017  8:45 AM Liane Comber, NP [facility] None  09/25/2017  9:00 AM Penumalli, Earlean Polka, MD [facility] None  10/12/2017  9:45 AM Kirsteins, Luanna Salk, MD [facility] None  01/21/2018 10:00 AM Vicie Mutters, PA-C [facility] None    ----------------------------------------------------------------------------------------------------------------------  HPI 62 y.o. female  presents for 3 month  follow up on hypertension, cholesterol, glucose management, obesity and vitamin D deficiency. She has history of CVA due to aneurysm, PVD and MR, follows with Dr. Sondra Come. She is on brilinta one a day, bASA. She is on keppra 1G BID and no further seizures.   BMI is Body mass index is 32 kg/m. 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   She has a history of MR with stage 2 Diastolic dysfunction (1856 EF 55-60%) denies dyspnea on exertion, orthopnea and paroxysmal nocturnal dyspnea. Positive for mild pedal edema. Wt Readings from Last 3 Encounters:  08/08/17 223 lb (101.2 kg)  07/12/17 218 lb 12.8 oz (99.2 kg)  06/04/17 212 lb (96.2 kg)   Her blood pressure has been controlled at home (130/60-70), today their BP is BP: 140/78  She does not workout. She denies chest pain, shortness of breath, dizziness.   She is not on cholesterol medication (omega 3 supplement only). Her LDL cholesterol is at goal. The cholesterol last visit was:   Lab Results  Component Value Date   CHOL 181 04/05/2017   HDL 99 04/05/2017   LDLCALC 56 04/05/2017   TRIG 191 (H) 04/05/2017   CHOLHDL 1.8 04/05/2017    She has not been working on diet and exercise for glucose management, and denies foot ulcerations, increased appetite, nausea, paresthesia of the feet, polydipsia, polyuria, visual disturbances, vomiting and weight loss. Last A1C in the office was:  Lab Results  Component Value Date   HGBA1C 5.3 01/05/2017   Patient is on Vitamin D supplement.   Lab Results  Component Value Date   VD25OH 27 (L) 01/05/2017        Current Medications:  Current Outpatient Medications on File Prior to Visit  Medication Sig  . amLODipine (NORVASC) 10 MG tablet TAKE (1) TABLET BY MOUTH DAILY  . aspirin EC 81 MG tablet Take 1 tablet (81 mg total) by mouth daily.  . bisoprolol-hydrochlorothiazide (ZIAC) 5-6.25 MG tablet Take 1 tablet by mouth daily.  Marland Kitchen BRILINTA 90 MG  TABS tablet TAKE ONE (1) TABLET BY MOUTH TWICE DAILY  . Calcium Carb-Cholecalciferol (CALCIUM 600 + D PO) Take 1 tablet by mouth daily.  . cyclobenzaprine (FLEXERIL) 10 MG tablet TAKE 1 TABLET BY MOUTH AT BEDTIME AS NEEDED FOR MUSCLE SPASMS  . estrogens, conjugated, (PREMARIN) 0.9 MG tablet TAKE 1 TABLET (0.9mg ) BY MOUTH ONCE DAILY.  . ferrous sulfate 325 (65 FE) MG tablet Take 325 mg by mouth daily with breakfast.  . furosemide (LASIX) 40 MG tablet TAKE (1) TABLET BY MOUTH DAILY AS NEEDED FOR EDEMA  . gabapentin (NEURONTIN) 300 MG capsule Take 1 capsule (300 mg total) by mouth 3 (three) times daily.  Marland Kitchen levETIRAcetam (KEPPRA) 1000 MG tablet TAKE 1 TABLET BY MOUTH TWICE DAILY  . magnesium gluconate (MAGONATE) 500 MG tablet Take 500 mg by mouth daily.  . Multiple Vitamins-Minerals (MULTIVITAMIN WITH MINERALS) tablet Take 1 tablet by mouth daily.  Marland Kitchen olmesartan (BENICAR) 40 MG tablet Take 1 tablet (40 mg total) by mouth daily.  . Omega-3 Fatty Acids (FISH OIL) 1200 MG CAPS Take 2,400 mg by mouth daily.   . vitamin B-12 (CYANOCOBALAMIN) 1000 MCG tablet Take 1,000 mcg by mouth daily.   No current facility-administered medications on file prior to visit.      Allergies:  Allergies  Allergen Reactions  . Ace Inhibitors Nausea And Vomiting  . Aspirin Nausea And Vomiting    Can take coated Aspirin     Medical History:  Past Medical History:  Diagnosis Date  . Anemia   . Arthritis   . Chronic diastolic heart failure ([place])   . GERD (gastroesophageal reflux disease)   . Heart murmur   . Hyperlipidemia   . Hypertension may, 1973  . Mitral regurgitation    a. Echo 01/2012 mild LVH, EF 55-65%, Gr 2 DD, mod MR, mild LAE, PASP 36;  b. Echo (02/2013):  EF 55-60%, Gr 1 DD, mild to mod MR, mild LAE (no sig change since prior echo) // c. Echo 10/18: EF 55-60, normal wall motion, grade 2 diastolic dysfunction, trivial AI, mild to moderate MR, mild LAE, trivial PI, PASP 38    . OSA on CPAP    7 yrs  .  Peripheral vascular disease ([place])    cerebral aneursym  . Seizures ([place]) 01/2017  . Stroke ([place]) 07   no weakness  . Subarachnoid hemorrhage due to ruptured aneurysm ([place])    2003 - s/p coiling  . Unspecified vitamin D deficiency    Family history- Reviewed and unchanged Social history- Reviewed and unchanged   Review of Systems:  Review of Systems  Constitutional: Negative for malaise/fatigue and weight loss.  HENT: Negative for hearing loss and tinnitus.   Eyes: Negative for blurred vision and double vision.  Respiratory: Negative for cough, shortness of breath and wheezing.   Cardiovascular: Negative for chest pain, palpitations, orthopnea, claudication and leg swelling.  Gastrointestinal: Positive for heartburn (Daily, with cough). Negative for abdominal pain, blood in stool, constipation, diarrhea, melena, nausea and vomiting.  Genitourinary: Negative.   Musculoskeletal: Negative for joint pain and myalgias.  Skin: Negative for rash.  Neurological: Negative for dizziness, tingling, sensory change,  weakness and headaches.  Endo/Heme/Allergies: Negative for polydipsia.  Psychiatric/Behavioral: Negative.   All other systems reviewed and are negative.   Physical Exam: BP 140/78   Pulse 76   Temp (!) 97.3 F (36.3 C)   Ht 5\' 10"  (1.778 m)   Wt 223 lb (101.2 kg)   SpO2 99%   BMI 32.00 kg/m  Wt Readings from Last 3 Encounters:  08/08/17 223 lb (101.2 kg)  07/12/17 218 lb 12.8 oz (99.2 kg)  06/04/17 212 lb (96.2 kg)   General Appearance: Well nourished, in no apparent distress. Eyes: PERRLA, EOMs, conjunctiva no swelling or erythema Sinuses: No Frontal/maxillary tenderness ENT/Mouth: Ext aud canals clear, TMs without erythema, bulging. No erythema, swelling, or exudate on post pharynx.  Tonsils not swollen or erythematous. Hearing normal.  Neck: Supple, thyroid normal.  Respiratory: Respiratory effort normal, BS equal bilaterally without rales, rhonchi, wheezing or  stridor.  Cardio: RRR with blowing 3/6 systolic murmur (per patient not new). Brisk peripheral pulses with scant pedal non-pitting edema.  Abdomen: Soft, + BS.  Non tender, no guarding, rebound, hernias, masses. Lymphatics: Non tender without lymphadenopathy.  Musculoskeletal: Full ROM, 5/5 strength, Normal gait Skin: Warm, dry without rashes, lesions, ecchymosis.  Neuro: Cranial nerves intact. No cerebellar symptoms.  Psych: Awake and oriented X 3, normal affect, Insight and Judgment appropriate.    Izora Ribas, NP 8:35 AM [facility]

## 2017-08-08 ENCOUNTER — Ambulatory Visit: Payer: BLUE CROSS/BLUE SHIELD | Admitting: Adult Health

## 2017-08-08 ENCOUNTER — Encounter: Payer: Self-pay | Admitting: Adult Health

## 2017-08-08 VITALS — BP 140/78 | HR 76 | Temp 97.3°F | Ht 70.0 in | Wt 223.0 lb

## 2017-08-08 DIAGNOSIS — K219 Gastro-esophageal reflux disease without esophagitis: Secondary | ICD-10-CM | POA: Diagnosis not present

## 2017-08-08 DIAGNOSIS — Z79899 Other long term (current) drug therapy: Secondary | ICD-10-CM | POA: Diagnosis not present

## 2017-08-08 DIAGNOSIS — R569 Unspecified convulsions: Secondary | ICD-10-CM

## 2017-08-08 DIAGNOSIS — R7303 Prediabetes: Secondary | ICD-10-CM | POA: Diagnosis not present

## 2017-08-08 DIAGNOSIS — E785 Hyperlipidemia, unspecified: Secondary | ICD-10-CM

## 2017-08-08 DIAGNOSIS — E559 Vitamin D deficiency, unspecified: Secondary | ICD-10-CM

## 2017-08-08 DIAGNOSIS — I10 Essential (primary) hypertension: Secondary | ICD-10-CM

## 2017-08-08 DIAGNOSIS — I5032 Chronic diastolic (congestive) heart failure: Secondary | ICD-10-CM | POA: Diagnosis not present

## 2017-08-08 MED ORDER — CHOLECALCIFEROL 125 MCG (5000 UT) PO CAPS: 5000.0000 [IU] | ORAL_CAPSULE | Freq: Every day | ORAL | Status: DC

## 2017-08-08 MED ORDER — RANITIDINE HCL 150 MG PO TABS: 150.0000 mg | ORAL_TABLET | Freq: Two times a day (BID) | ORAL | 1 refills | Status: DC

## 2017-08-08 NOTE — Patient Instructions (Addendum)
Add 5000 IU daily vitamin D3 (cholecalciferol) supplement   Ranitidine (zantac) 150 mg - can take once to twice a day, before breakfast and before bed  Get compression hose - [employer] 3 pair ~10$     Do the following things EVERYDAY: 1) Weigh yourself in the morning before breakfast or at the same time every day. Write it down and keep it in a log. 2) Take your medicines as prescribed 3) Eat low salt foods-Limit salt (sodium) to 2000 mg per day. Best thing to do is avoid processed foods.   4) Stay as active as you can everyday 5) Limit all fluids for the day to less than 1.5 liters  Call your doctor if:  Anytime you have any of the following symptoms:  1) 2 pound weight gain in 24 hours or 5 pounds in 1 week  2) shortness of breath, with or without a dry hacking cough  3) swelling in the hands, LEGs, feet or stomach  4) if you have to sleep on extra pillows at night in order to breathe. 5) after laying down at night for 20-30 mins, you wake up short of breath.   These can all be signs of fluid overload.    Heart Failure Heart failure means your heart has trouble pumping blood. This makes it hard for your body to work well. Heart failure is usually a long-term (chronic) condition. You must take good care of yourself and follow your doctor's treatment plan. Follow these instructions at home:  Take your heart medicine as told by your doctor. ? Do not stop taking medicine unless your doctor tells you to. ? Do not skip any dose of medicine. ? Refill your medicines before they run out. ? Take other medicines only as told by your doctor or pharmacist.  Stay active if told by your doctor. The elderly and people with severe heart failure should talk with a doctor about physical activity.  Eat heart-healthy foods. Choose foods that are without trans fat and are low in saturated fat, cholesterol, and salt (sodium). This includes fresh or frozen fruits and vegetables, fish, lean meats,  fat-free or low-fat dairy foods, whole grains, and high-fiber foods. Lentils and dried peas and beans (legumes) are also good choices.  Limit salt if told by your doctor.  Cook in a healthy way. Roast, grill, broil, bake, poach, steam, or stir-fry foods.  Limit fluids as told by your doctor.  Weigh yourself every morning. Do this after you pee (urinate) and before you eat breakfast. Write down your weight to give to your doctor.  Take your blood pressure and write it down if your doctor tells you to.  Ask your doctor how to check your pulse. Check your pulse as told.  Lose weight if told by your doctor.  Stop smoking or chewing tobacco. Do not use gum or patches that help you quit without your doctor's approval.  Schedule and go to doctor visits as told.  Nonpregnant women should have no more than 1 drink a day. Men should have no more than 2 drinks a day. Talk to your doctor about drinking alcohol.  Stop illegal drug use.  Stay current with shots (immunizations).  Manage your health conditions as told by your doctor.  Learn to manage your stress.  Rest when you are tired.  If it is really hot outside: ? Avoid intense activities. ? Use air conditioning or fans, or get in a cooler place. ? Avoid caffeine and alcohol. ?  Wear loose-fitting, lightweight, and light-colored clothing.  If it is really cold outside: ? Avoid intense activities. ? Layer your clothing. ? Wear mittens or gloves, a hat, and a scarf when going outside. ? Avoid alcohol.  Learn about heart failure and get support as needed.  Get help to maintain or improve your quality of life and your ability to care for yourself as needed. Contact a doctor if:  You gain weight quickly.  You are more short of breath than usual.  You cannot do your normal activities.  You tire easily.  You cough more than normal, especially with activity.  You have any or more puffiness (swelling) in areas such as your  hands, feet, ankles, or belly (abdomen).  You cannot sleep because it is hard to breathe.  You feel like your heart is beating fast (palpitations).  You get dizzy or light-headed when you stand up. Get help right away if:  You have trouble breathing.  There is a change in mental status, such as becoming less alert or not being able to focus.  You have chest pain or discomfort.  You faint. This information is not intended to replace advice given to you by your health care provider. Make sure you discuss any questions you have with your health care provider. Document Released: 10/19/2007 Document Revised: 06/17/2015 Document Reviewed: 02/26/2012 Elsevier Interactive Patient Education  2017 [employer].

## 2017-08-11 LAB — CBC WITH DIFFERENTIAL/PLATELET
Basophils Absolute: 58 {cells}/uL (ref 0–200)
Basophils Relative: 0.8 %
Eosinophils Absolute: 350 {cells}/uL (ref 15–500)
Eosinophils Relative: 4.8 %
HCT: 30.8 % — ABNORMAL LOW (ref 35.0–45.0)
Hemoglobin: 10.1 g/dL — ABNORMAL LOW (ref 11.7–15.5)
Lymphs Abs: 2847 {cells}/uL (ref 850–3900)
MCH: 27.1 pg (ref 27.0–33.0)
MCHC: 32.8 g/dL (ref 32.0–36.0)
MCV: 82.6 fL (ref 80.0–100.0)
MPV: 9.6 fL (ref 7.5–12.5)
Monocytes Relative: 7 %
Neutro Abs: 3533 {cells}/uL (ref 1500–7800)
Neutrophils Relative %: 48.4 %
Platelets: 274 Thousand/uL (ref 140–400)
RBC: 3.73 Million/uL — ABNORMAL LOW (ref 3.80–5.10)
RDW: 14.2 % (ref 11.0–15.0)
Total Lymphocyte: 39 %
WBC mixed population: 511 {cells}/uL (ref 200–950)
WBC: 7.3 Thousand/uL (ref 3.8–10.8)

## 2017-08-11 LAB — LIPID PANEL
CHOL/HDL RATIO: 1.9 (calc) (ref <5.0)
Cholesterol: 210 mg/dL — ABNORMAL HIGH (ref <200)
HDL: 109 mg/dL (ref >50)
LDL CHOLESTEROL (CALC): 78 mg/dL
Non-HDL Cholesterol (Calc): 101 mg/dL (calc) (ref <130)
Triglycerides: 135 mg/dL (ref <150)

## 2017-08-11 LAB — COMPLETE METABOLIC PANEL WITH GFR
AG Ratio: 1.3 (calc) (ref 1.0–2.5)
ALBUMIN MSPROF: 4.4 g/dL (ref 3.6–5.1)
ALT: 5 U/L — ABNORMAL LOW (ref 6–29)
AST: 13 U/L (ref 10–35)
Alkaline phosphatase (APISO): 59 U/L (ref 33–130)
BILIRUBIN TOTAL: 0.3 mg/dL (ref 0.2–1.2)
BUN / CREAT RATIO: 19 (calc) (ref 6–22)
BUN: 21 mg/dL (ref 7–25)
CO2: 27 mmol/L (ref 20–32)
Calcium: 9.8 mg/dL (ref 8.6–10.4)
Chloride: 103 mmol/L (ref 98–110)
Creat: 1.13 mg/dL — ABNORMAL HIGH (ref 0.50–0.99)
GFR, EST AFRICAN AMERICAN: 61 mL/min/{1.73_m2} (ref >=60)
GFR, Est Non African American: 52 mL/min/{1.73_m2} — ABNORMAL LOW (ref >=60)
GLOBULIN: 3.4 g/dL (ref 1.9–3.7)
GLUCOSE: 115 mg/dL — AB (ref 65–99)
Potassium: 4.6 mmol/L (ref 3.5–5.3)
SODIUM: 140 mmol/L (ref 135–146)
TOTAL PROTEIN: 7.8 g/dL (ref 6.1–8.1)

## 2017-08-11 LAB — TSH: TSH: 3.48 m[IU]/L (ref 0.40–4.50)

## 2017-08-11 LAB — VITAMIN D 25 HYDROXY (VIT D DEFICIENCY, FRACTURES): VIT D 25 HYDROXY: 22 ng/mL — AB (ref 30–100)

## 2017-08-11 LAB — LEVETIRACETAM LEVEL: Keppra (Levetiracetam): 29.7 ug/mL (ref 12.0–46.0)

## 2017-09-05 ENCOUNTER — Other Ambulatory Visit: Payer: Self-pay | Admitting: Adult Health

## 2017-09-06 ENCOUNTER — Other Ambulatory Visit: Payer: Self-pay | Admitting: Physical Medicine & Rehabilitation

## 2017-09-07 ENCOUNTER — Other Ambulatory Visit: Payer: Self-pay

## 2017-09-07 ENCOUNTER — Other Ambulatory Visit: Payer: Self-pay | Admitting: Physical Medicine & Rehabilitation

## 2017-09-07 MED ORDER — GABAPENTIN 300 MG PO CAPS: 300.0000 mg | ORAL_CAPSULE | Freq: Three times a day (TID) | ORAL | 1 refills | Status: DC

## 2017-09-07 NOTE — Telephone Encounter (Signed)
Recieved electronic medication refill request for Gabapentin.  Current prescription is

## 2017-09-07 NOTE — Telephone Encounter (Signed)
Recieved electronic medication refill request for gabapentin medication, request that had been recieved was the previous dosage and dosage since that time has been increased.  Reordering medication using the correct dosage and instructions.

## 2017-09-25 ENCOUNTER — Ambulatory Visit: Payer: BLUE CROSS/BLUE SHIELD | Admitting: Diagnostic Neuroimaging

## 2017-09-25 ENCOUNTER — Telehealth: Payer: Self-pay | Admitting: *Deleted

## 2017-09-25 NOTE — Telephone Encounter (Signed)
Pt no showed for appt today.  

## 2017-09-26 ENCOUNTER — Encounter: Payer: Self-pay | Admitting: Diagnostic Neuroimaging

## 2017-10-12 ENCOUNTER — Ambulatory Visit: Payer: BLUE CROSS/BLUE SHIELD | Admitting: Physical Medicine & Rehabilitation

## 2017-10-16 ENCOUNTER — Encounter: Payer: BLUE CROSS/BLUE SHIELD | Attending: Physical Medicine & Rehabilitation

## 2017-10-16 ENCOUNTER — Encounter: Payer: Self-pay | Admitting: Physical Medicine & Rehabilitation

## 2017-10-16 ENCOUNTER — Ambulatory Visit: Payer: BLUE CROSS/BLUE SHIELD | Admitting: Physical Medicine & Rehabilitation

## 2017-10-16 VITALS — BP 126/80 | HR 78 | Ht 71.0 in | Wt 224.0 lb

## 2017-10-16 DIAGNOSIS — I110 Hypertensive heart disease with heart failure: Secondary | ICD-10-CM | POA: Insufficient documentation

## 2017-10-16 DIAGNOSIS — Z87891 Personal history of nicotine dependence: Secondary | ICD-10-CM | POA: Insufficient documentation

## 2017-10-16 DIAGNOSIS — I69351 Hemiplegia and hemiparesis following cerebral infarction affecting right dominant side: Secondary | ICD-10-CM | POA: Insufficient documentation

## 2017-10-16 DIAGNOSIS — I5032 Chronic diastolic (congestive) heart failure: Secondary | ICD-10-CM | POA: Insufficient documentation

## 2017-10-16 DIAGNOSIS — M792 Neuralgia and neuritis, unspecified: Secondary | ICD-10-CM | POA: Insufficient documentation

## 2017-10-16 DIAGNOSIS — R569 Unspecified convulsions: Secondary | ICD-10-CM | POA: Insufficient documentation

## 2017-10-16 DIAGNOSIS — M7711 Lateral epicondylitis, right elbow: Secondary | ICD-10-CM

## 2017-10-16 DIAGNOSIS — E785 Hyperlipidemia, unspecified: Secondary | ICD-10-CM | POA: Insufficient documentation

## 2017-10-16 DIAGNOSIS — E559 Vitamin D deficiency, unspecified: Secondary | ICD-10-CM | POA: Diagnosis not present

## 2017-10-16 DIAGNOSIS — K219 Gastro-esophageal reflux disease without esophagitis: Secondary | ICD-10-CM | POA: Diagnosis not present

## 2017-10-16 MED ORDER — GABAPENTIN 600 MG PO TABS: 600.0000 mg | ORAL_TABLET | Freq: Every day | ORAL | 1 refills | Status: DC

## 2017-10-16 MED ORDER — DICLOFENAC SODIUM 1 % TD GEL: 2.0000 g | Freq: Four times a day (QID) | TRANSDERMAL | 2 refills | Status: DC

## 2017-10-16 NOTE — Patient Instructions (Signed)
Tennis Elbow Rehab  Ask your health care provider which exercises are safe for you. Do exercises exactly as told by your health care provider and adjust them as directed. It is normal to feel mild stretching, pulling, tightness, or discomfort as you do these exercises, but you should stop right away if you feel sudden pain or your pain gets worse. Do not begin these exercises until told by your health care provider.  Stretching and range of motion exercises  These exercises warm up your muscles and joints and improve the movement and flexibility of your elbow. These exercises also help to relieve pain, numbness, and tingling.  Exercise A: Wrist extensor stretch  1. Extend your left / right elbow with your fingers pointing down.  2. Gently pull the palm of your left / right hand toward you until you feel a gentle stretch on the top of your forearm.  3. To increase the stretch, push your left / right hand toward the outer edge or pinkie side of your forearm.  4. Hold this position for __________ seconds.  Repeat __________ times. Complete this exercise __________ times a day.  If directed by your health care provider, repeat this stretch except do it with a bent elbow this time.  Exercise B: Wrist flexor stretch    1. Extend your left / right elbow and turn your palm upward.  2. Gently pull your left / right palm and fingertips back so your wrist extends and your fingers point more toward the ground.  3. You should feel a gentle stretch on the inside of your forearm.  4. Hold this position for __________ seconds.  Repeat __________ times. Complete this exercise __________ times a day.  If directed by your health care provider, repeat this stretch except do it with a bent elbow this time.  Strengthening exercises  These exercises build strength and endurance in your elbow. Endurance is the ability to use your muscles for a long time, even after they get tired.  Exercise C: Wrist extensors    1. Sit with your left /  right forearm palm-down and fully supported on a table or countertop. Your elbow should be resting below the height of your shoulder.  2. Let your left / right wrist extend over the edge of the surface.  3. Loosely hold a __________ weight or a piece of rubber exercise band or tubing in your left / right hand. Slowly curl your left / right hand up toward your forearm. If you are using band or tubing, hold the band or tubing in place with your other hand to provide resistance.  4. Hold this position for __________ seconds.  5. Slowly return to the starting position.  Repeat __________ times. Complete this exercise __________ times a day.  Exercise D: Radial deviators    1. Stand with a __________ weight in your left / righthand. Or, sit while holding a rubber exercise band or tubing with your other arm supported on a table or countertop. Position your hand so your thumb is on top.  2. Raise your hand upward in front of you so your thumb travels toward your forearm, or pull up on the rubber tubing.  3. Hold this position for __________ seconds.  4. Slowly return to the starting position.  Repeat __________ times. Complete this exercise __________ times a day.  Exercise E: Eccentric wrist extensors  1. Sit with your left / right forearm palm-down and fully supported on a table or countertop. Your   elbow should be resting below the height of your shoulder.  2. If told by your health care provider, hold a __________ weight in your hand.  3. Let your left / right wrist extend over the edge of the surface.  4. Use your other hand to lift up your left / right hand toward your forearm. Keep your forearm on the table.  5. Using only the muscles in your left / right hand, slowly lower your hand back down to the starting position.  Repeat __________ times. Complete this exercise __________ times a day.  This information is not intended to replace advice given to you by your health care provider. Make sure you discuss any  questions you have with your health care provider.  Document Released: 01/09/2005 Document Revised: 09/15/2015 Document Reviewed: 10/08/2014  Elsevier Interactive Patient Education  2018 [employer]

## 2017-10-16 NOTE — Progress Notes (Signed)
Subjective:    Patient ID: Yolanda Espinoza, female    DOB: 02-Mar-1955, 62 y.o.   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  She has been complaining of right forearm pain primarily pain is worse at night but also occurs during the day.  She had some problems with shoulder pain but responded well to a subacromial bursa injection performed in June 2019. She has had no new trauma to the right forearm area no falls. No significant neck pain Pain Inventory Average Pain 8 Pain Right Now 8 My pain is constant  In the last 24 hours, has pain interfered with the following? General activity 9 Relation with others 9 Enjoyment of life 9 What TIME of day is your pain at its worst? night Sleep (in general) Fair  Pain is worse with: some activites Pain improves with: rest and medication Relief from Meds: 8  Mobility walk without assistance ability to climb steps?  yes do you drive?  yes  Function employed # of hrs/week 36  Neuro/Psych tremor  Prior Studies Any changes since last visit?  no  Physicians involved in your care Any changes since last visit?  no   Family History  Problem Relation Age of Onset  . Hypertension Father   . Kidney disease Father   . Hypertension Sister   . Hypertension Sister   . Hypertension Sister    Social History   Socioeconomic History  . Marital status: Married    Spouse name: Not on file  . Number of children: 2  . Years of education: 63  . Highest education level: Not on file  Occupational History    Comment: [employer], part time  Social Needs  . Financial resource strain: Not on file  . Food insecurity:    Worry: Not on file    Inability: Not on file  . Transportation needs:    Medical: Not on file    Non-medical: Not on file  Tobacco Use  . Smoking  status: Former Smoker    Packs/day: 0.25    Years: 3.00    Pack years: 0.75    Types: Cigarettes    Last attempt to quit: 01/24/1999    Years since quitting: 18.7  . Smokeless tobacco: Never Used  Substance and Sexual Activity  . Alcohol use: No  . Drug use: No  . Sexual activity: Not on file  Lifestyle  . Physical activity:    Days per week: Not on file    Minutes per session: Not on file  . Stress: Not on file  Relationships  . Social connections:    Talks on phone: Not on file    Gets together: Not on file    Attends religious service: Not on file    Active member of club or organization: Not on file    Attends meetings of clubs or organizations: Not on file    Relationship status: Not on file  Other Topics Concern  . Not on file  Social History Narrative   Lives with husband   Caffeine- maybe 1/2 soda, 1 1/2 cups coffee   Past Surgical History:  Procedure Laterality Date  . ABDOMINAL HYSTERECTOMY    . ANEURYSM COILING    . head surgery     aneurysm ruptured- stroke  . IR 3D INDEPENDENT  [facility]  09/04/2016  . IR ANGIO INTRA EXTRACRAN SEL COM CAROTID INNOMINATE BILAT MOD SED  09/04/2016  . IR ANGIO INTRA EXTRACRAN SEL INTERNAL CAROTID UNI L MOD SED  02/21/2017  . IR ANGIO VERTEBRAL SEL VERTEBRAL BILAT MOD SED  09/04/2016  . IR ANGIOGRAM EXTREMITY LEFT  09/04/2016  . IR ANGIOGRAM FOLLOW UP STUDY  02/21/2017  . IR NEURO EACH ADD'L AFTER BASIC UNI LEFT (MS)  02/21/2017  . IR RADIOLOGIST EVAL & MGMT  10/11/2016  . IR RADIOLOGIST EVAL & MGMT  03/07/2017  . IR TRANSCATH/EMBOLIZ  02/21/2017  . RADIOLOGY WITH ANESTHESIA N/A 04/23/2013   Procedure: RADIOLOGY WITH ANESTHESIA;  Surgeon: Rob Hickman, MD;  Location: [facility];  Service: Radiology;  Laterality: N/A;  . RADIOLOGY WITH ANESTHESIA N/A 10/30/2016   Procedure: EMBOLIZATION;  Surgeon: Luanne Bras, MD;  Location: [facility];  Service: Radiology;  Laterality: N/A;  . RADIOLOGY WITH ANESTHESIA N/A 02/21/2017   Procedure:  EMBOLIZATION;  Surgeon: Luanne Bras, MD;  Location: [facility];  Service: Radiology;  Laterality: N/A;  . TONSILLECTOMY AND ADENOIDECTOMY    . TUBAL LIGATION     Past Medical History:  Diagnosis Date  . Anemia   . Arthritis   . Chronic diastolic heart failure ([place])   . GERD (gastroesophageal reflux disease)   . Heart murmur   . Hyperlipidemia   . Hypertension may, 1973  . Mitral regurgitation    a. Echo 01/2012 mild LVH, EF 55-65%, Gr 2 DD, mod MR, mild LAE, PASP 36;  b. Echo (02/2013):  EF 55-60%, Gr 1 DD, mild to mod MR, mild LAE (no sig change since prior echo) // c. Echo 10/18: EF 55-60, normal wall motion, grade 2 diastolic dysfunction, trivial AI, mild to moderate MR, mild LAE, trivial PI, PASP 38    . OSA on CPAP    7 yrs  . Peripheral vascular disease ([place])    cerebral aneursym  . Seizures ([place]) 01/2017  . Stroke ([place]) 07   no weakness  . Subarachnoid hemorrhage due to ruptured aneurysm ([place])    2003 - s/p coiling  . Unspecified vitamin D deficiency    There were no vitals taken for this visit.  Opioid Risk Score:   Fall Risk Score:  `1  Depression screen PHQ 2/9  Depression screen PHQ 2/9 06/04/2017  Decreased Interest 1  Down, Depressed, Hopeless 0  PHQ - 2 Score 1  Altered sleeping 0  Tired, decreased energy 2  Change in appetite 1  Feeling bad or failure about yourself  0  Trouble concentrating 0  Moving slowly or fidgety/restless 0  Suicidal thoughts 0  PHQ-9 Score 4  Difficult doing work/chores Somewhat difficult     Review of Systems  Constitutional: Negative.   HENT: Negative.   Eyes: Negative.   Respiratory: Positive for cough.   Cardiovascular: Negative.   Gastrointestinal: Negative.   Endocrine: Negative.   Genitourinary: Negative.   Musculoskeletal: Negative.   Skin: Negative.   Allergic/Immunologic: Negative.   Neurological: Positive for tremors.  Hematological: Negative.   Psychiatric/Behavioral: Negative.   All other systems  reviewed and are negative.      Objective:   Physical Exam  Constitutional: She is oriented to person, place, and time. She appears well-developed and well-nourished. No distress.  HENT:  Head: Normocephalic and atraumatic.  Eyes: Pupils are equal, round, and reactive to light. EOM are normal.  Neck: Normal range of motion.  Musculoskeletal:       Right  shoulder: She exhibits normal range of motion and no tenderness.       Right elbow: She exhibits normal range of motion, no swelling, no effusion and no deformity. Tenderness found. Lateral epicondyle tenderness noted.       Right wrist: She exhibits tenderness. She exhibits normal range of motion, no swelling and no effusion.  Negative impingement sign right shoulder  There is tenderness over the right lateral epicondyle.  There is pain with resisted wrist extension both at the dorsal aspect of the wrist as well as the lateral epicondyle area. There is no evidence deformity at the wrist no evidence of synovitis.  MCPs PIPs DIPs all without swelling or deformities.   Neurological: She is alert and oriented to person, place, and time.  Normal sensation bilateral upper limbs No evidence of spasticity Deep tendon reflexes are normal in the right upper extremity.  Coordination is normal with finger thumb opposition in the right upper extremity.  Skin: Skin is warm and dry. She is not diaphoretic.  Nursing note and vitals reviewed.         Assessment & Plan:  #1.  History of left cortical infarct she had some right sided weakness which is fairly minimal at this point patient notices this with lifting mainly at work. She continues to have some forearm pain which is likely combination of musculoskeletal and neurogenic.  Will switch gabapentin from 300 mg 3 times daily to 600 mg at nighttime only 2.  Right lateral epicondylitis I have given her some exercises for this.  We will do Voltaren gel 4 times daily to the right lateral epicondyle  as well as dorsum of the wrist.  Also instructed her to wear a tennis elbow splint the right side particularly when working. I will see her back in 3 months.  She is been instructed to call before 3 months if she fails to have some improvement in her symptomatology.

## 2017-10-31 ENCOUNTER — Other Ambulatory Visit: Payer: Self-pay | Admitting: Physician Assistant

## 2017-10-31 DIAGNOSIS — M545 Low back pain, unspecified: Secondary | ICD-10-CM

## 2017-10-31 DIAGNOSIS — I5032 Chronic diastolic (congestive) heart failure: Secondary | ICD-10-CM

## 2017-11-07 DIAGNOSIS — D3132 Benign neoplasm of left choroid: Secondary | ICD-10-CM | POA: Diagnosis not present

## 2017-11-07 DIAGNOSIS — H2513 Age-related nuclear cataract, bilateral: Secondary | ICD-10-CM | POA: Diagnosis not present

## 2017-11-07 DIAGNOSIS — I639 Cerebral infarction, unspecified: Secondary | ICD-10-CM | POA: Diagnosis not present

## 2017-11-14 DIAGNOSIS — I639 Cerebral infarction, unspecified: Secondary | ICD-10-CM | POA: Diagnosis not present

## 2017-12-17 ENCOUNTER — Telehealth: Payer: Self-pay | Admitting: Physician Assistant

## 2017-12-17 NOTE — Telephone Encounter (Signed)
patient called to request order for CPAP supplies. tried to return patient's call for more details,  all number are non working. [phone], [phone], [phone].

## 2018-01-11 ENCOUNTER — Ambulatory Visit: Payer: Self-pay | Admitting: Physical Medicine & Rehabilitation

## 2018-01-11 ENCOUNTER — Encounter: Payer: BLUE CROSS/BLUE SHIELD | Attending: Physical Medicine & Rehabilitation

## 2018-01-11 DIAGNOSIS — I5032 Chronic diastolic (congestive) heart failure: Secondary | ICD-10-CM | POA: Insufficient documentation

## 2018-01-11 DIAGNOSIS — E559 Vitamin D deficiency, unspecified: Secondary | ICD-10-CM | POA: Insufficient documentation

## 2018-01-11 DIAGNOSIS — I110 Hypertensive heart disease with heart failure: Secondary | ICD-10-CM | POA: Insufficient documentation

## 2018-01-11 DIAGNOSIS — K219 Gastro-esophageal reflux disease without esophagitis: Secondary | ICD-10-CM | POA: Insufficient documentation

## 2018-01-11 DIAGNOSIS — Z87891 Personal history of nicotine dependence: Secondary | ICD-10-CM | POA: Insufficient documentation

## 2018-01-11 DIAGNOSIS — M7711 Lateral epicondylitis, right elbow: Secondary | ICD-10-CM | POA: Insufficient documentation

## 2018-01-11 DIAGNOSIS — R569 Unspecified convulsions: Secondary | ICD-10-CM | POA: Insufficient documentation

## 2018-01-11 DIAGNOSIS — I69351 Hemiplegia and hemiparesis following cerebral infarction affecting right dominant side: Secondary | ICD-10-CM | POA: Insufficient documentation

## 2018-01-11 DIAGNOSIS — E785 Hyperlipidemia, unspecified: Secondary | ICD-10-CM | POA: Insufficient documentation

## 2018-01-11 DIAGNOSIS — M792 Neuralgia and neuritis, unspecified: Secondary | ICD-10-CM | POA: Insufficient documentation

## 2018-01-18 NOTE — Progress Notes (Signed)
Complete Physical  Assessment and Plan:  Gastroesophageal reflux disease, esophagitis presence not specified -     H. pylori breath test - STOP ZANTAC SWITCH TO PROTONIX - DIET DISUCSSED  Screening, anemia, deficiency, iron -     Iron,Total/Total Iron Binding Cap -     Vitamin B12  Essential hypertension - continue medications, DASH diet, exercise and monitor at home. Call if greater than 130/80.  - CBC with Differential/Platelet - BASIC METABOLIC PANEL WITH GFR - Hepatic function panel - TSH - Urinalysis, Routine w reflex microscopic (not at [facility]) - Microalbumin / creatinine urine ratio   Prediabetes Discussed general issues about diabetes pathophysiology and management., Educational material distributed., Suggested low cholesterol diet., Encouraged aerobic exercise., Discussed foot care., Reminded to get yearly retinal exam. - Hemoglobin A1c - Insulin, fasting  Hyperlipidemia -continue medications, check lipids, decrease fatty foods, increase activity. - Lipid panel  Obesity Obesity with co morbidities- long discussion about weight loss, diet, and exercise  Chronic diastolic heart failure WEIGHT DOWN, CONTINUE WEIGHT LOSS   Mitral regurgitation Monitored by cardio, no SOB/CP  Vitamin D deficiency - Vit D  25 hydroxy (rtn osteoporosis monitoring)  Medication management - Magnesium  OSA on CPAP She is on CPAP   Brain aneurysm Control blood pressure, cholesterol, glucose, increase exercise.   Gout of foot, unspecified cause, unspecified chronicity, unspecified laterality - Gout- recheck Uric acid as needed, Diet discussed, continue medications.  Encounter for general adult medical examination with abnormal findings 1 year  BMI 31.0-31.9,adult  Overweight  - long discussion about weight loss, diet, and exercise -recommended diet heavy in fruits and veggies and low in animal meats, cheeses, and dairy products   Discussed med's effects and SE's. Screening  labs and tests as requested with regular follow-up as recommended. Over 40 minutes of exam, counseling, chart review, and complex, high level critical decision making was performed this visit.  Future Appointments  Date Time Provider [place]  01/23/2019 10:00 AM Vicie Mutters, PA-C [id number] None    HPI  62 y.o. female  presents for a complete physical.  Her blood pressure has been controlled at home, today their BP is BP: 136/82 She does workout, walks daily for 30 mins. She denies chest pain, shortness of breath, dizziness.   She is having hoarseness, on zanatc but it is not helping. Will test for H pylori and then treat with protonix.   She has history of CVA due to aneurysm, PVD and MR, follows with Dr. Sondra Come. She is on brilinta one a day, bASA.  Had stable MRA brain 06/2017.  She is on brilinta. She is on Keppra, last level was 07/2017 and she is on estrogen pills.   Last echo was 11/01/2016. Normal EF and moderate diastolic heart dysfunction.   She works at [employer], going to work 2 more years and retire. 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   She is not on cholesterol medication and denies myalgias. Her cholesterol is at goal. The cholesterol last visit was:   Lab Results  Component Value Date   CHOL 210 (H) 08/08/2017   HDL 109 08/08/2017   LDLCALC 78 08/08/2017   TRIG 135 08/08/2017   CHOLHDL 1.9 08/08/2017   She has been working on diet and exercise for prediabetes, and denies paresthesia of the feet, polydipsia, polyuria and visual disturbances. Last A1C in the office was:  Lab Results  Component Value Date   HGBA1C 5.3 01/05/2017   She  has OSA and is on CPAP, she is on protonix for GERD.  Patient is NOT on allopurinol for gout, she is on colchicine and does not report a recent flare.  Lab Results  Component Value Date   LABURIC 4.2 10/23/2014   BMI is Body mass index is 30.91 kg/m., she is working on diet  and exercise.  Wt Readings from Last 3 Encounters:  01/21/18 215 lb 6.4 oz (97.7 kg)  10/16/17 224 lb (101.6 kg)  08/08/17 223 lb (101.2 kg)   Patient is on Vitamin D supplement.   Lab Results  Component Value Date   VD25OH 22 (L) 08/08/2017      Current Medications:  Current Outpatient Medications on File Prior to Visit  Medication Sig Dispense Refill  . amLODipine (NORVASC) 10 MG tablet TAKE (1) TABLET BY MOUTH DAILY 90 tablet 10  . aspirin EC 81 MG tablet Take 1 tablet (81 mg total) by mouth daily.    . bisoprolol-hydrochlorothiazide (ZIAC) 5-6.25 MG tablet Take 1 tablet by mouth daily. 30 tablet 3  . BRILINTA 90 MG TABS tablet TAKE ONE (1) TABLET BY MOUTH TWICE DAILY 90 tablet 11  . Cholecalciferol 5000 units capsule Take 1 capsule (5,000 Units total) by mouth daily.    . diclofenac sodium (VOLTAREN) 1 % GEL Apply 2 g topically 4 (four) times daily. Right wrist and Right elbow 3 Tube 2  . estrogens, conjugated, (PREMARIN) 0.9 MG tablet TAKE (1) TABLET BY MOUTH DAILY 90 tablet 10  . ferrous sulfate 325 (65 FE) MG tablet Take 325 mg by mouth daily with breakfast.    . furosemide (LASIX) 40 MG tablet TAKE (1) TABLET BY MOUTH DAILY AS NEEDED FOR EDEMA 90 tablet 10  . gabapentin (NEURONTIN) 100 MG capsule TAKE 1 CAPSULE BY MOUTH THREE TIMES DAILY 90 capsule 1  . gabapentin (NEURONTIN) 600 MG tablet Take 1 tablet (600 mg total) by mouth at bedtime. 30 tablet 1  . levETIRAcetam (KEPPRA) 1000 MG tablet TAKE 1 TABLET BY MOUTH TWICE DAILY 180 tablet 10  . magnesium gluconate (MAGONATE) 500 MG tablet Take 500 mg by mouth daily.    Marland Kitchen olmesartan (BENICAR) 40 MG tablet TAKE (1) TABLET BY MOUTH DAILY 90 tablet 10  . Omega-3 Fatty Acids (FISH OIL) 1200 MG CAPS Take 2,400 mg by mouth daily.     . ranitidine (ZANTAC) 150 MG tablet Take 1 tablet (150 mg total) by mouth 2 (two) times daily. For acid reflux. Take 30-60 min before meal. 180 tablet 1  . vitamin B-12 (CYANOCOBALAMIN) 1000 MCG tablet Take  1,000 mcg by mouth daily.    . Calcium Carb-Cholecalciferol (CALCIUM 600 + D PO) Take 1 tablet by mouth daily.    . cyclobenzaprine (FLEXERIL) 10 MG tablet TAKE 1 TABLET BY MOUTH AT BEDTIME AS NEEDED FOR MUSCLE SPASMS 90 tablet 10  . Multiple Vitamins-Minerals (MULTIVITAMIN WITH MINERALS) tablet Take 1 tablet by mouth daily.     No current facility-administered medications on file prior to visit.    Health Maintenance:   Immunization History  Administered Date(s) Administered  . Influenza Split 11/11/2013, 10/23/2014  . Influenza, Seasonal, Injecte, Preservative Fre 10/26/2015  . Influenza,inj,Quad PF,6+ Mos 02/23/2017  . Influenza-Unspecified 11/25/2012  . Tdap 08/25/2013   Tetanus: 2015 Pneumovax: N/A Prevnar 13: N/A Flu vaccine: DUE Zostavax: N/A LMP: TAH Pap: TAH  MGM:02/2016  DEXA: N/A Colonoscopy: 2011, due 10 years Dr. Collene Mares EGD: N/A CT head 2010 CXR 2015 MRA 08/2014 Renal US 2017 Echo 10/2016,  normal EF, mil-mod AR  Last Dental Exam: Has partial/bridges, no dentist.  [facility], wears [facility], 06/2016, goes yearly Patient Care Team: Unk Pinto, MD as PCP - General (Internal Medicine) Unk Pinto, MD as PCP - Internal Medicine (Internal Medicine) Sharmon Revere as Physician Assistant (Physician Assistant) Juanita Craver, MD as Consulting Physician (Gastroenterology) Mcarthur Rossetti, MD as Consulting Physician (Orthopedic Surgery) Unk Pinto, MD as Consulting Physician (Internal Medicine) Luanne Bras, MD as Consulting Physician (Interventional Radiology)  Medical History:  Past Medical History:  Diagnosis Date  . Anemia   . Arthritis   . Chronic diastolic heart failure ([place])   . GERD (gastroesophageal reflux disease)   . Heart murmur   . Hyperlipidemia   . Hypertension may, 1973  . Mitral regurgitation    a. Echo 01/2012 mild LVH, EF 55-65%, Gr 2 DD, mod MR, mild LAE, PASP 36;  b. Echo (02/2013):  EF 55-60%,  Gr 1 DD, mild to mod MR, mild LAE (no sig change since prior echo) // c. Echo 10/18: EF 55-60, normal wall motion, grade 2 diastolic dysfunction, trivial AI, mild to moderate MR, mild LAE, trivial PI, PASP 38    . OSA on CPAP    7 yrs  . Peripheral vascular disease ([place])    cerebral aneursym  . Seizures ([place]) 01/2017  . Stroke ([place]) 07   no weakness  . Subarachnoid hemorrhage due to ruptured aneurysm ([place])    2003 - s/p coiling  . Unspecified vitamin D deficiency    Allergies Allergies  Allergen Reactions  . Ace Inhibitors Nausea And Vomiting  . Aspirin Nausea And Vomiting    Can take coated Aspirin   SURGICAL HISTORY She  has a past surgical history that includes Aneurysm coiling; head surgery; Abdominal hysterectomy; Tonsillectomy and adenoidectomy; Radiology with anesthesia (N/A, 04/23/2013); IR 3D Independent Wkst (09/04/2016); IR Angiogram Extremity Left (09/04/2016); IR ANGIO INTRA EXTRACRAN SEL COM CAROTID INNOMINATE BILAT MOD SED (09/04/2016); IR ANGIO VERTEBRAL SEL VERTEBRAL BILAT MOD SED (09/04/2016); IR Radiologist Eval & Mgmt (10/11/2016); Radiology with anesthesia (N/A, 10/30/2016); Tubal ligation; Radiology with anesthesia (N/A, 02/21/2017); IR Transcath/Emboliz (02/21/2017); IR ANGIO INTRA EXTRACRAN SEL INTERNAL CAROTID UNI L MOD SED (02/21/2017); IR NEURO EACH ADD'L AFTER BASIC UNI LEFT (MS) (02/21/2017); IR Angiogram Follow Up Study (02/21/2017); and IR Radiologist Eval & Mgmt (03/07/2017).   FAMILY HISTORY Her family history includes Hypertension in her father, sister, sister, and sister; Kidney disease in her father.   SOCIAL HISTORY She  reports that she quit smoking about 19 years ago. Her smoking use included cigarettes. She has a 0.75 pack-year smoking history. She has never used smokeless tobacco. She reports that she does not drink alcohol or use drugs.  Review of Systems: Review of Systems  Constitutional: Negative.   HENT: Negative.   Respiratory: Positive for cough  (dry). Negative for hemoptysis, sputum production, shortness of breath and wheezing.   Cardiovascular: Negative.  Negative for chest pain, palpitations and leg swelling (resolved).  Gastrointestinal: Positive for heartburn. Negative for abdominal pain, blood in stool, constipation, diarrhea, melena, nausea and vomiting.  Genitourinary: Negative.   Musculoskeletal: Positive for back pain and joint pain. Negative for falls, myalgias and neck pain.  Skin: Negative.   Neurological: Negative.  Negative for dizziness and headaches.  Psychiatric/Behavioral: Negative.  Negative for depression, hallucinations, memory loss, substance abuse and suicidal ideas. The patient is not nervous/anxious and does not have insomnia.     Physical Exam: Estimated body  mass index is 30.91 kg/m as calculated from the following:   Height as of this encounter: 5\' 10"  (1.778 m).   Weight as of this encounter: 215 lb 6.4 oz (97.7 kg). BP 136/82   Pulse 66   Temp (!) 96.3 F (35.7 C)   Ht 5\' 10"  (1.778 m)   Wt 215 lb 6.4 oz (97.7 kg)   SpO2 99%   BMI 30.91 kg/m  General Appearance: Well nourished, in no apparent distress.  Eyes: PERRLA, EOMs, conjunctiva no swelling or erythema, normal fundi and vessels.  Sinuses: No Frontal/maxillary tenderness  ENT/Mouth: Ext aud canals clear, normal light reflex with TMs without erythema, bulging. Good dentition. No erythema, swelling, or exudate on post pharynx. Tonsils not swollen or erythematous. Hearing normal.  Neck: Supple, thyroid slightly enlarged. No bruits  Respiratory: Respiratory effort normal, BS equal bilaterally without rales, rhonchi, wheezing or stridor.  Cardio: RRR with holosystolic murmur LSB, without rubs or gallops. Brisk peripheral pulses with 2+ edema.  Chest: symmetric, with normal excursions and percussion.  Breasts: declines  Abdomen: Soft, nontender, no guarding, rebound, hernias, masses, or organomegaly.  Lymphatics: Non tender without  lymphadenopathy.  Genitourinary: defer Musculoskeletal: Full ROM all peripheral extremities,5/5 strength, and antalgic gait.  Skin: Warm, dry without rashes, lesions, ecchymosis. Neuro: Cranial nerves intact, reflexes equal bilaterally. Normal muscle tone, no cerebellar symptoms. Sensation intact.  Psych: Awake and oriented X 3, normal affect, Insight and Judgment appropriate.   EKG: defer. AORTA SCAN: defer  Vicie Mutters 10:27 AM [facility]

## 2018-01-21 ENCOUNTER — Encounter: Payer: Self-pay | Admitting: Physician Assistant

## 2018-01-21 ENCOUNTER — Ambulatory Visit (INDEPENDENT_AMBULATORY_CARE_PROVIDER_SITE_OTHER): Payer: BLUE CROSS/BLUE SHIELD | Admitting: Physician Assistant

## 2018-01-21 VITALS — BP 136/82 | HR 66 | Temp 96.3°F | Ht 70.0 in | Wt 215.4 lb

## 2018-01-21 DIAGNOSIS — R569 Unspecified convulsions: Secondary | ICD-10-CM

## 2018-01-21 DIAGNOSIS — Z1329 Encounter for screening for other suspected endocrine disorder: Secondary | ICD-10-CM | POA: Diagnosis not present

## 2018-01-21 DIAGNOSIS — Z0000 Encounter for general adult medical examination without abnormal findings: Secondary | ICD-10-CM

## 2018-01-21 DIAGNOSIS — I671 Cerebral aneurysm, nonruptured: Secondary | ICD-10-CM

## 2018-01-21 DIAGNOSIS — Z79899 Other long term (current) drug therapy: Secondary | ICD-10-CM | POA: Diagnosis not present

## 2018-01-21 DIAGNOSIS — E559 Vitamin D deficiency, unspecified: Secondary | ICD-10-CM | POA: Diagnosis not present

## 2018-01-21 DIAGNOSIS — R7303 Prediabetes: Secondary | ICD-10-CM

## 2018-01-21 DIAGNOSIS — Z1389 Encounter for screening for other disorder: Secondary | ICD-10-CM | POA: Diagnosis not present

## 2018-01-21 DIAGNOSIS — Z9989 Dependence on other enabling machines and devices: Secondary | ICD-10-CM

## 2018-01-21 DIAGNOSIS — Z13220 Encounter for screening for lipoid disorders: Secondary | ICD-10-CM | POA: Diagnosis not present

## 2018-01-21 DIAGNOSIS — Z131 Encounter for screening for diabetes mellitus: Secondary | ICD-10-CM | POA: Diagnosis not present

## 2018-01-21 DIAGNOSIS — K219 Gastro-esophageal reflux disease without esophagitis: Secondary | ICD-10-CM

## 2018-01-21 DIAGNOSIS — E785 Hyperlipidemia, unspecified: Secondary | ICD-10-CM

## 2018-01-21 DIAGNOSIS — Z130 Encounter for screening for diseases of the blood and blood-forming organs and certain disorders involving the immune mechanism: Secondary | ICD-10-CM | POA: Diagnosis not present

## 2018-01-21 DIAGNOSIS — I10 Essential (primary) hypertension: Secondary | ICD-10-CM

## 2018-01-21 DIAGNOSIS — I5032 Chronic diastolic (congestive) heart failure: Secondary | ICD-10-CM

## 2018-01-21 DIAGNOSIS — G4733 Obstructive sleep apnea (adult) (pediatric): Secondary | ICD-10-CM

## 2018-01-21 DIAGNOSIS — M109 Gout, unspecified: Secondary | ICD-10-CM

## 2018-01-21 DIAGNOSIS — I340 Nonrheumatic mitral (valve) insufficiency: Secondary | ICD-10-CM

## 2018-01-21 NOTE — Patient Instructions (Addendum)
VITAMIN D IS IMPORTANT  Vitamin D goal is between 60-80  Please make sure that you are taking your Vitamin D as directed.   It is very important as a natural anti-inflammatory   helping hair, skin, and nails, as well as reducing stroke and heart attack risk.   It helps your bones and helps with mood.  We want you on at least 5000 IU daily  It also decreases numerous cancer risks so please take it as directed.   Low Vit D is associated with a 200-300% higher risk for CANCER   and 200-300% higher risk for HEART   ATTACK  &  STROKE.    .....................................Marland Kitchen  It is also associated with higher death rate at younger ages,   autoimmune diseases like Rheumatoid arthritis, Lupus, Multiple Sclerosis.     Also many other serious conditions, like depression, Alzheimer's  Dementia, infertility, muscle aches, fatigue, fibromyalgia - just to name a few.  +++++++++++++++++++  Can get liquid vitamin D from [facility] here in [place] at  [facility] alternatives [address] [place], [place] [address] Or you can try earth fare   [place]  7 a.m.-6:30 p.m., Monday 7 a.m.-5 p.m., Tuesday-Friday Schedule an appointment by calling [phone].   Encourage you to get the 3D Mammogram  The 3D Mammogram is much more specific and sensitive to pick up breast cancer. For women with fibrocystic breast or lumpy breast it can be hard to determine if it is cancer or not but the 3D mammogram is able to tell this difference which cuts back on unneeded additional tests or scary call backs.   - over 40% increase in detection of breast cancer - over 40% reduction in false positives.  - fewer call backs - reduced anxiety - improved outcomes - [facility]  Stop the zantac, can take protonix that I sent in  Food Choices for Gastroesophageal Reflux Disease, Adult When you have gastroesophageal reflux disease (GERD), the  foods you eat and your eating habits are very important. Choosing the right foods can help ease the discomfort of GERD. Consider working with a diet and nutrition specialist (dietitian) to help you make healthy food choices. What general guidelines should I follow?  Eating plan  Choose healthy foods low in fat, such as fruits, vegetables, whole grains, low-fat dairy products, and lean meat, fish, and poultry.  Eat frequent, small meals instead of three large meals each day. Eat your meals slowly, in a relaxed setting. Avoid bending over or lying down until 2-3 hours after eating.  Limit high-fat foods such as fatty meats or fried foods.  Limit your intake of oils, butter, and shortening to less than 8 teaspoons each day.  Avoid the following: ? Foods that cause symptoms. These may be different for different people. Keep a food diary to keep track of foods that cause symptoms. ? Alcohol. ? Drinking large amounts of liquid with meals. ? Eating meals during the 2-3 hours before bed.  Cook foods using methods other than frying. This may include baking, grilling, or broiling. Lifestyle  Maintain a healthy weight. Ask your health care provider what weight is healthy for you. If you need to lose weight, work with your health care provider to do so safely.  Exercise for at least 30 minutes on 5 or more days each week, or as told by your health care provider.  Avoid wearing clothes that fit tightly  around your waist and chest.  Do not use any products that contain nicotine or tobacco, such as cigarettes and e-cigarettes. If you need help quitting, ask your health care provider.  Sleep with the head of your bed raised. Use a wedge under the mattress or blocks under the bed frame to raise the head of the bed. What foods are not recommended? The items listed may not be a complete list. Talk with your dietitian about what dietary choices are best for you. Grains Pastries or quick breads with  added fat. [place] toast. Vegetables Deep fried vegetables. [place] fries. Any vegetables prepared with added fat. Any vegetables that cause symptoms. For some people this may include tomatoes and tomato products, chili peppers, onions and garlic, and horseradish. Fruits Any fruits prepared with added fat. Any fruits that cause symptoms. For some people this may include citrus fruits, such as oranges, grapefruit, pineapple, and lemons. Meats and other protein foods High-fat meats, such as fatty beef or pork, hot dogs, ribs, ham, sausage, salami and bacon. Fried meat or protein, including fried fish and fried chicken. Nuts and nut butters. Dairy Whole milk and chocolate milk. Sour cream. Cream. Ice cream. Cream cheese. Milk shakes. Beverages Coffee and tea, with or without caffeine. Carbonated beverages. Sodas. Energy drinks. Fruit juice made with acidic fruits (such as orange or grapefruit). Tomato juice. Alcoholic drinks. Fats and oils Butter. Margarine. Shortening. Ghee. Sweets and desserts Chocolate and cocoa. Donuts. Seasoning and other foods Pepper. Peppermint and spearmint. Any condiments, herbs, or seasonings that cause symptoms. For some people, this may include curry, hot sauce, or vinegar-based salad dressings. Summary  When you have gastroesophageal reflux disease (GERD), food and lifestyle choices are very important to help ease the discomfort of GERD.  Eat frequent, small meals instead of three large meals each day. Eat your meals slowly, in a relaxed setting. Avoid bending over or lying down until 2-3 hours after eating.  Limit high-fat foods such as fatty meat or fried foods. This information is not intended to replace advice given to you by your health care provider. Make sure you discuss any questions you have with your health care provider. Document Released: 01/09/2005 Document Revised: 01/11/2016 Document Reviewed: 01/11/2016 Elsevier Interactive Patient Education  [employer].      When it comes to diets, agreement about the perfect plan isn't easy to find, even among the experts. Experts at the [employer] developed an idea known as the Healthy Eating Plate. Just imagine a plate divided into logical, healthy portions.  The emphasis is on diet quality:  Load up on vegetables and fruits - one-half of your plate: Aim for color and variety, and remember that potatoes don't count.  Go for whole grains - one-quarter of your plate: Whole wheat, barley, wheat berries, quinoa, oats, brown rice, and foods made with them. If you want pasta, go with whole wheat pasta.  Protein power - one-quarter of your plate: Fish, chicken, beans, and nuts are all healthy, versatile protein sources. Limit red meat.  The diet, however, does go beyond the plate, offering a few other suggestions.  Use healthy plant oils, such as olive, canola, soy, corn, sunflower and peanut. Check the labels, and avoid partially hydrogenated oil, which have unhealthy trans fats.  If you're thirsty, drink water. Coffee and tea are good in moderation, but skip sugary drinks and limit milk and dairy products to one or two daily servings.  The type of carbohydrate in  the diet is more important than the amount. Some sources of carbohydrates, such as vegetables, fruits, whole grains, and beans-are healthier than others.  Finally, stay active.

## 2018-01-22 LAB — URINALYSIS, ROUTINE W REFLEX MICROSCOPIC
BILIRUBIN URINE: NEGATIVE
Bacteria, UA: NONE SEEN /HPF
Glucose, UA: NEGATIVE
Hgb urine dipstick: NEGATIVE
Hyaline Cast: NONE SEEN /LPF
KETONES UR: NEGATIVE
Leukocytes, UA: NEGATIVE
NITRITE: NEGATIVE
RBC / HPF: NONE SEEN /HPF (ref 0–2)
Specific Gravity, Urine: 1.014 (ref 1.001–1.03)
WBC, UA: NONE SEEN /HPF (ref 0–5)
pH: <=5 (ref 5.0–8.0)

## 2018-01-22 LAB — CBC WITH DIFFERENTIAL/PLATELET
ABSOLUTE MONOCYTES: 431 {cells}/uL (ref 200–950)
BASOS ABS: 37 {cells}/uL (ref 0–200)
BASOS PCT: 0.5 %
EOS ABS: 219 {cells}/uL (ref 15–500)
Eosinophils Relative: 3 %
HEMATOCRIT: 30.2 % — AB (ref 35.0–45.0)
HEMOGLOBIN: 10.2 g/dL — AB (ref 11.7–15.5)
LYMPHS ABS: 2467 {cells}/uL (ref 850–3900)
MCH: 27.6 pg (ref 27.0–33.0)
MCHC: 33.8 g/dL (ref 32.0–36.0)
MCV: 81.8 fL (ref 80.0–100.0)
MPV: 9.9 fL (ref 7.5–12.5)
Monocytes Relative: 5.9 %
Neutro Abs: 4146 cells/uL (ref 1500–7800)
Neutrophils Relative %: 56.8 %
Platelets: 306 10*3/uL (ref 140–400)
RBC: 3.69 10*6/uL — ABNORMAL LOW (ref 3.80–5.10)
RDW: 14.7 % (ref 11.0–15.0)
Total Lymphocyte: 33.8 %
WBC: 7.3 10*3/uL (ref 3.8–10.8)

## 2018-01-22 LAB — COMPLETE METABOLIC PANEL WITH GFR
AG Ratio: 1.2 (calc) (ref 1.0–2.5)
ALKALINE PHOSPHATASE (APISO): 68 U/L (ref 33–130)
ALT: 5 U/L — ABNORMAL LOW (ref 6–29)
AST: 12 U/L (ref 10–35)
Albumin: 4.2 g/dL (ref 3.6–5.1)
BILIRUBIN TOTAL: 0.3 mg/dL (ref 0.2–1.2)
BUN / CREAT RATIO: 14 (calc) (ref 6–22)
BUN: 15 mg/dL (ref 7–25)
CHLORIDE: 106 mmol/L (ref 98–110)
CO2: 28 mmol/L (ref 20–32)
Calcium: 10 mg/dL (ref 8.6–10.4)
Creat: 1.05 mg/dL — ABNORMAL HIGH (ref 0.50–0.99)
GFR, EST AFRICAN AMERICAN: 66 mL/min/{1.73_m2} (ref >=60)
GFR, Est Non African American: 57 mL/min/{1.73_m2} — ABNORMAL LOW (ref >=60)
GLOBULIN: 3.5 g/dL (ref 1.9–3.7)
Glucose, Bld: 82 mg/dL (ref 65–99)
Potassium: 4.3 mmol/L (ref 3.5–5.3)
SODIUM: 139 mmol/L (ref 135–146)
TOTAL PROTEIN: 7.7 g/dL (ref 6.1–8.1)

## 2018-01-22 LAB — TSH: TSH: 2.37 mIU/L (ref 0.40–4.50)

## 2018-01-22 LAB — HEMOGLOBIN A1C
Hgb A1c MFr Bld: 5.4 % of total Hgb (ref <5.7)
Mean Plasma Glucose: 108 (calc)
eAG (mmol/L): 6 (calc)

## 2018-01-22 LAB — IRON, TOTAL/TOTAL IRON BINDING CAP
%SAT: 16 % (calc) (ref 16–45)
Iron: 59 ug/dL (ref 45–160)
TIBC: 373 mcg/dL (calc) (ref 250–450)

## 2018-01-22 LAB — LIPID PANEL
Cholesterol: 188 mg/dL (ref <200)
HDL: 103 mg/dL (ref >50)
LDL CHOLESTEROL (CALC): 69 mg/dL
NON-HDL CHOLESTEROL (CALC): 85 mg/dL (ref <130)
Total CHOL/HDL Ratio: 1.8 (calc) (ref <5.0)
Triglycerides: 84 mg/dL (ref <150)

## 2018-01-22 LAB — MICROALBUMIN / CREATININE URINE RATIO
Creatinine, Urine: 111 mg/dL (ref 20–275)
Microalb Creat Ratio: 238 mcg/mg creat — ABNORMAL HIGH (ref <30)
Microalb, Ur: 26.4 mg/dL

## 2018-01-22 LAB — MAGNESIUM: Magnesium: 1.3 mg/dL — ABNORMAL LOW (ref 1.5–2.5)

## 2018-01-22 LAB — VITAMIN D 25 HYDROXY (VIT D DEFICIENCY, FRACTURES): Vit D, 25-Hydroxy: 20 ng/mL — ABNORMAL LOW (ref 30–100)

## 2018-01-22 LAB — VITAMIN B12: Vitamin B-12: 857 pg/mL (ref 200–1100)

## 2018-01-22 LAB — H. PYLORI BREATH TEST: H. pylori Breath Test: NOT DETECTED

## 2018-01-23 ENCOUNTER — Other Ambulatory Visit: Payer: Self-pay | Admitting: Physician Assistant

## 2018-01-23 MED ORDER — PANTOPRAZOLE SODIUM 40 MG PO TBEC: 40.0000 mg | DELAYED_RELEASE_TABLET | Freq: Every day | ORAL | 1 refills | Status: DC

## 2018-01-25 ENCOUNTER — Encounter: Payer: Self-pay | Admitting: *Deleted

## 2018-01-29 ENCOUNTER — Other Ambulatory Visit (HOSPITAL_COMMUNITY): Payer: Self-pay | Admitting: Interventional Radiology

## 2018-01-29 DIAGNOSIS — I729 Aneurysm of unspecified site: Secondary | ICD-10-CM

## 2018-02-13 ENCOUNTER — Ambulatory Visit (HOSPITAL_COMMUNITY): Payer: BLUE CROSS/BLUE SHIELD

## 2018-02-13 ENCOUNTER — Ambulatory Visit (HOSPITAL_COMMUNITY)
Admission: RE | Admit: 2018-02-13 | Discharge: 2018-02-13 | Disposition: A | Discharge status: Home / Self Care | Payer: BLUE CROSS/BLUE SHIELD | Source: Ambulatory Visit | Attending: Interventional Radiology | Admitting: Interventional Radiology

## 2018-02-13 DIAGNOSIS — I729 Aneurysm of unspecified site: Secondary | ICD-10-CM

## 2018-02-13 DIAGNOSIS — I671 Cerebral aneurysm, nonruptured: Secondary | ICD-10-CM | POA: Diagnosis not present

## 2018-02-14 ENCOUNTER — Telehealth (HOSPITAL_COMMUNITY): Payer: Self-pay

## 2018-02-14 NOTE — Telephone Encounter (Signed)
Left message for pt to f/u in 1 year. AW

## 2018-03-13 DIAGNOSIS — Z1231 Encounter for screening mammogram for malignant neoplasm of breast: Secondary | ICD-10-CM | POA: Diagnosis not present

## 2018-03-13 LAB — HM MAMMOGRAPHY

## 2018-03-26 ENCOUNTER — Encounter: Payer: Self-pay | Admitting: Internal Medicine

## 2018-03-26 DIAGNOSIS — N6002 Solitary cyst of left breast: Secondary | ICD-10-CM | POA: Diagnosis not present

## 2018-04-02 ENCOUNTER — Other Ambulatory Visit: Payer: Self-pay | Admitting: Physician Assistant

## 2018-04-16 ENCOUNTER — Encounter: Payer: Self-pay | Admitting: Cardiovascular Disease

## 2018-04-16 DIAGNOSIS — G4733 Obstructive sleep apnea (adult) (pediatric): Secondary | ICD-10-CM | POA: Diagnosis not present

## 2018-04-19 ENCOUNTER — Telehealth: Payer: Self-pay

## 2018-04-19 NOTE — Telephone Encounter (Signed)
Left message for pt to call back about appt.

## 2018-04-24 ENCOUNTER — Other Ambulatory Visit: Payer: Self-pay | Admitting: Physician Assistant

## 2018-04-25 ENCOUNTER — Ambulatory Visit: Payer: BLUE CROSS/BLUE SHIELD | Admitting: Cardiovascular Disease

## 2018-05-09 ENCOUNTER — Other Ambulatory Visit: Payer: Self-pay | Admitting: Physician Assistant

## 2018-05-28 ENCOUNTER — Other Ambulatory Visit: Payer: Self-pay | Admitting: Physician Assistant

## 2018-07-19 NOTE — Progress Notes (Signed)
6 month OV  Assessment and Plan: Essential hypertension - continue medications, DASH diet, exercise and monitor at home. Call if greater than 130/80.  - CBC with Differential/Platelet - BASIC METABOLIC PANEL WITH GFR - Hepatic function panel - TSH  Hyperlipidemia -continue medications, check lipids, decrease fatty foods, increase activity. - Lipid panel  Obesity Obesity with co morbidities- long discussion about weight loss, diet, and exercise  Chronic diastolic heart failure decrease salt/sugar, monitor weight, BP stable.   Vitamin D deficiency - Vit D  25 hydroxy (rtn osteoporosis monitoring)  Medication management - Magnesium  OSA on CPAP  weight loss advised.  Very important with heart/CVA history to GET on CPAP Follow up to get different mask refitted  CVA with right shoulder pain/weakness Follow up with PT  Myalgias B12 normal, check iron/magnesium Continue water Increase exercise tylenol is okay to take as needed  Discussed med's effects and SE's. Screening labs and tests as requested with regular follow-up as recommended. Over 30 minutes of exam, counseling, chart review, and complex, high level critical decision making was performed this visit.  Future Appointments  Date Time Provider [place]  07/22/2018 10:45 AM Vicie Mutters, PA-C [facility] None  07/29/2018  8:40 AM Nahser, Wonda Cheng, MD [facility]  01/23/2019 10:00 AM Vicie Mutters, PA-C [facility] None    HPI  63 y.o. AA female  presents for follow up OV for PVD, CVA, HTN, chol.   Her blood pressure has not been checked at home, she states last time checked was before the surgery at home, she states then it was 120's, she takes all of he meds at night, she is on lasix 40 AS NEEDED AND DOES NOT TAKE OFTEN, norvasc 10mg , benciar 40mg , today their BP is BP: 130/66.   She is on her CPAP but states it makes her cough, has air coming out the side of the mask- informed to call her  supplier.  Also complains of bilateral leg cramping occ., not on magnesium  She does workout, walks daily for 30 mins. She denies chest pain, shortness of breath, dizziness.   BMI is Body mass index is 31.74 kg/m., she is working on diet and exercise. Wt Readings from Last 3 Encounters:  07/22/18 221 lb 3.2 oz (100.3 kg)  01/21/18 215 lb 6.4 oz (97.7 kg)  10/16/17 224 lb (101.6 kg)     She was seen for left ICA aneursym for a pipeline stent but had a seizure and AMS after her procedure, she is on brilinta, following with Dr. Corena Pilgrim, she is still on [place]. She states she is doing well with it now. She continues to have right arm pain/weakness and states lately she has been having decreased grip in right hand, dropping things at work. Needs to make an appointment with Dr. Letta Pate.   She works at [employer], going to work 2 more years and retire next year. 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   She is not on cholesterol medication and denies myalgias. Her cholesterol is at goal. The cholesterol last visit was:   Lab Results  Component Value Date   CHOL 188 01/21/2018   HDL 103 01/21/2018   LDLCALC 69 01/21/2018   TRIG 84 01/21/2018   CHOLHDL 1.8 01/21/2018   She has been working on diet and exercise for prediabetes, and denies paresthesia of the feet, polydipsia,  polyuria and visual disturbances. Last A1C in the office was:  Lab Results  Component Value Date   HGBA1C 5.4 01/21/2018   Patient is NOT on allopurinol for gout, she is on colchicine and does not report a recent flare.  Lab Results  Component Value Date   LABURIC 4.2 10/23/2014   Patient is on Vitamin D supplement, takes 2 pills a day, unknown dose but thinks it is 5000.    Lab Results  Component Value Date   VD25OH 20 (L) 01/21/2018      Current Medications:  Current Outpatient  Medications on File Prior to Visit  Medication Sig Dispense Refill  . amLODipine (NORVASC) 10 MG tablet TAKE (1) TABLET BY MOUTH DAILY 90 tablet 10  . aspirin EC 81 MG tablet Take 1 tablet (81 mg total) by mouth daily.    . bisoprolol-hydrochlorothiazide (ZIAC) 5-6.25 MG tablet Take 1 tablet by mouth daily. 30 tablet 3  . BRILINTA 90 MG TABS tablet TAKE ONE (1) TABLET BY MOUTH TWICE DAILY 90 tablet 1  . Calcium Carb-Cholecalciferol (CALCIUM 600 + D PO) Take 1 tablet by mouth daily.    . Cholecalciferol 5000 units capsule Take 1 capsule (5,000 Units total) by mouth daily.    . cyclobenzaprine (FLEXERIL) 10 MG tablet TAKE 1 TABLET BY MOUTH AT BEDTIME AS NEEDED FOR MUSCLE SPASMS 90 tablet 10  . diclofenac sodium (VOLTAREN) 1 % GEL Apply 2 g topically 4 (four) times daily. Right wrist and Right elbow 3 Tube 2  . estrogens, conjugated, (PREMARIN) 0.9 MG tablet TAKE (1) TABLET BY MOUTH DAILY 90 tablet 10  . ferrous sulfate 325 (65 FE) MG tablet Take 325 mg by mouth daily with breakfast.    . furosemide (LASIX) 40 MG tablet TAKE (1) TABLET BY MOUTH DAILY AS NEEDED FOR EDEMA 90 tablet 10  . gabapentin (NEURONTIN) 100 MG capsule TAKE 1 CAPSULE BY MOUTH THREE TIMES DAILY 90 capsule 1  . gabapentin (NEURONTIN) 600 MG tablet Take 1 tablet (600 mg total) by mouth at bedtime. 30 tablet 1  . levETIRAcetam (KEPPRA) 1000 MG tablet TAKE 1 TABLET BY MOUTH TWICE DAILY 180 tablet 10  . magnesium gluconate (MAGONATE) 500 MG tablet Take 500 mg by mouth daily.    . Multiple Vitamins-Minerals (MULTIVITAMIN WITH MINERALS) tablet Take 1 tablet by mouth daily.    Marland Kitchen olmesartan (BENICAR) 40 MG tablet TAKE (1) TABLET BY MOUTH DAILY 90 tablet 10  . Omega-3 Fatty Acids (FISH OIL) 1200 MG CAPS Take 2,400 mg by mouth daily.     . pantoprazole (PROTONIX) 40 MG tablet Take 1 tablet by mouth daily 90 tablet 0  . vitamin B-12 (CYANOCOBALAMIN) 1000 MCG tablet Take 1,000 mcg by mouth daily.     No current facility-administered  medications on file prior to visit.     Medical History:  Past Medical History:  Diagnosis Date  . Anemia   . Arthritis   . Chronic diastolic heart failure ([place])   . GERD (gastroesophageal reflux disease)   . Heart murmur   . Hyperlipidemia   . Hypertension may, 1973  . Mitral regurgitation    a. Echo 01/2012 mild LVH, EF 55-65%, Gr 2 DD, mod MR, mild LAE, PASP 36;  b. Echo (02/2013):  EF 55-60%, Gr 1 DD, mild to mod MR, mild LAE (no sig change since prior echo) // c. Echo 10/18: EF 55-60, normal wall motion, grade 2 diastolic dysfunction, trivial AI, mild to moderate MR, mild LAE, trivial PI, PASP  38    . OSA on CPAP    7 yrs  . Peripheral vascular disease ([place])    cerebral aneursym  . Seizures ([place]) 01/2017  . Stroke ([place]) 07   no weakness  . Subarachnoid hemorrhage due to ruptured aneurysm ([place])    2003 - s/p coiling  . Unspecified vitamin D deficiency    Allergies Allergies  Allergen Reactions  . Ace Inhibitors Nausea And Vomiting  . Aspirin Nausea And Vomiting    Can take coated Aspirin   Surgical History: reviewed and unchanged Family History: reviewed and unchanged Social History: reviewed and unchanged  Review of Systems: Review of Systems  Constitutional: Negative.   HENT: Negative.   Respiratory: Negative for cough, hemoptysis, sputum production, shortness of breath and wheezing.   Cardiovascular: Negative.  Negative for chest pain, palpitations and leg swelling (resolved).  Gastrointestinal: Negative for abdominal pain, blood in stool, constipation, diarrhea, heartburn, melena, nausea and vomiting.  Genitourinary: Negative.   Musculoskeletal: Negative for back pain, falls, joint pain, myalgias and neck pain.  Skin: Negative.   Neurological: Negative.  Negative for dizziness and headaches.  Psychiatric/Behavioral: Negative.  Negative for depression, hallucinations, memory loss, substance abuse and suicidal ideas. The patient is not nervous/anxious and does not  have insomnia.     Physical Exam: Estimated body mass index is 31.74 kg/m as calculated from the following:   Height as of this encounter: 5\' 10"  (1.778 m).   Weight as of this encounter: 221 lb 3.2 oz (100.3 kg). BP 130/66   Pulse 71   Temp 97.6 F (36.4 C)   Ht 5\' 10"  (1.778 m)   Wt 221 lb 3.2 oz (100.3 kg)   SpO2 99%   BMI 31.74 kg/m  General Appearance: Well nourished, in no apparent distress.  Eyes: PERRLA, EOMs, conjunctiva no swelling or erythema, normal fundi and vessels.  Sinuses: No Frontal/maxillary tenderness  ENT/Mouth: Ext aud canals clear, normal light reflex with TMs without erythema, bulging. Good dentition. No erythema, swelling, or exudate on post pharynx. Tonsils not swollen or erythematous. Hearing normal.  Neck: Supple, thyroid slightly enlarged. No bruits  Respiratory: Respiratory effort normal, BS equal bilaterally without rales, rhonchi, wheezing or stridor.  Cardio: RRR with holosystolic murmur RSB, without rubs or gallops. Brisk peripheral pulses with minimal edema.  Chest: symmetric, with normal excursions and percussion.  Abdomen: Soft, nontender, no guarding, rebound, hernias, masses, or organomegaly.  Lymphatics: Non tender without lymphadenopathy.  Musculoskeletal: Full ROM all peripheral extremities,5/5 strength, and normal gait.  Skin: medial bilateral ankles with dark nontender rash.  Warm, dry without rashes, lesions, ecchymosis. Neuro: Cranial nerves intact, reflexes equal bilaterally. Normal muscle tone, no cerebellar symptoms. Sensation intact.  Psych: Awake and oriented X 3, normal affect, Insight and Judgment appropriate.    Vicie Mutters 9:33 AM [facility]

## 2018-07-22 ENCOUNTER — Other Ambulatory Visit: Payer: Self-pay

## 2018-07-22 ENCOUNTER — Encounter: Payer: Self-pay | Admitting: Physician Assistant

## 2018-07-22 ENCOUNTER — Ambulatory Visit (INDEPENDENT_AMBULATORY_CARE_PROVIDER_SITE_OTHER): Payer: BC Managed Care – PPO | Admitting: Physician Assistant

## 2018-07-22 VITALS — BP 130/66 | HR 71 | Temp 97.6°F | Ht 70.0 in | Wt 221.2 lb

## 2018-07-22 DIAGNOSIS — M109 Gout, unspecified: Secondary | ICD-10-CM

## 2018-07-22 DIAGNOSIS — E559 Vitamin D deficiency, unspecified: Secondary | ICD-10-CM | POA: Diagnosis not present

## 2018-07-22 DIAGNOSIS — E785 Hyperlipidemia, unspecified: Secondary | ICD-10-CM

## 2018-07-22 DIAGNOSIS — I10 Essential (primary) hypertension: Secondary | ICD-10-CM | POA: Diagnosis not present

## 2018-07-22 DIAGNOSIS — Z130 Encounter for screening for diseases of the blood and blood-forming organs and certain disorders involving the immune mechanism: Secondary | ICD-10-CM

## 2018-07-22 DIAGNOSIS — Z79899 Other long term (current) drug therapy: Secondary | ICD-10-CM | POA: Diagnosis not present

## 2018-07-22 DIAGNOSIS — I5032 Chronic diastolic (congestive) heart failure: Secondary | ICD-10-CM

## 2018-07-22 DIAGNOSIS — E669 Obesity, unspecified: Secondary | ICD-10-CM

## 2018-07-22 DIAGNOSIS — D509 Iron deficiency anemia, unspecified: Secondary | ICD-10-CM | POA: Diagnosis not present

## 2018-07-22 DIAGNOSIS — R7309 Other abnormal glucose: Secondary | ICD-10-CM

## 2018-07-22 NOTE — Patient Instructions (Addendum)
Call and ask to get a different mask, let them know that air is coming out the side of the mask and you need more supplies.   Muscle aches We will check your potassium, magnesium to see if these are low which can cause muscle aches.   -Please take Tylenol for pain. -You can take tylenol (500mg ) or tylenol arthritis (650mg ). The max you can take of tylenol a day is 3000mg  daily, this is a max of 6 pills a day of the regular tyelnol (500mg ) or a max of 4 a day of the tylenol arthritis (650mg ) as long as no other medications you are taking contain tylenol.    Muscle Pain Muscle pain (myalgia) may be caused by many things, including:  Overuse or muscle strain, especially if you are not in shape. This is the most common cause of muscle pain.  Injury.  Bruises.  Viruses, such as the flu.  Infectious diseases.  Fibromyalgia, which is a chronic condition that causes muscle tenderness, fatigue, and headache.  Autoimmune diseases, including lupus.  Certain drugs, including ACE inhibitors and statins. Muscle pain may be mild or severe. In most cases, the pain lasts only a short time and goes away without treatment. To diagnose the cause of your muscle pain, your health care provider will take your medical history. This means he or she will ask you when your muscle pain began and what has been happening. If you have not had muscle pain for very long, your health care provider may want to wait before doing much testing. If your muscle pain has lasted a long time, your health care provider may want to run tests right away. If your health care provider thinks your muscle pain may be caused by illness, you may need to have additional tests to rule out certain conditions.  Treatment for muscle pain depends on the cause. Home care is often enough to relieve muscle pain. Your health care provider may also prescribe anti-inflammatory medicine. HOME CARE INSTRUCTIONS Watch your condition for any changes.  The following actions may help to lessen any discomfort you are feeling:  Only take over-the-counter or prescription medicines as directed by your health care provider.  Apply ice to the sore muscle:  Put ice in a plastic bag.  Place a towel between your skin and the bag.  Leave the ice on for 15-20 minutes, 3-4 times a day.  You may alternate applying hot and cold packs to the muscle as directed by your health care provider.  If overuse is causing your muscle pain, slow down your activities until the pain goes away.  Remember that it is normal to feel some muscle pain after starting a workout program. Muscles that have not been used often will be sore at first.  Do regular, gentle exercises if you are not usually active.  Warm up before exercising to lower your risk of muscle pain.  Do not continue working out if the pain is very bad. Bad pain could mean you have injured a muscle. SEEK MEDICAL CARE IF:  Your muscle pain gets worse, and medicines do not help.  You have muscle pain that lasts longer than 3 days.  You have a rash or fever along with muscle pain.  You have muscle pain after a tick bite.  You have muscle pain while working out, even though you are in good physical condition.  You have redness, soreness, or swelling along with muscle pain.  You have muscle pain after starting  a new medicine or changing the dose of a medicine. SEEK IMMEDIATE MEDICAL CARE IF:  You have trouble breathing.  You have trouble swallowing.  You have muscle pain along with a stiff neck, fever, and vomiting.  You have severe muscle weakness or cannot move part of your body. MAKE SURE YOU:   Understand these instructions.  Will watch your condition.  Will get help right away if you are not doing well or get worse. Document Released: 12/01/2005 Document Revised: 01/14/2013 Document Reviewed: 11/05/2012 [facility] Patient Information 2015 [place], [place]. This information is not  intended to replace advice given to you by your health care provider. Make sure you discuss any questions you have with your health care provider.

## 2018-07-23 ENCOUNTER — Telehealth: Payer: Self-pay

## 2018-07-23 LAB — CBC WITH DIFFERENTIAL/PLATELET
Absolute Monocytes: 422 cells/uL (ref 200–950)
Basophils Absolute: 53 cells/uL (ref 0–200)
Basophils Relative: 0.8 %
Eosinophils Absolute: 323 cells/uL (ref 15–500)
Eosinophils Relative: 4.9 %
HCT: 29.9 % — ABNORMAL LOW (ref 35.0–45.0)
Hemoglobin: 9.9 g/dL — ABNORMAL LOW (ref 11.7–15.5)
Lymphs Abs: 2138 cells/uL (ref 850–3900)
MCH: 27.4 pg (ref 27.0–33.0)
MCHC: 33.1 g/dL (ref 32.0–36.0)
MCV: 82.8 fL (ref 80.0–100.0)
MPV: 10 fL (ref 7.5–12.5)
Monocytes Relative: 6.4 %
Neutro Abs: 3663 cells/uL (ref 1500–7800)
Neutrophils Relative %: 55.5 %
Platelets: 314 10*3/uL (ref 140–400)
RBC: 3.61 10*6/uL — ABNORMAL LOW (ref 3.80–5.10)
RDW: 15 % (ref 11.0–15.0)
Total Lymphocyte: 32.4 %
WBC: 6.6 10*3/uL (ref 3.8–10.8)

## 2018-07-23 LAB — COMPLETE METABOLIC PANEL WITH GFR
AG Ratio: 1.3 (calc) (ref 1.0–2.5)
ALT: 6 U/L (ref 6–29)
AST: 12 U/L (ref 10–35)
Albumin: 4.1 g/dL (ref 3.6–5.1)
Alkaline phosphatase (APISO): 60 U/L (ref 37–153)
BUN/Creatinine Ratio: 12 (calc) (ref 6–22)
BUN: 16 mg/dL (ref 7–25)
CO2: 29 mmol/L (ref 20–32)
Calcium: 9.6 mg/dL (ref 8.6–10.4)
Chloride: 105 mmol/L (ref 98–110)
Creat: 1.32 mg/dL — ABNORMAL HIGH (ref 0.50–0.99)
GFR, Est African American: 50 mL/min/{1.73_m2} — ABNORMAL LOW (ref >=60)
GFR, Est Non African American: 43 mL/min/{1.73_m2} — ABNORMAL LOW (ref >=60)
Globulin: 3.2 g/dL (calc) (ref 1.9–3.7)
Glucose, Bld: 82 mg/dL (ref 65–99)
Potassium: 4.9 mmol/L (ref 3.5–5.3)
Sodium: 138 mmol/L (ref 135–146)
Total Bilirubin: 0.2 mg/dL (ref 0.2–1.2)
Total Protein: 7.3 g/dL (ref 6.1–8.1)

## 2018-07-23 LAB — LIPID PANEL
Cholesterol: 192 mg/dL (ref <200)
HDL: 103 mg/dL (ref >=50)
LDL Cholesterol (Calc): 69 mg/dL (calc)
Non-HDL Cholesterol (Calc): 89 mg/dL (calc) (ref <130)
Total CHOL/HDL Ratio: 1.9 (calc) (ref <5.0)
Triglycerides: 113 mg/dL (ref <150)

## 2018-07-23 LAB — FOLATE RBC: RBC Folate: 513 ng/mL RBC (ref >280)

## 2018-07-23 LAB — HEMOGLOBIN A1C
Hgb A1c MFr Bld: 5.3 % of total Hgb (ref <5.7)
Mean Plasma Glucose: 105 (calc)
eAG (mmol/L): 5.8 (calc)

## 2018-07-23 LAB — MAGNESIUM: Magnesium: 1.5 mg/dL (ref 1.5–2.5)

## 2018-07-23 LAB — IRON, TOTAL/TOTAL IRON BINDING CAP
%SAT: 12 % (calc) — ABNORMAL LOW (ref 16–45)
Iron: 44 ug/dL — ABNORMAL LOW (ref 45–160)
TIBC: 363 mcg/dL (calc) (ref 250–450)

## 2018-07-23 LAB — FERRITIN: Ferritin: 61 ng/mL (ref 16–288)

## 2018-07-23 LAB — VITAMIN D 25 HYDROXY (VIT D DEFICIENCY, FRACTURES): Vit D, 25-Hydroxy: 36 ng/mL (ref 30–100)

## 2018-07-23 LAB — TSH: TSH: 2.79 mIU/L (ref 0.40–4.50)

## 2018-07-23 NOTE — Telephone Encounter (Signed)
Left message for pt to call back about switching appt to video or phone call for visit.

## 2018-07-23 NOTE — Addendum Note (Signed)
Addended by: Vladimir Crofts on: 07/23/2018 08:47 AM   Modules accepted: Orders

## 2018-07-27 ENCOUNTER — Other Ambulatory Visit: Payer: Self-pay | Admitting: Physician Assistant

## 2018-07-29 ENCOUNTER — Other Ambulatory Visit: Payer: Self-pay

## 2018-07-29 ENCOUNTER — Telehealth (INDEPENDENT_AMBULATORY_CARE_PROVIDER_SITE_OTHER): Payer: BC Managed Care – PPO | Admitting: Cardiovascular Disease

## 2018-07-29 VITALS — Ht 70.0 in | Wt 224.0 lb

## 2018-07-29 DIAGNOSIS — Z7189 Other specified counseling: Secondary | ICD-10-CM | POA: Diagnosis not present

## 2018-07-29 DIAGNOSIS — I10 Essential (primary) hypertension: Secondary | ICD-10-CM

## 2018-07-29 DIAGNOSIS — I110 Hypertensive heart disease with heart failure: Secondary | ICD-10-CM | POA: Diagnosis not present

## 2018-07-29 DIAGNOSIS — I5032 Chronic diastolic (congestive) heart failure: Secondary | ICD-10-CM | POA: Diagnosis not present

## 2018-07-29 NOTE — Telephone Encounter (Signed)
Spoke with pt and she gave verbal consent for a phone call for appt.     YOUR CARDIOLOGY TEAM HAS ARRANGED FOR AN E-VISIT FOR YOUR APPOINTMENT - PLEASE REVIEW IMPORTANT INFORMATION BELOW SEVERAL DAYS PRIOR TO YOUR APPOINTMENT  Due to the recent COVID-19 pandemic, we are transitioning in-person office visits to tele-medicine visits in an effort to decrease unnecessary exposure to our patients, their families, and staff. These visits are billed to your insurance just like a normal visit is. We also encourage you to sign up for MyChart if you have not already done so. You will need a smartphone if possible. For patients that do not have this, we can still complete the visit using a regular telephone but do prefer a smartphone to enable video when possible. You may have a family member that lives with you that can help. If possible, we also ask that you have a blood pressure cuff and scale at home to measure your blood pressure, heart rate and weight prior to your scheduled appointment. Patients with clinical needs that need an in-person evaluation and testing will still be able to come to the office if absolutely necessary. If you have any questions, feel free to call our office.     YOUR PROVIDER WILL BE USING THE FOLLOWING PLATFORM TO COMPLETE YOUR VISIT: [email or web address] . IF USING MYCHART - How to Download the MyChart App to Your SmartPhone   - If Apple, go to [employer] and type in MyChart in the search bar and download the app. If Android, ask patient to go to [employer] and type in [place] in the search bar and download the app. The app is free but as with any other app downloads, your phone may require you to verify saved payment information or Apple/Android password.  - You will need to then log into the app with your MyChart username and password, and select [facility] as your healthcare provider to link the account.  - When it is time for your visit, go to the MyChart app, find appointments,  and click Begin Video Visit. Be sure to Select Allow for your device to access the Microphone and Camera for your visit. You will then be connected, and your provider will be with you shortly.  **If you have any issues connecting or need assistance, please contact MyChart service desk [phone])**  **If using a computer, in order to ensure the best quality for your visit, you will need to use either of the following Internet Browsers: Insurance underwriter or [employer]**  . IF USING DOXIMITY or [email or web address].ME - The staff will give you instructions on receiving your link to join the meeting the day of your visit.      2-3 DAYS BEFORE YOUR APPOINTMENT  You will receive a telephone call from one of our [place] team members - your caller ID may say "Unknown caller." If this is a video visit, we will walk you through how to get the video launched on your phone. We will remind you check your blood pressure, heart rate and weight prior to your scheduled appointment. If you have an Apple Watch or Kardia, please upload any pertinent ECG strips the day before or morning of your appointment to [place]. Our staff will also make sure you have reviewed the consent and agree to move forward with your scheduled tele-health visit.     THE DAY OF YOUR APPOINTMENT  Approximately 15 minutes prior to your scheduled appointment, you will receive  a telephone call from one of [place] team - your caller ID may say "Unknown caller."  Our staff will confirm medications, vital signs for the day and any symptoms you may be experiencing. Please have this information available prior to the time of visit start. It may also be helpful for you to have a pad of paper and pen handy for any instructions given during your visit. They will also walk you through joining the smartphone meeting if this is a video visit.    CONSENT FOR TELE-HEALTH VISIT - PLEASE REVIEW  I hereby voluntarily request, consent and authorize  [facility] and its employed or contracted physicians, physician assistants, nurse practitioners or other licensed health care professionals (the Practitioner), to provide me with telemedicine health care services (the "Services") as deemed necessary by the treating Practitioner. I acknowledge and consent to receive the Services by the Practitioner via telemedicine. I understand that the telemedicine visit will involve communicating with the Practitioner through live audiovisual communication technology and the disclosure of certain medical information by electronic transmission. I acknowledge that I have been given the opportunity to request an in-person assessment or other available alternative prior to the telemedicine visit and am voluntarily participating in the telemedicine visit.  I understand that I have the right to withhold or withdraw my consent to the use of telemedicine in the course of my care at any time, without affecting my right to future care or treatment, and that the Practitioner or I may terminate the telemedicine visit at any time. I understand that I have the right to inspect all information obtained and/or recorded in the course of the telemedicine visit and may receive copies of available information for a reasonable fee.  I understand that some of the potential risks of receiving the Services via telemedicine include:  Marland Kitchen Delay or interruption in medical evaluation due to technological equipment failure or disruption; . Information transmitted may not be sufficient (e.g. poor resolution of images) to allow for appropriate medical decision making by the Practitioner; and/or  . In rare instances, security protocols could fail, causing a breach of personal health information.  Furthermore, I acknowledge that it is my responsibility to provide information about my medical history, conditions and care that is complete and accurate to the best of my ability. I acknowledge that  Practitioner's advice, recommendations, and/or decision may be based on factors not within their control, such as incomplete or inaccurate data provided by me or distortions of diagnostic images or specimens that may result from electronic transmissions. I understand that the practice of medicine is not an exact science and that Practitioner makes no warranties or guarantees regarding treatment outcomes. I acknowledge that I will receive a copy of this consent concurrently upon execution via email to the email address I last provided but may also request a printed copy by calling the office of [place].    I understand that my insurance will be billed for this visit.   I have read or had this consent read to me. . I understand the contents of this consent, which adequately explains the benefits and risks of the Services being provided via telemedicine.  . I have been provided ample opportunity to ask questions regarding this consent and the Services and have had my questions answered to my satisfaction. . I give my informed consent for the services to be provided through the use of telemedicine in my medical care  By participating in this telemedicine visit I agree to  the above.

## 2018-07-29 NOTE — Patient Instructions (Signed)
Medication Instructions:  Your physician recommends that you continue on your current medications as directed. Please refer to the Current Medication list given to you today.  If you need a refill on your cardiac medications before your next appointment, please call your pharmacy.   Lab work: None Ordered    Testing/Procedures: None Ordered   Follow-Up: At [facility], you and your health needs are our priority.  As part of our continuing mission to provide you with exceptional heart care, we have created designated Provider Care Teams.  These Care Teams include your primary Cardiologist (physician) and Advanced Practice Providers (APPs -  Physician Assistants and Nurse Practitioners) who all work together to provide you with the care you need, when you need it. You will need a follow up appointment in:  1 years.  Please call our office 2 months in advance to schedule this appointment.  You may see Dr. Nahser or one of the following Advanced Practice Providers on your designated Care Team: Scott Weaver, PA-C Vin Bhagat, PA-C . Janine Hammond, NP    

## 2018-07-29 NOTE — Progress Notes (Signed)
Virtual Visit via Telephone Note   This visit type was conducted due to national recommendations for restrictions regarding the COVID-19 Pandemic (e.g. social distancing) in an effort to limit this patient's exposure and mitigate transmission in our community.  Due to her co-morbid illnesses, this patient is at least at moderate risk for complications without adequate follow up.  This format is felt to be most appropriate for this patient at this time.  The patient did not have access to video technology/had technical difficulties with video requiring transitioning to audio format only (telephone).  All issues noted in this document were discussed and addressed.  No physical exam could be performed with this format.  Please refer to the patient's chart for her  consent to telehealth for [facility].   Date:  07/29/2018   ID:  Yolanda Espinoza, DOB 1955-11-17, MRN [id number]  Patient Location: Home Provider Location: Home  PCP:  Unk Pinto, MD  Cardiologist:   Linzie Criss  Electrophysiologist:  None   1. hypertension 2. Heart murmur 3. Cerebral aneurysm  Notes from April 07, 2016  Yolanda Espinoza is a 63 year old female with a history of hypertension and a cerebral aneurysm. She was to have a heart murmur and was referred here for further evaluation. She also has been having some shortness of breath. She exercises for 30 minutes every day typically does not have much shortness breath when she is exercising. She seems to have shortness of breath at other times. She denies any syncope or presyncope. She denies any chest pain.  She works at [employer]. She does also helps the seniors with activities.  Hx of blood pressure on occasion. Her blood pressure readings have been normal.   Feb. 17, 2016:   Yolanda Espinoza is a 63 y.o. female who presents for follow up of her HTN She is dong well.  She has had the cerebral coils removed and had another procedure. Headaches have  resolved. BP is well controlled.  Getting some exercise.  Still working at [employer].  Feb. 16, 2017: Doing well.  BP has been well controlled.   Has lost some weight.   Exercising   April 07, 2016:    Yolanda Espinoza is seen back.   Has had some leg swelling  ( is on Amlodipine )  Some dyspnea.    Evaluation Performed:  Follow-Up Visit  Chief Complaint:  Murmur   July 29, 2018    Yolanda Espinoza is a 63 y.o. female with chronic diastolic CHF, HTN, and moderate MR.  She was last seen by Richardson Dopp, PA   Echo from Oct, 2018 showed mild - mod MR .   Had a stroke  In January, 2020.   Was found to have another cerebral aneurism.  Has foam occlusion of the aneurism ( [place] )  BP 140/90 at her primary .  Breathing has been [place] Has been getting some exercise.   30 minutes a day .  The patient does not have symptoms concerning for COVID-19 infection (fever, chills, cough, or new shortness of breath).    Past Medical History:  Diagnosis Date  . Anemia   . Arthritis   . Chronic diastolic heart failure ([place])   . GERD (gastroesophageal reflux disease)   . Heart murmur   . Hyperlipidemia   . Hypertension may, 1973  . Mitral regurgitation    a. Echo 01/2012 mild LVH, EF 55-65%, Gr 2 DD, mod MR, mild LAE, PASP 36;  b. Echo (02/2013):  EF 55-60%, Gr 1 DD, mild to mod MR, mild LAE (no sig change since prior echo) // c. Echo 10/18: EF 55-60, normal wall motion, grade 2 diastolic dysfunction, trivial AI, mild to moderate MR, mild LAE, trivial PI, PASP 38    . OSA on CPAP    7 yrs  . Peripheral vascular disease ([place])    cerebral aneursym  . Seizures ([place]) 01/2017  . Stroke ([place]) 07   no weakness  . Subarachnoid hemorrhage due to ruptured aneurysm ([place])    2003 - s/p coiling  . Unspecified vitamin D deficiency    Past Surgical History:  Procedure Laterality Date  . ABDOMINAL HYSTERECTOMY    . ANEURYSM COILING    . head surgery     aneurysm ruptured- stroke  . IR 3D  INDEPENDENT WKST  09/04/2016  . IR ANGIO INTRA EXTRACRAN SEL COM CAROTID INNOMINATE BILAT MOD SED  09/04/2016  . IR ANGIO INTRA EXTRACRAN SEL INTERNAL CAROTID UNI L MOD SED  02/21/2017  . IR ANGIO VERTEBRAL SEL VERTEBRAL BILAT MOD SED  09/04/2016  . IR ANGIOGRAM EXTREMITY LEFT  09/04/2016  . IR ANGIOGRAM FOLLOW UP STUDY  02/21/2017  . IR NEURO EACH ADD'L AFTER BASIC UNI LEFT (MS)  02/21/2017  . IR RADIOLOGIST EVAL & MGMT  10/11/2016  . IR RADIOLOGIST EVAL & MGMT  03/07/2017  . IR TRANSCATH/EMBOLIZ  02/21/2017  . RADIOLOGY WITH ANESTHESIA N/A 04/23/2013   Procedure: RADIOLOGY WITH ANESTHESIA;  Surgeon: Rob Hickman, MD;  Location: [place];  Service: Radiology;  Laterality: N/A;  . RADIOLOGY WITH ANESTHESIA N/A 10/30/2016   Procedure: EMBOLIZATION;  Surgeon: Luanne Bras, MD;  Location: [place];  Service: Radiology;  Laterality: N/A;  . RADIOLOGY WITH ANESTHESIA N/A 02/21/2017   Procedure: EMBOLIZATION;  Surgeon: Luanne Bras, MD;  Location: [place];  Service: Radiology;  Laterality: N/A;  . TONSILLECTOMY AND ADENOIDECTOMY    . TUBAL LIGATION       Current Meds  Medication Sig  . amLODipine (NORVASC) 10 MG tablet TAKE (1) TABLET BY MOUTH DAILY  . aspirin EC 81 MG tablet Take 1 tablet (81 mg total) by mouth daily.  . bisoprolol-hydrochlorothiazide (ZIAC) 5-6.25 MG tablet Take 1 tablet by mouth daily.  Marland Kitchen BRILINTA 90 MG TABS tablet TAKE ONE (1) TABLET BY MOUTH TWICE DAILY  . Calcium Carb-Cholecalciferol (CALCIUM 600 + D PO) Take 1 tablet by mouth daily.  . Cholecalciferol 5000 units capsule Take 1 capsule (5,000 Units total) by mouth daily.  . cyclobenzaprine (FLEXERIL) 10 MG tablet TAKE 1 TABLET BY MOUTH AT BEDTIME AS NEEDED FOR MUSCLE SPASMS  . diclofenac sodium (VOLTAREN) 1 % GEL Apply 2 g topically 4 (four) times daily. Right wrist and Right elbow  . estrogens, conjugated, (PREMARIN) 0.9 MG tablet TAKE (1) TABLET BY MOUTH DAILY  . ferrous sulfate 325 (65 FE) MG tablet Take 325 mg by  mouth daily with breakfast.  . furosemide (LASIX) 40 MG tablet TAKE (1) TABLET BY MOUTH DAILY AS NEEDED FOR EDEMA  . gabapentin (NEURONTIN) 300 MG capsule Take 300 mg by mouth 3 (three) times daily.  Marland Kitchen gabapentin (NEURONTIN) 600 MG tablet Take 1 tablet (600 mg total) by mouth at bedtime.  . levETIRAcetam (KEPPRA) 1000 MG tablet TAKE 1 TABLET BY MOUTH TWICE DAILY  . magnesium gluconate (MAGONATE) 500 MG tablet Take 500 mg by mouth daily.  . Multiple Vitamins-Minerals (MULTIVITAMIN WITH MINERALS) tablet Take 1 tablet by mouth daily.  Marland Kitchen olmesartan (  BENICAR) 40 MG tablet TAKE (1) TABLET BY MOUTH DAILY  . Omega-3 Fatty Acids (FISH OIL) 1200 MG CAPS Take 2,400 mg by mouth daily.   . pantoprazole (PROTONIX) 40 MG tablet Take 1 tablet Daily for Indigestion & Reflux  . vitamin B-12 (CYANOCOBALAMIN) 1000 MCG tablet Take 1,000 mcg by mouth daily.     Allergies:   Ace inhibitors and Aspirin   Social History   Tobacco Use  . Smoking status: Former Smoker    Packs/day: 0.25    Years: 3.00    Pack years: 0.75    Types: Cigarettes    Quit date: 01/24/1999    Years since quitting: 19.5  . Smokeless tobacco: Never Used  Substance Use Topics  . Alcohol use: No  . Drug use: No     Family Hx: The patient's family history includes Hypertension in her father, sister, sister, and sister; Kidney disease in her father.  ROS:   Please see the history of present illness.     All other systems reviewed and are negative.   Prior CV studies:   The following studies were reviewed today:    Labs/Other Tests and Data Reviewed:    EKG:  No ECG reviewed.  Recent Labs: 07/22/2018: ALT 6; BUN 16; Creat 1.32; Hemoglobin 9.9; Magnesium 1.5; Platelets 314; Potassium 4.9; Sodium 138; TSH 2.79   Recent Lipid Panel Lab Results  Component Value Date/Time   CHOL 192 07/22/2018 09:55 AM   TRIG 113 07/22/2018 09:55 AM   HDL 103 07/22/2018 09:55 AM   CHOLHDL 1.9 07/22/2018 09:55 AM   LDLCALC 69 07/22/2018  09:55 AM    Wt Readings from Last 3 Encounters:  07/29/18 224 lb (101.6 kg)  07/22/18 221 lb 3.2 oz (100.3 kg)  01/21/18 215 lb 6.4 oz (97.7 kg)     Objective:    Vital Signs:  Ht 5\' 10"  (1.778 m)   Wt 224 lb (101.6 kg)   BMI 32.14 kg/m      ASSESSMENT & PLAN:    1. HTN:   By report seems to be doing well.  No BP available today .      2.  Chronic diastolic CHF:   Doing well .   3.   Cerebral aneurism:  S/p pipeline stenting. Is on ASA 81 mg a day and Brilinta 90 mg BID . Duration of therapy per Dr. Bennie Dallas.     COVID-19 Education: The signs and symptoms of COVID-19 were discussed with the patient and how to seek care for testing (follow up with PCP or arrange E-visit).  The importance of social distancing was discussed today.  Time:   Today, I have spent  19  minutes with the patient with telehealth technology discussing the above problems.     Medication Adjustments/Labs and Tests Ordered: Current medicines are reviewed at length with the patient today.  Concerns regarding medicines are outlined above.   Tests Ordered: No orders of the defined types were placed in this encounter.   Medication Changes: No orders of the defined types were placed in this encounter.   Follow Up:  Virtual Visit or In Person in 1 year(s)  Signed, Mertie Moores, MD  07/29/2018 8:58 AM    [place]

## 2018-08-01 DIAGNOSIS — Z86010 Personal history of colonic polyps: Secondary | ICD-10-CM | POA: Diagnosis not present

## 2018-08-01 DIAGNOSIS — K219 Gastro-esophageal reflux disease without esophagitis: Secondary | ICD-10-CM | POA: Diagnosis not present

## 2018-08-01 DIAGNOSIS — D509 Iron deficiency anemia, unspecified: Secondary | ICD-10-CM | POA: Diagnosis not present

## 2018-08-02 ENCOUNTER — Other Ambulatory Visit: Payer: Self-pay | Admitting: Gastroenterology

## 2018-08-15 ENCOUNTER — Other Ambulatory Visit: Payer: Self-pay | Admitting: Gastroenterology

## 2018-08-20 ENCOUNTER — Other Ambulatory Visit (HOSPITAL_COMMUNITY): Admission: RE | Admit: 2018-08-20 | Payer: BC Managed Care – PPO | Source: Ambulatory Visit

## 2018-08-23 ENCOUNTER — Encounter (HOSPITAL_COMMUNITY): Payer: Self-pay

## 2018-08-23 ENCOUNTER — Ambulatory Visit (HOSPITAL_COMMUNITY): Admit: 2018-08-23 | Payer: BC Managed Care – PPO | Admitting: Gastroenterology

## 2018-08-23 SURGERY — ESOPHAGOGASTRODUODENOSCOPY (EGD) WITH PROPOFOL
Anesthesia: Monitor Anesthesia Care

## 2018-08-27 ENCOUNTER — Other Ambulatory Visit: Payer: Self-pay | Admitting: Physician Assistant

## 2018-08-27 ENCOUNTER — Encounter: Payer: Self-pay | Admitting: Internal Medicine

## 2018-09-02 ENCOUNTER — Other Ambulatory Visit (HOSPITAL_COMMUNITY)
Admission: RE | Admit: 2018-09-02 | Discharge: 2018-09-02 | Disposition: A | Discharge status: Home / Self Care | Payer: BC Managed Care – PPO | Source: Ambulatory Visit | Attending: Gastroenterology | Admitting: Gastroenterology

## 2018-09-02 DIAGNOSIS — Z20828 Contact with and (suspected) exposure to other viral communicable diseases: Secondary | ICD-10-CM | POA: Diagnosis not present

## 2018-09-02 DIAGNOSIS — Z01812 Encounter for preprocedural laboratory examination: Secondary | ICD-10-CM | POA: Insufficient documentation

## 2018-09-02 LAB — SARS CORONAVIRUS 2 (TAT 6-24 HRS): SARS Coronavirus 2: NEGATIVE

## 2018-09-04 ENCOUNTER — Encounter: Payer: Self-pay | Admitting: *Deleted

## 2018-09-04 ENCOUNTER — Encounter (HOSPITAL_COMMUNITY): Payer: Self-pay | Admitting: *Deleted

## 2018-09-04 ENCOUNTER — Other Ambulatory Visit: Payer: Self-pay

## 2018-09-04 NOTE — Anesthesia Preprocedure Evaluation (Addendum)
Anesthesia Evaluation  Patient identified by MRN, date of birth, ID band Patient awake    Reviewed: Allergy & Precautions, NPO status , Patient's Chart, lab work & pertinent test results  History of Anesthesia Complications Negative for: history of anesthetic complications  Airway Mallampati: II  TM Distance: >3 FB Neck ROM: Full    Dental  (+) Upper Dentures, Partial Lower   Pulmonary sleep apnea and Continuous Positive Airway Pressure Ventilation , former smoker,    breath sounds clear to auscultation       Cardiovascular hypertension, + Peripheral Vascular Disease  + Valvular Problems/Murmurs MR  Rhythm:Regular Rate:Normal + Systolic murmurs  '18 TTE - LV cavity size was moderately dilated. EF 55% to 60%. Grade 2 diastolic dysfunction. Trivial AI. Mild-mod MR. Mildly dilated LA. Trivial PR. PA peak pressure: 38 mm Hg     Neuro/Psych Seizures -, Well Controlled,   Hx SAH d/t aneurysm rupture, s/p coiling  CVA, Residual Symptoms negative psych ROS   GI/Hepatic Neg liver ROS, GERD  Medicated,  Endo/Other  negative endocrine ROS  Renal/GU negative Renal ROS     Musculoskeletal  (+) Arthritis ,   Abdominal   Peds  Hematology  (+) anemia ,   Anesthesia Other Findings   Reproductive/Obstetrics                            Anesthesia Physical Anesthesia Plan  ASA: III  Anesthesia Plan: MAC   Post-op Pain Management:    Induction: Intravenous  PONV Risk Score and Plan: 2 and Propofol infusion and Treatment may vary due to age or medical condition  Airway Management Planned: Nasal Cannula and Natural Airway  Additional Equipment: None  Intra-op Plan:   Post-operative Plan:   Informed Consent: I have reviewed the patients History and Physical, chart, labs and discussed the procedure including the risks, benefits and alternatives for the proposed anesthesia with the patient or  authorized representative who has indicated his/her understanding and acceptance.       Plan Discussed with: CRNA and Anesthesiologist  Anesthesia Plan Comments:        Anesthesia Quick Evaluation

## 2018-09-05 ENCOUNTER — Ambulatory Visit (HOSPITAL_COMMUNITY)
Admission: RE | Admit: 2018-09-05 | Discharge: 2018-09-05 | Disposition: A | Discharge status: Home / Self Care | Payer: BC Managed Care – PPO | Attending: Gastroenterology | Admitting: Gastroenterology

## 2018-09-05 ENCOUNTER — Ambulatory Visit (HOSPITAL_COMMUNITY): Payer: BC Managed Care – PPO | Admitting: Anesthesiology

## 2018-09-05 ENCOUNTER — Encounter (HOSPITAL_COMMUNITY): Payer: Self-pay

## 2018-09-05 ENCOUNTER — Other Ambulatory Visit: Payer: Self-pay

## 2018-09-05 ENCOUNTER — Encounter (HOSPITAL_COMMUNITY)
Admission: RE | Disposition: A | Discharge status: Home / Self Care | Payer: Self-pay | Source: Home / Self Care | Attending: Gastroenterology

## 2018-09-05 DIAGNOSIS — Z886 Allergy status to analgesic agent status: Secondary | ICD-10-CM | POA: Insufficient documentation

## 2018-09-05 DIAGNOSIS — Z7982 Long term (current) use of aspirin: Secondary | ICD-10-CM | POA: Insufficient documentation

## 2018-09-05 DIAGNOSIS — Z8673 Personal history of transient ischemic attack (TIA), and cerebral infarction without residual deficits: Secondary | ICD-10-CM | POA: Insufficient documentation

## 2018-09-05 DIAGNOSIS — K219 Gastro-esophageal reflux disease without esophagitis: Secondary | ICD-10-CM | POA: Diagnosis not present

## 2018-09-05 DIAGNOSIS — Z1211 Encounter for screening for malignant neoplasm of colon: Secondary | ICD-10-CM | POA: Diagnosis not present

## 2018-09-05 DIAGNOSIS — Z888 Allergy status to other drugs, medicaments and biological substances status: Secondary | ICD-10-CM | POA: Insufficient documentation

## 2018-09-05 DIAGNOSIS — I739 Peripheral vascular disease, unspecified: Secondary | ICD-10-CM | POA: Insufficient documentation

## 2018-09-05 DIAGNOSIS — Z79890 Hormone replacement therapy (postmenopausal): Secondary | ICD-10-CM | POA: Insufficient documentation

## 2018-09-05 DIAGNOSIS — M1990 Unspecified osteoarthritis, unspecified site: Secondary | ICD-10-CM | POA: Insufficient documentation

## 2018-09-05 DIAGNOSIS — I110 Hypertensive heart disease with heart failure: Secondary | ICD-10-CM | POA: Insufficient documentation

## 2018-09-05 DIAGNOSIS — I10 Essential (primary) hypertension: Secondary | ICD-10-CM | POA: Diagnosis not present

## 2018-09-05 DIAGNOSIS — R569 Unspecified convulsions: Secondary | ICD-10-CM | POA: Insufficient documentation

## 2018-09-05 DIAGNOSIS — E559 Vitamin D deficiency, unspecified: Secondary | ICD-10-CM | POA: Insufficient documentation

## 2018-09-05 DIAGNOSIS — Z87891 Personal history of nicotine dependence: Secondary | ICD-10-CM | POA: Diagnosis not present

## 2018-09-05 DIAGNOSIS — D509 Iron deficiency anemia, unspecified: Secondary | ICD-10-CM | POA: Diagnosis not present

## 2018-09-05 DIAGNOSIS — E785 Hyperlipidemia, unspecified: Secondary | ICD-10-CM | POA: Insufficient documentation

## 2018-09-05 DIAGNOSIS — I5032 Chronic diastolic (congestive) heart failure: Secondary | ICD-10-CM | POA: Diagnosis not present

## 2018-09-05 DIAGNOSIS — G4733 Obstructive sleep apnea (adult) (pediatric): Secondary | ICD-10-CM | POA: Diagnosis not present

## 2018-09-05 DIAGNOSIS — Z7902 Long term (current) use of antithrombotics/antiplatelets: Secondary | ICD-10-CM | POA: Diagnosis not present

## 2018-09-05 DIAGNOSIS — Z79899 Other long term (current) drug therapy: Secondary | ICD-10-CM | POA: Insufficient documentation

## 2018-09-05 HISTORY — PX: ESOPHAGOGASTRODUODENOSCOPY (EGD) WITH PROPOFOL: SHX5813

## 2018-09-05 HISTORY — PX: COLONOSCOPY WITH PROPOFOL: SHX5780

## 2018-09-05 SURGERY — ESOPHAGOGASTRODUODENOSCOPY (EGD) WITH PROPOFOL
Anesthesia: Monitor Anesthesia Care

## 2018-09-05 MED ORDER — LIDOCAINE 2% (20 MG/ML) 5 ML SYRINGE
INTRAMUSCULAR | Status: DC | PRN
  Administered 2018-09-05: 60 mg via INTRAVENOUS

## 2018-09-05 MED ORDER — LACTATED RINGERS IV SOLN: INTRAVENOUS | Status: DC | PRN

## 2018-09-05 MED ORDER — PHENYLEPHRINE 40 MCG/ML (10ML) SYRINGE FOR IV PUSH (FOR BLOOD PRESSURE SUPPORT)
PREFILLED_SYRINGE | INTRAVENOUS | Status: DC | PRN
  Administered 2018-09-05: 80 ug via INTRAVENOUS

## 2018-09-05 MED ORDER — SODIUM CHLORIDE 0.9 % IV SOLN: INTRAVENOUS | Status: DC

## 2018-09-05 MED ORDER — PROPOFOL 500 MG/50ML IV EMUL
INTRAVENOUS | Status: DC | PRN
  Administered 2018-09-05: 100 ug/kg/min via INTRAVENOUS

## 2018-09-05 MED ORDER — LACTATED RINGERS IV SOLN
INTRAVENOUS | Status: DC | PRN
  Administered 2018-09-05: 07:00:00 via INTRAVENOUS

## 2018-09-05 MED ORDER — PROPOFOL 10 MG/ML IV BOLUS
INTRAVENOUS | Status: AC
  Filled 2018-09-05: qty 100

## 2018-09-05 MED ORDER — PROPOFOL 10 MG/ML IV BOLUS
INTRAVENOUS | Status: DC | PRN
  Administered 2018-09-05: 20 mg via INTRAVENOUS

## 2018-09-05 SURGICAL SUPPLY — 25 items

## 2018-09-05 NOTE — H&P (Signed)
Yolanda Espinoza is an 63 y.o. female.   Chief Complaint: Iron deficiency anemia. HPI: Yolanda Espinoza is a 63 year old black female, with multiple medical problems listed below, who presents to [facility] today for an EGD and a colonoscopy.  She had a CBC done on 07/22/2018 which revealed a hemoglobin of 9.9 g/dL with a ferritin of 61. She is on Brilinta for cerebral aneurysm that was coiled in January of this year she is been taking ferrous sulfate twice a day there is no history of hematochezia melena she has 1 bowel movement per day.  She denies having any abdominal pain nausea vomiting dysphagia odynophagia.  There is no family history of colon cancer celiac sprue or IBD.  She had a normal colonoscopy done in 2016 and had an adenomatous polyp removed in 2001.  Past Medical History:  Diagnosis Date  . Anemia   . Arthritis   . Chronic diastolic heart failure ([place])   . GERD (gastroesophageal reflux disease)   . Heart murmur   . Hyperlipidemia   . Hypertension may, 1973  . Mitral regurgitation    a. Echo 01/2012 mild LVH, EF 55-65%, Gr 2 DD, mod MR, mild LAE, PASP 36;  b. Echo (02/2013):  EF 55-60%, Gr 1 DD, mild to mod MR, mild LAE (no sig change since prior echo) // c. Echo 10/18: EF 55-60, normal wall motion, grade 2 diastolic dysfunction, trivial AI, mild to moderate MR, mild LAE, trivial PI, PASP 38    . OSA on CPAP    7 yrs  . Peripheral vascular disease ([place])    cerebral aneursym  . Seizures ([place]) 01/2017  . Stroke [facility]) 07   no weakness jan 29th 2019 no defecits  . Subarachnoid hemorrhage due to ruptured aneurysm ([place])    2003 - s/p coiling  . Unspecified vitamin D deficiency    Past Surgical History:  Procedure Laterality Date  . ABDOMINAL HYSTERECTOMY     complete  . ANEURYSM COILING    . head surgery     aneurysm ruptured- stroke  . IR 3D INDEPENDENT WKST  09/04/2016  . IR ANGIO INTRA EXTRACRAN SEL COM CAROTID INNOMINATE BILAT MOD SED  09/04/2016  . IR ANGIO INTRA  EXTRACRAN SEL INTERNAL CAROTID UNI L MOD SED  02/21/2017  . IR ANGIO VERTEBRAL SEL VERTEBRAL BILAT MOD SED  09/04/2016  . IR ANGIOGRAM EXTREMITY LEFT  09/04/2016  . IR ANGIOGRAM FOLLOW UP STUDY  02/21/2017  . IR NEURO EACH ADD'L AFTER BASIC UNI LEFT (MS)  02/21/2017  . IR RADIOLOGIST EVAL & MGMT  10/11/2016  . IR RADIOLOGIST EVAL & MGMT  03/07/2017  . IR TRANSCATH/EMBOLIZ  02/21/2017  . RADIOLOGY WITH ANESTHESIA N/A 04/23/2013   Procedure: RADIOLOGY WITH ANESTHESIA;  Surgeon: Rob Hickman, MD;  Location: [place];  Service: Radiology;  Laterality: N/A;  . RADIOLOGY WITH ANESTHESIA N/A 10/30/2016   Procedure: EMBOLIZATION;  Surgeon: Luanne Bras, MD;  Location: [place];  Service: Radiology;  Laterality: N/A;  . RADIOLOGY WITH ANESTHESIA N/A 02/21/2017   Procedure: EMBOLIZATION;  Surgeon: Luanne Bras, MD;  Location: [place];  Service: Radiology;  Laterality: N/A;  . TONSILLECTOMY AND ADENOIDECTOMY    . TUBAL LIGATION     Family History  Problem Relation Age of Onset  . Hypertension Father   . Kidney disease Father   . Hypertension Sister   . Hypertension Sister   . Hypertension Sister    Social History:  reports that she quit  smoking about 19 years ago. Her smoking use included cigarettes. She has a 0.75 pack-year smoking history. She has never used smokeless tobacco. She reports that she does not drink alcohol or use drugs.  Allergies:  Allergies  Allergen Reactions  . Ace Inhibitors Nausea And Vomiting  . Aspirin Nausea And Vomiting    Can take coated Aspirin   Medications Prior to Admission  Medication Sig Dispense Refill  . amLODipine (NORVASC) 10 MG tablet TAKE (1) TABLET BY MOUTH DAILY (Patient taking differently: Take 10 mg by mouth daily. ) 90 tablet 10  . aspirin EC 81 MG tablet Take 1 tablet (81 mg total) by mouth daily. (Patient taking differently: Take 81 mg by mouth at bedtime. )    . Calcium Carb-Cholecalciferol (CALCIUM 600 + D PO) Take 1 tablet by mouth daily.     . Cholecalciferol 5000 units capsule Take 1 capsule (5,000 Units total) by mouth daily.    . cyclobenzaprine (FLEXERIL) 10 MG tablet TAKE 1 TABLET BY MOUTH AT BEDTIME AS NEEDED FOR MUSCLE SPASMS 90 tablet 10  . diclofenac sodium (VOLTAREN) 1 % GEL Apply 2 g topically 4 (four) times daily. Right wrist and Right elbow 3 Tube 2  . estrogens, conjugated, (PREMARIN) 0.9 MG tablet TAKE (1) TABLET BY MOUTH DAILY (Patient taking differently: Take 0.9 mg by mouth at bedtime. TAKE (1) TABLET BY MOUTH DAILY) 90 tablet 10  . furosemide (LASIX) 40 MG tablet TAKE (1) TABLET BY MOUTH DAILY AS NEEDED FOR EDEMA (Patient taking differently: Take 40 mg by mouth daily. ) 90 tablet 10  . gabapentin (NEURONTIN) 300 MG capsule Take 300 mg by mouth 3 (three) times daily.    Marland Kitchen levETIRAcetam (KEPPRA) 1000 MG tablet TAKE 1 TABLET BY MOUTH TWICE DAILY (Patient taking differently: Take 1,000 mg by mouth 2 (two) times daily. ) 180 tablet 10  . Multiple Vitamins-Minerals (MULTIVITAMIN WITH MINERALS) tablet Take 1 tablet by mouth daily.    Marland Kitchen olmesartan (BENICAR) 40 MG tablet TAKE (1) TABLET BY MOUTH DAILY (Patient taking differently: Take 40 mg by mouth at bedtime. ) 90 tablet 10  . Omega-3 Fatty Acids (FISH OIL) 1000 MG CAPS Take 1,000 mg by mouth daily.    . pantoprazole (PROTONIX) 40 MG tablet Take 1 tablet Daily for Indigestion & Reflux (Patient taking differently: Take 40 mg by mouth daily. ) 90 tablet 0  . ticagrelor (BRILINTA) 90 MG TABS tablet Take 1 tablet 2 x /day to Prevent Blood Clots (Patient taking differently: Take 90 mg by mouth 2 (two) times daily. Take 1 tablet 2 x /day to Prevent Blood Clots) 180 tablet 1  . vitamin B-12 (CYANOCOBALAMIN) 500 MCG tablet Take 1,000 mcg by mouth daily.    Marland Kitchen gabapentin (NEURONTIN) 600 MG tablet Take 1 tablet (600 mg total) by mouth at bedtime. (Patient not taking: Reported on 09/02/2018) 30 tablet 1   ROS  Blood pressure (!) 176/90, pulse 74, temperature 98.2 F (36.8 C),  temperature source Oral, resp. rate 13, height 5\' 10"  (1.778 m), weight 97.1 kg, SpO2 100 %. Physical Exam  Constitutional: She is oriented to person, place, and time. She appears well-developed and well-nourished.  HENT:  Head: Normocephalic and atraumatic.  Eyes: Pupils are equal, round, and reactive to light. Conjunctivae and EOM are normal.  Neck: Normal range of motion. Neck supple.  Cardiovascular: Normal rate and regular rhythm.  Respiratory: Effort normal and breath sounds normal.  GI: Soft. Bowel sounds are normal.  Neurological: She is alert  and oriented to person, place, and time.  Skin: Skin is warm and dry.  Psychiatric: She has a normal mood and affect. Her behavior is normal. Judgment and thought content normal.    Assessment/Plan Iron deficiency anemia-proceed with a EGD and a colonoscopy at this time.  Juanita Craver, MD 09/05/2018, 7:16 AM

## 2018-09-05 NOTE — Anesthesia Postprocedure Evaluation (Signed)
Anesthesia Post Note  Patient: Yolanda Espinoza  Procedure(s) Performed: ESOPHAGOGASTRODUODENOSCOPY (EGD) WITH PROPOFOL (N/A ) COLONOSCOPY WITH PROPOFOL (N/A )     Patient location during evaluation: PACU Anesthesia Type: MAC Level of consciousness: awake and alert Pain management: pain level controlled Vital Signs Assessment: post-procedure vital signs reviewed and stable Respiratory status: spontaneous breathing, nonlabored ventilation and respiratory function stable Cardiovascular status: stable and blood pressure returned to baseline Anesthetic complications: no    Last Vitals:  Vitals:   09/05/18 0807 09/05/18 0809  BP: (!) 157/76 (!) 145/84  Pulse: 68 71  Resp: 13 16  Temp:  36.6 C  SpO2: 98% 94%    Last Pain:  Vitals:   09/05/18 0809  TempSrc: Temporal  PainSc: 0-No pain                 Audry Pili

## 2018-09-05 NOTE — Transfer of Care (Signed)
Immediate Anesthesia Transfer of Care Note  Patient: Yolanda Espinoza  Procedure(s) Performed: ESOPHAGOGASTRODUODENOSCOPY (EGD) WITH PROPOFOL (N/A ) COLONOSCOPY WITH PROPOFOL (N/A )  Patient Location: Endoscopy Unit  Anesthesia Type:MAC  Level of Consciousness: awake, alert , oriented and patient cooperative  Airway & Oxygen Therapy: Patient Spontanous Breathing and Patient connected to nasal cannula oxygen  Post-op Assessment: Report given to RN, Post -op Vital signs reviewed and stable and Patient moving all extremities  Post vital signs: Reviewed and stable  Last Vitals:  Vitals Value Taken Time  BP    Temp 36.4 C 09/05/18 0758  Pulse 70 09/05/18 0758  Resp 17 09/05/18 0758  SpO2 100 % 09/05/18 0758    Last Pain:  Vitals:   09/05/18 0758  TempSrc: Temporal  PainSc: 0-No pain         Complications: No apparent anesthesia complications

## 2018-09-05 NOTE — Op Note (Signed)
[facility] Patient Name: Yolanda Espinoza Procedure Date: 09/05/2018 MRN: [id number] Attending MD: Juanita Craver , MD Date of Birth: 1955/11/13 CSN: [id number] Age: 63 Admit Type: Outpatient Procedure:                Diagnostic EGD. Indications:              Iron deficiency anemia, Gastroesophageal reflux                            disease Providers:                Juanita Craver, MD, Baird Cancer, RN, Janie Billups,                            Technician, [employer] CRNA. Referring MD:             Vicie Mutters, PA-C. Medicines:                Monitored Anesthesia Care Complications:            No immediate complications. Estimated Blood Loss:     Estimated blood loss: none. Procedure:                Pre-Anesthesia Assessment: - Prior to the                            procedure, a history and physical was performed,                            and patient medications and allergies were                            reviewed. The patient's tolerance of previous                            anesthesia was also reviewed. The risks and                            benefits of the procedure and the sedation options                            and risks were discussed with the patient. All                            questions were answered, and informed consent was                            obtained. Prior Anticoagulants: The patient has                            taken Brilinta; last dose was 5 days prior to                            procedure. ASA Grade Assessment: III - A patient  with severe systemic disease. After reviewing the                            risks and benefits, the patient was deemed in                            satisfactory condition to undergo the procedure.                            After obtaining informed consent, the endoscope was                            passed under direct vision. Throughout the   procedure, the patient's blood pressure, pulse, and                            oxygen saturations were monitored continuously. The                            GIF-H190 ([id number] Olympus gastroscope was                            introduced through the mouth, and advanced to the                            second part of duodenum. The EGD was accomplished                            without difficulty. The patient tolerated the                            procedure well. Scope In: Scope Out: Findings:      The examined esophagus and the GEJ appeared widely patent and normal.      The entire examined stomach was normal.      The cardia and gastric fundus were normal on retroflexion.      The examined duodenum was normal. Impression:               - Normal appearing, widely patent esophagus and GEJ.                           - Normal appearing stomach.                           - Normal examined duodenum.                           - No specimens collected. Moderate Sedation:      MAC used. Recommendation:           - High fiber diet with augmented water consumption                            daily.                           -  Continue present medications including Brilinta.                           - Return to my office in 4 weeks. Procedure Code(s):        --- Professional ---                           [phone], Esophagogastroduodenoscopy, flexible,                            transoral; diagnostic, including collection of                            specimen(s) by brushing or washing, when performed                            (separate procedure) Diagnosis Code(s):        --- Professional ---                           D50.9, Iron deficiency anemia, unspecified                           K21.9, Gastro-esophageal reflux disease without                            esophagitis CPT copyright 2019 American Medical Association. All rights reserved. The codes documented in this report are preliminary  and upon coder review may  be revised to meet current compliance requirements. Juanita Craver, MD Juanita Craver, MD 09/05/2018 8:03:24 AM This report has been signed electronically. Number of Addenda: 0

## 2018-09-05 NOTE — Op Note (Addendum)
[facility] Patient Name: Yolanda Espinoza Procedure Date: 09/05/2018 MRN: [id number] Attending MD: Juanita Craver , MD Date of Birth: 11/17/1955 CSN: [id number] Age: 63 Admit Type: Outpatient Procedure:                Diagnostic colonoscopy. Indications:              Iron deficiency anemia, CRC screening. Providers:                Juanita Craver, MD, Baird Cancer, RN, Cherylynn Ridges,                            Technician, Caryl Pina CRNA Referring MD:             Vicie Mutters, PA-C Medicines:                Monitored Anesthesia Care Complications:            No immediate complications. Estimated Blood Loss:     Estimated blood loss: none. Procedure:                Pre-Anesthesia Assessment: - Prior to the                            procedure, a history and physical was performed,                            and patient medications and allergies were                            reviewed. The patient's tolerance of previous                            anesthesia was also reviewed. The risks and                            benefits of the procedure and the sedation options                            and risks were discussed with the patient. All                            questions were answered, and informed consent was                            obtained. Prior Anticoagulants: The patient has                            taken Brilinta; last dose was 5 days prior to                            procedure. ASA Grade Assessment: III - A patient                            with severe systemic disease. After reviewing the  risks and benefits, the patient was deemed in                            satisfactory condition to undergo the procedure.                            After obtaining informed consent, the colonoscope                            was passed under direct vision. Throughout the                            procedure, the patient's blood pressure,  pulse, and                            oxygen saturations were monitored continuously. The                            CF-HQ190L ([id number]) Olympus colonoscope was                            introduced through the anus and advanced to the the                            cecum, identified by appendiceal orifice and                            ileocecal valve. The colonoscopy was performed with                            moderate difficulty due to inadequate bowel prep.                            Successful completion of the procedure was aided by                            lavage. The patient tolerated the procedure well.                            The quality of the bowel preparation was fair. The                            ileocecal valve, the appendiceal orifice and the                            rectum were photographed. The bowel preparation                            used was GoLYTELY via split dose instruction. Scope In: 7:41:45 AM Scope Out: 7:52:22 AM Scope Withdrawal Time: 0 hours 6 minutes 19 seconds  Total Procedure Duration: 0 hours 10 minutes 37 seconds  Findings:      The entire examined portion of the colon appeared normal.      No  additional abnormalities were found on retroflexion.      There was a lot of debris in the colon-multiple washes were done; small       ;esions/polyps could be missed. Impression:               - Preparation of the colon was fair at best inspite                            of aggressive lavage.                           - The entire examined colon is normal.                           - No specimens collected. Moderate Sedation:      MAC used. Recommendation:           - High fiber diet with augmented water consumption                            daily.                           - Continue present medications.                           - Return to my office in 4 weeks.                           - Repeat colonoscopy in 5 years with a 8 day prep.                            - If the patient has any abnormal GI symptoms in                            the interim, she has been advised to call the                            office ASAP for further recommendations. Procedure Code(s):        --- Professional ---                           [phone], Colonoscopy, flexible; diagnostic, including                            collection of specimen(s) by brushing or washing,                            when performed (separate procedure) Diagnosis Code(s):        --- Professional ---                           D50.9, Iron deficiency anemia, unspecified                           Z12.11, Encounter for screening for malignant  neoplasm of colon CPT copyright 2019 American [employer] Association. All rights reserved. The codes documented in this report are preliminary and upon coder review may  be revised to meet current compliance requirements. Juanita Craver, MD Juanita Craver, MD 09/05/2018 8:09:36 AM This report has been signed electronically. Number of Addenda: 0

## 2018-09-06 ENCOUNTER — Encounter (HOSPITAL_COMMUNITY): Payer: Self-pay | Admitting: Gastroenterology

## 2018-09-11 DIAGNOSIS — H1045 Other chronic allergic conjunctivitis: Secondary | ICD-10-CM | POA: Diagnosis not present

## 2018-09-11 DIAGNOSIS — H04123 Dry eye syndrome of bilateral lacrimal glands: Secondary | ICD-10-CM | POA: Diagnosis not present

## 2018-09-23 ENCOUNTER — Other Ambulatory Visit: Payer: Self-pay

## 2018-09-23 MED ORDER — ESTROGENS CONJUGATED 0.9 MG PO TABS: ORAL_TABLET | ORAL | 1 refills | Status: DC

## 2018-09-23 MED ORDER — OLMESARTAN MEDOXOMIL 40 MG PO TABS: ORAL_TABLET | ORAL | 1 refills | Status: DC

## 2018-09-26 DIAGNOSIS — G4733 Obstructive sleep apnea (adult) (pediatric): Secondary | ICD-10-CM | POA: Diagnosis not present

## 2018-10-01 DIAGNOSIS — Z86010 Personal history of colonic polyps: Secondary | ICD-10-CM | POA: Diagnosis not present

## 2018-10-01 DIAGNOSIS — D509 Iron deficiency anemia, unspecified: Secondary | ICD-10-CM | POA: Diagnosis not present

## 2018-10-01 DIAGNOSIS — K219 Gastro-esophageal reflux disease without esophagitis: Secondary | ICD-10-CM | POA: Diagnosis not present

## 2018-10-17 ENCOUNTER — Ambulatory Visit (HOSPITAL_COMMUNITY)
Admission: RE | Admit: 2018-10-17 | Discharge: 2018-10-17 | Disposition: A | Discharge status: Home / Self Care | Payer: BC Managed Care – PPO | Attending: Gastroenterology | Admitting: Gastroenterology

## 2018-10-17 ENCOUNTER — Encounter (HOSPITAL_COMMUNITY)
Admission: RE | Disposition: A | Discharge status: Home / Self Care | Payer: Self-pay | Source: Home / Self Care | Attending: Gastroenterology

## 2018-10-17 DIAGNOSIS — D509 Iron deficiency anemia, unspecified: Secondary | ICD-10-CM | POA: Insufficient documentation

## 2018-10-17 HISTORY — PX: GIVENS CAPSULE STUDY: SHX5432

## 2018-10-17 SURGERY — IMAGING PROCEDURE, GI TRACT, INTRALUMINAL, VIA CAPSULE

## 2018-10-17 SURGICAL SUPPLY — 1 items: TOWEL COTTON PACK 4EA (MISCELLANEOUS) ×4 IMPLANT

## 2018-10-17 NOTE — Progress Notes (Signed)
Patient brought to ENDO for capsule study, she ingested capsule with no issues, instructions given to patient and understood. Patient to bring back monitor to front desk next morning (9/25) before she goes to work.

## 2018-10-18 ENCOUNTER — Encounter (HOSPITAL_COMMUNITY): Payer: Self-pay | Admitting: Gastroenterology

## 2018-10-21 ENCOUNTER — Telehealth: Payer: Self-pay | Admitting: *Deleted

## 2018-10-21 DIAGNOSIS — D509 Iron deficiency anemia, unspecified: Secondary | ICD-10-CM | POA: Diagnosis not present

## 2018-10-21 NOTE — Telephone Encounter (Signed)
Order for Cpap faxed to [facility] at [phone].  PAP pressure seeting is 15 cm H20, per sleepstudy from [id number].

## 2018-10-25 DIAGNOSIS — G4733 Obstructive sleep apnea (adult) (pediatric): Secondary | ICD-10-CM | POA: Diagnosis not present

## 2018-10-28 ENCOUNTER — Other Ambulatory Visit: Payer: Self-pay | Admitting: Internal Medicine

## 2018-10-30 ENCOUNTER — Other Ambulatory Visit: Payer: Self-pay

## 2018-10-30 ENCOUNTER — Ambulatory Visit (INDEPENDENT_AMBULATORY_CARE_PROVIDER_SITE_OTHER): Payer: BC Managed Care – PPO

## 2018-10-30 VITALS — Temp 97.8°F

## 2018-10-30 DIAGNOSIS — Z23 Encounter for immunization: Secondary | ICD-10-CM

## 2018-10-30 NOTE — Progress Notes (Addendum)
Patient reports for LOW DOSE FLU.  Patient had a question about her CPAP machine. The company forgot to leave the [place] piece per patient.  Patient was informed to call the COMPANY that supplied the CPAP machine & inform them that she needs the HEAD GEAR.  Patient agreed & voiced understanding.

## 2018-11-11 ENCOUNTER — Other Ambulatory Visit: Payer: Self-pay

## 2018-11-11 ENCOUNTER — Encounter: Payer: Self-pay | Admitting: Physician Assistant

## 2018-11-11 ENCOUNTER — Ambulatory Visit (INDEPENDENT_AMBULATORY_CARE_PROVIDER_SITE_OTHER): Payer: BC Managed Care – PPO | Admitting: Physician Assistant

## 2018-11-11 VITALS — BP 150/82 | HR 73 | Temp 97.2°F | Ht 70.0 in | Wt 223.0 lb

## 2018-11-11 DIAGNOSIS — R8290 Unspecified abnormal findings in urine: Secondary | ICD-10-CM

## 2018-11-11 DIAGNOSIS — D649 Anemia, unspecified: Secondary | ICD-10-CM | POA: Diagnosis not present

## 2018-11-11 DIAGNOSIS — R1032 Left lower quadrant pain: Secondary | ICD-10-CM

## 2018-11-11 MED ORDER — TRAMADOL HCL 50 MG PO TABS: ORAL_TABLET | ORAL | 0 refills | Status: DC

## 2018-11-11 MED ORDER — KETOROLAC TROMETHAMINE 60 MG/2ML IM SOLN
60.0000 mg | Freq: Once | INTRAMUSCULAR | Status: AC
  Administered 2018-11-11: 60 mg via INTRAMUSCULAR

## 2018-11-11 MED ORDER — DICLOFENAC SODIUM 75 MG PO TBEC: 75.0000 mg | DELAYED_RELEASE_TABLET | Freq: Two times a day (BID) | ORAL | 0 refills | Status: DC

## 2018-11-11 NOTE — Progress Notes (Signed)
Subjective:    Patient ID: Yolanda Espinoza, female    DOB: 04-01-1955, 63 y.o.   MRN: [id number]  HPI 63 y.o. obese AAF with history of HTN, diastolic heart failure, OSA on CPAP, gout presents with suprapubic pain x 1 week, 63 describes it as groin pain and thinks it is from catheter access from over a year ago but after further evaluation it is more lower AB.  She states she walks daily up and down stairs for exercise, has been able to do it until last Friday morning she woke up with pain. Throbbing pain/menstrual cramp pain, hurts worse with bending, standing too long hurts, left lower quadrant.  No fever, chills, blood in urine, burning with urination, normal urine, had BM this AM, goes daily, no dark black stool/blood in stool. No nausea.  Warm heating pad helps. Some left flank pain.  Last angiogram was 02/2017 Has been taking ibuprofen.   Blood pressure (!) 150/82, pulse 73, temperature (!) 97.2 F (36.2 C), height 5\' 10"  (1.778 m), weight 223 lb (101.2 kg), SpO2 97 %.  Medications  Current Outpatient Medications (Endocrine & Metabolic):  .  estrogens, conjugated, (PREMARIN) 0.9 MG tablet, TAKE (1) TABLET BY MOUTH DAILY   Current Outpatient Medications (Cardiovascular):  .  amLODipine (NORVASC) 10 MG tablet, TAKE (1) TABLET BY MOUTH DAILY (Patient taking differently: Take 10 mg by mouth daily. ) .  furosemide (LASIX) 40 MG tablet, TAKE (1) TABLET BY MOUTH DAILY AS NEEDED FOR EDEMA (Patient taking differently: Take 40 mg by mouth daily. ) .  olmesartan (BENICAR) 40 MG tablet, TAKE (1) TABLET BY MOUTH DAILY     Current Outpatient Medications (Analgesics):  .  aspirin EC 81 MG tablet, Take 1 tablet (81 mg total) by mouth daily. (Patient taking differently: Take 81 mg by mouth at bedtime. ) .  diclofenac (VOLTAREN) 75 MG EC tablet, Take 1 tablet (75 mg total) by mouth 2 (two) times daily. .  traMADol (ULTRAM) 50 MG tablet, 1 tablet as needed up to 3 x a daily for severe pain  Current  Facility-Administered Medications (Analgesics):  .  ketorolac (TORADOL) injection 60 mg  Current Outpatient Medications (Hematological):  .  ferrous sulfate 325 (65 FE) MG tablet, Take 325 mg by mouth daily with breakfast. .  ticagrelor (BRILINTA) 90 MG TABS tablet, Take 1 tablet 2 x /day to Prevent Blood Clots (Patient taking differently: Take 90 mg by mouth 2 (two) times daily. Take 1 tablet 2 x /day to Prevent Blood Clots) .  vitamin B-12 (CYANOCOBALAMIN) 500 MCG tablet, Take 1,000 mcg by mouth daily.   Current Outpatient Medications (Other):  Marland Kitchen  Calcium Carb-Cholecalciferol (CALCIUM 600 + D PO), Take 1 tablet by mouth daily. .  cholecalciferol (VITAMIN D3) 25 MCG (1000 UT) tablet, Take 1,000 Units by mouth daily. .  cyclobenzaprine (FLEXERIL) 10 MG tablet, TAKE 1 TABLET BY MOUTH AT BEDTIME AS NEEDED FOR MUSCLE SPASMS (Patient taking differently: Take 10 mg by mouth at bedtime as needed for muscle spasms. ) .  diclofenac sodium (VOLTAREN) 1 % GEL, Apply 2 g topically 4 (four) times daily. Right wrist and Right elbow (Patient taking differently: Apply 2 g topically 4 (four) times daily as needed (pain). Right wrist and Right elbow) .  gabapentin (NEURONTIN) 300 MG capsule, Take 300 mg by mouth 3 (three) times daily. Marland Kitchen  levETIRAcetam (KEPPRA) 1000 MG tablet, TAKE 1 TABLET BY MOUTH TWICE DAILY (Patient taking differently: Take 1,000 mg by mouth 2 (two) times  daily. ) .  Multiple Vitamins-Minerals (MULTIVITAMIN WITH MINERALS) tablet, Take 1 tablet by mouth daily. .  Omega-3 Fatty Acids (FISH OIL) 1000 MG CAPS, Take 1,000 mg by mouth daily. .  pantoprazole (PROTONIX) 40 MG tablet, Take 1 tablet Daily for Indigestion & Heartburn   Problem list She has Essential hypertension; Mitral regurgitation; Chronic diastolic heart failure ([place]); OSA on CPAP; Vitamin D deficiency; Hyperlipidemia; Brain aneurysm; Abnormal glucose; Medication management; Obesity (BMI 30.0-34.9); Gout; Seizure ([place]); and  Gastroesophageal reflux disease on their problem list.   Review of Systems     Objective:   Physical Exam General Appearance: Well nourished, in no apparent distress.  Eyes: PERRLA, EOMs, conjunctiva no swelling or erythema, normal fundi and vessels.  Sinuses: No Frontal/maxillary tenderness  ENT/Mouth: Ext aud canals clear, normal light reflex with TMs without erythema, bulging. Good dentition. No erythema, swelling, or exudate on post pharynx. Tonsils not swollen or erythematous. Hearing normal.  Neck: Supple, thyroid slightly enlarged. No bruits  Respiratory: Respiratory effort normal, BS equal bilaterally without rales, rhonchi, wheezing or stridor.  Cardio: RRR with holosystolic murmur RSB, without rubs or gallops. Brisk peripheral pulses with minimal edema.  Chest: symmetric, with normal excursions and percussion.  Abdomen: Soft, + diffuse lower AB tender, worse LLQ, NO rebound, neg mcburney, + left sided CVA tenderness, no guarding, rebound, hernias, masses, or organomegaly.  Lymphatics: Non tender without lymphadenopathy.  Musculoskeletal: Full ROM all peripheral extremities,5/5 strength, and normal gait. Some pain with flexion of left hip, non tender to palpation left lumbar/thoracic.  Skin: Warm, dry without rashes, lesions, ecchymosis. Neuro: Cranial nerves intact, reflexes equal bilaterally. Normal muscle tone, no cerebellar symptoms.  Psych: Awake and oriented X 3, normal affect, Insight and Judgment appropriate.        Assessment & Plan:   Left lower quadrant abdominal pain Will get a left hip xray however the pain seems more lower AB.  ? Kidney stone, diverticulitis, muscular.  No rebound, vitals normal, no symptoms to suggest infection, + CVA tenderness, will get labs and a CT AB to rule out above.  Patient agrees with plan and understands to the ER if she has any severe AB pain, unable to hold down food/water, blood in stool or vomit, chest pain, shortness of breath,  or any worsening symptoms.  -     DG HIP UNILAT W OR W/O PELVIS 2-3 VIEWS LEFT; Future -     CBC with Differential/Platelet -     COMPLETE METABOLIC PANEL WITH GFR -     CT ABDOMEN PELVIS W CONTRAST; Future -     Cancel: CT Abdomen Pelvis Wo Contrast; Future -     ketorolac (TORADOL) injection 60 mg -     traMADol (ULTRAM) 50 MG tablet; 1 tablet as needed up to 3 x a daily for severe pain -     diclofenac (VOLTAREN) 75 MG EC tablet; Take 1 tablet (75 mg total) by mouth 2 (two) times daily.

## 2018-11-11 NOTE — Patient Instructions (Signed)
INFORMATION ABOUT YOUR XRAY  Can walk into [address] building for an Insurance account manager. They will have the order and take you back. You do not any paper work, I should get the result back today or tomorrow. This order is good for a year.  Can call [phone] to schedule an appointment if you wish.   We are getting stat labs on you and getting a CT AB to look for a kidney stone or diverticulitis  From your symptoms I think you may have a kidney stone.   Please go to the ER if you have any severe AB pain, unable to hold down food/water, blood in stool or vomit, chest pain, shortness of breath, or any worsening symptoms.    Kidney Stones  Kidney stones (urolithiasis) are solid, rock-like deposits that form inside of the organs that make urine (kidneys). A kidney stone may form in a kidney and move into the bladder, where it can cause intense pain and block the flow of urine. Kidney stones are created when high levels of certain minerals are found in the urine. They are usually passed through urination, but in some cases, medical treatment may be needed to remove them. What are the causes? Kidney stones may be caused by:  A condition in which certain glands produce too much parathyroid hormone (primary hyperparathyroidism), which causes too much calcium buildup in the blood.  Buildup of uric acid crystals in the bladder (hyperuricosuria). Uric acid is a chemical that the body produces when you eat certain foods. It usually exits the body in the urine.  Narrowing (stricture) of one or both of the tubes that drain urine from the kidneys to the bladder (ureters).  A kidney blockage that is present at birth (congenital obstruction).  Past surgery on the kidney or the ureters, such as gastric bypass surgery. What increases the risk? The following factors make you more likely to develop kidney stones:  Having had a kidney stone in the past.  Having a family history of kidney stones.  Not drinking  enough water.  Eating a diet that is high in protein, salt (sodium), or sugar.  Being overweight or obese. What are the signs or symptoms? Symptoms of a kidney stone may include:  Nausea.  Vomiting.  Blood in the urine (hematuria).  Pain in the side of the abdomen, right below the ribs (flank pain). Pain usually spreads (radiates) to the groin.  Needing to urinate frequently or urgently. How is this diagnosed? This condition may be diagnosed based on:  Your medical history.  A physical exam.  Blood tests.  Urine tests.  CT scan.  Abdominal X-ray.  A procedure to examine the inside of the bladder (cystoscopy). How is this treated? Treatment for kidney stones depends on the size, location, and makeup of the stones. Treatment may involve:  Analyzing your urine before and after you pass the stone through urination.  Being monitored at the hospital until you pass the stone through urination.  Increasing your fluid intake and decreasing the amount of calcium and protein in your diet.  A procedure to break up kidney stones in the bladder using: ? A focused beam of light (laser therapy). ? Shock waves (extracorporeal shock wave lithotripsy).  Surgery to remove kidney stones. This may be needed if you have severe pain or have stones that block your urinary tract. Follow these instructions at home: Eating and drinking  Drink enough fluid to keep your urine clear or pale yellow. This will help  you to pass the kidney stone.  If directed, change your diet. This may include: ? Limiting how much sodium you eat. ? Eating more fruits and vegetables. ? Limiting how much meat, poultry, fish, and eggs you eat.  Follow instructions from your health care provider about eating or drinking restrictions. General instructions  Collect urine samples as told by your health care provider. You may need to collect a urine sample: ? 24 hours after you pass the stone. ? 8-12 weeks after  passing the kidney stone, and every 6-12 months after that.  Strain your urine every time you urinate, for as long as directed. Use the strainer that your health care provider recommends.  Do not throw out the kidney stone after passing it. Keep the stone so it can be tested by your health care provider. Testing the makeup of your kidney stone may help prevent you from getting kidney stones in the future.  Take over-the-counter and prescription medicines only as told by your health care provider.  Keep all follow-up visits as told by your health care provider. This is important. You may need follow-up X-rays or ultrasounds to make sure that your stone has passed. How is this prevented? To prevent another kidney stone:  Drink enough fluid to keep your urine clear or pale yellow. This is the best way to prevent kidney stones.  Eat a healthy diet and follow recommendations from your health care provider about foods to avoid. You may be instructed to eat a low-protein diet. Recommendations vary depending on the type of kidney stone that you have.  Maintain a healthy weight. Contact a health care provider if:  You have pain that gets worse or does not get better with medicine. Get help right away if:  You have a fever or chills.  You develop severe pain.  You develop new abdominal pain.  You faint.  You are unable to urinate. This information is not intended to replace advice given to you by your health care provider. Make sure you discuss any questions you have with your health care provider. Document Released: 01/09/2005 Document Revised: 08/21/2017 Document Reviewed: 06/25/2015 Elsevier Patient Education  2020 [place].   Abdominal Pain, Adult Abdominal pain can be caused by many things. Often, abdominal pain is not serious and it gets better with no treatment or by being treated at home. However, sometimes abdominal pain is serious. Your health care provider will do a medical  history and a physical exam to try to determine the cause of your abdominal pain. Follow these instructions at home:  Take over-the-counter and prescription medicines only as told by your health care provider. Do not take a laxative unless told by your health care provider.  Drink enough fluid to keep your urine clear or pale yellow.  Watch your condition for any changes.  Keep all follow-up visits as told by your health care provider. This is important. Contact a health care provider if:  Your abdominal pain changes or gets worse.  You are not hungry or you lose weight without trying.  You are constipated or have diarrhea for more than 2-3 days.  You have pain when you urinate or have a bowel movement.  Your abdominal pain wakes you up at night.  Your pain gets worse with meals, after eating, or with certain foods.  You are throwing up and cannot keep anything down.  You have a fever. Get help right away if:  Your pain does not go away as  soon as your health care provider told you to expect.  You cannot stop throwing up.  Your pain is only in areas of the abdomen, such as the right side or the left lower portion of the abdomen.  You have bloody or black stools, or stools that look like tar.  You have severe pain, cramping, or bloating in your abdomen.  You have signs of dehydration, such as: ? Dark urine, very little urine, or no urine. ? Cracked lips. ? Dry mouth. ? Sunken eyes. ? Sleepiness. ? Weakness. This information is not intended to replace advice given to you by your health care provider. Make sure you discuss any questions you have with your health care provider. Document Released: 10/19/2004 Document Revised: 07/30/2015 Document Reviewed: 06/23/2015 Elsevier Interactive Patient Education  [employer].

## 2018-11-12 ENCOUNTER — Other Ambulatory Visit: Payer: BC Managed Care – PPO

## 2018-11-13 DIAGNOSIS — H2513 Age-related nuclear cataract, bilateral: Secondary | ICD-10-CM | POA: Diagnosis not present

## 2018-11-13 DIAGNOSIS — H04123 Dry eye syndrome of bilateral lacrimal glands: Secondary | ICD-10-CM | POA: Diagnosis not present

## 2018-11-13 DIAGNOSIS — H10413 Chronic giant papillary conjunctivitis, bilateral: Secondary | ICD-10-CM | POA: Diagnosis not present

## 2018-11-13 LAB — CBC WITH DIFFERENTIAL/PLATELET
Absolute Monocytes: 432 cells/uL (ref 200–950)
Basophils Absolute: 47 cells/uL (ref 0–200)
Basophils Relative: 0.5 %
Eosinophils Absolute: 273 cells/uL (ref 15–500)
Eosinophils Relative: 2.9 %
HCT: 30.6 % — ABNORMAL LOW (ref 35.0–45.0)
Hemoglobin: 9.7 g/dL — ABNORMAL LOW (ref 11.7–15.5)
Lymphs Abs: 4042 cells/uL — ABNORMAL HIGH (ref 850–3900)
MCH: 27.1 pg (ref 27.0–33.0)
MCHC: 31.7 g/dL — ABNORMAL LOW (ref 32.0–36.0)
MCV: 85.5 fL (ref 80.0–100.0)
MPV: 10.1 fL (ref 7.5–12.5)
Monocytes Relative: 4.6 %
Neutro Abs: 4606 cells/uL (ref 1500–7800)
Neutrophils Relative %: 49 %
Platelets: 291 10*3/uL (ref 140–400)
RBC: 3.58 10*6/uL — ABNORMAL LOW (ref 3.80–5.10)
RDW: 14.6 % (ref 11.0–15.0)
Total Lymphocyte: 43 %
WBC: 9.4 10*3/uL (ref 3.8–10.8)

## 2018-11-13 LAB — URINALYSIS, ROUTINE W REFLEX MICROSCOPIC
Bacteria, UA: NONE SEEN /HPF
Bilirubin Urine: NEGATIVE
Glucose, UA: NEGATIVE
Hgb urine dipstick: NEGATIVE
Hyaline Cast: NONE SEEN /LPF
Ketones, ur: NEGATIVE
Leukocytes,Ua: NEGATIVE
Nitrite: NEGATIVE
Specific Gravity, Urine: 1.008 (ref 1.001–1.03)
Squamous Epithelial / HPF: NONE SEEN /HPF (ref <=5)
WBC, UA: NONE SEEN /HPF (ref 0–5)
pH: <=5 (ref 5.0–8.0)

## 2018-11-13 LAB — COMPLETE METABOLIC PANEL WITH GFR
AG Ratio: 1.4 (calc) (ref 1.0–2.5)
ALT: 5 U/L — ABNORMAL LOW (ref 6–29)
AST: 13 U/L (ref 10–35)
Albumin: 4.4 g/dL (ref 3.6–5.1)
Alkaline phosphatase (APISO): 55 U/L (ref 37–153)
BUN/Creatinine Ratio: 9 (calc) (ref 6–22)
BUN: 12 mg/dL (ref 7–25)
CO2: 25 mmol/L (ref 20–32)
Calcium: 9.4 mg/dL (ref 8.6–10.4)
Chloride: 103 mmol/L (ref 98–110)
Creat: 1.3 mg/dL — ABNORMAL HIGH (ref 0.50–0.99)
GFR, Est African American: 51 mL/min/{1.73_m2} — ABNORMAL LOW (ref >=60)
GFR, Est Non African American: 44 mL/min/{1.73_m2} — ABNORMAL LOW (ref >=60)
Globulin: 3.1 g/dL (calc) (ref 1.9–3.7)
Glucose, Bld: 86 mg/dL (ref 65–99)
Potassium: 4.3 mmol/L (ref 3.5–5.3)
Sodium: 136 mmol/L (ref 135–146)
Total Bilirubin: 0.3 mg/dL (ref 0.2–1.2)
Total Protein: 7.5 g/dL (ref 6.1–8.1)

## 2018-11-13 LAB — URINE CULTURE
MICRO NUMBER:: 1003894
SPECIMEN QUALITY:: ADEQUATE

## 2018-11-13 LAB — IRON,TIBC AND FERRITIN PANEL
%SAT: 13 % (calc) — ABNORMAL LOW (ref 16–45)
Ferritin: 60 ng/mL (ref 16–288)
Iron: 46 ug/dL (ref 45–160)
TIBC: 361 mcg/dL (calc) (ref 250–450)

## 2018-11-13 LAB — TEST AUTHORIZATION

## 2018-11-14 ENCOUNTER — Other Ambulatory Visit: Payer: Self-pay | Admitting: Physician Assistant

## 2018-11-14 DIAGNOSIS — I5032 Chronic diastolic (congestive) heart failure: Secondary | ICD-10-CM

## 2018-11-18 ENCOUNTER — Other Ambulatory Visit: Payer: Self-pay

## 2018-11-18 ENCOUNTER — Ambulatory Visit
Admission: RE | Admit: 2018-11-18 | Discharge: 2018-11-18 | Disposition: A | Discharge status: Home / Self Care | Payer: BC Managed Care – PPO | Source: Ambulatory Visit | Attending: Physician Assistant | Admitting: Physician Assistant

## 2018-11-18 DIAGNOSIS — R1032 Left lower quadrant pain: Secondary | ICD-10-CM

## 2018-11-21 ENCOUNTER — Other Ambulatory Visit: Payer: Self-pay | Admitting: Physician Assistant

## 2018-11-21 DIAGNOSIS — M545 Low back pain, unspecified: Secondary | ICD-10-CM

## 2018-11-25 DIAGNOSIS — G4733 Obstructive sleep apnea (adult) (pediatric): Secondary | ICD-10-CM | POA: Diagnosis not present

## 2018-11-26 ENCOUNTER — Ambulatory Visit
Admission: RE | Admit: 2018-11-26 | Discharge: 2018-11-26 | Disposition: A | Discharge status: Home / Self Care | Payer: BC Managed Care – PPO | Source: Ambulatory Visit | Attending: Physician Assistant | Admitting: Physician Assistant

## 2018-11-26 ENCOUNTER — Other Ambulatory Visit: Payer: Self-pay

## 2018-11-26 DIAGNOSIS — R1032 Left lower quadrant pain: Secondary | ICD-10-CM | POA: Diagnosis not present

## 2018-11-26 MED ORDER — IOPAMIDOL (ISOVUE-300) INJECTION 61%
100.0000 mL | Freq: Once | INTRAVENOUS | Status: AC | PRN
  Administered 2018-11-26: 100 mL via INTRAVENOUS

## 2018-12-23 ENCOUNTER — Other Ambulatory Visit: Payer: Self-pay | Admitting: Physician Assistant

## 2018-12-23 DIAGNOSIS — R1032 Left lower quadrant pain: Secondary | ICD-10-CM

## 2018-12-25 DIAGNOSIS — G4733 Obstructive sleep apnea (adult) (pediatric): Secondary | ICD-10-CM | POA: Diagnosis not present

## 2018-12-26 DIAGNOSIS — Z119 Encounter for screening for infectious and parasitic diseases, unspecified: Secondary | ICD-10-CM | POA: Diagnosis not present

## 2019-01-06 ENCOUNTER — Other Ambulatory Visit: Payer: Self-pay | Admitting: Physical Medicine & Rehabilitation

## 2019-01-06 NOTE — Telephone Encounter (Signed)
Called in Gabapentin left note that patient needs an appt to get further refills. One time refill

## 2019-01-20 NOTE — Progress Notes (Signed)
Complete Physical  Assessment and Plan:  Gastroesophageal reflux disease, esophagitis presence not specified Neg H pylori, continue protonix  Screening, anemia, deficiency, iron -     Iron,Total/Total Iron Binding Cap -     Vitamin B12  Essential hypertension - continue medications, DASH diet, exercise and monitor at home. Call if greater than 130/80.  - CBC with Differential/Platelet - BASIC METABOLIC PANEL WITH GFR - Hepatic function panel - TSH - Urinalysis, Routine w reflex microscopic (not at [facility]) - Microalbumin / creatinine urine ratio  Abnormal glucose Discussed general issues about diabetes pathophysiology and management., Educational material distributed., Suggested low cholesterol diet., Encouraged aerobic exercise., Discussed foot care., Reminded to get yearly retinal exam. - Hemoglobin A1c  Hyperlipidemia -continue medications, check lipids, decrease fatty foods, increase activity. - Lipid panel  Obesity Obesity with co morbidities- long discussion about weight loss, diet, and exercise  Chronic diastolic heart failure WEIGHT DOWN, CONTINUE WEIGHT LOSS   Mitral regurgitation Monitored by cardio, no SOB/CP  Vitamin D deficiency - Vit D  25 hydroxy (rtn osteoporosis monitoring)  Medication management - Magnesium  OSA on CPAP She is on CPAP   Brain aneurysm Control blood pressure, cholesterol, glucose, increase exercise.   Gout of foot, unspecified cause, unspecified chronicity, unspecified laterality - Gout- recheck Uric acid as needed, Diet discussed, continue medications.  Encounter for general adult medical examination with abnormal findings 1 year  BMI 31.0-31.9,adult  Overweight  - long discussion about weight loss, diet, and exercise -recommended diet heavy in fruits and veggies and low in animal meats, cheeses, and dairy products   Discussed med's effects and SE's. Screening labs and tests as requested with regular follow-up as  recommended. Over 40 minutes of exam, counseling, chart review, and complex, high level critical decision making was performed this visit.  Future Appointments  Date Time Provider [place]  01/26/2020 10:00 AM Vicie Mutters, PA-C [id number] None    HPI  63 y.o. female  presents for a complete physical.  She is back at work but going to cut back her hours in march.  She has her MRI next month. She has history of CVA due to aneurysm, PVD and MR, follows with Dr. Sondra Come.   Had stable MRA brain 06/2017, getting one next month.  She is on brilinta. She is on Keppra,  and she is on estrogen pills.  Last echo was 11/01/2016. Normal EF and moderate diastolic heart dysfunction.   Sister recently diagnosed with colon cancer at age 85, no other family history .  Her blood pressure has been controlled at home, states has been good at home, today their BP is BP: (!) 148/84 She does workout, walks daily for 30 mins. She denies chest pain, shortness of breath, dizziness.   BMI is Body mass index is 30.8 kg/m., she is working on diet and exercise. She has lost 10 lbs.  She has OSA and is on CPAP.  Wt Readings from Last 3 Encounters:  01/23/19 211 lb 9.6 oz (96 kg)  11/11/18 223 lb (101.2 kg)  09/05/18 214 lb (97.1 kg)   She is not on cholesterol medication and denies myalgias. Her cholesterol is at goal. The cholesterol last visit was:   Lab Results  Component Value Date   CHOL 192 07/22/2018   HDL 103 07/22/2018   LDLCALC 69 07/22/2018   TRIG 113 07/22/2018   CHOLHDL 1.9 07/22/2018   She has been working on diet and exercise for prediabetes, and denies paresthesia of the  feet, polydipsia, polyuria and visual disturbances. Last A1C in the office was:  Lab Results  Component Value Date   HGBA1C 5.3 07/22/2018    Patient is NOT on allopurinol for gout, she is on colchicine and does not report a recent flare.  Lab Results  Component Value Date   LABURIC 4.2 10/23/2014   Patient  is on Vitamin D supplement, 1000 IU.   Lab Results  Component Value Date   VD25OH 36 07/22/2018      Current Medications:   Current Outpatient Medications (Endocrine & Metabolic):  .  estrogens, conjugated, (PREMARIN) 0.9 MG tablet, TAKE (1) TABLET BY MOUTH DAILY  Current Outpatient Medications (Cardiovascular):  .  amLODipine (NORVASC) 10 MG tablet, Take 1 tablet Daily for BP .  furosemide (LASIX) 40 MG tablet, Take 1 tablet Daily for BP, Fluid Retention & Ankle Swelling .  olmesartan (BENICAR) 40 MG tablet, TAKE (1) TABLET BY MOUTH DAILY   Current Outpatient Medications (Analgesics):  .  aspirin EC 81 MG tablet, Take 1 tablet (81 mg total) by mouth daily. (Patient taking differently: Take 81 mg by mouth at bedtime. ) .  diclofenac (VOLTAREN) 75 MG EC tablet, Take 1 tablet (75 mg total) by mouth 2 (two) times daily. .  traMADol (ULTRAM) 50 MG tablet, 1 tablet as needed up to 3 x a daily for severe pain  Current Outpatient Medications (Hematological):  .  ferrous sulfate 325 (65 FE) MG tablet, Take 325 mg by mouth daily with breakfast. .  ticagrelor (BRILINTA) 90 MG TABS tablet, Take 1 tablet 2 x /day to Prevent Blood Clots (Patient taking differently: Take 90 mg by mouth 2 (two) times daily. Take 1 tablet 2 x /day to Prevent Blood Clots) .  vitamin B-12 (CYANOCOBALAMIN) 500 MCG tablet, Take 1,000 mcg by mouth daily.  Current Outpatient Medications (Other):  Marland Kitchen  Calcium Carb-Cholecalciferol (CALCIUM 600 + D PO), Take 1 tablet by mouth daily. .  cholecalciferol (VITAMIN D3) 25 MCG (1000 UT) tablet, Take 1,000 Units by mouth daily. .  cyclobenzaprine (FLEXERIL) 10 MG tablet, Take 1 tablet at Bedtime for Muscle Spasms .  diclofenac sodium (VOLTAREN) 1 % GEL, Apply 2 g topically 4 (four) times daily. Right wrist and Right elbow (Patient taking differently: Apply 2 g topically 4 (four) times daily as needed (pain). Right wrist and Right elbow) .  gabapentin (NEURONTIN) 300 MG capsule, TAKE  1 CAPSULE BY MOUTH THREE TIMES DAILY .  levETIRAcetam (KEPPRA) 1000 MG tablet, Take 1 tablet 2 x /day to Prevent Seizures .  Multiple Vitamins-Minerals (MULTIVITAMIN WITH MINERALS) tablet, Take 1 tablet by mouth daily. .  Omega-3 Fatty Acids (FISH OIL) 1000 MG CAPS, Take 1,000 mg by mouth daily. .  pantoprazole (PROTONIX) 40 MG tablet, Take 1 tablet Daily for Indigestion & Heartburn  Health Maintenance:   Immunization History  Administered Date(s) Administered  . Influenza Inj Mdck Quad With Preservative 10/30/2018  . Influenza Split 11/11/2013, 10/23/2014  . Influenza, Seasonal, Injecte, Preservative Fre 10/26/2015  . Influenza,inj,Quad PF,6+ Mos 02/23/2017  . Influenza-Unspecified 11/25/2012  . Tdap 08/25/2013   Tetanus: 2015 Pneumovax: N/A Prevnar 13: N/A Flu vaccine: 2020 Zostavax: N/A LMP: TAH Pap: TAH  MGM:02/2018 DEXA: N/A Colonoscopy: 10/2018 Dr. Collene Mares EGD: N/A CT head 2010 CT AB 11/2018 CXR 2015 MRA 01/2018 Renal US 2017 Echo 10/2016, normal EF, mil-mod AR  Last Dental Exam: Has partial/bridges, no dentist.  Last Eye Exam: Dr. Hassell Done 2019, wears glasses Patient Care Team: Unk Pinto, MD as  PCP - General (Internal Medicine) Unk Pinto, MD as PCP - Internal Medicine (Internal Medicine) Nahser, Wonda Cheng, MD as PCP - Cardiology (Cardiology) Sharmon Revere as Physician Assistant (Physician Assistant) Juanita Craver, MD as Consulting Physician (Gastroenterology) Mcarthur Rossetti, MD as Consulting Physician (Orthopedic Surgery) Unk Pinto, MD as Consulting Physician (Internal Medicine) Luanne Bras, MD as Consulting Physician (Interventional Radiology)  Medical History:  Past Medical History:  Diagnosis Date  . Anemia   . Arthritis   . Chronic diastolic heart failure ([place])   . GERD (gastroesophageal reflux disease)   . Heart murmur   . Hyperlipidemia   . Hypertension may, 1973  . Mitral regurgitation    a. Echo 01/2012 mild  LVH, EF 55-65%, Gr 2 DD, mod MR, mild LAE, PASP 36;  b. Echo (02/2013):  EF 55-60%, Gr 1 DD, mild to mod MR, mild LAE (no sig change since prior echo) // c. Echo 10/18: EF 55-60, normal wall motion, grade 2 diastolic dysfunction, trivial AI, mild to moderate MR, mild LAE, trivial PI, PASP 38    . OSA on CPAP    7 yrs  . Peripheral vascular disease ([place])    cerebral aneursym  . Seizures ([place]) 01/2017  . Stroke [facility]) 07   no weakness jan 29th 2019 no defecits  . Subarachnoid hemorrhage due to ruptured aneurysm ([place])    2003 - s/p coiling  . Unspecified vitamin D deficiency    Allergies Allergies  Allergen Reactions  . Ace Inhibitors Nausea And Vomiting  . Aspirin Nausea And Vomiting    Can take coated Aspirin   SURGICAL HISTORY She  has a past surgical history that includes Aneurysm coiling; head surgery; Tonsillectomy and adenoidectomy; Radiology with anesthesia (N/A, 04/23/2013); IR 3D Independent Wkst (09/04/2016); IR Angiogram Extremity Left (09/04/2016); IR ANGIO INTRA EXTRACRAN SEL COM CAROTID INNOMINATE BILAT MOD SED (09/04/2016); IR ANGIO VERTEBRAL SEL VERTEBRAL BILAT MOD SED (09/04/2016); IR Radiologist Eval & Mgmt (10/11/2016); Radiology with anesthesia (N/A, 10/30/2016); Tubal ligation; Radiology with anesthesia (N/A, 02/21/2017); IR Transcath/Emboliz (02/21/2017); IR ANGIO INTRA EXTRACRAN SEL INTERNAL CAROTID UNI L MOD SED (02/21/2017); IR NEURO EACH ADD'L AFTER BASIC UNI LEFT (MS) (02/21/2017); IR Angiogram Follow Up Study (02/21/2017); IR Radiologist Eval & Mgmt (03/07/2017); Abdominal hysterectomy; Esophagogastroduodenoscopy (egd) with propofol (N/A, 09/05/2018); Colonoscopy with propofol (N/A, 09/05/2018); and Givens capsule study (N/A, 10/17/2018).   FAMILY HISTORY Her family history includes Hypertension in her father, sister, sister, and sister; Kidney disease in her father.   SOCIAL HISTORY She  reports that she quit smoking about 20 years ago. Her smoking use included cigarettes. She has a  0.75 pack-year smoking history. She has never used smokeless tobacco. She reports that she does not drink alcohol or use drugs.  Review of Systems: Review of Systems  Constitutional: Negative.   HENT: Negative.   Respiratory: Positive for cough (dry). Negative for hemoptysis, sputum production, shortness of breath and wheezing.   Cardiovascular: Negative.  Negative for chest pain, palpitations and leg swelling (resolved).  Gastrointestinal: Positive for heartburn. Negative for abdominal pain, blood in stool, constipation, diarrhea, melena, nausea and vomiting.  Genitourinary: Negative.   Musculoskeletal: Positive for back pain and joint pain. Negative for falls, myalgias and neck pain.  Skin: Negative.   Neurological: Negative.  Negative for dizziness and headaches.  Psychiatric/Behavioral: Negative.  Negative for depression, hallucinations, memory loss, substance abuse and suicidal ideas. The patient is not nervous/anxious and does not have insomnia.     Physical Exam: Estimated body  mass index is 30.8 kg/m as calculated from the following:   Height as of this encounter: 5' 9.5" (1.765 m).   Weight as of this encounter: 211 lb 9.6 oz (96 kg). BP (!) 148/84   Pulse 74   Temp (!) 96.1 F (35.6 C)   Ht 5' 9.5" (1.765 m)   Wt 211 lb 9.6 oz (96 kg)   SpO2 98%   BMI 30.80 kg/m  General Appearance: Well nourished, in no apparent distress.  Eyes: PERRLA, EOMs, conjunctiva no swelling or erythema, normal fundi and vessels.  Sinuses: No Frontal/maxillary tenderness  ENT/Mouth: Ext aud canals clear, normal light reflex with TMs without erythema, bulging.  No erythema, swelling, or exudate on post pharynx. Tonsils not swollen or erythematous. Hearing normal.  Neck: Supple, thyroid slightly enlarged. No bruits  Respiratory: Respiratory effort normal, BS equal bilaterally without rales, rhonchi, wheezing or stridor.  Cardio: RRR with holosystolic murmur LSB, without rubs or gallops. Brisk  peripheral pulses with 2+ edema.  Chest: symmetric, with normal excursions and percussion.  Breasts: declines  Abdomen: Soft, nontender, no guarding, rebound, hernias, masses, or organomegaly.  Lymphatics: Non tender without lymphadenopathy.  Genitourinary: defer Musculoskeletal: Full ROM all peripheral extremities,5/5 strength, and antalgic gait.  Skin: Warm, dry without rashes, lesions, ecchymosis. Neuro: Cranial nerves intact, reflexes equal bilaterally. Normal muscle tone, no cerebellar symptoms. Sensation intact.  Psych: Awake and oriented X 3, normal affect, Insight and Judgment appropriate.   EKG: WNL, no ST changes AORTA SCAN: defer  Vicie Mutters 10:24 AM [facility]

## 2019-01-23 ENCOUNTER — Ambulatory Visit (INDEPENDENT_AMBULATORY_CARE_PROVIDER_SITE_OTHER): Payer: BC Managed Care – PPO | Admitting: Physician Assistant

## 2019-01-23 ENCOUNTER — Other Ambulatory Visit: Payer: Self-pay

## 2019-01-23 ENCOUNTER — Encounter: Payer: Self-pay | Admitting: Physician Assistant

## 2019-01-23 VITALS — BP 148/84 | HR 74 | Temp 96.1°F | Ht 69.5 in | Wt 211.6 lb

## 2019-01-23 DIAGNOSIS — I10 Essential (primary) hypertension: Secondary | ICD-10-CM

## 2019-01-23 DIAGNOSIS — I671 Cerebral aneurysm, nonruptured: Secondary | ICD-10-CM

## 2019-01-23 DIAGNOSIS — Z136 Encounter for screening for cardiovascular disorders: Secondary | ICD-10-CM

## 2019-01-23 DIAGNOSIS — Z79899 Other long term (current) drug therapy: Secondary | ICD-10-CM | POA: Diagnosis not present

## 2019-01-23 DIAGNOSIS — R569 Unspecified convulsions: Secondary | ICD-10-CM

## 2019-01-23 DIAGNOSIS — Z131 Encounter for screening for diabetes mellitus: Secondary | ICD-10-CM

## 2019-01-23 DIAGNOSIS — M109 Gout, unspecified: Secondary | ICD-10-CM

## 2019-01-23 DIAGNOSIS — R7309 Other abnormal glucose: Secondary | ICD-10-CM

## 2019-01-23 DIAGNOSIS — I5032 Chronic diastolic (congestive) heart failure: Secondary | ICD-10-CM | POA: Diagnosis not present

## 2019-01-23 DIAGNOSIS — Z1389 Encounter for screening for other disorder: Secondary | ICD-10-CM | POA: Diagnosis not present

## 2019-01-23 DIAGNOSIS — E559 Vitamin D deficiency, unspecified: Secondary | ICD-10-CM

## 2019-01-23 DIAGNOSIS — Z13220 Encounter for screening for lipoid disorders: Secondary | ICD-10-CM | POA: Diagnosis not present

## 2019-01-23 DIAGNOSIS — G4733 Obstructive sleep apnea (adult) (pediatric): Secondary | ICD-10-CM

## 2019-01-23 DIAGNOSIS — E785 Hyperlipidemia, unspecified: Secondary | ICD-10-CM

## 2019-01-23 DIAGNOSIS — Z1329 Encounter for screening for other suspected endocrine disorder: Secondary | ICD-10-CM | POA: Diagnosis not present

## 2019-01-23 DIAGNOSIS — K219 Gastro-esophageal reflux disease without esophagitis: Secondary | ICD-10-CM

## 2019-01-23 DIAGNOSIS — Z0000 Encounter for general adult medical examination without abnormal findings: Secondary | ICD-10-CM | POA: Diagnosis not present

## 2019-01-23 DIAGNOSIS — Z9989 Dependence on other enabling machines and devices: Secondary | ICD-10-CM

## 2019-01-23 DIAGNOSIS — I340 Nonrheumatic mitral (valve) insufficiency: Secondary | ICD-10-CM

## 2019-01-23 DIAGNOSIS — Z130 Encounter for screening for diseases of the blood and blood-forming organs and certain disorders involving the immune mechanism: Secondary | ICD-10-CM

## 2019-01-23 DIAGNOSIS — Z0001 Encounter for general adult medical examination with abnormal findings: Secondary | ICD-10-CM

## 2019-01-23 NOTE — Patient Instructions (Signed)
HYPERTENSION INFORMATION  Monitor your blood pressure at home, please keep a record and bring that in with you to your next office visit.   Go to the ER if any CP, SOB, nausea, dizziness, severe HA, changes vision/speech  Testing/Procedures: HOW TO TAKE YOUR BLOOD PRESSURE:  Rest 5 minutes before taking your blood pressure.  Don't smoke or drink caffeinated beverages for at least 30 minutes before.  Take your blood pressure before (not after) you eat.  Sit comfortably with your back supported and both feet on the floor (don't cross your legs).  Elevate your arm to heart level on a table or a desk.  Use the proper sized cuff. It should fit smoothly and snugly around your bare upper arm. There should be enough room to slip a fingertip under the cuff. The bottom edge of the cuff should be 1 inch above the crease of the elbow.  Due to a recent study, SPRINT, we have changed our goal for the systolic or top blood pressure number. Ideally we want your top number at 120.  In the [facility] Trial, 5000 people were randomized to a goal BP of 120 and 5000 people were randomized to a goal BP of less than 140. The patients with the goal BP at 120 had LESS DEMENTIA, LESS HEART ATTACKS, AND LESS STROKES, AS WELL AS OVERALL DECREASED MORTALITY OR DEATH RATE.   There was another study that showed taking your blood pressure medications at night decrease cardiovascular events.  However if you are on a fluid pill, please take this in the morning.   If you are willing, our goal BP is the top number of 120.  Your most recent BP: BP: (!) 148/84   Take your medications faithfully as instructed. Maintain a healthy weight. Get at least 150 minutes of aerobic exercise per week. Minimize salt intake. Minimize alcohol intake  DASH Eating Plan DASH stands for "Dietary Approaches to Stop Hypertension." The DASH eating plan is a healthy eating plan that has been shown to reduce high blood pressure (hypertension).  Additional health benefits may include reducing the risk of type 2 diabetes mellitus, heart disease, and stroke. The DASH eating plan may also help with weight loss. WHAT DO I NEED TO KNOW ABOUT THE DASH EATING PLAN? For the DASH eating plan, you will follow these general guidelines:  Choose foods with a percent daily value for sodium of less than 5% (as listed on the food label).  Use salt-free seasonings or herbs instead of table salt or sea salt.  Check with your health care provider or pharmacist before using salt substitutes.  Eat lower-sodium products, often labeled as "lower sodium" or "no salt added."  Eat fresh foods.  Eat more vegetables, fruits, and low-fat dairy products.  Choose whole grains. Look for the word "whole" as the first word in the ingredient list.  Choose fish and skinless chicken or [place] more often than red meat. Limit fish, poultry, and meat to 6 oz (170 g) each day.  Limit sweets, desserts, sugars, and sugary drinks.  Choose heart-healthy fats.  Limit cheese to 1 oz (28 g) per day.  Eat more home-cooked food and less restaurant, buffet, and fast food.  Limit fried foods.  Cook foods using methods other than frying.  Limit canned vegetables. If you do use them, rinse them well to decrease the sodium.  When eating at a restaurant, ask that your food be prepared with less salt, or no salt if possible. WHAT FOODS  Whole grain or whole wheat pasta. Quinoa, bulgur, and whole grain cereals. Low-sodium cereals. Corn or whole wheat flour tortillas. Whole grain cornbread. Whole grain crackers. Low-sodium crackers. Vegetables Fresh or frozen vegetables (raw, steamed, roasted, or grilled). Low-sodium or reduced-sodium tomato and vegetable juices. Low-sodium or reduced-sodium tomato sauce and paste. Low-sodium or reduced-sodium canned vegetables.  Fruits All fresh, canned  (in natural juice), or frozen fruits. Meat and Other Protein Products Ground beef (85% or leaner), grass-fed beef, or beef trimmed of fat. Skinless chicken or turkey. Ground chicken or turkey. Pork trimmed of fat. All fish and seafood. Eggs. Dried beans, peas, or lentils. Unsalted nuts and seeds. Unsalted canned beans. Dairy Low-fat dairy products, such as skim or 1% milk, 2% or reduced-fat cheeses, low-fat ricotta or cottage cheese, or plain low-fat yogurt. Low-sodium or reduced-sodium cheeses. Fats and Oils Tub margarines without trans fats. Light or reduced-fat mayonnaise and salad dressings (reduced sodium). Avocado. Safflower, olive, or canola oils. Natural peanut or almond butter. Other Unsalted popcorn and pretzels. The items listed above may not be a complete list of recommended foods or beverages. Contact your dietitian for more options. WHAT FOODS ARE NOT RECOMMENDED? Grains White bread. White pasta. White rice. Refined cornbread. Bagels and croissants. Crackers that contain trans fat. Vegetables Creamed or fried vegetables. Vegetables in a cheese sauce. Regular canned vegetables. Regular canned tomato sauce and paste. Regular tomato and vegetable juices. Fruits Dried fruits. Canned fruit in light or heavy syrup. Fruit juice. Meat and Other Protein Products Fatty cuts of meat. Ribs, chicken wings, bacon, sausage, bologna, salami, chitterlings, fatback, hot dogs, bratwurst, and packaged luncheon meats. Salted nuts and seeds. Canned beans with salt. Dairy Whole or 2% milk, cream, half-and-half, and cream cheese. Whole-fat or sweetened yogurt. Full-fat cheeses or blue cheese. Nondairy creamers and whipped toppings. Processed cheese, cheese spreads, or cheese curds. Condiments Onion and garlic salt, seasoned salt, table salt, and sea salt. Canned and packaged gravies. Worcestershire sauce. Tartar sauce. Barbecue sauce. Teriyaki sauce. Soy sauce, including reduced sodium. Steak sauce. Fish  sauce. Oyster sauce. Cocktail sauce. Horseradish. Ketchup and mustard. Meat flavorings and tenderizers. Bouillon cubes. Hot sauce. Tabasco sauce. Marinades. Taco seasonings. Relishes. Fats and Oils Butter, stick margarine, lard, shortening, ghee, and bacon fat. Coconut, palm kernel, or palm oils. Regular salad dressings. Other Pickles and olives. Salted popcorn and pretzels. The items listed above may not be a complete list of foods and beverages to avoid. Contact your dietitian for more information. WHERE CAN I FIND MORE INFORMATION? National Heart, Lung, and Blood Institute: www.nhlbi.nih.gov/health/health-topics/topics/dash/ Document Released: 12/29/2010 Document Revised: 05/26/2013 Document Reviewed: 11/13/2012 ExitCare Patient Information 2015 [employer]. This information is not intended to replace advice given to you by your health care provider. Make sure you discuss any questions you have with your health care provider.    

## 2019-01-24 ENCOUNTER — Other Ambulatory Visit: Payer: Self-pay | Admitting: Physician Assistant

## 2019-01-24 DIAGNOSIS — R1032 Left lower quadrant pain: Secondary | ICD-10-CM

## 2019-01-25 DIAGNOSIS — G4733 Obstructive sleep apnea (adult) (pediatric): Secondary | ICD-10-CM | POA: Diagnosis not present

## 2019-01-28 LAB — MICROALBUMIN / CREATININE URINE RATIO
Creatinine, Urine: 127 mg/dL (ref 20–275)
Microalb Creat Ratio: 94 mcg/mg creat — ABNORMAL HIGH (ref <30)
Microalb, Ur: 11.9 mg/dL

## 2019-01-28 LAB — CBC WITH DIFFERENTIAL/PLATELET
Absolute Monocytes: 396 cells/uL (ref 200–950)
Basophils Absolute: 50 cells/uL (ref 0–200)
Basophils Relative: 0.7 %
Eosinophils Absolute: 230 cells/uL (ref 15–500)
Eosinophils Relative: 3.2 %
HCT: 33.7 % — ABNORMAL LOW (ref 35.0–45.0)
Hemoglobin: 10.6 g/dL — ABNORMAL LOW (ref 11.7–15.5)
Lymphs Abs: 2498 cells/uL (ref 850–3900)
MCH: 26.4 pg — ABNORMAL LOW (ref 27.0–33.0)
MCHC: 31.5 g/dL — ABNORMAL LOW (ref 32.0–36.0)
MCV: 83.8 fL (ref 80.0–100.0)
MPV: 9.9 fL (ref 7.5–12.5)
Monocytes Relative: 5.5 %
Neutro Abs: 4025 cells/uL (ref 1500–7800)
Neutrophils Relative %: 55.9 %
Platelets: 290 10*3/uL (ref 140–400)
RBC: 4.02 10*6/uL (ref 3.80–5.10)
RDW: 14.4 % (ref 11.0–15.0)
Total Lymphocyte: 34.7 %
WBC: 7.2 10*3/uL (ref 3.8–10.8)

## 2019-01-28 LAB — COMPLETE METABOLIC PANEL WITH GFR
AG Ratio: 1.3 (calc) (ref 1.0–2.5)
ALT: 6 U/L (ref 6–29)
AST: 11 U/L (ref 10–35)
Albumin: 4.5 g/dL (ref 3.6–5.1)
Alkaline phosphatase (APISO): 67 U/L (ref 37–153)
BUN/Creatinine Ratio: 24 (calc) — ABNORMAL HIGH (ref 6–22)
BUN: 32 mg/dL — ABNORMAL HIGH (ref 7–25)
CO2: 27 mmol/L (ref 20–32)
Calcium: 10.3 mg/dL (ref 8.6–10.4)
Chloride: 104 mmol/L (ref 98–110)
Creat: 1.36 mg/dL — ABNORMAL HIGH (ref 0.50–0.99)
GFR, Est African American: 48 mL/min/{1.73_m2} — ABNORMAL LOW (ref >=60)
GFR, Est Non African American: 41 mL/min/{1.73_m2} — ABNORMAL LOW (ref >=60)
Globulin: 3.6 g/dL (calc) (ref 1.9–3.7)
Glucose, Bld: 86 mg/dL (ref 65–99)
Potassium: 4.5 mmol/L (ref 3.5–5.3)
Sodium: 138 mmol/L (ref 135–146)
Total Bilirubin: 0.2 mg/dL (ref 0.2–1.2)
Total Protein: 8.1 g/dL (ref 6.1–8.1)

## 2019-01-28 LAB — URINALYSIS, ROUTINE W REFLEX MICROSCOPIC
Bacteria, UA: NONE SEEN /HPF
Bilirubin Urine: NEGATIVE
Glucose, UA: NEGATIVE
Hgb urine dipstick: NEGATIVE
Hyaline Cast: NONE SEEN /LPF
Ketones, ur: NEGATIVE
Leukocytes,Ua: NEGATIVE
Nitrite: NEGATIVE
RBC / HPF: NONE SEEN /HPF (ref 0–2)
Specific Gravity, Urine: 1.014 (ref 1.001–1.03)
Squamous Epithelial / HPF: NONE SEEN /HPF (ref <=5)
WBC, UA: NONE SEEN /HPF (ref 0–5)
pH: <=5 (ref 5.0–8.0)

## 2019-01-28 LAB — LIPID PANEL
Cholesterol: 210 mg/dL — ABNORMAL HIGH (ref <200)
HDL: 112 mg/dL (ref >=50)
LDL Cholesterol (Calc): 79 mg/dL (calc)
Non-HDL Cholesterol (Calc): 98 mg/dL (calc) (ref <130)
Total CHOL/HDL Ratio: 1.9 (calc) (ref <5.0)
Triglycerides: 111 mg/dL (ref <150)

## 2019-01-28 LAB — IRON, TOTAL/TOTAL IRON BINDING CAP
%SAT: 14 % (calc) — ABNORMAL LOW (ref 16–45)
Iron: 53 ug/dL (ref 45–160)
TIBC: 371 mcg/dL (calc) (ref 250–450)

## 2019-01-28 LAB — HEMOGLOBIN A1C
Hgb A1c MFr Bld: 5.4 % of total Hgb (ref <5.7)
Mean Plasma Glucose: 108 (calc)
eAG (mmol/L): 6 (calc)

## 2019-01-28 LAB — VITAMIN D 25 HYDROXY (VIT D DEFICIENCY, FRACTURES): Vit D, 25-Hydroxy: 36 ng/mL (ref 30–100)

## 2019-01-28 LAB — URIC ACID: Uric Acid, Serum: 9 mg/dL — ABNORMAL HIGH (ref 2.5–7.0)

## 2019-01-28 LAB — LEVETIRACETAM LEVEL: Keppra (Levetiracetam): 31.5 ug/mL (ref 12.0–46.0)

## 2019-01-28 LAB — VITAMIN B12: Vitamin B-12: >2000 pg/mL — ABNORMAL HIGH (ref 200–1100)

## 2019-01-28 LAB — TSH: TSH: 1.58 mIU/L (ref 0.40–4.50)

## 2019-01-28 LAB — MAGNESIUM: Magnesium: 1.7 mg/dL (ref 1.5–2.5)

## 2019-01-28 MED ORDER — ALLOPURINOL 100 MG PO TABS: 100.0000 mg | ORAL_TABLET | Freq: Every day | ORAL | 1 refills | Status: DC

## 2019-01-28 NOTE — Addendum Note (Signed)
Addended by: Vicie Mutters R on: 01/28/2019 01:05 PM   Modules accepted: Orders

## 2019-02-18 ENCOUNTER — Other Ambulatory Visit (HOSPITAL_COMMUNITY): Payer: Self-pay | Admitting: Interventional Radiology

## 2019-02-18 ENCOUNTER — Telehealth (HOSPITAL_COMMUNITY): Payer: Self-pay

## 2019-02-18 DIAGNOSIS — I671 Cerebral aneurysm, nonruptured: Secondary | ICD-10-CM

## 2019-02-18 NOTE — Telephone Encounter (Signed)
Called to schedule f/u mri/mra, no answer, left vm. AW  

## 2019-02-19 ENCOUNTER — Other Ambulatory Visit: Payer: Self-pay | Admitting: Adult Health

## 2019-02-19 ENCOUNTER — Other Ambulatory Visit: Payer: Self-pay | Admitting: Internal Medicine

## 2019-03-05 ENCOUNTER — Ambulatory Visit (HOSPITAL_COMMUNITY)
Admission: RE | Admit: 2019-03-05 | Discharge: 2019-03-05 | Disposition: A | Discharge status: Home / Self Care | Payer: BC Managed Care – PPO | Source: Ambulatory Visit | Attending: Interventional Radiology | Admitting: Interventional Radiology

## 2019-03-05 DIAGNOSIS — I671 Cerebral aneurysm, nonruptured: Secondary | ICD-10-CM | POA: Diagnosis not present

## 2019-03-06 ENCOUNTER — Telehealth (HOSPITAL_COMMUNITY): Payer: Self-pay

## 2019-03-06 NOTE — Telephone Encounter (Signed)
Left message for pt to f/u in 1 year. Call with any questions. AW

## 2019-03-10 ENCOUNTER — Telehealth (HOSPITAL_COMMUNITY): Payer: Self-pay

## 2019-03-10 NOTE — Telephone Encounter (Signed)
Pt agreed to f/u in 1 year with mri/mra. AW  

## 2019-03-11 ENCOUNTER — Other Ambulatory Visit (HOSPITAL_COMMUNITY): Payer: Self-pay | Admitting: Interventional Radiology

## 2019-03-12 ENCOUNTER — Ambulatory Visit (HOSPITAL_COMMUNITY): Payer: BC Managed Care – PPO

## 2019-03-12 ENCOUNTER — Encounter (HOSPITAL_COMMUNITY): Payer: Self-pay

## 2019-03-25 ENCOUNTER — Other Ambulatory Visit: Payer: Self-pay | Admitting: Adult Health

## 2019-04-10 DIAGNOSIS — Z1231 Encounter for screening mammogram for malignant neoplasm of breast: Secondary | ICD-10-CM | POA: Diagnosis not present

## 2019-04-10 LAB — HM MAMMOGRAPHY

## 2019-04-18 ENCOUNTER — Other Ambulatory Visit: Payer: Self-pay | Admitting: Internal Medicine

## 2019-04-18 DIAGNOSIS — M545 Low back pain, unspecified: Secondary | ICD-10-CM

## 2019-05-08 ENCOUNTER — Other Ambulatory Visit: Payer: Self-pay | Admitting: Internal Medicine

## 2019-05-08 MED ORDER — ESTROGENS CONJUGATED 0.9 MG PO TABS: ORAL_TABLET | ORAL | 1 refills | Status: DC

## 2019-05-28 NOTE — Progress Notes (Deleted)
6 month OV  Assessment and Plan: Essential hypertension - continue medications, DASH diet, exercise and monitor at home. Call if greater than 130/80.  - CBC with Differential/Platelet - BASIC METABOLIC PANEL WITH GFR - Hepatic function panel - TSH  Hyperlipidemia -continue medications, check lipids, decrease fatty foods, increase activity. - Lipid panel  Obesity Obesity with co morbidities- long discussion about weight loss, diet, and exercise  Chronic diastolic heart failure decrease salt/sugar, monitor weight, BP stable.   Vitamin D deficiency - Vit D  25 hydroxy (rtn osteoporosis monitoring)  Medication management - Magnesium  OSA on CPAP  weight loss advised.  Very important with heart/CVA history to GET on CPAP Follow up to get different mask refitted  CVA with right shoulder pain/weakness Follow up with PT  Myalgias B12 normal, check iron/magnesium Continue water Increase exercise tylenol is okay to take as needed  Discussed med's effects and SE's. Screening labs and tests as requested with regular follow-up as recommended. Over 30 minutes of exam, counseling, chart review, and complex, high level critical decision making was performed this visit.  Future Appointments  Date Time Provider [place]  05/29/2019 10:30 AM Vicie Mutters, PA-C [facility] None  01/26/2020 10:00 AM Vicie Mutters, PA-C [facility] None    HPI  64 y.o. AA female  presents for follow up OV for PVD, CVA, HTN, chol.   Her blood pressure has not been checked at home, she states last time checked was before the surgery at home, she states then it was 120's, she takes all of he meds at night, she is on lasix 40 AS NEEDED AND DOES NOT TAKE OFTEN, norvasc 10mg , benciar 40mg , today their BP is  .   She is on her CPAP but states it makes her cough, has air coming out the side of the mask- informed to call her supplier.  Also complains of bilateral leg cramping occ., not on  magnesium  She does workout, walks daily for 30 mins. She denies chest pain, shortness of breath, dizziness.   BMI is There is no height or weight on file to calculate BMI., she is working on diet and exercise. Wt Readings from Last 3 Encounters:  01/23/19 211 lb 9.6 oz (96 kg)  11/11/18 223 lb (101.2 kg)  09/05/18 214 lb (97.1 kg)     She was seen for left ICA aneursym for a pipeline stent but had a seizure and AMS after her procedure, she is on brilinta, following with Dr. Corena Pilgrim, she is still on Keppra. She states she is doing well with it now. She continues to have right arm pain/weakness and states lately she has been having decreased grip in right hand, dropping things at work. Needs to make an appointment with Dr. Letta Pate.   She works at [employer], going to work 2 more years and retire next year. 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   She is not on cholesterol medication and denies myalgias. Her cholesterol is at goal. The cholesterol last visit was:   Lab Results  Component Value Date   CHOL 210 (H) 01/23/2019   HDL 112 01/23/2019   LDLCALC 79 01/23/2019   TRIG 111 01/23/2019   CHOLHDL 1.9 01/23/2019   She has been working on diet and exercise for prediabetes, and denies paresthesia of the feet, polydipsia, polyuria and visual disturbances. Last A1C in  the office was:  Lab Results  Component Value Date   HGBA1C 5.4 01/23/2019   Patient is NOT on allopurinol for gout, she is on colchicine and does not report a recent flare.  Lab Results  Component Value Date   LABURIC 9.0 (H) 01/23/2019   Patient is on Vitamin D supplement, takes 2 pills a day, unknown dose but thinks it is 5000.    Lab Results  Component Value Date   VD25OH 36 01/23/2019      Current Medications:  Current Outpatient Medications on File Prior to Visit  Medication  Sig Dispense Refill  . allopurinol (ZYLOPRIM) 100 MG tablet Take 1 tablet (100 mg total) by mouth daily. 90 tablet 1  . amLODipine (NORVASC) 10 MG tablet Take 1 tablet Daily for BP 90 tablet 0  . aspirin EC 81 MG tablet Take 1 tablet (81 mg total) by mouth daily. (Patient taking differently: Take 81 mg by mouth at bedtime. )    . Calcium Carb-Cholecalciferol (CALCIUM 600 + D PO) Take 1 tablet by mouth daily.    . cholecalciferol (VITAMIN D3) 25 MCG (1000 UT) tablet Take 1,000 Units by mouth daily.    . cyclobenzaprine (FLEXERIL) 10 MG tablet Take 1 tablet at Bedtime for Muscle Spasms 90 tablet 0  . diclofenac (VOLTAREN) 75 MG EC tablet Take 1 tablet 2 x /day with Food for Pain & Inflammation 180 tablet 0  . diclofenac sodium (VOLTAREN) 1 % GEL Apply 2 g topically 4 (four) times daily. Right wrist and Right elbow (Patient taking differently: Apply 2 g topically 4 (four) times daily as needed (pain). Right wrist and Right elbow) 3 Tube 2  . estrogens, conjugated, (PREMARIN) 0.9 MG tablet Take 1 tablet Daily for Hormones 90 tablet 1  . ferrous sulfate 325 (65 FE) MG tablet Take 325 mg by mouth daily with breakfast.    . furosemide (LASIX) 40 MG tablet Take 1 tablet Daily for BP, Fluid Retention & Ankle Swelling 90 tablet 1  . gabapentin (NEURONTIN) 300 MG capsule TAKE 1 CAPSULE BY MOUTH THREE TIMES DAILY 90 capsule 0  . levETIRAcetam (KEPPRA) 1000 MG tablet Take 1 tablet 2 x /day to Prevent Seizures 180 tablet 0  . Multiple Vitamins-Minerals (MULTIVITAMIN WITH MINERALS) tablet Take 1 tablet by mouth daily.    Marland Kitchen olmesartan (BENICAR) 40 MG tablet Take 1 tablet Daily for BP 90 tablet 0  . Omega-3 Fatty Acids (FISH OIL) 1000 MG CAPS Take 1,000 mg by mouth daily.    . pantoprazole (PROTONIX) 40 MG tablet Take 1 tablet Daily for Indigestion & Heartburn 90 tablet 3  . ticagrelor (BRILINTA) 90 MG TABS tablet Take 1 tablet 2 x /day to Prevent Blood Clots 180 tablet 1  . traMADol (ULTRAM) 50 MG tablet 1 tablet  as needed up to 3 x a daily for severe pain 15 tablet 0  . vitamin B-12 (CYANOCOBALAMIN) 500 MCG tablet Take 1,000 mcg by mouth daily.     No current facility-administered medications on file prior to visit.    Medical History:  Past Medical History:  Diagnosis Date  . Anemia   . Arthritis   . Chronic diastolic heart failure ([place])   . GERD (gastroesophageal reflux disease)   . Heart murmur   . Hyperlipidemia   . Hypertension may, 1973  . Mitral regurgitation    a. Echo 01/2012 mild LVH, EF 55-65%, Gr 2 DD, mod MR, mild LAE, PASP 36;  b. Echo (02/2013):  EF 55-60%,  Gr 1 DD, mild to mod MR, mild LAE (no sig change since prior echo) // c. Echo 10/18: EF 55-60, normal wall motion, grade 2 diastolic dysfunction, trivial AI, mild to moderate MR, mild LAE, trivial PI, PASP 38    . OSA on CPAP    7 yrs  . Peripheral vascular disease ([place])    cerebral aneursym  . Seizures ([place]) 01/2017  . Stroke [facility]) 07   no weakness jan 29th 2019 no defecits  . Subarachnoid hemorrhage due to ruptured aneurysm ([place])    2003 - s/p coiling  . Unspecified vitamin D deficiency    Allergies Allergies  Allergen Reactions  . Ace Inhibitors Nausea And Vomiting  . Aspirin Nausea And Vomiting    Can take coated Aspirin   Surgical History: reviewed and unchanged Family History: reviewed and unchanged Social History: reviewed and unchanged  Review of Systems: Review of Systems  Constitutional: Negative.   HENT: Negative.   Respiratory: Negative for cough, hemoptysis, sputum production, shortness of breath and wheezing.   Cardiovascular: Negative.  Negative for chest pain, palpitations and leg swelling (resolved).  Gastrointestinal: Negative for abdominal pain, blood in stool, constipation, diarrhea, heartburn, melena, nausea and vomiting.  Genitourinary: Negative.   Musculoskeletal: Negative for back pain, falls, joint pain, myalgias and neck pain.  Skin: Negative.   Neurological: Negative.  Negative for  dizziness and headaches.  Psychiatric/Behavioral: Negative.  Negative for depression, hallucinations, memory loss, substance abuse and suicidal ideas. The patient is not nervous/anxious and does not have insomnia.     Physical Exam: Estimated body mass index is 30.8 kg/m as calculated from the following:   Height as of 01/23/19: 5' 9.5" (1.765 m).   Weight as of 01/23/19: 211 lb 9.6 oz (96 kg). There were no vitals taken for this visit. General Appearance: Well nourished, in no apparent distress.  Eyes: PERRLA, EOMs, conjunctiva no swelling or erythema, normal fundi and vessels.  Sinuses: No Frontal/maxillary tenderness  ENT/Mouth: Ext aud canals clear, normal light reflex with TMs without erythema, bulging. Good dentition. No erythema, swelling, or exudate on post pharynx. Tonsils not swollen or erythematous. Hearing normal.  Neck: Supple, thyroid slightly enlarged. No bruits  Respiratory: Respiratory effort normal, BS equal bilaterally without rales, rhonchi, wheezing or stridor.  Cardio: RRR with holosystolic murmur RSB, without rubs or gallops. Brisk peripheral pulses with minimal edema.  Chest: symmetric, with normal excursions and percussion.  Abdomen: Soft, nontender, no guarding, rebound, hernias, masses, or organomegaly.  Lymphatics: Non tender without lymphadenopathy.  Musculoskeletal: Full ROM all peripheral extremities,5/5 strength, and normal gait.  Skin: medial bilateral ankles with dark nontender rash.  Warm, dry without rashes, lesions, ecchymosis. Neuro: Cranial nerves intact, reflexes equal bilaterally. Normal muscle tone, no cerebellar symptoms. Sensation intact.  Psych: Awake and oriented X 3, normal affect, Insight and Judgment appropriate.    Vicie Mutters 1:52 PM [facility]

## 2019-05-29 ENCOUNTER — Ambulatory Visit: Payer: BC Managed Care – PPO | Admitting: Physician Assistant

## 2019-06-16 ENCOUNTER — Other Ambulatory Visit: Payer: Self-pay | Admitting: Internal Medicine

## 2019-06-25 ENCOUNTER — Ambulatory Visit: Payer: BC Managed Care – PPO | Admitting: Adult Health

## 2019-06-26 IMAGING — XA IR ANGIO/EXISTING CATHETER
1 series · 10 of 24 positions shown · IV contrast (IODINE)
Comparison: Catheter diagnostic arteriogram of 09/04/2016, and 3D
rotational arteriogram performed on 09/04/2016.

CLINICAL DATA: Recurring headaches. History of multiple
intracranial aneurysms.

EXAM:
TRANSCATHETER THERAPY EMBOLIZATION; IR ANGIO INTRA EXTRACRAN SEL
INTERNAL CAROTID UNI LEFT MOD SED; ARTERIOGRAPHY; IR NEURO EACH
ADD'L AFTER BASIC UNI LEFT (MS)
TECHNIQUE: Informed written consent was obtained from the patient after a
thorough discussion of the procedural risks, benefits and
alternatives. All questions were addressed. Maximal Sterile Barrier
Technique was utilized including caps, mask, sterile gowns, sterile
gloves, sterile drape, hand hygiene and skin antiseptic. A timeout
was performed prior to the initiation of the procedure.

[Series 300: dr. (person_name) · 10 of 142 slices shown]
[im 7/142]
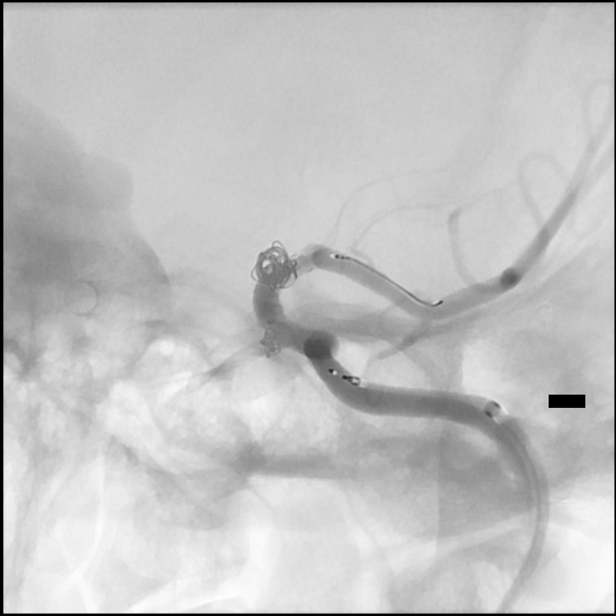
[im 19/142]
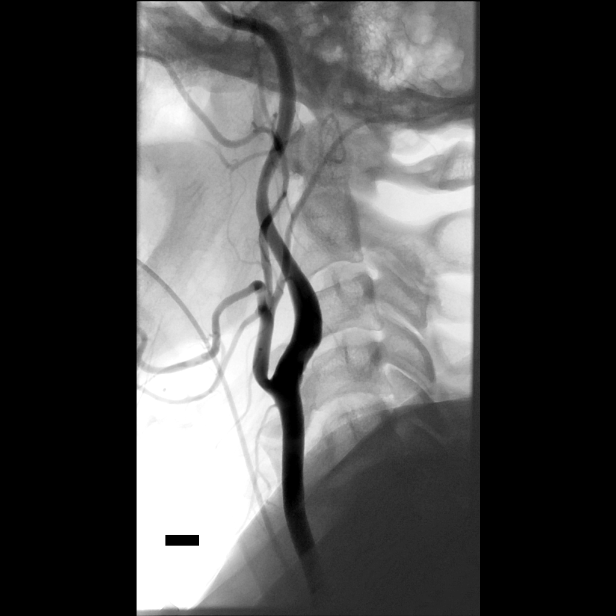
[im 37/142]
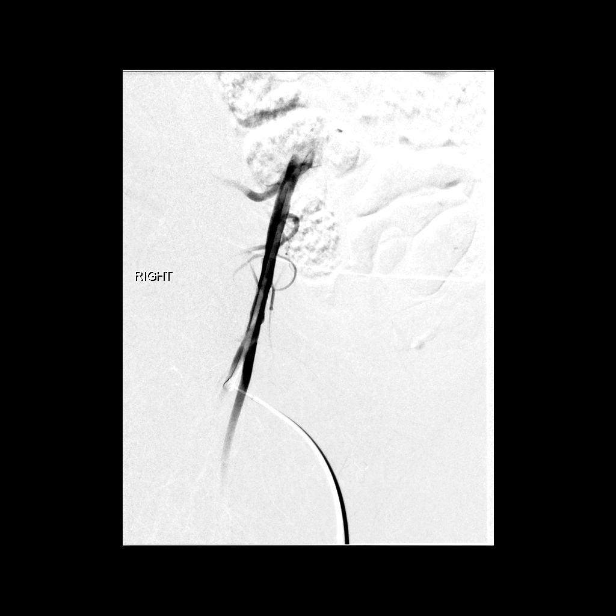
[im 50/142]
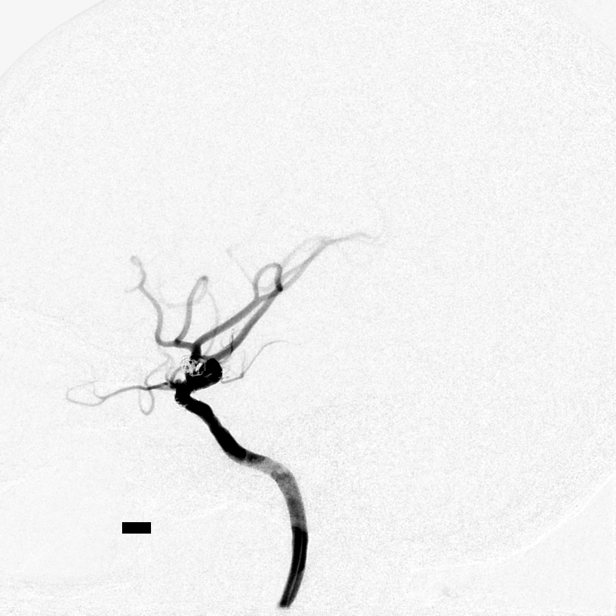
[im 62/142]
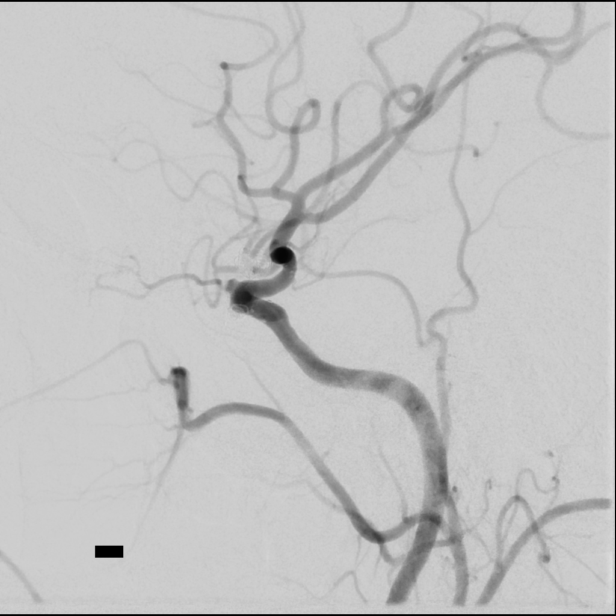
[im 80/142]
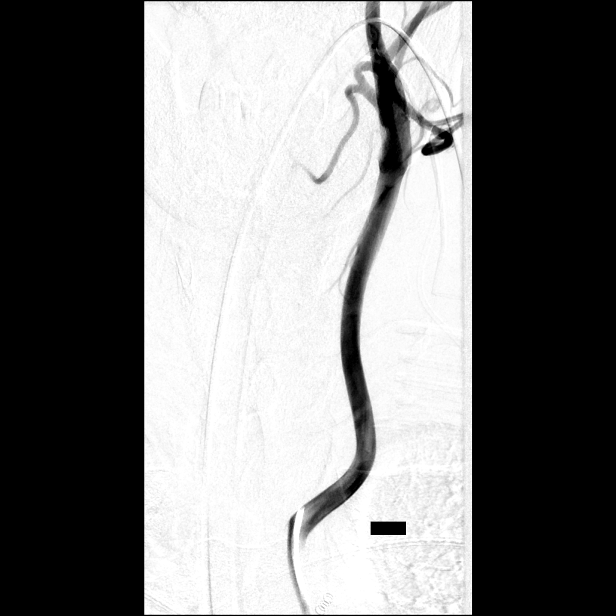
[im 92/142]
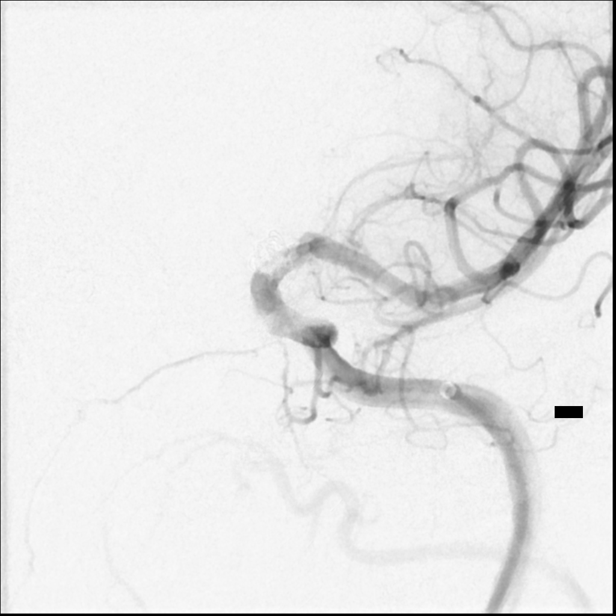
[im 111/142]
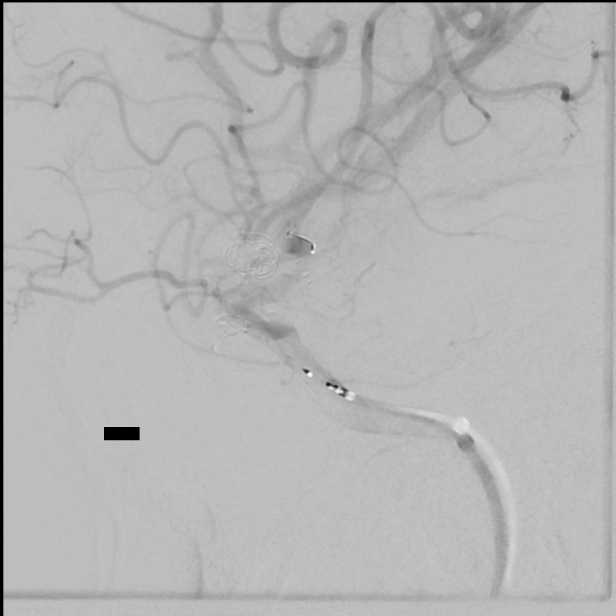
[im 123/142]
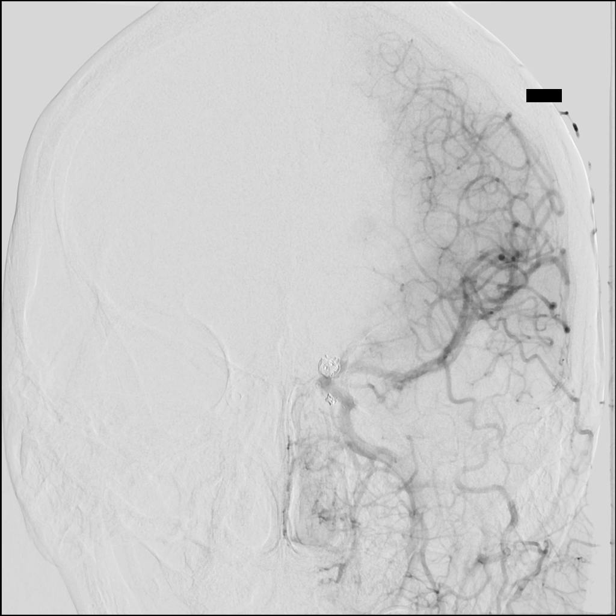
[im 135/142]
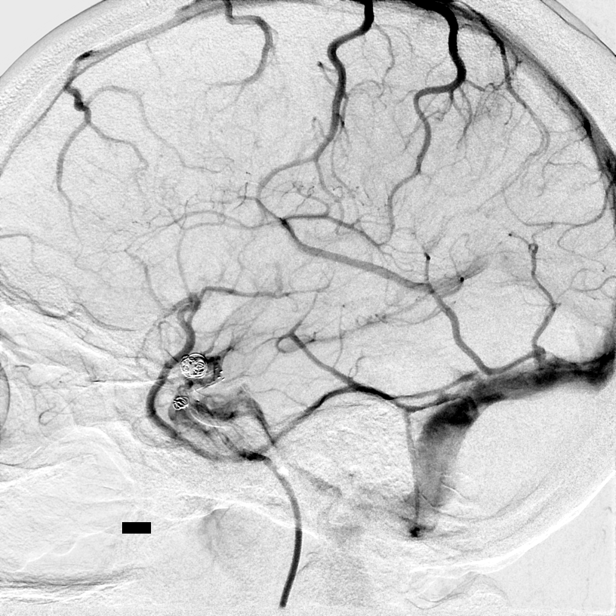

[10 of 24 positions shown; findings below may reference images not displayed]

MEDICATIONS:
Heparin 3,500 units IV; Ancef the 2 g IV antibiotic was administered
within 1 hour of the procedure.

ANESTHESIA/SEDATION:
General anesthesia.

CONTRAST:  Isovue 300 approximately 60 cc.

FLUOROSCOPY TIME:  Fluoroscopy Time: 29 minutes 40 seconds (7880
mGy).

COMPLICATIONS:
None immediate.
The right groin was prepped and draped in the usual sterile fashion.
Thereafter using modified Seldinger technique, transfemoral access
into the right common femoral artery was obtained without
difficulty. Over a 0.035 inch guidewire, a 5 French Pinnacle sheath
was inserted. Through this, and also over 0.035 inch guidewire, a 5
French JB 1 catheter was advanced to the aortic arch region and
selectively positioned in the left common carotid artery.
FINDINGS: The left common carotid arteriogram demonstrates the left external
carotid artery to be mildly narrowed at its origin. Its branches are
normally opacified.

The left internal carotid artery at the bulb to the cranial skull
base opacifies widely.

Wide patency is seen of the petrous, the cavernous and the
supraclinoid segments. The left middle cerebral artery opacifies
normally into the capillary and venous phases.

There continues to be occlusion of the left anterior cerebral A1
segment which is chronic.

High, resolution magnified images in obliquity of the left internal
carotid artery cavernous segment and supraclinoid segments again
demonstrates the presence of a previously positioned stent across
the parapelvic region aneurysms. Again demonstrated are the 2
lobulated aneurysms arising in the left paraophthalmic region.

On the previous 3D rotational arteriogram these measured
approximately 2.6 x 2.5 mm, and 3 mm x 1.3 mm.

Measurements were also performed of the left internal carotid artery
supraclinoid segment, and also the proximal cavernous segment.

ENDOVASCULAR TREATMENT OF 2 PARAOPHTHALMIC REGION ANEURYSMS USING
THE PIPELINE FLOW DIVERTER DEVICE.

The diagnostic JB 1 catheter in the left common carotid artery was
exchanged over a 0.035 inch 300 cm Rosen exchange guidewire for a 6
French 80 cm Abimelk Tiger using biplane roadmap technique and
constant fluoroscopic guidance. Good aspiration obtained from the
hub of the Abimelk Tiger. Shkeizia gentle contrast injection
demonstrated no evidence of spasms, dissections or of intraluminal
filling defects.

Over a 0.035 inch Roadrunner guidewire, a 6 Renzo Couture
Allisson was then advanced to the mid left ICA. Again the guidewire
was removed. A gentle contrast injection through the 6 French
catheter revealed no evidence of spasms, dissections or of
intraluminal filling defects. This was then connected to continuous
heparinized saline infusion.

Through the Alvi, Shirish 115 cm 5 French Catalyst guide
catheter was then advanced to the horizontal segment of the petrous
segment of the left internal carotid artery over a 0.035 inch
Roadrunner guidewire. Again the guidewire was removed. Good
aspiration obtained from the hub of the 5 French Catalyst guide
catheter.

A control arteriogram performed over the left anterior circulation
demonstrated no change in the intracranial circulation or of the
aneurysms.

At this time, a Phenom 027 microcatheter was then advanced over a
0.014 inch Softip Synchro guidewire to the distal end of the 5
French guide catheter.

With the micro guidewire leading with a J-tip configuration the
combination was navigated with a torque device passed the previously
positioned stent in the supraclinoid left ICA and into the left
middle cerebral artery M2 M3 region followed by the microcatheter.
The guidewire was removed. Good aspiration obtained from the hub of
the microcatheter. A gentle contrast injection demonstrated safe
distal exit of contrast. The microcatheter hub was then connected to
continuous heparinized saline infusion.

Having considered the anatomy of the left internal carotid artery
paraophthalmic region, it was decided to use a 3.5 mm x 14 mm
pipeline flex flow diverter device to endovascularly treat the 2
paraophthalmic region aneurysms.

The device was then purged with heparinized saline infusion.

This was then advanced using biplane roadmap technique and constant
fluoroscopic guidance, in a coaxial manner and with constant
heparinized saline infusion to the distal end of the microcatheter.
The O ring on the delivery microcatheter was then loosened. The
entire system was then straightened.

With slight forward gentle traction with the right hand on the
delivery micro guidewire, with the left hand the distal wire on the
device, and the distal portion of the device was then deployed by
unsheathing the device.

The combination was then slightly retrieved more proximally to the
left M1 segment. At this time the distal portion of the device
opened completely.

This combination was then retrieved until the open device was now
just at the level of the left anterior choroidal artery.

Thereafter, using the advancing of the micro guidewire technique to
deploy the device, and the fluffing technique to insure apposition
with the parent vessel, with the tip of the microcatheter being
maintained centrally the device was then unsheathed without
difficulty. A control arteriogram performed after deployment
revealed excellent apposition proximally and distally with slow flow
through the 2 intracranial aneurysms in the parapelvic region.

The microcatheter was then gently advanced without difficulty
through the device into the left MCA and the wire was then captured.
This combination was then retrieved and removed. Control
arteriograms were then performed through the 5 French Catalyst guide
catheter which demonstrated no evidence of intra device
irregularities or clots or distally.

Nevertheless approximately 1.5 mg of Integrilin was infused super
selectively through the 5 French guide catheter in the petrous
portion of the left internal carotid artery to ensure no platelet
aggregation was noted in this region.

At the end of 30 minutes post deployment of the device, a final
control arteriogram performed through the 5 French Catalyst guide
catheter demonstrated excellent apposition with no evidence of
endoleak. The device remained stable covering the neck portion. No
evidence of intraluminal filling defects, or intra stent filling
defects were seen.

The 5 French Catalyst guide catheter and the 6 Renzo Couture
Allisson were then retrieved into the abdominal aorta and exchanged
over a J-tip guidewire for a 6 French Pinnacle sheath. This in turn
was successfully removed with the application of an Angio-Seal
closure device. At the end of the procedure, the patient's right
groin appeared soft without evidence of hematoma or bleeding. The
distal pulses remained palpable in the dorsalis pedis, and posterior
tibial regions unchanged from prior to the beginning of the
procedure.

Throughout the procedure, the patient's ACT was maintained in the
region of 200 seconds approximately. Neurologically the patient
remained stable.

The patient's general anesthesia was then reversed. The patient was
extubated without difficulty. Upon recovery the patient demonstrated
no new neurological signs or symptoms. She did not complain of any
visual symptoms, any nausea or vomiting symptoms either.

The patient was then transferred to the neuro ICU to continue on
low-dose IV heparin, close neurologic observations, and to control
her blood pressure.
IMPRESSION: Status post endovascular treatment of irregular lobulated 2
paraophthalmic region aneurysms using the pipeline flow diverter
device as described above.

PLAN:
Patient will return in follow-up to the clinic in approximately 2-4
weeks following her discharge.

## 2019-07-16 ENCOUNTER — Other Ambulatory Visit: Payer: Self-pay | Admitting: Internal Medicine

## 2019-07-16 DIAGNOSIS — M545 Low back pain, unspecified: Secondary | ICD-10-CM

## 2019-07-17 NOTE — Progress Notes (Signed)
6 month OV  Assessment and Plan: Essential hypertension - continue medications, DASH diet, exercise and monitor at home. Call if greater than 130/80.  - CBC with Differential/Platelet - BASIC METABOLIC PANEL WITH GFR - Hepatic function panel - TSH  Hyperlipidemia -continue medications, check lipids, decrease fatty foods, increase activity. - Lipid panel  Obesity Obesity with co morbidities- long discussion about weight loss, diet, and exercise  Chronic diastolic heart failure decrease salt/sugar, monitor weight, BP stable.   Vitamin D deficiency - Vit D  25 hydroxy (rtn osteoporosis monitoring)  Medication management - Magnesium  OSA on CPAP  weight loss advised.  Very important with heart/CVA history to continue on CPAP She is being seen today as follow-up to being placed on CPAP therapy.  Patient states using the CPAP and feeling better since starting therapy.  The plan is to continue the CPAP therapy since the patient is using and benefiting from the therapy.  Rash under bilateral breast Nystatin cream and powder sent in for patient  Discussed med's effects and SE's. Screening labs and tests as requested with regular follow-up as recommended. Over 30 minutes of exam, counseling, chart review, and complex, high level critical decision making was performed this visit.  Future Appointments  Date Time Provider [place]  08/04/2019 10:20 AM Nahser, Wonda Cheng, MD [facility]  01/26/2020 10:00 AM Vicie Mutters, PA-C [facility] None    HPI  64 y.o. AA female  presents for follow up OV for PVD, CVA, HTN, chol.   Her blood pressure has not been checked at home, she states last time checked was before the surgery at home, she states then it was 120's, she takes all of he meds at night, she is on lasix 40 AS NEEDED AND DOES NOT TAKE OFTEN, norvasc 10mg , benciar 40mg , today their BP is BP: 136/90.   She does workout, walks daily for 30 mins. She denies chest  pain, shortness of breath, dizziness.   BMI is Body mass index is 28.82 kg/m., she is working on diet and exercise. She has OSA and is on CPAP. Uses [facility]   PAP pressure setting is 15 cm H20, per sleepstudy from 05/[id number]. She needs supplies. She is being seen today as follow-up to being placed on CPAP therapy.  Patient states using the CPAP and feeling better since starting therapy.  The plan is to continue the CPAP therapy since the patient is using and benefiting from the therapy. Wt Readings from Last 3 Encounters:  07/18/19 198 lb (89.8 kg)  01/23/19 211 lb 9.6 oz (96 kg)  11/11/18 223 lb (101.2 kg)    She was seen for left ICA aneursym for a pipeline stent but had a seizure and AMS after her procedure, she is on brilinta, following with Dr. Corena Pilgrim, she is still on Keppra. She states she is doing well with it now. She continues to have right arm pain/weakness and states lately she has been having decreased grip in right hand, dropping things at work. Needs to make an appointment with Dr. Letta Pate.   She works at [employer], going to work part time- 2-3 days a week and retire early. 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   She is not on cholesterol medication and denies myalgias. Her cholesterol is at goal. The cholesterol last visit was:   Lab  Results  Component Value Date   CHOL 210 (H) 01/23/2019   HDL 112 01/23/2019   LDLCALC 79 01/23/2019   TRIG 111 01/23/2019   CHOLHDL 1.9 01/23/2019   She has been working on diet and exercise for prediabetes, and denies paresthesia of the feet, polydipsia, polyuria and visual disturbances. Last A1C in the office was:  Lab Results  Component Value Date   HGBA1C 5.4 01/23/2019   Patient is NOT on allopurinol for gout, she is on colchicine and does not report a recent flare.  Lab  Results  Component Value Date   LABURIC 9.0 (H) 01/23/2019   Patient is on Vitamin D supplement, takes 2 pills a day, unknown dose but thinks it is 5000.    Lab Results  Component Value Date   VD25OH 36 01/23/2019      Current Medications:   Current Outpatient Medications (Endocrine & Metabolic):  .  estrogens, conjugated, (PREMARIN) 0.9 MG tablet, Take 1 tablet Daily for Hormones  Current Outpatient Medications (Cardiovascular):  .  amLODipine (NORVASC) 10 MG tablet, TAKE 1 TABLET DAILY FOR BLOOD PRESSURE .  furosemide (LASIX) 40 MG tablet, Take 1 tablet Daily for BP, Fluid Retention & Ankle Swelling .  olmesartan (BENICAR) 40 MG tablet, Take 1 tablet Daily for BP   Current Outpatient Medications (Analgesics):  .  allopurinol (ZYLOPRIM) 100 MG tablet, Take 1 tablet (100 mg total) by mouth daily. Marland Kitchen  aspirin EC 81 MG tablet, Take 1 tablet (81 mg total) by mouth daily. (Patient taking differently: Take 81 mg by mouth at bedtime. ) .  diclofenac (VOLTAREN) 75 MG EC tablet, Take 1 tablet 2 x /day with Food for Pain & Inflammation .  traMADol (ULTRAM) 50 MG tablet, 1 tablet as needed up to 3 x a daily for severe pain  Current Outpatient Medications (Hematological):  .  ferrous sulfate 325 (65 FE) MG tablet, Take 325 mg by mouth daily with breakfast. .  ticagrelor (BRILINTA) 90 MG TABS tablet, Take 1 tablet 2 x /day to Prevent Blood Clots .  vitamin B-12 (CYANOCOBALAMIN) 500 MCG tablet, Take 1,000 mcg by mouth daily.  Current Outpatient Medications (Other):  Marland Kitchen  Calcium Carb-Cholecalciferol (CALCIUM 600 + D PO), Take 1 tablet by mouth daily. .  cholecalciferol (VITAMIN D3) 25 MCG (1000 UT) tablet, Take 1,000 Units by mouth daily. .  cyclobenzaprine (FLEXERIL) 10 MG tablet, TAKE 1 TABLET BY MOUTH AT BEDTIME FOR MUSCLE SPASMS .  diclofenac sodium (VOLTAREN) 1 % GEL, Apply 2 g topically 4 (four) times daily. Right wrist and Right elbow (Patient taking differently: Apply 2 g topically 4  (four) times daily as needed (pain). Right wrist and Right elbow) .  gabapentin (NEURONTIN) 300 MG capsule, TAKE 1 CAPSULE BY MOUTH THREE TIMES DAILY .  levETIRAcetam (KEPPRA) 1000 MG tablet, TAKE 1 TABLET BY MOUTH TWICE DAILY TO PREVENT SEIZURES .  Multiple Vitamins-Minerals (MULTIVITAMIN WITH MINERALS) tablet, Take 1 tablet by mouth daily. .  Omega-3 Fatty Acids (FISH OIL) 1000 MG CAPS, Take 1,000 mg by mouth daily. .  pantoprazole (PROTONIX) 40 MG tablet, Take 1 tablet Daily for Indigestion & Heartburn  Medical History:  Past Medical History:  Diagnosis Date  . Anemia   . Arthritis   . Chronic diastolic heart failure ([place])   . GERD (gastroesophageal reflux disease)   . Heart murmur   . Hyperlipidemia   . Hypertension may, 1973  . Mitral regurgitation    a. Echo 01/2012 mild LVH, EF 55-65%, Gr  2 DD, mod MR, mild LAE, PASP 36;  b. Echo (02/2013):  EF 55-60%, Gr 1 DD, mild to mod MR, mild LAE (no sig change since prior echo) // c. Echo 10/18: EF 55-60, normal wall motion, grade 2 diastolic dysfunction, trivial AI, mild to moderate MR, mild LAE, trivial PI, PASP 38    . OSA on CPAP    7 yrs  . Peripheral vascular disease ([place])    cerebral aneursym  . Seizures ([place]) 01/2017  . Stroke [facility]) 07   no weakness jan 29th 2019 no defecits  . Subarachnoid hemorrhage due to ruptured aneurysm ([place])    2003 - s/p coiling  . Unspecified vitamin D deficiency    Allergies Allergies  Allergen Reactions  . Ace Inhibitors Nausea And Vomiting  . Aspirin Nausea And Vomiting    Can take coated Aspirin   Surgical History: reviewed and unchanged Family History: reviewed and unchanged Social History: reviewed and unchanged  Review of Systems: Review of Systems  Constitutional: Negative.   HENT: Negative.   Respiratory: Negative for cough, hemoptysis, sputum production, shortness of breath and wheezing.   Cardiovascular: Negative.  Negative for chest pain, palpitations and leg swelling (resolved).   Gastrointestinal: Negative for abdominal pain, blood in stool, constipation, diarrhea, heartburn, melena, nausea and vomiting.  Genitourinary: Negative.   Musculoskeletal: Negative for back pain, falls, joint pain, myalgias and neck pain.  Skin: Negative.   Neurological: Negative.  Negative for dizziness and headaches.  Psychiatric/Behavioral: Negative.  Negative for depression, hallucinations, memory loss, substance abuse and suicidal ideas. The patient is not nervous/anxious and does not have insomnia.     Physical Exam: Estimated body mass index is 28.82 kg/m as calculated from the following:   Height as of 01/23/19: 5' 9.5" (1.765 m).   Weight as of this encounter: 198 lb (89.8 kg). BP 136/90   Pulse 78   Temp (!) 97.5 F (36.4 C)   Wt 198 lb (89.8 kg)   SpO2 98%   BMI 28.82 kg/m  General Appearance: Well nourished, in no apparent distress.  Eyes: PERRLA, EOMs, conjunctiva no swelling or erythema, normal fundi and vessels.  Sinuses: No Frontal/maxillary tenderness  ENT/Mouth: Ext aud canals clear, normal light reflex with TMs without erythema, bulging. Good dentition. No erythema, swelling, or exudate on post pharynx. Tonsils not swollen or erythematous. Hearing normal.  Neck: Supple, thyroid slightly enlarged. No bruits  Respiratory: Respiratory effort normal, BS equal bilaterally without rales, rhonchi, wheezing or stridor.  Cardio: RRR with holosystolic murmur RSB, without rubs or gallops. Brisk peripheral pulses with minimal edema.  Chest: symmetric, with normal excursions and percussion.  Abdomen: Soft, nontender, no guarding, rebound, hernias, masses, or organomegaly.  Lymphatics: Non tender without lymphadenopathy.  Musculoskeletal: Full ROM all peripheral extremities,5/5 strength, and normal gait.  Skin: medial bilateral ankles with dark nontender rash.  Warm, dry without rashes, lesions, ecchymosis. Neuro: Cranial nerves intact, reflexes equal bilaterally. Normal  muscle tone, no cerebellar symptoms. Sensation intact.  Psych: Awake and oriented X 3, normal affect, Insight and Judgment appropriate.    Vicie Mutters 10:57 AM [facility]

## 2019-07-18 ENCOUNTER — Encounter: Payer: Self-pay | Admitting: Physician Assistant

## 2019-07-18 ENCOUNTER — Ambulatory Visit (INDEPENDENT_AMBULATORY_CARE_PROVIDER_SITE_OTHER): Payer: BC Managed Care – PPO | Admitting: Physician Assistant

## 2019-07-18 ENCOUNTER — Other Ambulatory Visit: Payer: Self-pay

## 2019-07-18 VITALS — BP 136/90 | HR 78 | Temp 97.5°F | Wt 198.0 lb

## 2019-07-18 DIAGNOSIS — Z9989 Dependence on other enabling machines and devices: Secondary | ICD-10-CM

## 2019-07-18 DIAGNOSIS — E559 Vitamin D deficiency, unspecified: Secondary | ICD-10-CM

## 2019-07-18 DIAGNOSIS — E785 Hyperlipidemia, unspecified: Secondary | ICD-10-CM | POA: Diagnosis not present

## 2019-07-18 DIAGNOSIS — I5032 Chronic diastolic (congestive) heart failure: Secondary | ICD-10-CM | POA: Diagnosis not present

## 2019-07-18 DIAGNOSIS — I10 Essential (primary) hypertension: Secondary | ICD-10-CM | POA: Diagnosis not present

## 2019-07-18 DIAGNOSIS — Z79899 Other long term (current) drug therapy: Secondary | ICD-10-CM | POA: Diagnosis not present

## 2019-07-18 DIAGNOSIS — B372 Candidiasis of skin and nail: Secondary | ICD-10-CM

## 2019-07-18 DIAGNOSIS — R569 Unspecified convulsions: Secondary | ICD-10-CM

## 2019-07-18 DIAGNOSIS — R7309 Other abnormal glucose: Secondary | ICD-10-CM | POA: Diagnosis not present

## 2019-07-18 DIAGNOSIS — G4733 Obstructive sleep apnea (adult) (pediatric): Secondary | ICD-10-CM

## 2019-07-18 MED ORDER — NYSTATIN 100000 UNIT/GM EX CREA: 1.0000 "application " | TOPICAL_CREAM | Freq: Two times a day (BID) | CUTANEOUS | 1 refills | Status: DC

## 2019-07-18 MED ORDER — NYSTATIN 100000 UNIT/GM EX POWD: CUTANEOUS | 0 refills | Status: DC

## 2019-07-18 NOTE — Patient Instructions (Signed)
YEAST RASH UNDER BREAST  Use the nystatin cream twice a day to treat it Can use the nystatin powder for prevention after the shower Make sure towel dry well and use the powder Can put in old makeup compact or container to make it easier to use.    Skin Yeast Infection Skin yeast infection is a condition in which there is an overgrowth of yeast (candida) that normally lives on the skin. This condition usually occurs in areas of the skin that are constantly warm and moist, such as the armpits or the groin. What are the causes? This condition is caused by a change in the normal balance of the yeast and bacteria that live on the skin. What increases the risk? This condition is more likely to develop in:  People who are obese.  Pregnant women.  Women who take birth control pills.  People who have diabetes.  People who take antibiotic medicines.  People who take steroid medicines.  People who are malnourished.  People who have a weak defense (immune) system.  People who are 51 years of age or older.  What are the signs or symptoms? Symptoms of this condition include:  A red, swollen area of the skin.  Bumps on the skin.  Itchiness.  How is this diagnosed? This condition is diagnosed with a medical history and physical exam. Your health care provider may check for yeast by taking light scrapings of the skin to be viewed under a microscope. How is this treated? This condition is treated with medicine. Medicines may be prescribed or be available over-the-counter. The medicines may be:  Taken by mouth (orally).  Applied as a cream.  Follow these instructions at home:  Take or apply over-the-counter and prescription medicines only as told by your health care provider.  Eat more yogurt. This may help to keep your yeast infection from returning.  Maintain a healthy weight. If you need help losing weight, talk with your health care provider.  Keep your skin clean and  dry.  If you have diabetes, keep your blood sugar under control. Contact a health care provider if:  Your symptoms go away and then return.  Your symptoms do not get better with treatment.  Your symptoms get worse.  Your rash spreads.  You have a fever or chills.  You have new symptoms.  You have new warmth or redness of your skin. This information is not intended to replace advice given to you by your health care provider. Make sure you discuss any questions you have with your health care provider. Document Released: 09/27/2010 Document Revised: 09/05/2015 Document Reviewed: 07/13/2014 Elsevier Interactive Patient Education  [employer].

## 2019-07-19 LAB — CBC WITH DIFFERENTIAL/PLATELET
Absolute Monocytes: 410 cells/uL (ref 200–950)
Basophils Absolute: 51 cells/uL (ref 0–200)
Basophils Relative: 0.8 %
Eosinophils Absolute: 442 cells/uL (ref 15–500)
Eosinophils Relative: 6.9 %
HCT: 28.4 % — ABNORMAL LOW (ref 35.0–45.0)
Hemoglobin: 9.1 g/dL — ABNORMAL LOW (ref 11.7–15.5)
Lymphs Abs: 2650 cells/uL (ref 850–3900)
MCH: 27.4 pg (ref 27.0–33.0)
MCHC: 32 g/dL (ref 32.0–36.0)
MCV: 85.5 fL (ref 80.0–100.0)
MPV: 10.3 fL (ref 7.5–12.5)
Monocytes Relative: 6.4 %
Neutro Abs: 2848 cells/uL (ref 1500–7800)
Neutrophils Relative %: 44.5 %
Platelets: 289 10*3/uL (ref 140–400)
RBC: 3.32 10*6/uL — ABNORMAL LOW (ref 3.80–5.10)
RDW: 15.2 % — ABNORMAL HIGH (ref 11.0–15.0)
Total Lymphocyte: 41.4 %
WBC: 6.4 10*3/uL (ref 3.8–10.8)

## 2019-07-19 LAB — COMPLETE METABOLIC PANEL WITH GFR
AG Ratio: 1.3 (calc) (ref 1.0–2.5)
ALT: 6 U/L (ref 6–29)
AST: 11 U/L (ref 10–35)
Albumin: 3.9 g/dL (ref 3.6–5.1)
Alkaline phosphatase (APISO): 53 U/L (ref 37–153)
BUN/Creatinine Ratio: 17 (calc) (ref 6–22)
BUN: 21 mg/dL (ref 7–25)
CO2: 27 mmol/L (ref 20–32)
Calcium: 9.4 mg/dL (ref 8.6–10.4)
Chloride: 105 mmol/L (ref 98–110)
Creat: 1.21 mg/dL — ABNORMAL HIGH (ref 0.50–0.99)
GFR, Est African American: 55 mL/min/{1.73_m2} — ABNORMAL LOW (ref >=60)
GFR, Est Non African American: 48 mL/min/{1.73_m2} — ABNORMAL LOW (ref >=60)
Globulin: 3 g/dL (calc) (ref 1.9–3.7)
Glucose, Bld: 75 mg/dL (ref 65–99)
Potassium: 5.1 mmol/L (ref 3.5–5.3)
Sodium: 138 mmol/L (ref 135–146)
Total Bilirubin: 0.3 mg/dL (ref 0.2–1.2)
Total Protein: 6.9 g/dL (ref 6.1–8.1)

## 2019-07-19 LAB — LIPID PANEL
Cholesterol: 165 mg/dL (ref <200)
HDL: 108 mg/dL (ref >=50)
LDL Cholesterol (Calc): 43 mg/dL (calc)
Non-HDL Cholesterol (Calc): 57 mg/dL (calc) (ref <130)
Total CHOL/HDL Ratio: 1.5 (calc) (ref <5.0)
Triglycerides: 61 mg/dL (ref <150)

## 2019-07-19 LAB — TSH: TSH: 3 mIU/L (ref 0.40–4.50)

## 2019-07-19 LAB — VITAMIN D 25 HYDROXY (VIT D DEFICIENCY, FRACTURES): Vit D, 25-Hydroxy: 24 ng/mL — ABNORMAL LOW (ref 30–100)

## 2019-07-19 LAB — MAGNESIUM: Magnesium: 1.3 mg/dL — ABNORMAL LOW (ref 1.5–2.5)

## 2019-07-19 LAB — HEMOGLOBIN A1C
Hgb A1c MFr Bld: 5.1 % of total Hgb (ref <5.7)
Mean Plasma Glucose: 100 (calc)
eAG (mmol/L): 5.5 (calc)

## 2019-08-03 ENCOUNTER — Encounter: Payer: Self-pay | Admitting: Cardiovascular Disease

## 2019-08-03 NOTE — Progress Notes (Signed)
Cardiology Office Note   Date:  08/04/2019   ID:  KHAMARI YOUSUF, DOB 1955/08/12, MRN [id number]  PCP:  Vicie Mutters, PA-C  Cardiologist:   Mertie Moores, MD   Chief Complaint  Patient presents with  . Hypertension   1. hypertension 2. Heart murmur 3. Cerebral aneurysm  History of Present Illness:  Nocole is a 64 year old female with a history of hypertension and a cerebral aneurysm. She was to have a heart murmur and was referred here for further evaluation. She also has been having some shortness of breath. She exercises for 30 minutes every day typically does not have much shortness breath when she is exercising. She seems to have shortness of breath at other times. She denies any syncope or presyncope. She denies any chest pain.  She works at [employer]. She does also helps the seniors with activities.  Hx of blood pressure on occasion. Her blood pressure readings have been normal.   Feb. 17, 2016:   KERIANNE GURR is a 64 y.o. female who presents for follow up of her HTN She is dong well.  She has had the cerebral coils removed and had another procedure. Headaches have resolved. BP is well controlled.  Getting some exercise.  Still working at [employer].  Feb. 16, 2017: Doing well.  BP has been well controlled.   Has lost some weight.   Exercising   April 07, 2016:    Tamella is seen back.   Has had some leg swelling  ( is on Amlodipine )  Some dyspnea.   August 04, 2019:  Adelene is seen back today for follow up of her HTN Had a cerebral aneurism in Jan.   Associated with a CVA and seizures.  Doing well from a cardiac standpoint   Past Medical History:  Diagnosis Date  . Anemia   . Arthritis   . Chronic diastolic heart failure ([place])   . GERD (gastroesophageal reflux disease)   . Heart murmur   . Hyperlipidemia   . Hypertension may, 1973  . Mitral regurgitation    a. Echo 01/2012 mild LVH, EF 55-65%, Gr 2 DD, mod MR, mild  LAE, PASP 36;  b. Echo (02/2013):  EF 55-60%, Gr 1 DD, mild to mod MR, mild LAE (no sig change since prior echo) // c. Echo 10/18: EF 55-60, normal wall motion, grade 2 diastolic dysfunction, trivial AI, mild to moderate MR, mild LAE, trivial PI, PASP 38    . OSA on CPAP    7 yrs  . Peripheral vascular disease ([place])    cerebral aneursym  . Seizures ([place]) 01/2017  . Stroke [facility] 07   no weakness jan 29th 2019 no defecits  . Subarachnoid hemorrhage due to ruptured aneurysm ([place])    2003 - s/p coiling  . Unspecified vitamin D deficiency     Past Surgical History:  Procedure Laterality Date  . ABDOMINAL HYSTERECTOMY     complete  . ANEURYSM COILING    . COLONOSCOPY WITH PROPOFOL N/A 09/05/2018   Procedure: COLONOSCOPY WITH PROPOFOL;  Surgeon: Juanita Craver, MD;  Location: [facility] ENDOSCOPY;  Service: Endoscopy;  Laterality: N/A;  . ESOPHAGOGASTRODUODENOSCOPY (EGD) WITH PROPOFOL N/A 09/05/2018   Procedure: ESOPHAGOGASTRODUODENOSCOPY (EGD) WITH PROPOFOL;  Surgeon: Juanita Craver, MD;  Location: [facility] ENDOSCOPY;  Service: Endoscopy;  Laterality: N/A;  . GIVENS CAPSULE STUDY N/A 10/17/2018   Procedure: GIVENS CAPSULE STUDY;  Surgeon: Juanita Craver, MD;  Location: [facility] ENDOSCOPY;  Service: Endoscopy;  Laterality: N/A;  . head surgery     aneurysm ruptured- stroke  . IR 3D INDEPENDENT [facility]ST  09/04/2016  . IR ANGIO INTRA EXTRACRAN SEL COM CAROTID INNOMINATE BILAT MOD SED  09/04/2016  . IR ANGIO INTRA EXTRACRAN SEL INTERNAL CAROTID UNI L MOD SED  02/21/2017  . IR ANGIO VERTEBRAL SEL VERTEBRAL BILAT MOD SED  09/04/2016  . IR ANGIOGRAM EXTREMITY LEFT  09/04/2016  . IR ANGIOGRAM FOLLOW UP STUDY  02/21/2017  . IR NEURO EACH ADD'L AFTER BASIC UNI LEFT (MS)  02/21/2017  . IR RADIOLOGIST EVAL & MGMT  10/11/2016  . IR RADIOLOGIST EVAL & MGMT  03/07/2017  . IR TRANSCATH/EMBOLIZ  02/21/2017  . RADIOLOGY WITH ANESTHESIA N/A 04/23/2013   Procedure: RADIOLOGY WITH ANESTHESIA;  Surgeon: Rob Hickman, MD;  Location: [place];   Service: Radiology;  Laterality: N/A;  . RADIOLOGY WITH ANESTHESIA N/A 10/30/2016   Procedure: EMBOLIZATION;  Surgeon: Luanne Bras, MD;  Location: [place];  Service: Radiology;  Laterality: N/A;  . RADIOLOGY WITH ANESTHESIA N/A 02/21/2017   Procedure: EMBOLIZATION;  Surgeon: Luanne Bras, MD;  Location: [place];  Service: Radiology;  Laterality: N/A;  . TONSILLECTOMY AND ADENOIDECTOMY    . TUBAL LIGATION       Current Outpatient Medications  Medication Sig Dispense Refill  . allopurinol (ZYLOPRIM) 100 MG tablet Take 1 tablet (100 mg total) by mouth daily. 90 tablet 1  . amLODipine (NORVASC) 10 MG tablet TAKE 1 TABLET DAILY FOR BLOOD PRESSURE 30 tablet 0  . aspirin EC 81 MG tablet Take 1 tablet (81 mg total) by mouth daily.    . Calcium Carb-Cholecalciferol (CALCIUM 600 + D PO) Take 1 tablet by mouth daily.    . cholecalciferol (VITAMIN D3) 25 MCG (1000 UT) tablet Take 1,000 Units by mouth daily.    . cyclobenzaprine (FLEXERIL) 10 MG tablet TAKE 1 TABLET BY MOUTH AT BEDTIME FOR MUSCLE SPASMS 30 tablet 0  . diclofenac (VOLTAREN) 75 MG EC tablet Take 1 tablet 2 x /day with Food for Pain & Inflammation 180 tablet 0  . diclofenac sodium (VOLTAREN) 1 % GEL Apply 2 g topically 4 (four) times daily. Right wrist and Right elbow 3 Tube 2  . estrogens, conjugated, (PREMARIN) 0.9 MG tablet Take 1 tablet Daily for Hormones 90 tablet 1  . ferrous sulfate 325 (65 FE) MG tablet Take 325 mg by mouth daily with breakfast.    . furosemide (LASIX) 40 MG tablet Take 1 tablet Daily for BP, Fluid Retention & Ankle Swelling 90 tablet 1  . gabapentin (NEURONTIN) 300 MG capsule TAKE 1 CAPSULE BY MOUTH THREE TIMES DAILY 90 capsule 0  . levETIRAcetam (KEPPRA) 1000 MG tablet TAKE 1 TABLET BY MOUTH TWICE DAILY TO PREVENT SEIZURES 60 tablet 0  . Multiple Vitamins-Minerals (MULTIVITAMIN WITH MINERALS) tablet Take 1 tablet by mouth daily.    Marland Kitchen nystatin (NYSTATIN) powder APPLY TOPICALLY WITH BRUSH TO AREA AFTER A  SHOWER TO PREVENT INFECTION 30 g 0  . nystatin cream (MYCOSTATIN) Apply 1 application topically 2 (two) times daily. 30 g 1  . olmesartan (BENICAR) 40 MG tablet Take 1 tablet Daily for BP 90 tablet 0  . Omega-3 Fatty Acids (FISH OIL) 1000 MG CAPS Take 1,000 mg by mouth daily.    . pantoprazole (PROTONIX) 40 MG tablet Take 1 tablet Daily for Indigestion & Heartburn 90 tablet 3  . ticagrelor (BRILINTA) 90 MG TABS tablet Take 1 tablet 2 x /day to Prevent Blood Clots 180 tablet  1  . traMADol (ULTRAM) 50 MG tablet 1 tablet as needed up to 3 x a daily for severe pain 15 tablet 0  . vitamin B-12 (CYANOCOBALAMIN) 500 MCG tablet Take 1,000 mcg by mouth daily.     No current facility-administered medications for this visit.    Allergies:   Ace inhibitors and Aspirin    Social History:  The patient  reports that she quit smoking about 20 years ago. Her smoking use included cigarettes. She has a 0.75 pack-year smoking history. She has never used smokeless tobacco. She reports that she does not drink alcohol and does not use drugs.   Family History:  The patient's family history includes Cancer (age of onset: 70) in her sister; Hypertension in her father, sister, sister, and sister; Kidney disease in her father.    ROS:  Please see the history of present illness.  All other systems are negative        Physical Exam: Blood pressure 138/76, pulse 74, height 5\' 10"  (1.778 m), weight 218 lb 12.8 oz (99.2 kg), SpO2 99 %.  GEN:  Well nourished, well developed in no acute distress HEENT: Normal NECK: No JVD;  Left carotid bruit vs radiation of systolic murmur  LYMPHATICS: No lymphadenopathy CARDIAC: RRR  1-9/6 systolic murmur radiating to LSB .   Not so much to axilla  RESPIRATORY:  Clear to auscultation without rales, wheezing or rhonchi  ABDOMEN: Soft, non-tender, non-distended MUSCULOSKELETAL:  No edema; No deformity  SKIN: Warm and dry NEUROLOGIC:  Alert and oriented x 3   EKG:       Recent Labs: 07/18/2019: ALT 6; BUN 21; Creat 1.21; Hemoglobin 9.1; Magnesium 1.3; Platelets 289; Potassium 5.1; Sodium 138; TSH 3.00    Lipid Panel    Component Value Date/Time   CHOL 165 07/18/2019 1107   TRIG 61 07/18/2019 1107   HDL 108 07/18/2019 1107   CHOLHDL 1.5 07/18/2019 1107   VLDL 11 10/26/2015 0925   LDLCALC 43 07/18/2019 1107      Wt Readings from Last 3 Encounters:  08/04/19 218 lb 12.8 oz (99.2 kg)  07/18/19 198 lb (89.8 kg)  01/23/19 211 lb 9.6 oz (96 kg)      Other studies Reviewed: Additional studies/ records that were reviewed today include: . Review of the above records demonstrates:    ASSESSMENT AND PLAN:  1. Hypertension - BP is well controlled.   Cont current meds  2. Mitral regurgitation - last echo was in 2018.  Her murmur sounds louder today than I remember.  Will repeat her echo   3. Cerebral aneurysm-   She had another CVA and seizure in Jan related to expansion of one of her several aneurusms.   She had  A procedure to prevent additional aneurims growth.     Current medicines are reviewed at length with the patient today.  The patient does not have concerns regarding medicines.  The following changes have been made:  no change   Disposition:   FU with Richardson Dopp, PA in 6 months    Signed, Mertie Moores, MD  08/04/2019 10:02 AM    [facility] [place], [place], [place]  [address] Phone: [phone]; Fax: [phone]

## 2019-08-04 ENCOUNTER — Ambulatory Visit: Payer: BC Managed Care – PPO | Admitting: Cardiovascular Disease

## 2019-08-04 ENCOUNTER — Encounter: Payer: Self-pay | Admitting: Cardiovascular Disease

## 2019-08-04 ENCOUNTER — Other Ambulatory Visit: Payer: Self-pay

## 2019-08-04 VITALS — BP 138/76 | HR 74 | Ht 70.0 in | Wt 218.8 lb

## 2019-08-04 DIAGNOSIS — I5032 Chronic diastolic (congestive) heart failure: Secondary | ICD-10-CM | POA: Diagnosis not present

## 2019-08-04 DIAGNOSIS — I10 Essential (primary) hypertension: Secondary | ICD-10-CM

## 2019-08-04 DIAGNOSIS — R011 Cardiac murmur, unspecified: Secondary | ICD-10-CM | POA: Diagnosis not present

## 2019-08-04 DIAGNOSIS — I340 Nonrheumatic mitral (valve) insufficiency: Secondary | ICD-10-CM

## 2019-08-04 NOTE — Patient Instructions (Signed)
Medication Instructions:  Your physician recommends that you continue on your current medications as directed. Please refer to the Current Medication list given to you today.  *If you need a refill on your cardiac medications before your next appointment, please call your pharmacy*   Lab Work: None Ordered If you have labs (blood work) drawn today and your tests are completely normal, you will receive your results only by:  [place] [place] (if you have MyChart) OR  A paper copy in the mail If you have any lab test that is abnormal or we need to change your treatment, we will call you to review the results.   Testing/Procedures: Your physician has requested that you have an echocardiogram. Echocardiography is a painless test that uses sound waves to create images of your heart. It provides your doctor with information about the size and shape of your heart and how well your hearts chambers and valves are working. This procedure takes approximately one hour. There are no restrictions for this procedure.     Follow-Up: At [facility], you and your health needs are our priority.  As part of our continuing mission to provide you with exceptional heart care, we have created designated Provider Care Teams.  These Care Teams include your primary Cardiologist (physician) and Advanced Practice Providers (APPs -  Physician Assistants and Nurse Practitioners) who all work together to provide you with the care you need, when you need it.  We recommend signing up for the patient portal called "MyChart".  Sign up information is provided on this After Visit Summary.  MyChart is used to connect with patients for Virtual Visits (Telemedicine).  Patients are able to view lab/test results, encounter notes, upcoming appointments, etc.  Non-urgent messages can be sent to your provider as well.   To learn more about what you can do with MyChart, go to [email or web address].    Your next appointment:   6  month(s)  The format for your next appointment:   In Person  Provider:   You may see Mertie Moores, MD or one of the following Advanced Practice Providers on your designated Care Team:    Richardson Dopp, PA-C  [place], [place]

## 2019-08-08 ENCOUNTER — Telehealth: Payer: Self-pay | Admitting: Physician Assistant

## 2019-08-08 NOTE — Telephone Encounter (Signed)
CPAP DME order compliance office note faxed to [facility]  [phone] and [phone]

## 2019-08-11 ENCOUNTER — Other Ambulatory Visit: Payer: Self-pay | Admitting: Physician Assistant

## 2019-08-11 DIAGNOSIS — G4733 Obstructive sleep apnea (adult) (pediatric): Secondary | ICD-10-CM

## 2019-08-11 DIAGNOSIS — Z9989 Dependence on other enabling machines and devices: Secondary | ICD-10-CM

## 2019-08-20 ENCOUNTER — Other Ambulatory Visit: Payer: Self-pay

## 2019-08-20 ENCOUNTER — Ambulatory Visit (HOSPITAL_COMMUNITY): Payer: BC Managed Care – PPO | Attending: Cardiology

## 2019-08-20 DIAGNOSIS — R011 Cardiac murmur, unspecified: Secondary | ICD-10-CM | POA: Insufficient documentation

## 2019-08-20 DIAGNOSIS — I5032 Chronic diastolic (congestive) heart failure: Secondary | ICD-10-CM | POA: Diagnosis not present

## 2019-08-20 LAB — ECHOCARDIOGRAM COMPLETE
Area-P 1/2: 2.76 cm2
MV M vel: 5.63 m/s
MV Peak grad: 126.8 mmHg
Radius: 0.7 cm
S' Lateral: 3.5 cm

## 2019-08-21 ENCOUNTER — Telehealth: Payer: Self-pay

## 2019-08-21 DIAGNOSIS — I5189 Other ill-defined heart diseases: Secondary | ICD-10-CM

## 2019-08-21 NOTE — Telephone Encounter (Signed)
-----   Message from Thayer Headings, MD sent at 08/21/2019 11:00 AM EDT ----- Normal LV function . Mod - severe MR  Mass in the RA.  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

## 2019-08-21 NOTE — Telephone Encounter (Signed)
Attempted phone call to pt to discuss test results per Dr Acie Fredrickson.  Left voicemail message to contact Triage at [phone].

## 2019-08-21 NOTE — Telephone Encounter (Signed)
Patient is returning Marsha's call regarding results. Please call patient back at [phone].

## 2019-08-21 NOTE — Telephone Encounter (Signed)
Spoke with pt and advised per Dr Acie Fredrickson.  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 Explained to pt this can be a "ridge"  In the heart that can mimic a mass.   Pt advised she will need to have a cMRI for further evaluation.  Pt verbalizes understanding and agrees with current plan.  Order placed for cMRI as requested.

## 2019-08-22 ENCOUNTER — Telehealth: Payer: Self-pay | Admitting: Cardiovascular Disease

## 2019-08-22 NOTE — Telephone Encounter (Signed)
Left message for patient to call and discuss preferred weekdays and times for  Scheduling the Cardiac MRI ordered by Dr. Acie Fredrickson

## 2019-08-22 NOTE — Telephone Encounter (Signed)
Patient is returning call.  °

## 2019-08-25 NOTE — Telephone Encounter (Signed)
Patient is returning call to discuss scheduling MRI.

## 2019-08-25 NOTE — Telephone Encounter (Signed)
Left message for patient to call and discuss scheduling Cardiac MRI ordered by Dr. Acie Fredrickson

## 2019-08-26 ENCOUNTER — Encounter: Payer: Self-pay | Admitting: Cardiovascular Disease

## 2019-08-26 NOTE — Telephone Encounter (Signed)
Spoke with patient regarding appointment scheduled Thursday 10/02/19 at 12:00pm for the Cardiac MRI ordered by Dr. Acie Fredrickson.  Arrival time is 11:30 am 1st floor admissions office at [facility].  Will mail information to patient and she voiced her understanding.

## 2019-08-26 NOTE — Telephone Encounter (Signed)
Left message for patient to call regarding appointment for Cardiac MRI scheduled Thursday 10/02/19 at 12:L00pm at [facility].

## 2019-09-08 ENCOUNTER — Other Ambulatory Visit: Payer: Self-pay | Admitting: Internal Medicine

## 2019-09-08 DIAGNOSIS — R1032 Left lower quadrant pain: Secondary | ICD-10-CM

## 2019-09-24 ENCOUNTER — Telehealth: Payer: Self-pay | Admitting: Physician Assistant

## 2019-09-24 DIAGNOSIS — R059 Cough, unspecified: Secondary | ICD-10-CM

## 2019-09-24 NOTE — Telephone Encounter (Signed)
Patient called today to request we contact [facility] for alternative cpap treatment. Patient needs alternative to recalled CPAP machine. Patient complains of Coughing mostly at night and seems to be getting worse. [facility] advised patient to have our office advise if she should continue using the defective cpap or consider alternative treatment until new cpap is available.

## 2019-09-30 ENCOUNTER — Ambulatory Visit: Payer: BC Managed Care – PPO | Admitting: Physician Assistant

## 2019-09-30 NOTE — Telephone Encounter (Signed)
With patient's medical history of stroke, HTN, dCHF, aneurysm, seizures I would advise her to stay on the recalled CPAP until she is able to get a new one.  Will send in order for chest xray  INFORMATION ABOUT YOUR XRAY [facility] Can walk into [address] building for an Insurance account manager. They will have the order and take you back. You do not any paper work, I should get the result back today or tomorrow. This order is good for a year.  Can call [phone] to schedule an appointment if you wish.

## 2019-09-30 NOTE — Addendum Note (Signed)
Addended by: Vicie Mutters R on: 09/30/2019 12:56 PM   Modules accepted: Orders

## 2019-10-01 ENCOUNTER — Telehealth (HOSPITAL_COMMUNITY): Payer: Self-pay | Admitting: Emergency Medicine

## 2019-10-01 NOTE — Telephone Encounter (Signed)
Reaching out to patient to offer assistance regarding upcoming cardiac imaging study; pt verbalizes understanding of appt date/time, parking situation and where to check in,, and verified current allergies; name and call back number provided for further questions should they arise Marchia Bond RN Navigator Cardiac Imaging [place] [facility] [phone] office [phone] cell   Pt denies implants, states has claustro but gets brain MRIs annually so knows what to expect and can tolerate test

## 2019-10-02 ENCOUNTER — Other Ambulatory Visit: Payer: Self-pay

## 2019-10-02 ENCOUNTER — Ambulatory Visit (HOSPITAL_COMMUNITY)
Admission: RE | Admit: 2019-10-02 | Discharge: 2019-10-02 | Disposition: A | Discharge status: Home / Self Care | Payer: BC Managed Care – PPO | Source: Ambulatory Visit | Attending: Cardiovascular Disease | Admitting: Cardiovascular Disease

## 2019-10-02 DIAGNOSIS — I5189 Other ill-defined heart diseases: Secondary | ICD-10-CM | POA: Diagnosis not present

## 2019-10-02 MED ORDER — GADOBUTROL 1 MMOL/ML IV SOLN
8.0000 mL | Freq: Once | INTRAVENOUS | Status: AC | PRN
  Administered 2019-10-02: 8 mL via INTRAVENOUS

## 2019-10-07 ENCOUNTER — Telehealth: Payer: Self-pay | Admitting: Cardiovascular Disease

## 2019-10-07 NOTE — Telephone Encounter (Signed)
Patient is reutrning call to discuss MRI results.

## 2019-10-07 NOTE — Telephone Encounter (Signed)
The patient has been notified of the result and verbalized understanding.  All questions (if any) were answered. Michaelyn Barter, RN 10/07/2019 8:48 AM

## 2019-10-23 NOTE — Progress Notes (Signed)
6 month OV  Assessment and Plan: Essential hypertension - continue medications, DASH diet, exercise and monitor at home. Call if greater than 130/80.  - CBC with Differential/Platelet - BASIC METABOLIC PANEL WITH GFR - Hepatic function panel - TSH  Hyperlipidemia -continue medications, check lipids, decrease fatty foods, increase activity. - Lipid panel  Obesity Obesity with co morbidities- long discussion about weight loss, diet, and exercise  Chronic diastolic heart failure decrease salt/sugar, monitor weight, BP stable.   Vitamin D deficiency - Vit D  25 hydroxy (rtn osteoporosis monitoring)  Medication management - Magnesium  OSA on CPAP  weight loss advised.  Very important with heart/CVA history to continue on CPAP  Patient states using the CPAP and feeling better since starting therapy, her machine was recalled and she has not been able to sleep as well, more tired during the day, BP up some, and she has been coughing.   I do not want her off the CPAP for too long due to her history of anyerusm, will see if we can call and try to facilitate the need for one but at this point she will try to treat her allergies for the cough, will sleep on her side and will call [facility] as well.  She has a different machine at home but needs mask and tubing, will try to get that as well.  The plan is to continue the CPAP therapy since the patient is using and benefiting from the therapy.  Discussed med's effects and SE's. Screening labs and tests as requested with regular follow-up as recommended. Over 30 minutes of exam, counseling, chart review, and complex, high level critical decision making was performed this visit.  Future Appointments  Date Time Provider [place]  01/26/2020 11:00 AM Liane Comber, NP [facility] None    HPI  64 y.o. AA female  presents for follow up OV for PVD, CVA, HTN, chol.   Her blood pressure has not been checked at home, she states last  time checked was before the surgery at home, she states then it was 120's, she takes all of he meds at night, she is on lasix 40 AS NEEDED AND DOES NOT TAKE OFTEN, norvasc 10mg , benciar 40mg , today their BP is BP: 140/80.   She does workout, walks daily for 30 mins. She denies chest pain, shortness of breath, dizziness.   BMI is Body mass index is 31.14 kg/m., she is working on diet and exercise. She has OSA and is on CPAP. [facility]ses [facility]   PAP pressure setting is 15 cm H20, per sleepstudy from 05/[id number], ahi WAS 12.2.  Patient states using the CPAP and feeling better since starting therapy, her machine was recalled and she has not been able to sleep as well, more tired during the day, BP up some, and she has been coughing.  I do not want her off the CPAP for too long due to her history of anyerusm, will see if we can call and try to facilitate the need for one but at this point she will try to treat her allergies for the cough, will sleep on her side and will call [facility] as well.   The plan is to continue the CPAP therapy since the patient is using and benefiting from the therapy. Wt Readings from Last 3 Encounters:  10/27/19 217 lb (98.4 kg)  08/04/19 218 lb 12.8 oz (99.2 kg)  07/18/19 198 lb (89.8 kg)    She was seen for left ICA aneursym for a  pipeline stent but had a seizure and AMS after her procedure, she is on brilinta, following with Dr. Corena Pilgrim, she is off keppra.  She continues to have right arm pain/weakness and states lately she has been having decreased grip in right hand, dropping things at work. Needs to make an appointment with Dr. Letta Pate.   She works at [employer], going to work part time- 2-3 days a week and retire early. 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   She is not on cholesterol medication and denies  myalgias. Her cholesterol is at goal. The cholesterol last visit was:   Lab Results  Component Value Date   CHOL 165 07/18/2019   HDL 108 07/18/2019   LDLCALC 43 07/18/2019   TRIG 61 07/18/2019   CHOLHDL 1.5 07/18/2019   She has been working on diet and exercise for prediabetes, and denies paresthesia of the feet, polydipsia, polyuria and visual disturbances. Last A1C in the office was:  Lab Results  Component Value Date   HGBA1C 5.1 07/18/2019   Patient is NOT on allopurinol for gout, she is on colchicine and does not report a recent flare.  Lab Results  Component Value Date   LABURIC 9.0 (H) 01/23/2019   Patient is on Vitamin D supplement, takes 2 pills a day, unknown dose but thinks it is 5000.    Lab Results  Component Value Date   VD25OH 24 (L) 07/18/2019      Current Medications:   Current Outpatient Medications (Endocrine & Metabolic):  .  estrogens, conjugated, (PREMARIN) 0.9 MG tablet, Take 1 tablet Daily for Hormones  Current Outpatient Medications (Cardiovascular):  .  amLODipine (NORVASC) 10 MG tablet, TAKE 1 TABLET DAILY FOR BLOOD PRESSURE .  furosemide (LASIX) 40 MG tablet, Take 1 tablet Daily for BP, Fluid Retention & Ankle Swelling .  olmesartan (BENICAR) 40 MG tablet, Take 1 tablet Daily for BP   Current Outpatient Medications (Analgesics):  .  allopurinol (ZYLOPRIM) 100 MG tablet, Take 1 tablet (100 mg total) by mouth daily. Marland Kitchen  aspirin EC 81 MG tablet, Take 1 tablet (81 mg total) by mouth daily. .  diclofenac (VOLTAREN) 75 MG EC tablet, Take 1 tablet    2 x /day    with Food for Pain & Inflammation .  traMADol (ULTRAM) 50 MG tablet, 1 tablet as needed up to 3 x a daily for severe pain  Current Outpatient Medications (Hematological):  .  ferrous sulfate 325 (65 FE) MG tablet, Take 325 mg by mouth daily with breakfast. .  ticagrelor (BRILINTA) 90 MG TABS tablet, Take 1 tablet 2 x /day to Prevent Blood Clots .  vitamin B-12 (CYANOCOBALAMIN) 500 MCG tablet,  Take 1,000 mcg by mouth daily.  Current Outpatient Medications (Other):  Marland Kitchen  Calcium Carb-Cholecalciferol (CALCIUM 600 + D PO), Take 1 tablet by mouth daily. .  cholecalciferol (VITAMIN D3) 25 MCG (1000 UT) tablet, Take 1,000 Units by mouth daily. .  cyclobenzaprine (FLEXERIL) 10 MG tablet, TAKE 1 TABLET BY MOUTH AT BEDTIME FOR MUSCLE SPASMS .  diclofenac sodium (VOLTAREN) 1 % GEL, Apply 2 g topically 4 (four) times daily. Right wrist and Right elbow .  gabapentin (NEURONTIN) 300 MG capsule, TAKE 1 CAPSULE BY MOUTH THREE TIMES DAILY .  levETIRAcetam (KEPPRA) 1000 MG tablet, TAKE 1 TABLET BY MOUTH TWICE DAILY TO PREVENT  SEIZURES .  Multiple Vitamins-Minerals (MULTIVITAMIN WITH MINERALS) tablet, Take 1 tablet by mouth daily. Marland Kitchen  nystatin (NYSTATIN) powder, APPLY TOPICALLY WITH BRUSH TO AREA AFTER A SHOWER TO PREVENT INFECTION .  nystatin cream (MYCOSTATIN), Apply 1 application topically 2 (two) times daily. .  Omega-3 Fatty Acids (FISH OIL) 1000 MG CAPS, Take 1,000 mg by mouth daily. .  pantoprazole (PROTONIX) 40 MG tablet, Take 1 tablet Daily for Indigestion & Heartburn  Medical History:  Past Medical History:  Diagnosis Date  . Anemia   . Arthritis   . Chronic diastolic heart failure ([place])   . GERD (gastroesophageal reflux disease)   . Heart murmur   . Hyperlipidemia   . Hypertension may, 1973  . Mitral regurgitation    a. Echo 01/2012 mild LVH, EF 55-65%, Gr 2 DD, mod MR, mild LAE, PASP 36;  b. Echo (02/2013):  EF 55-60%, Gr 1 DD, mild to mod MR, mild LAE (no sig change since prior echo) // c. Echo 10/18: EF 55-60, normal wall motion, grade 2 diastolic dysfunction, trivial AI, mild to moderate MR, mild LAE, trivial PI, PASP 38    . OSA on CPAP    7 yrs  . Peripheral vascular disease ([place])    cerebral aneursym  . Seizures ([place]) 01/2017  . Stroke [facility]) 07   no weakness jan 29th 2019 no defecits  . Subarachnoid hemorrhage due to ruptured aneurysm ([place])    2003 - s/p coiling  .  Unspecified vitamin D deficiency    Allergies Allergies  Allergen Reactions  . Ace Inhibitors Nausea And Vomiting  . Aspirin Nausea And Vomiting    Can take coated Aspirin   Surgical History: reviewed and unchanged Family History: reviewed and unchanged Social History: reviewed and unchanged  Review of Systems: Review of Systems  Constitutional: Negative.   HENT: Negative.   Respiratory: Negative for cough, hemoptysis, sputum production, shortness of breath and wheezing.   Cardiovascular: Negative.  Negative for chest pain, palpitations and leg swelling (resolved).  Gastrointestinal: Negative for abdominal pain, blood in stool, constipation, diarrhea, heartburn, melena, nausea and vomiting.  Genitourinary: Negative.   Musculoskeletal: Negative for back pain, falls, joint pain, myalgias and neck pain.  Skin: Negative.   Neurological: Negative.  Negative for dizziness and headaches.  Psychiatric/Behavioral: Negative.  Negative for depression, hallucinations, memory loss, substance abuse and suicidal ideas. The patient is not nervous/anxious and does not have insomnia.     Physical Exam: Estimated body mass index is 31.14 kg/m as calculated from the following:   Height as of 08/04/19: 5\' 10"  (1.778 m).   Weight as of this encounter: 217 lb (98.4 kg). BP 140/80   Pulse 76   Temp 97.6 F (36.4 C)   Wt 217 lb (98.4 kg)   SpO2 99%   BMI 31.14 kg/m  General Appearance: Well nourished, in no apparent distress.  Eyes: PERRLA, EOMs, conjunctiva no swelling or erythema, normal fundi and vessels.  Sinuses: No Frontal/maxillary tenderness  ENT/Mouth: Ext aud canals clear, normal light reflex with TMs without erythema, bulging. Good dentition. No erythema, swelling, or exudate on post pharynx. Tonsils not swollen or erythematous. Hearing normal.  Neck: Supple, thyroid slightly enlarged. No bruits  Respiratory: Respiratory effort normal, BS equal bilaterally without rales, rhonchi,  wheezing or stridor.  Cardio: RRR with holosystolic murmur RSB, without rubs or gallops. Brisk peripheral pulses with minimal edema.  Chest: symmetric, with normal excursions and percussion.  Abdomen: Soft, nontender, no guarding, rebound, hernias, masses, or  organomegaly.  Lymphatics: Non tender without lymphadenopathy.  Musculoskeletal: Full ROM all peripheral extremities,5/5 strength, and normal gait.  Skin: medial bilateral ankles with dark nontender rash.  Warm, dry without rashes, lesions, ecchymosis. Neuro: Cranial nerves intact, reflexes equal bilaterally. Normal muscle tone, no cerebellar symptoms. Sensation intact.  Psych: Awake and oriented X 3, normal affect, Insight and Judgment appropriate.    Vicie Mutters 9:36 AM [facility]

## 2019-10-27 ENCOUNTER — Other Ambulatory Visit: Payer: Self-pay

## 2019-10-27 ENCOUNTER — Encounter: Payer: Self-pay | Admitting: Physician Assistant

## 2019-10-27 ENCOUNTER — Ambulatory Visit (INDEPENDENT_AMBULATORY_CARE_PROVIDER_SITE_OTHER): Payer: BC Managed Care – PPO | Admitting: Physician Assistant

## 2019-10-27 DIAGNOSIS — E785 Hyperlipidemia, unspecified: Secondary | ICD-10-CM

## 2019-10-27 DIAGNOSIS — G4733 Obstructive sleep apnea (adult) (pediatric): Secondary | ICD-10-CM | POA: Diagnosis not present

## 2019-10-27 DIAGNOSIS — I10 Essential (primary) hypertension: Secondary | ICD-10-CM | POA: Diagnosis not present

## 2019-10-27 DIAGNOSIS — Z79899 Other long term (current) drug therapy: Secondary | ICD-10-CM | POA: Diagnosis not present

## 2019-10-27 DIAGNOSIS — E559 Vitamin D deficiency, unspecified: Secondary | ICD-10-CM

## 2019-10-27 DIAGNOSIS — Z23 Encounter for immunization: Secondary | ICD-10-CM

## 2019-10-27 DIAGNOSIS — M109 Gout, unspecified: Secondary | ICD-10-CM | POA: Diagnosis not present

## 2019-10-27 DIAGNOSIS — D649 Anemia, unspecified: Secondary | ICD-10-CM

## 2019-10-27 DIAGNOSIS — R569 Unspecified convulsions: Secondary | ICD-10-CM

## 2019-10-27 DIAGNOSIS — I5032 Chronic diastolic (congestive) heart failure: Secondary | ICD-10-CM

## 2019-10-27 DIAGNOSIS — Z9989 Dependence on other enabling machines and devices: Secondary | ICD-10-CM

## 2019-10-27 DIAGNOSIS — I671 Cerebral aneurysm, nonruptured: Secondary | ICD-10-CM

## 2019-10-27 NOTE — Patient Instructions (Signed)
General eating tips  What to Avoid . Avoid added sugars o Often added sugar can be found in processed foods such as many condiments, dry cereals, cakes, cookies, chips, crisps, crackers, candies, sweetened drinks, etc.  o Read labels and AVOID/DECREASE use of foods with the following in their ingredient list: Sugar, fructose, high fructose corn syrup, sucrose, glucose, maltose, dextrose, molasses, cane sugar, brown sugar, any type of syrup, agave nectar, etc.   . Avoid snacking in between meals- drink water or if you feel you need a snack, pick a high water content snack such as cucumbers, watermelon, or any veggie.  Marland Kitchen Avoid foods made with flour o If you are going to eat food made with flour, choose those made with whole-grains; and, minimize your consumption as much as is tolerable . Avoid processed foods o These foods are generally stocked in the middle of the grocery store.  o Focus on shopping on the perimeter of the grocery.  What to Include . Vegetables o GREEN LEAFY VEGETABLES: Kale, spinach, mustard greens, collard greens, cabbage, broccoli, etc. o OTHER: Asparagus, cauliflower, eggplant, carrots, peas, Brussel sprouts, tomatoes, bell peppers, zucchini, beets, cucumbers, etc. . Grains, seeds, and legumes o Beans: kidney beans, black eyed peas, garbanzo beans, black beans, pinto beans, etc. o Whole, unrefined grains: brown rice, barley, bulgur, oatmeal, etc. . Healthy fats  o Avoid highly processed fats such as vegetable oil o Examples of healthy fats: avocado, olives, virgin olive oil, dark chocolate (?72% Cocoa), nuts (peanuts, almonds, walnuts, cashews, pecans, etc.) o Please still do small amount of these healthy fats, they are dense in calories.  . Low - Moderate Intake of Animal Sources of Protein o Meat sources: chicken, [place], salmon, tuna. Limit to 4 ounces of meat at one time or the size of your palm. o Consider limiting dairy sources, but when choosing dairy focus on:  PLAIN [place] yogurt, cottage cheese, high-protein milk . Fruit o Choose berries   VITAMIN D IS IMPORTANT  Vitamin D goal is between 60-80, yours was 24 last visit.    Please make sure that you are taking your Vitamin D as directed.   It is very important as a natural anti-inflammatory   helping hair, skin, and nails, as well as reducing stroke and heart attack risk.   It helps your bones and helps with mood.   It also decreases numerous cancer risks so please take it as directed.   Low Vit D is associated with a 200-300% higher risk for CANCER   and 200-300% higher risk for HEART   ATTACK  &  STROKE.    .....................................Marland Kitchen  It is also associated with higher death rate at younger ages,   autoimmune diseases like Rheumatoid arthritis, Lupus, Multiple Sclerosis.     Also many other serious conditions, like depression, Alzheimer's  Dementia, infertility, muscle aches, fatigue, fibromyalgia - just to name a few.  +++++++++++++++++++  Can get liquid vitamin D from [place] here in [place] at  [facility] alternatives [address], [place], [place] [address] Or you can try [employer]

## 2019-10-27 NOTE — Progress Notes (Signed)
Ordered CPAP supplies from [facility])- via [facility] portal.

## 2019-10-29 LAB — FERRITIN: Ferritin: 42 ng/mL (ref 16–288)

## 2019-10-29 LAB — MAGNESIUM: Magnesium: 1.6 mg/dL (ref 1.5–2.5)

## 2019-10-29 LAB — COMPLETE METABOLIC PANEL WITH GFR
AG Ratio: 1.4 (calc) (ref 1.0–2.5)
ALT: 6 U/L (ref 6–29)
AST: 12 U/L (ref 10–35)
Albumin: 4.1 g/dL (ref 3.6–5.1)
Alkaline phosphatase (APISO): 52 U/L (ref 37–153)
BUN/Creatinine Ratio: 18 (calc) (ref 6–22)
BUN: 23 mg/dL (ref 7–25)
CO2: 25 mmol/L (ref 20–32)
Calcium: 9.5 mg/dL (ref 8.6–10.4)
Chloride: 107 mmol/L (ref 98–110)
Creat: 1.26 mg/dL — ABNORMAL HIGH (ref 0.50–0.99)
GFR, Est African American: 52 mL/min/{1.73_m2} — ABNORMAL LOW (ref >=60)
GFR, Est Non African American: 45 mL/min/{1.73_m2} — ABNORMAL LOW (ref >=60)
Globulin: 3 g/dL (calc) (ref 1.9–3.7)
Glucose, Bld: 79 mg/dL (ref 65–99)
Potassium: 5.5 mmol/L — ABNORMAL HIGH (ref 3.5–5.3)
Sodium: 136 mmol/L (ref 135–146)
Total Bilirubin: 0.2 mg/dL (ref 0.2–1.2)
Total Protein: 7.1 g/dL (ref 6.1–8.1)

## 2019-10-29 LAB — CBC WITH DIFFERENTIAL/PLATELET
Absolute Monocytes: 372 cells/uL (ref 200–950)
Basophils Absolute: 42 cells/uL (ref 0–200)
Basophils Relative: 0.7 %
Eosinophils Absolute: 312 cells/uL (ref 15–500)
Eosinophils Relative: 5.2 %
HCT: 29.5 % — ABNORMAL LOW (ref 35.0–45.0)
Hemoglobin: 9.5 g/dL — ABNORMAL LOW (ref 11.7–15.5)
Lymphs Abs: 2436 cells/uL (ref 850–3900)
MCH: 27.2 pg (ref 27.0–33.0)
MCHC: 32.2 g/dL (ref 32.0–36.0)
MCV: 84.5 fL (ref 80.0–100.0)
MPV: 10.1 fL (ref 7.5–12.5)
Monocytes Relative: 6.2 %
Neutro Abs: 2838 cells/uL (ref 1500–7800)
Neutrophils Relative %: 47.3 %
Platelets: 261 10*3/uL (ref 140–400)
RBC: 3.49 10*6/uL — ABNORMAL LOW (ref 3.80–5.10)
RDW: 15 % (ref 11.0–15.0)
Total Lymphocyte: 40.6 %
WBC: 6 10*3/uL (ref 3.8–10.8)

## 2019-10-29 LAB — URIC ACID: Uric Acid, Serum: 6.3 mg/dL (ref 2.5–7.0)

## 2019-10-29 LAB — TEST AUTHORIZATION

## 2019-10-29 LAB — VITAMIN B12: Vitamin B-12: >2000 pg/mL — ABNORMAL HIGH (ref 200–1100)

## 2019-10-29 LAB — IRON: Iron: 56 ug/dL (ref 45–160)

## 2019-10-29 LAB — LIPID PANEL
Cholesterol: 193 mg/dL (ref <200)
HDL: 101 mg/dL (ref >=50)
LDL Cholesterol (Calc): 73 mg/dL (calc)
Non-HDL Cholesterol (Calc): 92 mg/dL (calc) (ref <130)
Total CHOL/HDL Ratio: 1.9 (calc) (ref <5.0)
Triglycerides: 107 mg/dL (ref <150)

## 2019-10-29 LAB — TSH: TSH: 3.2 mIU/L (ref 0.40–4.50)

## 2019-10-29 LAB — VITAMIN D 25 HYDROXY (VIT D DEFICIENCY, FRACTURES): Vit D, 25-Hydroxy: 31 ng/mL (ref 30–100)

## 2019-10-31 NOTE — Addendum Note (Signed)
Addended by: Vicie Mutters R on: 10/31/2019 08:50 AM   Modules accepted: Orders

## 2019-11-03 ENCOUNTER — Telehealth: Payer: Self-pay | Admitting: Physician Assistant

## 2019-11-03 NOTE — Telephone Encounter (Signed)
Received a new hem referral from Vicie Mutters, [place] for normocytic anemia. Pt has been cld and scheduled to see Cassie on 11/3 at 130pm w/labs at 1pm. Pt aware to arrive 20 minutes early.

## 2019-11-12 ENCOUNTER — Other Ambulatory Visit: Payer: Self-pay | Admitting: Internal Medicine

## 2019-11-12 MED ORDER — ALLOPURINOL 100 MG PO TABS
ORAL_TABLET | ORAL | 0 refills | Status: DC
Start: 2019-11-12 — End: 2019-12-26

## 2019-11-20 ENCOUNTER — Other Ambulatory Visit: Payer: Self-pay | Admitting: Internal Medicine

## 2019-11-26 ENCOUNTER — Encounter: Payer: BC Managed Care – PPO | Admitting: Physician Assistant

## 2019-11-26 ENCOUNTER — Other Ambulatory Visit: Payer: BC Managed Care – PPO

## 2019-12-01 ENCOUNTER — Telehealth: Payer: Self-pay

## 2019-12-01 NOTE — Telephone Encounter (Signed)
Patient is inquiring in regard to the CPAP machine. Wants to know the status of when she is getting this, her coughing has gotten worse.

## 2019-12-02 NOTE — Telephone Encounter (Signed)
Requested the status on the CPAP DME for Yolanda Espinoza from [place] [facility] order site [facility]. I'll update you on the feed back. They did advise me that CPAPs are on back order when I sent this in 74mth ago. They stated as much as 90 days on back order, COVID has diminished availability. Please consdier asking our providers to re check her if her symptoms are worsening. Thanks

## 2019-12-03 NOTE — Telephone Encounter (Signed)
Patient notified of status, is planning on coming in for a FU appointment because her cough is getting worse.

## 2019-12-03 NOTE — Progress Notes (Signed)
[place] [phone]   [phone]  CONSULT NOTE  REFERRING PHYSICIAN: Vicie Mutters PA-C  REASON FOR CONSULTATION:  Anemia  HPI JAMEL DUNTON is a 64 y.o. female with a past medical history significant for hypertension, mitral regurgitation, chronic diastolic heart failure, brain aneyrysm, OSA, GERD, vitamin D deficiency, hyperlipidemia, gout, CVA, and seizures is referred to the clinic for evaluation of anemia.   The patient has a history of iron deficiency anemia for several years.  Per chart review it appears that the patient has had microcytic anemia since at least 2009, which is the oldest records visible to me. She believes she was first told that she was anemic approximately 12 to 14 years ago when she reportedly had her first brain aneurysm.  The patient had a follow-up appointment with her PCP on 11/06/2019.  The patient had routine blood work performed that day which showed persistent but stable anemia with a hemoglobin of 9.5 and it was normocytic. Her WBC were within normal limits at 6.0 as well as her platelets which were 261k. Her ferritin was WNL at 42 and her iron was within normal limits at 56. The patient was referred to the clinic today for further evaluation.  Overall, the patient is feeling well today.  She denies fatigue. She denies craving ice or starch.  She denies any significant shortness of breath, chest pain, or palpitations. She does not take iron supplements due to constipation. The patient denies any abnormal bleeding or bruising including epistaxis, gingival bleeding, hemoptysis, hematemesis, melena, hematochezia, or hematuria. She reports easy bruising due to her brilinta use. She states that she has a history of CVA. She is post menopausal and denies any abnormal vaginal bleeding.  She denies any particular dietary habits such as being a vegan or vegetarian; however, she only eats red meat about 1x per month due to a  patient preference. She does eat plenty of leafy greens, spinach, and veggies.  The patient had a colonoscopy in 2020 under the care of Dr. Collene Mares that was reportedly normal. She also had a capsule endoscopy and upper endoscopy which just noted one possible small AVM. The patient has been taken Voltaren for about months due to joint pain. She is also on 81 mg aspirin.  She denies any nausea or vomiting. Ab pain, fever, chills, night sweats, weight loss.   The patient's family history consist of a mother who reportedly passed away secondary to alcoholism.  The patient's sister has colorectal cancer.  The patient has 2 other sisters who have hypertension.  The patient denies any family history of anemia, sickle cell anemia, or thalassemia's.  The patient works as a Educational psychologist.  She is married and has 2 children.  The patient denies any drug or tobacco use.  The patient quit drinking alcohol approximately 40 years ago.   HPI  Past Medical History:  Diagnosis Date  . Anemia   . Arthritis   . Chronic diastolic heart failure ([place])   . GERD (gastroesophageal reflux disease)   . Heart murmur   . Hyperlipidemia   . Hypertension may, 1973  . Mitral regurgitation    a. Echo 01/2012 mild LVH, EF 55-65%, Gr 2 DD, mod MR, mild LAE, PASP 36;  b. Echo (02/2013):  EF 55-60%, Gr 1 DD, mild to mod MR, mild LAE (no sig change since prior echo) // c. Echo 10/18: EF 55-60, normal wall motion, grade 2 diastolic dysfunction, trivial AI, mild to moderate  MR, mild LAE, trivial PI, PASP 38    . OSA on CPAP    7 yrs  . Peripheral vascular disease ([place])    cerebral aneursym  . Seizures ([place]) 01/2017  . Stroke [facility]) 07   no weakness jan 29th 2019 no defecits  . Subarachnoid hemorrhage due to ruptured aneurysm ([place])    2003 - s/p coiling  . Unspecified vitamin D deficiency     Past Surgical History:  Procedure Laterality Date  . ABDOMINAL HYSTERECTOMY     complete  . ANEURYSM COILING    . COLONOSCOPY WITH PROPOFOL  N/A 09/05/2018   Procedure: COLONOSCOPY WITH PROPOFOL;  Surgeon: Juanita Craver, MD;  Location: [facility] ENDOSCOPY;  Service: Endoscopy;  Laterality: N/A;  . ESOPHAGOGASTRODUODENOSCOPY (EGD) WITH PROPOFOL N/A 09/05/2018   Procedure: ESOPHAGOGASTRODUODENOSCOPY (EGD) WITH PROPOFOL;  Surgeon: Juanita Craver, MD;  Location: [facility] ENDOSCOPY;  Service: Endoscopy;  Laterality: N/A;  . GIVENS CAPSULE STUDY N/A 10/17/2018   Procedure: GIVENS CAPSULE STUDY;  Surgeon: Juanita Craver, MD;  Location: [facility] ENDOSCOPY;  Service: Endoscopy;  Laterality: N/A;  . head surgery     aneurysm ruptured- stroke  . IR 3D INDEPENDENT WKST  09/04/2016  . IR ANGIO INTRA EXTRACRAN SEL COM CAROTID INNOMINATE BILAT MOD SED  09/04/2016  . IR ANGIO INTRA EXTRACRAN SEL INTERNAL CAROTID UNI L MOD SED  02/21/2017  . IR ANGIO VERTEBRAL SEL VERTEBRAL BILAT MOD SED  09/04/2016  . IR ANGIOGRAM EXTREMITY LEFT  09/04/2016  . IR ANGIOGRAM FOLLOW UP STUDY  02/21/2017  . IR NEURO EACH ADD'L AFTER BASIC UNI LEFT (MS)  02/21/2017  . IR RADIOLOGIST EVAL & MGMT  10/11/2016  . IR RADIOLOGIST EVAL & MGMT  03/07/2017  . IR TRANSCATH/EMBOLIZ  02/21/2017  . RADIOLOGY WITH ANESTHESIA N/A 04/23/2013   Procedure: RADIOLOGY WITH ANESTHESIA;  Surgeon: Rob Hickman, MD;  Location: [place];  Service: Radiology;  Laterality: N/A;  . RADIOLOGY WITH ANESTHESIA N/A 10/30/2016   Procedure: EMBOLIZATION;  Surgeon: Luanne Bras, MD;  Location: [place];  Service: Radiology;  Laterality: N/A;  . RADIOLOGY WITH ANESTHESIA N/A 02/21/2017   Procedure: EMBOLIZATION;  Surgeon: Luanne Bras, MD;  Location: [place];  Service: Radiology;  Laterality: N/A;  . TONSILLECTOMY AND ADENOIDECTOMY    . TUBAL LIGATION      Family History  Problem Relation Age of Onset  . Hypertension Father   . Kidney disease Father   . Hypertension Sister   . Cancer Sister 27       Colon cancer  . Hypertension Sister   . Hypertension Sister     Social History Social History   Tobacco Use  .  Smoking status: Former Smoker    Packs/day: 0.25    Years: 3.00    Pack years: 0.75    Types: Cigarettes    Quit date: 01/24/1999    Years since quitting: 20.8  . Smokeless tobacco: Never Used  Vaping Use  . Vaping Use: Never used  Substance Use Topics  . Alcohol use: No  . Drug use: No    Allergies  Allergen Reactions  . Ace Inhibitors Nausea And Vomiting  . Aspirin Nausea And Vomiting    Can take coated Aspirin    Current Outpatient Medications  Medication Sig Dispense Refill  . allopurinol (ZYLOPRIM) 100 MG tablet Take     1 tablet    Daily     to Prevent Gout 90 tablet 0  . amLODipine (NORVASC) 10 MG tablet TAKE 1 TABLET  DAILY FOR BLOOD PRESSURE 30 tablet 0  . aspirin EC 81 MG tablet Take 1 tablet (81 mg total) by mouth daily.    . Calcium Carb-Cholecalciferol (CALCIUM 600 + D PO) Take 1 tablet by mouth daily.    . cholecalciferol (VITAMIN D3) 25 MCG (1000 UT) tablet Take 1,000 Units by mouth daily.    . cyclobenzaprine (FLEXERIL) 10 MG tablet TAKE 1 TABLET BY MOUTH AT BEDTIME FOR MUSCLE SPASMS 30 tablet 0  . diclofenac (VOLTAREN) 75 MG EC tablet Take 1 tablet    2 x /day    with Food for Pain & Inflammation 180 tablet 0  . diclofenac sodium (VOLTAREN) 1 % GEL Apply 2 g topically 4 (four) times daily. Right wrist and Right elbow 3 Tube 2  . estrogens, conjugated, (PREMARIN) 0.9 MG tablet Take 1 tablet Daily for Hormones 90 tablet 1  . ferrous sulfate 325 (65 FE) MG tablet Take 325 mg by mouth daily with breakfast.    . furosemide (LASIX) 40 MG tablet Take 1 tablet Daily for BP, Fluid Retention & Ankle Swelling 90 tablet 1  . gabapentin (NEURONTIN) 300 MG capsule TAKE 1 CAPSULE BY MOUTH THREE TIMES DAILY 90 capsule 0  . levETIRAcetam (KEPPRA) 1000 MG tablet TAKE 1 TABLET BY MOUTH TWICE DAILY TO PREVENT SEIZURES 60 tablet 0  . Multiple Vitamins-Minerals (MULTIVITAMIN WITH MINERALS) tablet Take 1 tablet by mouth daily.    Marland Kitchen nystatin (NYSTATIN) powder APPLY TOPICALLY WITH BRUSH TO  AREA AFTER A SHOWER TO PREVENT INFECTION 30 g 0  . nystatin cream (MYCOSTATIN) Apply 1 application topically 2 (two) times daily. 30 g 1  . olmesartan (BENICAR) 40 MG tablet Take 1 tablet Daily for BP 90 tablet 0  . Omega-3 Fatty Acids (FISH OIL) 1000 MG CAPS Take 1,000 mg by mouth daily.    . pantoprazole (PROTONIX) 40 MG tablet Take     1 tablet     Daily       to Prevent Indigestion & Heartburn 30 tablet 0  . ticagrelor (BRILINTA) 90 MG TABS tablet Take 1 tablet 2 x /day to Prevent Blood Clots 180 tablet 1  . traMADol (ULTRAM) 50 MG tablet 1 tablet as needed up to 3 x a daily for severe pain 15 tablet 0  . vitamin B-12 (CYANOCOBALAMIN) 500 MCG tablet Take 1,000 mcg by mouth daily.     No current facility-administered medications for this visit.     REVIEW OF SYSTEMS:   Review of Systems  Constitutional: Negative for appetite change, chills, fatigue, fever and unexpected weight change.  HENT: Negative for mouth sores, nosebleeds, sore throat and trouble swallowing.   Eyes: Negative for eye problems and icterus.  Respiratory: Negative for cough, hemoptysis, shortness of breath and wheezing.   Cardiovascular: Negative for chest pain and leg swelling.  Gastrointestinal: Negative for abdominal pain, constipation, diarrhea, nausea and vomiting.  Genitourinary: Negative for bladder incontinence, difficulty urinating, dysuria, frequency and hematuria.   Musculoskeletal: Negative for back pain, gait problem, neck pain and neck stiffness.  Skin: Negative for itching and rash.  Neurological: Negative for dizziness, extremity weakness, gait problem, headaches, light-headedness and seizures.  Hematological: Negative for adenopathy. Does not bruise/bleed easily.  Psychiatric/Behavioral: Negative for confusion, depression and sleep disturbance. The patient is not nervous/anxious.     PHYSICAL EXAMINATION:  Blood pressure (!) 162/86, pulse 70, temperature (!) 97.5 F (36.4 C), temperature source  Tympanic, resp. rate 18, height '5\' 10"'  (1.778 m), weight 213 lb 8 oz (  96.8 kg), SpO2 100 %.  ECOG PERFORMANCE STATUS: 0  Physical Exam  Constitutional: Oriented to person, place, and time and well-developed, well-nourished, and in no distress.  HENT:  Head: Normocephalic and atraumatic.  Mouth/Throat: Oropharynx is clear and moist. No oropharyngeal exudate.  Eyes: Conjunctivae are normal. Right eye exhibits no discharge. Left eye exhibits no discharge. No scleral icterus.  Neck: Normal range of motion. Neck supple.  Cardiovascular: Normal rate, regular rhythm,systolic murmur noted sounds and intact distal pulses.   Pulmonary/Chest: Effort normal and breath sounds normal. No respiratory distress. No wheezes. No rales.  Abdominal: Soft. Bowel sounds are normal. Exhibits no distension and no mass. There is no tenderness.  Musculoskeletal: Normal range of motion. Exhibits no edema.  Lymphadenopathy:    No cervical adenopathy.  Neurological: Alert and oriented to person, place, and time. Exhibits normal muscle tone. Gait normal. Coordination normal.  Skin: Skin is warm and dry. No rash noted. Not diaphoretic. No erythema. No pallor.  Psychiatric: Mood, memory and judgment normal.  Vitals reviewed.  LABORATORY DATA: Lab Results  Component Value Date   WBC 8.6 12/04/2019   HGB 9.3 (L) 12/04/2019   HCT 29.5 (L) 12/04/2019   MCV 87.3 12/04/2019   PLT 272 12/04/2019      Chemistry      Component Value Date/Time   NA 138 12/04/2019 1223   NA 140 11/08/2016 1149   K 5.2 (H) 12/04/2019 1223   CL 107 12/04/2019 1223   CO2 26 12/04/2019 1223   BUN 26 (H) 12/04/2019 1223   BUN 12 11/08/2016 1149   CREATININE 1.47 (H) 12/04/2019 1223   CREATININE 1.26 (H) 10/27/2019 1018      Component Value Date/Time   CALCIUM 9.2 12/04/2019 1223   ALKPHOS 61 12/04/2019 1223   AST 13 (L) 12/04/2019 1223   ALT <6 12/04/2019 1223   BILITOT 0.3 12/04/2019 1223       RADIOGRAPHIC STUDIES: No  results found.  ASSESSMENT: This is a very pleasant 64 year old 61 American female referred to the clinic for anemia.     PLAN: The patient was seen with Dr. Julien Nordmann. The patient had several lab studies performed today including a CBC, CMP, LDH, SPEP with immunofixation, FOBT stool cards, folic acid, O24, and iron studies performed. Her labs show persistent but is stable anemia with a hemoglobin of 9.3.  Her WBCs and platelets are within normal limits.  The patient's CMP shows persistent but stable renal insufficiency with a creatinine of 1.47.  The patient's other lab studies are pending at this time.  Dr. Julien Nordmann recommends waiting for the rest of her pending lab studies before determining further evaluation. The patient was given stool cards and will return them to the clinic once complete. Dr. Julien Nordmann believes her anemia may be related to chronic disease/chronic kidney disease. Dr. Julien Nordmann would not recommend a bone marrow biopsy at this time due to not other cytopenias.  We will see her back for a follow up visit in 1 month for evaluation and to review her lab studies and repeat her CBC.   The patient voices understanding of current disease status and treatment options and is in agreement with the current care plan.  All questions were answered. The patient knows to call the clinic with any problems, questions or concerns. We can certainly see the patient much sooner if necessary.  Thank you so much for allowing me to participate in the care of Chriss Czar. I will continue  to follow up the patient with you and assist in her care.  The total time spent in the appointment was 60 minutes.  Disclaimer: This note was dictated with voice recognition software. Similar sounding words can inadvertently be transcribed and may not be corrected upon review.   Lupe Handley L Terryn Redner December 04, 2019, 1:55 PM  ADDENDUM: Hematology/Oncology Attending: I had a face to face encounter with  the patient.  I recommended her care plan.  This is a very pleasant 64 years old African-American female who presented for evaluation of persistent anemia for several years.  The patient had upper endoscopy and colonoscopy as well as small bowel endoscopy last year that were unremarkable except for one area of AV malformation in the small intestine. She also has chronic renal insufficiency which may be a contributing factor for her anemia of chronic disease. We order several studies for evaluation of her anemia including repeat CBC, iron study, ferritin, serum folate, serum protein electrophoresis as well as stool card for Hemoccult. We will arrange for the patient to come back for follow-up visit in 1 months for evaluation and discussion of her pending lab results and further recommendation regarding her condition. If there is no clear etiology for her anemia, we may consider her for a bone marrow biopsy and aspirate to rule out any underlying bone marrow abnormality. The patient agreed to the current plan. She was advised to call immediately if she has any concerning symptoms in the interval.  Disclaimer: This note was dictated with voice recognition software. Similar sounding words can inadvertently be transcribed and may be missed upon review. Eilleen Kempf, MD 12/05/19

## 2019-12-04 ENCOUNTER — Inpatient Hospital Stay: Payer: BC Managed Care – PPO | Attending: Physician Assistant

## 2019-12-04 ENCOUNTER — Other Ambulatory Visit: Payer: Self-pay

## 2019-12-04 ENCOUNTER — Encounter: Payer: Self-pay | Admitting: Physician Assistant

## 2019-12-04 ENCOUNTER — Other Ambulatory Visit: Payer: Self-pay | Admitting: Physician Assistant

## 2019-12-04 ENCOUNTER — Inpatient Hospital Stay (HOSPITAL_BASED_OUTPATIENT_CLINIC_OR_DEPARTMENT_OTHER): Payer: BC Managed Care – PPO | Admitting: Physician Assistant

## 2019-12-04 VITALS — BP 162/86 | HR 70 | Temp 97.5°F | Resp 18 | Ht 70.0 in | Wt 213.5 lb

## 2019-12-04 DIAGNOSIS — Z79899 Other long term (current) drug therapy: Secondary | ICD-10-CM | POA: Diagnosis not present

## 2019-12-04 DIAGNOSIS — Z7982 Long term (current) use of aspirin: Secondary | ICD-10-CM | POA: Insufficient documentation

## 2019-12-04 DIAGNOSIS — I10 Essential (primary) hypertension: Secondary | ICD-10-CM | POA: Diagnosis not present

## 2019-12-04 DIAGNOSIS — Z87891 Personal history of nicotine dependence: Secondary | ICD-10-CM | POA: Insufficient documentation

## 2019-12-04 DIAGNOSIS — N189 Chronic kidney disease, unspecified: Secondary | ICD-10-CM | POA: Insufficient documentation

## 2019-12-04 DIAGNOSIS — D649 Anemia, unspecified: Secondary | ICD-10-CM | POA: Insufficient documentation

## 2019-12-04 DIAGNOSIS — Z8673 Personal history of transient ischemic attack (TIA), and cerebral infarction without residual deficits: Secondary | ICD-10-CM | POA: Insufficient documentation

## 2019-12-04 DIAGNOSIS — Z800 Family history of malignant neoplasm of digestive organs: Secondary | ICD-10-CM | POA: Diagnosis not present

## 2019-12-04 DIAGNOSIS — Z780 Asymptomatic menopausal state: Secondary | ICD-10-CM | POA: Insufficient documentation

## 2019-12-04 DIAGNOSIS — E611 Iron deficiency: Secondary | ICD-10-CM | POA: Insufficient documentation

## 2019-12-04 DIAGNOSIS — K5520 Angiodysplasia of colon without hemorrhage: Secondary | ICD-10-CM | POA: Insufficient documentation

## 2019-12-04 DIAGNOSIS — Z8249 Family history of ischemic heart disease and other diseases of the circulatory system: Secondary | ICD-10-CM | POA: Insufficient documentation

## 2019-12-04 DIAGNOSIS — I5032 Chronic diastolic (congestive) heart failure: Secondary | ICD-10-CM | POA: Diagnosis not present

## 2019-12-04 DIAGNOSIS — Z811 Family history of alcohol abuse and dependence: Secondary | ICD-10-CM | POA: Insufficient documentation

## 2019-12-04 DIAGNOSIS — I130 Hypertensive heart and chronic kidney disease with heart failure and stage 1 through stage 4 chronic kidney disease, or unspecified chronic kidney disease: Secondary | ICD-10-CM | POA: Diagnosis not present

## 2019-12-04 DIAGNOSIS — D638 Anemia in other chronic diseases classified elsewhere: Secondary | ICD-10-CM | POA: Insufficient documentation

## 2019-12-04 LAB — CMP (CANCER CENTER ONLY)
ALT: <6 U/L (ref 0–44)
AST: 13 U/L — ABNORMAL LOW (ref 15–41)
Albumin: 3.5 g/dL (ref 3.5–5.0)
Alkaline Phosphatase: 61 U/L (ref 38–126)
Anion gap: 5 (ref 5–15)
BUN: 26 mg/dL — ABNORMAL HIGH (ref 8–23)
CO2: 26 mmol/L (ref 22–32)
Calcium: 9.2 mg/dL (ref 8.9–10.3)
Chloride: 107 mmol/L (ref 98–111)
Creatinine: 1.47 mg/dL — ABNORMAL HIGH (ref 0.44–1.00)
GFR, Estimated: 40 mL/min — ABNORMAL LOW (ref >60)
Glucose, Bld: 79 mg/dL (ref 70–99)
Potassium: 5.2 mmol/L — ABNORMAL HIGH (ref 3.5–5.1)
Sodium: 138 mmol/L (ref 135–145)
Total Bilirubin: 0.3 mg/dL (ref 0.3–1.2)
Total Protein: 7.4 g/dL (ref 6.5–8.1)

## 2019-12-04 LAB — CBC WITH DIFFERENTIAL (CANCER CENTER ONLY)
Abs Immature Granulocytes: 0.01 10*3/uL (ref 0.00–0.07)
Basophils Absolute: 0 10*3/uL (ref 0.0–0.1)
Basophils Relative: 1 %
Eosinophils Absolute: 0.4 10*3/uL (ref 0.0–0.5)
Eosinophils Relative: 5 %
HCT: 29.5 % — ABNORMAL LOW (ref 36.0–46.0)
Hemoglobin: 9.3 g/dL — ABNORMAL LOW (ref 12.0–15.0)
Immature Granulocytes: 0 %
Lymphocytes Relative: 43 %
Lymphs Abs: 3.7 10*3/uL (ref 0.7–4.0)
MCH: 27.5 pg (ref 26.0–34.0)
MCHC: 31.5 g/dL (ref 30.0–36.0)
MCV: 87.3 fL (ref 80.0–100.0)
Monocytes Absolute: 0.5 10*3/uL (ref 0.1–1.0)
Monocytes Relative: 6 %
Neutro Abs: 3.9 10*3/uL (ref 1.7–7.7)
Neutrophils Relative %: 45 %
Platelet Count: 272 10*3/uL (ref 150–400)
RBC: 3.38 MIL/uL — ABNORMAL LOW (ref 3.87–5.11)
RDW: 14.9 % (ref 11.5–15.5)
WBC Count: 8.6 10*3/uL (ref 4.0–10.5)
nRBC: 0 % (ref 0.0–0.2)

## 2019-12-04 LAB — IRON AND TIBC
Iron: 50 ug/dL (ref 41–142)
Saturation Ratios: 15 % — ABNORMAL LOW (ref 21–57)
TIBC: 330 ug/dL (ref 236–444)
UIBC: 280 ug/dL (ref 120–384)

## 2019-12-04 LAB — FOLATE: Folate: 9.6 ng/mL (ref >5.9)

## 2019-12-04 LAB — FERRITIN: Ferritin: 58 ng/mL (ref 11–307)

## 2019-12-05 ENCOUNTER — Telehealth: Payer: Self-pay | Admitting: Internal Medicine

## 2019-12-05 ENCOUNTER — Encounter: Payer: Self-pay | Admitting: Physician Assistant

## 2019-12-05 LAB — PROTEIN ELECTROPHORESIS, SERUM, WITH REFLEX
A/G Ratio: 1 (ref 0.7–1.7)
Albumin ELP: 3.4 g/dL (ref 2.9–4.4)
Alpha-1-Globulin: 0.3 g/dL (ref 0.0–0.4)
Alpha-2-Globulin: 0.8 g/dL (ref 0.4–1.0)
Beta Globulin: 1 g/dL (ref 0.7–1.3)
Gamma Globulin: 1.4 g/dL (ref 0.4–1.8)
Globulin, Total: 3.5 g/dL (ref 2.2–3.9)
Total Protein ELP: 6.9 g/dL (ref 6.0–8.5)

## 2019-12-05 NOTE — Telephone Encounter (Signed)
Scheduled per los. Called and spoke with patient. Confirmed appt 

## 2019-12-08 NOTE — Progress Notes (Signed)
Follow up OV  Assessment and Plan:  Persistent cough Cough- multifactorial-   Continue PPI, raise HOB on blocks, GERD diet, add H2i in AM-  may need to add H2/refer GI. Get on allergy pill, voice rest, suppress cough with OTC sugar free candy and robitussin/delsym  - consider ENT referral  Also possible mild dysphagia leading to daytime cough with drinking/eating - consider speech pathology referral if cough not improving Follow up 1 month.   Morbid Obesity  Obesity with co morbidities- long discussion about weight loss, diet, and exercise  OSA on CPAP  weight loss advised.  Very important with heart/CVA history to continue on CPAP CPAP recalled - now with new persistent cough and elevated BP I do not want her off the CPAP for too long due to her history of anyerusm, entered DME supplies order and messaged referral coordinator to assist with replacement device or supplies to restart treatment  Essential hypertension - continue current medications for now, monitor closely at home for progress with restarting CPAP and controlling cough - DASH diet, exercise and monitor at home. Call if persistently greater than 130/80.   Discussed med's effects and SE's.  Over 30 minutes of exam, counseling, chart review, and complex decision making was performed this visit.  Future Appointments  Date Time Provider [place]  01/05/2020  9:30 AM [facility] None  01/05/2020 10:00 AM Heilingoetter, Cassandra L, PA-C [facility] None  01/26/2020 11:00 AM Liane Comber, NP [facility] None  02/12/2020 10:20 AM Nahser, Wonda Cheng, MD [facility]    HPI  64 y.o. AA female with htn, morbid obesity, brain aneurysm and OSA on CPAP.  presents for follow up OV for cough and OSA.  She has been on CPAP since 2013, had at [facility], was doing well but recently CPAP machine was recalled, needing replacement. Unsure who her DME is.  Per previous provider note, she sses  [facility].    PAP pressure setting is 15 cm H20, per sleepstudy from 05/[id number], ahi WAS 12.2.  Has another machine at home, not sure how old it is, but needs supplies to use this.    She also reports persistent cough, triggered by inhalation, drinking water or any food intake, but notably worse at night. She reports coughing with drinking or food is ongoing since her aneurysm/stroke/seizure, but has noted worse coughing at night ? Became worse after she stopped using CPAP machine. She also admits to allergy sx, post nasal drip, AM thick secretions, admits not taking antihistamine. She also has GERD, taking pantoprazole 40 mg at night, but questions if she may be having some breakthrough. She did have recent EGD in 10/31/2018 by Dr. Collene Mares for iron def anemia that showed ? Mild erosion vs AVM. Patient denies burning, belching, bloating.   Cough feels triggered in throat. Denies dyspnea, wheezing, coughing up secretions (other than yellow in AM). Hasn't tried any interventions for this.   BMI is Body mass index is 30.85 kg/m., she is working on diet and exercise., BP up some, and she has been coughing.  Wt Readings from Last 3 Encounters:  12/10/19 215 lb (97.5 kg)  12/04/19 213 lb 8 oz (96.8 kg)  10/27/19 217 lb (98.4 kg)    She was admitted 01/2017 for left ICA aneursym for a pipeline stent but had a seizure and AMS after her procedure, she is on brilinta, following with Dr. Corena Pilgrim, on keppra. Had some R arm weakness.   Current Medications:   Current Outpatient Medications (  Endocrine & Metabolic):  .  estrogens, conjugated, (PREMARIN) 0.9 MG tablet, Take 1 tablet Daily for Hormones  Current Outpatient Medications (Cardiovascular):  .  amLODipine (NORVASC) 10 MG tablet, TAKE 1 TABLET DAILY FOR BLOOD PRESSURE .  furosemide (LASIX) 40 MG tablet, Take 1 tablet Daily for BP, Fluid Retention & Ankle Swelling .  olmesartan (BENICAR) 40 MG tablet, Take 1 tablet Daily for BP  Current Outpatient  Medications (Respiratory):  .  fexofenadine (ALLEGRA ALLERGY) 180 MG tablet, Take 1 tablet (180 mg total) by mouth daily.  Current Outpatient Medications (Analgesics):  .  allopurinol (ZYLOPRIM) 100 MG tablet, Take     1 tablet    Daily     to Prevent Gout .  aspirin EC 81 MG tablet, Take 1 tablet (81 mg total) by mouth daily. .  diclofenac (VOLTAREN) 75 MG EC tablet, Take 1 tablet    2 x /day    with Food for Pain & Inflammation .  traMADol (ULTRAM) 50 MG tablet, 1 tablet as needed up to 3 x a daily for severe pain  Current Outpatient Medications (Hematological):  .  ferrous sulfate 325 (65 FE) MG tablet, Take 325 mg by mouth daily with breakfast. .  ticagrelor (BRILINTA) 90 MG TABS tablet, Take 1 tablet 2 x /day to Prevent Blood Clots .  vitamin B-12 (CYANOCOBALAMIN) 500 MCG tablet, Take 1,000 mcg by mouth daily.  Current Outpatient Medications (Other):  Marland Kitchen  Calcium Carb-Cholecalciferol (CALCIUM 600 + D PO), Take 1 tablet by mouth daily. .  cholecalciferol (VITAMIN D3) 25 MCG (1000 UT) tablet, Take 1,000 Units by mouth daily. .  cyclobenzaprine (FLEXERIL) 10 MG tablet, TAKE 1 TABLET BY MOUTH AT BEDTIME FOR MUSCLE SPASMS .  diclofenac sodium (VOLTAREN) 1 % GEL, Apply 2 g topically 4 (four) times daily. Right wrist and Right elbow .  levETIRAcetam (KEPPRA) 1000 MG tablet, TAKE 1 TABLET BY MOUTH TWICE DAILY TO PREVENT SEIZURES .  Multiple Vitamins-Minerals (MULTIVITAMIN WITH MINERALS) tablet, Take 1 tablet by mouth daily. Marland Kitchen  nystatin (NYSTATIN) powder, APPLY TOPICALLY WITH BRUSH TO AREA AFTER A SHOWER TO PREVENT INFECTION .  nystatin cream (MYCOSTATIN), Apply 1 application topically 2 (two) times daily. .  Omega-3 Fatty Acids (FISH OIL) 1000 MG CAPS, Take 1,000 mg by mouth daily. .  pantoprazole (PROTONIX) 40 MG tablet, Take     1 tablet     Daily       to Prevent Indigestion & Heartburn .  famotidine (PEPCID) 20 MG tablet, Take 1 tab daily in the morning 30-60 min prior to breakfast for  reflux. .  gabapentin (NEURONTIN) 300 MG capsule, TAKE 1 CAPSULE BY MOUTH THREE TIMES DAILY (Patient not taking: Reported on 12/10/2019)  Medical History:  Past Medical History:  Diagnosis Date  . Anemia   . Arthritis   . Chronic diastolic heart failure ([place])   . GERD (gastroesophageal reflux disease)   . Heart murmur   . Hyperlipidemia   . Hypertension may, 1973  . Mitral regurgitation    a. Echo 01/2012 mild LVH, EF 55-65%, Gr 2 DD, mod MR, mild LAE, PASP 36;  b. Echo (02/2013):  EF 55-60%, Gr 1 DD, mild to mod MR, mild LAE (no sig change since prior echo) // c. Echo 10/18: EF 55-60, normal wall motion, grade 2 diastolic dysfunction, trivial AI, mild to moderate MR, mild LAE, trivial PI, PASP 38    . OSA on CPAP    7 yrs  . Peripheral vascular disease (  [place])    cerebral aneursym  . Seizures ([place]) 01/2017  . Stroke [facility]) 07   no weakness jan 29th 2019 no defecits  . Subarachnoid hemorrhage due to ruptured aneurysm ([place])    2003 - s/p coiling  . Unspecified vitamin D deficiency    Allergies Allergies  Allergen Reactions  . Ace Inhibitors Nausea And Vomiting  . Aspirin Nausea And Vomiting    Can take coated Aspirin   Surgical History: reviewed and unchanged Family History: reviewed and unchanged Social History: reviewed and unchanged  Review of Systems: Review of Systems  Constitutional: Negative for malaise/fatigue and weight loss.  HENT: Negative for hearing loss and tinnitus.   Eyes: Negative for blurred vision and double vision.  Respiratory: Positive for cough and sputum production (AM secretions). Negative for shortness of breath and wheezing.   Cardiovascular: Negative for chest pain, palpitations, orthopnea, claudication and leg swelling.  Gastrointestinal: Negative for abdominal pain, blood in stool, constipation, diarrhea, heartburn, melena, nausea and vomiting.  Genitourinary: Negative.   Musculoskeletal: Negative for joint pain and myalgias.  Skin: Negative for  rash.  Neurological: Negative for dizziness, tingling, sensory change, weakness and headaches.  Endo/Heme/Allergies: Positive for environmental allergies (sneezing, watery itchy eyes). Negative for polydipsia.  Psychiatric/Behavioral: Negative.   All other systems reviewed and are negative.   Physical Exam: Estimated body mass index is 30.85 kg/m as calculated from the following:   Height as of 12/04/19: 5\' 10"  (1.778 m).   Weight as of this encounter: 215 lb (97.5 kg). BP (!) 150/92   Pulse 68   Temp (!) 97.5 F (36.4 C)   Wt 215 lb (97.5 kg)   SpO2 99%   BMI 30.85 kg/m  General Appearance: Well nourished, in no apparent distress.  Eyes: PERRLA, EOMs, conjunctiva no swelling or erythema Sinuses: No Frontal/maxillary tenderness  ENT/Mouth: Ext aud canals clear, normal light reflex with TMs without erythema, bulging. Good dentition. No erythema, swelling, or exudate on post pharynx. Tonsils not swollen or erythematous. Hearing normal.  Neck: Supple, thyroid slightly enlarged. No bruits  Respiratory: Respiratory effort normal, BS equal bilaterally without rales, rhonchi, wheezing or stridor.  Cardio: RRR with holosystolic murmur RSB, without rubs or gallops. Brisk peripheral pulses with minimal edema.  Chest: symmetric, with normal excursions and percussion.  Abdomen: Soft, nontender, no guarding, rebound, hernias, masses, or organomegaly.  Lymphatics: Non tender without lymphadenopathy.  Musculoskeletal: Full ROM all peripheral extremities,5/5 strength, and normal gait.  Skin: Warm, dry without rashes, lesions, ecchymosis. Neuro: Cranial nerves intact, reflexes equal bilaterally. Normal muscle tone, no cerebellar symptoms. Sensation intact.  Psych: Awake and oriented X 3, normal affect, Insight and Judgment appropriate.    Yolanda Espinoza 3:14 PM [facility]

## 2019-12-10 ENCOUNTER — Other Ambulatory Visit: Payer: Self-pay

## 2019-12-10 ENCOUNTER — Ambulatory Visit (INDEPENDENT_AMBULATORY_CARE_PROVIDER_SITE_OTHER): Payer: BC Managed Care – PPO | Admitting: Adult Health

## 2019-12-10 ENCOUNTER — Encounter: Payer: Self-pay | Admitting: Adult Health

## 2019-12-10 VITALS — BP 150/92 | HR 68 | Temp 97.5°F | Wt 215.0 lb

## 2019-12-10 DIAGNOSIS — J309 Allergic rhinitis, unspecified: Secondary | ICD-10-CM | POA: Diagnosis not present

## 2019-12-10 DIAGNOSIS — Z9989 Dependence on other enabling machines and devices: Secondary | ICD-10-CM

## 2019-12-10 DIAGNOSIS — K219 Gastro-esophageal reflux disease without esophagitis: Secondary | ICD-10-CM | POA: Diagnosis not present

## 2019-12-10 DIAGNOSIS — G4733 Obstructive sleep apnea (adult) (pediatric): Secondary | ICD-10-CM | POA: Diagnosis not present

## 2019-12-10 DIAGNOSIS — R053 Chronic cough: Secondary | ICD-10-CM

## 2019-12-10 DIAGNOSIS — I10 Essential (primary) hypertension: Secondary | ICD-10-CM

## 2019-12-10 MED ORDER — FAMOTIDINE 20 MG PO TABS: ORAL_TABLET | ORAL | 1 refills | Status: DC

## 2019-12-10 MED ORDER — FEXOFENADINE HCL 180 MG PO TABS: 180.0000 mg | ORAL_TABLET | Freq: Every day | ORAL | 2 refills | Status: DC

## 2019-12-10 NOTE — Patient Instructions (Signed)
° ° °  Try putting 2 inch blocks under headboard  All allegra daily for 2-4 weeks - monitor cough  If not improved, try fomotidine 20 mg in the morning before breakfast  Pick up delsym or robitussin OTC and take as needed for cough -     Common causes of cough OR hoarseness OR sore throat:   Allergies, Viral Infections, Acid Reflux and Bacterial Infections.   Allergies and viral infections cause a cough OR sore throat by post nasal drip and are often worse at night, can also have sneezing, lower grade fevers, clear/yellow mucus. This is best treated with allergy medications or nasal sprays.  Please get on allegra for 1-2 weeks The strongest is allegra or fexafinadine  Cheapest at [employer], [employer], [employer]   Bacterial infections are more severe than allergies or viral infections with fever, teeth pain, fatigue. This can be treated with prednisone and the same over the counter medication and after 7 days can be treated with an antibiotic.   Silent reflux/GERD can cause a cough OR sore throat OR hoarseness WITHOUT heart burn because the esophagus that goes to the stomach and trachea that goes to the lungs are very close and when you lay down the acid can irritate your throat and lungs. This can cause hoarseness, cough, and wheezing. Please stop any alcohol or anti-inflammatories like aleve/advil/ibuprofen and start an over the counter Prilosec or omeprazole 1-2 times daily 48mins before food for 2 weeks, then switch to over the counter zantac/ratinidine or pepcid/famotadine once at night for 2 weeks.    sometimes irritation causes more irritation. Try voice rest, use sugar free cough drops to prevent coughing, and try to stop clearing your throat.   If you ever have a cough that does not go away after trying these things please make a follow up visit for further evaluation or we can refer you to a specialist. Or if you ever have shortness of breath or chest pain go to the ER.

## 2019-12-15 ENCOUNTER — Telehealth: Payer: Self-pay | Admitting: Internal Medicine

## 2019-12-15 NOTE — Telephone Encounter (Signed)
Called patient vm full- Need to know from patient.Marland KitchenMarland KitchenDid patient request CPAP supplies from [employer] [employer] on 12-14-19? They called our office requesting auth on CPAP equipment- [phone] them to fax order.

## 2019-12-26 ENCOUNTER — Other Ambulatory Visit: Payer: Self-pay

## 2019-12-26 DIAGNOSIS — R1032 Left lower quadrant pain: Secondary | ICD-10-CM

## 2019-12-26 DIAGNOSIS — M545 Low back pain, unspecified: Secondary | ICD-10-CM

## 2019-12-26 MED ORDER — FAMOTIDINE 20 MG PO TABS: ORAL_TABLET | ORAL | 1 refills | Status: DC

## 2019-12-26 MED ORDER — AMLODIPINE BESYLATE 10 MG PO TABS: ORAL_TABLET | ORAL | 0 refills | Status: DC

## 2019-12-26 MED ORDER — ALLOPURINOL 100 MG PO TABS: ORAL_TABLET | ORAL | 0 refills | Status: DC

## 2019-12-26 MED ORDER — PANTOPRAZOLE SODIUM 40 MG PO TBEC: DELAYED_RELEASE_TABLET | ORAL | 0 refills | Status: DC

## 2019-12-26 MED ORDER — OLMESARTAN MEDOXOMIL 40 MG PO TABS: ORAL_TABLET | ORAL | 0 refills | Status: DC

## 2019-12-26 MED ORDER — ESTROGENS CONJUGATED 0.9 MG PO TABS: ORAL_TABLET | ORAL | 1 refills | Status: DC

## 2019-12-26 MED ORDER — DICLOFENAC SODIUM 75 MG PO TBEC: DELAYED_RELEASE_TABLET | ORAL | 0 refills | Status: DC

## 2019-12-26 MED ORDER — FEXOFENADINE HCL 180 MG PO TABS: 180.0000 mg | ORAL_TABLET | Freq: Every day | ORAL | 2 refills | Status: AC

## 2019-12-26 MED ORDER — CYCLOBENZAPRINE HCL 10 MG PO TABS: ORAL_TABLET | ORAL | 0 refills | Status: DC

## 2019-12-26 MED ORDER — TICAGRELOR 90 MG PO TABS: ORAL_TABLET | ORAL | 1 refills | Status: DC

## 2019-12-26 MED ORDER — LEVETIRACETAM 1000 MG PO TABS: ORAL_TABLET | ORAL | 0 refills | Status: DC

## 2019-12-26 MED ORDER — GABAPENTIN 300 MG PO CAPS
300.0000 mg | ORAL_CAPSULE | Freq: Three times a day (TID) | ORAL | 0 refills | Status: DC
Start: 2019-12-26 — End: 2020-01-26

## 2020-01-01 NOTE — Progress Notes (Signed)
[facility] OFFICE PROGRESS NOTE  Unk Pinto, MD [address] [place] [address]  DIAGNOSIS: Anemia   PRIOR THERAPY: None  CURRENT THERAPY: None  INTERVAL HISTORY: Yolanda Espinoza 64 y.o. female returns to the clinic today for a follow-up visit.  The patient was referred to the clinic and seen last month for anemia.  The patient is up-to-date on her colonoscopy.  The patient was given stool cards to return to the clinic which she has not done so at this time. She was in a rush this morning and forgot to bring them in.  The patient takes Brilinta for her history of a CVA.  She also takes an 81 mg aspirin.  The patient denies any particular symptoms from her anemia.  She denies any fatigue. She denies craving ice or starch. She denies any significant shortness of breath, chest pain, or palpitations. She does not take iron supplements due to constipation.The patient denies any abnormal bleeding or bruising including epistaxis, gingival bleeding, hemoptysis, hematemesis, melena, hematochezia, or hematuria. She reports easy bruising due to her brilinta use. She denies any nausea or vomiting.  She takes iron tablets a few times a week. She also takes B12 supplements. She is here for evaluation and to review her lab results.   MEDICAL HISTORY: Past Medical History:  Diagnosis Date  . Anemia   . Arthritis   . Chronic diastolic heart failure ([place])   . GERD (gastroesophageal reflux disease)   . Heart murmur   . Hyperlipidemia   . Hypertension may, 1973  . Mitral regurgitation    a. Echo 01/2012 mild LVH, EF 55-65%, Gr 2 DD, mod MR, mild LAE, PASP 36;  b. Echo (02/2013):  EF 55-60%, Gr 1 DD, mild to mod MR, mild LAE (no sig change since prior echo) // c. Echo 10/18: EF 55-60, normal wall motion, grade 2 diastolic dysfunction, trivial AI, mild to moderate MR, mild LAE, trivial PI, PASP 38    . OSA on CPAP    7 yrs  . Peripheral vascular disease ([place])     cerebral aneursym  . Seizures ([place]) 01/2017  . Stroke [facility]) 07   no weakness jan 29th 2019 no defecits  . Subarachnoid hemorrhage due to ruptured aneurysm ([place])    2003 - s/p coiling  . Unspecified vitamin D deficiency     ALLERGIES:  is allergic to ace inhibitors and aspirin.  MEDICATIONS:  Current Outpatient Medications  Medication Sig Dispense Refill  . allopurinol (ZYLOPRIM) 100 MG tablet Take     1 tablet    Daily     to Prevent Gout 90 tablet 0  . amLODipine (NORVASC) 10 MG tablet TAKE 1 TABLET DAILY FOR BLOOD PRESSURE 30 tablet 0  . aspirin EC 81 MG tablet Take 1 tablet (81 mg total) by mouth daily.    . Calcium Carb-Cholecalciferol (CALCIUM 600 + D PO) Take 1 tablet by mouth daily.    . cholecalciferol (VITAMIN D3) 25 MCG (1000 UT) tablet Take 1,000 Units by mouth daily.    . cyclobenzaprine (FLEXERIL) 10 MG tablet TAKE 1 TABLET BY MOUTH AT BEDTIME FOR MUSCLE SPASMS 30 tablet 0  . diclofenac (VOLTAREN) 75 MG EC tablet Take 1 tablet    2 x /day    with Food for Pain & Inflammation 180 tablet 0  . diclofenac sodium (VOLTAREN) 1 % GEL Apply 2 g topically 4 (four) times daily. Right wrist and Right elbow 3 Tube 2  .  estrogens, conjugated, (PREMARIN) 0.9 MG tablet Take 1 tablet Daily for Hormones 90 tablet 1  . famotidine (PEPCID) 20 MG tablet Take 1 tab daily in the morning 30-60 min prior to breakfast for reflux. 30 tablet 1  . ferrous sulfate 325 (65 FE) MG tablet Take 325 mg by mouth daily with breakfast.    . fexofenadine (ALLEGRA ALLERGY) 180 MG tablet Take 1 tablet (180 mg total) by mouth daily. 30 tablet 2  . furosemide (LASIX) 40 MG tablet Take 1 tablet Daily for BP, Fluid Retention & Ankle Swelling 90 tablet 1  . levETIRAcetam (KEPPRA) 1000 MG tablet TAKE 1 TABLET BY MOUTH TWICE DAILY TO PREVENT SEIZURES 60 tablet 0  . Multiple Vitamins-Minerals (MULTIVITAMIN WITH MINERALS) tablet Take 1 tablet by mouth daily.    Marland Kitchen nystatin (NYSTATIN) powder APPLY TOPICALLY WITH BRUSH TO  AREA AFTER A SHOWER TO PREVENT INFECTION 30 g 0  . nystatin cream (MYCOSTATIN) Apply 1 application topically 2 (two) times daily. 30 g 1  . olmesartan (BENICAR) 40 MG tablet Take 1 tablet Daily for BP 90 tablet 0  . Omega-3 Fatty Acids (FISH OIL) 1000 MG CAPS Take 1,000 mg by mouth daily.    . ticagrelor (BRILINTA) 90 MG TABS tablet Take 1 tablet 2 x /day to Prevent Blood Clots 180 tablet 1  . traMADol (ULTRAM) 50 MG tablet 1 tablet as needed up to 3 x a daily for severe pain 15 tablet 0  . vitamin B-12 (CYANOCOBALAMIN) 500 MCG tablet Take 1,000 mcg by mouth daily.    Marland Kitchen gabapentin (NEURONTIN) 300 MG capsule Take 1 capsule (300 mg total) by mouth 3 (three) times daily. (Patient not taking: Reported on 01/05/2020) 90 capsule 0   No current facility-administered medications for this visit.    SURGICAL HISTORY:  Past Surgical History:  Procedure Laterality Date  . ABDOMINAL HYSTERECTOMY     complete  . ANEURYSM COILING    . COLONOSCOPY WITH PROPOFOL N/A 09/05/2018   Procedure: COLONOSCOPY WITH PROPOFOL;  Surgeon: Juanita Craver, MD;  Location: [facility];  Service: Endoscopy;  Laterality: N/A;  . ESOPHAGOGASTRODUODENOSCOPY (EGD) WITH PROPOFOL N/A 09/05/2018   Procedure: ESOPHAGOGASTRODUODENOSCOPY (EGD) WITH PROPOFOL;  Surgeon: Juanita Craver, MD;  Location: [facility];  Service: Endoscopy;  Laterality: N/A;  . GIVENS CAPSULE STUDY N/A 10/17/2018   Procedure: GIVENS CAPSULE STUDY;  Surgeon: Juanita Craver, MD;  Location: [facility] ENDOSCOPY;  Service: Endoscopy;  Laterality: N/A;  . head surgery     aneurysm ruptured- stroke  . IR 3D INDEPENDENT [facility]ST  09/04/2016  . IR ANGIO INTRA EXTRACRAN SEL COM CAROTID INNOMINATE BILAT MOD SED  09/04/2016  . IR ANGIO INTRA EXTRACRAN SEL INTERNAL CAROTID UNI L MOD SED  02/21/2017  . IR ANGIO VERTEBRAL SEL VERTEBRAL BILAT MOD SED  09/04/2016  . IR ANGIOGRAM EXTREMITY LEFT  09/04/2016  . IR ANGIOGRAM FOLLOW UP STUDY  02/21/2017  . IR NEURO EACH ADD'L AFTER BASIC UNI LEFT  (MS)  02/21/2017  . IR RADIOLOGIST EVAL & MGMT  10/11/2016  . IR RADIOLOGIST EVAL & MGMT  03/07/2017  . IR TRANSCATH/EMBOLIZ  02/21/2017  . RADIOLOGY WITH ANESTHESIA N/A 04/23/2013   Procedure: RADIOLOGY WITH ANESTHESIA;  Surgeon: Rob Hickman, MD;  Location: [facility];  Service: Radiology;  Laterality: N/A;  . RADIOLOGY WITH ANESTHESIA N/A 10/30/2016   Procedure: EMBOLIZATION;  Surgeon: Luanne Bras, MD;  Location: [facility];  Service: Radiology;  Laterality: N/A;  . RADIOLOGY WITH ANESTHESIA N/A 02/21/2017   Procedure: EMBOLIZATION;  Surgeon: Luanne Bras, MD;  Location: [facility];  Service: Radiology;  Laterality: N/A;  . TONSILLECTOMY AND ADENOIDECTOMY    . TUBAL LIGATION      REVIEW OF SYSTEMS:   Review of Systems  Constitutional: Negative for appetite change, chills, fatigue, fever and unexpected weight change.  HENT:   Negative for mouth sores, nosebleeds, sore throat and trouble swallowing.   Eyes: Negative for eye problems and icterus.  Respiratory: Negative for cough, hemoptysis, shortness of breath and wheezing.   Cardiovascular: Negative for chest pain and leg swelling.  Gastrointestinal: Negative for abdominal pain, constipation, diarrhea, nausea and vomiting.  Genitourinary: Negative for bladder incontinence, difficulty urinating, dysuria, frequency and hematuria.   Musculoskeletal: Negative for back pain, gait problem, neck pain and neck stiffness.  Skin: Negative for itching and rash.  Neurological: Negative for dizziness, extremity weakness, gait problem, headaches, light-headedness and seizures.  Hematological: Negative for adenopathy. Does not bruise/bleed easily.  Psychiatric/Behavioral: Negative for confusion, depression and sleep disturbance. The patient is not nervous/anxious.     PHYSICAL EXAMINATION:  Blood pressure (!) 176/97, pulse 77, temperature 98 F (36.7 C), temperature source Tympanic, resp. rate 13, height _0  (1.778 m), weight 214 lb (97.1 kg),  SpO2 100 %.  ECOG PERFORMANCE STATUS: 0 - Asymptomatic  Physical Exam  Constitutional: Oriented to person, place, and time and well-developed, well-nourished, and in no distress.  HENT:  Head: Normocephalic and atraumatic.  Mouth/Throat: Oropharynx is clear and moist. No oropharyngeal exudate.  Eyes: Conjunctivae are normal. Right eye exhibits no discharge. Left eye exhibits no discharge. No scleral icterus.  Neck: Normal range of motion. Neck supple.  Cardiovascular: Normal rate, regular rhythm,systolic murmur noted sounds and intact distal pulses.   Pulmonary/Chest: Effort normal and breath sounds normal. No respiratory distress. No wheezes. No rales.  Abdominal: Soft. Bowel sounds are normal. Exhibits no distension and no mass. There is no tenderness.  Musculoskeletal: Normal range of motion. Exhibits no edema.  Lymphadenopathy:    No cervical adenopathy.  Neurological: Alert and oriented to person, place, and time. Exhibits normal muscle tone. Gait normal. Coordination normal.  Skin: Skin is warm and dry. No rash noted. Not diaphoretic. No erythema. No pallor.  Psychiatric: Mood, memory and judgment normal.  Vitals reviewed. LABORATORY DATA: Lab Results  Component Value Date   WBC 7.0 01/05/2020   HGB 9.7 (L) 01/05/2020   HCT 31.1 (L) 01/05/2020   MCV 86.9 01/05/2020   PLT 313 01/05/2020      Chemistry      Component Value Date/Time   NA 138 12/04/2019 1223   NA 140 11/08/2016 1149   K 5.2 (H) 12/04/2019 1223   CL 107 12/04/2019 1223   CO2 26 12/04/2019 1223   BUN 26 (H) 12/04/2019 1223   BUN 12 11/08/2016 1149   CREATININE 1.47 (H) 12/04/2019 1223   CREATININE 1.26 (H) 10/27/2019 1018      Component Value Date/Time   CALCIUM 9.2 12/04/2019 1223   ALKPHOS 61 12/04/2019 1223   AST 13 (L) 12/04/2019 1223   ALT <6 12/04/2019 1223   BILITOT 0.3 12/04/2019 1223       RADIOGRAPHIC STUDIES:  No results found.   ASSESSMENT/PLAN:  This is a very pleasant 64 year  old 74 American female referred to the clinic for anemia.  The patient was seen with Dr. Julien Nordmann. Dr. Julien Nordmann reviewed the labs with the patient. Her CBC from today shows persistent anemia with a hemoglobin of 9.7.   Dr. Julien Nordmann  discussed that further work up would include a bone marrow biopsy and aspirate; however, Dr. Julien Nordmann would like to get the results of her stool cards first before arranging this procedure.   The patient will return the stool cards. I will call the patient once we have the results for further instructions. If the stool cards are positive, then we will recommend she follow up with GI for evaluation. If negative, we will discuss a possible bone marrow biopsy.   The patient was advised to call immediately if she has any concerning symptoms in the interval. The patient voices understanding of current disease status and treatment options and is in agreement with the current care plan. All questions were answered. The patient knows to call the clinic with any problems, questions or concerns. We can certainly see the patient much sooner if necessary   No orders of the defined types were placed in this encounter.    Jarman Litton L Conway Fedora, PA-C 01/05/20  ADDENDUM: Hematology/Oncology Attending: I had a face-to-face encounter with the patient today.  I recommended her care plan.  This is a very pleasant 64 years old African-American female with persistent anemia likely secondary to iron deficiency.  The patient had several studies performed recently that were unremarkable including the iron study.  She did not do the stool card for the Hemoccult.  If it is positive we will send the patient to gastroenterology for evaluation.  But if negative we may have to consider the patient for a bone marrow biopsy and aspirate to identify the etiology of her anemia. The patient agreed to the current plan. We will arrange her follow-up visit based on the stool cards. She was advised  to call immediately if she has any concerning symptoms in the interval.  Disclaimer: This note was dictated with voice recognition software. Similar sounding words can inadvertently be transcribed and may be missed upon review. Eilleen Kempf, MD 01/05/20

## 2020-01-05 ENCOUNTER — Encounter: Payer: Self-pay | Admitting: Physician Assistant

## 2020-01-05 ENCOUNTER — Other Ambulatory Visit: Payer: Self-pay

## 2020-01-05 ENCOUNTER — Inpatient Hospital Stay: Payer: BC Managed Care – PPO

## 2020-01-05 ENCOUNTER — Inpatient Hospital Stay: Payer: BC Managed Care – PPO | Attending: Physician Assistant | Admitting: Physician Assistant

## 2020-01-05 VITALS — BP 176/97 | HR 77 | Temp 98.0°F | Resp 13 | Ht 70.0 in | Wt 214.0 lb

## 2020-01-05 DIAGNOSIS — E611 Iron deficiency: Secondary | ICD-10-CM | POA: Diagnosis not present

## 2020-01-05 DIAGNOSIS — Z801 Family history of malignant neoplasm of trachea, bronchus and lung: Secondary | ICD-10-CM | POA: Diagnosis not present

## 2020-01-05 DIAGNOSIS — Z800 Family history of malignant neoplasm of digestive organs: Secondary | ICD-10-CM | POA: Insufficient documentation

## 2020-01-05 DIAGNOSIS — D649 Anemia, unspecified: Secondary | ICD-10-CM

## 2020-01-05 DIAGNOSIS — Z79899 Other long term (current) drug therapy: Secondary | ICD-10-CM | POA: Insufficient documentation

## 2020-01-05 DIAGNOSIS — N189 Chronic kidney disease, unspecified: Secondary | ICD-10-CM | POA: Insufficient documentation

## 2020-01-05 DIAGNOSIS — Z8249 Family history of ischemic heart disease and other diseases of the circulatory system: Secondary | ICD-10-CM | POA: Insufficient documentation

## 2020-01-05 DIAGNOSIS — I5032 Chronic diastolic (congestive) heart failure: Secondary | ICD-10-CM | POA: Insufficient documentation

## 2020-01-05 DIAGNOSIS — Z7982 Long term (current) use of aspirin: Secondary | ICD-10-CM | POA: Diagnosis not present

## 2020-01-05 DIAGNOSIS — Z780 Asymptomatic menopausal state: Secondary | ICD-10-CM | POA: Insufficient documentation

## 2020-01-05 DIAGNOSIS — Z87891 Personal history of nicotine dependence: Secondary | ICD-10-CM | POA: Insufficient documentation

## 2020-01-05 DIAGNOSIS — Z8673 Personal history of transient ischemic attack (TIA), and cerebral infarction without residual deficits: Secondary | ICD-10-CM | POA: Diagnosis not present

## 2020-01-05 DIAGNOSIS — I130 Hypertensive heart and chronic kidney disease with heart failure and stage 1 through stage 4 chronic kidney disease, or unspecified chronic kidney disease: Secondary | ICD-10-CM | POA: Insufficient documentation

## 2020-01-05 LAB — CBC WITH DIFFERENTIAL (CANCER CENTER ONLY)
Abs Immature Granulocytes: 0.01 10*3/uL (ref 0.00–0.07)
Basophils Absolute: 0.1 10*3/uL (ref 0.0–0.1)
Basophils Relative: 1 %
Eosinophils Absolute: 0.4 10*3/uL (ref 0.0–0.5)
Eosinophils Relative: 5 %
HCT: 31.1 % — ABNORMAL LOW (ref 36.0–46.0)
Hemoglobin: 9.7 g/dL — ABNORMAL LOW (ref 12.0–15.0)
Immature Granulocytes: 0 %
Lymphocytes Relative: 46 %
Lymphs Abs: 3.2 10*3/uL (ref 0.7–4.0)
MCH: 27.1 pg (ref 26.0–34.0)
MCHC: 31.2 g/dL (ref 30.0–36.0)
MCV: 86.9 fL (ref 80.0–100.0)
Monocytes Absolute: 0.4 10*3/uL (ref 0.1–1.0)
Monocytes Relative: 6 %
Neutro Abs: 3 10*3/uL (ref 1.7–7.7)
Neutrophils Relative %: 42 %
Platelet Count: 313 10*3/uL (ref 150–400)
RBC: 3.58 MIL/uL — ABNORMAL LOW (ref 3.87–5.11)
RDW: 15.3 % (ref 11.5–15.5)
WBC Count: 7 10*3/uL (ref 4.0–10.5)
nRBC: 0 % (ref 0.0–0.2)

## 2020-01-14 ENCOUNTER — Other Ambulatory Visit: Payer: Self-pay

## 2020-01-14 DIAGNOSIS — D649 Anemia, unspecified: Secondary | ICD-10-CM

## 2020-01-14 LAB — OCCULT BLOOD X 1 CARD TO LAB, STOOL
Fecal Occult Bld: NEGATIVE
Fecal Occult Bld: NEGATIVE
Fecal Occult Bld: NEGATIVE

## 2020-01-15 ENCOUNTER — Telehealth: Payer: Self-pay | Admitting: Internal Medicine

## 2020-01-15 ENCOUNTER — Telehealth: Payer: Self-pay | Admitting: Physician Assistant

## 2020-01-15 ENCOUNTER — Telehealth: Payer: Self-pay

## 2020-01-15 ENCOUNTER — Other Ambulatory Visit: Payer: Self-pay | Admitting: Physician Assistant

## 2020-01-15 DIAGNOSIS — D649 Anemia, unspecified: Secondary | ICD-10-CM

## 2020-01-15 NOTE — Telephone Encounter (Signed)
Pt returned Cassandra, PA-C's call and the following information has been relayed to the pt. She expressed understanding of this information. Please see notes from previous phone encounter below:   "I received the results of the patient's stool cards. The stool cards were negative for blood. I attempted to call the patient to let her know that her stool cards were negative for blood and that I was arranging for a bone marrow biopsy and aspirate because the etiology of her anemia is unclear. I will arrange for a follow up with [place] after her bone marrow biopsy and aspirate. I was unable to reach the patient and her voicemail was full".

## 2020-01-15 NOTE — Telephone Encounter (Signed)
I received the results of the patient's stool cards. The stool cards were negative for blood. I attempted to call the patient to let her know that her stool cards were negative for blood and that I was arranging for a bone marrow biopsy and aspirate because the etiology of her anemia is unclear. I will arrange for a follow up with [place] after her bone marrow biopsy and aspirate. I was unable to reach the patient and her voicemail was full.

## 2020-01-15 NOTE — Telephone Encounter (Signed)
Scheduled labs and follow-up appointment per 12/23 schedule message. Patient is aware.

## 2020-01-23 NOTE — Progress Notes (Unsigned)
Complete Physical  Assessment and Plan:  Encounter for general adult medical examination with abnormal findings 1 year  Chronic diastolic heart failure Emphasized need for weight loss  Emphasized salt restriction, less than 2093m a day. Encouraged daily monitoring of the patient's weight, call office if 3 lb weight loss or gain in a day.  Encouraged regular exercise.  decrease your fluid intake to less than 2 L daily  Cardiology is managing; appears euvolemic today    Mitral regurgitation Monitored by cardio, moderate per recent ECHO/MRI, no SOB/CP  Essential hypertension - continue medications, DASH diet, exercise and monitor at home. Call if greater than 130/80.  - CBC with Differential/Platelet - CMP/GFR - TSH - Urinalysis, [facility] reflex [facility] - Microalbumin / creatinine urine ratio  Abnormal glucose Discussed general issues about diabetes pathophysiology and management., Educational material distributed., Suggested low cholesterol diet., Encouraged aerobic exercise., Discussed foot care., Reminded to get yearly retinal exam. - Hemoglobin A1c  Hyperlipidemia -continue medications, check lipids, decrease fatty foods, increase activity. - Lipid panel  Vitamin D deficiency - Vit D  25 hydroxy (rtn osteoporosis monitoring)  Medication management - Magnesium  OSA on CPAP Significant difficulty getting supplies, CPAP was recalled, likely needs new sleep study After discussion with pt and referral coordinator will proceed with referal to sleep specialist for further management   Brain aneurysm Dr. DEstanislado Pandyfollows  S/p coil, on brilinta Control blood pressure, cholesterol, glucose, increase exercise.    [place]iz[place] CVA/aneurysm; Dr. DEstanislado Pandyfollowing, on keppra, he checks levels annually, defer today after discussion with patient   Gout of foot, unspecified cause, unspecified chronicity, unspecified laterality - Gout- recheck Uric  acid as needed, Diet discussed, continue medications.  Gastroesophageal reflux disease, esophagitis presence not specified Well managed on current medications Discussed diet, avoiding triggers and other lifestyle changes  Morbid obesity ([place] - BMI 30+ with OSA - long discussion about weight loss, diet, and exercise -recommended diet heavy in fruits and veggies and low in animal meats, cheeses, and dairy products  Anemia In process of hematology workup; pending bone marrow biopsy   Persistent cough Cough- multifactorial-   Continue PPI, raise HOB on blocks, GERD diet, no improvement with antihistamine and adding AM dosing; get CXR  Then if clear proceed with ENT evaluation for laryngoscopy  Also possible mild dysphagia leading to daytime cough with drinking/eating - consider speech pathology referral if cough not improving  Orders Placed This Encounter  Procedures  . DG Chest 2 View  . CBC with Differential/Platelet  . COMPLETE METABOLIC PANEL WITH GFR  . Magnesium  . Lipid panel  . TSH  . Hemoglobin A1c  . Uric acid  . Microalbumin / creatinine urine ratio  . Urinalysis, Routine w reflex microscopic  . Ambulatory referral to Neurology  . Ambulatory referral to ENT  . EKG 12-Lead   Discussed med's effects and SE's. Screening labs and tests as requested with regular follow-up as recommended. Over 40 minutes of exam, counseling, chart review, and complex, high level critical decision making was performed this visit.  Future Appointments  Date Time Provider [place] 02/10/2020  7:30 [facility] None  02/10/2020  9:00 AM WL-CT 1 WL-CT [place]  02/12/2020 10:20 AM Nahser, PWonda Cheng MD [facility]  02/18/2020  9:15 AM [facility]ON[facility] LAB [facility] None  02/18/2020  9:45 AM MCurt Bears MD [facility]MED[facility] None  05/05/2020 11:00 AM CLiane Comber NP [facility] None  08/04/2020 11:30 AM CLiane Comber  NP [facility] None  01/26/2021 10:00 AM  Liane Comber, NP [facility] None    HPI  64 y.o. female  presents for a complete physical. She has Essential hypertension; Mitral regurgitation; Chronic diastolic heart failure (HCC); OSA on CPAP; Vitamin D deficiency; Hyperlipidemia; Brain aneurysm; Abnormal glucose; Medication management; Morbid obesity ([place]) - BMI 30+ with OSA; Gout; Seizure ([place]); Gastroesophageal reflux disease; Anemia; Allergic rhinitis; and Persistent cough on their problem list.  She works at [employer], going to work part time- 2-3 days a week and retire early. She has been walking more at work since the residents have not been able to leave their rooms. 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  They have 2 boys, 4 granddaughters, 1 great grandson.    She was admitted 01/2017 for left ICA aneursym for a pipeline stent but had a seizure and AMS after her procedure, she is on brilinta, following with Dr. Corena Pilgrim, on keppra 1000 mg BID (he checks levels annually). Mas mild residual R arm weakness and some spasticity. Had follow up MRI/MRI 03/05/2019 which showed essentially unchanged from 01/2018 CT but did show possible chronic small vessel ischemic disease. Has annual MRI planned.   She has been on CPAP since 2013, had at [place] [facility], was doing well but recently CPAP machine was recalled, needing replacement. Unsure who her DME is. Per previous provider note, she sses [facility]. Referral for supplies was placed in Nov 2021, but patient states they didn't take insurance, still no solution.   She has reported persistent dry cough, triggered by inhalation, drinking water or any food intake, but notably worse at night. She reports coughing with drinking or food is ongoing since her aneurysm/stroke/seizure, but has noted worse coughing at night ? Became worse after she stopped using CPAP machine. She also admits to allergy sx, post nasal drip, AM thick  secretions, she did start daily antihistamine without benefit. She also has GERD, taking pantoprazole 40 mg at night, but questions if she may be having some breakthrough.   She did have recent EGD in 10/31/2018 by Dr. Collene Mares for iron def anemia that showed ? Mild erosion vs AVM. Patient denies burning, belching, bloating.   and she is on estrogen pills has been taking premarin 0.9 mg for over 10 years. Reports occasional hot flashes but not bad, unsure if this ever helped with hot flashes, interested in coming off of med  BMI is Body mass index is 31.28 kg/m., she has been working on diet and exercise. Wt Readings from Last 3 Encounters:  01/26/20 218 lb (98.9 kg)  01/05/20 214 lb (97.1 kg)  12/10/19 215 lb (97.5 kg)   PVD and CHF MR, follows with Dr. Sondra Come.  Had ECHO 07/2019 showing normal LV function, Mod MR, mass in the RA, but follow up cardiac MRI on 10/02/2019 showed no evidence of atrial mass.   Her blood pressure has been controlled at home, states has been good at home, today their BP is BP: 138/80 She does workout, walks daily for 30 mins. She denies chest pain, shortness of breath, dizziness.   She is not on cholesterol medication and denies myalgias. Her cholesterol is at goal. The cholesterol last visit was:   Lab Results  Component Value Date   CHOL 193 10/27/2019   HDL 101 10/27/2019   LDLCALC 73 10/27/2019   TRIG 107 10/27/2019   CHOLHDL 1.9 10/27/2019   She has been  working on diet and exercise for glucose management, and denies paresthesia of the feet, polydipsia, polyuria and visual disturbances. Last A1C in the office was:  Lab Results  Component Value Date   HGBA1C 5.1 07/18/2019    Patient is NOT on allopurinol for gout, she is on colchicine and does not report a recent flare.  Lab Results  Component Value Date   LABURIC 6.3 10/27/2019   Patient is on Vitamin D supplement, taking 2 caps of unsure dose ? 5000 IU Lab Results  Component Value Date   VD25OH 31  10/27/2019      She has persistent anemia, was recently referred to evaluation by hematology with unremarkable workup thus far, had negative hemoccult and has been recommended bone marrow biopsy for further workup.  CBC Latest Ref Rng & Units 01/05/2020 12/04/2019 10/27/2019  WBC 4.0 - 10.5 K/uL 7.0 8.6 6.0  Hemoglobin 12.0 - 15.0 g/dL 9.7(L) 9.3(L) 9.5(L)  Hematocrit 36.0 - 46.0 % 31.1(L) 29.5(L) 29.5(L)  Platelets 150 - 400 K/uL 313 272 261     Current Medications:   Current Outpatient Medications (Endocrine & Metabolic):  .  estrogens, conjugated, (PREMARIN) 0.625 MG tablet, Take 1 tablet Daily for Hormones  Current Outpatient Medications (Cardiovascular):  .  amLODipine (NORVASC) 10 MG tablet, TAKE 1 TABLET DAILY FOR BLOOD PRESSURE .  furosemide (LASIX) 40 MG tablet, Take 1 tablet Daily for BP, Fluid Retention & Ankle Swelling .  olmesartan (BENICAR) 40 MG tablet, Take 1 tablet Daily for BP  Current Outpatient Medications (Respiratory):  .  fexofenadine (ALLEGRA ALLERGY) 180 MG tablet, Take 1 tablet (180 mg total) by mouth daily.  Current Outpatient Medications (Analgesics):  .  allopurinol (ZYLOPRIM) 100 MG tablet, Take     1 tablet    Daily     to Prevent Gout .  aspirin EC 81 MG tablet, Take 1 tablet (81 mg total) by mouth daily. .  traMADol (ULTRAM) 50 MG tablet, 1 tablet as needed up to 3 x a daily for severe pain .  diclofenac (VOLTAREN) 75 MG EC tablet, Take 1 tablet    2 x /day    with Food for Pain & Inflammation  Current Outpatient Medications (Hematological):  .  ferrous sulfate 325 (65 FE) MG tablet, Take 325 mg by mouth daily with breakfast. .  ticagrelor (BRILINTA) 90 MG TABS tablet, Take 1 tablet 2 x /day to Prevent Blood Clots .  vitamin B-12 (CYANOCOBALAMIN) 500 MCG tablet, Take 1,000 mcg by mouth daily.  Current Outpatient Medications (Other):  Marland Kitchen  Calcium Carb-Cholecalciferol (CALCIUM 600 + D PO), Take 1 tablet by mouth daily. .  cholecalciferol (VITAMIN D3)  25 MCG (1000 UT) tablet, Take 1,000 Units by mouth daily. .  cyclobenzaprine (FLEXERIL) 10 MG tablet, TAKE 1 TABLET BY MOUTH AT BEDTIME FOR MUSCLE SPASMS .  diclofenac sodium (VOLTAREN) 1 % GEL, Apply 2 g topically 4 (four) times daily. Right wrist and Right elbow .  famotidine (PEPCID) 20 MG tablet, Take 1 tab daily in the morning 30-60 min prior to breakfast for reflux. .  levETIRAcetam (KEPPRA) 1000 MG tablet, TAKE 1 TABLET BY MOUTH TWICE DAILY TO PREVENT SEIZURES .  Multiple Vitamins-Minerals (MULTIVITAMIN WITH MINERALS) tablet, Take 1 tablet by mouth daily. Marland Kitchen  nystatin (NYSTATIN) powder, APPLY TOPICALLY WITH BRUSH TO AREA AFTER A SHOWER TO PREVENT INFECTION .  nystatin cream (MYCOSTATIN), Apply 1 application topically 2 (two) times daily. .  Omega-3 Fatty Acids (FISH OIL) 1000 MG CAPS, Take 1,000  mg by mouth daily. Marland Kitchen  gabapentin (NEURONTIN) 300 MG capsule, Take 1 capsule (300 mg total) by mouth 3 (three) times daily.  Health Maintenance:   Immunization History  Administered Date(s) Administered  . Influenza Inj Mdck Quad With Preservative 10/30/2018  . Influenza Split 11/11/2013, 10/23/2014  . Influenza, Seasonal, Injecte, Preservative Fre 10/26/2015  . Influenza,inj,Quad PF,6+ Mos 02/23/2017  . Influenza-Unspecified 11/25/2012  . Moderna Sars-Covid-2 Vaccination 02/13/2019, 03/13/2019  . Tdap 08/25/2013   Tetanus: 2015 Pneumovax: N/A Prevnar 13: N/A Flu vaccine: 2021 Shingrix:  Covid 19: 2/2, 2021, moderna, will schedule  LMP: TAH Pap: TAH, DONE  MGM: 03/2019, [facility], pt will schedule DEXA: N/A  Colonoscopy: 10/2018 Dr. Collene Mares EGD: 10/2018 Dr. Collene Mares  Last Dental Exam: Has partial/bridges, no dentist.  Last Eye Exam: Dr. Efrain Sella, 10/2019, wears glasses, mild cataract   Patient Care Team: Unk Pinto, MD as PCP - General (Internal Medicine) Unk Pinto, MD as PCP - Internal Medicine (Internal Medicine) Nahser, Wonda Cheng, MD as PCP - Cardiology  (Cardiology) Liliane Shi, PA-C as Physician Assistant (Physician Assistant) Juanita Craver, MD as Consulting Physician (Gastroenterology) Mcarthur Rossetti, MD as Consulting Physician (Orthopedic Surgery) Unk Pinto, MD as Consulting Physician (Internal Medicine) Luanne Bras, MD as Consulting Physician (Interventional Radiology) Unk Pinto, MD as Referring Physician (Internal Medicine)  Medical History:  Past Medical History:  Diagnosis Date  . Anemia   . Arthritis   . Chronic diastolic heart failure ([place])   . GERD (gastroesophageal reflux disease)   . Heart murmur   . Hyperlipidemia   . Hypertension may, 1973  . Mitral regurgitation    a. Echo 01/2012 mild LVH, EF 55-65%, Gr 2 DD, mod MR, mild LAE, PASP 36;  b. Echo (02/2013):  EF 55-60%, Gr 1 DD, mild to mod MR, mild LAE (no sig change since prior echo) // c. Echo 10/18: EF 55-60, normal wall motion, grade 2 diastolic dysfunction, trivial AI, mild to moderate MR, mild LAE, trivial PI, PASP 38    . OSA on CPAP    7 yrs  . Peripheral vascular disease ([place])    cerebral aneursym  . Seizures ([place]) 01/2017  . Stroke [facility]) 07   no weakness jan 29th 2019 no defecits  . Subarachnoid hemorrhage due to ruptured aneurysm ([place])    2003 - s/p coiling  . Unspecified vitamin D deficiency    Allergies Allergies  Allergen Reactions  . Ace Inhibitors Nausea And Vomiting  . Aspirin Nausea And Vomiting    Can take coated Aspirin   SURGICAL HISTORY She  has a past surgical history that includes Aneurysm coiling; head surgery; Tonsillectomy and adenoidectomy; Radiology with anesthesia (N/A, 04/23/2013); IR 3D Independent Wkst (09/04/2016); IR Angiogram Extremity Left (09/04/2016); IR ANGIO INTRA EXTRACRAN SEL COM CAROTID INNOMINATE BILAT MOD SED (09/04/2016); IR ANGIO VERTEBRAL SEL VERTEBRAL BILAT MOD SED (09/04/2016); IR Radiologist Eval & Mgmt (10/11/2016); Radiology with anesthesia (N/A, 10/30/2016); Tubal ligation; Radiology  with anesthesia (N/A, 02/21/2017); IR Transcath/Emboliz (02/21/2017); IR ANGIO INTRA EXTRACRAN SEL INTERNAL CAROTID UNI L MOD SED (02/21/2017); IR NEURO EACH ADD'L AFTER BASIC UNI LEFT (MS) (02/21/2017); IR Angiogram Follow Up Study (02/21/2017); IR Radiologist Eval & Mgmt (03/07/2017); Abdominal hysterectomy; Esophagogastroduodenoscopy (egd) with propofol (N/A, 09/05/2018); Colonoscopy with propofol (N/A, 09/05/2018); and Givens capsule study (N/A, 10/17/2018).   FAMILY HISTORY Her family history includes Cancer (age of onset: 68) in her sister; Hypertension in her father, sister, sister, and sister; Kidney disease in her father.   SOCIAL HISTORY She  reports that she quit smoking about 21 years ago. Her smoking use included cigarettes. She has a 0.75 pack-year smoking history. She has never used smokeless tobacco. She reports that she does not drink alcohol and does not use drugs.  Review of Systems: Review of Systems  Constitutional: Negative for malaise/fatigue and weight loss.  HENT: Negative for hearing loss and tinnitus.   Eyes: Negative for blurred vision and double vision.  Respiratory: Positive for cough. Negative for sputum production, shortness of breath and wheezing.   Cardiovascular: Negative for chest pain, palpitations, orthopnea, claudication and leg swelling.  Gastrointestinal: Negative for abdominal pain, blood in stool, constipation, diarrhea, heartburn, melena, nausea and vomiting.  Genitourinary: Negative.   Musculoskeletal: Negative for joint pain and myalgias.  Skin: Negative for rash.  Neurological: Negative for dizziness, tingling, sensory change, weakness and headaches.  Endo/Heme/Allergies: Negative for polydipsia.  Psychiatric/Behavioral: Negative.   All other systems reviewed and are negative.   Physical Exam: Estimated body mass index is 31.28 kg/m as calculated from the following:   Height as of this encounter: '5\' 10"'  (1.778 m).   Weight as of this encounter: 218  lb (98.9 kg). BP 138/80   Pulse 80   Temp (!) 97.3 F (36.3 C)   Ht '5\' 10"'  (1.778 m)   Wt 218 lb (98.9 kg)   SpO2 99%   BMI 31.28 kg/m  General Appearance: Well nourished, in no apparent distress.  Eyes: PERRLA, EOMs, conjunctiva no swelling or erythema Sinuses: No Frontal/maxillary tenderness  ENT/Mouth: Ext aud canals clear, normal light reflex with TMs without erythema, bulging.  No erythema, swelling, or exudate on post pharynx. Tonsils not swollen or erythematous. Hearing normal.  Neck: Supple, thyroid subtly enlarged, no nodules. No bruits  Respiratory: Respiratory effort normal, BS equal bilaterally without rales, rhonchi, wheezing or stridor.  Cardio: RRR with holosystolic murmur LSB, without rubs or gallops. Brisk peripheral pulses with 2+ edema.  Chest: symmetric, with normal excursions and percussion.  Breasts: breasts appear normal, no suspicious masses, no skin or nipple changes or axillary nodes. Abdomen: Soft, nontender, no guarding, rebound, hernias, masses, or organomegaly.  Lymphatics: Non tender without lymphadenopathy.  Genitourinary: defer Musculoskeletal: Full ROM all peripheral extremities,5/5 strength, and antalgic gait.  Skin: Warm, dry without rashes, lesions, ecchymosis. Neuro: Cranial nerves intact, reflexes equal bilaterally. Normal muscle tone, no cerebellar symptoms. Sensation intact.  Psych: Awake and oriented X 3, normal affect, Insight and Judgment appropriate.   EKG: WNL, no ST changes AORTA SCAN: defer  Izora Ribas 4:50 PM [facility]

## 2020-01-26 ENCOUNTER — Encounter: Payer: Self-pay | Admitting: Adult Health

## 2020-01-26 ENCOUNTER — Other Ambulatory Visit: Payer: Self-pay

## 2020-01-26 ENCOUNTER — Ambulatory Visit: Payer: BC Managed Care – PPO | Admitting: Adult Health

## 2020-01-26 ENCOUNTER — Encounter: Payer: BC Managed Care – PPO | Admitting: Adult Health

## 2020-01-26 VITALS — BP 138/80 | HR 80 | Temp 97.3°F | Ht 70.0 in | Wt 218.0 lb

## 2020-01-26 DIAGNOSIS — E785 Hyperlipidemia, unspecified: Secondary | ICD-10-CM

## 2020-01-26 DIAGNOSIS — Z79899 Other long term (current) drug therapy: Secondary | ICD-10-CM

## 2020-01-26 DIAGNOSIS — R1032 Left lower quadrant pain: Secondary | ICD-10-CM

## 2020-01-26 DIAGNOSIS — M109 Gout, unspecified: Secondary | ICD-10-CM

## 2020-01-26 DIAGNOSIS — D649 Anemia, unspecified: Secondary | ICD-10-CM

## 2020-01-26 DIAGNOSIS — Z13220 Encounter for screening for lipoid disorders: Secondary | ICD-10-CM

## 2020-01-26 DIAGNOSIS — Z131 Encounter for screening for diabetes mellitus: Secondary | ICD-10-CM | POA: Diagnosis not present

## 2020-01-26 DIAGNOSIS — I671 Cerebral aneurysm, nonruptured: Secondary | ICD-10-CM

## 2020-01-26 DIAGNOSIS — J309 Allergic rhinitis, unspecified: Secondary | ICD-10-CM

## 2020-01-26 DIAGNOSIS — K219 Gastro-esophageal reflux disease without esophagitis: Secondary | ICD-10-CM

## 2020-01-26 DIAGNOSIS — Z9989 Dependence on other enabling machines and devices: Secondary | ICD-10-CM

## 2020-01-26 DIAGNOSIS — Z1329 Encounter for screening for other suspected endocrine disorder: Secondary | ICD-10-CM

## 2020-01-26 DIAGNOSIS — R053 Chronic cough: Secondary | ICD-10-CM

## 2020-01-26 DIAGNOSIS — Z0000 Encounter for general adult medical examination without abnormal findings: Secondary | ICD-10-CM | POA: Diagnosis not present

## 2020-01-26 DIAGNOSIS — I340 Nonrheumatic mitral (valve) insufficiency: Secondary | ICD-10-CM

## 2020-01-26 DIAGNOSIS — R569 Unspecified convulsions: Secondary | ICD-10-CM

## 2020-01-26 DIAGNOSIS — I10 Essential (primary) hypertension: Secondary | ICD-10-CM | POA: Diagnosis not present

## 2020-01-26 DIAGNOSIS — Z136 Encounter for screening for cardiovascular disorders: Secondary | ICD-10-CM

## 2020-01-26 DIAGNOSIS — E559 Vitamin D deficiency, unspecified: Secondary | ICD-10-CM

## 2020-01-26 DIAGNOSIS — Z1389 Encounter for screening for other disorder: Secondary | ICD-10-CM

## 2020-01-26 DIAGNOSIS — G4733 Obstructive sleep apnea (adult) (pediatric): Secondary | ICD-10-CM

## 2020-01-26 DIAGNOSIS — I5032 Chronic diastolic (congestive) heart failure: Secondary | ICD-10-CM

## 2020-01-26 DIAGNOSIS — R7309 Other abnormal glucose: Secondary | ICD-10-CM

## 2020-01-26 MED ORDER — ESTROGENS CONJUGATED 0.625 MG PO TABS: ORAL_TABLET | ORAL | 0 refills | Status: DC

## 2020-01-26 MED ORDER — DICLOFENAC SODIUM 75 MG PO TBEC: DELAYED_RELEASE_TABLET | ORAL | 0 refills | Status: DC

## 2020-01-26 MED ORDER — GABAPENTIN 300 MG PO CAPS: 300.0000 mg | ORAL_CAPSULE | Freq: Three times a day (TID) | ORAL | 0 refills | Status: DC

## 2020-01-26 NOTE — Patient Instructions (Addendum)
Yolanda Espinoza , Thank you for taking time to come for your Annual Wellness Visit. I appreciate your ongoing commitment to your health goals. Please review the following plan we discussed and let me know if I can assist you in the future.   These are the goals we discussed: Goals    . Weight (lb) < 200 lb (90.7 kg)       This is a list of the screening recommended for you and due dates:  Health Maintenance  Topic Date Due  . COVID-19 Vaccine (3 - Booster for Moderna series) 09/10/2019  . Flu Shot  05/22/2020*  . Mammogram  04/09/2021  . Tetanus Vaccine  08/26/2023  . Colon Cancer Screening  09/04/2028  .  Hepatitis C: One time screening is recommended by Center for Disease Control  (CDC) for  adults born from [birth year] through 1965.   Completed  . HIV Screening  Completed  *Topic was postponed. The date shown is not the original due date.      Please verify current vitamin D dose  Please check with insurance if they cover shingrix - 2 series shingles vaccine - try to get prior to medicare     High-Fiber Diet Fiber, also called dietary fiber, is a type of carbohydrate that is found in fruits, vegetables, whole grains, and beans. A high-fiber diet can have many health benefits. Your health care provider may recommend a high-fiber diet to help:  Prevent constipation. Fiber can make your bowel movements more regular.  Lower your cholesterol.  Relieve the following conditions: ? Swelling of veins in the anus (hemorrhoids). ? Swelling and irritation (inflammation) of specific areas of the digestive tract (uncomplicated diverticulosis). ? A problem of the large intestine (colon) that sometimes causes pain and diarrhea (irritable bowel syndrome, IBS).  Prevent overeating as part of a weight-loss plan.  Prevent heart disease, type 2 diabetes, and certain cancers. What is my plan? The recommended daily fiber intake in grams (g) includes:  38 g for men age 15 or younger.  30 g for  men over age 68.  38 g for women age 13 or younger.  21 g for women over age 67. You can get the recommended daily intake of dietary fiber by:  Eating a variety of fruits, vegetables, grains, and beans.  Taking a fiber supplement, if it is not possible to get enough fiber through your diet. What do I need to know about a high-fiber diet?  It is better to get fiber through food sources rather than from fiber supplements. There is not a lot of research about how effective supplements are.  Always check the fiber content on the nutrition facts label of any prepackaged food. Look for foods that contain 5 g of fiber or more per serving.  Talk with a diet and nutrition specialist (dietitian) if you have questions about specific foods that are recommended or not recommended for your medical condition, especially if those foods are not listed below.  Gradually increase how much fiber you consume. If you increase your intake of dietary fiber too quickly, you may have bloating, cramping, or gas.  Drink plenty of water. Water helps you to digest fiber. What are tips for following this plan?  Eat a wide variety of high-fiber foods.  Make sure that half of the grains that you eat each day are whole grains.  Eat breads and cereals that are made with whole-grain flour instead of refined flour or white flour.  Eat  brown rice, bulgur wheat, or millet instead of white rice.  Start the day with a breakfast that is high in fiber, such as a cereal that contains 5 g of fiber or more per serving.  Use beans in place of meat in soups, salads, and pasta dishes.  Eat high-fiber snacks, such as berries, raw vegetables, nuts, and popcorn.  Choose whole fruits and vegetables instead of processed forms like juice or sauce. What foods can I eat?  Fruits Berries. Pears. Apples. Oranges. Avocado. Prunes and raisins. Dried figs. Vegetables Sweet potatoes. Spinach. Kale. Artichokes. Cabbage. Broccoli.  Cauliflower. Green peas. Carrots. Squash. Grains Whole-grain breads. Multigrain cereal. Oats and oatmeal. Brown rice. Barley. Bulgur wheat. [place]. Quinoa. Bran muffins. Popcorn. Rye wafer crackers. Meats and other proteins Navy, kidney, and pinto beans. Soybeans. Split peas. Lentils. Nuts and seeds. Dairy Fiber-fortified yogurt. Beverages Fiber-fortified soy milk. Fiber-fortified orange juice. Other foods Fiber bars. The items listed above may not be a complete list of recommended foods and beverages. Contact a dietitian for more options. What foods are not recommended? Fruits Fruit juice. Cooked, strained fruit. Vegetables Fried potatoes. Canned vegetables. Well-cooked vegetables. Grains White bread. Pasta made with refined flour. White rice. Meats and other proteins Fatty cuts of meat. Fried chicken or fried fish. Dairy Milk. Yogurt. Cream cheese. Sour cream. Fats and oils Butters. Beverages Soft drinks. Other foods Cakes and pastries. The items listed above may not be a complete list of foods and beverages to avoid. Contact a dietitian for more information. Summary  Fiber is a type of carbohydrate. It is found in fruits, vegetables, whole grains, and beans.  There are many health benefits of eating a high-fiber diet, such as preventing constipation, lowering blood cholesterol, helping with weight loss, and reducing your risk of heart disease, diabetes, and certain cancers.  Gradually increase your intake of fiber. Increasing too fast can result in cramping, bloating, and gas. Drink plenty of water while you increase your fiber.  The best sources of fiber include whole fruits and vegetables, whole grains, nuts, seeds, and beans. This information is not intended to replace advice given to you by your health care provider. Make sure you discuss any questions you have with your health care provider. Document Revised: 11/13/2016 Document Reviewed: 11/13/2016 Elsevier Patient  Education  2020 [employer].

## 2020-01-27 ENCOUNTER — Other Ambulatory Visit: Payer: Self-pay | Admitting: Adult Health

## 2020-01-27 ENCOUNTER — Encounter: Payer: Self-pay | Admitting: Adult Health

## 2020-01-27 DIAGNOSIS — N1832 Chronic kidney disease, stage 3b: Secondary | ICD-10-CM | POA: Insufficient documentation

## 2020-01-27 DIAGNOSIS — N289 Disorder of kidney and ureter, unspecified: Secondary | ICD-10-CM

## 2020-01-27 LAB — CBC WITH DIFFERENTIAL/PLATELET
Absolute Monocytes: 516 cells/uL (ref 200–950)
Basophils Absolute: 36 cells/uL (ref 0–200)
Basophils Relative: 0.4 %
Eosinophils Absolute: 329 cells/uL (ref 15–500)
Eosinophils Relative: 3.7 %
HCT: 30 % — ABNORMAL LOW (ref 35.0–45.0)
Hemoglobin: 9.6 g/dL — ABNORMAL LOW (ref 11.7–15.5)
Lymphs Abs: 3542 cells/uL (ref 850–3900)
MCH: 27.4 pg (ref 27.0–33.0)
MCHC: 32 g/dL (ref 32.0–36.0)
MCV: 85.7 fL (ref 80.0–100.0)
MPV: 10.6 fL (ref 7.5–12.5)
Monocytes Relative: 5.8 %
Neutro Abs: 4477 cells/uL (ref 1500–7800)
Neutrophils Relative %: 50.3 %
Platelets: 310 10*3/uL (ref 140–400)
RBC: 3.5 10*6/uL — ABNORMAL LOW (ref 3.80–5.10)
RDW: 15.3 % — ABNORMAL HIGH (ref 11.0–15.0)
Total Lymphocyte: 39.8 %
WBC: 8.9 10*3/uL (ref 3.8–10.8)

## 2020-01-27 LAB — COMPLETE METABOLIC PANEL WITH GFR
AG Ratio: 1.3 (calc) (ref 1.0–2.5)
ALT: 8 U/L (ref 6–29)
AST: 15 U/L (ref 10–35)
Albumin: 4.5 g/dL (ref 3.6–5.1)
Alkaline phosphatase (APISO): 67 U/L (ref 37–153)
BUN/Creatinine Ratio: 15 (calc) (ref 6–22)
BUN: 27 mg/dL — ABNORMAL HIGH (ref 7–25)
CO2: 23 mmol/L (ref 20–32)
Calcium: 9.9 mg/dL (ref 8.6–10.4)
Chloride: 107 mmol/L (ref 98–110)
Creat: 1.77 mg/dL — ABNORMAL HIGH (ref 0.50–0.99)
GFR, Est African American: 35 mL/min/{1.73_m2} — ABNORMAL LOW (ref >=60)
GFR, Est Non African American: 30 mL/min/{1.73_m2} — ABNORMAL LOW (ref >=60)
Globulin: 3.4 g/dL (calc) (ref 1.9–3.7)
Glucose, Bld: 66 mg/dL (ref 65–99)
Potassium: 4.6 mmol/L (ref 3.5–5.3)
Sodium: 139 mmol/L (ref 135–146)
Total Bilirubin: 0.2 mg/dL (ref 0.2–1.2)
Total Protein: 7.9 g/dL (ref 6.1–8.1)

## 2020-01-27 LAB — URINALYSIS, ROUTINE W REFLEX MICROSCOPIC
Bacteria, UA: NONE SEEN /HPF
Bilirubin Urine: NEGATIVE
Glucose, UA: NEGATIVE
Hgb urine dipstick: NEGATIVE
Hyaline Cast: NONE SEEN /LPF
Ketones, ur: NEGATIVE
Leukocytes,Ua: NEGATIVE
Nitrite: NEGATIVE
RBC / HPF: NONE SEEN /HPF (ref 0–2)
Specific Gravity, Urine: 1.013 (ref 1.001–1.03)
WBC, UA: NONE SEEN /HPF (ref 0–5)
pH: <=5 (ref 5.0–8.0)

## 2020-01-27 LAB — HEMOGLOBIN A1C
Hgb A1c MFr Bld: 5.5 % of total Hgb (ref <5.7)
Mean Plasma Glucose: 111 mg/dL
eAG (mmol/L): 6.2 mmol/L

## 2020-01-27 LAB — LIPID PANEL
Cholesterol: 215 mg/dL — ABNORMAL HIGH (ref <200)
HDL: 106 mg/dL (ref >=50)
LDL Cholesterol (Calc): 63 mg/dL (calc)
Non-HDL Cholesterol (Calc): 109 mg/dL (calc) (ref <130)
Total CHOL/HDL Ratio: 2 (calc) (ref <5.0)
Triglycerides: 379 mg/dL — ABNORMAL HIGH (ref <150)

## 2020-01-27 LAB — URIC ACID: Uric Acid, Serum: 6.4 mg/dL (ref 2.5–7.0)

## 2020-01-27 LAB — MICROALBUMIN / CREATININE URINE RATIO
Creatinine, Urine: 93 mg/dL (ref 20–275)
Microalb Creat Ratio: 143 mcg/mg creat — ABNORMAL HIGH (ref <30)
Microalb, Ur: 13.3 mg/dL

## 2020-01-27 LAB — TSH: TSH: 4.78 mIU/L — ABNORMAL HIGH (ref 0.40–4.50)

## 2020-01-27 LAB — VITAMIN D 25 HYDROXY (VIT D DEFICIENCY, FRACTURES): Vit D, 25-Hydroxy: 29 ng/mL — ABNORMAL LOW (ref 30–100)

## 2020-01-27 LAB — MAGNESIUM: Magnesium: 1.9 mg/dL (ref 1.5–2.5)

## 2020-01-28 ENCOUNTER — Other Ambulatory Visit: Payer: Self-pay | Admitting: Adult Health

## 2020-01-28 DIAGNOSIS — M545 Low back pain, unspecified: Secondary | ICD-10-CM

## 2020-01-29 NOTE — Progress Notes (Signed)
PATIENT IS AWARE OF LAB RESULTS AND INSTRUCTIONS. 10-14 DAY LAB APPT. HAS BEEN SCHEDULED. Yolanda Espinoza [facility]

## 2020-02-09 ENCOUNTER — Other Ambulatory Visit: Payer: Self-pay | Admitting: Radiology

## 2020-02-10 ENCOUNTER — Ambulatory Visit (HOSPITAL_COMMUNITY)
Admission: RE | Admit: 2020-02-10 | Discharge: 2020-02-10 | Disposition: A | Discharge status: Home / Self Care | Payer: BC Managed Care – PPO | Source: Ambulatory Visit | Attending: Physician Assistant | Admitting: Physician Assistant

## 2020-02-10 ENCOUNTER — Other Ambulatory Visit: Payer: Self-pay | Admitting: Internal Medicine

## 2020-02-10 ENCOUNTER — Other Ambulatory Visit: Payer: Self-pay

## 2020-02-10 ENCOUNTER — Encounter (HOSPITAL_COMMUNITY): Payer: Self-pay

## 2020-02-10 DIAGNOSIS — B372 Candidiasis of skin and nail: Secondary | ICD-10-CM

## 2020-02-10 DIAGNOSIS — Z886 Allergy status to analgesic agent status: Secondary | ICD-10-CM | POA: Diagnosis not present

## 2020-02-10 DIAGNOSIS — Z7982 Long term (current) use of aspirin: Secondary | ICD-10-CM | POA: Diagnosis not present

## 2020-02-10 DIAGNOSIS — D72820 Lymphocytosis (symptomatic): Secondary | ICD-10-CM | POA: Diagnosis not present

## 2020-02-10 DIAGNOSIS — Z800 Family history of malignant neoplasm of digestive organs: Secondary | ICD-10-CM | POA: Insufficient documentation

## 2020-02-10 DIAGNOSIS — D649 Anemia, unspecified: Secondary | ICD-10-CM | POA: Insufficient documentation

## 2020-02-10 DIAGNOSIS — Z888 Allergy status to other drugs, medicaments and biological substances status: Secondary | ICD-10-CM | POA: Diagnosis not present

## 2020-02-10 DIAGNOSIS — D6489 Other specified anemias: Secondary | ICD-10-CM | POA: Diagnosis not present

## 2020-02-10 DIAGNOSIS — Z8669 Personal history of other diseases of the nervous system and sense organs: Secondary | ICD-10-CM | POA: Diagnosis not present

## 2020-02-10 DIAGNOSIS — Z87891 Personal history of nicotine dependence: Secondary | ICD-10-CM | POA: Insufficient documentation

## 2020-02-10 DIAGNOSIS — Z79899 Other long term (current) drug therapy: Secondary | ICD-10-CM | POA: Insufficient documentation

## 2020-02-10 LAB — CBC WITH DIFFERENTIAL/PLATELET
Abs Immature Granulocytes: 0.03 10*3/uL (ref 0.00–0.07)
Basophils Absolute: 0.1 10*3/uL (ref 0.0–0.1)
Basophils Relative: 1 %
Eosinophils Absolute: 0.3 10*3/uL (ref 0.0–0.5)
Eosinophils Relative: 3 %
HCT: 30.2 % — ABNORMAL LOW (ref 36.0–46.0)
Hemoglobin: 9.4 g/dL — ABNORMAL LOW (ref 12.0–15.0)
Immature Granulocytes: 0 %
Lymphocytes Relative: 48 %
Lymphs Abs: 4.8 10*3/uL — ABNORMAL HIGH (ref 0.7–4.0)
MCH: 28.1 pg (ref 26.0–34.0)
MCHC: 31.1 g/dL (ref 30.0–36.0)
MCV: 90.4 fL (ref 80.0–100.0)
Monocytes Absolute: 0.5 10*3/uL (ref 0.1–1.0)
Monocytes Relative: 5 %
Neutro Abs: 4.3 10*3/uL (ref 1.7–7.7)
Neutrophils Relative %: 43 %
Platelets: 313 10*3/uL (ref 150–400)
RBC: 3.34 MIL/uL — ABNORMAL LOW (ref 3.87–5.11)
RDW: 15.4 % (ref 11.5–15.5)
WBC: 10 10*3/uL (ref 4.0–10.5)
nRBC: 0 % (ref 0.0–0.2)

## 2020-02-10 LAB — PROTIME-INR
INR: 0.9 (ref 0.8–1.2)
Prothrombin Time: 11.3 seconds — ABNORMAL LOW (ref 11.4–15.2)

## 2020-02-10 MED ORDER — FENTANYL CITRATE (PF) 100 MCG/2ML IJ SOLN
INTRAMUSCULAR | Status: AC | PRN
  Administered 2020-02-10 (×2): 50 ug via INTRAVENOUS

## 2020-02-10 MED ORDER — MIDAZOLAM HCL 2 MG/2ML IJ SOLN
INTRAMUSCULAR | Status: AC | PRN
  Administered 2020-02-10 (×2): 1 mg via INTRAVENOUS

## 2020-02-10 MED ORDER — NYSTATIN 100000 UNIT/GM EX CREA: 1.0000 "application " | TOPICAL_CREAM | Freq: Two times a day (BID) | CUTANEOUS | 1 refills | Status: DC

## 2020-02-10 MED ORDER — LIDOCAINE HCL (PF) 1 % IJ SOLN
INTRAMUSCULAR | Status: AC | PRN
  Administered 2020-02-10: 10 mL

## 2020-02-10 MED ORDER — SODIUM CHLORIDE 0.9 % IV SOLN: INTRAVENOUS | Status: DC

## 2020-02-10 MED ORDER — MIDAZOLAM HCL 2 MG/2ML IJ SOLN
INTRAMUSCULAR | Status: AC
  Filled 2020-02-10: qty 4

## 2020-02-10 MED ORDER — FENTANYL CITRATE (PF) 100 MCG/2ML IJ SOLN
INTRAMUSCULAR | Status: AC
  Filled 2020-02-10: qty 2

## 2020-02-10 NOTE — H&P (Signed)
Chief Complaint: Patient was seen in consultation today for bone marrow biopsy at the request of Heilingoetter,Cassandra L  Referring Physician(s): Heilingoetter,Cassandra L  Supervising Physician: Markus Daft  Patient Status: [place]  History of Present Illness: Yolanda Espinoza is a 65 y.o. female being worked up for anemia of unexplained etiology. She is referred for image guided bone marrow biopsy. PMHx, meds, labs, imaging, allergies reviewed. Feels well, no recent fevers, chills, illness. Has been NPO today as directed.    Past Medical History:  Diagnosis Date  . Anemia   . Arthritis   . Chronic diastolic heart failure ([place])   . GERD (gastroesophageal reflux disease)   . Heart murmur   . Hyperlipidemia   . Hypertension may, 1973  . Mitral regurgitation    a. Echo 01/2012 mild LVH, EF 55-65%, Gr 2 DD, mod MR, mild LAE, PASP 36;  b. Echo (02/2013):  EF 55-60%, Gr 1 DD, mild to mod MR, mild LAE (no sig change since prior echo) // c. Echo 10/18: EF 55-60, normal wall motion, grade 2 diastolic dysfunction, trivial AI, mild to moderate MR, mild LAE, trivial PI, PASP 38    . OSA on CPAP    7 yrs  . Peripheral vascular disease ([place])    cerebral aneursym  . Seizures ([place]) 01/2017  . Stroke [facility]) 07   no weakness jan 29th 2019 no defecits  . Subarachnoid hemorrhage due to ruptured aneurysm ([place])    2003 - s/p coiling  . Unspecified vitamin D deficiency     Past Surgical History:  Procedure Laterality Date  . ABDOMINAL HYSTERECTOMY     complete  . ANEURYSM COILING    . COLONOSCOPY WITH PROPOFOL N/A 09/05/2018   Procedure: COLONOSCOPY WITH PROPOFOL;  Surgeon: Juanita Craver, MD;  Location: [facility] ENDOSCOPY;  Service: Endoscopy;  Laterality: N/A;  . ESOPHAGOGASTRODUODENOSCOPY (EGD) WITH PROPOFOL N/A 09/05/2018   Procedure: ESOPHAGOGASTRODUODENOSCOPY (EGD) WITH PROPOFOL;  Surgeon: Juanita Craver, MD;  Location: [facility] ENDOSCOPY;  Service: Endoscopy;  Laterality: N/A;  . GIVENS  CAPSULE STUDY N/A 10/17/2018   Procedure: GIVENS CAPSULE STUDY;  Surgeon: Juanita Craver, MD;  Location: [facility] ENDOSCOPY;  Service: Endoscopy;  Laterality: N/A;  . head surgery     aneurysm ruptured- stroke  . IR 3D INDEPENDENT WKST  09/04/2016  . IR ANGIO INTRA EXTRACRAN SEL COM CAROTID INNOMINATE BILAT MOD SED  09/04/2016  . IR ANGIO INTRA EXTRACRAN SEL INTERNAL CAROTID UNI L MOD SED  02/21/2017  . IR ANGIO VERTEBRAL SEL VERTEBRAL BILAT MOD SED  09/04/2016  . IR ANGIOGRAM EXTREMITY LEFT  09/04/2016  . IR ANGIOGRAM FOLLOW UP STUDY  02/21/2017  . IR NEURO EACH ADD'L AFTER BASIC UNI LEFT (MS)  02/21/2017  . IR RADIOLOGIST EVAL & MGMT  10/11/2016  . IR RADIOLOGIST EVAL & MGMT  03/07/2017  . IR TRANSCATH/EMBOLIZ  02/21/2017  . RADIOLOGY WITH ANESTHESIA N/A 04/23/2013   Procedure: RADIOLOGY WITH ANESTHESIA;  Surgeon: Rob Hickman, MD;  Location: [facility];  Service: Radiology;  Laterality: N/A;  . RADIOLOGY WITH ANESTHESIA N/A 10/30/2016   Procedure: EMBOLIZATION;  Surgeon: Luanne Bras, MD;  Location: [place];  Service: Radiology;  Laterality: N/A;  . RADIOLOGY WITH ANESTHESIA N/A 02/21/2017   Procedure: EMBOLIZATION;  Surgeon: Luanne Bras, MD;  Location: [place];  Service: Radiology;  Laterality: N/A;  . TONSILLECTOMY AND ADENOIDECTOMY    . TUBAL LIGATION      Allergies: Ace inhibitors and Aspirin  Medications: Prior to Admission medications  Medication Sig Start Date End Date Taking? Authorizing Provider  allopurinol (ZYLOPRIM) 100 MG tablet Take     1 tablet    Daily     to Prevent Gout 12/26/19  Yes Liane Comber, NP  amLODipine (NORVASC) 10 MG tablet TAKE 1 TABLET BY MOUTH DAILY FOR BLOOD PRESSURE 01/28/20  Yes Liane Comber, NP  aspirin EC 81 MG tablet Take 1 tablet (81 mg total) by mouth daily. 03/11/15  Yes Nahser, Wonda Cheng, MD  Calcium Carb-Cholecalciferol (CALCIUM 600 + D PO) Take 1 tablet by mouth daily.   Yes [provider]  cholecalciferol (VITAMIN D3) 25 MCG (1000 UT)  tablet Take 1,000 Units by mouth daily.   Yes [provider]  cyclobenzaprine (FLEXERIL) 10 MG tablet TAKE 1 TABLET BY MOUTH EVERY NIGHT AS NEEDED AT BEDTIME FOR MUSCLE SPASMS 01/28/20  Yes Liane Comber, NP  diclofenac (VOLTAREN) 75 MG EC tablet Take 1 tablet    2 x /day    with Food for Pain & Inflammation 01/26/20  Yes Liane Comber, NP  diclofenac sodium (VOLTAREN) 1 % GEL Apply 2 g topically 4 (four) times daily. Right wrist and Right elbow 10/16/17  Yes Kirsteins, Luanna Salk, MD  estrogens, conjugated, (PREMARIN) 0.625 MG tablet Take 1 tablet Daily for Hormones 01/26/20  Yes Corbett, Caryl Pina, NP  famotidine (PEPCID) 20 MG tablet TAKE 1 TABLET BY MOUTH EVERY MORNING 30-60 MINUTES PRIOR TO BREAKFAST FOR REFLUX 01/28/20  Yes Liane Comber, NP  fexofenadine (ALLEGRA ALLERGY) 180 MG tablet Take 1 tablet (180 mg total) by mouth daily. 12/26/19  Yes Liane Comber, NP  furosemide (LASIX) 40 MG tablet Take 1 tablet Daily for BP, Fluid Retention & Ankle Swelling 11/14/18  Yes Unk Pinto, MD  levETIRAcetam (KEPPRA) 1000 MG tablet TAKE ONE (1) TABLET BY MOUTH TWICE DAILY TO PREVENT SEIZURES 01/28/20  Yes Liane Comber, NP  Multiple Vitamins-Minerals (MULTIVITAMIN WITH MINERALS) tablet Take 1 tablet by mouth daily.   Yes [provider]  olmesartan (BENICAR) 40 MG tablet Take 1 tablet Daily for BP 12/26/19  Yes Corbett, Caryl Pina, NP  Omega-3 Fatty Acids (FISH OIL) 1000 MG CAPS Take 1,000 mg by mouth daily.   Yes [provider]  pantoprazole (PROTONIX) 40 MG tablet TAKE 1 TABLET BY MOUTH DAILY IN THE EVENING TO PREVENT INDIGESTION AND HEARTBURN 01/28/20  Yes Liane Comber, NP  ticagrelor (BRILINTA) 90 MG TABS tablet Take 1 tablet 2 x /day to Prevent Blood Clots 12/26/19  Yes Liane Comber, NP  traMADol (ULTRAM) 50 MG tablet 1 tablet as needed up to 3 x a daily for severe pain 11/11/18  Yes Vicie Mutters R, PA-C  vitamin B-12 (CYANOCOBALAMIN) 500 MCG tablet Take 1,000 mcg by mouth  daily.   Yes [provider]  ferrous sulfate 325 (65 FE) MG tablet Take 325 mg by mouth daily with breakfast.    [provider]  gabapentin (NEURONTIN) 300 MG capsule Take 1 capsule (300 mg total) by mouth 3 (three) times daily. 01/26/20   Liane Comber, NP  nystatin (NYSTATIN) powder APPLY TOPICALLY WITH BRUSH TO AREA AFTER A SHOWER TO PREVENT INFECTION 07/18/19   Vladimir Crofts, PA-C  nystatin cream (MYCOSTATIN) Apply 1 application topically 2 (two) times daily. 07/18/19   Vladimir Crofts, PA-C     Family History  Problem Relation Age of Onset  . Hypertension Father   . Kidney disease Father   . Hypertension Sister   . Cancer Sister 70  Colon cancer  . Hypertension Sister   . Hypertension Sister     Social History   Socioeconomic History  . Marital status: Married    Spouse name: Not on file  . Number of children: 2  . Years of education: 18  . Highest education level: Not on file  Occupational History    Comment: [employer], part time  Tobacco Use  . Smoking status: Former Smoker    Packs/day: 0.25    Years: 3.00    Pack years: 0.75    Types: Cigarettes    Quit date: 01/24/1999    Years since quitting: 21.0  . Smokeless tobacco: Never Used  Vaping Use  . Vaping Use: Never used  Substance and Sexual Activity  . Alcohol use: No  . Drug use: No  . Sexual activity: Not Currently    Partners: Male    Birth control/protection: Post-menopausal  Other Topics Concern  . Not on file  Social History Narrative   Lives with husband   Caffeine- maybe 1/2 soda, 1 1/2 cups coffee   Social Determinants of Health   Financial Resource Strain: Not on file  Food Insecurity: Not on file  Transportation Needs: Not on file  Physical Activity: Not on file  Stress: Not on file  Social Connections: Not on file    Review of Systems: A 12 point ROS discussed and pertinent positives are indicated in the HPI above.  All other systems are  negative.  Review of Systems  Vital Signs: BP (!) 172/87   Pulse 87   Temp 97.6 F (36.4 C) (Oral)   Resp 18   Ht '5\' 10"'  (1.778 m)   Wt 97.1 kg   SpO2 99%   BMI 30.71 kg/m   Physical Exam Constitutional:      Appearance: Normal appearance. She is not ill-appearing.  HENT:     Mouth/Throat:     Mouth: Mucous membranes are moist.     Pharynx: Oropharynx is clear.  Cardiovascular:     Rate and Rhythm: Normal rate and regular rhythm.     Heart sounds: Normal heart sounds.  Pulmonary:     Effort: Pulmonary effort is normal. No respiratory distress.     Breath sounds: Normal breath sounds.  Neurological:     General: No focal deficit present.     Mental Status: She is alert and oriented to person, place, and time.  Psychiatric:        Mood and Affect: Mood normal.        Thought Content: Thought content normal.        Judgment: Judgment normal.     Imaging: No results found.  Labs:  CBC: Recent Labs    12/04/19 1223 01/05/20 0943 01/26/20 1453 02/10/20 0730  WBC 8.6 7.0 8.9 10.0  HGB 9.3* 9.7* 9.6* 9.4*  HCT 29.5* 31.1* 30.0* 30.2*  PLT 272 313 310 313    COAGS: Recent Labs    02/10/20 0730  INR 0.9    BMP: Recent Labs    07/18/19 1107 10/27/19 1018 12/04/19 1223 01/26/20 1453  NA 138 136 138 139  K 5.1 5.5* 5.2* 4.6  CL 105 107 107 107  CO2 '27 25 26 23  ' GLUCOSE 75 79 79 66  BUN 21 23 26* 27*  CALCIUM 9.4 9.5 9.2 9.9  CREATININE 1.21* 1.26* 1.47* 1.77*  GFRNONAA 48* 45* 40* 30*  GFRAA 55* 52*  --  35*    LIVER FUNCTION TESTS: Recent Labs  07/18/19 1107 10/27/19 1018 12/04/19 1223 01/26/20 1453  BILITOT 0.3 0.2 0.3 0.2  AST 11 12 13* 15  ALT 6 6 <6 8  ALKPHOS  --   --  61  --   PROT 6.9 7.1 7.4 7.9  ALBUMIN  --   --  3.5  --     TUMOR MARKERS: No results for input(s): AFPTM, CEA, CA199, CHROMGRNA in the last 8760 hours.  Assessment and Plan: Anemia For CT guided bone marrow biopsy. Labs ok. Risks and benefits of bone  marrow bx was discussed with the patient and/or patient's family including, but not limited to bleeding, infection, damage to adjacent structures or low yield requiring additional tests.  All of the questions were answered and there is agreement to proceed.  Consent signed and in chart.    Thank you for this interesting consult.  I greatly enjoyed meeting ADAMA IVINS and look forward to participating in their care.  A copy of this report was sent to the requesting provider on this date.  Electronically Signed: Ascencion Dike, PA-C 02/10/2020, 8:44 AM   I spent a total of 25 minutes in face to face in clinical consultation, greater than 50% of which was counseling/coordinating care for bone marrow bx

## 2020-02-10 NOTE — Discharge Instructions (Signed)
Please call Interventional Radiology clinic [phone] with any questions or concerns.  You may remove your dressing and shower tomorrow.   Bone Marrow Aspiration and Bone Marrow Biopsy, Adult, Care After This sheet gives you information about how to care for yourself after your procedure. Your health care provider may also give you more specific instructions. If you have problems or questions, contact your health care provider. What can I expect after the procedure? After the procedure, it is common to have:  Mild pain and tenderness.  Swelling.  Bruising. Follow these instructions at home: Puncture site care  Follow instructions from your health care provider about how to take care of the puncture site. Make sure you: ? Wash your hands with soap and water before and after you change your bandage (dressing). If soap and water are not available, use hand sanitizer. ? Change your dressing as told by your health care provider.  Check your puncture site every day for signs of infection. Check for: ? More redness, swelling, or pain. ? Fluid or blood. ? Warmth. ? Pus or a bad smell.   Activity  Return to your normal activities as told by your health care provider. Ask your health care provider what activities are safe for you.  Do not lift anything that is heavier than 10 lb (4.5 kg), or the limit that you are told, until your health care provider says that it is safe.  Do not drive for 24 hours if you were given a sedative during your procedure. General instructions  Take over-the-counter and prescription medicines only as told by your health care provider.  Do not take baths, swim, or use a hot tub until your health care provider approves. Ask your health care provider if you may take showers. You may only be allowed to take sponge baths.  If directed, put ice on the affected area. To do this: ? Put ice in a plastic bag. ? Place a towel between your skin and the bag. ? Leave  the ice on for 20 minutes, 2-3 times a day.  Keep all follow-up visits as told by your health care provider. This is important.   Contact a health care provider if:  Your pain is not controlled with medicine.  You have a fever.  You have more redness, swelling, or pain around the puncture site.  You have fluid or blood coming from the puncture site.  Your puncture site feels warm to the touch.  You have pus or a bad smell coming from the puncture site. Summary  After the procedure, it is common to have mild pain, tenderness, swelling, and bruising.  Follow instructions from your health care provider about how to take care of the puncture site and what activities are safe for you.  Take over-the-counter and prescription medicines only as told by your health care provider.  Contact a health care provider if you have any signs of infection, such as fluid or blood coming from the puncture site. This information is not intended to replace advice given to you by your health care provider. Make sure you discuss any questions you have with your health care provider. Document Revised: 05/28/2018 Document Reviewed: 05/28/2018 Elsevier Patient Education  2021 [employer]   Moderate Conscious Sedation, Adult, Care After This sheet gives you information about how to care for yourself after your procedure. Your health care provider may also give you more specific instructions. If you have problems or questions, contact your health care provider. What   can I expect after the procedure? After the procedure, it is common to have:  Sleepiness for several hours.  Impaired judgment for several hours.  Difficulty with balance.  Vomiting if you eat too soon. Follow these instructions at home: For the time period you were told by your health care provider:  Rest.  Do not participate in activities where you could fall or become injured.  Do not drive or use machinery.  Do not drink  alcohol.  Do not take sleeping pills or medicines that cause drowsiness.  Do not make important decisions or sign legal documents.  Do not take care of children on your own.      Eating and drinking  Follow the diet recommended by your health care provider.  Drink enough fluid to keep your urine pale yellow.  If you vomit: ? Drink water, juice, or soup when you can drink without vomiting. ? Make sure you have little or no nausea before eating solid foods.   General instructions  Take over-the-counter and prescription medicines only as told by your health care provider.  Have a responsible adult stay with you for the time you are told. It is important to have someone help care for you until you are awake and alert.  Do not smoke.  Keep all follow-up visits as told by your health care provider. This is important. Contact a health care provider if:  You are still sleepy or having trouble with balance after 24 hours.  You feel light-headed.  You keep feeling nauseous or you keep vomiting.  You develop a rash.  You have a fever.  You have redness or swelling around the IV site. Get help right away if:  You have trouble breathing.  You have new-onset confusion at home. Summary  After the procedure, it is common to feel sleepy, have impaired judgment, or feel nauseous if you eat too soon.  Rest after you get home. Know the things you should not do after the procedure.  Follow the diet recommended by your health care provider and drink enough fluid to keep your urine pale yellow.  Get help right away if you have trouble breathing or new-onset confusion at home. This information is not intended to replace advice given to you by your health care provider. Make sure you discuss any questions you have with your health care provider. Document Revised: 05/09/2019 Document Reviewed: 12/05/2018 Elsevier Patient Education  2021 [employer] Inc.  

## 2020-02-10 NOTE — Procedures (Signed)
Interventional Radiology Procedure:   Indications: Anemia  Procedure: CT guided bone marrow biopsy  Findings: 2 aspirates and 1 core from right ilium  Complications: None     EBL: Minimal, less than 10 ml  Plan: Discharge to home in one hour.   Canaan Prue R. Dmiyah Liscano, MD  Pager: [phone]    

## 2020-02-11 ENCOUNTER — Encounter: Payer: Self-pay | Admitting: Cardiovascular Disease

## 2020-02-11 ENCOUNTER — Other Ambulatory Visit: Payer: BC Managed Care – PPO

## 2020-02-11 ENCOUNTER — Other Ambulatory Visit: Payer: Self-pay | Admitting: Adult Health

## 2020-02-11 DIAGNOSIS — N289 Disorder of kidney and ureter, unspecified: Secondary | ICD-10-CM

## 2020-02-11 LAB — SURGICAL PATHOLOGY

## 2020-02-11 NOTE — Progress Notes (Signed)
Cardiology Office Note   Date:  02/12/2020   ID:  Yolanda Espinoza, Yolanda Espinoza [birth year], MRN [id number]  PCP:  Liane Comber, NP  Cardiologist:   Mertie Moores, MD   Chief Complaint  Patient presents with  . Congestive Heart Failure  . Hypertension   1. hypertension 2. Heart murmur 3. Cerebral aneurysm     Yolanda Espinoza is a 65 year old female with a history of hypertension and a cerebral aneurysm. She was to have a heart murmur and was referred here for further evaluation. She also has been having some shortness of breath. She exercises for 30 minutes every day typically does not have much shortness breath when she is exercising. She seems to have shortness of breath at other times. She denies any syncope or presyncope. She denies any chest pain.  She works at [employer]. She does also helps the seniors with activities.  Hx of blood pressure on occasion. Her blood pressure readings have been normal.   Feb. 17, 2016:   Yolanda Espinoza is a 65 y.o. female who presents for follow up of her HTN She is dong well.  She has had the cerebral coils removed and had another procedure. Headaches have resolved. BP is well controlled.  Getting some exercise.  Still working at [employer].  Feb. 16, 2017: Doing well.  BP has been well controlled.   Has lost some weight.   Exercising   April 07, 2016:    Nephateria is seen back.   Has had some leg swelling  ( is on Amlodipine )  Some dyspnea.   August 04, 2019:  Yolanda Espinoza is seen back today for follow up of her HTN Had a cerebral aneurism in Jan.   Associated with a CVA and seizures.  Doing well from a cardiac standpoint   Jan. 20, 2022: Yolanda Espinoza is seen back today for hx of HTN, cerebral aneurism. Had a bone marrow bx recently .   Due to iron deficiency anemia     Past Medical History:  Diagnosis Date  . Anemia   . Arthritis   . Chronic diastolic heart failure ([place])   . GERD (gastroesophageal reflux disease)   .  Heart murmur   . Hyperlipidemia   . Hypertension may, 1973  . Mitral regurgitation    a. Echo 01/2012 mild LVH, EF 55-65%, Gr 2 DD, mod MR, mild LAE, PASP 36;  b. Echo (02/2013):  EF 55-60%, Gr 1 DD, mild to mod MR, mild LAE (no sig change since prior echo) // c. Echo 10/18: EF 55-60, normal wall motion, grade 2 diastolic dysfunction, trivial AI, mild to moderate MR, mild LAE, trivial PI, PASP 38    . OSA on CPAP    7 yrs  . Peripheral vascular disease ([place])    cerebral aneursym  . Seizures ([place]) 01/2017  . Stroke [facility]) 07   no weakness jan 29th 2019 no defecits  . Subarachnoid hemorrhage due to ruptured aneurysm ([place])    2003 - s/p coiling  . Unspecified vitamin D deficiency     Past Surgical History:  Procedure Laterality Date  . ABDOMINAL HYSTERECTOMY     complete  . ANEURYSM COILING    . COLONOSCOPY WITH PROPOFOL N/A 09/05/2018   Procedure: COLONOSCOPY WITH PROPOFOL;  Surgeon: Juanita Craver, MD;  Location: [facility] ENDOSCOPY;  Service: Endoscopy;  Laterality: N/A;  . ESOPHAGOGASTRODUODENOSCOPY (EGD) WITH PROPOFOL N/A 09/05/2018   Procedure: ESOPHAGOGASTRODUODENOSCOPY (EGD) WITH PROPOFOL;  Surgeon: Juanita Craver, MD;  [facility];  Service: Endoscopy;  Laterality: N/A;  . GIVENS CAPSULE STUDY N/A 10/17/2018   Procedure: GIVENS CAPSULE STUDY;  Surgeon: Juanita Craver, MD;  Location: [facility] ENDOSCOPY;  Service: Endoscopy;  Laterality: N/A;  . head surgery     aneurysm ruptured- stroke  . IR 3D INDEPENDENT WKST  09/04/2016  . IR ANGIO INTRA EXTRACRAN SEL COM CAROTID INNOMINATE BILAT MOD SED  09/04/2016  . IR ANGIO INTRA EXTRACRAN SEL INTERNAL CAROTID UNI L MOD SED  02/21/2017  . IR ANGIO VERTEBRAL SEL VERTEBRAL BILAT MOD SED  09/04/2016  . IR ANGIOGRAM EXTREMITY LEFT  09/04/2016  . IR ANGIOGRAM FOLLOW UP STUDY  02/21/2017  . IR NEURO EACH ADD'L AFTER BASIC UNI LEFT (MS)  02/21/2017  . IR RADIOLOGIST EVAL & MGMT  10/11/2016  . IR RADIOLOGIST EVAL & MGMT  03/07/2017  . IR TRANSCATH/EMBOLIZ   02/21/2017  . RADIOLOGY WITH ANESTHESIA N/A 04/23/2013   Procedure: RADIOLOGY WITH ANESTHESIA;  Surgeon: Rob Hickman, MD;  Location: [facility];  Service: Radiology;  Laterality: N/A;  . RADIOLOGY WITH ANESTHESIA N/A 10/30/2016   Procedure: EMBOLIZATION;  Surgeon: Luanne Bras, MD;  Location: [place];  Service: Radiology;  Laterality: N/A;  . RADIOLOGY WITH ANESTHESIA N/A 02/21/2017   Procedure: EMBOLIZATION;  Surgeon: Luanne Bras, MD;  Location: [place];  Service: Radiology;  Laterality: N/A;  . TONSILLECTOMY AND ADENOIDECTOMY    . TUBAL LIGATION       Current Outpatient Medications  Medication Sig Dispense Refill  . allopurinol (ZYLOPRIM) 100 MG tablet Take     1 tablet    Daily     to Prevent Gout 90 tablet 0  . amLODipine (NORVASC) 10 MG tablet TAKE 1 TABLET BY MOUTH DAILY FOR BLOOD PRESSURE 90 tablet 1  . aspirin EC 81 MG tablet Take 1 tablet (81 mg total) by mouth daily.    . Calcium Carb-Cholecalciferol (CALCIUM 600 + D PO) Take 1 tablet by mouth daily.    . cholecalciferol (VITAMIN D3) 25 MCG (1000 UT) tablet Take 1,000 Units by mouth daily.    . cyclobenzaprine (FLEXERIL) 10 MG tablet TAKE 1 TABLET BY MOUTH EVERY NIGHT AS NEEDED AT BEDTIME FOR MUSCLE SPASMS 90 tablet 0  . diclofenac (VOLTAREN) 75 MG EC tablet Take 1 tablet    2 x /day    with Food for Pain & Inflammation 180 tablet 0  . diclofenac sodium (VOLTAREN) 1 % GEL Apply 2 g topically 4 (four) times daily. Right wrist and Right elbow 3 Tube 2  . estrogens, conjugated, (PREMARIN) 0.625 MG tablet Take 1 tablet Daily for Hormones 90 tablet 0  . famotidine (PEPCID) 20 MG tablet TAKE 1 TABLET BY MOUTH EVERY MORNING 30-60 MINUTES PRIOR TO BREAKFAST FOR REFLUX 90 tablet 0  . ferrous sulfate 325 (65 FE) MG tablet Take 325 mg by mouth daily with breakfast.    . fexofenadine (ALLEGRA ALLERGY) 180 MG tablet Take 1 tablet (180 mg total) by mouth daily. 30 tablet 2  . furosemide (LASIX) 40 MG tablet Take 1 tablet Daily for BP,  Fluid Retention & Ankle Swelling 90 tablet 1  . levETIRAcetam (KEPPRA) 1000 MG tablet TAKE ONE (1) TABLET BY MOUTH TWICE DAILY TO PREVENT SEIZURES 180 tablet 1  . Multiple Vitamins-Minerals (MULTIVITAMIN WITH MINERALS) tablet Take 1 tablet by mouth daily.    Marland Kitchen nystatin (NYSTATIN) powder APPLY TOPICALLY WITH BRUSH TO AREA AFTER A SHOWER TO PREVENT INFECTION 30 g 0  . nystatin cream (MYCOSTATIN)  Apply 1 application topically 2 (two) times daily. Apply    2 x /day    to rash 30 g 1  . olmesartan (BENICAR) 40 MG tablet Take 1 tablet Daily for BP 90 tablet 0  . Omega-3 Fatty Acids (FISH OIL) 1000 MG CAPS Take 1,000 mg by mouth daily.    . pantoprazole (PROTONIX) 40 MG tablet TAKE 1 TABLET BY MOUTH DAILY IN THE EVENING TO PREVENT INDIGESTION AND HEARTBURN 90 tablet 0  . ticagrelor (BRILINTA) 90 MG TABS tablet Take 1 tablet 2 x /day to Prevent Blood Clots 180 tablet 1  . traMADol (ULTRAM) 50 MG tablet 1 tablet as needed up to 3 x a daily for severe pain 15 tablet 0  . vitamin B-12 (CYANOCOBALAMIN) 500 MCG tablet Take 1,000 mcg by mouth daily.     No current facility-administered medications for this visit.    Allergies:   Ace inhibitors and Aspirin    Social History:  The patient  reports that she quit smoking about 21 years ago. Her smoking use included cigarettes. She has a 0.75 pack-year smoking history. She has never used smokeless tobacco. She reports that she does not drink alcohol and does not use drugs.   Family History:  The patient's family history includes Cancer (age of onset: 8) in her sister; Hypertension in her father, sister, sister, and sister; Kidney disease in her father.    ROS:  Please see the history of present illness.  All other systems are negative      Physical Exam: Blood pressure 130/84, pulse 88, height '5\' 10"'$  (1.778 m), weight 219 lb (99.3 kg), SpO2 99 %.  GEN:  Well nourished, well developed in no acute distress HEENT: Normal NECK: No JVD; No carotid  bruits LYMPHATICS: No lymphadenopathy CARDIAC: RRR,  2/6 systolic murmur   RESPIRATORY:  Clear to auscultation without rales, wheezing or rhonchi  ABDOMEN: Soft, non-tender, non-distended MUSCULOSKELETAL:  No edema; No deformity  SKIN: Warm and dry NEUROLOGIC:  Alert and oriented x 3   EKG:    Recent Labs: 01/26/2020: ALT 8; Magnesium 1.9; TSH 4.78 02/10/2020: Hemoglobin 9.4; Platelets 313 02/11/2020: BUN 21; Creat 1.46; Potassium 6.1; Sodium 139    Lipid Panel    Component Value Date/Time   CHOL 215 (H) 01/26/2020 1453   TRIG 379 (H) 01/26/2020 1453   HDL 106 01/26/2020 1453   CHOLHDL 2.0 01/26/2020 1453   VLDL 11 10/26/2015 0925   LDLCALC 63 01/26/2020 1453      Wt Readings from Last 3 Encounters:  02/12/20 219 lb (99.3 kg)  02/10/20 214 lb (97.1 kg)  01/26/20 218 lb (98.9 kg)      Other studies Reviewed: Additional studies/ records that were reviewed today include: . Review of the above records demonstrates:    ASSESSMENT AND PLAN:  1. Hypertension -  BP is well controlled.   She is very stable  Will have her follow up in 1 year with Dr. Johney Frame .   2. Mitral regurgitation -  Has a soft systolic murmur.  Stable   3. Cerebral aneurysm-       Current medicines are reviewed at length with the patient today.  The patient does not have concerns regarding medicines.  The following changes have been made:  no change   Disposition:    With Dr. Johney Frame in 1 year    Signed, Mertie Moores, MD  02/12/2020 10:50 AM    [address],  , [place]  [address] Phone: [phone]; Fax: [phone]  

## 2020-02-12 ENCOUNTER — Other Ambulatory Visit: Payer: Self-pay

## 2020-02-12 ENCOUNTER — Encounter: Payer: Self-pay | Admitting: Cardiovascular Disease

## 2020-02-12 ENCOUNTER — Ambulatory Visit: Payer: BC Managed Care – PPO | Admitting: Cardiovascular Disease

## 2020-02-12 VITALS — BP 130/84 | HR 88 | Ht 70.0 in | Wt 219.0 lb

## 2020-02-12 DIAGNOSIS — I10 Essential (primary) hypertension: Secondary | ICD-10-CM | POA: Diagnosis not present

## 2020-02-12 DIAGNOSIS — I340 Nonrheumatic mitral (valve) insufficiency: Secondary | ICD-10-CM

## 2020-02-12 LAB — BASIC METABOLIC PANEL WITH GFR
BUN/Creatinine Ratio: 14 (calc) (ref 6–22)
BUN: 21 mg/dL (ref 7–25)
CO2: 26 mmol/L (ref 20–32)
Calcium: 9.5 mg/dL (ref 8.6–10.4)
Chloride: 108 mmol/L (ref 98–110)
Creat: 1.46 mg/dL — ABNORMAL HIGH (ref 0.50–0.99)
GFR, Est African American: 44 mL/min/{1.73_m2} — ABNORMAL LOW (ref >=60)
GFR, Est Non African American: 38 mL/min/{1.73_m2} — ABNORMAL LOW (ref >=60)
Glucose, Bld: 79 mg/dL (ref 65–99)
Potassium: 6.1 mmol/L — ABNORMAL HIGH (ref 3.5–5.3)
Sodium: 139 mmol/L (ref 135–146)

## 2020-02-12 NOTE — Patient Instructions (Signed)
Medication Instructions:  Your physician recommends that you continue on your current medications as directed. Please refer to the Current Medication list given to you today.  *If you need a refill on your cardiac medications before your next appointment, please call your pharmacy*   Lab Work: None If you have labs (blood work) drawn today and your tests are completely normal, you will receive your results only by: Marland Kitchen MyChart Message (if you have MyChart) OR . A paper copy in the mail If you have any lab test that is abnormal or we need to change your treatment, we will call you to review the results.   Testing/Procedures: None   Follow-Up: At [facility], you and your health needs are our priority.  As part of our continuing mission to provide you with exceptional heart care, we have created designated Provider Care Teams.  These Care Teams include your primary Cardiologist (physician) and Advanced Practice Providers (APPs -  Physician Assistants and Nurse Practitioners) who all work together to provide you with the care you need, when you need it.  We recommend signing up for the patient portal called "MyChart".  Sign up information is provided on this After Visit Summary.  MyChart is used to connect with patients for Virtual Visits (Telemedicine).  Patients are able to view lab/test results, encounter notes, upcoming appointments, etc.  Non-urgent messages can be sent to your provider as well.   To learn more about what you can do with MyChart, go to [email or web address].    Your next appointment:   1 year(s)  The format for your next appointment:   In Person  Provider:   You may see Gwyndolyn Kaufman, MD or one of the following Advanced Practice Providers on your designated Care Team:    Richardson Dopp, PA-C  Robbie Lis, [place]    Other Instructions

## 2020-02-17 ENCOUNTER — Other Ambulatory Visit: Payer: Self-pay | Admitting: Physician Assistant

## 2020-02-17 DIAGNOSIS — D649 Anemia, unspecified: Secondary | ICD-10-CM

## 2020-02-18 ENCOUNTER — Inpatient Hospital Stay (HOSPITAL_BASED_OUTPATIENT_CLINIC_OR_DEPARTMENT_OTHER): Payer: BC Managed Care – PPO | Admitting: Internal Medicine

## 2020-02-18 ENCOUNTER — Other Ambulatory Visit: Payer: Self-pay

## 2020-02-18 ENCOUNTER — Encounter: Payer: Self-pay | Admitting: Internal Medicine

## 2020-02-18 ENCOUNTER — Encounter (HOSPITAL_COMMUNITY): Payer: Self-pay | Admitting: Internal Medicine

## 2020-02-18 ENCOUNTER — Inpatient Hospital Stay: Payer: BC Managed Care – PPO | Attending: Physician Assistant

## 2020-02-18 VITALS — BP 167/90 | HR 66 | Temp 97.0°F | Resp 18 | Ht 70.0 in | Wt 213.7 lb

## 2020-02-18 DIAGNOSIS — N189 Chronic kidney disease, unspecified: Secondary | ICD-10-CM | POA: Insufficient documentation

## 2020-02-18 DIAGNOSIS — Z862 Personal history of diseases of the blood and blood-forming organs and certain disorders involving the immune mechanism: Secondary | ICD-10-CM | POA: Diagnosis not present

## 2020-02-18 DIAGNOSIS — N1832 Chronic kidney disease, stage 3b: Secondary | ICD-10-CM

## 2020-02-18 DIAGNOSIS — I10 Essential (primary) hypertension: Secondary | ICD-10-CM | POA: Diagnosis not present

## 2020-02-18 DIAGNOSIS — D631 Anemia in chronic kidney disease: Secondary | ICD-10-CM | POA: Insufficient documentation

## 2020-02-18 DIAGNOSIS — D649 Anemia, unspecified: Secondary | ICD-10-CM

## 2020-02-18 LAB — CMP (CANCER CENTER ONLY)
ALT: 7 U/L (ref 0–44)
AST: 14 U/L — ABNORMAL LOW (ref 15–41)
Albumin: 3.8 g/dL (ref 3.5–5.0)
Alkaline Phosphatase: 50 U/L (ref 38–126)
Anion gap: 6 (ref 5–15)
BUN: 21 mg/dL (ref 8–23)
CO2: 23 mmol/L (ref 22–32)
Calcium: 9.5 mg/dL (ref 8.9–10.3)
Chloride: 108 mmol/L (ref 98–111)
Creatinine: 1.48 mg/dL — ABNORMAL HIGH (ref 0.44–1.00)
GFR, Estimated: 39 mL/min — ABNORMAL LOW (ref >60)
Glucose, Bld: 78 mg/dL (ref 70–99)
Potassium: 4.9 mmol/L (ref 3.5–5.1)
Sodium: 137 mmol/L (ref 135–145)
Total Bilirubin: 0.2 mg/dL — ABNORMAL LOW (ref 0.3–1.2)
Total Protein: 7.9 g/dL (ref 6.5–8.1)

## 2020-02-18 LAB — CBC WITH DIFFERENTIAL (CANCER CENTER ONLY)
Abs Immature Granulocytes: 0.01 10*3/uL (ref 0.00–0.07)
Basophils Absolute: 0.1 10*3/uL (ref 0.0–0.1)
Basophils Relative: 1 %
Eosinophils Absolute: 0.3 10*3/uL (ref 0.0–0.5)
Eosinophils Relative: 4 %
HCT: 29.5 % — ABNORMAL LOW (ref 36.0–46.0)
Hemoglobin: 9.2 g/dL — ABNORMAL LOW (ref 12.0–15.0)
Immature Granulocytes: 0 %
Lymphocytes Relative: 51 %
Lymphs Abs: 3.6 10*3/uL (ref 0.7–4.0)
MCH: 28 pg (ref 26.0–34.0)
MCHC: 31.2 g/dL (ref 30.0–36.0)
MCV: 89.7 fL (ref 80.0–100.0)
Monocytes Absolute: 0.5 10*3/uL (ref 0.1–1.0)
Monocytes Relative: 6 %
Neutro Abs: 2.7 10*3/uL (ref 1.7–7.7)
Neutrophils Relative %: 38 %
Platelet Count: 282 10*3/uL (ref 150–400)
RBC: 3.29 MIL/uL — ABNORMAL LOW (ref 3.87–5.11)
RDW: 15.2 % (ref 11.5–15.5)
WBC Count: 7 10*3/uL (ref 4.0–10.5)
nRBC: 0 % (ref 0.0–0.2)

## 2020-02-18 NOTE — Progress Notes (Signed)
Yolanda Espinoza Telephone:([phone]   [phone]  OFFICE PROGRESS NOTE  Liane Comber, NP [address] [place] [address]  DIAGNOSIS: Anemia of chronic disease/iron deficiency.  PRIOR THERAPY: None  CURRENT THERAPY: Over-Yolanda-counter ferrous sulfate once daily.  INTERVAL HISTORY: Yolanda Espinoza 65 y.o. female returns to Yolanda clinic today for follow-up visit.  Yolanda Espinoza is feeling fine today with no concerning complaints except for mild fatigue.  She underwent a bone marrow biopsy and aspirate recently.  She denied having any current chest pain, shortness of breath, cough or hemoptysis.  She denied having any nausea, vomiting, diarrhea or constipation.  She did not take her blood pressure medication earlier today.  Yolanda Espinoza had several studies for evaluation of her anemia that were unremarkable.  She is here today for evaluation and discussion of her lab and biopsy results.  MEDICAL HISTORY: Past Medical History:  Diagnosis Date  . Anemia   . Arthritis   . Chronic diastolic heart failure ([place])   . GERD (gastroesophageal reflux disease)   . Heart murmur   . Hyperlipidemia   . Hypertension may, 1973  . Mitral regurgitation    a. Echo 01/2012 mild LVH, EF 55-65%, Gr 2 DD, mod MR, mild LAE, PASP 36;  b. Echo (02/2013):  EF 55-60%, Gr 1 DD, mild to mod MR, mild LAE (no sig change since prior echo) // c. Echo 10/18: EF 55-60, normal wall motion, grade 2 diastolic dysfunction, trivial AI, mild to moderate MR, mild LAE, trivial PI, PASP 38    . OSA on CPAP    7 yrs  . Peripheral vascular disease ([place])    cerebral aneursym  . Seizures ([place]) 01/2017  . Stroke [facility]) 07   no weakness jan 29th 2019 no defecits  . Subarachnoid hemorrhage due to ruptured aneurysm ([place])    2003 - s/p coiling  . Unspecified vitamin D deficiency     ALLERGIES:  is allergic to ace inhibitors and aspirin.  MEDICATIONS:  Current Outpatient Medications   Medication Sig Dispense Refill  . allopurinol (ZYLOPRIM) 100 MG tablet Take     1 tablet    Daily     to Prevent Gout 90 tablet 0  . amLODipine (NORVASC) 10 MG tablet TAKE 1 TABLET BY MOUTH DAILY FOR BLOOD PRESSURE 90 tablet 1  . aspirin EC 81 MG tablet Take 1 tablet (81 mg total) by mouth daily.    . Calcium Carb-Cholecalciferol (CALCIUM 600 + D PO) Take 1 tablet by mouth daily.    . cholecalciferol (VITAMIN D3) 25 MCG (1000 UT) tablet Take 1,000 Units by mouth daily.    . cyclobenzaprine (FLEXERIL) 10 MG tablet TAKE 1 TABLET BY MOUTH EVERY NIGHT AS NEEDED AT BEDTIME FOR MUSCLE SPASMS 90 tablet 0  . diclofenac (VOLTAREN) 75 MG EC tablet Take 1 tablet    2 x /day    with Food for Pain & Inflammation 180 tablet 0  . diclofenac sodium (VOLTAREN) 1 % GEL Apply 2 g topically 4 (four) times daily. Right wrist and Right elbow 3 Tube 2  . estrogens, conjugated, (PREMARIN) 0.625 MG tablet Take 1 tablet Daily for Hormones 90 tablet 0  . famotidine (PEPCID) 20 MG tablet TAKE 1 TABLET BY MOUTH EVERY MORNING 30-60 MINUTES PRIOR TO BREAKFAST FOR REFLUX 90 tablet 0  . ferrous sulfate 325 (65 FE) MG tablet Take 325 mg by mouth daily with breakfast.    . fexofenadine (  ALLEGRA ALLERGY) 180 MG tablet Take 1 tablet (180 mg total) by mouth daily. 30 tablet 2  . furosemide (LASIX) 40 MG tablet Take 1 tablet Daily for BP, Fluid Retention & Ankle Swelling 90 tablet 1  . levETIRAcetam (KEPPRA) 1000 MG tablet TAKE ONE (1) TABLET BY MOUTH TWICE DAILY TO PREVENT SEIZURES 180 tablet 1  . Multiple Vitamins-Minerals (MULTIVITAMIN WITH MINERALS) tablet Take 1 tablet by mouth daily.    Marland Kitchen nystatin (NYSTATIN) powder APPLY TOPICALLY WITH BRUSH TO AREA AFTER A SHOWER TO PREVENT INFECTION 30 g 0  . nystatin cream (MYCOSTATIN) Apply 1 application topically 2 (two) times daily. Apply    2 x /day    to rash 30 g 1  . olmesartan (BENICAR) 40 MG tablet Take 1 tablet Daily for BP 90 tablet 0  . Omega-3 Fatty Acids (FISH OIL) 1000 MG CAPS  Take 1,000 mg by mouth daily.    . pantoprazole (PROTONIX) 40 MG tablet TAKE 1 TABLET BY MOUTH DAILY IN Yolanda EVENING TO PREVENT INDIGESTION AND HEARTBURN 90 tablet 0  . ticagrelor (BRILINTA) 90 MG TABS tablet Take 1 tablet 2 x /day to Prevent Blood Clots 180 tablet 1  . traMADol (ULTRAM) 50 MG tablet 1 tablet as needed up to 3 x a daily for severe pain 15 tablet 0  . vitamin B-12 (CYANOCOBALAMIN) 500 MCG tablet Take 1,000 mcg by mouth daily.     No current facility-administered medications for this visit.    SURGICAL HISTORY:  Past Surgical History:  Procedure Laterality Date  . ABDOMINAL HYSTERECTOMY     complete  . ANEURYSM COILING    . COLONOSCOPY WITH PROPOFOL N/A 09/05/2018   Procedure: COLONOSCOPY WITH PROPOFOL;  Surgeon: Juanita Craver, MD;  [facility] ENDOS[facility];  Service: Endoscopy;  Laterality: N/A;  . ESOPHAGOGASTRODUODENOSCOPY (EGD) WITH PROPOFOL N/A 09/05/2018   Procedure: ESOPHAGOGASTRODUODENOSCOPY (EGD) WITH PROPOFOL;  Surgeon: Juanita Craver, MD;  [facility];  Service: Endoscopy;  Laterality: N/A;  . GIVENS CAPSULE STUDY N/A 10/17/2018   Procedure: GIVENS CAPSULE STUDY;  Surgeon: Juanita Craver, MD;  Location: [facility] ENDOSCOPY;  Service: Endoscopy;  Laterality: N/A;  . head surgery     aneurysm ruptured- stroke  . IR 3D INDEPENDENT WKST  09/04/2016  . IR ANGIO INTRA EXTRACRAN SEL COM CAROTID INNOMINATE BILAT MOD SED  09/04/2016  . IR ANGIO INTRA EXTRACRAN SEL INTERNAL CAROTID UNI L MOD SED  02/21/2017  . IR ANGIO VERTEBRAL SEL VERTEBRAL BILAT MOD SED  09/04/2016  . IR ANGIOGRAM EXTREMITY LEFT  09/04/2016  . IR ANGIOGRAM FOLLOW UP STUDY  02/21/2017  . IR NEURO EACH ADD'L AFTER BASIC UNI LEFT (MS)  02/21/2017  . IR RADIOLOGIST EVAL & MGMT  10/11/2016  . IR RADIOLOGIST EVAL & MGMT  03/07/2017  . IR TRANSCATH/EMBOLIZ  02/21/2017  . RADIOLOGY WITH ANESTHESIA N/A 04/23/2013   Procedure: RADIOLOGY WITH ANESTHESIA;  Surgeon: Rob Hickman, MD;  Location: [place];  Service: Radiology;   Laterality: N/A;  . RADIOLOGY WITH ANESTHESIA N/A 10/30/2016   Procedure: EMBOLIZATION;  Surgeon: Luanne Bras, MD;  Location: [place];  Service: Radiology;  Laterality: N/A;  . RADIOLOGY WITH ANESTHESIA N/A 02/21/2017   Procedure: EMBOLIZATION;  Surgeon: Luanne Bras, MD;  Location: [place];  Service: Radiology;  Laterality: N/A;  . TONSILLECTOMY AND ADENOIDECTOMY    . TUBAL LIGATION      REVIEW OF SYSTEMS:  A comprehensive review of systems was negative except for: Constitutional: positive for fatigue   PHYSICAL EXAMINATION: General  appearance: alert, cooperative, fatigued and no distress Head: Normocephalic, without obvious abnormality, atraumatic Neck: no adenopathy, no JVD, supple, symmetrical, trachea midline and thyroid not enlarged, symmetric, no tenderness/mass/nodules Lymph nodes: Cervical, supraclavicular, and axillary nodes normal. Resp: clear to auscultation bilaterally Back: symmetric, no curvature. ROM normal. No CVA tenderness. Cardio: regular rate and rhythm, S1, S2 normal, no murmur, click, rub or gallop GI: soft, non-tender; bowel sounds normal; no masses,  no organomegaly Extremities: extremities normal, atraumatic, no cyanosis or edema  ECOG PERFORMANCE STATUS: 1 - Symptomatic but completely ambulatory  Blood pressure (!) 167/90, pulse 66, temperature (!) 97 F (36.1 C), temperature source Tympanic, resp. rate 18, height '5\' 10"'  (1.778 m), weight 213 lb 11.2 oz (96.9 kg), SpO2 100 %.  LABORATORY DATA: Lab Results  Component Value Date   WBC 7.0 02/18/2020   HGB 9.2 (L) 02/18/2020   HCT 29.5 (L) 02/18/2020   MCV 89.7 02/18/2020   PLT 282 02/18/2020      Chemistry      Component Value Date/Time   NA 137 02/18/2020 0854   NA 140 11/08/2016 1149   K 4.9 02/18/2020 0854   CL 108 02/18/2020 0854   CO2 23 02/18/2020 0854   BUN 21 02/18/2020 0854   BUN 12 11/08/2016 1149   CREATININE 1.48 (H) 02/18/2020 0854   CREATININE 1.46 (H) 02/11/2020 0900       Component Value Date/Time   CALCIUM 9.5 02/18/2020 0854   ALKPHOS 50 02/18/2020 0854   AST 14 (L) 02/18/2020 0854   ALT 7 02/18/2020 0854   BILITOT 0.2 (L) 02/18/2020 0854       RADIOGRAPHIC STUDIES: CT Biopsy  Result Date: Feb 22, 2020 INDICATION: 65 year old with anemia. EXAM: CT GUIDED BONE MARROW ASPIRATES AND BIOPSY Physician: Stephan Minister. Anselm Pancoast, MD MEDICATIONS: None. ANESTHESIA/SEDATION: Fentanyl 100 mcg IV; Versed 2.0 mg IV Moderate Sedation Time:  10 minutes Yolanda Espinoza was continuously monitored during Yolanda procedure by Yolanda interventional radiology nurse under my direct supervision. COMPLICATIONS: None immediate. PROCEDURE: Yolanda procedure was explained to Yolanda Espinoza. Yolanda risks and benefits of Yolanda procedure were discussed and Yolanda Espinoza's questions were addressed. Informed consent was obtained from Yolanda Espinoza. Yolanda Espinoza was placed prone on CT table. Images of Yolanda pelvis were obtained. Yolanda right side of back was prepped and draped in sterile fashion. Yolanda skin and right posterior ilium were anesthetized with 1% lidocaine. 11 gauge bone needle was directed into Yolanda right ilium with CT guidance. Two aspirates and one core biopsy were obtained. Bandage placed over Yolanda puncture site. IMPRESSION: CT guided bone marrow aspiration and core biopsy. Electronically Signed   By: Markus Daft M.D.   On: 02/22/20 16:28   CT BONE MARROW BIOPSY & ASPIRATION  Result Date: February 22, 2020 INDICATION: 65 year old with anemia. EXAM: CT GUIDED BONE MARROW ASPIRATES AND BIOPSY Physician: Stephan Minister. Anselm Pancoast, MD MEDICATIONS: None. ANESTHESIA/SEDATION: Fentanyl 100 mcg IV; Versed 2.0 mg IV Moderate Sedation Time:  10 minutes Yolanda Espinoza was continuously monitored during Yolanda procedure by Yolanda interventional radiology nurse under my direct supervision. COMPLICATIONS: None immediate. PROCEDURE: Yolanda procedure was explained to Yolanda Espinoza. Yolanda risks and benefits of Yolanda procedure were discussed and Yolanda Espinoza's questions were addressed.  Informed consent was obtained from Yolanda Espinoza. Yolanda Espinoza was placed prone on CT table. Images of Yolanda pelvis were obtained. Yolanda right side of back was prepped and draped in sterile fashion. Yolanda skin and right posterior ilium were anesthetized with 1% lidocaine. 11 gauge bone needle was directed  into Yolanda right ilium with CT guidance. Two aspirates and one core biopsy were obtained. Bandage placed over Yolanda puncture site. IMPRESSION: CT guided bone marrow aspiration and core biopsy. Electronically Signed   By: Markus Daft M.D.   On: 02/10/2020 16:28    ASSESSMENT AND PLAN: This is a very pleasant 65 years old African-American female with persistent anemia likely anemia of chronic disease secondary to renal insufficiency.  Yolanda Espinoza underwent several investigation to identify Yolanda etiology of her anemia and these were unremarkable.  She also had a bone marrow biopsy and aspirate that was unremarkable and showed nonspecific findings. I discussed Yolanda lab and Yolanda biopsy results with Yolanda Espinoza today and recommended for her to continue on observation and close monitoring by her primary care physician. I will see Yolanda Espinoza on as-needed basis at this point. Yolanda Espinoza was advised to call immediately if she has any other concerning symptoms. Yolanda Espinoza voices understanding of current disease status and treatment options and is in agreement with Yolanda current care plan.  All questions were answered. Yolanda Espinoza knows to call Yolanda clinic with any problems, questions or concerns. We can certainly see Yolanda Espinoza much sooner if necessary.   Disclaimer: This note was dictated with voice recognition software. Similar sounding words can inadvertently be transcribed and may not be corrected upon review.

## 2020-02-27 LAB — SURGICAL PATHOLOGY

## 2020-03-01 ENCOUNTER — Institutional Professional Consult (permissible substitution): Payer: Self-pay | Admitting: Neurology

## 2020-03-01 ENCOUNTER — Encounter: Payer: Self-pay | Admitting: Neurology

## 2020-03-21 ENCOUNTER — Emergency Department (HOSPITAL_BASED_OUTPATIENT_CLINIC_OR_DEPARTMENT_OTHER): Payer: BC Managed Care – PPO

## 2020-03-21 ENCOUNTER — Emergency Department (HOSPITAL_COMMUNITY)
Admission: EM | Admit: 2020-03-21 | Discharge: 2020-03-21 | Disposition: A | Discharge status: Home / Self Care | Payer: BC Managed Care – PPO | Attending: Emergency Medicine | Admitting: Emergency Medicine

## 2020-03-21 ENCOUNTER — Other Ambulatory Visit: Payer: Self-pay

## 2020-03-21 ENCOUNTER — Encounter (HOSPITAL_COMMUNITY): Payer: Self-pay

## 2020-03-21 ENCOUNTER — Emergency Department (HOSPITAL_COMMUNITY): Payer: BC Managed Care – PPO

## 2020-03-21 DIAGNOSIS — R52 Pain, unspecified: Secondary | ICD-10-CM

## 2020-03-21 DIAGNOSIS — Z7982 Long term (current) use of aspirin: Secondary | ICD-10-CM | POA: Diagnosis not present

## 2020-03-21 DIAGNOSIS — Z79899 Other long term (current) drug therapy: Secondary | ICD-10-CM | POA: Diagnosis not present

## 2020-03-21 DIAGNOSIS — Z87891 Personal history of nicotine dependence: Secondary | ICD-10-CM | POA: Insufficient documentation

## 2020-03-21 DIAGNOSIS — N183 Chronic kidney disease, stage 3 unspecified: Secondary | ICD-10-CM | POA: Diagnosis not present

## 2020-03-21 DIAGNOSIS — E875 Hyperkalemia: Secondary | ICD-10-CM | POA: Diagnosis not present

## 2020-03-21 DIAGNOSIS — M79661 Pain in right lower leg: Secondary | ICD-10-CM | POA: Diagnosis not present

## 2020-03-21 DIAGNOSIS — I130 Hypertensive heart and chronic kidney disease with heart failure and stage 1 through stage 4 chronic kidney disease, or unspecified chronic kidney disease: Secondary | ICD-10-CM | POA: Diagnosis not present

## 2020-03-21 DIAGNOSIS — M79604 Pain in right leg: Secondary | ICD-10-CM | POA: Diagnosis not present

## 2020-03-21 DIAGNOSIS — I5032 Chronic diastolic (congestive) heart failure: Secondary | ICD-10-CM | POA: Insufficient documentation

## 2020-03-21 LAB — BASIC METABOLIC PANEL
Anion gap: 7 (ref 5–15)
BUN: 15 mg/dL (ref 8–23)
CO2: 22 mmol/L (ref 22–32)
Calcium: 9.2 mg/dL (ref 8.9–10.3)
Chloride: 108 mmol/L (ref 98–111)
Creatinine, Ser: 1.36 mg/dL — ABNORMAL HIGH (ref 0.44–1.00)
GFR, Estimated: 43 mL/min — ABNORMAL LOW (ref >60)
Glucose, Bld: 88 mg/dL (ref 70–99)
Potassium: 5.6 mmol/L — ABNORMAL HIGH (ref 3.5–5.1)
Sodium: 137 mmol/L (ref 135–145)

## 2020-03-21 LAB — CBC WITH DIFFERENTIAL/PLATELET
Abs Immature Granulocytes: 0.01 10*3/uL (ref 0.00–0.07)
Basophils Absolute: 0.1 10*3/uL (ref 0.0–0.1)
Basophils Relative: 1 %
Eosinophils Absolute: 0.3 10*3/uL (ref 0.0–0.5)
Eosinophils Relative: 4 %
HCT: 29.7 % — ABNORMAL LOW (ref 36.0–46.0)
Hemoglobin: 9.5 g/dL — ABNORMAL LOW (ref 12.0–15.0)
Immature Granulocytes: 0 %
Lymphocytes Relative: 45 %
Lymphs Abs: 3.5 10*3/uL (ref 0.7–4.0)
MCH: 28.4 pg (ref 26.0–34.0)
MCHC: 32 g/dL (ref 30.0–36.0)
MCV: 88.9 fL (ref 80.0–100.0)
Monocytes Absolute: 0.5 10*3/uL (ref 0.1–1.0)
Monocytes Relative: 7 %
Neutro Abs: 3.3 10*3/uL (ref 1.7–7.7)
Neutrophils Relative %: 43 %
Platelets: 305 10*3/uL (ref 150–400)
RBC: 3.34 MIL/uL — ABNORMAL LOW (ref 3.87–5.11)
RDW: 15.7 % — ABNORMAL HIGH (ref 11.5–15.5)
WBC: 7.6 10*3/uL (ref 4.0–10.5)
nRBC: 0 % (ref 0.0–0.2)

## 2020-03-21 MED ORDER — ONDANSETRON HCL 4 MG/2ML IJ SOLN
4.0000 mg | Freq: Once | INTRAMUSCULAR | Status: AC
  Administered 2020-03-21: 4 mg via INTRAVENOUS
  Filled 2020-03-21: qty 2

## 2020-03-21 MED ORDER — SODIUM ZIRCONIUM CYCLOSILICATE 5 G PO PACK
5.0000 g | PACK | Freq: Once | ORAL | Status: AC
  Administered 2020-03-21: 5 g via ORAL
  Filled 2020-03-21: qty 1

## 2020-03-21 MED ORDER — MORPHINE SULFATE (PF) 4 MG/ML IV SOLN
INTRAVENOUS | Status: AC
  Filled 2020-03-21: qty 1

## 2020-03-21 MED ORDER — HYDROCODONE-ACETAMINOPHEN 5-325 MG PO TABS: 1.0000 | ORAL_TABLET | Freq: Four times a day (QID) | ORAL | 0 refills | Status: DC | PRN

## 2020-03-21 MED ORDER — METHOCARBAMOL 500 MG PO TABS: 500.0000 mg | ORAL_TABLET | Freq: Two times a day (BID) | ORAL | 0 refills | Status: DC

## 2020-03-21 MED ORDER — MORPHINE SULFATE (PF) 4 MG/ML IV SOLN
4.0000 mg | Freq: Once | INTRAVENOUS | Status: AC
  Administered 2020-03-21: 4 mg via INTRAVENOUS

## 2020-03-21 NOTE — Progress Notes (Incomplete)
VASCULAR LAB    Right lower extremity venous duplex has been performed.  See CV proc for preliminary results.  Messaged Providence Lanius, PA-C via secure chat  Sharion Dove, RVT 03/21/2020, 2:04 PM

## 2020-03-21 NOTE — ED Provider Notes (Signed)
[facility] EMERGENCY DEPARTMENT Provider Note   CSN: [id number] Arrival date & time: 03/21/20  1232     History Chief Complaint  Patient presents with  . Leg Pain    Yolanda Espinoza is a 65 y.o. female asthma history of GERD, CHF, hyperlipidemia, peripheral vascular disease, anemia presents for evaluation of right lower extremity pain.  Patient reports that her pain started 4 days ago.  No preceding trauma, injury.  She states is mostly in her thigh.  She states that she had a bone marrow biopsy in January 2022 to determine her anemia.  She states that it was nonspecific but did not require any further evaluation.  She states that the area around where she had a bone marrow biopsy has been hurting all the way down to her thigh into her knee and sometimes into her calf.  She states it is worse when she tries to move her leg, walk on it. She has been able to ambulate. She has not noticed any overlying warmth, erythema, edema.  She has not had any fevers, numbness/weakness.  She states she has been taking over-the-counter medications with no improvement.  He states it mostly hurts more when she tries to move it and she will feel it in her right groin.  She denies any chest pain, difficulty breathing abdominal pain, nausea/vomiting.  The history is provided by the patient.       Past Medical History:  Diagnosis Date  . Anemia   . Arthritis   . Chronic diastolic heart failure ([place])   . GERD (gastroesophageal reflux disease)   . Heart murmur   . Hyperlipidemia   . Hypertension may, 1973  . Mitral regurgitation    a. Echo 01/2012 mild LVH, EF 55-65%, Gr 2 DD, mod MR, mild LAE, PASP 36;  b. Echo (02/2013):  EF 55-60%, Gr 1 DD, mild to mod MR, mild LAE (no sig change since prior echo) // c. Echo 10/18: EF 55-60, normal wall motion, grade 2 diastolic dysfunction, trivial AI, mild to moderate MR, mild LAE, trivial PI, PASP 38    . OSA on CPAP    7 yrs  . Peripheral vascular  disease ([place])    cerebral aneursym  . Seizures ([place]) 01/2017  . Stroke [facility]) 07   no weakness jan 29th 2019 no defecits  . Subarachnoid hemorrhage due to ruptured aneurysm ([place])    2003 - s/p coiling  . Unspecified vitamin D deficiency     Patient Active Problem List   Diagnosis Date Noted  . History of anemia due to chronic kidney disease 02/18/2020  . CKD stage G3b/A2, GFR 30-44 and albumin creatinine ratio 30-299 mg/g (HCC) 01/27/2020  . Deterioration in renal function 01/27/2020  . Allergic rhinitis 12/10/2019  . Persistent cough 12/10/2019  . Anemia 12/04/2019  . Gastroesophageal reflux disease 08/08/2017  . Seizure ([place])   . Gout 10/23/2014  . Morbid obesity ([place]) - BMI 30+ with OSA 04/01/2014  . Abnormal glucose 11/11/2013  . Medication management 11/11/2013  . Brain aneurysm 04/23/2013  . OSA on CPAP   . Vitamin D deficiency   . Hyperlipidemia   . Mitral regurgitation 06/25/2012  . Chronic diastolic heart failure ([place]) 06/25/2012  . Essential hypertension 02/14/2012    Past Surgical History:  Procedure Laterality Date  . ABDOMINAL HYSTERECTOMY     complete  . ANEURYSM COILING    . COLONOSCOPY WITH PROPOFOL N/A 09/05/2018   Procedure: COLONOSCOPY WITH PROPOFOL;  Surgeon:  Juanita Craver, MD;  Location: [facility];  Service: Endoscopy;  Laterality: N/A;  . ESOPHAGOGASTRODUODENOSCOPY (EGD) WITH PROPOFOL N/A 09/05/2018   Procedure: ESOPHAGOGASTRODUODENOSCOPY (EGD) WITH PROPOFOL;  Surgeon: Juanita Craver, MD;  Location: [facility] ENDOSCOPY;  Service: Endoscopy;  Laterality: N/A;  . GIVENS CAPSULE STUDY N/A 10/17/2018   Procedure: GIVENS CAPSULE STUDY;  Surgeon: Juanita Craver, MD;  Location: [facility] ENDOSCOPY;  Service: Endoscopy;  Laterality: N/A;  . head surgery     aneurysm ruptured- stroke  . IR 3D INDEPENDENT WKST  09/04/2016  . IR ANGIO INTRA EXTRACRAN SEL COM CAROTID INNOMINATE BILAT MOD SED  09/04/2016  . IR ANGIO INTRA EXTRACRAN SEL INTERNAL CAROTID UNI L MOD SED  02/21/2017  .  IR ANGIO VERTEBRAL SEL VERTEBRAL BILAT MOD SED  09/04/2016  . IR ANGIOGRAM EXTREMITY LEFT  09/04/2016  . IR ANGIOGRAM FOLLOW UP STUDY  02/21/2017  . IR NEURO EACH ADD'L AFTER BASIC UNI LEFT (MS)  02/21/2017  . IR RADIOLOGIST EVAL & MGMT  10/11/2016  . IR RADIOLOGIST EVAL & MGMT  03/07/2017  . IR TRANSCATH/EMBOLIZ  02/21/2017  . RADIOLOGY WITH ANESTHESIA N/A 04/23/2013   Procedure: RADIOLOGY WITH ANESTHESIA;  Surgeon: Rob Hickman, MD;  Location: [place];  Service: Radiology;  Laterality: N/A;  . RADIOLOGY WITH ANESTHESIA N/A 10/30/2016   Procedure: EMBOLIZATION;  Surgeon: Luanne Bras, MD;  Location: [place];  Service: Radiology;  Laterality: N/A;  . RADIOLOGY WITH ANESTHESIA N/A 02/21/2017   Procedure: EMBOLIZATION;  Surgeon: Luanne Bras, MD;  Location: [place];  Service: Radiology;  Laterality: N/A;  . TONSILLECTOMY AND ADENOIDECTOMY    . TUBAL LIGATION       OB History   No obstetric history on file.     Family History  Problem Relation Age of Onset  . Hypertension Father   . Kidney disease Father   . Hypertension Sister   . Cancer Sister 68       Colon cancer  . Hypertension Sister   . Hypertension Sister     Social History   Tobacco Use  . Smoking status: Former Smoker    Packs/day: 0.25    Years: 3.00    Pack years: 0.75    Types: Cigarettes    Quit date: 01/24/1999    Years since quitting: 21.1  . Smokeless tobacco: Never Used  Vaping Use  . Vaping Use: Never used  Substance Use Topics  . Alcohol use: No  . Drug use: No    Home Medications Prior to Admission medications   Medication Sig Start Date End Date Taking? Authorizing Provider  HYDROcodone-acetaminophen (NORCO/VICODIN) 5-325 MG tablet Take 1-2 tablets by mouth every 6 (six) hours as needed. 03/21/20  Yes Volanda Napoleon, PA-C  methocarbamol (ROBAXIN) 500 MG tablet Take 1 tablet (500 mg total) by mouth 2 (two) times daily for 10 days. 03/21/20 03/31/20 Yes Volanda Napoleon, PA-C  allopurinol  (ZYLOPRIM) 100 MG tablet Take     1 tablet    Daily     to Prevent Gout 12/26/19   Liane Comber, NP  amLODipine (NORVASC) 10 MG tablet TAKE 1 TABLET BY MOUTH DAILY FOR BLOOD PRESSURE 01/28/20   Liane Comber, NP  aspirin EC 81 MG tablet Take 1 tablet (81 mg total) by mouth daily. 03/11/15   Nahser, Wonda Cheng, MD  Calcium Carb-Cholecalciferol (CALCIUM 600 + D PO) Take 1 tablet by mouth daily.    [provider]  cholecalciferol (VITAMIN D3) 25 MCG (1000 UT) tablet Take 1,000  Units by mouth daily.    [provider]  cyclobenzaprine (FLEXERIL) 10 MG tablet TAKE 1 TABLET BY MOUTH EVERY NIGHT AS NEEDED AT BEDTIME FOR MUSCLE SPASMS 01/28/20   Liane Comber, NP  diclofenac (VOLTAREN) 75 MG EC tablet Take 1 tablet    2 x /day    with Food for Pain & Inflammation 01/26/20   Liane Comber, NP  diclofenac sodium (VOLTAREN) 1 % GEL Apply 2 g topically 4 (four) times daily. Right wrist and Right elbow 10/16/17   Charlett Blake, MD  estrogens, conjugated, (PREMARIN) 0.625 MG tablet Take 1 tablet Daily for Hormones 01/26/20   Liane Comber, NP  famotidine (PEPCID) 20 MG tablet TAKE 1 TABLET BY MOUTH EVERY MORNING 30-60 MINUTES PRIOR TO BREAKFAST FOR REFLUX 01/28/20   Liane Comber, NP  ferrous sulfate 325 (65 FE) MG tablet Take 325 mg by mouth daily with breakfast.    [provider]  fexofenadine (ALLEGRA ALLERGY) 180 MG tablet Take 1 tablet (180 mg total) by mouth daily. 12/26/19   Liane Comber, NP  furosemide (LASIX) 40 MG tablet Take 1 tablet Daily for BP, Fluid Retention & Ankle Swelling 11/14/18   Unk Pinto, MD  levETIRAcetam (KEPPRA) 1000 MG tablet TAKE ONE (1) TABLET BY MOUTH TWICE DAILY TO PREVENT SEIZURES 01/28/20   Liane Comber, NP  Multiple Vitamins-Minerals (MULTIVITAMIN WITH MINERALS) tablet Take 1 tablet by mouth daily.    [provider]  nystatin (NYSTATIN) powder APPLY TOPICALLY WITH BRUSH TO AREA AFTER A SHOWER TO PREVENT INFECTION 07/18/19    Vicie Mutters R, PA-C  nystatin cream (MYCOSTATIN) Apply 1 application topically 2 (two) times daily. Apply    2 x /day    to rash 02/10/20   Unk Pinto, MD  olmesartan [facility]) 40 MG tablet Take 1 tablet Daily for BP 12/26/19   Liane Comber, NP  Omega-3 Fatty Acids (FISH OIL) 1000 MG CAPS Take 1,000 mg by mouth daily.    [provider]  pantoprazole (PROTONIX) 40 MG tablet TAKE 1 TABLET BY MOUTH DAILY IN THE EVENING TO PREVENT INDIGESTION AND HEARTBURN 01/28/20   Liane Comber, NP  ticagrelor (BRILINTA) 90 MG TABS tablet Take 1 tablet 2 x /day to Prevent Blood Clots 12/26/19   Liane Comber, NP  traMADol (ULTRAM) 50 MG tablet 1 tablet as needed up to 3 x a daily for severe pain 11/11/18   Vladimir Crofts, PA-C  vitamin B-12 (CYANOCOBALAMIN) 500 MCG tablet Take 1,000 mcg by mouth daily.    [provider]    Allergies    Ace inhibitors and Aspirin  Review of Systems   Review of Systems  Constitutional: Negative for fever.  Respiratory: Negative for cough and shortness of breath.   Cardiovascular: Negative for chest pain.  Gastrointestinal: Negative for abdominal pain, nausea and vomiting.  Genitourinary: Negative for dysuria and hematuria.  Musculoskeletal:       Leg pain  Neurological: Negative for headaches.  All other systems reviewed and are negative.   Physical Exam Updated Vital Signs BP (!) 153/94   Pulse 68   Temp 98.5 F (36.9 C) (Oral)   Resp 14   Ht _0  (1.778 m)   Wt 96.9 kg   SpO2 100%   BMI 30.65 kg/m   Physical Exam Vitals and nursing note reviewed.  Constitutional:      Appearance: Normal appearance. She is well-developed and well-nourished.  HENT:     Head: Normocephalic and atraumatic.  Mouth/Throat:     Mouth: Oropharynx is clear and moist and mucous membranes are normal.  Eyes:     General: Lids are normal.     Extraocular Movements: EOM normal.     Conjunctiva/sclera: Conjunctivae normal.     Pupils: Pupils  are equal, round, and reactive to light.  Cardiovascular:     Rate and Rhythm: Normal rate and regular rhythm.     Pulses: Normal pulses.          Dorsalis pedis pulses are 2+ on the right side and 2+ on the left side.     Heart sounds: Normal heart sounds. No murmur heard. No friction rub. No gallop.   Pulmonary:     Effort: Pulmonary effort is normal.     Breath sounds: Normal breath sounds.  Abdominal:     Palpations: Abdomen is soft. Abdomen is not rigid.     Tenderness: There is no abdominal tenderness. There is no guarding.  Musculoskeletal:        General: Normal range of motion.     Cervical back: Full passive range of motion without pain.     Comments: Tenderness palpation into the right thigh.  No overlying soft tissue swelling, ecchymosis, edema, erythema.  No deformity or crepitus noted.  Pain with raising off the table but can hold it against gravity.  She also reports pain in her right groin with internal and external rotation.  No bony tenderness noted to tib-fib, ankle, foot.  No tenderness palpation in left lower extremity.  Skin:    General: Skin is warm and dry.     Capillary Refill: Capillary refill takes less than 2 seconds.     Comments: Good distal cap refill. RLE is not dusky in appearance or cool to touch.  Neurological:     Mental Status: She is alert and oriented to person, place, and time.     Comments: 5/5 strength BLE Sensation intact along major nerve distributions of BLE  Psychiatric:        Mood and Affect: Mood and affect normal.        Speech: Speech normal.     ED Results / Procedures / Treatments   Labs (all labs ordered are listed, but only abnormal results are displayed) Labs Reviewed  BASIC METABOLIC PANEL - Abnormal; Notable for the following components:      Result Value   Potassium 5.6 (*)    Creatinine, Ser 1.36 (*)    GFR, Estimated 43 (*)    All other components within normal limits  CBC WITH DIFFERENTIAL/PLATELET - Abnormal;  Notable for the following components:   RBC 3.34 (*)    Hemoglobin 9.5 (*)    HCT 29.7 (*)    RDW 15.7 (*)    All other components within normal limits    EKG None  Radiology DG Pelvis 1-2 Views  Result Date: 03/21/2020 CLINICAL DATA:  Right groin pain which radiates to the right medial knee. EXAM: PELVIS - 1-2 VIEW COMPARISON:  None. FINDINGS: There is no evidence of pelvic fracture or diastasis. No pelvic bone lesions are seen. IMPRESSION: Negative. Electronically Signed   By: Dahlia Bailiff MD   On: 03/21/2020 13:54   DG Femur Min 2 Views Right  Result Date: 03/21/2020 CLINICAL DATA:  Right groin pain that radiates to the knee. EXAM: RIGHT FEMUR 2 VIEWS COMPARISON:  None. FINDINGS: There is no evidence of fracture or other focal bone lesions. Soft tissues are unremarkable. IMPRESSION: Negative. Electronically  Signed   By: Dahlia Bailiff MD   On: 03/21/2020 13:54   VAS [place] LOWER EXTREMITY VENOUS (DVT) (ONLY [facility] & [facility] 7a-7p)  Result Date: 03/21/2020  Lower Venous DVT Study Indications: Pain.  Comparison Study: No prior study on file Performing Technologist: Sharion Dove RVS  Examination Guidelines: A complete evaluation includes B-mode imaging, spectral Doppler, color Doppler, and power Doppler as needed of all accessible portions of each vessel. Bilateral testing is considered an integral part of a complete examination. Limited examinations for reoccurring indications may be performed as noted. The reflux portion of the exam is performed with the patient in reverse Trendelenburg.  +---------+---------------+---------+-----------+----------+--------------+ RIGHT    CompressibilityPhasicitySpontaneityPropertiesThrombus Aging +---------+---------------+---------+-----------+----------+--------------+ CFV      Full           Yes      Yes                                 +---------+---------------+---------+-----------+----------+--------------+ SFJ      Full                                                         +---------+---------------+---------+-----------+----------+--------------+ FV Prox  Full                                                        +---------+---------------+---------+-----------+----------+--------------+ FV Mid   Full                                                        +---------+---------------+---------+-----------+----------+--------------+ FV DistalFull                                                        +---------+---------------+---------+-----------+----------+--------------+ PFV      Full                                                        +---------+---------------+---------+-----------+----------+--------------+ POP      Full           Yes      Yes                                 +---------+---------------+---------+-----------+----------+--------------+ PTV      Full                                                        +---------+---------------+---------+-----------+----------+--------------+  PERO     Full                                                        +---------+---------------+---------+-----------+----------+--------------+   +----+---------------+---------+-----------+----------+--------------+ LEFTCompressibilityPhasicitySpontaneityPropertiesThrombus Aging +----+---------------+---------+-----------+----------+--------------+ CFV Full           Yes      Yes                                 +----+---------------+---------+-----------+----------+--------------+     Summary: RIGHT: - There is no evidence of deep vein thrombosis in the lower extremity.  LEFT: - No evidence of common femoral vein obstruction.  *See table(s) above for measurements and observations.    Preliminary     Procedures Procedures   Medications Ordered in ED Medications  ondansetron (ZOFRAN) injection 4 mg (4 mg Intravenous Given 03/21/20 1319)  morphine 4 MG/ML injection 4 mg (4 mg  Intravenous Given 03/21/20 1320)  sodium zirconium cyclosilicate (LOKELMA) packet 5 g (5 g Oral Given 03/21/20 1514)    ED Course  I have reviewed the triage vital signs and the nursing notes.  Pertinent labs & imaging results that were available during my care of the patient were reviewed by me and considered in my medical decision making (see chart for details).    MDM Rules/Calculators/A&P                          65 year old female who presents for evaluation of l right lower extremity pain that has been ongoing for the last 4 days.  No preceding trauma, injury.  She reports it hurts more when she walks, moves.  She states initially, is in the posterior aspect of her thigh and has radiated down.  No fevers, numbness/weakness.  On initial arrival, she is afebrile nontoxic-appearing.  Vital signs are stable.  On exam, she has tenderness noted to the right thigh into the gluteal area and down into the distal thigh.  No overlying warmth, erythema.  No deformity or crepitus noted.  She has good pulses, cap refill.  When she lifts the leg off the table, she states it hurts and points to her hamstring muscles.  She is able to hold it against gravity for a few seconds and then states it is too painful.  When I passively range of motion her leg, she reports pain to the posterior aspect as well into the groin.  Seems to be related to movement, certain motions.  Low suspicion for DVT but she does feel like it radiates down she is on estrogen therapy.  History/physical exam not concerning for ischemic limb, septic arthritis.  Additionally, I evaluated the area where she had the bone marrow biopsy.  There is no overlying warmth, erythema to suggest infectious process.  History/physical exam not concerning for cellulitis.  We will plan to check DVT study as well as x-rays for any bony abnormality.  Additionally, will obtain lab work.  BMP shows potassium 5.6.  BUN and creatinine stable.  CBC shows no  leukocytosis.  Hemoglobin is 9.5.  This is at her baseline.  Her DVT study is negative.  X-ray negative for any acute abnormalities.  Reevaluation.  Patient still with good cap refill, pulses.  Her  right lower extremity is warm to touch.  History/physical exam not concerning for ischemic limb.  I suspect this is MSK in etiology as it correlates to use very specific movements.  Also question of there is some sciatica but she does not really have any back tenderness.  At this time, patient is stable.  We discussed treatment with muscle relaxers/pain medication instructed patient follow-up with her PCP in about a week.  Patient is agreeable to plan.  Given her slightly elevated potassium.  Patient given a dose of Lokelma here in the ED. At this time, patient exhibits no emergent life-threatening condition that require further evaluation in ED. Strict return precautions discussed. Patient expresses understanding and agreement to plan.   Portions of this note were generated with [employer]. Dictation errors may occur despite best attempts at proofreading.   Final Clinical Impression(s) / ED Diagnoses Final diagnoses:  Pain of right lower extremity  Hyperkalemia    Rx / DC Orders ED Discharge Orders         Ordered    methocarbamol (ROBAXIN) 500 MG tablet  2 times daily        03/21/20 1524    HYDROcodone-acetaminophen (NORCO/VICODIN) 5-325 MG tablet  Every 6 hours PRN        03/21/20 1524           Desma Mcgregor 03/21/20 1525    Gareth Morgan, MD 03/21/20 1616

## 2020-03-21 NOTE — Discharge Instructions (Signed)
As we discussed, your work-up was reassuring today.  As we discussed, this could be musculoskeletal related.  We will plan to treat.  Take Robaxin as prescribed. This medication will make you drowsy so do not drive or drink alcohol when taking it.  Take pain medications as directed for break through pain. Do not drive or operate machinery while taking this medication.   Do not take the pain medication at the same time as the Robaxin.  As we also discussed, your potassium was slightly high here in the emergency department.  Please follow-up with your primary care doctor by the end of the week to 1) have your potassium levels rechecked and 2) for reevaluation of your leg pain.  Return the emergency department sooner if your leg pain worsens, starts getting red, swollen, discolored, numbness/weakness or any other worsening concerning symptoms.

## 2020-03-21 NOTE — ED Triage Notes (Addendum)
Pt c.o right leg pain from her groin down to her knee since Thursday. Pt had a bone biopsy in her right hip a little over a month ago. Pt ambulatory.

## 2020-03-21 NOTE — ED Notes (Addendum)
Pt transported to xray 

## 2020-03-29 ENCOUNTER — Other Ambulatory Visit: Payer: Self-pay | Admitting: Internal Medicine

## 2020-03-29 DIAGNOSIS — B372 Candidiasis of skin and nail: Secondary | ICD-10-CM

## 2020-03-29 NOTE — Progress Notes (Unsigned)
Hospital follow up  Assessment and Plan: Hospital visit follow up for right hip pain  Diagnoses and all orders for this visit:  Hyperkalemia -     COMPLETE METABOLIC PANEL WITH GFR  Essential hypertension Continue current medications:norvas 28m, furosemide 480m Olmesartan 406monitor blood pressure at home; call if consistently over 130/80 Continue DASH diet.   Reminder to go to the ER if any CP, SOB, nausea, dizziness, severe HA, changes vision/speech, left arm numbness and tingling and jaw pain. -     CBC with Differential/Platelet Taking Brillient for valve replacement  Right hip pain Adductor tendonitis? -     predniSONE (STERAPRED UNI-PAK 21 TAB) 10 MG (21) TBPK tablet; Take by mouth daily. Follow directions on Taper pack Will place refferal for Ortho today Contact office with any new or worsening symptoms Consider gabapentin?    All medications were reviewed with patient and family and fully reconciled. All questions answered fully, and patient and family members were encouraged to call the office with any further questions or concerns. Discussed goal to avoid readmission related to this diagnosis.  Medications Discontinued During This Encounter  Medication Reason  . traMADol (ULTRAM) 50 MG tablet Completed Course  . methocarbamol (ROBAXIN) 500 MG tablet Completed Course  . HYDROcodone-acetaminophen (NORCO/VICODIN) 5-325 MG tablet Completed Course    CAN NOT DO FOR BCBS REGULAR OR MEDICARE Over 40 minutes of exam, counseling, chart review, and complex, high/moderate level critical decision making was performed this visit.   Future Appointments  Date Time Provider DepFlagler/28/2022  9:00 AM Dohmeier, CarAsencion PartridgeD [facility] None  05/05/2020 11:00 AM CorLiane ComberP [facility] None  08/04/2020 11:30 AM CorLiane ComberP [facility] None  01/26/2021 10:00 AM CorLiane ComberP [facility] None     HPI [age]o.female presents for follow up for transition  from recent hospitalization or SNIF stay. Admit date to the hospital was 03/21/20, patient was discharged from the hospital on 03/21/20 and our clinical staff contacted the office the day after discharge to set up a follow up appointment. The discharge summary, medications, and diagnostic test results were reviewed before meeting with the patient. The patient was admitted for: Right lower extremity pain  Reevaluation.  Patient still with good cap refill, pulses.  Her right lower extremity is warm to touch.  History/physical exam not concerning for ischemic limb.  I suspect this is MSK in etiology as it correlates to use very specific movements.  Also question of there is some sciatica but she does not really have any back tenderness.  At this time, patient is stable.  We discussed treatment with muscle relaxers/pain medication instructed patient follow-up with her PCP in about a week.  Patient is agreeable to plan.  Given her slightly elevated potassium.  Patient given a dose of Lokelma here in the ED. At this time, patient exhibits no emergent life-threatening condition that require further evaluation in ED. Strict return precautions discussed. Patient expresses understanding and agreement to plan.   Home health is not involved.   Images while in the hospital: DG Pelvis 1-2 Views  Result Date: 03/21/2020 CLINICAL DATA:  Right groin pain which radiates to the right medial knee. EXAM: PELVIS - 1-2 VIEW COMPARISON:  None. FINDINGS: There is no evidence of pelvic fracture or diastasis. No pelvic bone lesions are seen. IMPRESSION: Negative. Electronically Signed   By: JefDahlia Bailiff   On: 03/21/2020 13:54   DG Femur Min 2 Views Right  Result Date: 03/21/2020 CLINICAL  DATA:  Right groin pain that radiates to the knee. EXAM: RIGHT FEMUR 2 VIEWS COMPARISON:  None. FINDINGS: There is no evidence of fracture or other focal bone lesions. Soft tissues are unremarkable. IMPRESSION: Negative. Electronically Signed    By: Dahlia Bailiff MD   On: 03/21/2020 13:54   VAS [place] LOWER EXTREMITY VENOUS (DVT) (ONLY [facility] & [facility] 7a-7p)  Result Date: 03/22/2020  Lower Venous DVT Study Indications: Pain.  Comparison Study: No prior study on file Performing Technologist: Sharion Dove RVS  Examination Guidelines: A complete evaluation includes B-mode imaging, spectral Doppler, color Doppler, and power Doppler as needed of all accessible portions of each vessel. Bilateral testing is considered an integral part of a complete examination. Limited examinations for reoccurring indications may be performed as noted. The reflux portion of the exam is performed with the patient in reverse Trendelenburg.  +---------+---------------+---------+-----------+----------+--------------+ RIGHT    CompressibilityPhasicitySpontaneityPropertiesThrombus Aging +---------+---------------+---------+-----------+----------+--------------+ CFV      Full           Yes      Yes                                 +---------+---------------+---------+-----------+----------+--------------+ SFJ      Full                                                        +---------+---------------+---------+-----------+----------+--------------+ FV Prox  Full                                                        +---------+---------------+---------+-----------+----------+--------------+ FV Mid   Full                                                        +---------+---------------+---------+-----------+----------+--------------+ FV DistalFull                                                        +---------+---------------+---------+-----------+----------+--------------+ PFV      Full                                                        +---------+---------------+---------+-----------+----------+--------------+ POP      Full           Yes      Yes                                  +---------+---------------+---------+-----------+----------+--------------+ PTV      Full                                                        +---------+---------------+---------+-----------+----------+--------------+  PERO     Full                                                        +---------+---------------+---------+-----------+----------+--------------+   +----+---------------+---------+-----------+----------+--------------+ LEFTCompressibilityPhasicitySpontaneityPropertiesThrombus Aging +----+---------------+---------+-----------+----------+--------------+ CFV Full           Yes      Yes                                 +----+---------------+---------+-----------+----------+--------------+     Summary: RIGHT: - There is no evidence of deep vein thrombosis in the lower extremity.  LEFT: - No evidence of common femoral vein obstruction.  *See table(s) above for measurements and observations. Electronically signed by Servando Snare MD on 03/22/2020 at 5:59:23 PM.    Final     Today she reports that the pain started in the hip joint radiates to the groin down to the leg to knee. Achy 5/10 pain. At times it is severe 9/10.  It is disruptive to her sleep, but she has not missed any work related to this.  She reports increase in symptoms after being up on her feet all day. Sitting still makes it worse, movement makes it better.  It started four days ago.  After two days she went to the ER for evaluation as it was so sever.  She is here today for follow up from this.   She has tried the robaxin, flexeril, tramadol and norco. During interview she is unable to distinguish between the medication or what they are for.  She has also tried Voltaren gel that has not helped with her symptoms.  She has antalgic gait, right.  Limited flexion and adduction and internal rotation.  There is also 4/5 strength. Left is asymptomatic.  She had a bone marrow biopsy to the right hip in  Jan.    Current Outpatient Medications (Endocrine & Metabolic):  .  estrogens, conjugated, (PREMARIN) 0.625 MG tablet, Take 1 tablet Daily for Hormones .  predniSONE (STERAPRED UNI-PAK 21 TAB) 10 MG (21) TBPK tablet, Take by mouth daily. Follow directions on Taper pack  Current Outpatient Medications (Cardiovascular):  .  amLODipine (NORVASC) 10 MG tablet, TAKE 1 TABLET BY MOUTH DAILY FOR BLOOD PRESSURE .  furosemide (LASIX) 40 MG tablet, Take 1 tablet Daily for BP, Fluid Retention & Ankle Swelling .  olmesartan (BENICAR) 40 MG tablet, Take 1 tablet Daily for BP  Current Outpatient Medications (Respiratory):  .  fexofenadine (ALLEGRA ALLERGY) 180 MG tablet, Take 1 tablet (180 mg total) by mouth daily.  Current Outpatient Medications (Analgesics):  .  allopurinol (ZYLOPRIM) 100 MG tablet, Take     1 tablet    Daily     to Prevent Gout .  aspirin EC 81 MG tablet, Take 1 tablet (81 mg total) by mouth daily. .  diclofenac (VOLTAREN) 75 MG EC tablet, Take 1 tablet    2 x /day    with Food for Pain & Inflammation  Current Outpatient Medications (Hematological):  .  ferrous sulfate 325 (65 FE) MG tablet, Take 325 mg by mouth daily with breakfast. .  ticagrelor (BRILINTA) 90 MG TABS tablet, Take 1 tablet 2 x /day to Prevent Blood Clots .  vitamin B-12 (CYANOCOBALAMIN) 500 MCG tablet, Take 1,000  mcg by mouth daily.  Current Outpatient Medications (Other):  Marland Kitchen  Calcium Carb-Cholecalciferol (CALCIUM 600 + D PO), Take 1 tablet by mouth daily. .  cholecalciferol (VITAMIN D3) 25 MCG (1000 UT) tablet, Take 1,000 Units by mouth daily. .  cyclobenzaprine (FLEXERIL) 10 MG tablet, TAKE 1 TABLET BY MOUTH EVERY NIGHT AS NEEDED AT BEDTIME FOR MUSCLE SPASMS .  diclofenac sodium (VOLTAREN) 1 % GEL, Apply 2 g topically 4 (four) times daily. Right wrist and Right elbow .  famotidine (PEPCID) 20 MG tablet, TAKE 1 TABLET BY MOUTH EVERY MORNING 30-60 MINUTES PRIOR TO BREAKFAST FOR REFLUX .  levETIRAcetam (KEPPRA)  1000 MG tablet, TAKE ONE (1) TABLET BY MOUTH TWICE DAILY TO PREVENT SEIZURES .  Multiple Vitamins-Minerals (MULTIVITAMIN WITH MINERALS) tablet, Take 1 tablet by mouth daily. Marland Kitchen  nystatin (NYSTATIN) powder, APPLY TOPICALLY WITH BRUSH TO AREA AFTER A SHOWER TO PREVENT INFECTION .  nystatin cream (MYCOSTATIN), APPLY 1 APPLICATION TOPICALLY TO RASH TWICE DAILY .  Omega-3 Fatty Acids (FISH OIL) 1000 MG CAPS, Take 1,000 mg by mouth daily. .  pantoprazole (PROTONIX) 40 MG tablet, TAKE 1 TABLET BY MOUTH DAILY IN THE EVENING TO PREVENT INDIGESTION AND HEARTBURN  Past Medical History:  Diagnosis Date  . Anemia   . Arthritis   . Chronic diastolic heart failure ([place])   . GERD (gastroesophageal reflux disease)   . Heart murmur   . Hyperlipidemia   . Hypertension may, 1973  . Mitral regurgitation    a. Echo 01/2012 mild LVH, EF 55-65%, Gr 2 DD, mod MR, mild LAE, PASP 36;  b. Echo (02/2013):  EF 55-60%, Gr 1 DD, mild to mod MR, mild LAE (no sig change since prior echo) // c. Echo 10/18: EF 55-60, normal wall motion, grade 2 diastolic dysfunction, trivial AI, mild to moderate MR, mild LAE, trivial PI, PASP 38    . OSA on CPAP    7 yrs  . Peripheral vascular disease ([place])    cerebral aneursym  . Seizures ([place]) 01/2017  . Stroke [facility]) 07   no weakness jan 29th 2019 no defecits  . Subarachnoid hemorrhage due to ruptured aneurysm ([place])    2003 - s/p coiling  . Unspecified vitamin D deficiency      Allergies  Allergen Reactions  . Ace Inhibitors Nausea And Vomiting  . Aspirin Nausea And Vomiting    Can take coated Aspirin    ROS: all negative except above.   Physical Exam: Filed Weights   03/30/20 1509  Weight: 217 lb (98.4 kg)   BP 120/82   Pulse 65   Temp (!) 97.3 F (36.3 C)   Ht _0  (1.778 m)   Wt 217 lb (98.4 kg)   SpO2 99%   BMI 31.14 kg/m  General Appearance: Well nourished, in no apparent distress. Eyes: PERRLA, EOMs, conjunctiva no swelling or erythema Sinuses: No  Frontal/maxillary tenderness ENT/Mouth: Ext aud canals clear, TMs without erythema, bulging. No erythema, swelling, or exudate on post pharynx.  Tonsils not swollen or erythematous. Hearing normal.  Neck: Supple, thyroid normal.  Respiratory: Respiratory effort normal, BS equal bilaterally without rales, rhonchi, wheezing or stridor.  Cardio: RRR with no MRGs. Brisk peripheral pulses without edema.  Abdomen: Soft, + BS.  Non tender, no guarding, rebound, hernias, masses. Lymphatics: Non tender without lymphadenopathy.  Musculoskeletal: Full ROM, 5/5 strength, antalgic gait, RLE 4/5 strength, Point tendeereness to adductor longus, vastus lateralis. RLE decreased ROM with adduction. No popping clicking or crepitus noted. Skin: Warm,  dry without rashes, lesions, ecchymosis.  Neuro: Cranial nerves intact. Normal muscle tone, no cerebellar symptoms. Sensation intact.  Psych: Awake and oriented X 3, normal affect, Insight and Judgment appropriate.      Garnet Sierras, Laqueta Jean, DNP [facility] 03/30/2020  3:19 PM

## 2020-03-30 ENCOUNTER — Other Ambulatory Visit: Payer: Self-pay

## 2020-03-30 ENCOUNTER — Encounter: Payer: Self-pay | Admitting: Adult Health Nurse Practitioner

## 2020-03-30 ENCOUNTER — Ambulatory Visit (INDEPENDENT_AMBULATORY_CARE_PROVIDER_SITE_OTHER): Payer: BC Managed Care – PPO | Admitting: Adult Health Nurse Practitioner

## 2020-03-30 VITALS — BP 120/82 | HR 65 | Temp 97.3°F | Ht 70.0 in | Wt 217.0 lb

## 2020-03-30 DIAGNOSIS — M25551 Pain in right hip: Secondary | ICD-10-CM

## 2020-03-30 DIAGNOSIS — M76899 Other specified enthesopathies of unspecified lower limb, excluding foot: Secondary | ICD-10-CM

## 2020-03-30 DIAGNOSIS — I10 Essential (primary) hypertension: Secondary | ICD-10-CM | POA: Diagnosis not present

## 2020-03-30 DIAGNOSIS — E875 Hyperkalemia: Secondary | ICD-10-CM | POA: Diagnosis not present

## 2020-03-30 MED ORDER — PREDNISONE 10 MG (21) PO TBPK: ORAL_TABLET | Freq: Every day | ORAL | 0 refills | Status: DC

## 2020-03-30 NOTE — Patient Instructions (Addendum)
We are going to send in Prednisone taper pack for you to take.  Start taking this in the morning with food.  Ice the area for 36mn at a time 2-4 times a day.  We will also place a referral for Orthopedics for evaluation.    Follow these instructions at home: PRICE Therapy 1. Protect the muscle from being injured again. 2. Rest. Do not use the strained muscle if it causes pain. 3. If directed, put ice on the injured area: ? Put ice in a plastic bag. ? Place a towel between your skin and the bag. ? Leave the ice on for 20 minutes, 2-3 times a day. Do this for the first 2 days after the injury. 4. Apply compression by wrapping the injured area with an elastic bandage as told by your health care provider. 5. Raise (elevate) the injured area above the level of your heart while you are sitting or lying down.   General instructions 1. Take over-the-counter and prescription medicines only as told by your health care provider. 2. Walk, stretch, and do exercises as told by your health care provider. Only do these activities if you can do so without any pain. 3. Follow your treatment plan as told by your health care provider. This may include: ? Physical therapy. ? Massage. ? Local electrical stimulation (transcutaneous electrical nerve stimulation, TENS). How is this prevented? 1. Warm up and stretch before being active. 2. Cool down and stretch after being active. 3. Give your body time to rest between periods of activity. 4. Make sure to use equipment that fits you. 5. Be safe and responsible while being active to avoid slips and falls. 6. Maintain physical fitness, including: ? Proper conditioning in the adductor muscles. ? Overall strength, flexibility, and endurance. Contact a health care provider if:  You have increased pain or swelling in the affected area.  Your symptoms are not improving or they are getting worse. Summary  An adductor muscle strain, also called a groin strain  or pull, is an injury to the muscles or tendons on the upper, inner part of the thigh.  A muscle strain occurs when a muscle is overstretched and some muscle fibers are torn.  Depending on the severity of the muscle strain, recovery time may vary from a few weeks to several months. This information is not intended to replace advice given to you by your health care provider. Make sure you discuss any questions you have with your health care provider. Document Revised: 04/30/2018 Document Reviewed: 06/11/2017 Elsevier Patient Education  2021 [employer]

## 2020-03-31 LAB — COMPLETE METABOLIC PANEL WITH GFR
AG Ratio: 1.4 (calc) (ref 1.0–2.5)
ALT: 13 U/L (ref 6–29)
AST: 17 U/L (ref 10–35)
Albumin: 4.5 g/dL (ref 3.6–5.1)
Alkaline phosphatase (APISO): 58 U/L (ref 37–153)
BUN/Creatinine Ratio: 16 (calc) (ref 6–22)
BUN: 23 mg/dL (ref 7–25)
CO2: 24 mmol/L (ref 20–32)
Calcium: 9.9 mg/dL (ref 8.6–10.4)
Chloride: 108 mmol/L (ref 98–110)
Creat: 1.4 mg/dL — ABNORMAL HIGH (ref 0.50–0.99)
GFR, Est African American: 46 mL/min/{1.73_m2} — ABNORMAL LOW (ref >=60)
GFR, Est Non African American: 40 mL/min/{1.73_m2} — ABNORMAL LOW (ref >=60)
Globulin: 3.2 g/dL (calc) (ref 1.9–3.7)
Glucose, Bld: 73 mg/dL (ref 65–99)
Potassium: 5.1 mmol/L (ref 3.5–5.3)
Sodium: 140 mmol/L (ref 135–146)
Total Bilirubin: 0.3 mg/dL (ref 0.2–1.2)
Total Protein: 7.7 g/dL (ref 6.1–8.1)

## 2020-03-31 LAB — CBC WITH DIFFERENTIAL/PLATELET
Absolute Monocytes: 465 cells/uL (ref 200–950)
Basophils Absolute: 50 cells/uL (ref 0–200)
Basophils Relative: 0.6 %
Eosinophils Absolute: 282 cells/uL (ref 15–500)
Eosinophils Relative: 3.4 %
HCT: 30.1 % — ABNORMAL LOW (ref 35.0–45.0)
Hemoglobin: 9.5 g/dL — ABNORMAL LOW (ref 11.7–15.5)
Lymphs Abs: 3984 cells/uL — ABNORMAL HIGH (ref 850–3900)
MCH: 27.5 pg (ref 27.0–33.0)
MCHC: 31.6 g/dL — ABNORMAL LOW (ref 32.0–36.0)
MCV: 87 fL (ref 80.0–100.0)
MPV: 9.9 fL (ref 7.5–12.5)
Monocytes Relative: 5.6 %
Neutro Abs: 3519 cells/uL (ref 1500–7800)
Neutrophils Relative %: 42.4 %
Platelets: 303 10*3/uL (ref 140–400)
RBC: 3.46 10*6/uL — ABNORMAL LOW (ref 3.80–5.10)
RDW: 15.8 % — ABNORMAL HIGH (ref 11.0–15.0)
Total Lymphocyte: 48 %
WBC: 8.3 10*3/uL (ref 3.8–10.8)

## 2020-04-01 ENCOUNTER — Telehealth (HOSPITAL_COMMUNITY): Payer: Self-pay

## 2020-04-01 NOTE — Telephone Encounter (Signed)
Spoke to pt about scheduling her f/u mri. Yolanda Espinoza is having trouble getting in contact with the insurance company to get auth. No one is answering. Informed Yolanda Espinoza of the hold up. She was able to get a different contact number for me to give to [place]. Will update and get back in touch with the patient. AW

## 2020-04-05 ENCOUNTER — Other Ambulatory Visit: Payer: Self-pay

## 2020-04-05 ENCOUNTER — Encounter: Payer: Self-pay | Admitting: Physician Assistant

## 2020-04-05 ENCOUNTER — Ambulatory Visit: Payer: BC Managed Care – PPO | Admitting: Orthopaedic Surgery

## 2020-04-05 DIAGNOSIS — M25551 Pain in right hip: Secondary | ICD-10-CM

## 2020-04-05 DIAGNOSIS — Z20828 Contact with and (suspected) exposure to other viral communicable diseases: Secondary | ICD-10-CM | POA: Diagnosis not present

## 2020-04-05 DIAGNOSIS — Z1159 Encounter for screening for other viral diseases: Secondary | ICD-10-CM | POA: Diagnosis not present

## 2020-04-05 NOTE — Progress Notes (Signed)
Office Visit Note   Patient: Yolanda Espinoza           Date of Birth: 1955-04-16           MRN: [id number] Visit Date: 04/05/2020              Requested by: Garnet Sierras, NP [address] [place],  [place] [address] PCP: Liane Comber, NP   Assessment & Plan: Visit Diagnoses:  1. Pain in right hip     Plan: Impression is improving right hip pain.  It is hard to get a sense today as to whether the pain the patient is having is from her hip or her back as she is feeling so much better, however when I rolling her hip she notes that this would have significantly hurt prior to starting the steroids.  She is going to finish her steroid taper and once her pain returns follow-up with Dr. Junius Roads for ultrasound-guided cortisone injection.  Call with concerns or questions in the meantime.  Follow-Up Instructions: Return if symptoms worsen or fail to improve.   Orders:  No orders of the defined types were placed in this encounter.  No orders of the defined types were placed in this encounter.     Procedures: No procedures performed   Clinical Data: No additional findings.   Subjective: Chief Complaint  Patient presents with  . Right Hip - Pain    HPI patient is a very pleasant 65 year old female who comes in today with concerns about her right hip.  She has had significant pain to the groin radiating down to her knee since last Thursday.  No known injury or change in activity.  Today, her pain is actually minimal as she has been on a prednisone taper and muscle relaxers given to her by her PCP.  The pain she was having was primarily when sitting or lying too long or moving her hip in a certain way.  No paresthesias.  No previous lumbar hip pathology.  Review of Systems as detailed in HPI.  All others reviewed and are negative.   Objective: Vital Signs: There were no vitals taken for this visit.  Physical Exam well-developed well-nourished female no acute distress.   Alert oriented x3.  Ortho Exam right hip exam shows a minimally positive FADIR.  Negative logroll.  Negative straight leg raise.  She is neurovascular intact distally.  Specialty Comments:  No specialty comments available.  Imaging: No new imaging   PMFS History: Patient Active Problem List   Diagnosis Date Noted  . History of anemia due to chronic kidney disease 02/18/2020  . CKD stage G3b/A2, GFR 30-44 and albumin creatinine ratio 30-299 mg/g (HCC) 01/27/2020  . Deterioration in renal function 01/27/2020  . Allergic rhinitis 12/10/2019  . Persistent cough 12/10/2019  . Anemia 12/04/2019  . Gastroesophageal reflux disease 08/08/2017  . Seizure ([place])   . Gout 10/23/2014  . Morbid obesity ([place]) - BMI 30+ with OSA 04/01/2014  . Abnormal glucose 11/11/2013  . Medication management 11/11/2013  . Brain aneurysm 04/23/2013  . OSA on CPAP   . Vitamin D deficiency   . Hyperlipidemia   . Mitral regurgitation 06/25/2012  . Chronic diastolic heart failure ([place]) 06/25/2012  . Essential hypertension 02/14/2012   Past Medical History:  Diagnosis Date  . Anemia   . Arthritis   . Chronic diastolic heart failure ([place])   . GERD (gastroesophageal reflux disease)   . Heart murmur   . Hyperlipidemia   .  Hypertension may, 1973  . Mitral regurgitation    a. Echo 01/2012 mild LVH, EF 55-65%, Gr 2 DD, mod MR, mild LAE, PASP 36;  b. Echo (02/2013):  EF 55-60%, Gr 1 DD, mild to mod MR, mild LAE (no sig change since prior echo) // c. Echo 10/18: EF 55-60, normal wall motion, grade 2 diastolic dysfunction, trivial AI, mild to moderate MR, mild LAE, trivial PI, PASP 38    . OSA on CPAP    7 yrs  . Peripheral vascular disease ([place])    cerebral aneursym  . Seizures ([place]) 01/2017  . Stroke [facility]) 07   no weakness jan 29th 2019 no defecits  . Subarachnoid hemorrhage due to ruptured aneurysm ([place])    2003 - s/p coiling  . Unspecified vitamin D deficiency     Family History  Problem Relation Age of  Onset  . Hypertension Father   . Kidney disease Father   . Hypertension Sister   . Cancer Sister 83       Colon cancer  . Hypertension Sister   . Hypertension Sister     Past Surgical History:  Procedure Laterality Date  . ABDOMINAL HYSTERECTOMY     complete  . ANEURYSM COILING    . COLONOSCOPY WITH PROPOFOL N/A 09/05/2018   Procedure: COLONOSCOPY WITH PROPOFOL;  Surgeon: Juanita Craver, MD;  [facility];  Service: Endoscopy;  Laterality: N/A;  . ESOPHAGOGASTRODUODENOSCOPY (EGD) WITH PROPOFOL N/A 09/05/2018   Procedure: ESOPHAGOGASTRODUODENOSCOPY (EGD) WITH PROPOFOL;  Surgeon: Juanita Craver, MD;  Location[facility];  Service: Endoscopy;  Laterality: N/A;  . GIVENS CAPSULE STUDY N/A 10/17/2018   Procedure: GIVENS CAPSULE STUDY;  Surgeon: Juanita Craver, MD;  Location: [facility] ENDOSCOPY;  Service: Endoscopy;  Laterality: N/A;  . head surgery     aneurysm ruptured- stroke  . IR 3D INDEPENDENT WKST  09/04/2016  . IR ANGIO INTRA EXTRACRAN SEL COM CAROTID INNOMINATE BILAT MOD SED  09/04/2016  . IR ANGIO INTRA EXTRACRAN SEL INTERNAL CAROTID UNI L MOD SED  02/21/2017  . IR ANGIO VERTEBRAL SEL VERTEBRAL BILAT MOD SED  09/04/2016  . IR ANGIOGRAM EXTREMITY LEFT  09/04/2016  . IR ANGIOGRAM FOLLOW UP STUDY  02/21/2017  . IR NEURO EACH ADD'L AFTER BASIC UNI LEFT (MS)  02/21/2017  . IR RADIOLOGIST EVAL & MGMT  10/11/2016  . IR RADIOLOGIST EVAL & MGMT  03/07/2017  . IR TRANSCATH/EMBOLIZ  02/21/2017  . RADIOLOGY WITH ANESTHESIA N/A 04/23/2013   Procedure: RADIOLOGY WITH ANESTHESIA;  Surgeon: Rob Hickman, MD;  Location: [place];  Service: Radiology;  Laterality: N/A;  . RADIOLOGY WITH ANESTHESIA N/A 10/30/2016   Procedure: EMBOLIZATION;  Surgeon: Luanne Bras, MD;  Location: [place];  Service: Radiology;  Laterality: N/A;  . RADIOLOGY WITH ANESTHESIA N/A 02/21/2017   Procedure: EMBOLIZATION;  Surgeon: Luanne Bras, MD;  Location: [place];  Service: Radiology;  Laterality: N/A;  . TONSILLECTOMY  AND ADENOIDECTOMY    . TUBAL LIGATION     Social History   Occupational History    Comment: [employer], part time  Tobacco Use  . Smoking status: Former Smoker    Packs/day: 0.25    Years: 3.00    Pack years: 0.75    Types: Cigarettes    Quit date: 01/24/1999    Years since quitting: 21.2  . Smokeless tobacco: Never Used  Vaping Use  . Vaping Use: Never used  Substance and Sexual Activity  . Alcohol use: No  . Drug use: No  .  Sexual activity: Not Currently    Partners: Male    Birth control/protection: Post-menopausal

## 2020-04-06 ENCOUNTER — Other Ambulatory Visit (HOSPITAL_COMMUNITY): Payer: Self-pay | Admitting: Interventional Radiology

## 2020-04-06 DIAGNOSIS — I671 Cerebral aneurysm, nonruptured: Secondary | ICD-10-CM

## 2020-04-12 DIAGNOSIS — Z1159 Encounter for screening for other viral diseases: Secondary | ICD-10-CM | POA: Diagnosis not present

## 2020-04-12 DIAGNOSIS — Z20828 Contact with and (suspected) exposure to other viral communicable diseases: Secondary | ICD-10-CM | POA: Diagnosis not present

## 2020-04-19 ENCOUNTER — Encounter: Payer: Self-pay | Admitting: Neurology

## 2020-04-19 ENCOUNTER — Ambulatory Visit (INDEPENDENT_AMBULATORY_CARE_PROVIDER_SITE_OTHER): Payer: BC Managed Care – PPO | Admitting: Neurology

## 2020-04-19 VITALS — BP 159/94 | HR 74 | Ht 70.0 in | Wt 218.0 lb

## 2020-04-19 DIAGNOSIS — I10 Essential (primary) hypertension: Secondary | ICD-10-CM | POA: Diagnosis not present

## 2020-04-19 DIAGNOSIS — I609 Nontraumatic subarachnoid hemorrhage, unspecified: Secondary | ICD-10-CM

## 2020-04-19 DIAGNOSIS — G40909 Epilepsy, unspecified, not intractable, without status epilepticus: Secondary | ICD-10-CM

## 2020-04-19 DIAGNOSIS — G4719 Other hypersomnia: Secondary | ICD-10-CM

## 2020-04-19 DIAGNOSIS — R351 Nocturia: Secondary | ICD-10-CM

## 2020-04-19 NOTE — Progress Notes (Signed)
SLEEP MEDICINE CLINIC    Provider:  Larey Seat, MD  Primary Care Physician:  Liane Comber, NP [address] [place] [address]     Referring Provider: Unk Pinto, [place],  [place] [address]          Chief Complaint according to patient   Patient presents with:    . New Patient (Initial Visit)     Pt alone, rm 11. Pt of dr Leta Baptist- has had a PSG outside, in [place] over 10 years ago- the last CPAP was 65 years old, replaced without retesting- . She was using the phillips CPAP (which is less than 5 yrs) she was using the so clean machine and has had to stop using the machine.  She continues to have to wait for >1 yr for a replacement. DME [facility]      HISTORY OF PRESENT ILLNESS:  Florabel Frye is a 65 - year- old  African -American female patient and for the first timeseen here in the sleep clinic-  Upon a referral for a sleep consultation on 04/19/2020 from PCP.   Chief concern according to patient : " my machine is on recall and I used so-clean- and now I am very sleepy again, can't use my CPAP " I have fallen asleep at work".     JEMA DELCID has a past medical history of Anemia, Arthritis, Chronic diastolic heart failure ([place]), GERD (gastroesophageal reflux disease), Heart murmur, Hyperlipidemia, Hypertension (may, 1973), Mitral regurgitation, OSA on CPAP, Peripheral vascular disease ([place]), Seizures ([place]) (01/2017), Stroke ([place]) (07), Subarachnoid hemorrhage due to ruptured aneurysm ([place]), and Unspecified vitamin D deficiency.   The patient had the first sleep study in the year 2008 in [place], [place], and used CPAP since.    Sleep relevant medical history: Nocturia/3 or more , had Tonsillectomy at age 35 , had 76 brain aneurysms, 3 were coiled.   Family medical /sleep history: no  other family member on CPAP with OSA, insomnia, sleep walkers.    Social history:  Patient is working as Corporate investment banker at [employer],  and lives in a household with spouse and son-. The patient currently works the morning shift 7-11 AM.  Tobacco use- over 30 years ago. ETOH use ; none , Caffeine intake in form of Coffee( 1 cup in AM ) Soda(pepsi 3 a week) . Regular exercise in form of walking       Sleep habits are as follows: The patient's dinner time is between 4 PM. The patient goes to bed at 9 PM and continues to sleep for 6 hours, wakes for 3-4 bathroom breaks, the first time at 12 AM.   The preferred sleep position is supine , with the support of 1 pillow. Dreams are reportedly rare.Marland Kitchen  4  AM is the usual rise time. The patient wakes up spontaneously before her alarm.  She reports not feeling refreshed or restored in AM, with symptoms such as dry mouth, no morning headaches Naps are taken frequently after work- she can sleep hours and sometimes is waking up in late PM  to go to bed. She snores lous dly and her son has witnessed apnea.    Review of Systems: Out of a complete 14 system review, the patient complains of only the following symptoms, and all other reviewed systems are negative.:  Fatigue, sleepiness , loud snoring, fragmented sleep, nocturia.   How likely are you to  doze in the following situations: 0 = not likely, 1 = slight chance, 2 = moderate chance, 3 = high chance   Sitting and Reading? Watching Television? Sitting inactive in a public place (theater or meeting)? As a passenger in a car for an hour without a break? Lying down in the afternoon when circumstances permit? Sitting and talking to someone? Sitting quietly after lunch without alcohol? In a car, while stopped for a few minutes in traffic?   Total =14/ 24 points   FSS endorsed at 13/ 63 points.   Social History   Socioeconomic History  . Marital status: Married    Spouse name: Not on file  . Number of children: 2  . Years of education: 65  . Highest education level: Not on file  Occupational History     Comment: [employer], part time  Tobacco Use  . Smoking status: Former Smoker    Packs/day: 0.25    Years: 3.00    Pack years: 0.75    Types: Cigarettes    Quit date: 01/24/1999    Years since quitting: 21.2  . Smokeless tobacco: Never Used  Vaping Use  . Vaping Use: Never used  Substance and Sexual Activity  . Alcohol use: No  . Drug use: No  . Sexual activity: Not Currently    Partners: Male    Birth control/protection: Post-menopausal  Other Topics Concern  . Not on file  Social History Narrative   Lives with husband   Caffeine- maybe 1/2 soda, 1 1/2 cups coffee   Social Determinants of Health   Financial Resource Strain: Not on file  Food Insecurity: Not on file  Transportation Needs: Not on file  Physical Activity: Not on file  Stress: Not on file  Social Connections: Not on file    Family History  Problem Relation Age of Onset  . Hypertension Father   . Kidney disease Father   . Hypertension Sister   . Cancer Sister 14       Colon cancer  . Hypertension Sister   . Hypertension Sister     Past Medical History:  Diagnosis Date  . Anemia   . Arthritis   . Chronic diastolic heart failure ([place])   . GERD (gastroesophageal reflux disease)   . Heart murmur   . Hyperlipidemia   . Hypertension may, 1973  . Mitral regurgitation    a. Echo 01/2012 mild LVH, EF 55-65%, Gr 2 DD, mod MR, mild LAE, PASP 36;  b. Echo (02/2013):  EF 55-60%, Gr 1 DD, mild to mod MR, mild LAE (no sig change since prior echo) // c. Echo 10/18: EF 55-60, normal wall motion, grade 2 diastolic dysfunction, trivial AI, mild to moderate MR, mild LAE, trivial PI, PASP 38    . OSA on CPAP    7 yrs  . Peripheral vascular disease ([place])    cerebral aneursym  . Seizures ([place]) 01/2017  . Stroke [facility]) 07   no weakness jan 29th 2019 no defecits  . Subarachnoid hemorrhage due to ruptured aneurysm ([place])    2003 - s/p coiling  . Unspecified vitamin D deficiency     Past Surgical History:  Procedure  Laterality Date  . ABDOMINAL HYSTERECTOMY     complete  . ANEURYSM COILING    . COLONOSCOPY WITH PROPOFOL N/A 09/05/2018   Procedure: COLONOSCOPY WITH PROPOFOL;  Surgeon: Juanita Craver, MD;  Location: [facility] ENDOSCOPY;  Service: Endoscopy;  Laterality: N/A;  . ESOPHAGOGASTRODUODENOSCOPY (EGD) WITH PROPOFOL N/A 09/05/2018  Procedure: ESOPHAGOGASTRODUODENOSCOPY (EGD) WITH PROPOFOL;  Surgeon: Juanita Craver, MD;  Location: [facility] ENDOSCOPY;  Service: Endoscopy;  Laterality: N/A;  . GIVENS CAPSULE STUDY N/A 10/17/2018   Procedure: GIVENS CAPSULE STUDY;  Surgeon: Juanita Craver, MD;  Location: [facility] ENDOSCOPY;  Service: Endoscopy;  Laterality: N/A;  . head surgery     aneurysm ruptured- stroke  . IR 3D INDEPENDENT WKST  09/04/2016  . IR ANGIO INTRA EXTRACRAN SEL COM CAROTID INNOMINATE BILAT MOD SED  09/04/2016  . IR ANGIO INTRA EXTRACRAN SEL INTERNAL CAROTID UNI L MOD SED  02/21/2017  . IR ANGIO VERTEBRAL SEL VERTEBRAL BILAT MOD SED  09/04/2016  . IR ANGIOGRAM EXTREMITY LEFT  09/04/2016  . IR ANGIOGRAM FOLLOW UP STUDY  02/21/2017  . IR NEURO EACH ADD'L AFTER BASIC UNI LEFT (MS)  02/21/2017  . IR RADIOLOGIST EVAL & MGMT  10/11/2016  . IR RADIOLOGIST EVAL & MGMT  03/07/2017  . IR TRANSCATH/EMBOLIZ  02/21/2017  . RADIOLOGY WITH ANESTHESIA N/A 04/23/2013   Procedure: RADIOLOGY WITH ANESTHESIA;  Surgeon: Rob Hickman, MD;  Location: [facility];  Service: Radiology;  Laterality: N/A;  . RADIOLOGY WITH ANESTHESIA N/A 10/30/2016   Procedure: EMBOLIZATION;  Surgeon: Luanne Bras, MD;  Location: [facility];  Service: Radiology;  Laterality: N/A;  . RADIOLOGY WITH ANESTHESIA N/A 02/21/2017   Procedure: EMBOLIZATION;  Surgeon: Luanne Bras, MD;  Location: [facility];  Service: Radiology;  Laterality: N/A;  . TONSILLECTOMY AND ADENOIDECTOMY    . TUBAL LIGATION       Current Outpatient Medications on File Prior to Visit  Medication Sig Dispense Refill  . allopurinol (ZYLOPRIM) 100 MG tablet Take     1 tablet    Daily     to  Prevent Gout 90 tablet 0  . amLODipine (NORVASC) 10 MG tablet TAKE 1 TABLET BY MOUTH DAILY FOR BLOOD PRESSURE 90 tablet 1  . aspirin EC 81 MG tablet Take 1 tablet (81 mg total) by mouth daily.    . Calcium Carb-Cholecalciferol (CALCIUM 600 + D PO) Take 1 tablet by mouth daily.    . cholecalciferol (VITAMIN D3) 25 MCG (1000 UT) tablet Take 1,000 Units by mouth daily.    . cyclobenzaprine (FLEXERIL) 10 MG tablet TAKE 1 TABLET BY MOUTH EVERY NIGHT AS NEEDED AT BEDTIME FOR MUSCLE SPASMS 90 tablet 0  . diclofenac (VOLTAREN) 75 MG EC tablet Take 1 tablet    2 x /day    with Food for Pain & Inflammation 180 tablet 0  . diclofenac sodium (VOLTAREN) 1 % GEL Apply 2 g topically 4 (four) times daily. Right wrist and Right elbow 3 Tube 2  . estrogens, conjugated, (PREMARIN) 0.625 MG tablet Take 1 tablet Daily for Hormones 90 tablet 0  . famotidine (PEPCID) 20 MG tablet TAKE 1 TABLET BY MOUTH EVERY MORNING 30-60 MINUTES PRIOR TO BREAKFAST FOR REFLUX 90 tablet 0  . ferrous sulfate 325 (65 FE) MG tablet Take 325 mg by mouth daily with breakfast.    . fexofenadine (ALLEGRA ALLERGY) 180 MG tablet Take 1 tablet (180 mg total) by mouth daily. 30 tablet 2  . furosemide (LASIX) 40 MG tablet Take 1 tablet Daily for BP, Fluid Retention & Ankle Swelling 90 tablet 1  . levETIRAcetam (KEPPRA) 1000 MG tablet TAKE ONE (1) TABLET BY MOUTH TWICE DAILY TO PREVENT SEIZURES 180 tablet 1  . Multiple Vitamins-Minerals (MULTIVITAMIN WITH MINERALS) tablet Take 1 tablet by mouth daily.    Marland Kitchen nystatin (NYSTATIN) powder APPLY TOPICALLY WITH BRUSH TO  AREA AFTER A SHOWER TO PREVENT INFECTION 30 g 0  . nystatin cream (MYCOSTATIN) APPLY 1 APPLICATION TOPICALLY TO RASH TWICE DAILY 30 g 1  . olmesartan (BENICAR) 40 MG tablet Take 1 tablet Daily for BP 90 tablet 0  . Omega-3 Fatty Acids (FISH OIL) 1000 MG CAPS Take 1,000 mg by mouth daily.    . pantoprazole (PROTONIX) 40 MG tablet TAKE 1 TABLET BY MOUTH DAILY IN THE EVENING TO PREVENT  INDIGESTION AND HEARTBURN 90 tablet 0  . predniSONE (STERAPRED UNI-PAK 21 TAB) 10 MG (21) TBPK tablet Take by mouth daily. Follow directions on Taper pack 21 tablet 0  . ticagrelor (BRILINTA) 90 MG TABS tablet Take 1 tablet 2 x /day to Prevent Blood Clots 180 tablet 1  . vitamin B-12 (CYANOCOBALAMIN) 500 MCG tablet Take 1,000 mcg by mouth daily.     No current facility-administered medications on file prior to visit.    Allergies  Allergen Reactions  . Ace Inhibitors Nausea And Vomiting  . Aspirin Nausea And Vomiting    Can take coated Aspirin    Physical exam:  Today's Vitals   04/19/20 0843  BP: (!) 159/94  Pulse: 74  Weight: 218 lb (98.9 kg)  Height: '5\' 10"'$  (1.778 m)   Body mass index is 31.28 kg/m.   Wt Readings from Last 3 Encounters:  04/19/20 218 lb (98.9 kg)  03/30/20 217 lb (98.4 kg)  03/21/20 213 lb 10 oz (96.9 kg)     Ht Readings from Last 3 Encounters:  04/19/20 '5\' 10"'$  (1.778 m)  03/30/20 '5\' 10"'$  (1.778 m)  03/21/20 '5\' 10"'$  (1.778 m)      General: The patient is awake, alert and appears not in acute distress. The patient is well groomed. Head: Normocephalic, atraumatic. Neck is supple.  Mallampati 3 plus ,  neck circumference:15.5 inches . Nasal airflow barely patent.  Retrognathia is not seen.  Dental status: dentures.  Cardiovascular:  Regular rate and cardiac rhythm by pulse,  without distended neck veins. Respiratory: Lungs are clear to auscultation.  Skin:  Without evidence of ankle edema, or rash. Trunk: The patient's posture is erect.   Neurologic exam : The patient is awake and alert, oriented to place and time.   Memory subjective described as intact.  Attention span & concentration ability appears normal.  Speech is fluent,  without  dysarthria, dysphonia or aphasia.  Mood and affect are appropriate.   Cranial nerves: no loss of smell or taste reported  Pupils are equal and briskly reactive to light. Funduscopic exam deferred. .   Extraocular movements in vertical and horizontal planes were intact and without nystagmus. No Diplopia. Visual fields by finger perimetry are intact. Hearing was intact to soft voice and finger rubbing.    Facial sensation intact to fine touch.  Facial motor strength is symmetric and tongue and uvula move midline.  Neck ROM : rotation, tilt and flexion extension were normal for age and shoulder shrug was symmetrical.    Motor exam:  Symmetric bulk, tone and ROM.   Normal tone without cog-wheeling, symmetric grip strength .   Sensory:  Fine touch, pinprick and vibration were normal. Left foot and left hands tingle.Marland Kitchen  Proprioception tested in the upper extremities was normal.   Coordination: Rapid alternating movements in the fingers/hands were of normal speed.  The Finger-to-nose maneuver was intact without evidence of ataxia, dysmetria or tremor.   Gait and station: Patient could rise unassisted from a seated position, walked without assistive device.  Stance is of normal width/ base and the patient turned with 3 steps.  Toe and heel walk were deferred.  Deep tendon reflexes: in the  upper and lower extremities are symmetric and intact.  Babinski response was deferred.      After spending a total time of  45  minutes face to face and additional time for physical and neurologic examination, review of laboratory studies,  personal review of imaging studies, reports and results of other testing and review of referral information / records as far as provided in visit, I have established the following assessments:  1)  Excessive daytime sleepiness,  Also she works now part time, she has fallen asleep at work. Attributes this  to seizure medication and to her untreated OSA.  2) has witnessed apnea , snoring.  3) Retesting needed. EEG for seizure patient.   My Plan is to proceed with:  1) Mrs. Loyal Buba would be a good candidate for an attended sleep study and I would like for her to have an  expanded EEG montage.  My goal would be a split-night and I would split her even if she has only 20 apneas per hour and without significant oxygen desaturation the goal is to just establish a baseline and establish a therapeutic range of pressure while also having the benefit of an attached EEG.  If this cannot be permitted or is not authorized by her insurance I will order a home sleep test instead just for screening for the current level of apnea.  She truly has high blood pressure today was 159/94, as many people in the pandemic there is some weight gain.  There is also excessive daytime sleepiness not just from untreated apnea but from her side effects of seizure medication and Flexeril ( Dr Leta Baptist).   I would like to thank Liane Comber, NP and Unk Pinto, [address] [place],  [place] [id number] for allowing me to meet with and to take care of this pleasant patient.   In short, ELIANAH BURROWES is presenting with EDS.  I plan to follow up either personally or through our NP within 2-4 month.   CC: I will share my notes with PCP and Dr Leta Baptist.  Electronically signed by: Larey Seat, MD 04/19/2020 9:12 AM  [facility] Board certified by The [employer] of Sleep Medicine and Diplomate of the [employer] of Sleep Medicine. Board certified In Neurology through the [place], Fellow of the [employer] of Neurology. Medical Director of [facility].

## 2020-04-19 NOTE — Patient Instructions (Signed)
Hypersomnia Hypersomnia is a condition in which a person feels very tired during the day even though he or she gets plenty of sleep at night. A person with this condition may take naps during the day and may find it very difficult to wake up from sleep. Hypersomnia may affect a person's ability to think, concentrate, drive, or remember things. What are the causes? The cause of this condition may not be known. Possible causes include:  Certain medicines.  Sleep disorders, such as narcolepsy and sleep apnea.  Injury to the head, brain, or spinal cord.  Drug or alcohol use.  Gastroesophageal reflux disease (GERD).  Tumors.  Certain medical conditions, such as depression, diabetes, or an underactive thyroid gland (hypothyroidism). What are the signs or symptoms? The main symptoms of hypersomnia include:  Feeling very tired throughout the day, regardless of how much sleep you got the night before.  Having trouble waking up. Others may find it difficult to wake you up when you are sleeping.  Sleeping for longer and longer periods at a time.  Taking naps throughout the day. Other symptoms may include:  Feeling restless, anxious, or annoyed.  Lacking energy.  Having trouble with: ? Remembering. ? Speaking. ? Thinking.  Loss of appetite.  Seeing, hearing, tasting, smelling, or feeling things that are not real (hallucinations). How is this diagnosed? This condition may be diagnosed based on:  Your symptoms and medical history.  Your sleeping habits. Your health care provider may ask you to write down your sleeping habits in a daily sleep log, along with any symptoms you have.  A series of tests that are done while you sleep (sleep study or polysomnogram).  A test that measures how quickly you can fall asleep during the day (daytime nap study or multiple sleep latency test). How is this treated? Treatment can help you manage your condition. Treatment may  include:  Following a regular sleep routine.  Lifestyle changes, such as changing your eating habits, getting regular exercise, and avoiding alcohol or caffeinated beverages.  Taking medicines to make you more alert (stimulants) during the day.  Treating any underlying medical causes of hypersomnia. Follow these instructions at home: Sleep routine  Schedule the same bedtime and wake-up time each day.  Practice a relaxing bedtime routine. This may include reading, meditation, deep breathing, or taking a warm bath before going to sleep.  Get regular exercise each day. Avoid strenuous exercise in the evening hours.  Keep your sleep environment at a cooler temperature, darkened, and quiet.  Sleep with pillows and a mattress that are comfortable and supportive.  Schedule short 20-minute naps for when you feel sleepiest during the day.  Talk with your employer or teachers about your hypersomnia. If possible, adjust your schedule so that: ? You have a regular daytime work schedule. ? You can take a scheduled nap during the day. ? You do not have to work or be active at night.  Do not eat a heavy meal for a few hours before bedtime. Eat your meals at about the same times every day.  Avoid drinking alcohol or caffeinated beverages.   Safety  Do not drive or use heavy machinery if you are sleepy. Ask your health care provider if it is safe for you to drive.  Wear a life jacket when swimming or spending time near water.   General instructions  Take supplements and over-the-counter and prescription medicines only as told by your health care provider.  Keep a sleep log that   will help your doctor manage your condition. This may include information about: ? What time you go to bed each night. ? How often you wake up at night. ? How many hours you sleep at night. ? How often and for how long you nap during the day. ? Any observations from others, such as leg movements during sleep,  sleep walking, or snoring.  Keep all follow-up visits as told by your health care provider. This is important. Contact a health care provider if:  You have new symptoms.  Your symptoms get worse. Get help right away if:  You have serious thoughts about hurting yourself or someone else. If you ever feel like you may hurt yourself or others, or have thoughts about taking your own life, get help right away. You can go to your nearest emergency department or call:  Your local emergency services (911 in the [place]  A suicide crisis helpline, such as the [facility] at 830-765-0988. This is open 24 hours a day. Summary  Hypersomnia refers to a condition in which you feel very tired during the day even though you get plenty of sleep at night.  A person with this condition may take naps during the day and may find it very difficult to wake up from sleep.  Hypersomnia may affect a person's ability to think, concentrate, drive, or remember things.  Treatment, such as following a regular sleep routine and making some lifestyle changes, can help you manage your condition. This information is not intended to replace advice given to you by your health care provider. Make sure you discuss any questions you have with your health care provider. Document Revised: 11/20/2019 Document Reviewed: 11/20/2019 Elsevier Patient Education  2021 [place] for Sleep Apnea  Sleep apnea is a condition in which breathing pauses or becomes shallow during sleep. Sleep apnea screening is a test to determine if you are at risk for sleep apnea. The test is easy and only takes a few minutes. Your health care provider may ask you to have this test in preparation for surgery or as part of a physical exam. What are the symptoms of sleep apnea? Common symptoms of sleep apnea include:  Snoring.  Restless sleep.  Daytime sleepiness.  Pauses in breathing.  Choking during  sleep.  Irritability.  Forgetfulness.  Trouble thinking clearly.  Depression.  Personality changes. Most people with sleep apnea are not aware that they have it. Why should I get screened? Getting screened for sleep apnea can help:  Ensure your safety. It is important for your health care providers to know whether or not you have sleep apnea, especially if you are having surgery or have other long-term (chronic) health conditions.  Improve your health and allow you to get a better night's rest. Restful sleep can help you: ? Have more energy. ? Lose weight. ? Improve high blood pressure. ? Improve diabetes management. ? Prevent stroke. ? Prevent car accidents. How is screening done? Screening usually includes being asked a list of questions about your sleep quality. Some questions you may be asked include:  Do you snore?  Is your sleep restless?  Do you have daytime sleepiness?  Has a partner or spouse told you that you stop breathing during sleep?  Have you had trouble concentrating or memory loss? If your screening test is positive, you are at risk for the condition. Further testing may be needed to confirm a diagnosis of sleep apnea. Where to find more information  You can find screening tools online or at your health care clinic. For more information about sleep apnea screening and healthy sleep, visit these websites:  Centers for Disease Control and Prevention: LearningDermatology.pl  American Sleep Apnea Association: www.sleepapnea.org Contact a health care provider if:  You think that you may have sleep apnea. Summary  Sleep apnea screening can help determine if you are at risk for sleep apnea.  It is important for your health care providers to know whether or not you have sleep apnea, especially if you are having surgery or have other chronic health conditions.  You may be asked to take a screening test for sleep apnea in preparation for surgery or as  part of a physical exam. This information is not intended to replace advice given to you by your health care provider. Make sure you discuss any questions you have with your health care provider. Document Revised: 10/26/2017 Document Reviewed: 04/21/2016 Elsevier Patient Education  [place].

## 2020-04-22 ENCOUNTER — Ambulatory Visit (HOSPITAL_COMMUNITY): Payer: BC Managed Care – PPO

## 2020-04-22 ENCOUNTER — Ambulatory Visit (HOSPITAL_COMMUNITY): Admission: RE | Admit: 2020-04-22 | Payer: BC Managed Care – PPO | Source: Ambulatory Visit

## 2020-04-22 DIAGNOSIS — Z1159 Encounter for screening for other viral diseases: Secondary | ICD-10-CM | POA: Diagnosis not present

## 2020-04-22 DIAGNOSIS — Z20828 Contact with and (suspected) exposure to other viral communicable diseases: Secondary | ICD-10-CM | POA: Diagnosis not present

## 2020-04-29 DIAGNOSIS — Z20828 Contact with and (suspected) exposure to other viral communicable diseases: Secondary | ICD-10-CM | POA: Diagnosis not present

## 2020-04-29 DIAGNOSIS — Z1159 Encounter for screening for other viral diseases: Secondary | ICD-10-CM | POA: Diagnosis not present

## 2020-04-30 ENCOUNTER — Other Ambulatory Visit: Payer: Self-pay

## 2020-04-30 ENCOUNTER — Ambulatory Visit (HOSPITAL_COMMUNITY)
Admission: RE | Admit: 2020-04-30 | Discharge: 2020-04-30 | Disposition: A | Discharge status: Home / Self Care | Payer: BC Managed Care – PPO | Source: Ambulatory Visit | Attending: Interventional Radiology | Admitting: Interventional Radiology

## 2020-04-30 DIAGNOSIS — I671 Cerebral aneurysm, nonruptured: Secondary | ICD-10-CM

## 2020-04-30 DIAGNOSIS — I720 Aneurysm of carotid artery: Secondary | ICD-10-CM | POA: Diagnosis not present

## 2020-05-03 ENCOUNTER — Other Ambulatory Visit: Payer: Self-pay | Admitting: Adult Health

## 2020-05-03 DIAGNOSIS — Z20828 Contact with and (suspected) exposure to other viral communicable diseases: Secondary | ICD-10-CM | POA: Diagnosis not present

## 2020-05-03 DIAGNOSIS — Z1159 Encounter for screening for other viral diseases: Secondary | ICD-10-CM | POA: Diagnosis not present

## 2020-05-04 ENCOUNTER — Telehealth (HOSPITAL_COMMUNITY): Payer: Self-pay

## 2020-05-04 NOTE — Telephone Encounter (Signed)
Pt agreed to f/u in 1 year with mri/mra. AW  

## 2020-05-04 NOTE — Progress Notes (Deleted)
3 MONTH FOLLOW UP  Assessment and Plan:   Chronic diastolic heart failure Emphasized need for weight loss  Emphasized salt restriction, less than '2000mg'$  a day. Encouraged daily monitoring of the patient's weight, call office if 3 lb weight loss or gain in a day.  Encouraged regular exercise.  decrease your fluid intake to less than 2 L daily  Cardiology is managing; appears euvolemic today    Mitral regurgitation Monitored by cardio, moderate per recent ECHO/MRI, no SOB/CP  Essential hypertension - continue medications, DASH diet, exercise and monitor at home. Call if greater than 130/80.  - CBC with Differential/Platelet - CMP/GFR - TSH  Abnormal glucose Recent A1Cs at goal Discussed diet/exercise, weight management  Defer A1C; check CMP  Hyperlipidemia -continue medications, check lipids, decrease fatty foods, increase activity. - Lipid panel  Vitamin D deficiency Continue supplement  Medication management - Magnesium   Brain aneurysm Dr. Estanislado Pandy follows  S/p coil, on brilinta Control blood pressure, cholesterol, glucose, increase exercise.    Seizures (HCC) R/t CVA/aneurysm; Dr. Estanislado Pandy following, on keppra, he checks levels annually  Morbid obesity ([place]) - BMI 30+ with OSA - long discussion about weight loss, diet, and exercise -recommended diet heavy in fruits and veggies and low in animal meats, cheeses, and dairy products  Anemia of chronic renal disease Had full anemia workup; monitor; refer for EPO if trending down   Persistent cough Cough- multifactorial-  *** Continue PPI, raise HOB on blocks, GERD diet, no improvement with antihistamine and adding AM dosing; get CXR  Then if clear proceed with ENT evaluation for laryngoscopy  Also possible mild dysphagia leading to daytime cough with drinking/eating - consider speech pathology referral if cough not improving  OSA on CPAP Significant difficulty getting supplies, CPAP was recalled, likely needs  new sleep study After discussion with pt and referral coordinator will proceed with referal to sleep specialist for further management  No orders of the defined types were placed in this encounter.  Discussed med's effects and SE's. Screening labs and tests as requested with regular follow-up as recommended. Over 40 minutes of exam, counseling, chart review, and complex, high level critical decision making was performed this visit.  Future Appointments  Date Time Provider [place]  05/05/2020 11:00 AM Liane Comber, NP [facility] None  05/05/2020  8:00 PM [facility] None  05/18/2020  8:30 AM Penumalli, Earlean Polka, MD [facility] None  08/04/2020 11:30 AM Liane Comber, NP [facility] None  01/26/2021 10:00 AM Liane Comber, NP [facility] None    HPI  65 y.o. female  presents for 3 month follow up for htn, CHF, OSA, aneurysm, morbid obesity, gout, anemia, CKD III.    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    She was admitted 01/2017 for left ICA aneursym for a pipeline stent but had a seizure and AMS after her procedure, she is on brilinta, following with Dr. Corena Pilgrim, on keppra 1000 mg BID (he checks levels annually). Mas mild residual R arm weakness and some spasticity. Had follow up MRI/MRI 03/05/2019 which showed essentially unchanged from 01/2018 CT but did show possible chronic small vessel ischemic disease. Has annual MRI planned.   She has been on CPAP since 2008, CPAP machine was recalled and referred to Dr. Brett Fairy for repeat testing and replacement ***  She has reported persistent dry cough, triggered by inhalation, drinking water  or any food intake, but notably worse at night. She reports coughing with drinking or food is ongoing since her aneurysm/stroke/seizure, but has noted worse coughing at night ? Became worse after she stopped  using CPAP machine. She also admits to allergy sx, post nasal drip, AM thick secretions, she did start daily antihistamine without benefit. She also has GERD, taking pantoprazole 40 mg at night, but questions if she may be having some breakthrough. ***  She did have recent EGD in 10/31/2018 by Dr. Collene Mares for iron def anemia that showed ? Mild erosion vs AVM. Patient denies burning, belching, bloating.   she is on estrogen pills has been taking premarin 0.9 mg for over 10 years. Reports occasional hot flashes but not bad, unsure if this ever helped with hot flashes, interested in coming off of med ***  BMI is There is no height or weight on file to calculate BMI., she has been working on diet and exercise. Wt Readings from Last 3 Encounters:  04/19/20 218 lb (98.9 kg)  03/30/20 217 lb (98.4 kg)  03/21/20 213 lb 10 oz (96.9 kg)   PVD and CHF MR, follows with Dr. Sondra Come.  Had ECHO 07/2019 showing normal LV function, Mod MR.  Her blood pressure has been controlled at home, states has been good at home, today their BP is   She does workout, walks daily for 30 mins. She denies chest pain, shortness of breath, dizziness.   She is not on cholesterol medication and denies myalgias. Her cholesterol is at goal. The cholesterol last visit was:   Lab Results  Component Value Date   CHOL 215 (H) 01/26/2020   HDL 106 01/26/2020   LDLCALC 63 01/26/2020   TRIG 379 (H) 01/26/2020   CHOLHDL 2.0 01/26/2020   She has been working on diet and exercise for glucose management, and denies paresthesia of the feet, polydipsia, polyuria and visual disturbances. Last A1C in the office was:  Lab Results  Component Value Date   HGBA1C 5.5 01/26/2020    Patient is NOT on allopurinol for gout, she is on colchicine and does not report a recent flare.  Lab Results  Component Value Date   LABURIC 6.4 01/26/2020   Patient is on Vitamin D supplement, taking 2 caps of unsure dose ? 5000 IU Lab Results  Component Value  Date   VD25OH 81 (L) 01/26/2020      She has persistent anemia, was recently referred to evaluation by hematology with unremarkable workup including BMB; concluded anemia of chronic renal disease and recommended PRN follow up.  CBC Latest Ref Rng & Units 03/30/2020 03/21/2020 02/18/2020  WBC 3.8 - 10.8 Thousand/uL 8.3 7.6 7.0  Hemoglobin 11.7 - 15.5 g/dL 9.5(L) 9.5(L) 9.2(L)  Hematocrit 35.0 - 45.0 % 30.1(L) 29.7(L) 29.5(L)  Platelets 140 - 400 Thousand/uL 303 305 282   CKD III with mod microalbuminuria, likely secondary to htn, on olmesartan *** Lab Results  Component Value Date   GFRAA 46 (L) 03/30/2020   Lab Results  Component Value Date   CALCIUM 9.9 03/30/2020   CAION 1.19 05/07/2014     Current Medications:   Current Outpatient Medications (Endocrine & Metabolic):  .  estrogens, conjugated, (PREMARIN) 0.625 MG tablet, Take 1 tablet Daily for Hormones .  predniSONE (STERAPRED UNI-PAK 21 TAB) 10 MG (21) TBPK tablet, Take by mouth daily. Follow directions on Taper pack  Current Outpatient Medications (Cardiovascular):  .  amLODipine (NORVASC) 10 MG tablet, TAKE 1 TABLET BY MOUTH DAILY  FOR BLOOD PRESSURE .  furosemide (LASIX) 40 MG tablet, Take 1 tablet Daily for BP, Fluid Retention & Ankle Swelling .  olmesartan (BENICAR) 40 MG tablet, Take  1 tablet  Daily  for BP  Current Outpatient Medications (Respiratory):  .  fexofenadine (ALLEGRA ALLERGY) 180 MG tablet, Take 1 tablet (180 mg total) by mouth daily.  Current Outpatient Medications (Analgesics):  .  allopurinol (ZYLOPRIM) 100 MG tablet, Take     1 tablet    Daily     to Prevent Gout .  aspirin EC 81 MG tablet, Take 1 tablet (81 mg total) by mouth daily. .  diclofenac (VOLTAREN) 75 MG EC tablet, Take 1 tablet    2 x /day    with Food for Pain & Inflammation  Current Outpatient Medications (Hematological):  .  ferrous sulfate 325 (65 FE) MG tablet, Take 325 mg by mouth daily with breakfast. .  ticagrelor (BRILINTA) 90 MG  TABS tablet, Take 1 tablet 2 x /day to Prevent Blood Clots .  vitamin B-12 (CYANOCOBALAMIN) 500 MCG tablet, Take 1,000 mcg by mouth daily.  Current Outpatient Medications (Other):  Marland Kitchen  Calcium Carb-Cholecalciferol (CALCIUM 600 + D PO), Take 1 tablet by mouth daily. .  cholecalciferol (VITAMIN D3) 25 MCG (1000 UT) tablet, Take 1,000 Units by mouth daily. .  cyclobenzaprine (FLEXERIL) 10 MG tablet, TAKE 1 TABLET BY MOUTH EVERY NIGHT AS NEEDED AT BEDTIME FOR MUSCLE SPASMS .  diclofenac sodium (VOLTAREN) 1 % GEL, Apply 2 g topically 4 (four) times daily. Right wrist and Right elbow .  famotidine (PEPCID) 20 MG tablet, TAKE 1 TABLET BY MOUTH EVERY MORNING 30-60 MINUTES PRIOR TO BREAKFAST FOR REFLUX .  levETIRAcetam (KEPPRA) 1000 MG tablet, TAKE ONE (1) TABLET BY MOUTH TWICE DAILY TO PREVENT SEIZURES .  Multiple Vitamins-Minerals (MULTIVITAMIN WITH MINERALS) tablet, Take 1 tablet by mouth daily. Marland Kitchen  nystatin (NYSTATIN) powder, APPLY TOPICALLY WITH BRUSH TO AREA AFTER A SHOWER TO PREVENT INFECTION .  nystatin cream (MYCOSTATIN), APPLY 1 APPLICATION TOPICALLY TO RASH TWICE DAILY .  Omega-3 Fatty Acids (FISH OIL) 1000 MG CAPS, Take 1,000 mg by mouth daily. .  pantoprazole (PROTONIX) 40 MG tablet, TAKE 1 TABLET BY MOUTH DAILY IN THE EVENING TO PREVENT INDIGESTION AND HEARTBURN  Medical History:  Past Medical History:  Diagnosis Date  . Anemia   . Arthritis   . Chronic diastolic heart failure ([place])   . GERD (gastroesophageal reflux disease)   . Heart murmur   . Hyperlipidemia   . Hypertension may, 1973  . Mitral regurgitation    a. Echo 01/2012 mild LVH, EF 55-65%, Gr 2 DD, mod MR, mild LAE, PASP 36;  b. Echo (02/2013):  EF 55-60%, Gr 1 DD, mild to mod MR, mild LAE (no sig change since prior echo) // c. Echo 10/18: EF 55-60, normal wall motion, grade 2 diastolic dysfunction, trivial AI, mild to moderate MR, mild LAE, trivial PI, PASP 38    . OSA on CPAP    7 yrs  . Peripheral vascular disease ([place])     cerebral aneursym  . Seizures ([place]) 01/2017  . Stroke [facility]) 07   no weakness jan 29th 2019 no defecits  . Subarachnoid hemorrhage due to ruptured aneurysm ([place])    2003 - s/p coiling  . Unspecified vitamin D deficiency    Allergies Allergies  Allergen Reactions  . Ace Inhibitors Nausea And Vomiting  . Aspirin Nausea And Vomiting    Can take coated Aspirin  SURGICAL HISTORY She  has a past surgical history that includes Aneurysm coiling; head surgery; Tonsillectomy and adenoidectomy; Radiology with anesthesia (N/A, 04/23/2013); IR 3D Independent Wkst (09/04/2016); IR Angiogram Extremity Left (09/04/2016); IR ANGIO INTRA EXTRACRAN SEL COM CAROTID INNOMINATE BILAT MOD SED (09/04/2016); IR ANGIO VERTEBRAL SEL VERTEBRAL BILAT MOD SED (09/04/2016); IR Radiologist Eval & Mgmt (10/11/2016); Radiology with anesthesia (N/A, 10/30/2016); Tubal ligation; Radiology with anesthesia (N/A, 02/21/2017); IR Transcath/Emboliz (02/21/2017); IR ANGIO INTRA EXTRACRAN SEL INTERNAL CAROTID UNI L MOD SED (02/21/2017); IR NEURO EACH ADD'L AFTER BASIC UNI LEFT (MS) (02/21/2017); IR Angiogram Follow Up Study (02/21/2017); IR Radiologist Eval & Mgmt (03/07/2017); Abdominal hysterectomy; Esophagogastroduodenoscopy (egd) with propofol (N/A, 09/05/2018); Colonoscopy with propofol (N/A, 09/05/2018); and Givens capsule study (N/A, 10/17/2018).   FAMILY HISTORY Her family history includes Cancer (age of onset: 56) in her sister; Hypertension in her father, sister, sister, and sister; Kidney disease in her father.   SOCIAL HISTORY She  reports that she quit smoking about 21 years ago. Her smoking use included cigarettes. She has a 0.75 pack-year smoking history. She has never used smokeless tobacco. She reports that she does not drink alcohol and does not use drugs.  ***  Review of Systems: Review of Systems  Constitutional: Negative for malaise/fatigue and weight loss.  HENT: Negative for hearing loss and tinnitus.   Eyes: Negative  for blurred vision and double vision.  Respiratory: Positive for cough. Negative for sputum production, shortness of breath and wheezing.   Cardiovascular: Negative for chest pain, palpitations, orthopnea, claudication and leg swelling.  Gastrointestinal: Negative for abdominal pain, blood in stool, constipation, diarrhea, heartburn, melena, nausea and vomiting.  Genitourinary: Negative.   Musculoskeletal: Negative for joint pain and myalgias.  Skin: Negative for rash.  Neurological: Negative for dizziness, tingling, sensory change, weakness and headaches.  Endo/Heme/Allergies: Negative for polydipsia.  Psychiatric/Behavioral: Negative.   All other systems reviewed and are negative.   Physical Exam: Estimated body mass index is 31.28 kg/m as calculated from the following:   Height as of 04/19/20: '5\' 10"'$  (1.778 m).   Weight as of 04/19/20: 218 lb (98.9 kg). There were no vitals taken for this visit. General Appearance: Well nourished, in no apparent distress.  Eyes: PERRLA, EOMs, conjunctiva no swelling or erythema Sinuses: No Frontal/maxillary tenderness  ENT/Mouth: Ext aud canals clear, normal light reflex with TMs without erythema, bulging.  No erythema, swelling, or exudate on post pharynx. Tonsils not swollen or erythematous. Hearing normal.  Neck: Supple, thyroid subtly enlarged, no nodules. No bruits *** Respiratory: Respiratory effort normal, BS equal bilaterally without rales, rhonchi, wheezing or stridor.  Cardio: RRR with holosystolic murmur LSB, without rubs or gallops. Brisk peripheral pulses with 2+ edema.  Abdomen: Soft, nontender, no guarding, rebound, hernias, masses, or organomegaly.  Lymphatics: Non tender without lymphadenopathy.  Musculoskeletal: Full ROM all peripheral extremities,5/5 strength, and antalgic gait.  Skin: Warm, dry without rashes, lesions, ecchymosis. Neuro: Cranial nerves intact, reflexes equal bilaterally. Normal muscle tone, no cerebellar symptoms.  Sensation intact.  Psych: Awake and oriented X 3, normal affect, Insight and Judgment appropriate.    Gorden Harms Neziah Braley 1:10 PM [facility]

## 2020-05-05 ENCOUNTER — Other Ambulatory Visit: Payer: Self-pay

## 2020-05-05 ENCOUNTER — Ambulatory Visit (INDEPENDENT_AMBULATORY_CARE_PROVIDER_SITE_OTHER): Payer: BC Managed Care – PPO | Admitting: Neurology

## 2020-05-05 ENCOUNTER — Ambulatory Visit: Payer: BC Managed Care – PPO | Admitting: Adult Health

## 2020-05-05 DIAGNOSIS — I10 Essential (primary) hypertension: Secondary | ICD-10-CM

## 2020-05-05 DIAGNOSIS — G40909 Epilepsy, unspecified, not intractable, without status epilepticus: Secondary | ICD-10-CM

## 2020-05-05 DIAGNOSIS — I609 Nontraumatic subarachnoid hemorrhage, unspecified: Secondary | ICD-10-CM

## 2020-05-05 DIAGNOSIS — G4733 Obstructive sleep apnea (adult) (pediatric): Secondary | ICD-10-CM

## 2020-05-05 DIAGNOSIS — I671 Cerebral aneurysm, nonruptured: Secondary | ICD-10-CM

## 2020-05-05 DIAGNOSIS — I5032 Chronic diastolic (congestive) heart failure: Secondary | ICD-10-CM

## 2020-05-05 DIAGNOSIS — E785 Hyperlipidemia, unspecified: Secondary | ICD-10-CM

## 2020-05-05 DIAGNOSIS — R351 Nocturia: Secondary | ICD-10-CM

## 2020-05-05 DIAGNOSIS — R7309 Other abnormal glucose: Secondary | ICD-10-CM

## 2020-05-05 DIAGNOSIS — Z79899 Other long term (current) drug therapy: Secondary | ICD-10-CM

## 2020-05-05 DIAGNOSIS — E559 Vitamin D deficiency, unspecified: Secondary | ICD-10-CM

## 2020-05-05 DIAGNOSIS — G4719 Other hypersomnia: Secondary | ICD-10-CM

## 2020-05-05 DIAGNOSIS — N1832 Chronic kidney disease, stage 3b: Secondary | ICD-10-CM

## 2020-05-06 ENCOUNTER — Other Ambulatory Visit: Payer: Self-pay | Admitting: Adult Health

## 2020-05-06 DIAGNOSIS — M545 Low back pain, unspecified: Secondary | ICD-10-CM

## 2020-05-07 ENCOUNTER — Other Ambulatory Visit: Payer: Self-pay | Admitting: Adult Health

## 2020-05-07 DIAGNOSIS — Z1231 Encounter for screening mammogram for malignant neoplasm of breast: Secondary | ICD-10-CM | POA: Diagnosis not present

## 2020-05-07 LAB — HM MAMMOGRAPHY

## 2020-05-11 ENCOUNTER — Encounter: Payer: Self-pay | Admitting: Adult Health

## 2020-05-13 DIAGNOSIS — Z20828 Contact with and (suspected) exposure to other viral communicable diseases: Secondary | ICD-10-CM | POA: Diagnosis not present

## 2020-05-13 DIAGNOSIS — Z1159 Encounter for screening for other viral diseases: Secondary | ICD-10-CM | POA: Diagnosis not present

## 2020-05-15 ENCOUNTER — Other Ambulatory Visit: Payer: Self-pay | Admitting: Internal Medicine

## 2020-05-15 MED ORDER — ESTROGENS CONJUGATED 0.625 MG PO TABS: ORAL_TABLET | ORAL | 1 refills | Status: DC

## 2020-05-16 DIAGNOSIS — G4733 Obstructive sleep apnea (adult) (pediatric): Secondary | ICD-10-CM | POA: Insufficient documentation

## 2020-05-16 NOTE — Addendum Note (Signed)
Addended by: Larey Seat on: 05/16/2020 05:10 PM   Modules accepted: Orders

## 2020-05-16 NOTE — Procedures (Signed)
PATIENT'S NAME:  Yolanda Espinoza, Yolanda Espinoza DOB:      01-11-1956      MR#:    [id number]     DATE OF RECORDING: 05/05/2020 Jerrye Bushy. REFERRING M.D.:  Unk Pinto, MD Study Performed:  Split-Night Titration Study HISTORY:  Yolanda Espinoza has a past medical history of unspecified Anemia, unspecified Arthritis, Chronic diastolic heart failure ([place]), GERD (gastroesophageal reflux disease), Hyperlipidemia, Hypertension (May, 1973), Mitral regurgitation, OSA on CPAP, Peripheral vascular disease ([place]), Seizures ([place]) (01/2017), brain aneurysms times 5 and resulting Stroke (07) = Subarachnoid hemorrhage due to ruptured aneurysm ([place]), and Vitamin D deficiency. He had 3 aneurysms coiled by Dr Estanislado Pandy.    The patient had the first sleep study in the year 2008 in [place], [place], and has used CPAP since. Chief concern according to patient: " my machine is on recall and I used a "so-clean" device- and now I am very sleepy again, as I can't use my CPAP ", and: "I have fallen asleep at work".   The patient endorsed the Epworth Sleepiness Scale at 14 points   The patient's weight 218 pounds with a height of 70 (inches), resulting in a BMI of 31.2 kg/m2. The patient's neck circumference measured 15.5 inches.  CURRENT MEDICATIONS: Zyloprim, Norvasc, Aspirin, Calcium+D, Vitamin D3, Voltaren, Premarin, ferrous sulfate, Pepcid, Allegra, Lasix, Keppra, Multivitamins, Nystatin, Mycostatin, Benicar, Protonix, fish oil, Prednisone, Vitamin B12   PROCEDURE:  This is a multichannel digital polysomnogram utilizing the Somnostar 11.2 system.  Electrodes and sensors were applied and monitored per AASM Specifications.   EEG, EOG, Chin and Limb EMG, were sampled at 200 Hz.  ECG, Snore and Nasal Pressure, Thermal Airflow, Respiratory Effort, CPAP Flow and Pressure, Oximetry was sampled at 50 Hz. Digital video and audio were recorded.      BASELINE STUDY WITHOUT CPAP RESULTS: Lights Out was at 21:13 and Lights On at 04:59.  Total  recording time (TRT) was 89.5, with a total sleep time (TST) of 66 minutes.    The patient's sleep latency was 25 minutes. REM latency was 53.5 minutes. The sleep efficiency was 73.7 %.    SLEEP ARCHITECTURE: WASO (Wake after sleep onset) was 8.5 minutes, Stage N1 was 7 minutes, Stage N2 was 38 minutes, Stage N3 was 0 minutes and Stage R (REM sleep) was 21 minutes.  The percentages were Stage N1 10.6%, Stage N2 57.6%, Stage N3 0% and Stage R (REM sleep) 31.8%.   RESPIRATORY ANALYSIS:  There were a total of 46 respiratory events:  27 obstructive apneas, 3 central apneas and 2 mixed apneas with a total of 32 apneas and an apnea index (AI) of 29.1. There were 14 hypopneas with a hypopnea index of 12.7. The patient also had few additional respiratory event related arousals (RERAs). Snoring was noted.The total APNEA/HYPOPNEA INDEX (AHI) was 41.8 /hour.  19 events occurred in REM sleep and 25 events in NREM. The REM AHI was 54.3/h, /hour versus a non-REM AHI of 36.0 /hour. The patient spent 342.5 minutes sleep time in the supine position 0 minutes in non-supine. The supine AHI was 41.8 /hour.  OXYGEN SATURATION & C02:  The wake baseline 02 saturation was 96%, with the nadir being 72%. Time spent below 89% saturation equaled 12 minutes.  The arousals were noted as: 2 were spontaneous, 0 were associated with PLMs, 11 were associated with respiratory events. The patient had a total of 0 Periodic Limb Movements.   Audio and video analysis did not show any abnormal or unusual  movements, behaviors, phonations or  Snoring was noted. EKG was in keeping with normal sinus rhythm (NSR)  TITRATION STUDY WITH CPAP RESULTS: CPAP was initiated at 5 cmH20 with the use of a nasal pillow medium P10 by [employer]. Following the AASM split night standards the pressure was advanced to a final 16 cmH20 because of hypopneas, apneas, snoring and desaturations.  The nasal pillow had to be replaced with FFM a medium size Vitera, due to  oral venting. At a PAP pressure of 16 cmH20, there was a reduction of the AHI to 0 /hour. This setting encompassed 32 minutes of sleep time, all in supine, and all NREM sleep.  Under the previously explored setting of 12 and 14 cm water, there was still strong evidence of REM sleep dependent apnea, with high AHIs.   Total recording time (TRT) was 377 minutes, with a total sleep time (TST) of 276.5 minutes. The patient's sleep latency was 17 minutes. REM latency was 87 minutes.  The sleep efficiency was 73.3 %.   SLEEP ARCHITECTURE: Wake after sleep was 95 minutes, Stage N1 27 minutes, Stage N2 177.5 minutes, Stage N3 0 minutes and Stage R (REM sleep) 72 minutes. The percentages were: Stage N1 9.8%, Stage N2 64.2%, Stage N3 0% and Stage R (REM sleep) 26.0%.   RESPIRATORY ANALYSIS:  There were a total of 80 respiratory events: 26 obstructive apneas, 12 central apneas and 8 mixed apneas with 34 hypopneas. The total APNEA/HYPOPNEA INDEX (AHI) was 17.4 /hour.  33 events occurred in REM sleep and 47 events in NREM. The REM AHI was 27.5 /hour versus a non-REM AHI of 13.8 /hour. The patient spent 100% of total sleep time in the supine position. The supine AHI was 17.4 /hour, versus a non-supine AHI of 0.0/hour.  OXYGEN SATURATION & C02:  The wake baseline 02 saturation was 98%, with the lowest being 74%. Time spent below 89% saturation equaled 11 minutes, strongly correlating with REM sleep. The arousals were noted as: 26 were spontaneous, 0 were associated with PLMs, 28 were associated with respiratory events.   POLYSOMNOGRAPHY IMPRESSION : Severe, mostly Obstructive Sleep Apnea (OSA) with REM sleep dependent hypoxemia and REM accentuated Apnea. AHI was 41.8/h. REM sleep AHI was 54/h.  1. CPAP at 16 cm water pressure under a FFM, a Vitera in Medium size, corrected most apneas and hypopneas in NREM sleep, but there is a high risk for breakthrough REM sleep apneas.   RECOMMENDATIONS:  The patient may still  experience some break- through REM sleep apnea, but his sleep apnea in supine was markedly reduced during NREM sleep.  This apnea will not likely respond to inspire or dental devices.   I recommend starting with an auto PAP setting at 7 cm water with 2 cm EPR and a maximum pressure of 18 cm water, heated humidification and to consider a chin strap with any mask - the patient ended this study by using a FFM Vitera by [employer], in medium size.    A follow up appointment will be scheduled in the Sleep Clinic at [facility].      I certify that I have reviewed the entire raw data recording prior to the issuance of this report in accordance with the Standards of Accreditation of the American Academy of Sleep Medicine (AASM)  Larey Seat, M.D. Diplomat, Tax adviser of Psychiatry and Neurology  Diplomat, Tax adviser of Sleep Medicine Market researcher, [employer] [facility] at [name]

## 2020-05-16 NOTE — Progress Notes (Signed)
POLYSOMNOGRAPHY IMPRESSION: Severe, mostly Obstructive Sleep Apnea (OSA) with REM sleep dependent hypoxemia and REM accentuated Apnea. AHI was 41.8/h. REM sleep AHI was 54/h.  1. CPAP at 16 cm water pressure under a FFM, a Vitera in Medium size, corrected most apneas and hypopneas in NREM sleep, but there is a high risk for breakthrough REM sleep apneas.   RECOMMENDATIONS:  The patient may still experience some break- through REM sleep apnea, but his sleep apnea in supine was markedly reduced during NREM sleep.  This apnea will not likely respond to inspire or dental devices.   I recommend starting with an auto PAP setting at 7 cm water with 2 cm EPR and a maximum pressure of 18 cm water, heated humidification and to consider a chin strap with any mask - the patient ended this study by using a FFM Vitera by [employer], in medium size.    A follow up appointment will be scheduled in the Sleep Clinic at [facility].

## 2020-05-18 ENCOUNTER — Ambulatory Visit (INDEPENDENT_AMBULATORY_CARE_PROVIDER_SITE_OTHER): Payer: BC Managed Care – PPO | Admitting: Diagnostic Neuroimaging

## 2020-05-18 ENCOUNTER — Telehealth: Payer: Self-pay | Admitting: Neurology

## 2020-05-18 ENCOUNTER — Encounter: Payer: Self-pay | Admitting: Diagnostic Neuroimaging

## 2020-05-18 VITALS — BP 133/81 | HR 81 | Ht 70.0 in | Wt 218.6 lb

## 2020-05-18 DIAGNOSIS — G40909 Epilepsy, unspecified, not intractable, without status epilepticus: Secondary | ICD-10-CM

## 2020-05-18 MED ORDER — LEVETIRACETAM 1000 MG PO TABS: 1000.0000 mg | ORAL_TABLET | Freq: Two times a day (BID) | ORAL | 4 refills | Status: DC

## 2020-05-18 NOTE — Progress Notes (Signed)
[facility]  PATIENT: Yolanda Espinoza DOB: 08-Aug-1955  REFERRING CLINICIAN: Liane Comber, NP  HISTORY FROM: patient and chart review  REASON FOR VISIT: follow up   HISTORICAL  CHIEF COMPLAINT:  Chief Complaint  Patient presents with  . Seizures    Rm 7, FU  "no seizures since Jan 2019 tolerating Keppra well"    HISTORY OF PRESENT ILLNESS:   UPDATE (05/18/20, VRP): Since last visit, doing well. Symptoms are stable. No seizures. No alleviating or aggravating factors. Tolerating LEV. Aneurysms are stable.  PRIOR HPI ([id number]): 65 year old female with history of right ICA aneurysm status post coiling in 2003, presented for endovascular treatment of 2 left ICA paraophthalmic aneurysms in January 2019, here for evaluation of seizure.  Immediately following treatment in February 21, 2017, patient was noted to have seizure-like activity.  She was started on antiseizure medication.  MRI showed a few punctate foci of DWI hyperintensity in the left hemisphere, possibly periprocedural complication.  Since that time no further seizure.  Patient is tolerating medications.  Per Dr. Rosann Auerbach note on 02/21/17 --> "Whilst being repositioned in the bed ,patients RT side of her face was seen to draw up and twitch,with head turning to the right followed by generalized tonic clonic seizure lasting  approx 3 mins followed by post ictal state."     REVIEW OF SYSTEMS: Full 14 system review of systems performed and negative with exception of: sleep apnea  ALLERGIES: Allergies  Allergen Reactions  . Ace Inhibitors Nausea And Vomiting  . Aspirin Nausea And Vomiting    Can take coated Aspirin    HOME MEDICATIONS: Outpatient Medications Prior to Visit  Medication Sig Dispense Refill  . allopurinol (ZYLOPRIM) 100 MG tablet Take     1 tablet    Daily     to Prevent Gout 90 tablet 0  . amLODipine (NORVASC) 10 MG tablet TAKE 1 TABLET BY MOUTH DAILY FOR BLOOD PRESSURE 90 tablet 1  .  aspirin EC 81 MG tablet Take 1 tablet (81 mg total) by mouth daily.    . Calcium Carb-Cholecalciferol (CALCIUM 600 + D PO) Take 1 tablet by mouth daily.    . cholecalciferol (VITAMIN D3) 25 MCG (1000 UT) tablet Take 1,000 Units by mouth daily.    . cyclobenzaprine (FLEXERIL) 10 MG tablet TAKE 1 TABLET BY MOUTH EVERY NIGHT AT BEDTIME AS NEEDED FOR MUSCLE SPASMS 90 tablet 1  . diclofenac (VOLTAREN) 75 MG EC tablet Take 1 tablet    2 x /day    with Food for Pain & Inflammation 180 tablet 0  . diclofenac sodium (VOLTAREN) 1 % GEL Apply 2 g topically 4 (four) times daily. Right wrist and Right elbow 3 Tube 2  . estrogens, conjugated, (PREMARIN) 0.625 MG tablet Take  1 tablet  Daily   for Hormones 90 tablet 1  . famotidine (PEPCID) 20 MG tablet TAKE 1 TABLET BY MOUTH EVERY MORNING 30-60 MINUTES PRIOR TO BREAKFAST FOR REFLUX 90 tablet 0  . ferrous sulfate 325 (65 FE) MG tablet Take 325 mg by mouth daily with breakfast.    . fexofenadine (ALLEGRA ALLERGY) 180 MG tablet Take 1 tablet (180 mg total) by mouth daily. 30 tablet 2  . furosemide (LASIX) 40 MG tablet Take 1 tablet Daily for BP, Fluid Retention & Ankle Swelling 90 tablet 1  . levETIRAcetam (KEPPRA) 1000 MG tablet TAKE ONE (1) TABLET BY MOUTH TWICE DAILY TO PREVENT SEIZURES 180 tablet 1  . Multiple Vitamins-Minerals (MULTIVITAMIN WITH MINERALS)  tablet Take 1 tablet by mouth daily.    Marland Kitchen nystatin (NYSTATIN) powder APPLY TOPICALLY WITH BRUSH TO AREA AFTER A SHOWER TO PREVENT INFECTION 30 g 0  . nystatin cream (MYCOSTATIN) APPLY 1 APPLICATION TOPICALLY TO RASH TWICE DAILY 30 g 1  . olmesartan (BENICAR) 40 MG tablet Take  1 tablet  Daily  for BP 90 tablet 3  . Omega-3 Fatty Acids (FISH OIL) 1000 MG CAPS Take 1,000 mg by mouth daily.    . pantoprazole (PROTONIX) 40 MG tablet TAKE 1 TABLET BY MOUTH DAILY IN THE EVENING TO PREVENT INDIGESTION AND HEARTBURN 90 tablet 3  . predniSONE (STERAPRED UNI-PAK 21 TAB) 10 MG (21) TBPK tablet Take by mouth daily.  Follow directions on Taper pack 21 tablet 0  . ticagrelor (BRILINTA) 90 MG TABS tablet Take 1 tablet 2 x /day to Prevent Blood Clots 180 tablet 1  . vitamin B-12 (CYANOCOBALAMIN) 500 MCG tablet Take 1,000 mcg by mouth daily.     No facility-administered medications prior to visit.    PAST MEDICAL HISTORY: Past Medical History:  Diagnosis Date  . Anemia   . Arthritis   . Chronic diastolic heart failure ([place])   . GERD (gastroesophageal reflux disease)   . Heart murmur   . Hyperlipidemia   . Hypertension may, 1973  . Mitral regurgitation    a. Echo 01/2012 mild LVH, EF 55-65%, Gr 2 DD, mod MR, mild LAE, PASP 36;  b. Echo (02/2013):  EF 55-60%, Gr 1 DD, mild to mod MR, mild LAE (no sig change since prior echo) // c. Echo 10/18: EF 55-60, normal wall motion, grade 2 diastolic dysfunction, trivial AI, mild to moderate MR, mild LAE, trivial PI, PASP 38    . OSA on CPAP    7 yrs  . Peripheral vascular disease ([place])    cerebral aneursym  . Seizures ([place]) 01/2017  . Stroke [facility]) 07   no weakness jan 29th 2019 no defecits  . Subarachnoid hemorrhage due to ruptured aneurysm ([place])    2003 - s/p coiling  . Unspecified vitamin D deficiency     PAST SURGICAL HISTORY: Past Surgical History:  Procedure Laterality Date  . ABDOMINAL HYSTERECTOMY     complete  . ANEURYSM COILING    . COLONOSCOPY WITH PROPOFOL N/A 09/05/2018   Procedure: COLONOSCOPY WITH PROPOFOL;  Surgeon: Juanita Craver, MD;  Location: [facility];  Service: Endoscopy;  Laterality: N/A;  . ESOPHAGOGASTRODUODENOSCOPY (EGD) WITH PROPOFOL N/A 09/05/2018   Procedure: ESOPHAGOGASTRODUODENOSCOPY (EGD) WITH PROPOFOL;  Surgeon: Juanita Craver, MD;  Location: [facility];  Service: Endoscopy;  Laterality: N/A;  . GIVENS CAPSULE STUDY N/A 10/17/2018   Procedure: GIVENS CAPSULE STUDY;  Surgeon: Juanita Craver, MD;  Location: [facility] ENDOSCOPY;  Service: Endoscopy;  Laterality: N/A;  . head surgery     aneurysm ruptured- stroke  . IR 3D INDEPENDENT  [facility]  09/04/2016  . IR ANGIO INTRA EXTRACRAN SEL COM CAROTID INNOMINATE BILAT MOD SED  09/04/2016  . IR ANGIO INTRA EXTRACRAN SEL INTERNAL CAROTID UNI L MOD SED  02/21/2017  . IR ANGIO VERTEBRAL SEL VERTEBRAL BILAT MOD SED  09/04/2016  . IR ANGIOGRAM EXTREMITY LEFT  09/04/2016  . IR ANGIOGRAM FOLLOW UP STUDY  02/21/2017  . IR NEURO EACH ADD'L AFTER BASIC UNI LEFT (MS)  02/21/2017  . IR RADIOLOGIST EVAL & MGMT  10/11/2016  . IR RADIOLOGIST EVAL & MGMT  03/07/2017  . IR TRANSCATH/EMBOLIZ  02/21/2017  . RADIOLOGY WITH ANESTHESIA N/A 04/23/2013   Procedure:  RADIOLOGY WITH ANESTHESIA;  Surgeon: Rob Hickman, MD;  Location: [place];  Service: Radiology;  Laterality: N/A;  . RADIOLOGY WITH ANESTHESIA N/A 10/30/2016   Procedure: EMBOLIZATION;  Surgeon: Luanne Bras, MD;  Location: [place];  Service: Radiology;  Laterality: N/A;  . RADIOLOGY WITH ANESTHESIA N/A 02/21/2017   Procedure: EMBOLIZATION;  Surgeon: Luanne Bras, MD;  Location: [place];  Service: Radiology;  Laterality: N/A;  . TONSILLECTOMY AND ADENOIDECTOMY    . TUBAL LIGATION      FAMILY HISTORY: Family History  Problem Relation Age of Onset  . Hypertension Father   . Kidney disease Father   . Hypertension Sister   . Cancer Sister 66       Colon cancer  . Hypertension Sister   . Hypertension Sister     SOCIAL HISTORY:  Social History   Socioeconomic History  . Marital status: Married    Spouse name: Not on file  . Number of children: 2  . Years of education: 81  . Highest education level: Not on file  Occupational History    Comment: [employer], part time  Tobacco Use  . Smoking status: Former Smoker    Packs/day: 0.25    Years: 3.00    Pack years: 0.75    Types: Cigarettes    Quit date: 01/24/1999    Years since quitting: 21.3  . Smokeless tobacco: Never Used  Vaping Use  . Vaping Use: Never used  Substance and Sexual Activity  . Alcohol use: No  . Drug use: No  . Sexual activity: Not Currently     Partners: Male    Birth control/protection: Post-menopausal  Other Topics Concern  . Not on file  Social History Narrative   Lives with husband   Caffeine- maybe 1/2 soda, 1 1/2 cups coffee   Social Determinants of Health   Financial Resource Strain: Not on file  Food Insecurity: Not on file  Transportation Needs: Not on file  Physical Activity: Not on file  Stress: Not on file  Social Connections: Not on file  Intimate Partner Violence: Not on file     PHYSICAL EXAM  GENERAL EXAM/CONSTITUTIONAL: Vitals:  Vitals:   05/18/20 0837  BP: 133/81  Pulse: 81  Weight: 218 lb 9.6 oz (99.2 kg)  Height: '5\' 10"'$  (1.778 m)   Body mass index is 31.37 kg/m. No exam data present  Patient is in no distress; well developed, nourished and groomed; neck is supple  CARDIOVASCULAR:  Examination of carotid arteries is normal; no carotid bruits  Regular rate and rhythm, no murmurs  Examination of peripheral vascular system by observation and palpation is normal  EYES:  Ophthalmoscopic exam of optic discs and posterior segments is normal; no papilledema or hemorrhages  MUSCULOSKELETAL:  Gait, strength, tone, movements noted in Neurologic exam below  NEUROLOGIC: MENTAL STATUS:  No flowsheet data found.  awake, alert, oriented to person, place and time  recent and remote memory intact  normal attention and concentration  language fluent, comprehension intact, naming intact,   fund of knowledge appropriate  CRANIAL NERVE:   2nd - no papilledema on fundoscopic exam  2nd, 3rd, 4th, 6th - pupils equal and reactive to light, visual fields full to confrontation, extraocular muscles intact, no nystagmus  5th - facial sensation symmetric  7th - facial strength symmetric  8th - hearing intact  9th - palate elevates symmetrically, uvula midline  11th - shoulder shrug symmetric  12th - tongue protrusion midline  MOTOR:   normal  bulk and tone, full strength in the BUE,  BLE  SLIGHT STIFFNESS IN RUE   SENSORY:   normal and symmetric to light touch, temperature, vibration  COORDINATION:   finger-nose-finger, fine finger movements normal  REFLEXES:   deep tendon reflexes TRACE and symmetric  GAIT/STATION:   narrow based gait    DIAGNOSTIC DATA (LABS, IMAGING, TESTING) - I reviewed patient records, labs, notes, testing and imaging myself where available.  Lab Results  Component Value Date   WBC 8.3 03/30/2020   HGB 9.5 (L) 03/30/2020   HCT 30.1 (L) 03/30/2020   MCV 87.0 03/30/2020   PLT 303 03/30/2020      Component Value Date/Time   NA 140 03/30/2020 1434   NA 140 11/08/2016 1149   K 5.1 03/30/2020 1434   CL 108 03/30/2020 1434   CO2 24 03/30/2020 1434   GLUCOSE 73 03/30/2020 1434   BUN 23 03/30/2020 1434   BUN 12 11/08/2016 1149   CREATININE 1.40 (H) 03/30/2020 1434   CALCIUM 9.9 03/30/2020 1434   PROT 7.7 03/30/2020 1434   ALBUMIN 3.8 02/18/2020 0854   AST 17 03/30/2020 1434   AST 14 (L) 02/18/2020 0854   ALT 13 03/30/2020 1434   ALT 7 02/18/2020 0854   ALKPHOS 50 02/18/2020 0854   BILITOT 0.3 03/30/2020 1434   BILITOT 0.2 (L) 02/18/2020 0854   GFRNONAA 40 (L) 03/30/2020 1434   GFRAA 46 (L) 03/30/2020 1434   Lab Results  Component Value Date   CHOL 215 (H) 01/26/2020   HDL 106 01/26/2020   LDLCALC 63 01/26/2020   TRIG 379 (H) 01/26/2020   CHOLHDL 2.0 01/26/2020   Lab Results  Component Value Date   HGBA1C 5.5 01/26/2020   Lab Results  Component Value Date   VITAMINB12 >2,000 (H) 10/27/2019   Lab Results  Component Value Date   TSH 4.78 (H) 01/26/2020   09/04/16 cerebral angiogram - Interval complete obliteration of the right internal carotid artery superior hypophyseal region aneurysm with wide patency of the adjacent flow diverter device. - A 3.3 mm x 3 mm left internal carotid artery superior hypophyseal region aneurysm. - Wide neck neck aneurysmal dilatation of the left internal carotid artery at  the level of the ophthalmic artery measuring 2.6 mm x 2.5 mm.  02/21/17 cerebral angiogram / endovascular treatment - Status post endovascular treatment of irregular lobulated 2 paraophthalmic region aneurysms using the pipeline flow diverter device as described above.  02/21/17 MRI brain [I reviewed images myself and agree with interpretation. -VRP]  1. Punctate acute/early subacute cortical infarct in left parietal lobe and possible additional punctate infarct in left insula. No associated hemorrhage or mass effect. 2. Stable mild chronic microvascular ischemic changes and mild parenchymal volume loss of the brain. 3. No structural cause of seizure identified.  02/22/17 EEG - This is an abnormal EEG due to mild background slowing.  This is a non-specific finding that can be seen with toxic, metabolic, diffuse, or multifocal structural processes.  No definite epileptiform changes were noted.   A single EEG without epileptiform changes does not exclude the diagnosis of epilepsy. Clinical correlation advised.  04/30/20 MRI brain / MRA head  1. No acute intracranial abnormality. Stable white matter changes compatible chronic microvascular ischemia. 2. Stenting of the cavernous carotid bilaterally is patent. Aneurysm coiling left supraclinoid internal carotid artery. 2 mm area of flow related signal at the base of the aneurysm could represent residual flow. This is unchanged from the prior study.  05/05/20 POLYSOMNOGRAPHY IMPRESSION: Severe, mostly Obstructive Sleep Apnea (OSA) with REM sleep dependent hypoxemia and REM accentuated Apnea. AHI was 41.8/h. REM sleep AHI was 54/h.  1.         CPAP at 16 cm water pressure under a FFM, a Vitera in Medium size, corrected most apneas and hypopneas in NREM sleep, but there is a high risk for breakthrough REM sleep apneas.      ASSESSMENT AND PLAN  65 y.o. year old female here with history of right ICA aneurysm status post coiling in 2003, status  post left ICA aneurysm x2 endovascular treatment in January 2019, with postprocedure seizure.  Now patient on antiseizure medication and doing well.    Dx:  1. Seizure disorder [facility])      PLAN:  POST-PROCEDURE SEIZURE (Jan 2019; no recurrence) - continue levetiracetam '1000mg'$  twice a day; plan for long term treatment; will see if PCP can refill levetiracetam going forward  SLEEP APNEA - per Dr. Dyanne Iha; plan for CPAP treatment  CEREBRAL ANEURYSMS - stable; per Dr. Estanislado Pandy  Meds ordered this encounter  Medications  . levETIRAcetam (KEPPRA) 1000 MG tablet    Sig: Take 1 tablet (1,000 mg total) by mouth 2 (two) times daily.    Dispense:  180 tablet    Refill:  4   Return for return to PCP.    Penni Bombard, MD [id number], [id number] AM Certified in Neurology, Neurophysiology and Neuroimaging  [facility] [address] [place], [place] [address] [phone]

## 2020-05-18 NOTE — Telephone Encounter (Signed)
Patient was in the office for a visit with Dr. Leta Baptist.  Was able to go in the room and discussed the sleep study results with the patient. I advised pt that Dr. Brett Fairy reviewed their sleep study results and found that pt moderate to severe sleep apnea. Dr. Brett Fairy recommends that pt starts auto CPAP. I reviewed PAP compliance expectations with the pt. Pt is agreeable to starting a CPAP. I advised pt that an order will be sent to a DME, [facility] and [facility] will call the pt within about one week after they file with the pt's insurance. [facility]) will show the pt how to use the machine, fit for masks, and troubleshoot the CPAP if needed. A follow up appt will need to be made for insurance purposes with Dr. Brett Fairy or Amy NP 31-90 days from the date that she picks up the machine. Pt will call and schedule this apt once she has an appointment to pick up the machine. A letter with all of this information in it will be mailed to the pt as a reminder. I verified with the pt that the address we have on file is correct. Pt verbalized understanding of results. Pt had no questions at this time but was encouraged to call back if questions arise. I have sent the order to [facility])  and have received confirmation that they have received the order.

## 2020-05-18 NOTE — Telephone Encounter (Signed)
Called patient to discuss sleep study results. No answer at this time. LVM for the patient to call back.   

## 2020-05-18 NOTE — Telephone Encounter (Signed)
-----   Message from Larey Seat, MD sent at 05/16/2020  5:10 PM EDT ----- POLYSOMNOGRAPHY IMPRESSION: Severe, mostly Obstructive Sleep Apnea (OSA) with REM sleep dependent hypoxemia and REM accentuated Apnea. AHI was 41.8/h. REM sleep AHI was 54/h.  1. CPAP at 16 cm water pressure under a FFM, a Vitera in Medium size, corrected most apneas and hypopneas in NREM sleep, but there is a high risk for breakthrough REM sleep apneas.   RECOMMENDATIONS:  The patient may still experience some break- through REM sleep apnea, but his sleep apnea in supine was markedly reduced during NREM sleep.  This apnea will not likely respond to inspire or dental devices.   I recommend starting with an auto PAP setting at 7 cm water with 2 cm EPR and a maximum pressure of 18 cm water, heated humidification and to consider a chin strap with any mask - the patient ended this study by using a FFM Vitera by [employer], in medium size.    A follow up appointment will be scheduled in the Sleep Clinic at [facility].

## 2020-05-18 NOTE — Patient Instructions (Signed)
  POST-PROCEDURE SEIZURE (Jan 2019; no recurrence) - continue levetiracetam '1000mg'$  twice a day; plan for long term treatment; will see if PCP can refill levetiracetam going forward  SLEEP APNEA - per Dr. Dyanne Iha; plan for CPAP treatment  CEREBRAL ANEURYSMS - stable; per Dr. Estanislado Pandy

## 2020-05-20 DIAGNOSIS — Z1159 Encounter for screening for other viral diseases: Secondary | ICD-10-CM | POA: Diagnosis not present

## 2020-05-20 DIAGNOSIS — Z20828 Contact with and (suspected) exposure to other viral communicable diseases: Secondary | ICD-10-CM | POA: Diagnosis not present

## 2020-06-01 ENCOUNTER — Other Ambulatory Visit: Payer: Self-pay | Admitting: Adult Health

## 2020-06-01 ENCOUNTER — Other Ambulatory Visit: Payer: Self-pay | Admitting: Internal Medicine

## 2020-06-01 DIAGNOSIS — B372 Candidiasis of skin and nail: Secondary | ICD-10-CM

## 2020-06-01 DIAGNOSIS — R1032 Left lower quadrant pain: Secondary | ICD-10-CM

## 2020-06-07 DIAGNOSIS — Z20828 Contact with and (suspected) exposure to other viral communicable diseases: Secondary | ICD-10-CM | POA: Diagnosis not present

## 2020-06-07 DIAGNOSIS — Z1159 Encounter for screening for other viral diseases: Secondary | ICD-10-CM | POA: Diagnosis not present

## 2020-06-14 DIAGNOSIS — Z20828 Contact with and (suspected) exposure to other viral communicable diseases: Secondary | ICD-10-CM | POA: Diagnosis not present

## 2020-06-14 DIAGNOSIS — Z1159 Encounter for screening for other viral diseases: Secondary | ICD-10-CM | POA: Diagnosis not present

## 2020-06-16 ENCOUNTER — Encounter: Payer: Self-pay | Admitting: Adult Health

## 2020-06-16 NOTE — Progress Notes (Signed)
3 MONTH FOLLOW UP  Assessment and Plan:   Chronic diastolic heart failure Emphasized need for weight loss  Emphasized salt restriction, less than '2000mg'$  a day. Encouraged daily monitoring of the patient's weight, call office if 3 lb weight loss or gain in a day.  Encouraged regular exercise.  decrease your fluid intake to less than 2 L daily  Cardiology is managing; appears euvolemic today   Essential hypertension - continue medications, DASH diet, exercise and monitor at home. Call if greater than 130/80.  - CBC with Differential/Platelet - CMP/GFR - Magnesium   Abnormal glucose Discussed general issues about diabetes pathophysiology and management., Educational material distributed., Suggested low cholesterol diet., Encouraged aerobic exercise., Discussed foot care., Reminded to get yearly retinal exam. - Hemoglobin A1c  Hyperlipidemia -continue medications, check lipids, decrease fatty foods, increase activity. - Lipid panel  Vitamin D deficiency Continue supplement  - Vit D  25 hydroxy (rtn osteoporosis monitoring) - defer  Medication management - Magnesium  OSA on CPAP Dr. Brett Fairy following,recommended CPAP    Brain aneurysm Dr. Estanislado Pandy follows  S/p coil, on brilinta Control blood pressure, cholesterol, glucose, increase exercise.    Seizures (HCC) R/t CVA/aneurysm; Dr. Estanislado Pandy following, on keppra without recent dose change, he checks levels annually  Gout of foot, unspecified cause, unspecified chronicity, unspecified laterality - Gout- recheck Uric acid as needed, Diet discussed, continue medications.  Gastroesophageal reflux disease, esophagitis presence not specified Well managed on current medications Discussed diet, avoiding triggers and other lifestyle changes  Morbid obesity ([place]) - BMI 30+ with OSA - long discussion about weight loss, diet, and exercise -recommended diet heavy in fruits and veggies and low in animal meats, cheeses, and dairy  products  Persistent cough Cough- multifactorial-   Continue PPI, raise HOB on blocks, GERD diet, no improvement with antihistamine and adding AM dosing; Recommended ENT evaluation for laryngoscopy - declines today, plans to discuss with Dr. Quincy Simmonds Also possible mild dysphagia leading to daytime cough with drinking/eating - consider speech pathology referral if cough not improving  Discussed med's effects and SE's. Labs and tests as requested with regular follow-up as recommended. Over 30 minutes of exam, counseling, chart review, and complex, high level critical decision making was performed this visit.  Future Appointments  Date Time Provider [place]  06/17/2020  9:00 AM Liane Comber, NP [facility] None  08/04/2020 11:30 AM Liane Comber, NP [facility] None  01/26/2021 10:00 AM Liane Comber, NP [facility] None    HPI  65 y.o. female  presents for 3 month follow up htn, CHF, hld, morbid obesity, vit D def.   She was admitted 01/2017 for left ICA aneursym for a pipeline stent but had a seizure and AMS after her procedure, she is on brilinta, following with Dr. Corena Pilgrim, on keppra 1000 mg BID (he checks levels annually). Mas mild residual R arm weakness and some spasticity. Had follow up MRI 04/30/2020 which showed essentially unchanged, stable chronic microvascular ischemia.  She has been on CPAP since 2013, had at [facility], was doing well but recently CPAP machine was recalled, needing replacement. She was referred to Dr. Brett Fairy, recent sleep study 04/2020 showed severe OSA with REM hypoxia, AHI was 41.8/h, recommended CPAP at 16 cm. Lost phone number to coordinate CPAP pickup - Dr. Brett Fairy contact information given  She has reported persistent dry cough, triggered by inhalation, drinking water or any food intake, but notably worse at night. She reports coughing with drinking or food is ongoing since her aneurysm/stroke/seizure, but  has noted worse coughing at night ?  Became worse after she stopped using CPAP machine. She also admits to allergy sx, she is on daily antihistamine without benefit, denies post-nasal drip. She also has GERD, taking pantoprazole 40 mg at night without benefit. Discussed ENT for laryngoscopy but declines at this time. She plans to discuss with Dr. Collene Mares.   and she is on estrogen pills has been taking premarin 0.9 mg for over 10 years, recently reduce to 0.625 mg. Reports doing well with this dose.   BMI is Body mass index is 30.71 kg/m., she has been working on diet and exercise. Wt Readings from Last 3 Encounters:  06/17/20 214 lb (97.1 kg)  05/18/20 218 lb 9.6 oz (99.2 kg)  04/19/20 218 lb (98.9 kg)   PVD and CHF MR, follows with Dr. Sondra Come.  Had ECHO 07/2019 showing normal LV function, Mod MR, mass in the RA, but follow up cardiac MRI on 10/02/2019 showed no evidence of atrial mass.   Her blood pressure has been controlled at home, states has been good at home, today their BP is BP: 134/76 She does workout, walks daily for 30 mins. She denies chest pain, shortness of breath, dizziness.   She is not on cholesterol medication and denies myalgias. Her cholesterol is at goal. The cholesterol last visit was:   Lab Results  Component Value Date   CHOL 215 (H) 01/26/2020   HDL 106 01/26/2020   LDLCALC 63 01/26/2020   TRIG 379 (H) 01/26/2020   CHOLHDL 2.0 01/26/2020   She has been working on diet and exercise for glucose management, and denies paresthesia of the feet, polydipsia, polyuria and visual disturbances. Last A1C in the office was:  Lab Results  Component Value Date   HGBA1C 5.5 01/26/2020    Patient is on allopurinol for gout and does not report a recent flare.  Lab Results  Component Value Date   LABURIC 6.4 01/26/2020   Patient is on Vitamin D supplement, taking 2 caps of unsure dose ? 5000 IU Lab Results  Component Value Date   VD25OH 79 (L) 01/26/2020      She has persistent anemia, was recently referred to  evaluation by hematology with unremarkable workup, biopsy/aspiration were nonspecific and felt likely anemia of chronic disease secondary renal. Some iron def as well and on ferrous sulphate. Dr. Earlie Server follows.  CBC Latest Ref Rng & Units 03/30/2020 03/21/2020 02/18/2020  WBC 3.8 - 10.8 Thousand/uL 8.3 7.6 7.0  Hemoglobin 11.7 - 15.5 g/dL 9.5(L) 9.5(L) 9.2(L)  Hematocrit 35.0 - 45.0 % 30.1(L) 29.7(L) 29.5(L)  Platelets 140 - 400 Thousand/uL 303 305 282     Current Medications:   Current Outpatient Medications (Endocrine & Metabolic):  .  estrogens, conjugated, (PREMARIN) 0.625 MG tablet, Take  1 tablet  Daily   for Hormones .  predniSONE (STERAPRED UNI-PAK 21 TAB) 10 MG (21) TBPK tablet, Take by mouth daily. Follow directions on Taper pack  Current Outpatient Medications (Cardiovascular):  .  amLODipine (NORVASC) 10 MG tablet, TAKE 1 TABLET BY MOUTH DAILY FOR BLOOD PRESSURE .  furosemide (LASIX) 40 MG tablet, Take 1 tablet Daily for BP, Fluid Retention & Ankle Swelling .  olmesartan (BENICAR) 40 MG tablet, Take  1 tablet  Daily  for BP  Current Outpatient Medications (Respiratory):  .  fexofenadine (ALLEGRA ALLERGY) 180 MG tablet, Take 1 tablet (180 mg total) by mouth daily.  Current Outpatient Medications (Analgesics):  .  allopurinol (ZYLOPRIM) 100 MG tablet, Take  1 tablet  Daily  to Prevent Gout .  aspirin EC 81 MG tablet, Take 1 tablet (81 mg total) by mouth daily. .  diclofenac (VOLTAREN) 75 MG EC tablet, Take 1 tablet   2 x /day  with food for Pain & Inflammation  Current Outpatient Medications (Hematological):  .  ferrous sulfate 325 (65 FE) MG tablet, Take 325 mg by mouth daily with breakfast. .  ticagrelor (BRILINTA) 90 MG TABS tablet, Take 1 tablet 2 x /day to Prevent Blood Clots .  vitamin B-12 (CYANOCOBALAMIN) 500 MCG tablet, Take 1,000 mcg by mouth daily.  Current Outpatient Medications (Other):  Marland Kitchen  Calcium Carb-Cholecalciferol (CALCIUM 600 + D PO), Take 1 tablet by  mouth daily. .  cholecalciferol (VITAMIN D3) 25 MCG (1000 UT) tablet, Take 1,000 Units by mouth daily. .  cyclobenzaprine (FLEXERIL) 10 MG tablet, TAKE 1 TABLET BY MOUTH EVERY NIGHT AT BEDTIME AS NEEDED FOR MUSCLE SPASMS .  diclofenac sodium (VOLTAREN) 1 % GEL, Apply 2 g topically 4 (four) times daily. Right wrist and Right elbow .  famotidine (PEPCID) 20 MG tablet, TAKE 1 TABLET BY MOUTH EVERY MORNING 30-60 MINUTES PRIOR TO BREAKFAST FOR REFLUX .  levETIRAcetam (KEPPRA) 1000 MG tablet, Take 1 tablet (1,000 mg total) by mouth 2 (two) times daily. .  Multiple Vitamins-Minerals (MULTIVITAMIN WITH MINERALS) tablet, Take 1 tablet by mouth daily. .  Omega-3 Fatty Acids (FISH OIL) 1000 MG CAPS, Take 1,000 mg by mouth daily. .  pantoprazole (PROTONIX) 40 MG tablet, TAKE 1 TABLET BY MOUTH DAILY IN THE EVENING TO PREVENT INDIGESTION AND HEARTBURN .  nystatin (NYSTATIN) powder, APPLY TOPICALLY WITH BRUSH TO AREA AFTER A SHOWER TO PREVENT INFECTION .  nystatin cream (MYCOSTATIN), APPLY 1 APPLICATION TOPICALLY TO RASH TWICE DAILY  Medical History:  Past Medical History:  Diagnosis Date  . Anemia   . Arthritis   . Chronic diastolic heart failure ([place])   . GERD (gastroesophageal reflux disease)   . Heart murmur   . Hyperlipidemia   . Hypertension may, 1973  . Mitral regurgitation    a. Echo 01/2012 mild LVH, EF 55-65%, Gr 2 DD, mod MR, mild LAE, PASP 36;  b. Echo (02/2013):  EF 55-60%, Gr 1 DD, mild to mod MR, mild LAE (no sig change since prior echo) // c. Echo 10/18: EF 55-60, normal wall motion, grade 2 diastolic dysfunction, trivial AI, mild to moderate MR, mild LAE, trivial PI, PASP 38    . OSA on CPAP    7 yrs  . Seizures ([place]) 01/2017  . Stroke [facility]) 07   no weakness jan 29th 2019 no defecits  . Subarachnoid hemorrhage due to ruptured aneurysm ([place])    2003 - s/p coiling  . Unspecified vitamin D deficiency    Allergies Allergies  Allergen Reactions  . Ace Inhibitors Nausea And Vomiting  .  Aspirin Nausea And Vomiting    Can take coated Aspirin   SURGICAL HISTORY She  has a past surgical history that includes Aneurysm coiling; head surgery; Tonsillectomy and adenoidectomy; Radiology with anesthesia (N/A, 04/23/2013); IR 3D Independent Wkst (09/04/2016); IR Angiogram Extremity Left (09/04/2016); IR ANGIO INTRA EXTRACRAN SEL COM CAROTID INNOMINATE BILAT MOD SED (09/04/2016); IR ANGIO VERTEBRAL SEL VERTEBRAL BILAT MOD SED (09/04/2016); IR Radiologist Eval & Mgmt (10/11/2016); Radiology with anesthesia (N/A, 10/30/2016); Tubal ligation; Radiology with anesthesia (N/A, 02/21/2017); IR Transcath/Emboliz (02/21/2017); IR ANGIO INTRA EXTRACRAN SEL INTERNAL CAROTID UNI L MOD SED (02/21/2017); IR NEURO EACH ADD'L AFTER BASIC UNI LEFT (MS) (  02/21/2017); IR Angiogram Follow Up Study (02/21/2017); IR Radiologist Eval & Mgmt (03/07/2017); Abdominal hysterectomy; Esophagogastroduodenoscopy (egd) with propofol (N/A, 09/05/2018); Colonoscopy with propofol (N/A, 09/05/2018); and Givens capsule study (N/A, 10/17/2018).   FAMILY HISTORY Her family history includes Cancer (age of onset: 29) in her sister; Hypertension in her father, sister, sister, and sister; Kidney disease in her father.   SOCIAL HISTORY She  reports that she quit smoking about 21 years ago. Her smoking use included cigarettes. She has a 0.75 pack-year smoking history. She has never used smokeless tobacco. She reports that she does not drink alcohol and does not use drugs.  Review of Systems: Review of Systems  Constitutional: Negative for malaise/fatigue and weight loss.  HENT: Negative for hearing loss and tinnitus.   Eyes: Negative for blurred vision and double vision.  Respiratory: Positive for cough (dry, worse at night). Negative for sputum production, shortness of breath and wheezing.   Cardiovascular: Negative for chest pain, palpitations, orthopnea, claudication and leg swelling.  Gastrointestinal: Negative for abdominal pain, blood in stool,  constipation, diarrhea, heartburn, melena, nausea and vomiting.  Genitourinary: Negative.   Musculoskeletal: Negative for joint pain and myalgias.  Skin: Negative for rash.  Neurological: Negative for dizziness, tingling, sensory change, weakness and headaches.  Endo/Heme/Allergies: Negative for polydipsia.  Psychiatric/Behavioral: Negative.   All other systems reviewed and are negative.   Physical Exam: Estimated body mass index is 30.71 kg/m as calculated from the following:   Height as of 05/18/20: '5\' 10"'$  (1.778 m).   Weight as of this encounter: 214 lb (97.1 kg). BP 134/76   Pulse 73   Temp (!) 96.8 F (36 C)   Wt 214 lb (97.1 kg)   SpO2 99%   BMI 30.71 kg/m  General Appearance: Well nourished, in no apparent distress.  Eyes: PERRLA, EOMs, conjunctiva no swelling or erythema Sinuses: No Frontal/maxillary tenderness  ENT/Mouth: Ext aud canals clear, normal light reflex with TMs without erythema, bulging.  No erythema, swelling, or exudate on post pharynx. Tonsils not swollen or erythematous. Hearing normal.  Neck: Supple, thyroid subtly enlarged, no nodules. No bruits  Respiratory: Respiratory effort normal, BS equal bilaterally without rales, rhonchi, wheezing or stridor.  Cardio: RRR without murmur, without rubs or gallops. Brisk peripheral pulses with 2+ edema.  Chest: symmetric, with normal excursions and percussion.  Breasts: breasts appear normal, no suspicious masses, no skin or nipple changes or axillary nodes. Abdomen: Soft, nontender, no guarding, rebound, hernias, masses, or organomegaly.  Lymphatics: Non tender without lymphadenopathy.  Genitourinary: defer Musculoskeletal: Full ROM all peripheral extremities,5/5 strength, and antalgic gait.  Skin: Warm, dry without rashes, lesions, ecchymosis. Neuro: Cranial nerves intact, reflexes equal bilaterally. Normal muscle tone, no cerebellar symptoms. Sensation intact.  Psych: Awake and oriented X 3, normal affect,  Insight and Judgment appropriate.   Izora Ribas 8:49 AM [facility]

## 2020-06-17 ENCOUNTER — Encounter: Payer: Self-pay | Admitting: Adult Health

## 2020-06-17 ENCOUNTER — Other Ambulatory Visit: Payer: Self-pay

## 2020-06-17 ENCOUNTER — Ambulatory Visit (INDEPENDENT_AMBULATORY_CARE_PROVIDER_SITE_OTHER): Payer: BC Managed Care – PPO | Admitting: Adult Health

## 2020-06-17 VITALS — BP 134/76 | HR 73 | Temp 96.8°F | Wt 214.0 lb

## 2020-06-17 DIAGNOSIS — E785 Hyperlipidemia, unspecified: Secondary | ICD-10-CM | POA: Diagnosis not present

## 2020-06-17 DIAGNOSIS — R7309 Other abnormal glucose: Secondary | ICD-10-CM

## 2020-06-17 DIAGNOSIS — I609 Nontraumatic subarachnoid hemorrhage, unspecified: Secondary | ICD-10-CM | POA: Diagnosis not present

## 2020-06-17 DIAGNOSIS — G4733 Obstructive sleep apnea (adult) (pediatric): Secondary | ICD-10-CM | POA: Diagnosis not present

## 2020-06-17 DIAGNOSIS — Z9989 Dependence on other enabling machines and devices: Secondary | ICD-10-CM

## 2020-06-17 DIAGNOSIS — I5032 Chronic diastolic (congestive) heart failure: Secondary | ICD-10-CM

## 2020-06-17 DIAGNOSIS — Z79899 Other long term (current) drug therapy: Secondary | ICD-10-CM

## 2020-06-17 DIAGNOSIS — I10 Essential (primary) hypertension: Secondary | ICD-10-CM | POA: Diagnosis not present

## 2020-06-17 DIAGNOSIS — M109 Gout, unspecified: Secondary | ICD-10-CM | POA: Diagnosis not present

## 2020-06-17 DIAGNOSIS — G40909 Epilepsy, unspecified, not intractable, without status epilepticus: Secondary | ICD-10-CM

## 2020-06-17 DIAGNOSIS — E559 Vitamin D deficiency, unspecified: Secondary | ICD-10-CM | POA: Diagnosis not present

## 2020-06-17 DIAGNOSIS — R053 Chronic cough: Secondary | ICD-10-CM

## 2020-06-17 DIAGNOSIS — N1832 Chronic kidney disease, stage 3b: Secondary | ICD-10-CM

## 2020-06-17 NOTE — Patient Instructions (Signed)
     Team Member Role and Air traffic controller Info Address Start End Comments  Dohmeier, Asencion Partridge, MD Consulting Physician (Neurology) Phone: [phone] Fax: [phone] Email: [email or web address]  [address] [place] [address] 06/17/2020 - -

## 2020-06-18 LAB — COMPLETE METABOLIC PANEL WITH GFR
AG Ratio: 1.4 (calc) (ref 1.0–2.5)
ALT: 10 U/L (ref 6–29)
AST: 18 U/L (ref 10–35)
Albumin: 4.3 g/dL (ref 3.6–5.1)
Alkaline phosphatase (APISO): 61 U/L (ref 37–153)
BUN/Creatinine Ratio: 13 (calc) (ref 6–22)
BUN: 15 mg/dL (ref 7–25)
CO2: 25 mmol/L (ref 20–32)
Calcium: 9.6 mg/dL (ref 8.6–10.4)
Chloride: 107 mmol/L (ref 98–110)
Creat: 1.17 mg/dL — ABNORMAL HIGH (ref 0.50–0.99)
GFR, Est African American: 57 mL/min/{1.73_m2} — ABNORMAL LOW (ref >=60)
GFR, Est Non African American: 49 mL/min/{1.73_m2} — ABNORMAL LOW (ref >=60)
Globulin: 3 g/dL (calc) (ref 1.9–3.7)
Glucose, Bld: 74 mg/dL (ref 65–99)
Potassium: 5 mmol/L (ref 3.5–5.3)
Sodium: 139 mmol/L (ref 135–146)
Total Bilirubin: 0.2 mg/dL (ref 0.2–1.2)
Total Protein: 7.3 g/dL (ref 6.1–8.1)

## 2020-06-18 LAB — LIPID PANEL
Cholesterol: 213 mg/dL — ABNORMAL HIGH (ref <200)
HDL: 107 mg/dL (ref >=50)
LDL Cholesterol (Calc): 85 mg/dL (calc)
Non-HDL Cholesterol (Calc): 106 mg/dL (calc) (ref <130)
Total CHOL/HDL Ratio: 2 (calc) (ref <5.0)
Triglycerides: 112 mg/dL (ref <150)

## 2020-06-18 LAB — CBC WITH DIFFERENTIAL/PLATELET
Absolute Monocytes: 381 cells/uL (ref 200–950)
Basophils Absolute: 61 cells/uL (ref 0–200)
Basophils Relative: 0.9 %
Eosinophils Absolute: 435 cells/uL (ref 15–500)
Eosinophils Relative: 6.4 %
HCT: 31.1 % — ABNORMAL LOW (ref 35.0–45.0)
Hemoglobin: 9.6 g/dL — ABNORMAL LOW (ref 11.7–15.5)
Lymphs Abs: 2360 cells/uL (ref 850–3900)
MCH: 27.2 pg (ref 27.0–33.0)
MCHC: 30.9 g/dL — ABNORMAL LOW (ref 32.0–36.0)
MCV: 88.1 fL (ref 80.0–100.0)
MPV: 10.1 fL (ref 7.5–12.5)
Monocytes Relative: 5.6 %
Neutro Abs: 3563 cells/uL (ref 1500–7800)
Neutrophils Relative %: 52.4 %
Platelets: 317 10*3/uL (ref 140–400)
RBC: 3.53 10*6/uL — ABNORMAL LOW (ref 3.80–5.10)
RDW: 15.1 % — ABNORMAL HIGH (ref 11.0–15.0)
Total Lymphocyte: 34.7 %
WBC: 6.8 10*3/uL (ref 3.8–10.8)

## 2020-06-18 LAB — MAGNESIUM: Magnesium: 1.7 mg/dL (ref 1.5–2.5)

## 2020-06-18 LAB — URIC ACID: Uric Acid, Serum: 4.9 mg/dL (ref 2.5–7.0)

## 2020-06-18 LAB — VITAMIN D 25 HYDROXY (VIT D DEFICIENCY, FRACTURES): Vit D, 25-Hydroxy: 34 ng/mL (ref 30–100)

## 2020-06-18 LAB — TSH: TSH: 4.15 mIU/L (ref 0.40–4.50)

## 2020-06-21 DIAGNOSIS — Z1159 Encounter for screening for other viral diseases: Secondary | ICD-10-CM | POA: Diagnosis not present

## 2020-06-21 DIAGNOSIS — Z20828 Contact with and (suspected) exposure to other viral communicable diseases: Secondary | ICD-10-CM | POA: Diagnosis not present

## 2020-06-28 DIAGNOSIS — Z1159 Encounter for screening for other viral diseases: Secondary | ICD-10-CM | POA: Diagnosis not present

## 2020-06-28 DIAGNOSIS — Z20828 Contact with and (suspected) exposure to other viral communicable diseases: Secondary | ICD-10-CM | POA: Diagnosis not present

## 2020-06-29 ENCOUNTER — Other Ambulatory Visit: Payer: Self-pay | Admitting: Adult Health

## 2020-07-08 DIAGNOSIS — Z20828 Contact with and (suspected) exposure to other viral communicable diseases: Secondary | ICD-10-CM | POA: Diagnosis not present

## 2020-07-08 DIAGNOSIS — Z1159 Encounter for screening for other viral diseases: Secondary | ICD-10-CM | POA: Diagnosis not present

## 2020-07-12 DIAGNOSIS — Z20828 Contact with and (suspected) exposure to other viral communicable diseases: Secondary | ICD-10-CM | POA: Diagnosis not present

## 2020-07-12 DIAGNOSIS — Z1159 Encounter for screening for other viral diseases: Secondary | ICD-10-CM | POA: Diagnosis not present

## 2020-07-15 DIAGNOSIS — Z20828 Contact with and (suspected) exposure to other viral communicable diseases: Secondary | ICD-10-CM | POA: Diagnosis not present

## 2020-07-15 DIAGNOSIS — Z1159 Encounter for screening for other viral diseases: Secondary | ICD-10-CM | POA: Diagnosis not present

## 2020-07-19 DIAGNOSIS — Z1159 Encounter for screening for other viral diseases: Secondary | ICD-10-CM | POA: Diagnosis not present

## 2020-07-19 DIAGNOSIS — Z20828 Contact with and (suspected) exposure to other viral communicable diseases: Secondary | ICD-10-CM | POA: Diagnosis not present

## 2020-07-22 DIAGNOSIS — Z1159 Encounter for screening for other viral diseases: Secondary | ICD-10-CM | POA: Diagnosis not present

## 2020-07-22 DIAGNOSIS — Z20828 Contact with and (suspected) exposure to other viral communicable diseases: Secondary | ICD-10-CM | POA: Diagnosis not present

## 2020-07-26 DIAGNOSIS — Z20828 Contact with and (suspected) exposure to other viral communicable diseases: Secondary | ICD-10-CM | POA: Diagnosis not present

## 2020-07-26 DIAGNOSIS — Z1159 Encounter for screening for other viral diseases: Secondary | ICD-10-CM | POA: Diagnosis not present

## 2020-08-04 ENCOUNTER — Ambulatory Visit: Payer: BC Managed Care – PPO | Admitting: Adult Health

## 2020-08-04 ENCOUNTER — Ambulatory Visit: Payer: BC Managed Care – PPO | Admitting: Podiatry

## 2020-08-04 ENCOUNTER — Other Ambulatory Visit: Payer: Self-pay | Admitting: Podiatry

## 2020-08-04 ENCOUNTER — Other Ambulatory Visit: Payer: Self-pay

## 2020-08-04 ENCOUNTER — Ambulatory Visit (INDEPENDENT_AMBULATORY_CARE_PROVIDER_SITE_OTHER): Payer: BC Managed Care – PPO

## 2020-08-04 DIAGNOSIS — M722 Plantar fascial fibromatosis: Secondary | ICD-10-CM

## 2020-08-04 DIAGNOSIS — M79672 Pain in left foot: Secondary | ICD-10-CM

## 2020-08-09 DIAGNOSIS — Z1159 Encounter for screening for other viral diseases: Secondary | ICD-10-CM | POA: Diagnosis not present

## 2020-08-09 DIAGNOSIS — Z20828 Contact with and (suspected) exposure to other viral communicable diseases: Secondary | ICD-10-CM | POA: Diagnosis not present

## 2020-08-10 ENCOUNTER — Encounter: Payer: Self-pay | Admitting: Podiatry

## 2020-08-10 NOTE — Progress Notes (Signed)
Subjective:  Patient ID: Yolanda Espinoza, female    DOB: 1955-08-05,  MRN: [id number]  Chief Complaint  Patient presents with   Foot Pain    Left plantar heel pain 2 day duration no known injuries.    65 y.o. female presents with the above complaint. Complaint left heel pain that has been going for 2 daysPatient presents with complaint left heel pain that has been for 2 days.  Patient states very painful to walk painful to touch.  She does not have any known injuries.  It is burning sensation.  She has tried some over-the-counter medication none of which has helped.  She has not seen anyone else prior to seeing me.   She denies any other acute complaints   Review of Systems: Negative except as noted in the HPI. Denies N/V/F/Ch.  Past Medical History:  Diagnosis Date   Anemia    Arthritis    Chronic diastolic heart failure (HCC)    GERD (gastroesophageal reflux disease)    Heart murmur    Hyperlipidemia    Hypertension may, 1973   Mitral regurgitation    a. Echo 01/2012 mild LVH, EF 55-65%, Gr 2 DD, mod MR, mild LAE, PASP 36;  b. Echo (02/2013):  EF 55-60%, Gr 1 DD, mild to mod MR, mild LAE (no sig change since prior echo) // c. Echo 10/18: EF 55-60, normal wall motion, grade 2 diastolic dysfunction, trivial AI, mild to moderate MR, mild LAE, trivial PI, PASP 38     OSA on CPAP    7 yrs   Seizures ([place]) 01/2017   Stroke ([place]) 07   no weakness jan 29th 2019 no defecits   Subarachnoid hemorrhage due to ruptured aneurysm ([place])    2003 - s/p coiling   Unspecified vitamin D deficiency     Current Outpatient Medications:    allopurinol (ZYLOPRIM) 100 MG tablet, Take  1 tablet  Daily  to Prevent Gout, Disp: 90 tablet, Rfl: 3   amLODipine (NORVASC) 10 MG tablet, TAKE 1 TABLET BY MOUTH DAILY FOR BLOOD PRESSURE, Disp: 90 tablet, Rfl: 1   aspirin EC 81 MG tablet, Take 1 tablet (81 mg total) by mouth daily., Disp: , Rfl:    Calcium Carb-Cholecalciferol (CALCIUM 600 + D PO), Take 1 tablet by  mouth daily., Disp: , Rfl:    cholecalciferol (VITAMIN D3) 25 MCG (1000 UT) tablet, Take 1,000 Units by mouth daily., Disp: , Rfl:    cyclobenzaprine (FLEXERIL) 10 MG tablet, TAKE 1 TABLET BY MOUTH EVERY NIGHT AT BEDTIME AS NEEDED FOR MUSCLE SPASMS, Disp: 90 tablet, Rfl: 1   diclofenac (VOLTAREN) 75 MG EC tablet, Take 1 tablet   2 x /day  with food for Pain & Inflammation, Disp: 180 tablet, Rfl: 3   diclofenac sodium (VOLTAREN) 1 % GEL, Apply 2 g topically 4 (four) times daily. Right wrist and Right elbow, Disp: 3 Tube, Rfl: 2   estrogens, conjugated, (PREMARIN) 0.625 MG tablet, Take  1 tablet  Daily   for Hormones, Disp: 90 tablet, Rfl: 1   famotidine (PEPCID) 20 MG tablet, TAKE 1 TABLET BY MOUTH EVERY MORNING 30-60 MINUTES PRIOR TO BREAKFAST FOR REFLUX, Disp: 90 tablet, Rfl: 0   ferrous sulfate 325 (65 FE) MG tablet, Take 325 mg by mouth daily with breakfast., Disp: , Rfl:    fexofenadine (ALLEGRA ALLERGY) 180 MG tablet, Take 1 tablet (180 mg total) by mouth daily., Disp: 30 tablet, Rfl: 2   furosemide (LASIX) 40 MG tablet, Take  1 tablet Daily for BP, Fluid Retention & Ankle Swelling, Disp: 90 tablet, Rfl: 1   levETIRAcetam (KEPPRA) 1000 MG tablet, Take 1 tablet (1,000 mg total) by mouth 2 (two) times daily., Disp: 180 tablet, Rfl: 4   Multiple Vitamins-Minerals (MULTIVITAMIN WITH MINERALS) tablet, Take 1 tablet by mouth daily., Disp: , Rfl:    nystatin cream (MYCOSTATIN), Apply topically 2 (two) times daily., Disp: , Rfl:    olmesartan (BENICAR) 40 MG tablet, Take  1 tablet  Daily  for BP, Disp: 90 tablet, Rfl: 3   pantoprazole (PROTONIX) 40 MG tablet, TAKE 1 TABLET BY MOUTH DAILY IN THE EVENING TO PREVENT INDIGESTION AND HEARTBURN, Disp: 90 tablet, Rfl: 3   ticagrelor (BRILINTA) 90 MG TABS tablet, Take 1 tablet  2 x /day (every 12 hours )  to prevent Blood Clots, Disp: 180 tablet, Rfl: 1   vitamin B-12 (CYANOCOBALAMIN) 500 MCG tablet, Take 1,000 mcg by mouth daily., Disp: , Rfl:   Social  History   Tobacco Use  Smoking Status Former   Packs/day: 0.25   Years: 3.00   Pack years: 0.75   Types: Cigarettes   Quit date: 01/24/1999   Years since quitting: 21.5  Smokeless Tobacco Never    Allergies  Allergen Reactions   Ace Inhibitors Nausea And Vomiting   Aspirin Nausea And Vomiting    Can take coated Aspirin   Objective:  There were no vitals filed for this visit. There is no height or weight on file to calculate BMI. Constitutional Well developed. Well nourished.  Vascular Dorsalis pedis pulses palpable bilaterally. Posterior tibial pulses palpable bilaterally. Capillary refill normal to all digits.  No cyanosis or clubbing noted. Pedal hair growth normal.  Neurologic Normal speech. Oriented to person, place, and time. Epicritic sensation to light touch grossly present bilaterally.  Dermatologic Nails well groomed and normal in appearance. No open wounds. No skin lesions.  Orthopedic: Normal joint ROM without pain or crepitus bilaterally. No visible deformities. Tender to palpation at the calcaneal tuber left. No pain with calcaneal squeeze left. Ankle ROM diminished range of motion left. Silfverskiold Test: positive left.   Radiographs: Taken and reviewed. No acute fractures or dislocations. No evidence of stress fracture.  Plantar heel spur present. Posterior heel spur present.   Assessment:   1. Plantar fasciitis of left foot    Plan:  Patient was evaluated and treated and all questions answered.  Plantar Fasciitis, left - XR reviewed as above.  - Educated on icing and stretching. Instructions given.  - Injection delivered to the plantar fascia as below. - DME: Plantar Fascial Brace - Pharmacologic management: None  Procedure: Injection Tendon/Ligament Location: Left plantar fascia at the glabrous junction; medial approach. Skin Prep: alcohol Injectate: 0.5 cc 0.5% marcaine plain, 0.5 cc of 1% Lidocaine, 0.5 cc kenalog 10. Disposition:  Patient tolerated procedure well. Injection site dressed with a band-aid.  No follow-ups on file.

## 2020-08-12 DIAGNOSIS — Z1159 Encounter for screening for other viral diseases: Secondary | ICD-10-CM | POA: Diagnosis not present

## 2020-08-12 DIAGNOSIS — Z20828 Contact with and (suspected) exposure to other viral communicable diseases: Secondary | ICD-10-CM | POA: Diagnosis not present

## 2020-08-19 DIAGNOSIS — Z1159 Encounter for screening for other viral diseases: Secondary | ICD-10-CM | POA: Diagnosis not present

## 2020-08-19 DIAGNOSIS — Z20828 Contact with and (suspected) exposure to other viral communicable diseases: Secondary | ICD-10-CM | POA: Diagnosis not present

## 2020-09-01 ENCOUNTER — Ambulatory Visit: Payer: BC Managed Care – PPO | Admitting: Podiatry

## 2020-09-20 ENCOUNTER — Other Ambulatory Visit: Payer: Self-pay

## 2020-09-20 NOTE — Progress Notes (Deleted)
3 MONTH FOLLOW UP  Assessment and Plan:   Chronic diastolic heart failure Emphasized need for weight loss  Emphasized salt restriction, less than '2000mg'$  a day. Encouraged daily monitoring of the patient's weight, call office if 3 lb weight loss or gain in a day.  Encouraged regular exercise.  decrease your fluid intake to less than 2 L daily  Cardiology is managing; appears euvolemic today   Essential hypertension - continue medications, DASH diet, exercise and monitor at home. Call if greater than 130/80.  - CBC with Differential/Platelet - CMP/GFR - Magnesium   Abnormal glucose Discussed general issues about diabetes pathophysiology and management., Educational material distributed., Suggested low cholesterol diet., Encouraged aerobic exercise., Discussed foot care., Reminded to get yearly retinal exam. - Hemoglobin A1c  Hyperlipidemia -continue medications, check lipids, decrease fatty foods, increase activity. - Lipid panel  Vitamin D deficiency Continue supplement  - Vit D  25 hydroxy (rtn osteoporosis monitoring) - defer  Medication management - Magnesium  OSA on CPAP Dr. Brett Fairy following,recommended CPAP    Brain aneurysm Dr. Estanislado Pandy follows  S/p coil, on brilinta Control blood pressure, cholesterol, glucose, increase exercise.    Seizures (HCC) R/t CVA/aneurysm; Dr. Estanislado Pandy following, on keppra without recent dose change, he checks levels annually  Gout of foot, unspecified cause, unspecified chronicity, unspecified laterality - Gout- recheck Uric acid as needed, Diet discussed, continue medications.  Gastroesophageal reflux disease, esophagitis presence not specified Well managed on current medications Discussed diet, avoiding triggers and other lifestyle changes  Morbid obesity ([place]) - BMI 30+ with OSA - long discussion about weight loss, diet, and exercise -recommended diet heavy in fruits and veggies and low in animal meats, cheeses, and dairy  products  Persistent cough Cough- multifactorial-   Continue PPI, raise HOB on blocks, GERD diet, no improvement with antihistamine and adding AM dosing; Recommended ENT evaluation for laryngoscopy - declines today, plans to discuss with Dr. Quincy Simmonds Also possible mild dysphagia leading to daytime cough with drinking/eating - consider speech pathology referral if cough not improving  Discussed med's effects and SE's. Labs and tests as requested with regular follow-up as recommended. Over 30 minutes of exam, counseling, chart review, and complex, high level critical decision making was performed this visit.  Future Appointments  Date Time Provider Vivian  09/21/2020 11:30 AM Liane Comber, NP [facility] None  01/26/2021 10:00 AM Liane Comber, NP [facility] None    HPI  65 y.o. female  presents for 3 month follow up htn, CHF, hld, morbid obesity, vit D def.   She was admitted 01/2017 for left ICA aneursym for a pipeline stent but had a seizure and AMS after her procedure, she is on brilinta, following with Dr. Corena Pilgrim, on keppra 1000 mg BID (he checks levels annually). Mas mild residual R arm weakness and some spasticity. Had follow up MRI 04/30/2020 which showed essentially unchanged, stable chronic microvascular ischemia.  She has been on CPAP since 2013, had at [facility], was doing well but recently CPAP machine was recalled, needing replacement. She was referred to Dr. Brett Fairy, recent sleep study 04/2020 showed severe OSA with REM hypoxia, AHI was 41.8/h, recommended CPAP at 16 cm. Lost phone number to coordinate CPAP pickup - Dr. Brett Fairy contact information given  She has reported persistent dry cough, triggered by inhalation, drinking water or any food intake, but notably worse at night. She reports coughing with drinking or food is ongoing since her aneurysm/stroke/seizure, but has noted worse coughing at night ? Became worse after  she stopped using CPAP machine. She also  admits to allergy sx, she is on daily antihistamine without benefit, denies post-nasal drip. She also has GERD, taking pantoprazole 40 mg at night without benefit. Discussed ENT for laryngoscopy but declines at this time. She plans to discuss with Dr. Collene Mares.   and she is on estrogen pills has been taking premarin 0.9 mg for over 10 years, recently reduce to 0.625 mg. Reports doing well with this dose.   BMI is There is no height or weight on file to calculate BMI., she has been working on diet and exercise. Wt Readings from Last 3 Encounters:  06/17/20 214 lb (97.1 kg)  05/18/20 218 lb 9.6 oz (99.2 kg)  04/19/20 218 lb (98.9 kg)   PVD and CHF MR, follows with Dr. Sondra Come.  Had ECHO 07/2019 showing normal LV function, Mod MR, mass in the RA, but follow up cardiac MRI on 10/02/2019 showed no evidence of atrial mass.   Her blood pressure has been controlled at home, states has been good at home, today their BP is   She does workout, walks daily for 30 mins. She denies chest pain, shortness of breath, dizziness.   She has CKD III *** Lab Results  Component Value Date   GFRAA 16 (L) 06/17/2020   She is not on cholesterol medication and denies myalgias. Her cholesterol is at goal. The cholesterol last visit was:   Lab Results  Component Value Date   CHOL 213 (H) 06/17/2020   HDL 107 06/17/2020   [place] 85 06/17/2020   TRIG 112 06/17/2020   CHOLHDL 2.0 06/17/2020   She has been working on diet and exercise for glucose management, and denies paresthesia of the feet, polydipsia, polyuria and visual disturbances. Last A1C in the office was:  Lab Results  Component Value Date   HGBA1C 5.5 01/26/2020    Patient is on allopurinol for gout and does not report a recent flare.  Lab Results  Component Value Date   LABURIC 4.9 06/17/2020   Patient is on Vitamin D supplement, taking 2 caps of unsure dose ? 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 Some iron def as well and on ferrous sulphate. Dr. Earlie Server follows.  CBC Latest Ref Rng & Units 06/17/2020 03/30/2020 03/21/2020  WBC 3.8 - 10.8 Thousand/uL 6.8 8.3 7.6  Hemoglobin 11.7 - 15.5 g/dL 9.6(L) 9.5(L) 9.5(L)  Hematocrit 35.0 - 45.0 % 31.1(L) 30.1(L) 29.7(L)  Platelets 140 - 400 Thousand/uL 317 303 305     Current Medications:   Current Outpatient Medications (Endocrine & Metabolic):    estrogens, conjugated, (PREMARIN) 0.625 MG tablet, Take  1 tablet  Daily   for Hormones  Current Outpatient Medications (Cardiovascular):    amLODipine (NORVASC) 10 MG tablet, TAKE 1 TABLET BY MOUTH DAILY FOR BLOOD PRESSURE   furosemide (LASIX) 40 MG tablet, Take 1 tablet Daily for BP, Fluid Retention & Ankle Swelling   olmesartan (BENICAR) 40 MG tablet, Take  1 tablet  Daily  for BP  Current Outpatient Medications (Respiratory):    fexofenadine (ALLEGRA ALLERGY) 180 MG tablet, Take 1 tablet (180 mg total) by mouth daily.  Current Outpatient Medications (Analgesics):    allopurinol (ZYLOPRIM) 100 MG tablet, Take  1 tablet  Daily  to Prevent Gout  aspirin EC 81 MG tablet, Take 1 tablet (81 mg total) by mouth daily.   diclofenac (VOLTAREN) 75 MG EC tablet, Take 1 tablet   2 x /day  with food for Pain & Inflammation  Current Outpatient Medications (Hematological):    ferrous sulfate 325 (65 FE) MG tablet, Take 325 mg by mouth daily with breakfast.   ticagrelor (BRILINTA) 90 MG TABS tablet, Take 1 tablet  2 x /day (every 12 hours )  to prevent Blood Clots   vitamin B-12 (CYANOCOBALAMIN) 500 MCG tablet, Take 1,000 mcg by mouth daily.  Current Outpatient Medications (Other):    Calcium Carb-Cholecalciferol (CALCIUM 600 + D PO), Take 1 tablet by mouth daily.   cholecalciferol (VITAMIN D3) 25 MCG (1000 UT) tablet, Take 1,000  Units by mouth daily.   cyclobenzaprine (FLEXERIL) 10 MG tablet, TAKE 1 TABLET BY MOUTH EVERY NIGHT AT BEDTIME AS NEEDED FOR MUSCLE SPASMS   diclofenac sodium (VOLTAREN) 1 % GEL, Apply 2 g topically 4 (four) times daily. Right wrist and Right elbow   famotidine (PEPCID) 20 MG tablet, TAKE 1 TABLET BY MOUTH EVERY MORNING 30-60 MINUTES PRIOR TO BREAKFAST FOR REFLUX   levETIRAcetam (KEPPRA) 1000 MG tablet, Take 1 tablet (1,000 mg total) by mouth 2 (two) times daily.   Multiple Vitamins-Minerals (MULTIVITAMIN WITH MINERALS) tablet, Take 1 tablet by mouth daily.   nystatin cream (MYCOSTATIN), Apply topically 2 (two) times daily.   pantoprazole (PROTONIX) 40 MG tablet, TAKE 1 TABLET BY MOUTH DAILY IN THE EVENING TO PREVENT INDIGESTION AND HEARTBURN  Medical History:  Past Medical History:  Diagnosis Date   Anemia    Arthritis    Chronic diastolic heart failure (HCC)    GERD (gastroesophageal reflux disease)    Heart murmur    Hyperlipidemia    Hypertension may, 1973   Mitral regurgitation    a. Echo 01/2012 mild LVH, EF 55-65%, Gr 2 DD, mod MR, mild LAE, PASP 36;  b. Echo (02/2013):  EF 55-60%, Gr 1 DD, mild to mod MR, mild LAE (no sig change since prior echo) // c. Echo 10/18: EF 55-60, normal wall motion, grade 2 diastolic dysfunction, trivial AI, mild to moderate MR, mild LAE, trivial PI, PASP 38     OSA on CPAP    7 yrs   Seizures ([place]) 01/2017   Stroke ([place]) 07   no weakness jan 29th 2019 no defecits   Subarachnoid hemorrhage due to ruptured aneurysm ([place])    2003 - s/p coiling   Unspecified vitamin D deficiency    Allergies Allergies  Allergen Reactions   Ace Inhibitors Nausea And Vomiting   Aspirin Nausea And Vomiting    Can take coated Aspirin   SURGICAL HISTORY She  has a past surgical history that includes Aneurysm coiling; head surgery; Tonsillectomy and adenoidectomy; Radiology with anesthesia (N/A, 04/23/2013); IR 3D Independent Wkst (09/04/2016); IR Angiogram Extremity Left  (09/04/2016); IR ANGIO INTRA EXTRACRAN SEL COM CAROTID INNOMINATE BILAT MOD SED (09/04/2016); IR ANGIO VERTEBRAL SEL VERTEBRAL BILAT MOD SED (09/04/2016); IR Radiologist Eval & Mgmt (10/11/2016); Radiology with anesthesia (N/A, 10/30/2016); Tubal ligation; Radiology with anesthesia (N/A, 02/21/2017); IR Transcath/Emboliz (02/21/2017); IR ANGIO INTRA EXTRACRAN SEL INTERNAL CAROTID UNI L MOD SED (02/21/2017); IR NEURO EACH ADD'L AFTER BASIC UNI LEFT (MS) (02/21/2017); IR Angiogram Follow Up Study (02/21/2017); IR Radiologist Eval & Mgmt (03/07/2017); Abdominal hysterectomy; Esophagogastroduodenoscopy (egd) with propofol (N/A, 09/05/2018); Colonoscopy with propofol (N/A, 09/05/2018); and Givens capsule study (N/A, 10/17/2018).   FAMILY HISTORY Her family history  includes Cancer (age of onset: 65) in her sister; Hypertension in her father, sister, sister, and sister; Kidney disease in her father.   SOCIAL HISTORY She  reports that she quit smoking about 21 years ago. Her smoking use included cigarettes. She has a 0.75 pack-year smoking history. She has never used smokeless tobacco. She reports that she does not drink alcohol and does not use drugs.  Review of Systems: Review of Systems  Constitutional:  Negative for malaise/fatigue and weight loss.  HENT:  Negative for hearing loss and tinnitus.   Eyes:  Negative for blurred vision and double vision.  Respiratory:  Positive for cough (dry, worse at night). Negative for sputum production, shortness of breath and wheezing.   Cardiovascular:  Negative for chest pain, palpitations, orthopnea, claudication and leg swelling.  Gastrointestinal:  Negative for abdominal pain, blood in stool, constipation, diarrhea, heartburn, melena, nausea and vomiting.  Genitourinary: Negative.   Musculoskeletal:  Negative for joint pain and myalgias.  Skin:  Negative for rash.  Neurological:  Negative for dizziness, tingling, sensory change, weakness and headaches.  Endo/Heme/Allergies:   Negative for polydipsia.  Psychiatric/Behavioral: Negative.    All other systems reviewed and are negative.  Physical Exam: Estimated body mass index is 30.71 kg/m as calculated from the following:   Height as of 05/18/20: '5\' 10"'$  (1.778 m).   Weight as of 06/17/20: 214 lb (97.1 kg). There were no vitals taken for this visit. General Appearance: Well nourished, in no apparent distress.  Eyes: PERRLA, EOMs, conjunctiva no swelling or erythema Sinuses: No Frontal/maxillary tenderness  ENT/Mouth: Ext aud canals clear, normal light reflex with TMs without erythema, bulging.  No erythema, swelling, or exudate on post pharynx. Tonsils not swollen or erythematous. Hearing normal.  Neck: Supple, thyroid subtly enlarged, no nodules. No bruits  Respiratory: Respiratory effort normal, BS equal bilaterally without rales, rhonchi, wheezing or stridor.  Cardio: RRR without murmur, without rubs or gallops. Brisk peripheral pulses with 2+ edema.  Chest: symmetric, with normal excursions and percussion.  Breasts: breasts appear normal, no suspicious masses, no skin or nipple changes or axillary nodes. Abdomen: Soft, nontender, no guarding, rebound, hernias, masses, or organomegaly.  Lymphatics: Non tender without lymphadenopathy.  Genitourinary: defer Musculoskeletal: Full ROM all peripheral extremities,5/5 strength, and antalgic gait.  Skin: Warm, dry without rashes, lesions, ecchymosis. Neuro: Cranial nerves intact, reflexes equal bilaterally. Normal muscle tone, no cerebellar symptoms. Sensation intact.  Psych: Awake and oriented X 3, normal affect, Insight and Judgment appropriate.   Izora Ribas 8:31 AM [facility]

## 2020-09-21 ENCOUNTER — Ambulatory Visit: Payer: BC Managed Care – PPO | Admitting: Adult Health

## 2020-09-21 DIAGNOSIS — I10 Essential (primary) hypertension: Secondary | ICD-10-CM

## 2020-09-21 DIAGNOSIS — Z79899 Other long term (current) drug therapy: Secondary | ICD-10-CM

## 2020-09-21 DIAGNOSIS — R7309 Other abnormal glucose: Secondary | ICD-10-CM

## 2020-09-21 DIAGNOSIS — M109 Gout, unspecified: Secondary | ICD-10-CM

## 2020-09-21 DIAGNOSIS — G40909 Epilepsy, unspecified, not intractable, without status epilepticus: Secondary | ICD-10-CM

## 2020-09-21 DIAGNOSIS — G4733 Obstructive sleep apnea (adult) (pediatric): Secondary | ICD-10-CM

## 2020-09-21 DIAGNOSIS — E785 Hyperlipidemia, unspecified: Secondary | ICD-10-CM

## 2020-09-21 DIAGNOSIS — I609 Nontraumatic subarachnoid hemorrhage, unspecified: Secondary | ICD-10-CM

## 2020-09-21 DIAGNOSIS — E559 Vitamin D deficiency, unspecified: Secondary | ICD-10-CM

## 2020-09-21 DIAGNOSIS — I5032 Chronic diastolic (congestive) heart failure: Secondary | ICD-10-CM

## 2020-09-21 DIAGNOSIS — N1832 Chronic kidney disease, stage 3b: Secondary | ICD-10-CM

## 2020-09-22 ENCOUNTER — Other Ambulatory Visit: Payer: Self-pay

## 2020-09-22 MED ORDER — TICAGRELOR 90 MG PO TABS: ORAL_TABLET | ORAL | 1 refills | Status: DC

## 2020-09-22 MED ORDER — FAMOTIDINE 20 MG PO TABS: ORAL_TABLET | ORAL | 0 refills | Status: DC

## 2020-09-22 MED ORDER — LEVETIRACETAM 1000 MG PO TABS: 1000.0000 mg | ORAL_TABLET | Freq: Two times a day (BID) | ORAL | 4 refills | Status: DC

## 2020-09-28 ENCOUNTER — Other Ambulatory Visit: Payer: Self-pay

## 2020-09-28 DIAGNOSIS — M545 Low back pain, unspecified: Secondary | ICD-10-CM

## 2020-09-28 DIAGNOSIS — R1032 Left lower quadrant pain: Secondary | ICD-10-CM

## 2020-09-28 MED ORDER — PANTOPRAZOLE SODIUM 40 MG PO TBEC: DELAYED_RELEASE_TABLET | ORAL | 3 refills | Status: DC

## 2020-09-28 MED ORDER — AMLODIPINE BESYLATE 10 MG PO TABS: ORAL_TABLET | ORAL | 1 refills | Status: DC

## 2020-09-28 MED ORDER — TICAGRELOR 90 MG PO TABS: ORAL_TABLET | ORAL | 1 refills | Status: DC

## 2020-09-28 MED ORDER — LEVETIRACETAM 1000 MG PO TABS: 1000.0000 mg | ORAL_TABLET | Freq: Two times a day (BID) | ORAL | 4 refills | Status: DC

## 2020-09-28 MED ORDER — FAMOTIDINE 20 MG PO TABS: ORAL_TABLET | ORAL | 0 refills | Status: DC

## 2020-09-28 MED ORDER — ALLOPURINOL 100 MG PO TABS: ORAL_TABLET | ORAL | 3 refills | Status: DC

## 2020-09-28 MED ORDER — CYCLOBENZAPRINE HCL 10 MG PO TABS: ORAL_TABLET | ORAL | 1 refills | Status: DC

## 2020-09-28 MED ORDER — DICLOFENAC SODIUM 75 MG PO TBEC: DELAYED_RELEASE_TABLET | ORAL | 3 refills | Status: DC

## 2020-09-28 NOTE — Progress Notes (Signed)
3 MONTH FOLLOW UP  Assessment and Plan:   Chronic diastolic heart failure Emphasized need for weight loss  Emphasized salt restriction, less than '2000mg'$  a day. Encouraged daily monitoring of the patient's weight, call office if 3 lb weight loss or gain in a day.  Encouraged regular exercise.  decrease your fluid intake to less than 2 L daily  Cardiology is managing; appears euvolemic today   Essential hypertension - continue medications, DASH diet, exercise, weight loss and monitor at home. Call if greater than 140/80.  - CBC with Differential/Platelet - CMP/GFR - Magnesium   Abnormal glucose Recent A1Cs at goal Discussed diet/exercise, weight management  Defer A1C; check CMP  Hyperlipidemia -continue medications, check lipids, decrease fatty foods, increase activity. - Lipid panel  Vitamin D deficiency Continue supplement   Medication management - Magnesium  OSA on CPAP Dr. Brett Fairy following, recommended CPAP, pending receipt to start  Brain aneurysm Dr. Estanislado Pandy follows  S/p coil, on brilinta Control blood pressure, cholesterol, glucose, increase exercise.    Seizures (HCC) R/t CVA/aneurysm; Dr. Estanislado Pandy following, on keppra without recent dose change,  Check keppra levels  Gout of foot, unspecified cause, unspecified chronicity, unspecified laterality - Gout- recheck Uric acid as needed, Diet discussed, continue medications.  Gastroesophageal reflux disease, esophagitis presence not specified Well managed on current medications;  Discussed diet, avoiding triggers and other lifestyle changes  Morbid obesity ([place]) - BMI 30+ with OSA - long discussion about weight loss, diet, and exercise -recommended diet heavy in fruits and veggies and low in animal meats, cheeses, and dairy products  Persistent cough Cough- multifactorial-   Continue restart PPI, raise HOB on blocks, GERD diet plans to discuss with Dr. Quincy Simmonds Also possible mild dysphagia leading to  daytime cough with drinking/eating - consider speech pathology referral if cough not improving  Discussed med's effects and SE's. Labs and tests as requested with regular follow-up as recommended. Over 30 minutes of exam, counseling, chart review, and complex, high level critical decision making was performed this visit.  Future Appointments  Date Time Provider [place]  01/26/2021 10:00 AM Liane Comber, NP [facility] None    HPI  65 y.o. female  presents for 3 month follow up htn, CHF, hld, morbid obesity, vit D def.   She was admitted 01/2017 for left ICA aneursym for a pipeline stent but had a seizure and AMS after her procedure, she is on brilinta, following with Dr. Corena Pilgrim, on keppra 1000 mg BID (he checks levels annually). Mas mild residual R arm weakness and some spasticity. Had follow up MRI 04/30/2020 which showed essentially unchanged, stable chronic microvascular ischemia.  She was on CPAP since 2013, had at [facility], was doing well but recently CPAP machine was recalled, needing replacement. She was referred to Dr. Brett Fairy, recent sleep study 04/2020 showed severe OSA with REM hypoxia, AHI was 41.8/h, recommended CPAP at 16 cm. CPAP is backordered, pending receipt.   She has reported persistent dry cough, triggered by inhalation, drinking water or any food intake, but notably worse at night. She reports coughing with drinking or food is ongoing since her aneurysm/stroke/seizure, but has noted worse coughing at night. Was started on famotidine and PPI, reduced caffeine with some improvement.   She is on estrogen pills for hot flashes, had been taking premarin 0.9 mg for over 10 years, last visit reduced to 0.625 mg. Reports doing well with this dose and receptive to tapering further.   BMI is Body mass index is 30.99 kg/m.,  she has been working on diet and exercise, trying to cut down on candy and sodas, less meat.  Wt Readings from Last 3 Encounters:  09/29/20 216 lb  (98 kg)  06/17/20 214 lb (97.1 kg)  05/18/20 218 lb 9.6 oz (99.2 kg)   PVD and CHF MR, follows with Dr. Sondra Come.  Had ECHO 07/2019 showing normal LV function, Mod MR, mass in the RA, but follow up cardiac MRI on 10/02/2019 showed no evidence of atrial mass.   Her blood pressure has been controlled at home, states has been good at home, today their BP is BP: 136/86 She does workout, walks daily for 30 mins. She denies chest pain, shortness of breath, dizziness.   She has CKD III, stable following admission for anuerysm. Avoids NSAIDs, 8+ glasses of water daily, less sodas Lab Results  Component Value Date   GFRAA 85 (L) 06/17/2020   She is not on cholesterol medication and denies myalgias. Her cholesterol is at goal. The cholesterol last visit was:   Lab Results  Component Value Date   CHOL 213 (H) 06/17/2020   HDL 107 06/17/2020   [place] 85 06/17/2020   TRIG 112 06/17/2020   CHOLHDL 2.0 06/17/2020   She has been working on diet and exercise for glucose management, and denies paresthesia of the feet, polydipsia, polyuria and visual disturbances. Last A1C in the office was:  Lab Results  Component Value Date   HGBA1C 5.5 01/26/2020    Patient is on allopurinol for gout and does not report a recent flare.  Lab Results  Component Value Date   LABURIC 4.9 06/17/2020   Patient is on Vitamin D supplement, taking 2 caps of unsure dose ? 5000 IU Lab Results  Component Value Date   VD25OH 41 06/17/2020     She has persistent anemia, was recently referred to evaluation by hematology with unremarkable workup, biopsy/aspiration were nonspecific and felt likely anemia of chronic disease secondary renal. Some iron def as well and on ferrous sulphate. Dr. Earlie Server follows.  CBC Latest Ref Rng & Units 06/17/2020 03/30/2020 03/21/2020  WBC 3.8 - 10.8 Thousand/uL 6.8 8.3 7.6  Hemoglobin 11.7 - 15.5 g/dL 9.6(L) 9.5(L) 9.5(L)  Hematocrit 35.0 - 45.0 % 31.1(L) 30.1(L) 29.7(L)  Platelets 140 - 400  Thousand/uL 317 303 305      Current Medications:   Current Outpatient Medications (Endocrine & Metabolic):    estrogens, conjugated, (PREMARIN) 0.45 MG tablet, Take  1 tablet  Daily   for Hormones  Current Outpatient Medications (Cardiovascular):    amLODipine (NORVASC) 10 MG tablet, TAKE 1 TABLET BY MOUTH DAILY FOR BLOOD PRESSURE   furosemide (LASIX) 40 MG tablet, Take 1 tablet Daily for BP, Fluid Retention & Ankle Swelling   olmesartan (BENICAR) 40 MG tablet, Take  1 tablet  Daily  for BP  Current Outpatient Medications (Respiratory):    fexofenadine (ALLEGRA ALLERGY) 180 MG tablet, Take 1 tablet (180 mg total) by mouth daily.  Current Outpatient Medications (Analgesics):    allopurinol (ZYLOPRIM) 100 MG tablet, Take  1 tablet  Daily  to Prevent Gout   aspirin EC 81 MG tablet, Take 1 tablet (81 mg total) by mouth daily.  Current Outpatient Medications (Hematological):    ticagrelor (BRILINTA) 90 MG TABS tablet, Take 1 tablet  2 x /day (every 12 hours )  to prevent Blood Clots   vitamin B-12 (CYANOCOBALAMIN) 500 MCG tablet, Take 1,000 mcg by mouth daily.  Current Outpatient Medications (Other):    cholecalciferol (  VITAMIN D3) 25 MCG (1000 UT) tablet, Take 1,000 Units by mouth daily.   famotidine (PEPCID) 20 MG tablet, TAKE 1 TABLET BY MOUTH EVERY MORNING 30-60 MINUTES PRIOR TO BREAKFAST FOR REFLUX   levETIRAcetam (KEPPRA) 1000 MG tablet, Take 1 tablet (1,000 mg total) by mouth 2 (two) times daily.   Multiple Vitamins-Minerals (MULTIVITAMIN WITH MINERALS) tablet, Take 1 tablet by mouth daily.   pantoprazole (PROTONIX) 40 MG tablet, Take one tablet at bedtime  Medical History:  Past Medical History:  Diagnosis Date   Anemia    Arthritis    Chronic diastolic heart failure (HCC)    GERD (gastroesophageal reflux disease)    Heart murmur    Hyperlipidemia    Hypertension may, 1973   Mitral regurgitation    a. Echo 01/2012 mild LVH, EF 55-65%, Gr 2 DD, mod MR, mild LAE, PASP 36;   b. Echo (02/2013):  EF 55-60%, Gr 1 DD, mild to mod MR, mild LAE (no sig change since prior echo) // c. Echo 10/18: EF 55-60, normal wall motion, grade 2 diastolic dysfunction, trivial AI, mild to moderate MR, mild LAE, trivial PI, PASP 38     OSA on CPAP    7 yrs   Seizures ([place]) 01/2017   Stroke ([place]) 07   no weakness jan 29th 2019 no defecits   Subarachnoid hemorrhage due to ruptured aneurysm ([place])    2003 - s/p coiling   Unspecified vitamin D deficiency    Allergies Allergies  Allergen Reactions   Ace Inhibitors Nausea And Vomiting   Aspirin Nausea And Vomiting    Can take coated Aspirin   SURGICAL HISTORY She  has a past surgical history that includes Aneurysm coiling; head surgery; Tonsillectomy and adenoidectomy; Radiology with anesthesia (N/A, 04/23/2013); IR 3D Independent Wkst (09/04/2016); IR Angiogram Extremity Left (09/04/2016); IR ANGIO INTRA EXTRACRAN SEL COM CAROTID INNOMINATE BILAT MOD SED (09/04/2016); IR ANGIO VERTEBRAL SEL VERTEBRAL BILAT MOD SED (09/04/2016); IR Radiologist Eval & Mgmt (10/11/2016); Radiology with anesthesia (N/A, 10/30/2016); Tubal ligation; Radiology with anesthesia (N/A, 02/21/2017); IR Transcath/Emboliz (02/21/2017); IR ANGIO INTRA EXTRACRAN SEL INTERNAL CAROTID UNI L MOD SED (02/21/2017); IR NEURO EACH ADD'L AFTER BASIC UNI LEFT (MS) (02/21/2017); IR Angiogram Follow Up Study (02/21/2017); IR Radiologist Eval & Mgmt (03/07/2017); Abdominal hysterectomy; Esophagogastroduodenoscopy (egd) with propofol (N/A, 09/05/2018); Colonoscopy with propofol (N/A, 09/05/2018); and Givens capsule study (N/A, 10/17/2018).   FAMILY HISTORY Her family history includes Cancer (age of onset: 50) in her sister; Hypertension in her father, sister, sister, and sister; Kidney disease in her father.   SOCIAL HISTORY She  reports that she quit smoking about 21 years ago. Her smoking use included cigarettes. She has a 0.75 pack-year smoking history. She has never used smokeless tobacco. She  reports that she does not drink alcohol and does not use drugs.  Review of Systems: Review of Systems  Constitutional:  Negative for malaise/fatigue and weight loss.  HENT:  Negative for hearing loss and tinnitus.   Eyes:  Negative for blurred vision and double vision.  Respiratory:  Positive for cough (dry, worse at night). Negative for sputum production, shortness of breath and wheezing.   Cardiovascular:  Negative for chest pain, palpitations, orthopnea, claudication and leg swelling.  Gastrointestinal:  Negative for abdominal pain, blood in stool, constipation, diarrhea, heartburn, melena, nausea and vomiting.  Genitourinary: Negative.   Musculoskeletal:  Negative for joint pain and myalgias.  Skin:  Negative for rash.  Neurological:  Negative for dizziness, tingling, sensory change, weakness and  headaches.  Endo/Heme/Allergies:  Negative for polydipsia.  Psychiatric/Behavioral: Negative.    All other systems reviewed and are negative.  Physical Exam: Estimated body mass index is 30.99 kg/m as calculated from the following:   Height as of 05/18/20: '5\' 10"'$  (1.778 m).   Weight as of this encounter: 216 lb (98 kg). BP 136/86   Pulse 72   Temp (!) 97.3 F (36.3 C)   Wt 216 lb (98 kg)   SpO2 99%   BMI 30.99 kg/m  General Appearance: Well nourished, in no apparent distress.  Eyes: PERRLA, EOMs, conjunctiva no swelling or erythema Sinuses: No Frontal/maxillary tenderness  ENT/Mouth: Ext aud canals clear, normal light reflex with TMs without erythema, bulging.  No erythema, swelling, or exudate on post pharynx. Tonsils not swollen or erythematous. Hearing normal.  Neck: Supple, thyroid subtly enlarged, no nodules. No bruits  Respiratory: Respiratory effort normal, BS equal bilaterally without rales, rhonchi, wheezing or stridor.  Cardio: RRR without murmur, without rubs or gallops. Brisk peripheral pulses with 2+ edema.  Chest: symmetric, with normal excursions and percussion.   Abdomen: Soft, nontender, no guarding, rebound, hernias, masses, or organomegaly.  Lymphatics: Non tender without lymphadenopathy.  Musculoskeletal: Full ROM all peripheral extremities,5/5 strength, and antalgic gait.  Skin: Warm, dry without rashes, lesions, ecchymosis. Neuro: Cranial nerves intact, reflexes equal bilaterally. Normal muscle tone, no cerebellar symptoms. Sensation intact.  Psych: Awake and oriented X 3, normal affect, Insight and Judgment appropriate.   Gorden Harms Bentleigh Stankus 11:26 AM [facility]

## 2020-09-29 ENCOUNTER — Encounter: Payer: Self-pay | Admitting: Adult Health

## 2020-09-29 ENCOUNTER — Other Ambulatory Visit: Payer: Self-pay

## 2020-09-29 ENCOUNTER — Ambulatory Visit (INDEPENDENT_AMBULATORY_CARE_PROVIDER_SITE_OTHER): Payer: Medicare Other | Admitting: Adult Health

## 2020-09-29 VITALS — BP 136/86 | HR 72 | Temp 97.3°F | Wt 216.0 lb

## 2020-09-29 DIAGNOSIS — R7309 Other abnormal glucose: Secondary | ICD-10-CM | POA: Diagnosis not present

## 2020-09-29 DIAGNOSIS — Z9989 Dependence on other enabling machines and devices: Secondary | ICD-10-CM | POA: Diagnosis not present

## 2020-09-29 DIAGNOSIS — E785 Hyperlipidemia, unspecified: Secondary | ICD-10-CM

## 2020-09-29 DIAGNOSIS — I609 Nontraumatic subarachnoid hemorrhage, unspecified: Secondary | ICD-10-CM

## 2020-09-29 DIAGNOSIS — E559 Vitamin D deficiency, unspecified: Secondary | ICD-10-CM

## 2020-09-29 DIAGNOSIS — I10 Essential (primary) hypertension: Secondary | ICD-10-CM

## 2020-09-29 DIAGNOSIS — I5032 Chronic diastolic (congestive) heart failure: Secondary | ICD-10-CM

## 2020-09-29 DIAGNOSIS — G4733 Obstructive sleep apnea (adult) (pediatric): Secondary | ICD-10-CM

## 2020-09-29 DIAGNOSIS — Z79899 Other long term (current) drug therapy: Secondary | ICD-10-CM | POA: Diagnosis not present

## 2020-09-29 DIAGNOSIS — M109 Gout, unspecified: Secondary | ICD-10-CM

## 2020-09-29 DIAGNOSIS — G40909 Epilepsy, unspecified, not intractable, without status epilepticus: Secondary | ICD-10-CM

## 2020-09-29 MED ORDER — ESTROGENS CONJUGATED 0.45 MG PO TABS: ORAL_TABLET | ORAL | 1 refills | Status: DC

## 2020-09-29 NOTE — Patient Instructions (Addendum)
Goals      Blood Pressure < 130/80     Weight (lb) < 206 lb (93.4 kg)         HYPERTENSION INFORMATION  Monitor your blood pressure at home, please keep a record and bring that in with you to your next office visit.   Go to the ER if any CP, SOB, nausea, dizziness, severe HA, changes vision/speech  Testing/Procedures: HOW TO TAKE YOUR BLOOD PRESSURE: Rest 5 minutes before taking your blood pressure. Don't smoke or drink caffeinated beverages for at least 30 minutes before. Take your blood pressure before (not after) you eat. Sit comfortably with your back supported and both feet on the floor (don't cross your legs). Elevate your arm to heart level on a table or a desk. Use the proper sized cuff. It should fit smoothly and snugly around your bare upper arm. There should be enough room to slip a fingertip under the cuff. The bottom edge of the cuff should be 1 inch above the crease of the elbow.  Due to a recent study, SPRINT, we have changed our goal for the systolic or top blood pressure number. Ideally we want your top number at 120.  In the [facility] Trial, 5000 people were randomized to a goal BP of 120 and 5000 people were randomized to a goal BP of less than 140. The patients with the goal BP at 120 had LESS DEMENTIA, LESS HEART ATTACKS, AND LESS STROKES, AS WELL AS OVERALL DECREASED MORTALITY OR DEATH RATE.   There was another study that showed taking your blood pressure medications at night decrease cardiovascular events.  However if you are on a fluid pill, please take this in the morning.   If you are willing, our goal BP is the top number of 120.  Your most recent BP: BP: 140/88   Take your medications faithfully as instructed. Maintain a healthy weight. Get at least 150 minutes of aerobic exercise per week. Minimize salt intake. Minimize alcohol intake  DASH Eating Plan DASH stands for "Dietary Approaches to Stop Hypertension." The DASH eating plan is a healthy eating  plan that has been shown to reduce high blood pressure (hypertension). Additional health benefits may include reducing the risk of type 2 diabetes mellitus, heart disease, and stroke. The DASH eating plan may also help with weight loss. WHAT DO I NEED TO KNOW ABOUT THE DASH EATING PLAN? For the DASH eating plan, you will follow these general guidelines: Choose foods with a percent daily value for sodium of less than 5% (as listed on the food label). Use salt-free seasonings or herbs instead of table salt or sea salt. Check with your health care provider or pharmacist before using salt substitutes. Eat lower-sodium products, often labeled as "lower sodium" or "no salt added." Eat fresh foods. Eat more vegetables, fruits, and low-fat dairy products. Choose whole grains. Look for the word "whole" as the first word in the ingredient list. Choose fish and skinless chicken or [place] more often than red meat. Limit fish, poultry, and meat to 6 oz (170 g) each day. Limit sweets, desserts, sugars, and sugary drinks. Choose heart-healthy fats. Limit cheese to 1 oz (28 g) per day. Eat more home-cooked food and less restaurant, buffet, and fast food. Limit fried foods. Cook foods using methods other than frying. Limit canned vegetables. If you do use them, rinse them well to decrease the sodium. When eating at a restaurant, ask that your food be prepared with less salt, or  no salt if possible. WHAT FOODS CAN I EAT? Seek help from a dietitian for individual calorie needs. Grains Whole grain or whole wheat bread. Brown rice. Whole grain or whole wheat pasta. Quinoa, bulgur, and whole grain cereals. Low-sodium cereals. Corn or whole wheat flour tortillas. Whole grain cornbread. Whole grain crackers. Low-sodium crackers. Vegetables Fresh or frozen vegetables (raw, steamed, roasted, or grilled). Low-sodium or reduced-sodium tomato and vegetable juices. Low-sodium or reduced-sodium tomato sauce and paste.  Low-sodium or reduced-sodium canned vegetables.  Fruits All fresh, canned (in natural juice), or frozen fruits. Meat and Other Protein Products Ground beef (85% or leaner), grass-fed beef, or beef trimmed of fat. Skinless chicken or [place]. Ground chicken or [place]. Pork trimmed of fat. All fish and seafood. Eggs. Dried beans, peas, or lentils. Unsalted nuts and seeds. Unsalted canned beans. Dairy Low-fat dairy products, such as skim or 1% milk, 2% or reduced-fat cheeses, low-fat ricotta or cottage cheese, or plain low-fat yogurt. Low-sodium or reduced-sodium cheeses. Fats and Oils Tub margarines without trans fats. Light or reduced-fat mayonnaise and salad dressings (reduced sodium). Avocado. Safflower, olive, or canola oils. Natural peanut or almond butter. Other Unsalted popcorn and pretzels. The items listed above may not be a complete list of recommended foods or beverages. Contact your dietitian for more options. WHAT FOODS ARE NOT RECOMMENDED? Grains White bread. White pasta. White rice. Refined cornbread. Bagels and croissants. Crackers that contain trans fat. Vegetables Creamed or fried vegetables. Vegetables in a cheese sauce. Regular canned vegetables. Regular canned tomato sauce and paste. Regular tomato and vegetable juices. Fruits Dried fruits. Canned fruit in light or heavy syrup. Fruit juice. Meat and Other Protein Products Fatty cuts of meat. Ribs, chicken wings, bacon, sausage, bologna, salami, chitterlings, fatback, hot dogs, bratwurst, and packaged luncheon meats. Salted nuts and seeds. Canned beans with salt. Dairy Whole or 2% milk, cream, half-and-half, and cream cheese. Whole-fat or sweetened yogurt. Full-fat cheeses or blue cheese. Nondairy creamers and whipped toppings. Processed cheese, cheese spreads, or cheese curds. Condiments Onion and garlic salt, seasoned salt, table salt, and sea salt. Canned and packaged gravies. Worcestershire sauce. Tartar sauce. Barbecue  sauce. Teriyaki sauce. Soy sauce, including reduced sodium. Steak sauce. Fish sauce. Oyster sauce. Cocktail sauce. Horseradish. Ketchup and mustard. Meat flavorings and tenderizers. Bouillon cubes. Hot sauce. Tabasco sauce. Marinades. Taco seasonings. Relishes. Fats and Oils Butter, stick margarine, lard, shortening, ghee, and bacon fat. Coconut, palm kernel, or palm oils. Regular salad dressings. Other Pickles and olives. Salted popcorn and pretzels. The items listed above may not be a complete list of foods and beverages to avoid. Contact your dietitian for more information. WHERE CAN I FIND MORE INFORMATION? National Heart, Lung, and Blood Institute: travelstabloid.com Document Released: 12/29/2010 Document Revised: 05/26/2013 Document Reviewed: 11/13/2012 [facility] Patient Information 2015 [place], [place]. This information is not intended to replace advice given to you by your health care provider. Make sure you discuss any questions you have with your health care provider.

## 2020-09-30 ENCOUNTER — Other Ambulatory Visit: Payer: Self-pay | Admitting: Adult Health

## 2020-09-30 DIAGNOSIS — D631 Anemia in chronic kidney disease: Secondary | ICD-10-CM

## 2020-09-30 DIAGNOSIS — N1832 Chronic kidney disease, stage 3b: Secondary | ICD-10-CM

## 2020-10-01 ENCOUNTER — Other Ambulatory Visit: Payer: Self-pay

## 2020-10-01 LAB — COMPLETE METABOLIC PANEL WITH GFR
AG Ratio: 1.4 (calc) (ref 1.0–2.5)
ALT: 9 U/L (ref 6–29)
AST: 14 U/L (ref 10–35)
Albumin: 4.1 g/dL (ref 3.6–5.1)
Alkaline phosphatase (APISO): 63 U/L (ref 37–153)
BUN/Creatinine Ratio: 16 (calc) (ref 6–22)
BUN: 18 mg/dL (ref 7–25)
CO2: 23 mmol/L (ref 20–32)
Calcium: 9.4 mg/dL (ref 8.6–10.4)
Chloride: 107 mmol/L (ref 98–110)
Creat: 1.13 mg/dL — ABNORMAL HIGH (ref 0.50–1.05)
Globulin: 3 g/dL (calc) (ref 1.9–3.7)
Glucose, Bld: 75 mg/dL (ref 65–99)
Potassium: 5.3 mmol/L (ref 3.5–5.3)
Sodium: 139 mmol/L (ref 135–146)
Total Bilirubin: 0.2 mg/dL (ref 0.2–1.2)
Total Protein: 7.1 g/dL (ref 6.1–8.1)
eGFR: 54 mL/min/{1.73_m2} — ABNORMAL LOW (ref >=60)

## 2020-10-01 LAB — CBC WITH DIFFERENTIAL/PLATELET
Absolute Monocytes: 341 cells/uL (ref 200–950)
Basophils Absolute: 50 cells/uL (ref 0–200)
Basophils Relative: 0.8 %
Eosinophils Absolute: 310 cells/uL (ref 15–500)
Eosinophils Relative: 5 %
HCT: 27.2 % — ABNORMAL LOW (ref 35.0–45.0)
Hemoglobin: 8.9 g/dL — ABNORMAL LOW (ref 11.7–15.5)
Lymphs Abs: 2474 cells/uL (ref 850–3900)
MCH: 27.9 pg (ref 27.0–33.0)
MCHC: 32.7 g/dL (ref 32.0–36.0)
MCV: 85.3 fL (ref 80.0–100.0)
MPV: 10.3 fL (ref 7.5–12.5)
Monocytes Relative: 5.5 %
Neutro Abs: 3026 cells/uL (ref 1500–7800)
Neutrophils Relative %: 48.8 %
Platelets: 284 10*3/uL (ref 140–400)
RBC: 3.19 10*6/uL — ABNORMAL LOW (ref 3.80–5.10)
RDW: 15.2 % — ABNORMAL HIGH (ref 11.0–15.0)
Total Lymphocyte: 39.9 %
WBC: 6.2 10*3/uL (ref 3.8–10.8)

## 2020-10-01 LAB — LIPID PANEL
Cholesterol: 184 mg/dL (ref <200)
HDL: 88 mg/dL (ref >=50)
LDL Cholesterol (Calc): 74 mg/dL (calc)
Non-HDL Cholesterol (Calc): 96 mg/dL (calc) (ref <130)
Total CHOL/HDL Ratio: 2.1 (calc) (ref <5.0)
Triglycerides: 138 mg/dL (ref <150)

## 2020-10-01 LAB — LEVETIRACETAM, IMMUNOASSAY: LEVETIRACETAM, IMMUNOASSAY: 37.7 ug/mL (ref 6.0–46.0)

## 2020-10-01 LAB — MAGNESIUM: Magnesium: 1.5 mg/dL (ref 1.5–2.5)

## 2020-10-01 LAB — TSH: TSH: 2.54 mIU/L (ref 0.40–4.50)

## 2020-10-12 ENCOUNTER — Telehealth: Payer: Self-pay | Admitting: Adult Health

## 2020-10-12 ENCOUNTER — Other Ambulatory Visit: Payer: Self-pay

## 2020-10-12 MED ORDER — TICAGRELOR 90 MG PO TABS: ORAL_TABLET | ORAL | 1 refills | Status: DC

## 2020-10-12 MED ORDER — PANTOPRAZOLE SODIUM 40 MG PO TBEC: DELAYED_RELEASE_TABLET | ORAL | 3 refills | Status: DC

## 2020-10-12 MED ORDER — LEVETIRACETAM 1000 MG PO TABS: 1000.0000 mg | ORAL_TABLET | Freq: Two times a day (BID) | ORAL | 4 refills | Status: DC

## 2020-10-12 MED ORDER — AMLODIPINE BESYLATE 10 MG PO TABS: ORAL_TABLET | ORAL | 1 refills | Status: DC

## 2020-10-12 MED ORDER — ALLOPURINOL 100 MG PO TABS: ORAL_TABLET | ORAL | 3 refills | Status: DC

## 2020-10-12 MED ORDER — ESTROGENS CONJUGATED 0.45 MG PO TABS: ORAL_TABLET | ORAL | 1 refills | Status: DC

## 2020-10-12 MED ORDER — FAMOTIDINE 20 MG PO TABS: ORAL_TABLET | ORAL | 0 refills | Status: DC

## 2020-10-12 NOTE — Telephone Encounter (Signed)
Patient called to advise no longer using [facility] for medications. She is out of all medications, needs new prescriptions sent to [facility].

## 2020-10-22 ENCOUNTER — Other Ambulatory Visit: Payer: Self-pay

## 2020-10-22 MED ORDER — ESTROGENS CONJUGATED 0.45 MG PO TABS: ORAL_TABLET | ORAL | 1 refills | Status: DC

## 2020-10-22 MED ORDER — OLMESARTAN MEDOXOMIL 40 MG PO TABS: ORAL_TABLET | ORAL | 3 refills | Status: DC

## 2020-10-25 ENCOUNTER — Telehealth (HOSPITAL_COMMUNITY): Payer: Self-pay

## 2020-10-25 NOTE — Telephone Encounter (Signed)
Called pt, no answer, left vm. AW  

## 2020-10-26 ENCOUNTER — Other Ambulatory Visit (HOSPITAL_COMMUNITY): Payer: Self-pay | Admitting: Interventional Radiology

## 2020-10-27 ENCOUNTER — Other Ambulatory Visit (HOSPITAL_COMMUNITY): Payer: Self-pay | Admitting: Interventional Radiology

## 2020-10-27 DIAGNOSIS — R519 Headache, unspecified: Secondary | ICD-10-CM

## 2020-10-27 DIAGNOSIS — I671 Cerebral aneurysm, nonruptured: Secondary | ICD-10-CM

## 2020-11-08 ENCOUNTER — Other Ambulatory Visit: Payer: Self-pay

## 2020-11-08 ENCOUNTER — Ambulatory Visit (HOSPITAL_COMMUNITY)
Admission: RE | Admit: 2020-11-08 | Discharge: 2020-11-08 | Disposition: A | Discharge status: Home / Self Care | Payer: Medicare Other | Source: Ambulatory Visit | Attending: Interventional Radiology | Admitting: Interventional Radiology

## 2020-11-08 DIAGNOSIS — R519 Headache, unspecified: Secondary | ICD-10-CM | POA: Diagnosis not present

## 2020-11-08 DIAGNOSIS — I671 Cerebral aneurysm, nonruptured: Secondary | ICD-10-CM

## 2020-11-09 DIAGNOSIS — H1045 Other chronic allergic conjunctivitis: Secondary | ICD-10-CM | POA: Diagnosis not present

## 2020-11-09 DIAGNOSIS — H2513 Age-related nuclear cataract, bilateral: Secondary | ICD-10-CM | POA: Diagnosis not present

## 2020-11-09 DIAGNOSIS — H04123 Dry eye syndrome of bilateral lacrimal glands: Secondary | ICD-10-CM | POA: Diagnosis not present

## 2020-11-15 ENCOUNTER — Telehealth (HOSPITAL_COMMUNITY): Payer: Self-pay

## 2020-11-15 NOTE — Telephone Encounter (Signed)
Pt agreed to f/u in 6 months with mri/mra. AW

## 2020-11-26 DIAGNOSIS — G4733 Obstructive sleep apnea (adult) (pediatric): Secondary | ICD-10-CM | POA: Diagnosis not present

## 2020-11-29 ENCOUNTER — Telehealth: Payer: Self-pay | Admitting: Adult Health

## 2020-11-29 DIAGNOSIS — I5032 Chronic diastolic (congestive) heart failure: Secondary | ICD-10-CM

## 2020-11-29 DIAGNOSIS — I671 Cerebral aneurysm, nonruptured: Secondary | ICD-10-CM

## 2020-11-29 DIAGNOSIS — G4733 Obstructive sleep apnea (adult) (pediatric): Secondary | ICD-10-CM

## 2020-11-29 DIAGNOSIS — I10 Essential (primary) hypertension: Secondary | ICD-10-CM

## 2020-11-29 NOTE — Telephone Encounter (Signed)
patient left voicemail to advise she recieved CPAP machine, but does have supplies, patient requesting all necessary supplies for CPAP machine. Returned patient call, left voicemail for patient to contact provider she follows, Dr. Brett Fairy for CPAP supplies rx to be sent to DME.

## 2020-11-29 NOTE — Addendum Note (Signed)
Addended by: Darleen Crocker on: 11/29/2020 05:35 PM   Modules accepted: Orders

## 2020-11-29 NOTE — Telephone Encounter (Signed)
Pt should call the DME company that she is set up with. Their phone number is [phone]. They will be able to help with supplies. Also pt has not had her initial CPAP follow up visit. This visit must take place from 31-90 days from the date she is set up. I will reach out to [facility] and have them contact the pt. I will have our phone staff reach out about scheduling an initial cpap visit once I find out her date of set up

## 2020-11-29 NOTE — Addendum Note (Signed)
Addended by: Larey Seat on: 11/29/2020 05:08 PM   Modules accepted: Orders

## 2021-01-04 ENCOUNTER — Ambulatory Visit (INDEPENDENT_AMBULATORY_CARE_PROVIDER_SITE_OTHER): Payer: Medicare Other | Admitting: Internal Medicine

## 2021-01-04 ENCOUNTER — Encounter: Payer: Self-pay | Admitting: Internal Medicine

## 2021-01-04 ENCOUNTER — Other Ambulatory Visit: Payer: Self-pay

## 2021-01-04 VITALS — BP 180/90 | HR 75 | Temp 98.0°F | Resp 18 | Ht 70.0 in | Wt 225.2 lb

## 2021-01-04 DIAGNOSIS — G4483 Primary cough headache: Secondary | ICD-10-CM

## 2021-01-04 DIAGNOSIS — J329 Chronic sinusitis, unspecified: Secondary | ICD-10-CM | POA: Diagnosis not present

## 2021-01-04 DIAGNOSIS — J40 Bronchitis, not specified as acute or chronic: Secondary | ICD-10-CM | POA: Diagnosis not present

## 2021-01-04 LAB — POC INFLUENZA A&B (BINAX/QUICKVUE)
Influenza A, POC: NEGATIVE
Influenza B, POC: NEGATIVE

## 2021-01-04 LAB — POC COVID19 BINAXNOW: SARS Coronavirus 2 Ag: NEGATIVE

## 2021-01-04 MED ORDER — PROMETHAZINE-DM 6.25-15 MG/5ML PO SYRP: ORAL_SOLUTION | ORAL | 1 refills | Status: DC

## 2021-01-04 MED ORDER — AZITHROMYCIN 250 MG PO TABS: ORAL_TABLET | ORAL | 1 refills | Status: DC

## 2021-01-04 MED ORDER — BENZONATATE 200 MG PO CAPS: ORAL_CAPSULE | ORAL | 1 refills | Status: DC

## 2021-01-04 MED ORDER — DEXAMETHASONE 4 MG PO TABS: ORAL_TABLET | ORAL | 0 refills | Status: DC

## 2021-01-04 NOTE — Progress Notes (Signed)
Future Appointments  Date Time Provider Department  01/04/2021  3:30 PM Unk Pinto, MD [facility]  01/26/2021 10:00 AM Liane Comber, NP [facility]    History of Present Illness:     Patient is a very nice 65 yo BF presenting with a 5 day prodrome of HA, productive cough, Sinus pressure & congestion, runny nose . Denies fever, chills, sweats , dyspnea .  Covid & Flu tests - Negative.   Medications  Current Outpatient Medications (Endocrine & Metabolic):    estrogens, conjugated, (PREMARIN) 0.45 MG tablet, Take 1 tablet daily for hormones  Current Outpatient Medications (Cardiovascular):    amLODipine (NORVASC) 10 MG tablet, TAKE 1 TABLET BY MOUTH DAILY FOR BLOOD PRESSURE   furosemide (LASIX) 40 MG tablet, Take 1 tablet Daily for BP, Fluid Retention & Ankle Swelling   olmesartan (BENICAR) 40 MG tablet, Take one tablet daily for Blood Pressure.  Current Outpatient Medications (Respiratory):    fexofenadine (ALLEGRA ALLERGY) 180 MG tablet, Take 1 tablet (180 mg total) by mouth daily.  Current Outpatient Medications (Analgesics):    allopurinol (ZYLOPRIM) 100 MG tablet, Take  1 tablet  Daily  to Prevent Gout   aspirin EC 81 MG tablet, Take 1 tablet (81 mg total) by mouth daily.  Current Outpatient Medications (Hematological):    ticagrelor (BRILINTA) 90 MG TABS tablet, Take 1 tablet  2 x /day (every 12 hours )  to prevent Blood Clots   vitamin B-12 (CYANOCOBALAMIN) 500 MCG tablet, Take 1,000 mcg by mouth daily.  Current Outpatient Medications (Other):    cholecalciferol (VITAMIN D3) 25 MCG (1000 UT) tablet, Take 1,000 Units by mouth daily.   famotidine (PEPCID) 20 MG tablet, TAKE 1 TABLET BY MOUTH EVERY MORNING 30-60 MINUTES PRIOR TO BREAKFAST FOR REFLUX   levETIRAcetam (KEPPRA) 1000 MG tablet, Take 1 tablet (1,000 mg total) by mouth 2 (two) times daily.   Multiple Vitamins-Minerals (MULTIVITAMIN WITH MINERALS) tablet, Take 1 tablet by mouth daily.   pantoprazole  (PROTONIX) 40 MG tablet, Take one tablet at bedtime  Problem list She has Essential hypertension; Mitral regurgitation; Chronic diastolic heart failure ([place]); OSA on CPAP; Vitamin D deficiency; Hyperlipidemia; Brain aneurysm; Abnormal glucose; Medication management; Morbid obesity ([place]) - BMI 30+ with OSA; Gout; Gastroesophageal reflux disease; Anemia of chronic disease- renal; Allergic rhinitis; Persistent cough; CKD stage G3b/A2, GFR 30-44 and albumin creatinine ratio 30-299 mg/g ([place]); History of anemia due to chronic kidney disease; Nocturia more than twice per night; Chronic spontaneous subarach intracran bleed due to cerebral aneurysm ([place]); Seizure disorder ([place]); and Severe obstructive sleep apnea-hypopnea syndrome on their problem list.   Observations/Objective:  BP (!) 180/90    Pulse 75    Temp 98 F (36.7 C)    Resp 18    Ht 5\' 10"  (1.778 m)    Wt 225 lb 3.2 oz (102.2 kg)    SpO2 99%    BMI 32.31 kg/m    Congested cough. No Stridor. No rash /cyanosis.  HEENT - EACs /TMs - Nl . O/P - clear. (+) tender  fronto-maxillary areas.  Neck - supple.  Chest -  Few scattered rales & rhonchi not totally clearing with cough. No wheezes.  Cor - Nl HS. RRR w/o sig MGR. PP 1(+). No edema. MS- FROM w/o deformities.  Gait Nl. Neuro -  Nl w/o focal abnormalities.  Assessment and Plan:  1. Sinobronchitis  - dexamethasone 4 MG tablet;  Take 1 tab 3 x day - 3 days, then 2 x  day - 3 days, then 1 tab daily   Dispense: 20 tablet  - azithromycin 250 MG tablet;  Take 2 tablets with Food on  Day 1, then 1 tablet Daily  Dispense: 6 each; Refill: 1  - benzonatate 200 MG capsule; Take 1 perle 3 x / day to prevent cough   Dispense: 30 capsule; Refill: 1  - promethazine-DM) 6.25-15 MG/5ML syrup;  Take 1 tsp every 4 hours if needed for cough   Dispense: 240 mL; Refill: 1  2. Cough headache  - POC COVID-19 - POC Influenza A&B (Binax test)   Follow Up Instructions:       I discussed the  assessment and treatment plan with the patient. The patient was provided an opportunity to ask questions and all were answered. The patient agreed with the plan and demonstrated an understanding of the instructions.       The patient was advised to call back or seek an in-person evaluation if the symptoms worsen or if the condition fails to improve as anticipated.    Kirtland Bouchard, MD

## 2021-01-11 ENCOUNTER — Telehealth: Payer: Self-pay | Admitting: Neurology

## 2021-01-11 NOTE — Telephone Encounter (Signed)
At 2:04 pt left vm stating that she received her CPAP but there is no water chamber, no tubing and the cord from the previous CPAP doesn't fit this new one.  Pt is asking for a call to discuss.

## 2021-01-11 NOTE — Telephone Encounter (Signed)
Called pt back. She received new cpap (previous one recalled) 2 months ago. Was given [employer]. States its a [employer]. No water chamber. Tried reaching out to [employer] to get supplies but no one contacted her back. She has not been using cpap for the last 2 months. Aware I will reach out to manager w/ [employer], Jeneen Rinks to get update. I will have them f/u with her.   I called and spoke with Jeneen Rinks. He thinks company sent her replacement machine since old one recalled but they will need to get her set up with new supplies. He took pt name/DOB and will f/u with her on this and makes sure she gets started/gets supplies.   I called pt back. Scheduled follow up for 03/17/21 at 8:30am w/ Dr. Brett Fairy. Pt will call if she has any further issues. Aware [employer] will be reaching out to her.

## 2021-01-15 ENCOUNTER — Other Ambulatory Visit: Payer: Self-pay | Admitting: Adult Health

## 2021-01-21 ENCOUNTER — Other Ambulatory Visit: Payer: Self-pay | Admitting: Adult Health

## 2021-01-22 DIAGNOSIS — G4733 Obstructive sleep apnea (adult) (pediatric): Secondary | ICD-10-CM | POA: Diagnosis not present

## 2021-01-25 NOTE — Progress Notes (Signed)
Complete Physical  Assessment and Plan:  Encounter for Annual Physical Exam with abnormal findings Due annually  Health Maintenance reviewed Healthy lifestyle reviewed and goals set  Chronic diastolic heart failure Emphasized need for weight loss  Emphasized salt restriction, less than 2000mg  a day. Encouraged daily monitoring of the patient's weight, call office if 3 lb weight loss or gain in a day.  Encouraged regular exercise.  decrease your fluid intake to less than 2 L daily  Cardiology is managing; appears euvolemic today    Mitral regurgitation Monitored by cardio, moderate per recent ECHO/MRI, no SOB/CP  Essential hypertension - continue medications, DASH diet, exercise and monitor at home. Call if greater than 130/80.  - CBC with Differential/Platelet - CMP/GFR - TSH - Urinalysis, Routine w reflex microscopic (not at [facility]) - Microalbumin / creatinine urine ratio  Abnormal glucose Discussed general issues about diabetes pathophysiology and management., Educational material distributed., Suggested low cholesterol diet., Encouraged aerobic exercise., Discussed foot care., Reminded to get yearly retinal exam. - Hemoglobin A1c  Hyperlipidemia -continue medications, check lipids, decrease fatty foods, increase activity. - Lipid panel  Vitamin D deficiency - Vit D  25 hydroxy (rtn osteoporosis monitoring)  Medication management - Magnesium  OSA on CPAP New machine, pending supplies to restart Dr. Brett Fairy following Weight loss encouraged   Brain aneurysm Dr. Estanislado Pandy released S/p coil, on brilinta Control blood pressure, cholesterol, glucose, increase exercise.    Seizures ([place]) R/t CVA/aneurysm; Dr. Estanislado Pandy released, on keppra, check levels here  - levetiracetam (keppra) level  Gout of foot, unspecified cause, unspecified chronicity, unspecified laterality - Gout- recheck Uric acid as needed, Diet discussed, continue medications.  Gastroesophageal  reflux disease, esophagitis presence not specified Well managed on current medications Discussed diet, avoiding triggers and other lifestyle changes  Morbid obesity ([place]) - BMI 30+ with OSA - long discussion about weight loss, diet, and exercise -recommended diet heavy in fruits and veggies and low in animal meats, cheeses, and dairy products  Anemia Likely chronic renal, pending nephrology evaluation  Persistent cough Cough- multifactorial-   Continue PPI , take consistently,  Restart CPAP If not improved follow up Consider ENT evaluation for laryngoscopy  Also possible mild dysphagia leading to daytime cough with drinking/eating - consider speech pathology referral if cough not improving  Prolonged QT New finding, follow up with established cardiology Dr. Acie Fredrickson Avoid QT prolonging meds  Need for influenza vaccine High dose quadrivalent administered without complication today   Need for pneumonia vaccination - 20 valent pneumococcal vaccine administered without complication today    Orders Placed This Encounter  Procedures   DG Bone Density   Pneumococcal conjugate vaccine 20-valent (Prevnar 20)   Flu vaccine HIGH DOSE PF (Fluzone High dose)   CBC with Differential/Platelet   COMPLETE METABOLIC PANEL WITH GFR   Magnesium   Lipid panel   TSH   Hemoglobin A1c   VITAMIN D 25 Hydroxy (Vit-D Deficiency, Fractures)   Uric acid   Vitamin B12   Iron, TIBC and Ferritin Panel   Microalbumin / creatinine urine ratio   Urinalysis, Routine w reflex microscopic   Levetiracetam, Immunoassay   EKG 12-Lead   Discussed med's effects and SE's. Screening labs and tests as requested with regular follow-up as recommended. Over 40 minutes of exam, counseling, chart review, and complex, high level critical decision making was performed this visit.  Future Appointments  Date Time Provider [place]  03/17/2021  8:30 AM Dohmeier, Asencion Partridge, MD [facility] None  06/01/2021  9:30 AM  Liane Comber, NP [facility] None  09/07/2021  9:30 AM Unk Pinto, MD [facility] None  01/26/2022 10:00 AM Liane Comber, NP [facility] None    HPI  66 y.o. female  presents for a complete physical. She has Essential hypertension; Mitral regurgitation; Chronic diastolic heart failure ([place]); OSA on CPAP; Vitamin D deficiency; Hyperlipidemia; Brain aneurysm; Abnormal glucose; Medication management; Morbid obesity ([place]) - BMI 30+ with OSA; Gout; Gastroesophageal reflux disease; Anemia of chronic disease- renal; Allergic rhinitis; Persistent cough; CKD stage G3b/A2, GFR 30-44 and albumin creatinine ratio 30-299 mg/g ([place]); History of anemia due to chronic kidney disease; Nocturia more than twice per night; Chronic spontaneous subarach intracran bleed due to cerebral aneurysm ([place]); Seizure disorder ([place]); Severe obstructive sleep apnea-hypopnea syndrome; and Prolonged Q-T interval on ECG on their problem list.  She reports husband passed in Dec 04 2020 after hospice, she reports is doing fairly. Family supports, 2 boys, 4 granddaughters, 1 great grandson.   She works at [employer], going to work part time- 4 hours 2-3 days a week.   She was admitted 01/2017 for left ICA aneursym for a pipeline stent but had a seizure and AMS after her procedure, she is on brilinta, she was following with Dr. Corena Pilgrim but was released as no seizures since that time, continues on keppra 1000 mg BID. Mas mild residual R arm weakness and some spasticity. Had follow up MRI 04/30/2020 which showed essentially unchanged, stable chronic microvascular ischemia.  She was on CPAP since 2013, had at [facility], was doing well but recently CPAP machine was recalled, needing replacement. She was referred to Dr. Brett Fairy, recent sleep study 04/2020 showed severe OSA with REM hypoxia, AHI was 41.8/h, recommended CPAP at 16 cm. CPAP finally arrived, pending supplies.   She has reported persistent dry cough,  triggered by inhalation, drinking water or any food intake, but notably worse at night. She reports coughing with drinking or food is ongoing since her aneurysm/stroke/seizure, but has noted worse coughing at night. Was started on famotidine and PPI, reduced caffeine with some improvement.   She is on estrogen pills for hot flashes, had been taking premarin 0.9 mg for over 10 years, last visit reduced to 0.45 mg, reports didn't pick up due to $190 cost on ? Donut hole.   BMI is Body mass index is 31.57 kg/m., she has been working on diet and exercise. Weight up after recent steroid taper.  Wt Readings from Last 3 Encounters:  01/26/21 220 lb (99.8 kg)  01/04/21 225 lb 3.2 oz (102.2 kg)  09/29/20 216 lb (98 kg)   PVD and CHF MR, follows with Dr. Sondra Come.  Had ECHO 07/2019 showing normal LV function, Mod MR, mass in the RA, but follow up cardiac MRI on 10/02/2019 showed no evidence of atrial mass.   Her blood pressure has been controlled at home, states has been good at home, today their BP is BP: 132/88 She does workout, walks daily for 30 mins. She denies chest pain, shortness of breath, dizziness.   She is not on cholesterol medication and denies myalgias. Her cholesterol is at goal. The cholesterol last visit was:   Lab Results  Component Value Date   CHOL 184 09/29/2020   HDL 88 09/29/2020   LDLCALC 74 09/29/2020   TRIG 138 09/29/2020   CHOLHDL 2.1 09/29/2020   She has been working on diet and exercise for glucose management, and denies paresthesia of the feet, polydipsia, polyuria and visual disturbances. Last A1C in  the office was:  Lab Results  Component Value Date   HGBA1C 5.5 01/26/2020    Patient is on allopurinol 100 mg daily for gout, does not report a recent flare.  Lab Results  Component Value Date   LABURIC 4.9 06/17/2020   Patient is on Vitamin D supplement, taking 1 caps of unsure dose ? 5000 IU Lab Results  Component Value Date   VD25OH 34 06/17/2020     Lab  Results  Component Value Date   CREATININE 1.13 (H) 09/29/2020   CREATININE 1.17 (H) 06/17/2020   CREATININE 1.40 (H) 03/30/2020   Lab Results  Component Value Date   GFRNONAA 74 (L) 06/17/2020   GFRNONAA 40 (L) 03/30/2020   GFRNONAA 43 (L) 03/21/2020      She has persistent anemia, was recently referred to evaluation by hematology with unremarkable workup in 2022, biopsy/aspiration were nonspecific and felt likely anemia of chronic disease secondary renal (has upcoming appointment with nephrology). Some iron def as well and completed 2-3 months of ferrous sulphate.  Dr. Earlie Server follows.  CBC Latest Ref Rng & Units 09/29/2020 06/17/2020 03/30/2020  WBC 3.8 - 10.8 Thousand/uL 6.2 6.8 8.3  Hemoglobin 11.7 - 15.5 g/dL 8.9(L) 9.6(L) 9.5(L)  Hematocrit 35.0 - 45.0 % 27.2(L) 31.1(L) 30.1(L)  Platelets 140 - 400 Thousand/uL 284 317 303   Lab Results  Component Value Date   IRON 50 12/04/2019   TIBC 330 12/04/2019   FERRITIN 58 12/04/2019     Lab Results  Component Value Date   VITAMINB12 >2,000 (H) 10/27/2019     Current Medications:   Current Outpatient Medications (Endocrine & Metabolic):    estrogens, conjugated, (PREMARIN) 0.45 MG tablet, Take 1 tablet daily for hormones  Current Outpatient Medications (Cardiovascular):    amLODipine (NORVASC) 10 MG tablet, TAKE 1 TABLET BY MOUTH DAILY FOR BLOOD PRESSURE   olmesartan (BENICAR) 40 MG tablet, Take one tablet daily for Blood Pressure.   furosemide (LASIX) 40 MG tablet, Take 1 tablet Daily for BP, Fluid Retention & Ankle Swelling  Current Outpatient Medications (Respiratory):    benzonatate (TESSALON) 200 MG capsule, Take 1 perle 3 x / day to prevent cough   fexofenadine (ALLEGRA ALLERGY) 180 MG tablet, Take 1 tablet (180 mg total) by mouth daily.  Current Outpatient Medications (Analgesics):    allopurinol (ZYLOPRIM) 100 MG tablet, Take  1 tablet  Daily  to Prevent Gout   aspirin EC 81 MG tablet, Take 1 tablet (81 mg total)  by mouth daily.  Current Outpatient Medications (Hematological):    ticagrelor (BRILINTA) 90 MG TABS tablet, Take 1 tablet  2 x /day (every 12 hours )  to prevent Blood Clots   vitamin B-12 (CYANOCOBALAMIN) 500 MCG tablet, Take 1,000 mcg by mouth daily.  Current Outpatient Medications (Other):    cholecalciferol (VITAMIN D3) 25 MCG (1000 UT) tablet, Take 1,000 Units by mouth daily.   famotidine (PEPCID) 20 MG tablet, TAKE 1 TABLET BY MOUTH EVERY MORNING 30 TO 60 MINUTES BEFORE BREAKFAST FOR REFLUX   Multiple Vitamins-Minerals (MULTIVITAMIN WITH MINERALS) tablet, Take 1 tablet by mouth daily.   pantoprazole (PROTONIX) 40 MG tablet, Take one tablet at bedtime   levETIRAcetam (KEPPRA) 1000 MG tablet, Take 1 tablet (1,000 mg total) by mouth 2 (two) times daily.  Health Maintenance:   Immunization History  Administered Date(s) Administered   Influenza Inj Mdck Quad With Preservative 10/30/2018   Influenza Split 11/11/2013, 10/23/2014   Influenza, High Dose Seasonal PF 01/26/2021   Influenza,  Seasonal, Injecte, Preservative Fre 10/26/2015   Influenza,inj,Quad PF,6+ Mos 02/23/2017   Influenza-Unspecified 11/25/2012   Moderna SARS-COV2 Booster Vaccination 03/06/2020   Moderna Sars-Covid-2 Vaccination 02/13/2019, 03/13/2019   PNEUMOCOCCAL CONJUGATE-20 01/26/2021   Tdap 08/25/2013   Tetanus: 2015 Prevnar 20: TODAY  Flu vaccine: TODAY Shingrix:  Covid 19: 2/2, 2021, moderna, + booster 02/2020  Pap: TAH, DONE  MGM: 05/07/2020, [facility] DEXA: DUE - order   Colonoscopy: 10/2018 Dr. Collene Mares, 5 year recall, hx of polyps EGD: 10/2018 Dr. Collene Mares  Last Dental Exam: Has partial/bridges, no dentist.  Last Eye Exam: Dr. Efrain Sella, 2022, wears glasses, mild cataract   Patient Care Team: Liane Comber, NP as PCP - General (Nurse Practitioner) Unk Pinto, MD as PCP - Internal Medicine (Internal Medicine) Freada Bergeron, MD as PCP - Cardiology (Cardiology) Sharmon Revere as  Physician Assistant (Physician Assistant) Juanita Craver, MD as Consulting Physician (Gastroenterology) Mcarthur Rossetti, MD as Consulting Physician (Orthopedic Surgery) Unk Pinto, MD as Consulting Physician (Internal Medicine) Luanne Bras, MD as Consulting Physician (Interventional Radiology) Unk Pinto, MD as Referring Physician (Internal Medicine) Dohmeier, Asencion Partridge, MD as Consulting Physician (Neurology)  Medical History:  Past Medical History:  Diagnosis Date   Anemia    Arthritis    Chronic diastolic heart failure ([place])    GERD (gastroesophageal reflux disease)    Heart murmur    Hyperlipidemia    Hypertension may, 1973   Mitral regurgitation    a. Echo 01/2012 mild LVH, EF 55-65%, Gr 2 DD, mod MR, mild LAE, PASP 36;  b. Echo (02/2013):  EF 55-60%, Gr 1 DD, mild to mod MR, mild LAE (no sig change since prior echo) // c. Echo 10/18: EF 55-60, normal wall motion, grade 2 diastolic dysfunction, trivial AI, mild to moderate MR, mild LAE, trivial PI, PASP 38     OSA on CPAP    7 yrs   Seizures ([place]) 01/2017   Stroke ([place]) 07   no weakness jan 29th 2019 no defecits   Subarachnoid hemorrhage due to ruptured aneurysm ([place])    2003 - s/p coiling   Unspecified vitamin D deficiency    Allergies Allergies  Allergen Reactions   Promethazine-Dm     Palpitations   Ace Inhibitors Nausea And Vomiting   Aspirin Nausea And Vomiting    Can take coated Aspirin   SURGICAL HISTORY She  has a past surgical history that includes Aneurysm coiling; head surgery; Tonsillectomy and adenoidectomy; Radiology with anesthesia (N/A, 04/23/2013); IR 3D Independent Wkst (09/04/2016); IR Angiogram Extremity Left (09/04/2016); IR ANGIO INTRA EXTRACRAN SEL COM CAROTID INNOMINATE BILAT MOD SED (09/04/2016); IR ANGIO VERTEBRAL SEL VERTEBRAL BILAT MOD SED (09/04/2016); IR Radiologist Eval & Mgmt (10/11/2016); Radiology with anesthesia (N/A, 10/30/2016); Tubal ligation; Radiology with anesthesia (N/A,  02/21/2017); IR Transcath/Emboliz (02/21/2017); IR ANGIO INTRA EXTRACRAN SEL INTERNAL CAROTID UNI L MOD SED (02/21/2017); IR NEURO EACH ADD'L AFTER BASIC UNI LEFT (MS) (02/21/2017); IR Angiogram Follow Up Study (02/21/2017); IR Radiologist Eval & Mgmt (03/07/2017); Abdominal hysterectomy; Esophagogastroduodenoscopy (egd) with propofol (N/A, 09/05/2018); Colonoscopy with propofol (N/A, 09/05/2018); and Givens capsule study (N/A, 10/17/2018).   FAMILY HISTORY Her family history includes Cancer (age of onset: 41) in her sister; Hypertension in her father, sister, sister, and sister; Kidney disease in her father.   SOCIAL HISTORY She  reports that she quit smoking about 22 years ago. Her smoking use included cigarettes. She has a 0.75 pack-year smoking history. She has never used smokeless tobacco. She reports that she does  not drink alcohol and does not use drugs.  Review of Systems: Review of Systems  Constitutional:  Negative for malaise/fatigue and weight loss.  HENT:  Negative for hearing loss and tinnitus.   Eyes:  Negative for blurred vision and double vision.  Respiratory:  Positive for cough. Negative for sputum production, shortness of breath and wheezing.   Cardiovascular:  Negative for chest pain, palpitations, orthopnea, claudication and leg swelling.  Gastrointestinal:  Negative for abdominal pain, blood in stool, constipation, diarrhea, heartburn, melena, nausea and vomiting.  Genitourinary: Negative.   Musculoskeletal:  Negative for joint pain and myalgias.  Skin:  Negative for rash.  Neurological:  Negative for dizziness, tingling, sensory change, weakness and headaches.  Endo/Heme/Allergies:  Negative for polydipsia.  Psychiatric/Behavioral: Negative.    All other systems reviewed and are negative.  Physical Exam: Estimated body mass index is 31.57 kg/m as calculated from the following:   Height as of this encounter: 5\' 10"  (1.778 m).   Weight as of this encounter: 220 lb (99.8  kg). BP 132/88    Pulse 95    Temp 97.7 F (36.5 C)    Ht 5\' 10"  (1.778 m)    Wt 220 lb (99.8 kg)    SpO2 99%    BMI 31.57 kg/m  General Appearance: Well nourished, in no apparent distress.  Eyes: PERRLA, EOMs, conjunctiva no swelling or erythema Sinuses: No Frontal/maxillary tenderness  ENT/Mouth: Ext aud canals clear, normal light reflex with TMs without erythema, bulging.  No erythema, swelling, or exudate on post pharynx. Tonsils not swollen or erythematous. Hearing normal.  Neck: Supple, thyroid subtly enlarged, no nodules. No bruits  Respiratory: Respiratory effort normal, BS equal bilaterally without rales, rhonchi, wheezing or stridor.  Cardio: RRR with holosystolic murmur LSB, without rubs or gallops. Brisk peripheral pulses with 2+ edema.  Chest: symmetric, with normal excursions and percussion.  Breasts: Defer, getting annual mammogram, no concerns Abdomen: Soft, nontender, no guarding, rebound, hernias, masses, or organomegaly.  Lymphatics: Non tender without lymphadenopathy.  Genitourinary: defer Musculoskeletal: Full ROM all peripheral extremities,5/5 strength, and antalgic gait.  Skin: Warm, dry without rashes, lesions, ecchymosis. Neuro: Cranial nerves intact, reflexes equal bilaterally. Normal muscle tone, no cerebellar symptoms. Sensation intact.  Psych: Awake and oriented X 3, normal affect, Insight and Judgment appropriate.   EKG: WNL, prolonged QT 586 ms   Gorden Harms Shalom Mcguiness 1:03 PM [facility]

## 2021-01-26 ENCOUNTER — Encounter: Payer: Self-pay | Admitting: Adult Health

## 2021-01-26 ENCOUNTER — Ambulatory Visit (INDEPENDENT_AMBULATORY_CARE_PROVIDER_SITE_OTHER): Payer: Medicare Other | Admitting: Adult Health

## 2021-01-26 ENCOUNTER — Other Ambulatory Visit: Payer: Self-pay

## 2021-01-26 VITALS — BP 132/88 | HR 95 | Temp 97.7°F | Ht 70.0 in | Wt 220.0 lb

## 2021-01-26 DIAGNOSIS — D509 Iron deficiency anemia, unspecified: Secondary | ICD-10-CM | POA: Diagnosis not present

## 2021-01-26 DIAGNOSIS — Z0000 Encounter for general adult medical examination without abnormal findings: Secondary | ICD-10-CM | POA: Diagnosis not present

## 2021-01-26 DIAGNOSIS — Z862 Personal history of diseases of the blood and blood-forming organs and certain disorders involving the immune mechanism: Secondary | ICD-10-CM

## 2021-01-26 DIAGNOSIS — G4733 Obstructive sleep apnea (adult) (pediatric): Secondary | ICD-10-CM

## 2021-01-26 DIAGNOSIS — I10 Essential (primary) hypertension: Secondary | ICD-10-CM

## 2021-01-26 DIAGNOSIS — K219 Gastro-esophageal reflux disease without esophagitis: Secondary | ICD-10-CM

## 2021-01-26 DIAGNOSIS — R9431 Abnormal electrocardiogram [ECG] [EKG]: Secondary | ICD-10-CM | POA: Insufficient documentation

## 2021-01-26 DIAGNOSIS — E559 Vitamin D deficiency, unspecified: Secondary | ICD-10-CM

## 2021-01-26 DIAGNOSIS — E2839 Other primary ovarian failure: Secondary | ICD-10-CM

## 2021-01-26 DIAGNOSIS — Z136 Encounter for screening for cardiovascular disorders: Secondary | ICD-10-CM

## 2021-01-26 DIAGNOSIS — Z79899 Other long term (current) drug therapy: Secondary | ICD-10-CM

## 2021-01-26 DIAGNOSIS — Z0001 Encounter for general adult medical examination with abnormal findings: Secondary | ICD-10-CM

## 2021-01-26 DIAGNOSIS — Z23 Encounter for immunization: Secondary | ICD-10-CM

## 2021-01-26 DIAGNOSIS — E785 Hyperlipidemia, unspecified: Secondary | ICD-10-CM

## 2021-01-26 DIAGNOSIS — N1832 Chronic kidney disease, stage 3b: Secondary | ICD-10-CM

## 2021-01-26 DIAGNOSIS — I5032 Chronic diastolic (congestive) heart failure: Secondary | ICD-10-CM

## 2021-01-26 DIAGNOSIS — I671 Cerebral aneurysm, nonruptured: Secondary | ICD-10-CM

## 2021-01-26 DIAGNOSIS — Z131 Encounter for screening for diabetes mellitus: Secondary | ICD-10-CM

## 2021-01-26 DIAGNOSIS — E538 Deficiency of other specified B group vitamins: Secondary | ICD-10-CM | POA: Diagnosis not present

## 2021-01-26 DIAGNOSIS — M109 Gout, unspecified: Secondary | ICD-10-CM

## 2021-01-26 DIAGNOSIS — I609 Nontraumatic subarachnoid hemorrhage, unspecified: Secondary | ICD-10-CM

## 2021-01-26 DIAGNOSIS — N189 Chronic kidney disease, unspecified: Secondary | ICD-10-CM

## 2021-01-26 DIAGNOSIS — D638 Anemia in other chronic diseases classified elsewhere: Secondary | ICD-10-CM

## 2021-01-26 DIAGNOSIS — I340 Nonrheumatic mitral (valve) insufficiency: Secondary | ICD-10-CM | POA: Diagnosis not present

## 2021-01-26 DIAGNOSIS — G40909 Epilepsy, unspecified, not intractable, without status epilepticus: Secondary | ICD-10-CM

## 2021-01-26 DIAGNOSIS — R7309 Other abnormal glucose: Secondary | ICD-10-CM

## 2021-01-26 MED ORDER — LEVETIRACETAM 1000 MG PO TABS: 1000.0000 mg | ORAL_TABLET | Freq: Two times a day (BID) | ORAL | 4 refills | Status: DC

## 2021-01-26 MED ORDER — ESTROGENS CONJUGATED 0.45 MG PO TABS: ORAL_TABLET | ORAL | 1 refills | Status: DC

## 2021-01-27 ENCOUNTER — Other Ambulatory Visit: Payer: Self-pay

## 2021-01-27 MED ORDER — VITAMIN B-12 500 MCG PO TABS: ORAL_TABLET | ORAL | 0 refills | Status: DC

## 2021-01-27 NOTE — Progress Notes (Signed)
Pt is aware of results and recommendations, dit not have any questions for the provider or nurse. I also made sure to emphasize the importance of calling the kidney specialist ASAP, per Ashley's note.

## 2021-01-28 LAB — COMPLETE METABOLIC PANEL WITH GFR
AG Ratio: 1.3 (calc) (ref 1.0–2.5)
ALT: 16 U/L (ref 6–29)
AST: 22 U/L (ref 10–35)
Albumin: 4.4 g/dL (ref 3.6–5.1)
Alkaline phosphatase (APISO): 82 U/L (ref 37–153)
BUN/Creatinine Ratio: 12 (calc) (ref 6–22)
BUN: 16 mg/dL (ref 7–25)
CO2: 27 mmol/L (ref 20–32)
Calcium: 10 mg/dL (ref 8.6–10.4)
Chloride: 105 mmol/L (ref 98–110)
Creat: 1.34 mg/dL — ABNORMAL HIGH (ref 0.50–1.05)
Globulin: 3.4 g/dL (calc) (ref 1.9–3.7)
Glucose, Bld: 86 mg/dL (ref 65–99)
Potassium: 4.5 mmol/L (ref 3.5–5.3)
Sodium: 140 mmol/L (ref 135–146)
Total Bilirubin: 0.3 mg/dL (ref 0.2–1.2)
Total Protein: 7.8 g/dL (ref 6.1–8.1)
eGFR: 44 mL/min/{1.73_m2} — ABNORMAL LOW (ref >=60)

## 2021-01-28 LAB — MICROALBUMIN / CREATININE URINE RATIO
Creatinine, Urine: 53 mg/dL (ref 20–275)
Microalb Creat Ratio: 285 mcg/mg creat — ABNORMAL HIGH (ref <30)
Microalb, Ur: 15.1 mg/dL

## 2021-01-28 LAB — IRON,TIBC AND FERRITIN PANEL
%SAT: 11 % (calc) — ABNORMAL LOW (ref 16–45)
Ferritin: 20 ng/mL (ref 16–288)
Iron: 46 ug/dL (ref 45–160)
TIBC: 429 mcg/dL (calc) (ref 250–450)

## 2021-01-28 LAB — URINALYSIS, ROUTINE W REFLEX MICROSCOPIC
Bilirubin Urine: NEGATIVE
Crystals: NONE SEEN /HPF
Glucose, UA: NEGATIVE
Hgb urine dipstick: NEGATIVE
Hyaline Cast: NONE SEEN /LPF
Ketones, ur: NEGATIVE
Nitrite: NEGATIVE
Specific Gravity, Urine: 1.009 (ref 1.001–1.035)
pH: 6 (ref 5.0–8.0)

## 2021-01-28 LAB — LIPID PANEL
Cholesterol: 255 mg/dL — ABNORMAL HIGH (ref <200)
HDL: 98 mg/dL (ref >=50)
LDL Cholesterol (Calc): 130 mg/dL (calc) — ABNORMAL HIGH
Non-HDL Cholesterol (Calc): 157 mg/dL (calc) — ABNORMAL HIGH (ref <130)
Total CHOL/HDL Ratio: 2.6 (calc) (ref <5.0)
Triglycerides: 159 mg/dL — ABNORMAL HIGH (ref <150)

## 2021-01-28 LAB — CBC WITH DIFFERENTIAL/PLATELET
Absolute Monocytes: 393 cells/uL (ref 200–950)
Basophils Absolute: 46 cells/uL (ref 0–200)
Basophils Relative: 0.6 %
Eosinophils Absolute: 501 cells/uL — ABNORMAL HIGH (ref 15–500)
Eosinophils Relative: 6.5 %
HCT: 30.5 % — ABNORMAL LOW (ref 35.0–45.0)
Hemoglobin: 9.8 g/dL — ABNORMAL LOW (ref 11.7–15.5)
Lymphs Abs: 3403 cells/uL (ref 850–3900)
MCH: 26.6 pg — ABNORMAL LOW (ref 27.0–33.0)
MCHC: 32.1 g/dL (ref 32.0–36.0)
MCV: 82.9 fL (ref 80.0–100.0)
MPV: 9.2 fL (ref 7.5–12.5)
Monocytes Relative: 5.1 %
Neutro Abs: 3357 cells/uL (ref 1500–7800)
Neutrophils Relative %: 43.6 %
Platelets: 325 10*3/uL (ref 140–400)
RBC: 3.68 10*6/uL — ABNORMAL LOW (ref 3.80–5.10)
RDW: 16.6 % — ABNORMAL HIGH (ref 11.0–15.0)
Total Lymphocyte: 44.2 %
WBC: 7.7 10*3/uL (ref 3.8–10.8)

## 2021-01-28 LAB — HEMOGLOBIN A1C
Hgb A1c MFr Bld: 5.5 % of total Hgb (ref <5.7)
Mean Plasma Glucose: 111 mg/dL
eAG (mmol/L): 6.2 mmol/L

## 2021-01-28 LAB — LEVETIRACETAM, IMMUNOASSAY: LEVETIRACETAM, IMMUNOASSAY: 87.6 ug/mL — ABNORMAL HIGH (ref 6.0–46.0)

## 2021-01-28 LAB — URIC ACID: Uric Acid, Serum: 5.7 mg/dL (ref 2.5–7.0)

## 2021-01-28 LAB — MAGNESIUM: Magnesium: 1.8 mg/dL (ref 1.5–2.5)

## 2021-01-28 LAB — VITAMIN D 25 HYDROXY (VIT D DEFICIENCY, FRACTURES): Vit D, 25-Hydroxy: 23 ng/mL — ABNORMAL LOW (ref 30–100)

## 2021-01-28 LAB — VITAMIN B12: Vitamin B-12: >2000 pg/mL — ABNORMAL HIGH (ref 200–1100)

## 2021-01-28 LAB — TSH: TSH: 2.55 mIU/L (ref 0.40–4.50)

## 2021-02-01 ENCOUNTER — Other Ambulatory Visit: Payer: Self-pay | Admitting: Adult Health

## 2021-02-01 DIAGNOSIS — G40909 Epilepsy, unspecified, not intractable, without status epilepticus: Secondary | ICD-10-CM

## 2021-02-01 DIAGNOSIS — Z79899 Other long term (current) drug therapy: Secondary | ICD-10-CM

## 2021-02-08 ENCOUNTER — Other Ambulatory Visit: Payer: Self-pay

## 2021-02-08 ENCOUNTER — Other Ambulatory Visit: Payer: Medicare Other

## 2021-02-08 DIAGNOSIS — G40909 Epilepsy, unspecified, not intractable, without status epilepticus: Secondary | ICD-10-CM

## 2021-02-08 DIAGNOSIS — Z79899 Other long term (current) drug therapy: Secondary | ICD-10-CM

## 2021-02-10 ENCOUNTER — Other Ambulatory Visit: Payer: Self-pay | Admitting: Adult Health

## 2021-02-10 LAB — LEVETIRACETAM, IMMUNOASSAY: LEVETIRACETAM, IMMUNOASSAY: 69.8 ug/mL — ABNORMAL HIGH (ref 6.0–46.0)

## 2021-02-14 NOTE — Progress Notes (Signed)
Patient is aware of lab results and states that she has been taking Keppra in error and that she will discontinue it until further instructed. -e welch

## 2021-02-15 ENCOUNTER — Telehealth: Payer: Self-pay | Admitting: Adult Health

## 2021-02-15 NOTE — Telephone Encounter (Signed)
We received a fax from [place]; requesting CPAP supplies and supporting documentation. I called [employer] to verify this is the DME company she uses for CPAP supplies. She states this is not her DME company. She states [employer] is DME provider, even though she has never received a REQUIRED power cord to her CPAP machine nor any supplies since CPAP machine arrived. Multiple attempts made by patient and sleep medicine provider's office to request supplies, from [employer], per patient.   I advised patient to call me back with the model and serial number of her CPAP machine, and I would try to locate the appropriate power cord w/ any  DME provider able to help. The request form recieved from [place] was faxed back, stating patient did not order.

## 2021-02-15 NOTE — Telephone Encounter (Signed)
I have reached out to the management team with [employer]. I am waiting for a response on next plan.

## 2021-02-16 ENCOUNTER — Other Ambulatory Visit: Payer: Self-pay

## 2021-02-16 MED ORDER — PANTOPRAZOLE SODIUM 40 MG PO TBEC: DELAYED_RELEASE_TABLET | ORAL | 3 refills | Status: DC

## 2021-02-16 MED ORDER — ALLOPURINOL 100 MG PO TABS: ORAL_TABLET | ORAL | 3 refills | Status: DC

## 2021-02-16 NOTE — Telephone Encounter (Signed)
Per [employer] the patient recently received supplies on 1/6. Yolanda Espinoza, with [employer] attempted to contact the patient and left a message on patient's machine.   "She did just receive supplies on 01/06. It looks like to me that she received a replacement from [facility] for her machine"  I responded to [employer]'s update with them to look into this situation related to power cord. The replacement machine she is using was from [employer] and not [employer] [facility] (it was her replacement machine from recall).

## 2021-02-21 ENCOUNTER — Other Ambulatory Visit: Payer: Self-pay | Admitting: Adult Health

## 2021-02-21 NOTE — Telephone Encounter (Signed)
Received an email from the DME management team stating they spoke with the pt and she has everything she needs.  "PT called back and I was talking to her on the phone and she has had her power cord the whole and she said that she so sorry! Problem solved!"

## 2021-02-28 ENCOUNTER — Other Ambulatory Visit: Payer: Self-pay

## 2021-02-28 MED ORDER — PANTOPRAZOLE SODIUM 40 MG PO TBEC: DELAYED_RELEASE_TABLET | ORAL | 3 refills | Status: DC

## 2021-02-28 MED ORDER — ALLOPURINOL 100 MG PO TABS: ORAL_TABLET | ORAL | 3 refills | Status: DC

## 2021-03-10 ENCOUNTER — Ambulatory Visit: Payer: Medicare PPO | Admitting: Adult Health

## 2021-03-10 ENCOUNTER — Encounter: Payer: Self-pay | Admitting: Adult Health

## 2021-03-10 ENCOUNTER — Other Ambulatory Visit: Payer: Self-pay

## 2021-03-10 VITALS — HR 102 | Temp 97.5°F

## 2021-03-10 DIAGNOSIS — J40 Bronchitis, not specified as acute or chronic: Secondary | ICD-10-CM

## 2021-03-10 DIAGNOSIS — R6889 Other general symptoms and signs: Secondary | ICD-10-CM | POA: Diagnosis not present

## 2021-03-10 DIAGNOSIS — U071 COVID-19: Secondary | ICD-10-CM | POA: Diagnosis not present

## 2021-03-10 DIAGNOSIS — Z1152 Encounter for screening for COVID-19: Secondary | ICD-10-CM

## 2021-03-10 DIAGNOSIS — J329 Chronic sinusitis, unspecified: Secondary | ICD-10-CM

## 2021-03-10 LAB — POCT INFLUENZA A/B
Influenza A, POC: NEGATIVE
Influenza B, POC: NEGATIVE

## 2021-03-10 LAB — POC COVID19 BINAXNOW: SARS Coronavirus 2 Ag: POSITIVE — AB

## 2021-03-10 MED ORDER — DEXAMETHASONE 1 MG PO TABS: ORAL_TABLET | ORAL | 0 refills | Status: DC

## 2021-03-10 MED ORDER — BENZONATATE 200 MG PO CAPS: ORAL_CAPSULE | ORAL | 1 refills | Status: DC

## 2021-03-10 MED ORDER — MOLNUPIRAVIR EUA 200MG CAPSULE: 4.0000 | ORAL_CAPSULE | Freq: Two times a day (BID) | ORAL | 0 refills | Status: AC

## 2021-03-10 NOTE — Progress Notes (Signed)
THIS ENCOUNTER IS A VIRTUAL VISIT DUE TO COVID-19 - PATIENT WAS NOT SEEN IN THE OFFICE.  PATIENT HAS CONSENTED TO VIRTUAL VISIT / TELEMEDICINE VISIT   Virtual Visit via telephone Note  I connected with  Yolanda Espinoza on 03/10/2021 by telephone.  I verified that I am speaking with the correct person using two identifiers.    I discussed the limitations of evaluation and management by telemedicine and the availability of in person appointments. The patient expressed understanding and agreed to proceed.  History of Present Illness:  Pulse (!) 102    Temp (!) 97.5 F (36.4 C)    SpO2 98%  66 y.o. patient with htn, CHF, morbid obesity, CKD 3b, seizure disorder contacted office reporting URI sx. she tested positive by rapid drive through screen. OV was conducted by telephone to minimize exposure. This patient was vaccinated for covid 19, last 02/2020  Sx began 2 days ago with dry/burning throat, joint aches, headache, cough (from chest, non-productive but sense of chest congestion), mild dyspnea with exertion, fatigue. Denies dizziness.   Monitoring O2 with pulse ox at home reports currently 97%, pulse 103  Treatments tried so far: promethazine - DM, still coughing   Exposures: works at [employer], 6 residents with covid  Has had about 3 bottles of water today -  Lab Results  Component Value Date   EGFR 44 (L) 01/26/2021    Medications  Current Outpatient Medications (Endocrine & Metabolic):    estrogens, conjugated, (PREMARIN) 0.45 MG tablet, Take 1 tablet daily for hormones  Current Outpatient Medications (Cardiovascular):    amLODipine (NORVASC) 10 MG tablet, TAKE 1 TABLET BY MOUTH DAILY FOR BLOOD PRESSURE   furosemide (LASIX) 40 MG tablet, Take 1 tablet Daily for BP, Fluid Retention & Ankle Swelling   olmesartan (BENICAR) 40 MG tablet, Take one tablet daily for Blood Pressure.  Current Outpatient Medications (Respiratory):    fexofenadine (ALLEGRA ALLERGY) 180 MG tablet, Take 1 tablet  (180 mg total) by mouth daily.   benzonatate (TESSALON) 200 MG capsule, Take 1 perle 3 x / day to prevent cough (Patient not taking: Reported on 03/10/2021)  Current Outpatient Medications (Analgesics):    allopurinol (ZYLOPRIM) 100 MG tablet, Take 1 tablet  Daily to Prevent Gout   aspirin EC 81 MG tablet, Take 1 tablet (81 mg total) by mouth daily.  Current Outpatient Medications (Hematological):    ticagrelor (BRILINTA) 90 MG TABS tablet, Take 1 tablet  2 x /day (every 12 hours )  to prevent Blood Clots   vitamin B-12 (CYANOCOBALAMIN) 500 MCG tablet, Taking 2557mg daily  Current Outpatient Medications (Other):    famotidine (PEPCID) 20 MG tablet, TAKE ONE TABLET BY MOUTH DAILY AT 9 AM EVERY MORNING, 30-60 MINUTES PRIOR TO BREAKFAST FOR REFLUX.   Multiple Vitamins-Minerals (MULTIVITAMIN WITH MINERALS) tablet, Take 1 tablet by mouth daily.   pantoprazole (PROTONIX) 40 MG tablet, Take one tablet at bedtime   cholecalciferol (VITAMIN D3) 25 MCG (1000 UT) tablet, Take 1,000 Units by mouth daily. (Patient not taking: Reported on 03/10/2021)  Allergies:  Allergies  Allergen Reactions   Promethazine-Dm     Palpitations   Ace Inhibitors Nausea And Vomiting   Aspirin Nausea And Vomiting    Can take coated Aspirin    Problem list She has Essential hypertension; Mitral regurgitation; Chronic diastolic heart failure ([place]; OSA on CPAP; Vitamin D deficiency; Hyperlipidemia; Brain aneurysm; Abnormal glucose; Medication management; Morbid obesity ([place] - BMI 30+ with OSA; Gout; Gastroesophageal reflux disease; Anemia  of chronic disease- renal; Allergic rhinitis; Persistent cough; CKD stage G3b/A2, GFR 30-44 and albumin creatinine ratio 30-299 mg/g ([place]); History of anemia due to chronic kidney disease; Nocturia more than twice per night; Chronic spontaneous subarach intracran bleed due to cerebral aneurysm ([place]); Seizure disorder ([place]); Severe obstructive sleep apnea-hypopnea syndrome; and Prolonged Q-T  interval on ECG on their problem list.   Social History:   reports that she quit smoking about 22 years ago. Her smoking use included cigarettes. She has a 0.75 pack-year smoking history. She has never used smokeless tobacco. She reports that she does not drink alcohol and does not use drugs.  Observations/Objective:  General : Hoarse sounding patient in no acute distress HEENT: no hoarseness, no cough for duration of visit Lungs: speaks in broken sentences, mildly increased breathing with conversations, no audible wheezing, no apparent distress Neurological: alert, oriented x 3 Psychiatric: pleasant, judgement appropriate   Assessment and Plan:  Covid 19 Covid 19 positive per rapid screening test at office by drive through Risk factors include: CKD, htn, morbid obesity Symptoms are: mild/mod Due to co morbid conditions and risk factors, discussed antivirals  Discussed and sent in monupiravir due to late stage CKD, with mild tachy concern she may be mildly dehydrated while ill and cause GFR to decline further, no renal adjustment with this agent Immue support reviewed  Take tylenol PRN temp 101+ Push hydration Regular ambulation or calf exercises exercises for clot prevention and 81 mg ASA unless contraindicated Sx supportive therapy suggested Follow up via mychart or telephone if needed Advised patient obtain O2 monitor; present to ED if persistently <92% or with severe dyspnea, CP, fever uncontrolled by tylenol, confusion, sudden decline Should remain in isolation until at least 5 days from onset of sx, 24-48 hours fever free without tylenol, sx such as cough are improved.  -     dexamethasone (DECADRON) 1 MG tablet; Take 3 tabs for 3 days, 2 tabs for 3 days 1 tab for 5 days. Take with food. -     benzonatate (TESSALON) 200 MG capsule; Take 1 perle 3 x / day to prevent cough -     molnupiravir EUA (LAGEVRIO) 200 mg CAPS capsule; Take 4 capsules (800 mg total) by mouth 2 (two) times  daily for 5 days.   Follow Up Instructions:  I discussed the assessment and treatment plan with the patient. The patient was provided an opportunity to ask questions and all were answered. The patient agreed with the plan and demonstrated an understanding of the instructions.   The patient was advised to call back or seek an in-person evaluation if the symptoms worsen or if the condition fails to improve as anticipated.  I provided 20 minutes of non-face-to-face time during this encounter.   Izora Ribas, NP

## 2021-03-17 ENCOUNTER — Ambulatory Visit: Payer: BC Managed Care – PPO | Admitting: Neurology

## 2021-03-29 ENCOUNTER — Ambulatory Visit: Payer: Medicare HMO | Admitting: Internal Medicine

## 2021-04-01 ENCOUNTER — Other Ambulatory Visit: Payer: Self-pay

## 2021-04-01 MED ORDER — FAMOTIDINE 20 MG PO TABS: ORAL_TABLET | ORAL | 3 refills | Status: DC

## 2021-04-01 MED ORDER — AMLODIPINE BESYLATE 10 MG PO TABS: ORAL_TABLET | ORAL | 3 refills | Status: DC

## 2021-04-01 MED ORDER — OLMESARTAN MEDOXOMIL 40 MG PO TABS: ORAL_TABLET | ORAL | 3 refills | Status: DC

## 2021-04-01 MED ORDER — PANTOPRAZOLE SODIUM 40 MG PO TBEC: DELAYED_RELEASE_TABLET | ORAL | 3 refills | Status: DC

## 2021-04-11 ENCOUNTER — Other Ambulatory Visit: Payer: Self-pay | Admitting: Internal Medicine

## 2021-04-11 DIAGNOSIS — M109 Gout, unspecified: Secondary | ICD-10-CM

## 2021-04-11 MED ORDER — ALLOPURINOL 100 MG PO TABS: ORAL_TABLET | ORAL | 3 refills | Status: DC

## 2021-04-12 ENCOUNTER — Other Ambulatory Visit: Payer: Self-pay | Admitting: Adult Health

## 2021-04-20 ENCOUNTER — Ambulatory Visit: Payer: Medicare HMO | Admitting: Adult Health

## 2021-04-20 ENCOUNTER — Encounter: Payer: Self-pay | Admitting: Adult Health

## 2021-04-20 VITALS — BP 165/87 | HR 61 | Ht 70.0 in | Wt 229.2 lb

## 2021-04-20 DIAGNOSIS — G4733 Obstructive sleep apnea (adult) (pediatric): Secondary | ICD-10-CM | POA: Diagnosis not present

## 2021-04-20 DIAGNOSIS — Z9989 Dependence on other enabling machines and devices: Secondary | ICD-10-CM | POA: Diagnosis not present

## 2021-04-20 NOTE — Progress Notes (Signed)
? ? ?PATIENT: Yolanda Espinoza ?DOB: 09-23-1955 ? ?REASON FOR VISIT: follow up ?HISTORY FROM: patient ?PRIMARY NEUROLOGIST: Dr. Leta Baptist ?Sleep neurologist: Dr. Beacher May ? ?Chief Complaint  ?Patient presents with  ? Follow-up  ?  Pt in 19 pt is here for CPAP follow up  Pt states she has no questions or concerns for this visit   ? ? ? ?HISTORY OF PRESENT ILLNESS: ?Today 04/20/21 ? ?Yolanda Espinoza is a 66 year old female with a history of obstructive sleep apnea on CPAP.  She returns today for her initial CPAP download.  She reports that she did not receive the same dream station.  Reports that the CPAP does not have humidification and therefore she is not able to use it for very long due to dry mouth.  This was sent to her by [employer] due to the recent recall.  Reports that she did try to reach out to [facility] for help. ? ?Current download shows that she used the machine 20 out of 30 days for compliance of 66.7%.  She used her machine greater than 4 hours for compliance of 13.3%.  On average she uses her machine 2 hours and 31 minutes.  Her residual AHI is 9.7 on 4 to 20 cm of water.  She returns today for follow-up. ?  ? ?REVIEW OF SYSTEMS: Out of a complete 14 system review of symptoms, the patient complains only of the following symptoms, and all other reviewed systems are negative. ? ? ?ESS 11 ? ?ALLERGIES: ?Allergies  ?Allergen Reactions  ? Promethazine-Dm   ?  Palpitations  ? Ace Inhibitors Nausea And Vomiting  ? Aspirin Nausea And Vomiting  ?  Can take coated Aspirin  ? ? ?HOME MEDICATIONS: ?Outpatient Medications Prior to Visit  ?Medication Sig Dispense Refill  ? allopurinol (ZYLOPRIM) 100 MG tablet Take 1 tablet  Daily to Prevent Gout 90 tablet 3  ? amLODipine (NORVASC) 10 MG tablet TAKE ONE TABLET BY MOUTH DAILY AT 9 AM FOR BLOOD PRESSURE 90 tablet 3  ? aspirin EC 81 MG tablet Take 1 tablet (81 mg total) by mouth daily.    ? benzonatate (TESSALON) 200 MG capsule Take 1 perle 3 x / day to prevent cough 30 capsule 1   ? cholecalciferol (VITAMIN D3) 25 MCG (1000 UT) tablet Take 1,000 Units by mouth daily.    ? cyclobenzaprine (FLEXERIL) 10 MG tablet TAKE ONE TABLET BY MOUTH AT BEDTIME AS NEEDED FOR MUSCLE SPASMS (VIAL) 90 tablet 3  ? dexamethasone (DECADRON) 1 MG tablet Take 3 tabs for 3 days, 2 tabs for 3 days 1 tab for 5 days. Take with food. 20 tablet 0  ? famotidine (PEPCID) 20 MG tablet TAKE ONE TABLET BY MOUTH DAILY AT 9 AM EVERY MORNING, 30-60 MINUTES PRIOR TO BREAKFAST FOR REFLUX. 90 tablet 3  ? fexofenadine (ALLEGRA ALLERGY) 180 MG tablet Take 1 tablet (180 mg total) by mouth daily. 30 tablet 2  ? furosemide (LASIX) 40 MG tablet Take 1 tablet Daily for BP, Fluid Retention & Ankle Swelling 90 tablet 1  ? Multiple Vitamins-Minerals (MULTIVITAMIN WITH MINERALS) tablet Take 1 tablet by mouth daily.    ? olmesartan (BENICAR) 40 MG tablet Take one tablet daily for Blood Pressure. 90 tablet 3  ? pantoprazole (PROTONIX) 40 MG tablet Take one tablet at bedtime 90 tablet 3  ? ticagrelor (BRILINTA) 90 MG TABS tablet Take 1 tablet  2 x /day (every 12 hours )  to prevent Blood Clots 180 tablet 1  ? vitamin  B-12 (CYANOCOBALAMIN) 500 MCG tablet Taking 2563mcg daily 30 tablet 0  ? estrogens, conjugated, (PREMARIN) 0.45 MG tablet Take 1 tablet daily for hormones 90 tablet 1  ? ?No facility-administered medications prior to visit.  ? ? ?PAST MEDICAL HISTORY: ?Past Medical History:  ?Diagnosis Date  ? Anemia   ? Arthritis   ? Chronic diastolic heart failure ([place])   ? GERD (gastroesophageal reflux disease)   ? Heart murmur   ? Hyperlipidemia   ? Hypertension may, 1973  ? Mitral regurgitation   ? a. Echo 01/2012 mild LVH, EF 55-65%, Gr 2 DD, mod MR, mild LAE, PASP 36;  b. Echo (02/2013):  EF 55-60%, Gr 1 DD, mild to mod MR, mild LAE (no sig change since prior echo) // c. Echo 10/18: EF 55-60, normal wall motion, grade 2 diastolic dysfunction, trivial AI, mild to moderate MR, mild LAE, trivial PI, PASP 38    ? OSA on CPAP   ? 7 yrs  ? Seizures  ([place]) 01/2017  ? Stroke [facility]) 07  ? no weakness jan 29th 2019 no defecits  ? Subarachnoid hemorrhage due to ruptured aneurysm [facility])   ? 2003 - s/p coiling  ? Unspecified vitamin D deficiency   ? ? ?PAST SURGICAL HISTORY: ?Past Surgical History:  ?Procedure Laterality Date  ? ABDOMINAL HYSTERECTOMY    ? complete  ? ANEURYSM COILING    ? COLONOSCOPY WITH PROPOFOL N/A 09/05/2018  ? Procedure: COLONOSCOPY WITH PROPOFOL;  Surgeon: Juanita Craver, MD;  Location: [facility] ENDOSCOPY;  Service: Endoscopy;  Laterality: N/A;  ? ESOPHAGOGASTRODUODENOSCOPY (EGD) WITH PROPOFOL N/A 09/05/2018  ? Procedure: ESOPHAGOGASTRODUODENOSCOPY (EGD) WITH PROPOFOL;  Surgeon: Juanita Craver, MD;  Location: [facility] ENDOSCOPY;  Service: Endoscopy;  Laterality: N/A;  ? GIVENS CAPSULE STUDY N/A 10/17/2018  ? Procedure: GIVENS CAPSULE STUDY;  Surgeon: Juanita Craver, MD;  Location: [facility] ENDOSCOPY;  Service: Endoscopy;  Laterality: N/A;  ? head surgery    ? aneurysm ruptured- stroke  ? IR 3D INDEPENDENT [facility]ST  09/04/2016  ? IR ANGIO INTRA EXTRACRAN SEL COM CAROTID INNOMINATE BILAT MOD SED  09/04/2016  ? IR ANGIO INTRA EXTRACRAN SEL INTERNAL CAROTID UNI L MOD SED  02/21/2017  ? IR ANGIO VERTEBRAL SEL VERTEBRAL BILAT MOD SED  09/04/2016  ? IR ANGIOGRAM EXTREMITY LEFT  09/04/2016  ? IR ANGIOGRAM FOLLOW UP STUDY  02/21/2017  ? IR NEURO EACH ADD'L AFTER BASIC UNI LEFT (MS)  02/21/2017  ? IR RADIOLOGIST EVAL & MGMT  10/11/2016  ? IR RADIOLOGIST EVAL & MGMT  03/07/2017  ? IR TRANSCATH/EMBOLIZ  02/21/2017  ? RADIOLOGY WITH ANESTHESIA N/A 04/23/2013  ? Procedure: RADIOLOGY WITH ANESTHESIA;  Surgeon: Rob Hickman, MD;  Location: [facility];  Service: Radiology;  Laterality: N/A;  ? RADIOLOGY WITH ANESTHESIA N/A 10/30/2016  ? Procedure: EMBOLIZATION;  Surgeon: Luanne Bras, MD;  Location: [place];  Service: Radiology;  Laterality: N/A;  ? RADIOLOGY WITH ANESTHESIA N/A 02/21/2017  ? Procedure: EMBOLIZATION;  Surgeon: Luanne Bras, MD;  Location: [place];  Service: Radiology;  Laterality:  N/A;  ? TONSILLECTOMY AND ADENOIDECTOMY    ? TUBAL LIGATION    ? ? ?FAMILY HISTORY: ?Family History  ?Problem Relation Age of Onset  ? Hypertension Father   ? Kidney disease Father   ? Hypertension Sister   ? Cancer Sister 61  ?     Colon cancer  ? Hypertension Sister   ? Hypertension Sister   ? ? ?SOCIAL HISTORY: ?Social History  ? ?Socioeconomic History  ? Marital  status: Married  ?  Spouse name: Not on file  ? Number of children: 2  ? Years of education: 29  ? Highest education level: Not on file  ?Occupational History  ?  Comment: [employer], part time  ?Tobacco Use  ? Smoking status: Former  ?  Packs/day: 0.25  ?  Years: 3.00  ?  Pack years: 0.75  ?  Types: Cigarettes  ?  Quit date: 01/24/1999  ?  Years since quitting: 22.2  ? Smokeless tobacco: Never  ?Vaping Use  ? Vaping Use: Never used  ?Substance and Sexual Activity  ? Alcohol use: No  ? Drug use: No  ? Sexual activity: Not Currently  ?  Partners: Male  ?  Birth control/protection: Post-menopausal  ?Other Topics Concern  ? Not on file  ?Social History Narrative  ? Lives with husband  ? Caffeine- maybe 1/2 soda, 1 1/2 cups coffee  ? ?Social Determinants of Health  ? ?Financial Resource Strain: Not on file  ?Food Insecurity: Not on file  ?Transportation Needs: Not on file  ?Physical Activity: Not on file  ?Stress: Not on file  ?Social Connections: Not on file  ?Intimate Partner Violence: Not on file  ? ? ? ? ?PHYSICAL EXAM ? ?Vitals:  ? 04/20/21 0903  ?BP: (!) 165/87  ?Pulse: 61  ?Weight: 229 lb 3.2 oz (104 kg)  ?Height: 5\' 10"  (1.778 m)  ? ?Body mass index is 32.89 kg/m?. ? ?Generalized: Well developed, in no acute distress  ?Chest: Lungs clear to auscultation bilaterally ? ?Neurological examination  ?Mentation: Alert oriented to time, place, history taking. Follows all commands speech and language fluent ?Cranial nerve II-XII: Extraocular movements were full, visual field were full on confrontational test Head turning and shoulder shrug  were normal and  symmetric. ?Motor: The motor testing reveals 5 over 5 strength of all 4 extremities. Good symmetric motor tone is noted throughout.  ?Sensory: Sensory testing is intact to soft touch on all 4 extremiti

## 2021-04-27 NOTE — Progress Notes (Signed)
Message ?Received: Today ?Yolanda Amass, RN; Vanessa Ralphs ?So I called this patient and she is going to send me a picture of her machine. [employer] has sent her a replacement. I think that she just needs to connect the humidifier to the replacement.   ? ?  ?Previous Messages ?  ?----- Message -----  ?From: Brandon Melnick, RN  ?Sent: 04/25/2021   7:42 AM EDT  ?To: Ocie Bob  ? ?Good Monday Morning.    ? ?Hey this is the issue,  per Megan's note.    ? ?1. OSA on CPAP  ?   ?- CPAP compliance suboptimal  ?- Residual AHI remains elevated  ?-Send order to DME company stating that she needed machine with humidification.  I have since reached out to the DME manager Jeneen Rinks who states that she needs to call [employer] as they sent her the machine.  Since [employer] is the DME company I have asked that they reach out to the patient to walk her through the steps to get the appropriate machine.  I have also sent an order with the appropriate settings.  ?Thank you.    ? ?Sandy RN  ?   ?----- Message -----  ?From: Marchelle Gearing  ?Sent: 04/22/2021  11:47 AM EDT  ?To: Vanessa Ralphs, Brandon Melnick, RN  ? ?Hey this patient received a machine 10/2018 and she is not eligible for a new machine until 10/2023  ? ?----- Message -----  ?From: Brandon Melnick, RN  ?Sent: 04/20/2021   5:16 PM EDT  ?To: Ocie Bob  ? ?New order in Epic.  For replacing machine.    ?Thayer Jew  ?MRN [id number]  ?DOB 04/20/2055  ? ? ?Thanks Lovey Newcomer RN  ? ?  ? ?

## 2021-05-12 NOTE — Progress Notes (Signed)
? ?Cardiology Office Note ? ? ?Date:  05/13/2021  ? ?ID:  Yolanda Espinoza, DOB 05-21-1955, MRN [id number] ? ?PCP:  Liane Comber, NP  ?Cardiologist:   Mertie Moores, MD  ? ?Chief Complaint  ?Patient presents with  ? Hypertension  ?   ?  ? Congestive Heart Failure  ?   ?  ? ?1. hypertension ?2. Heart murmur ?3. Cerebral aneurysm ? ?  ? ?[name] is a 66 year old female with a history of hypertension and a cerebral aneurysm. She was to have a heart murmur and was referred here for further evaluation.  She also has been having some shortness of breath. She exercises for 30 minutes every day typically does not have much shortness breath when she is exercising. She seems to have shortness of breath at other times.  She denies any syncope or presyncope. She denies any chest pain. ? ?She works at [employer]. She does also helps the seniors with activities. ? ?Hx of blood pressure on occasion. Her blood pressure readings have been normal. ?  ?Feb. 17, 2016: ?  ?Yolanda Espinoza is a 66 y.o. female who presents for follow up of her HTN ?She is dong well.  She has had the cerebral coils removed and had another procedure. ?Headaches have resolved. ?BP is well controlled.  ?Getting some exercise.  Still working at [employer]. ? ?Feb. 16, 2017: ?Doing well.  ?BP has been well controlled.   Has lost some weight.   Exercising  ? ?April 07, 2016:   ? ?Yolanda Espinoza is seen back.   ?Has had some leg swelling  ?( is on Amlodipine )  ?Some dyspnea.  ? ?August 04, 2019: ? ?Yolanda Espinoza is seen back today for follow up of her HTN ?Had a cerebral aneurism in Jan.   Associated with a CVA and seizures.  ?Doing well from a cardiac standpoint  ? ?Jan. 20, 2022: ?Yolanda Espinoza is seen back today for hx of HTN, cerebral aneurism. ?Had a bone marrow bx recently .   Due to iron deficiency anemia  ? ?May 13, 2021 ?Yolanda Espinoza is seen back today for follow up of her 'HTN, cerebral aneurims ?Was to see Dr. Johney Frame but is back to see me today ?No Cp ,  no dyspnea ?Had COVID in Feb. ?Had a rough month but is getting better  ? ? ?Past Medical History:  ?Diagnosis Date  ? Anemia   ? Arthritis   ? Chronic diastolic heart failure ([place])   ? GERD (gastroesophageal reflux disease)   ? Heart murmur   ? Hyperlipidemia   ? Hypertension may, 1973  ? Mitral regurgitation   ? a. Echo 01/2012 mild LVH, EF 55-65%, Gr 2 DD, mod MR, mild LAE, PASP 36;  b. Echo (02/2013):  EF 55-60%, Gr 1 DD, mild to mod MR, mild LAE (no sig change since prior echo) // c. Echo 10/18: EF 55-60, normal wall motion, grade 2 diastolic dysfunction, trivial AI, mild to moderate MR, mild LAE, trivial PI, PASP 38    ? OSA on CPAP   ? 7 yrs  ? Seizures ([place]) 01/2017  ? Stroke [facility]) 07  ? no weakness jan 29th 2019 no defecits  ? Subarachnoid hemorrhage due to ruptured aneurysm [facility])   ? 2003 - s/p coiling  ? Unspecified vitamin D deficiency   ? ? ?Past Surgical History:  ?Procedure Laterality Date  ? ABDOMINAL HYSTERECTOMY    ? complete  ? ANEURYSM COILING    ?  COLONOSCOPY WITH PROPOFOL N/A 09/05/2018  ? Procedure: COLONOSCOPY WITH PROPOFOL;  Surgeon: Juanita Craver, MD;  Location: [facility] ENDOSCOPY;  Service: Endoscopy;  Laterality: N/A;  ? ESOPHAGOGASTRODUODENOSCOPY (EGD) WITH PROPOFOL N/A 09/05/2018  ? Procedure: ESOPHAGOGASTRODUODENOSCOPY (EGD) WITH PROPOFOL;  Surgeon: Juanita Craver, MD;  Location: [facility] ENDOSCOPY;  Service: Endoscopy;  Laterality: N/A;  ? GIVENS CAPSULE STUDY N/A 10/17/2018  ? Procedure: GIVENS CAPSULE STUDY;  Surgeon: Juanita Craver, MD;  Location: [facility] ENDOSCOPY;  Service: Endoscopy;  Laterality: N/A;  ? head surgery    ? aneurysm ruptured- stroke  ? IR 3D INDEPENDENT WKST  09/04/2016  ? IR ANGIO INTRA EXTRACRAN SEL COM CAROTID INNOMINATE BILAT MOD SED  09/04/2016  ? IR ANGIO INTRA EXTRACRAN SEL INTERNAL CAROTID UNI L MOD SED  02/21/2017  ? IR ANGIO VERTEBRAL SEL VERTEBRAL BILAT MOD SED  09/04/2016  ? IR ANGIOGRAM EXTREMITY LEFT  09/04/2016  ? IR ANGIOGRAM FOLLOW UP STUDY  02/21/2017  ? IR NEURO EACH ADD'L  AFTER BASIC UNI LEFT (MS)  02/21/2017  ? IR RADIOLOGIST EVAL & MGMT  10/11/2016  ? IR RADIOLOGIST EVAL & MGMT  03/07/2017  ? IR TRANSCATH/EMBOLIZ  02/21/2017  ? RADIOLOGY WITH ANESTHESIA N/A 04/23/2013  ? Procedure: RADIOLOGY WITH ANESTHESIA;  Surgeon: Rob Hickman, MD;  Location: [place];  Service: Radiology;  Laterality: N/A;  ? RADIOLOGY WITH ANESTHESIA N/A 10/30/2016  ? Procedure: EMBOLIZATION;  Surgeon: Luanne Bras, MD;  [place];  Service: Radiology;  Laterality: N/A;  ? RADIOLOGY WITH ANESTHESIA N/A 02/21/2017  ? Procedure: EMBOLIZATION;  Surgeon: Luanne Bras, MD;  Location: [place];  Service: Radiology;  Laterality: N/A;  ? TONSILLECTOMY AND ADENOIDECTOMY    ? TUBAL LIGATION    ? ? ? ?Current Outpatient Medications  ?Medication Sig Dispense Refill  ? allopurinol (ZYLOPRIM) 100 MG tablet Take 1 tablet  Daily to Prevent Gout 90 tablet 3  ? amLODipine (NORVASC) 10 MG tablet TAKE ONE TABLET BY MOUTH DAILY AT 9 AM FOR BLOOD PRESSURE 90 tablet 3  ? aspirin EC 81 MG tablet Take 1 tablet (81 mg total) by mouth daily.    ? cholecalciferol (VITAMIN D3) 25 MCG (1000 UT) tablet Take 1,000 Units by mouth daily.    ? cyclobenzaprine (FLEXERIL) 10 MG tablet TAKE ONE TABLET BY MOUTH AT BEDTIME AS NEEDED FOR MUSCLE SPASMS (VIAL) 90 tablet 3  ? diclofenac (VOLTAREN) 75 MG EC tablet Take by mouth. Take by mouth.    ? estrogens, conjugated, (PREMARIN) 0.45 MG tablet Take 1 tablet daily for hormones 90 tablet 1  ? famotidine (PEPCID) 20 MG tablet TAKE ONE TABLET BY MOUTH DAILY AT 9 AM EVERY MORNING, 30-60 MINUTES PRIOR TO BREAKFAST FOR REFLUX. 90 tablet 3  ? fexofenadine (ALLEGRA ALLERGY) 180 MG tablet Take 1 tablet (180 mg total) by mouth daily. 30 tablet 2  ? furosemide (LASIX) 40 MG tablet Take 1 tablet Daily for BP, Fluid Retention & Ankle Swelling 90 tablet 1  ? levETIRAcetam (KEPPRA) 1000 MG tablet Take by mouth. Take by mouth.    ? Multiple Vitamins-Minerals (MULTIVITAMIN WITH MINERALS) tablet Take 1  tablet by mouth daily.    ? olmesartan (BENICAR) 40 MG tablet Take one tablet daily for Blood Pressure. 90 tablet 3  ? pantoprazole (PROTONIX) 40 MG tablet Take one tablet at bedtime 90 tablet 3  ? ticagrelor (BRILINTA) 90 MG TABS tablet Take 1 tablet  2 x /day (every 12 hours )  to prevent Blood Clots 180 tablet 1  ?  benzonatate (TESSALON) 200 MG capsule Take 1 perle 3 x / day to prevent cough (Patient not taking: Reported on 05/13/2021) 30 capsule 1  ? dexamethasone (DECADRON) 1 MG tablet Take 3 tabs for 3 days, 2 tabs for 3 days 1 tab for 5 days. Take with food. (Patient not taking: Reported on 05/13/2021) 20 tablet 0  ? vitamin B-12 (CYANOCOBALAMIN) 500 MCG tablet Taking 2511mcg daily (Patient not taking: Reported on 05/13/2021) 30 tablet 0  ? ?No current facility-administered medications for this visit.  ? ? ?Allergies:   Promethazine-dm, Ace inhibitors, and Aspirin  ? ? ?Social History:  The patient  reports that she quit smoking about 22 years ago. Her smoking use included cigarettes. She has a 0.75 pack-year smoking history. She has never used smokeless tobacco. She reports that she does not drink alcohol and does not use drugs.  ? ?Family History:  The patient's family history includes Cancer (age of onset: 39) in her sister; Hypertension in her father, sister, sister, and sister; Kidney disease in her father.  ? ? ?ROS:  Please see the history of present illness.  ?All other systems are negative ? ? ? ?Physical Exam: ?Blood pressure (!) 152/80, pulse 73, height 5\' 10"  (1.778 m), weight 229 lb 6.4 oz (104.1 kg), SpO2 99 %. ? ?GEN:  Well nourished, well developed in no acute distress ?HEENT: Normal ?NECK: No JVD; No carotid bruits ?LYMPHATICS: No lymphadenopathy ?CARDIAC: RRR ,  2/6 systolic murmur , radiating to below L breast  ?RESPIRATORY:  Clear to auscultation without rales, wheezing or rhonchi  ?ABDOMEN: Soft, non-tender, non-distended ?MUSCULOSKELETAL:  No edema; No deformity  ?SKIN: Warm and  dry ?NEUROLOGIC:  Alert and oriented x 3 ? ? ? ?EKG:   ? ?Recent Labs: ?01/26/2021: ALT 16; BUN 16; Creat 1.34; Hemoglobin 9.8; Magnesium 1.8; Platelets 325; Potassium 4.5; Sodium 140; TSH 2.55  ? ? ?Lipid Panel ?   ?[place]

## 2021-05-13 ENCOUNTER — Ambulatory Visit (INDEPENDENT_AMBULATORY_CARE_PROVIDER_SITE_OTHER): Payer: Medicare HMO | Admitting: Cardiovascular Disease

## 2021-05-13 ENCOUNTER — Encounter: Payer: Self-pay | Admitting: Cardiovascular Disease

## 2021-05-13 VITALS — BP 132/82 | HR 73 | Ht 70.0 in | Wt 229.4 lb

## 2021-05-13 DIAGNOSIS — I10 Essential (primary) hypertension: Secondary | ICD-10-CM | POA: Diagnosis not present

## 2021-05-13 DIAGNOSIS — Z1231 Encounter for screening mammogram for malignant neoplasm of breast: Secondary | ICD-10-CM | POA: Diagnosis not present

## 2021-05-13 DIAGNOSIS — I340 Nonrheumatic mitral (valve) insufficiency: Secondary | ICD-10-CM

## 2021-05-13 DIAGNOSIS — Z780 Asymptomatic menopausal state: Secondary | ICD-10-CM | POA: Diagnosis not present

## 2021-05-13 LAB — HM DEXA SCAN: HM Dexa Scan: NORMAL

## 2021-05-13 LAB — HM MAMMOGRAPHY

## 2021-05-13 NOTE — Patient Instructions (Signed)
Medication Instructions:  ?Your physician recommends that you continue on your current medications as directed. Please refer to the Current Medication list given to you today. ? ?*If you need a refill on your cardiac medications before your next appointment, please call your pharmacy* ? ?Lab Work: ?TODAY: BMP ?If you have labs (blood work) drawn today and your tests are completely normal, you will receive your results only by: ?MyChart Message (if you have MyChart) OR ?A paper copy in the mail ?If you have any lab test that is abnormal or we need to change your treatment, we will call you to review the results. ? ?Testing/Procedures: ?Your physician has requested that you have an echocardiogram (in April 2024, 1 week prior to follow-up visit). Echocardiography is a painless test that uses sound waves to create images of your heart. It provides your doctor with information about the size and shape of your heart and how well your heart?s chambers and valves are working. This procedure takes approximately one hour. There are no restrictions for this procedure.  ? ?Follow-Up: ?At [facility], you and your health needs are our priority.  As part of our continuing mission to provide you with exceptional heart care, we have created designated Provider Care Teams.  These Care Teams include your primary Cardiologist (physician) and Advanced Practice Providers (APPs -  Physician Assistants and Nurse Practitioners) who all work together to provide you with the care you need, when you need it. ? ?We recommend signing up for the patient portal called "MyChart".  Sign up information is provided on this After Visit Summary.  MyChart is used to connect with patients for Virtual Visits (Telemedicine).  Patients are able to view lab/test results, encounter notes, upcoming appointments, etc.  Non-urgent messages can be sent to your provider as well.   ?To learn more about what you can do with MyChart, go to [email or web address].    ? ?Your next appointment:   ?1 year(s) ? ?The format for your next appointment:   ?In Person ? ?Provider:   ?Mertie Moores, MD  or Robbie Lis, PA-C or Richardson Dopp, [place]     ? ?Other Instructions ? ?Important Information About Sugar ? ? ? ? ?  ?

## 2021-05-18 ENCOUNTER — Encounter: Payer: Self-pay | Admitting: Adult Health

## 2021-05-18 LAB — BASIC METABOLIC PANEL
BUN/Creatinine Ratio: 18 (ref 12–28)
BUN: 28 mg/dL — ABNORMAL HIGH (ref 8–27)
CO2: 21 mmol/L (ref 20–29)
Calcium: 9.7 mg/dL (ref 8.7–10.3)
Chloride: 105 mmol/L (ref 96–106)
Creatinine, Ser: 1.57 mg/dL — ABNORMAL HIGH (ref 0.57–1.00)
Glucose: 91 mg/dL (ref 70–99)
Potassium: 5.1 mmol/L (ref 3.5–5.2)
Sodium: 140 mmol/L (ref 134–144)
eGFR: 36 mL/min/{1.73_m2} — ABNORMAL LOW (ref >59)

## 2021-05-27 ENCOUNTER — Other Ambulatory Visit: Payer: Self-pay

## 2021-05-27 MED ORDER — CYCLOBENZAPRINE HCL 10 MG PO TABS: ORAL_TABLET | ORAL | 3 refills | Status: DC

## 2021-05-28 ENCOUNTER — Other Ambulatory Visit: Payer: Self-pay | Admitting: Internal Medicine

## 2021-05-28 DIAGNOSIS — M791 Myalgia, unspecified site: Secondary | ICD-10-CM

## 2021-05-28 MED ORDER — CYCLOBENZAPRINE HCL 10 MG PO TABS: ORAL_TABLET | ORAL | 3 refills | Status: DC

## 2021-06-01 ENCOUNTER — Encounter: Payer: Self-pay | Admitting: Adult Health

## 2021-06-01 ENCOUNTER — Ambulatory Visit (INDEPENDENT_AMBULATORY_CARE_PROVIDER_SITE_OTHER): Payer: Medicare HMO | Admitting: Adult Health

## 2021-06-01 VITALS — BP 136/78 | HR 75 | Temp 97.3°F | Ht 70.0 in | Wt 232.0 lb

## 2021-06-01 DIAGNOSIS — Z0001 Encounter for general adult medical examination with abnormal findings: Secondary | ICD-10-CM

## 2021-06-01 DIAGNOSIS — N1832 Chronic kidney disease, stage 3b: Secondary | ICD-10-CM | POA: Diagnosis not present

## 2021-06-01 DIAGNOSIS — R9431 Abnormal electrocardiogram [ECG] [EKG]: Secondary | ICD-10-CM

## 2021-06-01 DIAGNOSIS — E538 Deficiency of other specified B group vitamins: Secondary | ICD-10-CM | POA: Insufficient documentation

## 2021-06-01 DIAGNOSIS — R6889 Other general symptoms and signs: Secondary | ICD-10-CM | POA: Diagnosis not present

## 2021-06-01 DIAGNOSIS — J309 Allergic rhinitis, unspecified: Secondary | ICD-10-CM

## 2021-06-01 DIAGNOSIS — I5032 Chronic diastolic (congestive) heart failure: Secondary | ICD-10-CM | POA: Diagnosis not present

## 2021-06-01 DIAGNOSIS — I700 Atherosclerosis of aorta: Secondary | ICD-10-CM | POA: Diagnosis not present

## 2021-06-01 DIAGNOSIS — R7309 Other abnormal glucose: Secondary | ICD-10-CM | POA: Diagnosis not present

## 2021-06-01 DIAGNOSIS — I340 Nonrheumatic mitral (valve) insufficiency: Secondary | ICD-10-CM

## 2021-06-01 DIAGNOSIS — R053 Chronic cough: Secondary | ICD-10-CM

## 2021-06-01 DIAGNOSIS — I714 Abdominal aortic aneurysm, without rupture, unspecified: Secondary | ICD-10-CM

## 2021-06-01 DIAGNOSIS — Z79899 Other long term (current) drug therapy: Secondary | ICD-10-CM

## 2021-06-01 DIAGNOSIS — G4733 Obstructive sleep apnea (adult) (pediatric): Secondary | ICD-10-CM

## 2021-06-01 DIAGNOSIS — G40909 Epilepsy, unspecified, not intractable, without status epilepticus: Secondary | ICD-10-CM | POA: Diagnosis not present

## 2021-06-01 DIAGNOSIS — E785 Hyperlipidemia, unspecified: Secondary | ICD-10-CM

## 2021-06-01 DIAGNOSIS — D638 Anemia in other chronic diseases classified elsewhere: Secondary | ICD-10-CM

## 2021-06-01 DIAGNOSIS — Z136 Encounter for screening for cardiovascular disorders: Secondary | ICD-10-CM

## 2021-06-01 DIAGNOSIS — I10 Essential (primary) hypertension: Secondary | ICD-10-CM

## 2021-06-01 DIAGNOSIS — I671 Cerebral aneurysm, nonruptured: Secondary | ICD-10-CM | POA: Diagnosis not present

## 2021-06-01 DIAGNOSIS — K219 Gastro-esophageal reflux disease without esophagitis: Secondary | ICD-10-CM

## 2021-06-01 DIAGNOSIS — E559 Vitamin D deficiency, unspecified: Secondary | ICD-10-CM

## 2021-06-01 DIAGNOSIS — M109 Gout, unspecified: Secondary | ICD-10-CM

## 2021-06-01 DIAGNOSIS — Z0000 Encounter for general adult medical examination without abnormal findings: Secondary | ICD-10-CM

## 2021-06-01 DIAGNOSIS — I609 Nontraumatic subarachnoid hemorrhage, unspecified: Secondary | ICD-10-CM

## 2021-06-01 NOTE — Patient Instructions (Addendum)
?Please have vitamin D and B12 supplement doses and give this to the nurse over the phone when she calls for lab report  ? ?Could try estroven complete over the counter supplement for hot flashes ? ?Check at pharmacy about getting the shingrix vaccine - 2 shots, 2-6 months apart ? ?Recommend adding strengthening exercises - shallow squats with heels lifted, as this gets easier can transition into chair sits ? ?If having trouble making progress, consider a personal trainer or I can refer for physical therapy  ? ? ? ?Zoster Vaccine, Recombinant injection ?What is this medication? ?ZOSTER VACCINE (ZOS ter vak SEEN) is a vaccine used to reduce the risk of getting shingles. This vaccine is not used to treat shingles or nerve pain from shingles. ?This medicine may be used for other purposes; ask your health care provider or pharmacist if you have questions. ?COMMON BRAND NAME(S): SHINGRIX ?What should I tell my care team before I take this medication? ?They need to know if you have any of these conditions: ?cancer ?immune system problems ?an unusual or allergic reaction to Zoster vaccine, other medications, foods, dyes, or preservatives ?pregnant or trying to get pregnant ?breast-feeding ?How should I use this medication? ?This vaccine is injected into a muscle. It is given by a health care provider. ?A copy of Vaccine Information Statements will be given before each vaccination. Be sure to read this information carefully each time. This sheet may change often. ?Talk to your health care provider about the use of this vaccine in children. This vaccine is not approved for use in children. ?Overdosage: If you think you have taken too much of this medicine contact a poison control center or emergency room at once. ?NOTE: This medicine is only for you. Do not share this medicine with others. ?What if I miss a dose? ?Keep appointments for follow-up (booster) doses. It is important not to miss your dose. Call your health care  provider if you are unable to keep an appointment. ?What may interact with this medication? ?medicines that suppress your immune system ?medicines to treat cancer ?steroid medicines like prednisone or cortisone ?This list may not describe all possible interactions. Give your health care provider a list of all the medicines, herbs, non-prescription drugs, or dietary supplements you use. Also tell them if you smoke, drink alcohol, or use illegal drugs. Some items may interact with your medicine. ?What should I watch for while using this medication? ?Visit your health care provider regularly. ?This vaccine, like all vaccines, may not fully protect everyone. ?What side effects may I notice from receiving this medication? ?Side effects that you should report to your doctor or health care professional as soon as possible: ?allergic reactions (skin rash, itching or hives; swelling of the face, lips, or tongue) ?trouble breathing ?Side effects that usually do not require medical attention (report these to your doctor or health care professional if they continue or are bothersome): ?chills ?headache ?fever ?nausea ?pain, redness, or irritation at site where injected ?tiredness ?vomiting ?This list may not describe all possible side effects. Call your doctor for medical advice about side effects. You may report side effects to FDA at 1-800-FDA-1088. ?Where should I keep my medication? ?This vaccine is only given by a health care provider. It will not be stored at home. ?NOTE: This sheet is a summary. It may not cover all possible information. If you have questions about this medicine, talk to your doctor, pharmacist, or health care provider. ?? 2023 Elsevier/Gold Standard (2020-12-10 00:00:00) ? ? ? ?

## 2021-06-01 NOTE — Progress Notes (Addendum)
ANNUAL WELLNESS VISIT AND FOLLOW UP ? ?Assessment and Plan: ? ?Annual Medicare Wellness Visit ?Due annually  ?Health maintenance reviewed ?- can get shingrix at pharmacy ? ?Chronic diastolic heart failure ?Emphasized need for weight loss  ?Emphasized salt restriction, less than 2000mg  a day. ?Encouraged daily monitoring of the patient's weight, call office if 3 lb weight loss or gain in a day.  ?Encouraged regular exercise. ? decrease your fluid intake to less than 2 L daily  ?Cardiology is managing; appears euvolemic today  ? ? Mitral regurgitation ?Monitored by cardio, moderate per recent ECHO/MRI, has next planned in 2024, no SOB/CP ? ?Essential hypertension ?- continue medications, DASH diet, exercise and monitor at home. Call if greater than 130/80.  ?- CBC with Differential/Platelet ?- CMP/GFR ?- TSH ? ?Abnormal glucose ?Discussed general issues about diabetes pathophysiology and management., Educational material distributed., Suggested low cholesterol diet., Encouraged aerobic exercise., Discussed foot care., Reminded to get yearly retinal exam. ? ?Hyperlipidemia ?-pending labs plan to start statin unless much improved ? - historically did control well with lifestyle ?- check lipids, decrease fatty foods, increase activity. ?- Lipid panel ? ?Vitamin D deficiency ?Clarify current dose -  ? ?Medication management ?- Magnesium ? ?OSA on CPAP ?New machine,  ?Dr. Brett Fairy following ?Weight loss encouraged ? ? Brain aneurysm ?Dr. Estanislado Pandy released ?S/p coil, on brilinta ?Control blood pressure, cholesterol, glucose, increase exercise.  ? ? Seizure disorder ([place]) ?R/t CVA/aneurysm; Dr. Estanislado Pandy released, on keppra, check levels here  ?- currently back to taking 1000 mg BID, recheck and reduce to 500 mg BID if remains elevated  ?- levetiracetam (keppra) level ? ?Chronic spontaneous subarach intracran bleed due to cerebral aneurysm ([place]) ?Stable on MRI 2022; Dr. Brett Fairy follows -  ? ?Gout of foot, unspecified  cause, unspecified chronicity, unspecified laterality ?- Gout- recheck Uric acid as needed, Diet discussed, continue medications. ? ?Gastroesophageal reflux disease, esophagitis presence not specified ?Well managed on current medications ?Discussed diet, avoiding triggers and other lifestyle changes ? ?Morbid obesity ([place]) - BMI 30+ with OSA ?- long discussion about weight loss, diet, and exercise ?-recommended diet heavy in fruits and veggies and low in animal meats, cheeses, and dairy products ? ?Chronic kidney disease stage 3b (H[facility]) ?Significant deterioration over the last year, was referred to nephrology, patient reports was see with improved functions but no report received, will request.  ?Increase fluids, avoid NSAIDS- monitor closely ?- recheck shows continued decline, will forward to nephrology for review and recommendations ? ?Anemia of chronic renal disease ?Has seen hematology; nephrology  ? ?Persistent cough ?Cough- multifactorial-   ?Continue PPI , take consistently,  ?Add antihistamine if needed ?Continue CPAP ?If not improved follow up ?Consider ENT evaluation for laryngoscopy  ?Also possible mild dysphagia leading to daytime cough with drinking/eating - consider speech pathology referral if cough not improving ? ?Prolonged QT ?New finding at CPE, follow up with established cardiology Dr. Acie Fredrickson ?Avoid QT prolonging meds ?? Due to keppra, she misunderstood, back on 1000 mg BID ?Recheck levels, reduce dose to 500 mg tabs if needed ? ?B12 deficiency ?- level was too high, clarify supplement dose at home to adjust ? ?AAA screening/ abdominal aortic aneurysm ([place]) ?- AAA [place] was + today at 3.7 cm ?- will confirm with vascular [facility], then [facility]  ?- control BP ? ? ? ?Orders Placed This Encounter  ?Procedures  ? CBC with Differential/Platelet  ? COMPLETE METABOLIC PANEL WITH GFR  ? Magnesium  ? Lipid panel  ? TSH  ?  Levetiracetam, Immunoassay  ? [place], [employer]  L[employer]  ? VAS [place] AAA DUPLEX   ? ?Discussed med's effects and SE's. Screening labs and tests as requested with regular follow-up as recommended. ?Over 40 minutes of exam, counseling, chart review, and complex, high level critical decision making was performed this visit.  ?Future Appointments  ?Date Time Provider [place]  ?09/07/2021  9:30 AM Unk Pinto, MD [facility] None  ?01/26/2022 10:00 AM Liane Comber, NP [facility] None  ?05/29/2022 10:00 AM Darrol Jump, NP [facility] None  ? ? ?Plan:  ? ?During the course of the visit the patient was educated and counseled about appropriate screening and preventive services including:  ? ?Pneumococcal vaccine  ?Prevnar 13 ?Influenza vaccine ?Td vaccine ?Screening electrocardiogram ?Bone densitometry screening ?Colorectal cancer screening ?Diabetes screening ?Glaucoma screening ?Nutrition counseling  ?Advanced directives: requested ? ? ?HPI  ?66 y.o. female  presents for AWV and 3 month follow up. She has Essential hypertension; Mitral regurgitation; Chronic diastolic heart failure ([place]); OSA on CPAP; Vitamin D deficiency; Hyperlipidemia; Brain aneurysm; Abnormal glucose; Medication management; Morbid obesity ([place]) - BMI 30+ with OSA; Gout; Gastroesophageal reflux disease; Anemia of chronic disease- renal; Allergic rhinitis; Persistent cough; CKD stage G3b/A2, GFR 30-44 and albumin creatinine ratio 30-299 mg/g ([place]); History of anemia due to chronic kidney disease; Nocturia more than twice per night; Chronic spontaneous subarach intracran bleed due to cerebral aneurysm ([place]); Seizure disorder ([place]); Severe obstructive sleep apnea-hypopnea syndrome; Prolonged Q-T interval on ECG; B12 deficiency; and Abdominal aortic aneurysm (AAA) without rupture ([place]) on their problem list. ? ?She reports husband passed in Dec 04 2020 after hospice, she reports is doing fairly. Family supports, 2 boys, 4 granddaughters, 1 great grandson.  ?She works at [employer], going to work part  time- 4 hours 2-3 days a week.  ? ?She was admitted 01/2017 for left ICA aneursym for a pipeline stent but had a seizure and AMS after her procedure, she is on brilinta, she was following with Dr. Corena Pilgrim but was released as no seizures since that time, continues on keppra, today states taking 1000 mg BID despite elevation and recommendations to reduce at last visit. Mas mild residual R arm weakness and some spasticity. Had follow up MRI 04/30/2020 which showed essentially unchanged, stable chronic microvascular ischemia, redemonstrated punctate chronic microhemorrhage. ? ?She was on CPAP since 2013, had at [place] [facility], was doing well but recently CPAP machine was recalled, needing replacement. She was referred to Dr. Brett Fairy, recent sleep study 04/2020 showed severe OSA with REM hypoxia, AHI was 41.8/h, recommended CPAP at 16 cm. CPAP finally arrived, pending supplies.  ? ?She has reported persistent dry cough, triggered by inhalation, drinking water or any food intake, but notably worse at night. She reports coughing with drinking or food is ongoing since her aneurysm/stroke/seizure, but has noted worse coughing at night. Was started on famotidine and PPI, reduced caffeine with some improvement.  ? ?She is on estrogen pills for hot flashes, had been taking premarin 0.9 mg for over 10 years, last visit reduced to 0.45 mg, reports didn't pick up due to $190 cost and has been off. States only has hot flashes when outdoors in the heat.  ? ?BMI is Body mass index is 33.29 kg/m?., she has been working on diet and exercise. Weight up after recent steroid taper.  ?Wt Readings from Last 3 Encounters:  ?06/01/21 232 lb (105.2 kg)  ?05/13/21 229 lb 6.4 oz (104.1 kg)  ?04/20/21 229 lb 3.2 oz (  104 kg)  ? ?PVD and CHF MR, follows with Dr. Sondra Come.  Had ECHO 07/2019 showing normal LV function, Mod MR, mass in the RA, but follow up cardiac MRI on 10/02/2019 showed no evidence of atrial mass.  ? ?Her blood pressure has been  controlled at home, states has been good at home, today their BP is BP: 136/78 ?She does workout, walks daily for 30 mins. She denies chest pain, shortness of breath, dizziness.  ? ?She is not on cholesterol medication and d

## 2021-06-02 ENCOUNTER — Other Ambulatory Visit: Payer: Self-pay | Admitting: Adult Health

## 2021-06-02 DIAGNOSIS — E785 Hyperlipidemia, unspecified: Secondary | ICD-10-CM

## 2021-06-02 MED ORDER — ROSUVASTATIN CALCIUM 5 MG PO TABS: ORAL_TABLET | ORAL | 3 refills | Status: AC

## 2021-06-03 ENCOUNTER — Other Ambulatory Visit: Payer: Self-pay | Admitting: Adult Health

## 2021-06-03 DIAGNOSIS — N1832 Chronic kidney disease, stage 3b: Secondary | ICD-10-CM

## 2021-06-03 DIAGNOSIS — D638 Anemia in other chronic diseases classified elsewhere: Secondary | ICD-10-CM

## 2021-06-03 DIAGNOSIS — N289 Disorder of kidney and ureter, unspecified: Secondary | ICD-10-CM

## 2021-06-04 LAB — COMPLETE METABOLIC PANEL WITH GFR
AG Ratio: 1.4 (calc) (ref 1.0–2.5)
ALT: 10 U/L (ref 6–29)
AST: 14 U/L (ref 10–35)
Albumin: 4.5 g/dL (ref 3.6–5.1)
Alkaline phosphatase (APISO): 77 U/L (ref 37–153)
BUN/Creatinine Ratio: 19 (calc) (ref 6–22)
BUN: 33 mg/dL — ABNORMAL HIGH (ref 7–25)
CO2: 23 mmol/L (ref 20–32)
Calcium: 10.2 mg/dL (ref 8.6–10.4)
Chloride: 106 mmol/L (ref 98–110)
Creat: 1.72 mg/dL — ABNORMAL HIGH (ref 0.50–1.05)
Globulin: 3.2 g/dL (calc) (ref 1.9–3.7)
Glucose, Bld: 88 mg/dL (ref 65–99)
Potassium: 5.1 mmol/L (ref 3.5–5.3)
Sodium: 139 mmol/L (ref 135–146)
Total Bilirubin: 0.3 mg/dL (ref 0.2–1.2)
Total Protein: 7.7 g/dL (ref 6.1–8.1)
eGFR: 33 mL/min/{1.73_m2} — ABNORMAL LOW (ref >=60)

## 2021-06-04 LAB — CBC WITH DIFFERENTIAL/PLATELET
Absolute Monocytes: 462 cells/uL (ref 200–950)
Basophils Absolute: 57 cells/uL (ref 0–200)
Basophils Relative: 0.7 %
Eosinophils Absolute: 551 cells/uL — ABNORMAL HIGH (ref 15–500)
Eosinophils Relative: 6.8 %
HCT: 30.3 % — ABNORMAL LOW (ref 35.0–45.0)
Hemoglobin: 9.6 g/dL — ABNORMAL LOW (ref 11.7–15.5)
Lymphs Abs: 3434 cells/uL (ref 850–3900)
MCH: 25.9 pg — ABNORMAL LOW (ref 27.0–33.0)
MCHC: 31.7 g/dL — ABNORMAL LOW (ref 32.0–36.0)
MCV: 81.9 fL (ref 80.0–100.0)
MPV: 10.1 fL (ref 7.5–12.5)
Monocytes Relative: 5.7 %
Neutro Abs: 3596 cells/uL (ref 1500–7800)
Neutrophils Relative %: 44.4 %
Platelets: 323 10*3/uL (ref 140–400)
RBC: 3.7 10*6/uL — ABNORMAL LOW (ref 3.80–5.10)
RDW: 17.1 % — ABNORMAL HIGH (ref 11.0–15.0)
Total Lymphocyte: 42.4 %
WBC: 8.1 10*3/uL (ref 3.8–10.8)

## 2021-06-04 LAB — LIPID PANEL
Cholesterol: 225 mg/dL — ABNORMAL HIGH (ref <200)
HDL: 95 mg/dL (ref >=50)
LDL Cholesterol (Calc): 106 mg/dL (calc) — ABNORMAL HIGH
Non-HDL Cholesterol (Calc): 130 mg/dL (calc) — ABNORMAL HIGH (ref <130)
Total CHOL/HDL Ratio: 2.4 (calc) (ref <5.0)
Triglycerides: 126 mg/dL (ref <150)

## 2021-06-04 LAB — LEVETIRACETAM, IMMUNOASSAY: LEVETIRACETAM, IMMUNOASSAY: 84.1 ug/mL — ABNORMAL HIGH (ref 6.0–46.0)

## 2021-06-04 LAB — TSH: TSH: 3.67 mIU/L (ref 0.40–4.50)

## 2021-06-04 LAB — MAGNESIUM: Magnesium: 1.7 mg/dL (ref 1.5–2.5)

## 2021-06-06 ENCOUNTER — Other Ambulatory Visit: Payer: Self-pay | Admitting: Adult Health

## 2021-06-06 ENCOUNTER — Other Ambulatory Visit: Payer: Self-pay | Admitting: Internal Medicine

## 2021-06-06 DIAGNOSIS — G40909 Epilepsy, unspecified, not intractable, without status epilepticus: Secondary | ICD-10-CM

## 2021-06-06 MED ORDER — LEVETIRACETAM 500 MG PO TABS: 500.0000 mg | ORAL_TABLET | Freq: Two times a day (BID) | ORAL | 3 refills | Status: DC

## 2021-06-13 ENCOUNTER — Other Ambulatory Visit (HOSPITAL_COMMUNITY): Payer: Self-pay | Admitting: Interventional Radiology

## 2021-06-13 DIAGNOSIS — I671 Cerebral aneurysm, nonruptured: Secondary | ICD-10-CM

## 2021-06-22 ENCOUNTER — Ambulatory Visit (HOSPITAL_COMMUNITY)
Admission: RE | Admit: 2021-06-22 | Discharge: 2021-06-22 | Disposition: A | Discharge status: Home / Self Care | Payer: Medicare HMO | Source: Ambulatory Visit | Attending: Adult Health | Admitting: Adult Health

## 2021-06-22 DIAGNOSIS — I714 Abdominal aortic aneurysm, without rupture, unspecified: Secondary | ICD-10-CM | POA: Diagnosis not present

## 2021-06-24 ENCOUNTER — Other Ambulatory Visit: Payer: Self-pay

## 2021-06-24 DIAGNOSIS — G40909 Epilepsy, unspecified, not intractable, without status epilepticus: Secondary | ICD-10-CM

## 2021-06-25 ENCOUNTER — Other Ambulatory Visit: Payer: Self-pay | Admitting: Internal Medicine

## 2021-06-25 DIAGNOSIS — G40909 Epilepsy, unspecified, not intractable, without status epilepticus: Secondary | ICD-10-CM

## 2021-06-25 DIAGNOSIS — R1032 Left lower quadrant pain: Secondary | ICD-10-CM

## 2021-06-25 MED ORDER — LEVETIRACETAM 500 MG PO TABS: ORAL_TABLET | ORAL | 3 refills | Status: DC

## 2021-06-25 MED ORDER — DICLOFENAC SODIUM 75 MG PO TBEC: DELAYED_RELEASE_TABLET | ORAL | 3 refills | Status: DC

## 2021-06-29 ENCOUNTER — Encounter: Payer: Self-pay | Admitting: Adult Health

## 2021-06-29 ENCOUNTER — Ambulatory Visit (INDEPENDENT_AMBULATORY_CARE_PROVIDER_SITE_OTHER): Payer: Medicare HMO | Admitting: Adult Health

## 2021-06-29 VITALS — BP 142/84 | HR 83 | Temp 97.3°F | Wt 233.0 lb

## 2021-06-29 DIAGNOSIS — G40909 Epilepsy, unspecified, not intractable, without status epilepticus: Secondary | ICD-10-CM | POA: Diagnosis not present

## 2021-06-29 DIAGNOSIS — I10 Essential (primary) hypertension: Secondary | ICD-10-CM | POA: Diagnosis not present

## 2021-06-29 DIAGNOSIS — N1832 Chronic kidney disease, stage 3b: Secondary | ICD-10-CM

## 2021-06-29 NOTE — Progress Notes (Signed)
Assessment and Plan: Yolanda Espinoza was seen today for follow-up.  Diagnoses and all orders for this visit:  CKD stage G3b/A2, GFR 30-44 and albumin creatinine ratio 30-299 mg/g (HCC) Unclear etiology, renal functns trending down Already on max olmesartan/amlodipine Denies NSAID use, reports adequate water intake Pending nephrology evaluation Recheck renals today pending -  -     BASIC METABOLIC PANEL WITH GFR  Seizure disorder (HCC) Currently OFF of keppra - re-evaluate and adjust levels accordingly Dr. Estanislado Pandy follows for aneurysms, pending MRI tomorrow -  Emphasized BP control -     Levetiracetam, Immunoassay  Essential hypertension Continue medications, typically controlled in office, reports good control per home logs Monitor blood pressure at home; call if consistently over 130/80 Reminded DASH diet.   Reminder to go to the ER if any CP, SOB, nausea, dizziness, severe HA, changes vision/speech, left arm numbness and tingling and jaw pain.   Further disposition pending results of labs. Discussed med's effects and SE's.   Over 20 minutes of exam, counseling, chart review, and critical decision making was performed.   Future Appointments  Date Time Provider [place]  06/30/2021  4:00 PM MC-MR 1 [facility]  06/30/2021  5:00 PM MC-MR 1 MC[facility]MRI [facility]  09/07/2021  9:30 AM Unk Pinto, MD [facility] None  01/26/2022 10:00 AM Liane Comber, NP [facility] None  05/29/2022 10:00 AM Darrol Jump, NP [facility] None    ------------------------------------------------------------------------------------------------------------------   HPI BP (!) 142/84   Pulse 83   Temp (!) 97.3 F (36.3 C)   Wt 233 lb (105.7 kg)   SpO2 97%   BMI 33.43 kg/m  65 y.o.female with hx of aneurysms with seizure (follows Dr. Estanislado Pandy, pending MRI follow up tomorrow) presents for follow up on renal deterioration and keppra follow up.   She also reports has received CPAP machine since last visit,  has seen Dr. Brett Fairy, previous chronic cough seems well controlled as long as she uses CPAP machine correctly/consistently.   Her blood pressure has been controlled at home (130s/80s), today their BP is BP: (!) 142/84 BP Readings from Last 3 Encounters:  06/29/21 (!) 142/84  06/01/21 136/78  05/13/21 132/82    She does not workout. She denies chest pain, shortness of breath, dizziness.  Renal functions have been declining, pending nephrology evaluation by [facility], had to miss 2 appointments, pending reschedule- patient reports hasn't been contacted. She denies NSAID use, reports has stopped sodas, drinking 5-6 bottles of water daily.  Lab Results  Component Value Date   EGFR 33 (L) 06/01/2021   EGFR 36 (L) 05/13/2021   EGFR 44 (L) 01/26/2021   Lab Results  Component Value Date   CREATININE 1.72 (H) 06/01/2021   CREATININE 1.57 (H) 05/13/2021   CREATININE 1.34 (H) 01/26/2021   She is on olmesartan 40 mg, amlodipine 10 mg daily -  Lab Results  Component Value Date   MICRALBCREAT 285 (H) 01/26/2021   MICRALBCREAT 143 (H) 01/26/2020   MICRALBCREAT 94 (H) 01/23/2019   She reports after last visit STOPPED keppra; previously was taking 1000 mg BID, had recommended she reduce to 500 mg BID. She has hx of seizure with aneurysm rupture/surgery, denies seizure episodes since then.  Component     Latest Ref Rng 09/29/2020 01/26/2021 02/08/2021  LEVETIRACETAM, IMMUNOASSAY     6.0 - 46.0 mcg/mL 37.7  87.6 (H)  69.8 (H)    Component     Latest Ref Rng 06/01/2021  LEVETIRACETAM, IMMUNOASSAY     6.0 - 46.0  mcg/mL 84.1 (H)     BMI is Body mass index is 33.43 kg/m.,  Wt Readings from Last 3 Encounters:  06/29/21 233 lb (105.7 kg)  06/01/21 232 lb (105.2 kg)  05/13/21 229 lb 6.4 oz (104.1 kg)    Past Medical History:  Diagnosis Date   Anemia    Arthritis    Chronic diastolic heart failure (HCC)    GERD (gastroesophageal reflux disease)    Heart murmur    Hyperlipidemia     Hypertension may, 1973   Mitral regurgitation    a. Echo 01/2012 mild LVH, EF 55-65%, Gr 2 DD, mod MR, mild LAE, PASP 36;  b. Echo (02/2013):  EF 55-60%, Gr 1 DD, mild to mod MR, mild LAE (no sig change since prior echo) // c. Echo 10/18: EF 55-60, normal wall motion, grade 2 diastolic dysfunction, trivial AI, mild to moderate MR, mild LAE, trivial PI, PASP 38     OSA on CPAP    7 yrs   Seizures ([place]) 01/2017   Stroke ([place]) 07   no weakness jan 29th 2019 no defecits   Subarachnoid hemorrhage due to ruptured aneurysm ([place])    2003 - s/p coiling   Unspecified vitamin D deficiency      Allergies  Allergen Reactions   Promethazine-Dm     Palpitations   Ace Inhibitors Nausea And Vomiting   Aspirin Nausea And Vomiting    Can take coated Aspirin    Current Outpatient Medications on File Prior to Visit  Medication Sig   allopurinol (ZYLOPRIM) 100 MG tablet Take 1 tablet  Daily to Prevent Gout   amLODipine (NORVASC) 10 MG tablet TAKE ONE TABLET BY MOUTH DAILY AT 9 AM FOR BLOOD PRESSURE   aspirin EC 81 MG tablet Take 1 tablet (81 mg total) by mouth daily.   cholecalciferol (VITAMIN D3) 25 MCG (1000 UT) tablet Take 1,000 Units by mouth daily.   cyclobenzaprine (FLEXERIL) 10 MG tablet Take 1 tablet at Bedtime as Needed for Muscle Spasms                                                /                   TAKE                              BY                           MOUTH   diclofenac (VOLTAREN) 75 MG EC tablet Take 1 tablet   2 x /day  with food for Pain & Inflammation   famotidine (PEPCID) 20 MG tablet TAKE ONE TABLET BY MOUTH DAILY AT 9 AM EVERY MORNING, 30-60 MINUTES PRIOR TO BREAKFAST FOR REFLUX.   fexofenadine (ALLEGRA ALLERGY) 180 MG tablet Take 1 tablet (180 mg total) by mouth daily.   furosemide (LASIX) 40 MG tablet Take 1 tablet Daily for BP, Fluid Retention & Ankle Swelling   levETIRAcetam (KEPPRA) 500 MG tablet Take  1 tablet   2 x /day (every 12 hours)  to Prevent Seizures.   Multiple  Vitamins-Minerals (MULTIVITAMIN WITH MINERALS) tablet Take 1 tablet by mouth daily.   olmesartan (BENICAR) 40 MG tablet Take one  tablet daily for Blood Pressure.   pantoprazole (PROTONIX) 40 MG tablet Take one tablet at bedtime   rosuvastatin (CRESTOR) 5 MG tablet Take 1 tab three days a week (MWF) for cholesterol.   ticagrelor (BRILINTA) 90 MG TABS tablet Take 1 tablet  2 x /day (every 12 hours )  to prevent Blood Clots   No current facility-administered medications on file prior to visit.    ROS: all negative except above.   Physical Exam:  BP (!) 142/84   Pulse 83   Temp (!) 97.3 F (36.3 C)   Wt 233 lb (105.7 kg)   SpO2 97%   BMI 33.43 kg/m   General Appearance: Well nourished, in no apparent distress. Eyes: PERRLA, conjunctiva no swelling or erythema ENT/Mouth: mask in place; Hearing normal.  Neck: Supple, thyroid normal.  Respiratory: Respiratory effort normal, BS equal bilaterally without rales, rhonchi, wheezing or stridor.  Cardio: RRR with no MRGs. Brisk peripheral pulses without edema.  Abdomen: Soft, + BS.  Non tender, no guarding, rebound, hernias, masses. Lymphatics: Non tender without lymphadenopathy.  Musculoskeletal: no obvious deformity; normal gait.  Skin: Warm, dry without rashes, lesions, ecchymosis.  Neuro: Normal muscle tone Psych: Awake and oriented X 3, normal affect, Insight and Judgment appropriate.     Izora Ribas, NP 10:29 AM [facility]

## 2021-06-30 ENCOUNTER — Ambulatory Visit (HOSPITAL_COMMUNITY)
Admission: RE | Admit: 2021-06-30 | Discharge: 2021-06-30 | Disposition: A | Discharge status: Home / Self Care | Payer: Medicare HMO | Source: Ambulatory Visit | Attending: Interventional Radiology | Admitting: Interventional Radiology

## 2021-06-30 ENCOUNTER — Other Ambulatory Visit: Payer: Self-pay | Admitting: Adult Health

## 2021-06-30 DIAGNOSIS — I720 Aneurysm of carotid artery: Secondary | ICD-10-CM | POA: Diagnosis not present

## 2021-06-30 DIAGNOSIS — E875 Hyperkalemia: Secondary | ICD-10-CM

## 2021-06-30 DIAGNOSIS — I671 Cerebral aneurysm, nonruptured: Secondary | ICD-10-CM | POA: Diagnosis not present

## 2021-06-30 DIAGNOSIS — R9082 White matter disease, unspecified: Secondary | ICD-10-CM | POA: Diagnosis not present

## 2021-07-02 LAB — BASIC METABOLIC PANEL WITH GFR
BUN/Creatinine Ratio: 21 (calc) (ref 6–22)
BUN: 38 mg/dL — ABNORMAL HIGH (ref 7–25)
CO2: 24 mmol/L (ref 20–32)
Calcium: 10.5 mg/dL — ABNORMAL HIGH (ref 8.6–10.4)
Chloride: 108 mmol/L (ref 98–110)
Creat: 1.79 mg/dL — ABNORMAL HIGH (ref 0.50–1.05)
Glucose, Bld: 85 mg/dL (ref 65–99)
Potassium: 5.9 mmol/L — ABNORMAL HIGH (ref 3.5–5.3)
Sodium: 141 mmol/L (ref 135–146)
eGFR: 31 mL/min/{1.73_m2} — ABNORMAL LOW (ref >=60)

## 2021-07-02 LAB — LEVETIRACETAM, IMMUNOASSAY: LEVETIRACETAM, IMMUNOASSAY: <2 ug/mL — ABNORMAL LOW (ref 6.0–46.0)

## 2021-07-04 ENCOUNTER — Telehealth (HOSPITAL_COMMUNITY): Payer: Self-pay

## 2021-07-04 ENCOUNTER — Telehealth: Payer: Self-pay | Admitting: Student

## 2021-07-04 NOTE — Progress Notes (Signed)
Faxed updated labs to [facility]. Practice states they will call to speak directly with patient when scheduling.

## 2021-07-04 NOTE — Telephone Encounter (Signed)
Patient is known to [facility] Dr. Estanislado Pandy, s/p Lt ICA  para ophthalmic aneurysms pipeline flow diverter placement  on 02/21/2017.   Patient has been followed by annual MR/MRA.  Most recent MR/MRA on 07/02/21 reviewed with Dr. Estanislado Pandy.   The aneurysm appears to be stable, if patient asymptomatic, Brilinta can be discontinued per Dr. Estanislado Pandy.  Will continue to follow up with annual MRA.   Patient was called twice to discuss follow up no answer, left VM with call back number.    Yolanda Gang Madilyn Cephas PA-C 07/04/2021 2:17 PM

## 2021-07-04 NOTE — Telephone Encounter (Signed)
Pt agreed to f/u in 1 year with an mra. AW  

## 2021-07-05 ENCOUNTER — Telehealth: Payer: Self-pay | Admitting: Physician Assistant

## 2021-07-05 NOTE — Telephone Encounter (Signed)
Patient returned call today to discuss imaging results (see telephone encounter from 07/04/21 by Durenda Guthrie, PA-C)/  Patient denies any symptoms, she is interested in discontinuing Brilinta and agreeable to 1 MRA follow up.   She was encouraged to call with any questions or concerns.  Candiss Norse, PA-C

## 2021-07-06 ENCOUNTER — Ambulatory Visit (INDEPENDENT_AMBULATORY_CARE_PROVIDER_SITE_OTHER): Payer: Medicare HMO

## 2021-07-06 DIAGNOSIS — E875 Hyperkalemia: Secondary | ICD-10-CM

## 2021-07-06 NOTE — Progress Notes (Signed)
Patient presents to the office for a nurse visit to have labs done to recheck kidney function. Patient states that she drinks 4 Core water bottles a day, between 20-30 oz a day.

## 2021-07-07 ENCOUNTER — Emergency Department (HOSPITAL_COMMUNITY)
Admission: EM | Admit: 2021-07-07 | Discharge: 2021-07-07 | Disposition: A | Discharge status: Home / Self Care | Payer: Medicare HMO | Attending: Emergency Medicine | Admitting: Emergency Medicine

## 2021-07-07 ENCOUNTER — Other Ambulatory Visit: Payer: Self-pay

## 2021-07-07 ENCOUNTER — Encounter (HOSPITAL_COMMUNITY): Payer: Self-pay

## 2021-07-07 ENCOUNTER — Encounter: Payer: Self-pay | Admitting: Adult Health

## 2021-07-07 ENCOUNTER — Telehealth: Payer: Self-pay | Admitting: Adult Health

## 2021-07-07 ENCOUNTER — Other Ambulatory Visit: Payer: Self-pay | Admitting: Adult Health

## 2021-07-07 DIAGNOSIS — I129 Hypertensive chronic kidney disease with stage 1 through stage 4 chronic kidney disease, or unspecified chronic kidney disease: Secondary | ICD-10-CM | POA: Diagnosis not present

## 2021-07-07 DIAGNOSIS — N189 Chronic kidney disease, unspecified: Secondary | ICD-10-CM | POA: Insufficient documentation

## 2021-07-07 DIAGNOSIS — E875 Hyperkalemia: Secondary | ICD-10-CM

## 2021-07-07 DIAGNOSIS — D631 Anemia in chronic kidney disease: Secondary | ICD-10-CM | POA: Diagnosis not present

## 2021-07-07 DIAGNOSIS — R799 Abnormal finding of blood chemistry, unspecified: Secondary | ICD-10-CM | POA: Diagnosis present

## 2021-07-07 DIAGNOSIS — N289 Disorder of kidney and ureter, unspecified: Secondary | ICD-10-CM | POA: Insufficient documentation

## 2021-07-07 DIAGNOSIS — Z7982 Long term (current) use of aspirin: Secondary | ICD-10-CM | POA: Insufficient documentation

## 2021-07-07 LAB — CBC WITH DIFFERENTIAL/PLATELET
Abs Immature Granulocytes: 0.04 10*3/uL (ref 0.00–0.07)
Basophils Absolute: 0.1 10*3/uL (ref 0.0–0.1)
Basophils Relative: 1 %
Eosinophils Absolute: 0.4 10*3/uL (ref 0.0–0.5)
Eosinophils Relative: 5 %
HCT: 29.7 % — ABNORMAL LOW (ref 36.0–46.0)
Hemoglobin: 8.9 g/dL — ABNORMAL LOW (ref 12.0–15.0)
Immature Granulocytes: 0 %
Lymphocytes Relative: 35 %
Lymphs Abs: 3.1 10*3/uL (ref 0.7–4.0)
MCH: 26.2 pg (ref 26.0–34.0)
MCHC: 30 g/dL (ref 30.0–36.0)
MCV: 87.4 fL (ref 80.0–100.0)
Monocytes Absolute: 0.5 10*3/uL (ref 0.1–1.0)
Monocytes Relative: 6 %
Neutro Abs: 4.7 10*3/uL (ref 1.7–7.7)
Neutrophils Relative %: 53 %
Platelets: 324 10*3/uL (ref 150–400)
RBC: 3.4 MIL/uL — ABNORMAL LOW (ref 3.87–5.11)
RDW: 16.9 % — ABNORMAL HIGH (ref 11.5–15.5)
WBC: 8.9 10*3/uL (ref 4.0–10.5)
nRBC: 0 % (ref 0.0–0.2)

## 2021-07-07 LAB — I-STAT CHEM 8, ED
BUN: 31 mg/dL — ABNORMAL HIGH (ref 8–23)
Calcium, Ion: 1.27 mmol/L (ref 1.15–1.40)
Chloride: 106 mmol/L (ref 98–111)
Creatinine, Ser: 2.1 mg/dL — ABNORMAL HIGH (ref 0.44–1.00)
Glucose, Bld: 88 mg/dL (ref 70–99)
HCT: 31 % — ABNORMAL LOW (ref 36.0–46.0)
Hemoglobin: 10.5 g/dL — ABNORMAL LOW (ref 12.0–15.0)
Potassium: 6.1 mmol/L — ABNORMAL HIGH (ref 3.5–5.1)
Sodium: 134 mmol/L — ABNORMAL LOW (ref 135–145)
TCO2: 22 mmol/L (ref 22–32)

## 2021-07-07 LAB — COMPREHENSIVE METABOLIC PANEL
ALT: 11 U/L (ref 0–44)
AST: 19 U/L (ref 15–41)
Albumin: 3.9 g/dL (ref 3.5–5.0)
Alkaline Phosphatase: 58 U/L (ref 38–126)
Anion gap: 7 (ref 5–15)
BUN: 31 mg/dL — ABNORMAL HIGH (ref 8–23)
CO2: 22 mmol/L (ref 22–32)
Calcium: 9.5 mg/dL (ref 8.9–10.3)
Chloride: 105 mmol/L (ref 98–111)
Creatinine, Ser: 2.01 mg/dL — ABNORMAL HIGH (ref 0.44–1.00)
GFR, Estimated: 27 mL/min — ABNORMAL LOW (ref >60)
Glucose, Bld: 93 mg/dL (ref 70–99)
Potassium: 6 mmol/L — ABNORMAL HIGH (ref 3.5–5.1)
Sodium: 134 mmol/L — ABNORMAL LOW (ref 135–145)
Total Bilirubin: 0.3 mg/dL (ref 0.3–1.2)
Total Protein: 7.6 g/dL (ref 6.5–8.1)

## 2021-07-07 LAB — BASIC METABOLIC PANEL WITH GFR
BUN/Creatinine Ratio: 18 (calc) (ref 6–22)
BUN: 32 mg/dL — ABNORMAL HIGH (ref 7–25)
CO2: 24 mmol/L (ref 20–32)
Calcium: 10.2 mg/dL (ref 8.6–10.4)
Chloride: 108 mmol/L (ref 98–110)
Creat: 1.82 mg/dL — ABNORMAL HIGH (ref 0.50–1.05)
Glucose, Bld: 88 mg/dL (ref 65–99)
Potassium: 7.1 mmol/L (ref 3.5–5.3)
Sodium: 140 mmol/L (ref 135–146)
eGFR: 30 mL/min/{1.73_m2} — ABNORMAL LOW (ref >=60)

## 2021-07-07 MED ORDER — SODIUM ZIRCONIUM CYCLOSILICATE 10 G PO PACK
10.0000 g | PACK | Freq: Every day | ORAL | Status: AC
Start: 2021-07-07 — End: 2021-07-07
  Administered 2021-07-07: 10 g via ORAL
  Filled 2021-07-07: qty 1

## 2021-07-07 MED ORDER — LOKELMA 10 G PO PACK: 10.0000 g | PACK | Freq: Every day | ORAL | 0 refills | Status: AC

## 2021-07-07 MED ORDER — SODIUM ZIRCONIUM CYCLOSILICATE 10 G PO PACK
10.0000 g | PACK | Freq: Every day | ORAL | Status: DC
Start: 2021-07-07 — End: 2021-07-07
  Filled 2021-07-07: qty 1

## 2021-07-07 MED ORDER — SODIUM ZIRCONIUM CYCLOSILICATE 10 G PO PACK
10.0000 g | PACK | Freq: Every day | ORAL | Status: DC
  Filled 2021-07-07: qty 1

## 2021-07-07 NOTE — ED Triage Notes (Addendum)
Reports has blood drawn yesterday and they called her this am with a K >7 and told her to come to ER.  Has not other complaints at this time. Labs show K was 5.9 8 days ago and AKI vs CKD and then yesterday K was 7.1 with worsening kidney function

## 2021-07-07 NOTE — Telephone Encounter (Signed)
Spoke with patient to advise potassium trend up despite hydrated recheck, now 7.1, deteriorating kidneys, recommended ED evaluation for recheck and treatment as needed. She expresses understanding, intent to present to [facility] ED.

## 2021-07-07 NOTE — ED Provider Triage Note (Signed)
Emergency Medicine Provider Triage Evaluation Note  BRIENA SWINGLER , a 66 y.o. female  was evaluated in triage.  Pt complains of abnormal labs.  Patient had lab work drawn yesterday which showed potassium at 7.1 patient was advised to come to the emergency department today for further evaluation.  Review of Systems  Positive:  Negative: Chest pain, shortness of breath, palpitations, lightheadedness, syncope, dysuria, hematuria, urinary urgency, difficulty urinating, flank pain, abdominal pain  Physical Exam  BP (!) 163/81 (BP Location: Right Arm)   Pulse 85   Temp 97.7 F (36.5 C) (Oral)   Resp 18   Ht 5\' 10"  (1.778 m)   Wt 105.7 kg   SpO2 100%   BMI 33.43 kg/m  Gen:   Awake, no distress   Resp:  Normal effort  MSK:   Moves extremities without difficulty  Other:    Medical Decision Making  Medically screening exam initiated at 10:18 AM.  Appropriate orders placed.  Marica Otter was informed that the remainder of the evaluation will be completed by another provider, this initial triage assessment does not replace that evaluation, and the importance of remaining in the ED until their evaluation is complete.  We will recheck labs, patient moved back to next level room.   Loni Beckwith, PA-C 07/07/21 1019

## 2021-07-07 NOTE — Discharge Instructions (Addendum)
STOP your Benicar  Take Yolanda Espinoza today (given in the ER), tomorrow and Saturday. Nephrology will schedule follow up for next week.  Return to the ER for any concerning symptoms.

## 2021-07-07 NOTE — ED Provider Notes (Signed)
[facility] EMERGENCY DEPARTMENT Provider Note   CSN: [id number] Arrival date & time: 07/07/21  1000     History  Chief Complaint  Patient presents with   Abnormal Lab    Yolanda Espinoza is a 66 y.o. female.  66 year old female presents emergency room at direction of her PCP after she was found to have a potassium of 7.1 on lab draw yesterday.  Patient states that this was routine lab work done as part of her annual physical however on chart review, is found to be a nurse visit recheck labs for uptrending creatinine and potassium.  Patient denies any changes in her medications recently, she is feeling well and prefer not to be in the emergency room today.       Home Medications Prior to Admission medications   Medication Sig Start Date End Date Taking? Authorizing Provider  allopurinol (ZYLOPRIM) 100 MG tablet Take 1 tablet  Daily to Prevent Gout 04/11/21  Yes Unk Pinto, MD  amLODipine (NORVASC) 10 MG tablet TAKE ONE TABLET BY MOUTH DAILY AT 9 AM FOR BLOOD PRESSURE 04/13/21  Yes Liane Comber, NP  aspirin EC 81 MG tablet Take 1 tablet (81 mg total) by mouth daily. 03/11/15  Yes Nahser, Wonda Cheng, MD  cholecalciferol (VITAMIN D3) 25 MCG (1000 UT) tablet Take 1,000 Units by mouth daily.   Yes [provider]  cyclobenzaprine (FLEXERIL) 10 MG tablet Take 1 tablet at Bedtime as Needed for Muscle Spasms                                                /                   TAKE                              BY                           MOUTH 05/28/21  Yes Unk Pinto, MD  famotidine (PEPCID) 20 MG tablet TAKE ONE TABLET BY MOUTH DAILY AT 9 AM EVERY MORNING, 30-60 MINUTES PRIOR TO BREAKFAST FOR REFLUX. 04/01/21  Yes Liane Comber, NP  fexofenadine (ALLEGRA ALLERGY) 180 MG tablet Take 1 tablet (180 mg total) by mouth daily. 12/26/19  Yes Liane Comber, NP  furosemide (LASIX) 40 MG tablet Take 1 tablet Daily for BP, Fluid Retention & Ankle Swelling 11/14/18  Yes  Unk Pinto, MD  levETIRAcetam (KEPPRA) 500 MG tablet Take  1 tablet   2 x /day (every 12 hours)  to Prevent Seizures. 06/25/21  Yes Unk Pinto, MD  Multiple Vitamins-Minerals (MULTIVITAMIN WITH MINERALS) tablet Take 1 tablet by mouth daily.   Yes [provider]  sodium zirconium cyclosilicate (LOKELMA) 10 g PACK packet Take 10 g by mouth daily for 2 days. 07/07/21 07/09/21 Yes Tacy Learn, PA-C  pantoprazole (PROTONIX) 40 MG tablet Take one tablet at bedtime Patient not taking: Reported on 07/07/2021 04/01/21   Liane Comber, NP  rosuvastatin (CRESTOR) 5 MG tablet Take 1 tab three days a week (MWF) for cholesterol. Patient not taking: Reported on 07/07/2021 06/02/21   Liane Comber, NP  ticagrelor (BRILINTA) 90 MG TABS tablet Take 1 tablet  2 x /day (every 12 hours )  to prevent Blood Clots Patient not taking: Reported on 07/07/2021 10/12/20   Liane Comber, NP      Allergies    Promethazine-dm, Ace inhibitors, and Aspirin    Review of Systems   Review of Systems Negative except as per HPI Physical Exam Updated Vital Signs BP (!) 159/92   Pulse 77   Temp 97.7 F (36.5 C) (Oral)   Resp 14   Ht 5\' 10"  (1.778 m)   Wt 105.7 kg   SpO2 98%   BMI 33.43 kg/m  Physical Exam Vitals and nursing note reviewed.  Constitutional:      General: She is not in acute distress.    Appearance: She is well-developed. She is not diaphoretic.  HENT:     Head: Normocephalic and atraumatic.  Cardiovascular:     Rate and Rhythm: Normal rate and regular rhythm.     Pulses: Normal pulses.     Heart sounds: Normal heart sounds.  Pulmonary:     Effort: Pulmonary effort is normal.     Breath sounds: Normal breath sounds.  Abdominal:     Palpations: Abdomen is soft.     Tenderness: There is no abdominal tenderness.  Musculoskeletal:     Right lower leg: No edema.     Left lower leg: No edema.  Skin:    General: Skin is warm and dry.     Findings: No erythema or rash.   Neurological:     Mental Status: She is alert and oriented to person, place, and time.  Psychiatric:        Behavior: Behavior normal.     ED Results / Procedures / Treatments   Labs (all labs ordered are listed, but only abnormal results are displayed) Labs Reviewed  COMPREHENSIVE METABOLIC PANEL - Abnormal; Notable for the following components:      Result Value   Sodium 134 (*)    Potassium 6.0 (*)    BUN 31 (*)    Creatinine, Ser 2.01 (*)    GFR, Estimated 27 (*)    All other components within normal limits  CBC WITH DIFFERENTIAL/PLATELET - Abnormal; Notable for the following components:   RBC 3.40 (*)    Hemoglobin 8.9 (*)    HCT 29.7 (*)    RDW 16.9 (*)    All other components within normal limits  I-STAT CHEM 8, ED - Abnormal; Notable for the following components:   Sodium 134 (*)    Potassium 6.1 (*)    BUN 31 (*)    Creatinine, Ser 2.10 (*)    Hemoglobin 10.5 (*)    HCT 31.0 (*)    All other components within normal limits    EKG EKG Interpretation  Date/Time:  Thursday July 07 2021 10:10:41 EDT Ventricular Rate:  87 PR Interval:  166 QRS Duration: 84 QT Interval:  340 QTC Calculation: 409 R Axis:   6 Text Interpretation: Normal sinus rhythm Septal infarct , age undetermined Abnormal ECG When compared with ECG of 22-Jan-2003 09:53, PREVIOUS ECG IS PRESENT no sig change from previous Confirmed by Charlesetta Shanks [phone]) on 07/07/2021 10:28:44 AM  Radiology No results found.  Procedures Procedures    Medications Ordered in ED Medications  sodium zirconium cyclosilicate (LOKELMA) packet 10 g (has no administration in time range)    ED Course/ Medical Decision Making/ A&P                           Medical  Decision Making  This patient presents to the ED for concern of elevated K on OP labs drawn yesterday, otherwise feeling well, this involves an extensive number of treatment options, and is a complaint that carries with it a high risk of  complications and morbidity.  The differential diagnosis includes AKI, renal failure, increased K intake/supplementation, medication cause of renal disease    Co morbidities that complicate the patient evaluation  CKD, CHF, GERD, hyperlipidemia, pretension, anemia, seizures, SAH due to ruptured aneurysm   Additional history obtained:  External records from outside source obtained and reviewed including recent visit to PCP dated 06/29/2021, concern for gradual uptrend of creatinine, pending evaluation with nephrology.  Patient was advised to increase water intake, reported that she had been drinking 4 core water bottles a day (between 20 and 30 ounces daily).  Labs were drawn yesterday and recheck, concern for potassium of 7.1 with continued increase in creatinine, patient was advised to present to the emergency room. Prior echo 08/20/19 EF 55-60%   Lab Tests:  I Ordered, and personally interpreted labs.  The pertinent results include: CBC with hemoglobin 8.9, on trend with prior.  CMP with potassium of 6.0 (not significantly changed from patient's June 10 5.9), creatinine gradual uptrend, currently 2.01.  Cardiac Monitoring: / EKG:  The patient was maintained on a cardiac monitor.  I personally viewed and interpreted the cardiac monitored which showed an underlying rhythm of: Sinus rhythm, rate 87, no significant from prior, specifically in relation to patient's hyperkalemia, no peak T waves   Consultations Obtained:  I requested consultation with the nephrologist, Dr. Hollie Salk,  and discussed lab and imaging findings as well as pertinent plan - they recommend: Lokelma 10 g today, tomorrow, Saturday.  Stop the Benicar.  Patient to be seen in the office next week.   Problem List / ED Course / Critical interventions / Medication management  66 year old female with chronic kidney disease, CHF, additional history as above [place] at recommendation of her PCP after potassium from lab draw in office  yesterday of 7.0 with increase in creatinine.  Patient denies complaints or concerns otherwise.  Patient's EKG is reviewed and without peaked T waves.  Patient was placed on monitor while awaiting her labs.  Does have potassium of 6.0 today with creatinine of 2.01.  This is a gradual increase although not significant change from prior on file from July 02, 2021.  Case was discussed with nephrology Dr. Hollie Salk who recommends treatment with [facility] x3 days, discontinuing Benicar and following up in the office next week.  Discussed with patient who is satisfied with this plan, did not want to be hospitalized.  Agrees to take the [facility], will give first dose prior to discharge with prescription sent to her pharmacy.  She understands she is to discontinue her Benicar.  Patient return to ER for any concerning symptoms. I ordered medication including Lokelma for hyperkalemia Reevaluation of the patient after these medicines showed that the patient  administered at time of discharge with plan for follow-up next week I have reviewed the patients home medicines and have made adjustments as needed   Social Determinants of Health:  Has PCP, has not followed up with nephrology as of yet   Test / Admission - Considered:  Considered admission however in consultation with nephrology, patient is stable for outpatient follow-up at this time.         Final Clinical Impression(s) / ED Diagnoses Final diagnoses:  Hyperkalemia  Chronic kidney disease, unspecified CKD  stage    Rx / DC Orders ED Discharge Orders          Ordered    sodium zirconium cyclosilicate (LOKELMA) 10 g PACK packet  Daily        07/07/21 1307              Tacy Learn, PA-C 07/07/21 1313    Charlesetta Shanks, MD 07/07/21 [phone]

## 2021-08-26 ENCOUNTER — Other Ambulatory Visit: Payer: Self-pay | Admitting: Nurse Practitioner

## 2021-09-07 ENCOUNTER — Ambulatory Visit (INDEPENDENT_AMBULATORY_CARE_PROVIDER_SITE_OTHER): Payer: Medicare PPO | Admitting: Internal Medicine

## 2021-09-07 ENCOUNTER — Encounter: Payer: Self-pay | Admitting: Internal Medicine

## 2021-09-07 VITALS — BP 140/90 | HR 90 | Temp 98.0°F | Resp 17 | Ht 70.0 in | Wt 233.6 lb

## 2021-09-07 DIAGNOSIS — I10 Essential (primary) hypertension: Secondary | ICD-10-CM | POA: Diagnosis not present

## 2021-09-07 DIAGNOSIS — N184 Chronic kidney disease, stage 4 (severe): Secondary | ICD-10-CM | POA: Diagnosis not present

## 2021-09-07 DIAGNOSIS — R7309 Other abnormal glucose: Secondary | ICD-10-CM

## 2021-09-07 DIAGNOSIS — M1000 Idiopathic gout, unspecified site: Secondary | ICD-10-CM | POA: Diagnosis not present

## 2021-09-07 DIAGNOSIS — I129 Hypertensive chronic kidney disease with stage 1 through stage 4 chronic kidney disease, or unspecified chronic kidney disease: Secondary | ICD-10-CM

## 2021-09-07 DIAGNOSIS — Z79899 Other long term (current) drug therapy: Secondary | ICD-10-CM | POA: Diagnosis not present

## 2021-09-07 DIAGNOSIS — E559 Vitamin D deficiency, unspecified: Secondary | ICD-10-CM

## 2021-09-07 DIAGNOSIS — G40909 Epilepsy, unspecified, not intractable, without status epilepticus: Secondary | ICD-10-CM | POA: Diagnosis not present

## 2021-09-07 DIAGNOSIS — E782 Mixed hyperlipidemia: Secondary | ICD-10-CM

## 2021-09-07 NOTE — Patient Instructions (Signed)

## 2021-09-07 NOTE — Progress Notes (Signed)
Future Appointments  Date Time Provider Department  09/07/2021  9:30 AM Unk Pinto, MD [facility]  01/26/2022 10:00 AM Darrol Jump, NP [facility]  05/29/2022 10:00 AM Darrol Jump, NP [facility]    History of Present Illness:       This very nice 66 y.o. MBF  presents for 3 month follow up with HTN, HLD, Pre-Diabetes and Vitamin D Deficiency.        Patient is treated for HTN  since age 9 in 19 & BP has been controlled at home. Today's BP: (!) 140/90. She has seen Dr Cathie Olden for a Heart Murmer - Mod MR by 2D Echo and she had a Negative Cardiolite in Nov 2014.  Patient has CKD4  (GFR 27) presumed consequent of her HTVD.   In June , patient was sent to ER for an elevated Potassium of 7.1 and was repeated at 6.0 . Nephrology consultation advised 3 doses of Lokelma and f/u in Nephrology office, but patient has  not been seen in f/u yet. Patient has had no complaints of any cardiac type chest pain, palpitations, dyspnea Vertell Limber /PND, dizziness, claudication or dependent edema.       Hyperlipidemia is not controlled with diet &  OFF meds.  Patient was advised to start  low dose Rosuvastatin 5 mg 3 x/week - but never started Patient denies myalgias or other med SE's. Last Lipids were not at goal :  Lab Results  Component Value Date   CHOL 225 (H) 06/01/2021   HDL 95 06/01/2021   LDLCALC 106 (H) 06/01/2021   TRIG 126 06/01/2021   CHOLHDL 2.4 06/01/2021     Also, the patient has history of PreDiabetes (A1c 5.7%  /2012)  and has had no symptoms of reactive hypoglycemia, diabetic polys, paresthesias or visual blurring.  Last A1c was   Lab Results  Component Value Date   HGBA1C 5.5 01/26/2021                                                          Further, the patient also has history of Vitamin D Deficiency (13 /2009) and supplements vitamin D . Last vitamin D was still very low ( goal is 70-100 )  :   Lab Results  Component Value Date   VD25OH 23 (L) 01/26/2021      Current Outpatient Medications on File Prior to Visit  Medication Sig   allopurinol 100 MG tablet Take 1 tablet  Daily to Prevent Gout   amLODipine  10 MG tablet TAKE ONE TABLET DAILY AT 9 AM FOR BLOOD PRESSURE   aspirin EC 81 MG tablet Take 1 tablet  daily.   VITAMIN D 1000 u tablet Take daily.   cyclobenzaprine  10 MG tablet Take 1 tablet at Bedtime as Needed                    MOUTH   famotidine20 MG tablet TAKE ONE TABLET  DAILY AT 9 AM EVERY MORNING, 30-60 MINUTES PRIOR TO BREAKFAST FOR REFLUX.   fexofenadine 180 MG tablet Take 1 tablet  daily.   furosemide (LASIX) 40 MG tablet Take 1 tablet Daily for BP, Fluid Retention & Ankle Swelling   levETIRAcetam (KEPPRA) 500 MG tablet Take  1 tablet   2 x /day (every 12  hours)  to Prevent Seizures.   Multiple Vitamins-Minerals \ Take 1 tablet daily.   (Patient not taking: Reported on 07/07/2021)   r(Patient not taking: Reported on 07/07/2021)  (Patient not taking: Reported on 07/07/2021)     Allergies  Allergen Reactions   Promethazine-Dm     Palpitations   Ace Inhibitors Nausea And Vomiting   Aspirin Nausea And Vomiting    Can take coated Aspirin     PMHx:   Past Medical History:  Diagnosis Date   Anemia    Arthritis    Chronic diastolic heart failure (HCC)    GERD (gastroesophageal reflux disease)    Heart murmur    Hyperlipidemia    Hypertension may, 1973   Mitral regurgitation    a. Echo 01/2012 mild LVH, EF 55-65%, Gr 2 DD, mod MR, mild LAE, PASP 36;  b. Echo (02/2013):  EF 55-60%, Gr 1 DD, mild to mod MR, mild LAE (no sig change since prior echo) // c. Echo 10/18: EF 55-60, normal wall motion, grade 2 diastolic dysfunction, trivial AI, mild to moderate MR, mild LAE, trivial PI, PASP 38     OSA on CPAP    7 yrs   Seizures ([place]) 01/2017   Stroke ([place]) 07   no weakness jan 29th 2019 no defecits   Subarachnoid hemorrhage due to ruptured aneurysm ([place])    2003 - s/p coiling    vitamin D deficiency      Immunization  History  Administered Date(s) Administered   Influenza Inj Mdck Quad  10/30/2018   Influenza Split 11/11/2013, 10/23/2014   Influenza, High Dose  01/26/2021   Influenza, Seasonal 10/26/2015   Influenza,inj,Quad  02/23/2017   Influenza- 11/25/2012   Moderna SARS-COV2 Booster  03/06/2020   Moderna Sars-Covid-2 Vacc 02/13/2019, 03/13/2019   PNEUMOCOCCAL -20 01/26/2021   Tdap 08/25/2013     Past Surgical History:  Procedure Laterality Date   ABDOMINAL HYSTERECTOMY     complete   ANEURYSM COILING     COLONOSCOPY WITH PROPOFOL N/A 09/05/2018   Procedure: COLONOSCOPY WITH PROPOFOL;  Surgeon: Juanita Craver, MD;  Location: [facility];  Service: Endoscopy;  Laterality: N/A;   ESOPHAGOGASTRODUODENOSCOPY (EGD) WITH PROPOFOL N/A 09/05/2018   Procedure: ESOPHAGOGASTRODUODENOSCOPY (EGD) WITH PROPOFOL;  Surgeon: Juanita Craver, MD;  Location: [facility];  Service: Endoscopy;  Laterality: N/A;   GIVENS CAPSULE STUDY N/A 10/17/2018   Procedure: GIVENS CAPSULE STUDY;  Surgeon: Juanita Craver, MD;  Location: [facility] ENDOSCOPY;  Service: Endoscopy;  Laterality: N/A;   head surgery     aneurysm ruptured- stroke   IR 3D INDEPENDENT WKST  09/04/2016   IR ANGIO INTRA EXTRACRAN SEL COM CAROTID INNOMINATE BILAT MOD SED  09/04/2016   IR ANGIO INTRA EXTRACRAN SEL INTERNAL CAROTID UNI L MOD SED  02/21/2017   IR ANGIO VERTEBRAL SEL VERTEBRAL BILAT MOD SED  09/04/2016   IR ANGIOGRAM EXTREMITY LEFT  09/04/2016   IR ANGIOGRAM FOLLOW UP STUDY  02/21/2017   IR NEURO EACH ADD'L AFTER BASIC UNI LEFT (MS)  02/21/2017   IR RADIOLOGIST EVAL & MGMT  10/11/2016   IR RADIOLOGIST EVAL & MGMT  03/07/2017   IR TRANSCATH/EMBOLIZ  02/21/2017   RADIOLOGY WITH ANESTHESIA N/A 04/23/2013   Procedure: RADIOLOGY WITH ANESTHESIA;  Surgeon: Rob Hickman, MD;  Location: [place];  Service: Radiology;  Laterality: N/A;   RADIOLOGY WITH ANESTHESIA N/A 10/30/2016   Procedure: EMBOLIZATION;  Surgeon: Luanne Bras, MD;  Location: [place];  Service:  Radiology;  Laterality: N/A;  RADIOLOGY WITH ANESTHESIA N/A 02/21/2017   Procedure: EMBOLIZATION;  Surgeon: Luanne Bras, MD;  Location: [place];  Service: Radiology;  Laterality: N/A;   TONSILLECTOMY AND ADENOIDECTOMY     TUBAL LIGATION      FHx:    Reviewed / unchanged  SHx:    Reviewed / unchanged   Systems Review:  Constitutional: Denies fever, chills, wt changes, headaches, insomnia, fatigue, night sweats, change in appetite. Eyes: Denies redness, blurred vision, diplopia, discharge, itchy, watery eyes.  ENT: Denies discharge, congestion, post nasal drip, epistaxis, sore throat, earache, hearing loss, dental pain, tinnitus, vertigo, sinus pain, snoring.  CV: Denies chest pain, palpitations, irregular heartbeat, syncope, dyspnea, diaphoresis, orthopnea, PND, claudication or edema. Respiratory: denies cough, dyspnea, DOE, pleurisy, hoarseness, laryngitis, wheezing.  Gastrointestinal: Denies dysphagia, odynophagia, heartburn, reflux, water brash, abdominal pain or cramps, nausea, vomiting, bloating, diarrhea, constipation, hematemesis, melena, hematochezia  or hemorrhoids. Genitourinary: Denies dysuria, frequency, urgency, nocturia, hesitancy, discharge, hematuria or flank pain. Musculoskeletal: Denies arthralgias, myalgias, stiffness, jt. swelling, pain, limping or strain/sprain.  Skin: Denies pruritus, rash, hives, warts, acne, eczema or change in skin lesion(s). Neuro: No weakness, tremor, incoordination, spasms, paresthesia or pain. Psychiatric: Denies confusion, memory loss or sensory loss. Endo: Denies change in weight, skin or hair change.  Heme/Lymph: No excessive bleeding, bruising or enlarged lymph nodes.  Physical Exam  BP (!) 140/90   Pulse 90   Temp 98 F (36.7 C)   Resp 17   Ht 5\' 10"  (1.778 m)   Wt 233 lb 9.6 oz (106 kg)   SpO2 99%   BMI 33.52 kg/m   Appears  well nourished, well groomed  and in no distress.  Eyes: PERRLA, EOMs, conjunctiva no swelling  or erythema. Sinuses: No frontal/maxillary tenderness ENT/Mouth: EAC's clear, TM's nl w/o erythema, bulging. Nares clear w/o erythema, swelling, exudates. Oropharynx clear without erythema or exudates. Oral hygiene is good. Tongue normal, non obstructing. Hearing intact.  Neck: Supple. Thyroid not palpable. Car 2+/2+ without bruits, nodes or JVD. Chest: Respirations nl with BS clear & equal w/o rales, rhonchi, wheezing or stridor.  Cor: Heart sounds normal w/ regular rate and rhythm without sig. murmurs, gallops, clicks or rubs. Peripheral pulses normal and equal  without edema.  Abdomen: Soft & bowel sounds normal. Non-tender w/o guarding, rebound, hernias, masses or organomegaly.  Lymphatics: Unremarkable.  Musculoskeletal: Full ROM all peripheral extremities, joint stability, 5/5 strength and normal gait.  Skin: Warm, dry without exposed rashes, lesions or ecchymosis apparent.  Neuro: Cranial nerves intact, reflexes equal bilaterally. Sensory-motor testing grossly intact. Tendon reflexes grossly intact.  Pysch: Alert & oriented x 3.  Insight and judgement nl & appropriate. No ideations.  Assessment and Plan:  - Continue medication, monitor blood pressure at home.  - Continue DASH diet.  Reminder to go to the ER if any CP,  SOB, nausea, dizziness, severe HA, changes vision/speech.  - Continue diet/meds, exercise,& lifestyle modifications.  - Continue monitor periodic cholesterol/liver & renal functions  / 1. Essential hypertension  - CBC with Differential/Platelet - COMPLETE METABOLIC PANEL WITH GFR - Magnesium  2. Hyperlipidemia, mixed  - Lipid panel - TSH  3. Abnormal glucose  - Continue diet, exercise  - Lifestyle modifications.  - Monitor appropriate labs  - Hemoglobin A1c - Insulin, random  4. Vitamin D deficiency  - Continue supplementation   - VITAMIN D 25 Hydroxy   5. Seizure disorder (HCC)  - Levetiracetam, Immunoassay  6. CKD stage 4 secondary to  hypertension ([place])  -  COMPLETE METABOLIC PANEL WITH GFR  - Encouraged follow-up with Nephrology  7. Idiopathic gout,  - Uric acid  8. Medication management  - CBC with Differential/Platelet - COMPLETE METABOLIC PANEL WITH GFR - Magnesium - Lipid panel - TSH - Hemoglobin A1c - Insulin, random - VITAMIN D 25 Hydroxy  - Levetiracetam, Immunoassay - Uric acid          Discussed  regular exercise, BP monitoring, weight control to achieve/maintain BMI less than 25 and discussed med and SE's. Recommended labs to assess /monitor clinical status .  I discussed the assessment and treatment plan with the patient. The patient was provided an opportunity to ask questions and all were answered. The patient agreed with the plan and demonstrated an understanding of the instructions.  I provided over 30 minutes of exam, counseling, chart review and  complex critical decision making.        The patient was advised to call back or seek an in-person evaluation if the symptoms worsen or if the condition fails to improve as anticipated.   Kirtland Bouchard, MD

## 2021-09-08 NOTE — Progress Notes (Signed)
<><><><><><><><><><><><><><><><><><><><><><><><><><><><><><><><><> <><><><><><><><><><><><><><><><><><><><><><><><><><><><><><><><><> -   Test results slightly outside the reference range are not unusual. If there is anything important, I will review this with you,  otherwise it is considered normal test values.  If you have further questions,  please do not hesitate to contact me at the office or via My Chart.  <><><><><><><><><><><><><><><><><><><><><><><><><><><><><><><><><> <><><><><><><><><><><><><><><><><><><><><><><><><><><><><><><><><>  -  Hgb Red cell count has dropped down from 10.5 to 8.5 mg%   - needs to come in office today for a "stat"CBC or go to ER to have rechecked <><><><><><><><><><><><><><><><><><><><><><><><><><><><><><><><><>  -  Chol = 217 - too high                 (   Ideal or Goal is less than 180  !  )  &  - LDL Chol = 108 - also too high                  (   Ideal or Goal is less than 70   !   )  <><><><><><><><><><><><><><><><><><><><><><><><><><><><><><><><><>  -  A1c - Normal - No  Diabetes - Great !  <><><><><><><><><><><><><><><><><><><><><><><><><><><><><><><><><>  -  Vitamin D = 31 - Extremely low - Recc take Vit d 5,000 unit capsule every day  <><><><><><><><><><><><><><><><><><><><><><><><><><><><><><><><><>

## 2021-09-10 LAB — LIPID PANEL
Cholesterol: 217 mg/dL — ABNORMAL HIGH (ref <200)
HDL: 89 mg/dL (ref >=50)
LDL Cholesterol (Calc): 108 mg/dL (calc) — ABNORMAL HIGH
Non-HDL Cholesterol (Calc): 128 mg/dL (calc) (ref <130)
Total CHOL/HDL Ratio: 2.4 (calc) (ref <5.0)
Triglycerides: 108 mg/dL (ref <150)

## 2021-09-10 LAB — COMPLETE METABOLIC PANEL WITH GFR
AG Ratio: 1.5 (calc) (ref 1.0–2.5)
ALT: 13 U/L (ref 6–29)
AST: 16 U/L (ref 10–35)
Albumin: 4.5 g/dL (ref 3.6–5.1)
Alkaline phosphatase (APISO): 77 U/L (ref 37–153)
BUN/Creatinine Ratio: 20 (calc) (ref 6–22)
BUN: 34 mg/dL — ABNORMAL HIGH (ref 7–25)
CO2: 25 mmol/L (ref 20–32)
Calcium: 10.1 mg/dL (ref 8.6–10.4)
Chloride: 108 mmol/L (ref 98–110)
Creat: 1.67 mg/dL — ABNORMAL HIGH (ref 0.50–1.05)
Globulin: 3 g/dL (calc) (ref 1.9–3.7)
Glucose, Bld: 89 mg/dL (ref 65–99)
Potassium: 6.1 mmol/L — ABNORMAL HIGH (ref 3.5–5.3)
Sodium: 140 mmol/L (ref 135–146)
Total Bilirubin: 0.2 mg/dL (ref 0.2–1.2)
Total Protein: 7.5 g/dL (ref 6.1–8.1)
eGFR: 34 mL/min/{1.73_m2} — ABNORMAL LOW (ref >=60)

## 2021-09-10 LAB — CBC WITH DIFFERENTIAL/PLATELET
Absolute Monocytes: 519 cells/uL (ref 200–950)
Basophils Absolute: 62 cells/uL (ref 0–200)
Basophils Relative: 0.7 %
Eosinophils Absolute: 431 cells/uL (ref 15–500)
Eosinophils Relative: 4.9 %
HCT: 27.3 % — ABNORMAL LOW (ref 35.0–45.0)
Hemoglobin: 8.5 g/dL — ABNORMAL LOW (ref 11.7–15.5)
Lymphs Abs: 3370 cells/uL (ref 850–3900)
MCH: 26.4 pg — ABNORMAL LOW (ref 27.0–33.0)
MCHC: 31.1 g/dL — ABNORMAL LOW (ref 32.0–36.0)
MCV: 84.8 fL (ref 80.0–100.0)
MPV: 10 fL (ref 7.5–12.5)
Monocytes Relative: 5.9 %
Neutro Abs: 4418 cells/uL (ref 1500–7800)
Neutrophils Relative %: 50.2 %
Platelets: 316 10*3/uL (ref 140–400)
RBC: 3.22 10*6/uL — ABNORMAL LOW (ref 3.80–5.10)
RDW: 16.5 % — ABNORMAL HIGH (ref 11.0–15.0)
Total Lymphocyte: 38.3 %
WBC: 8.8 10*3/uL (ref 3.8–10.8)

## 2021-09-10 LAB — TSH: TSH: 3.73 mIU/L (ref 0.40–4.50)

## 2021-09-10 LAB — MAGNESIUM: Magnesium: 2 mg/dL (ref 1.5–2.5)

## 2021-09-10 LAB — HEMOGLOBIN A1C
Hgb A1c MFr Bld: 5.4 % of total Hgb (ref <5.7)
Mean Plasma Glucose: 108 mg/dL
eAG (mmol/L): 6 mmol/L

## 2021-09-10 LAB — VITAMIN D 25 HYDROXY (VIT D DEFICIENCY, FRACTURES): Vit D, 25-Hydroxy: 31 ng/mL (ref 30–100)

## 2021-09-10 LAB — LEVETIRACETAM, IMMUNOASSAY: LEVETIRACETAM, IMMUNOASSAY: <2 ug/mL — ABNORMAL LOW (ref 6.0–46.0)

## 2021-09-10 LAB — INSULIN, RANDOM: Insulin: 17.3 u[IU]/mL

## 2021-09-10 LAB — URIC ACID: Uric Acid, Serum: 6.1 mg/dL (ref 2.5–7.0)

## 2021-09-11 NOTE — Progress Notes (Signed)
<><><><><><><><><><><><><><><><><><><><><><><><><><><><><><><><><> <><><><><><><><><><><><><><><><><><><><><><><><><><><><><><><><><> -   Test results slightly outside the reference range are not unusual. If there is anything important, I will review this with you,  otherwise it is considered normal test values.  If you have further questions,  please do not hesitate to contact me at the office or via My Chart.  <><><><><><><><><><><><><><><><><><><><><><><><><><><><><><><><><> <><><><><><><><><><><><><><><><><><><><><><><><><><><><><><><><><>  -  Keppra level returned & is undetectable  consistent with not taking the Medicine,   So you are at risk for having a seizure & should NOT Drive  !   - Strongly recommend that you restart the medicine   - Please let us know if we need to send in the Prescription AGAIN !

## 2021-09-14 ENCOUNTER — Encounter: Payer: Self-pay | Admitting: Nurse Practitioner

## 2021-09-14 ENCOUNTER — Ambulatory Visit (INDEPENDENT_AMBULATORY_CARE_PROVIDER_SITE_OTHER): Payer: Medicare PPO | Admitting: Nurse Practitioner

## 2021-09-14 VITALS — BP 120/74 | HR 80 | Temp 97.9°F | Ht 70.0 in | Wt 237.0 lb

## 2021-09-14 DIAGNOSIS — I5032 Chronic diastolic (congestive) heart failure: Secondary | ICD-10-CM

## 2021-09-14 DIAGNOSIS — E875 Hyperkalemia: Secondary | ICD-10-CM | POA: Diagnosis not present

## 2021-09-14 DIAGNOSIS — Z79899 Other long term (current) drug therapy: Secondary | ICD-10-CM | POA: Diagnosis not present

## 2021-09-14 DIAGNOSIS — D638 Anemia in other chronic diseases classified elsewhere: Secondary | ICD-10-CM

## 2021-09-14 DIAGNOSIS — I10 Essential (primary) hypertension: Secondary | ICD-10-CM

## 2021-09-14 DIAGNOSIS — N184 Chronic kidney disease, stage 4 (severe): Secondary | ICD-10-CM | POA: Diagnosis not present

## 2021-09-14 DIAGNOSIS — G40909 Epilepsy, unspecified, not intractable, without status epilepticus: Secondary | ICD-10-CM

## 2021-09-14 DIAGNOSIS — I129 Hypertensive chronic kidney disease with stage 1 through stage 4 chronic kidney disease, or unspecified chronic kidney disease: Secondary | ICD-10-CM | POA: Diagnosis not present

## 2021-09-14 NOTE — Progress Notes (Signed)
Assessment and Plan:  Yolanda Espinoza was seen today for a follow up.  Diagnoses and all order for this visit:  1. Essential hypertension Controlled  Continue Amlodipine - Benicar stopped last ED visit. Discussed DASH (Dietary Approaches to Stop Hypertension) DASH diet is lower in sodium than a typical American diet. Cut back on foods that are high in saturated fat, cholesterol, and trans fats. Eat more whole-grain foods, fish, poultry, and nuts Remain active and exercise as tolerated daily.  Monitor BP at home-Call if greater than 130/80.  Check CMP/CBC  - CBC with Differential/Platelet - COMPLETE METABOLIC PANEL WITH GFR  2. CKD stage 4 secondary to hypertension [facility]) Discussed importance of attending appointment with [facility] Discussed dialysis treatment for futrher treatment of CKD. Allergy to ACE - Continue bASA, statin on board Discussed how what you eat and drink can aide in kidney protection. Stay well hydrated. Avoid high salt foods. Avoid NSAIDS. Keep BP and BG well controlled.   Take medications as prescribed. Remain active and exercise as tolerated daily. Maintain weight.  Continue to monitor. Check CMP/GFR/Microablumin  - COMPLETE METABOLIC PANEL WITH GFR  3. Hyperkalemia  - COMPLETE METABOLIC PANEL WITH GFR  4. Chronic diastolic heart failure (HCC) Continue medications Monitor BP at home-Call if greater than 130/80.    - CBC with Differential/Platelet - COMPLETE METABOLIC PANEL WITH GFR  5. Morbid obesity ([place]) - BMI 30+ with OSA Discussed appropriate BMI Goal of losing 1 lb per month. Diet modification. Physical activity. Encouraged/praised to build confidence.  - CBC with Differential/Platelet - COMPLETE METABOLIC PANEL WITH GFR  6. Anemia of chronic disease- renal Continue to monitor for any increase in fatigue, SOB, blood in stool, urine, or emesis.  - Iron, TIBC and Ferritin Panel  7. Medication management All medications  discussed and reviewed in full. All questions and concerns regarding medications addressed.    - CBC with Differential/Platelet - COMPLETE METABOLIC PANEL WITH GFR - Iron, TIBC and Ferritin Panel - Levetiracetam level  8. Seizure disorder ([place]) Contact office with dosage of Keppra and how you take daily. Continue to monitor  - Levetiracetam level  Orders Placed This Encounter  Procedures   CBC with Differential/Platelet   COMPLETE METABOLIC PANEL WITH GFR   Iron, TIBC and Ferritin Panel   Levetiracetam level   Notify office for further evaluation and treatment, questions or concerns if s/s fail to improve. The risks and benefits of my recommendations, as well as other treatment options were discussed with the patient today. Questions were answered.  Further disposition pending results of labs. Discussed med's effects and SE's.    Over 20 minutes of exam, counseling, chart review, and critical decision making was performed.   Future Appointments  Date Time Provider [place]  01/26/2022 10:00 AM Darrol Jump, NP [facility] None  05/29/2022 10:00 AM Sonam Wandel, Kenney Houseman, NP [facility] None    ------------------------------------------------------------------------------------------------------------------   HPI BP 120/74   Pulse 80   Temp 97.9 F (36.6 C)   Ht 5\' 10"  (1.778 m)   Wt 237 lb (107.5 kg)   SpO2 98%   BMI 34.01 kg/m   Yolanda Espinoza presents for follow up.  During last OV (06/2021) lab work was noted to have elevated K+ levels of 7.1 with gradual uptrend of creatinine. Yolanda Espinoza had a pending evaluation with nephrology, [facility], but has not yet attended.  Patient has not been able to be reached and has had several no shows with Nephrology.  Yolanda Espinoza reported to  ER for further treatment after being notified by office of hyperkalemia.  In the ED Yolanda Espinoza was also noted to have hyponatremia, GFR of 27 (not on dialysis) and Hgb of 8.9. Nephrology, Dr. Hollie Salk was consulted  and patient was provided Lokelma 10 g/day x 3 days.  Benicar was stopped.  Yolanda Espinoza also had an undetectable Keppra level.  Yolanda Espinoza had not been taking medications as prescribed and drinking sodas daily. Yolanda Espinoza is unsure of the Keppra dosage Yolanda Espinoza takes daily.  EKG was NSR.    Today Yolanda Espinoza reports that Yolanda Espinoza has yet followed up with Nephrology but has their number to call.   Yolanda Espinoza tells me that Yolanda Espinoza has no problems committing to her appointments, that her son is able to take her at any time before 3 pm.  Past Medical History:  Diagnosis Date   Anemia    Arthritis    Chronic diastolic heart failure (HCC)    GERD (gastroesophageal reflux disease)    Heart murmur    Hyperlipidemia    Hypertension may, 1973   Mitral regurgitation    a. Echo 01/2012 mild LVH, EF 55-Yolanda%, Gr 2 DD, mod MR, mild LAE, PASP 36;  b. Echo (02/2013):  EF 55-60%, Gr 1 DD, mild to mod MR, mild LAE (no sig change since prior echo) // c. Echo 10/18: EF 55-60, normal wall motion, grade 2 diastolic dysfunction, trivial AI, mild to moderate MR, mild LAE, trivial PI, PASP 38     OSA on CPAP    7 yrs   Seizures ([place]) 01/2017   Stroke ([place]) 07   no weakness jan 29th 2019 no defecits   Subarachnoid hemorrhage due to ruptured aneurysm ([place])    2003 - s/p coiling   Unspecified vitamin D deficiency      Allergies  Allergen Reactions   Promethazine-Dm     Palpitations   Ace Inhibitors Nausea And Vomiting   Aspirin Nausea And Vomiting    Can take coated Aspirin    Current Outpatient Medications on File Prior to Visit  Medication Sig   allopurinol (ZYLOPRIM) 100 MG tablet Take 1 tablet  Daily to Prevent Gout   amLODipine (NORVASC) 10 MG tablet TAKE ONE TABLET BY MOUTH DAILY AT 9 AM FOR BLOOD PRESSURE   aspirin EC 81 MG tablet Take 1 tablet (81 mg total) by mouth daily.   cholecalciferol (VITAMIN D3) 25 MCG (1000 UT) tablet Take 1,000 Units by mouth daily.   cyclobenzaprine (FLEXERIL) 10 MG tablet Take 1 tablet at Bedtime as Needed for Muscle  Spasms                                                /                   TAKE                              BY                           MOUTH   famotidine (PEPCID) 20 MG tablet TAKE ONE TABLET BY MOUTH DAILY AT 9 AM EVERY MORNING, 30-60 MINUTES PRIOR TO BREAKFAST FOR REFLUX.   fexofenadine (ALLEGRA ALLERGY) 180 MG tablet Take 1 tablet (180 mg total)  by mouth daily.   furosemide (LASIX) 40 MG tablet Take 1 tablet Daily for BP, Fluid Retention & Ankle Swelling   levETIRAcetam (KEPPRA) 500 MG tablet Take  1 tablet   2 x /day (every 12 hours)  to Prevent Seizures.   Multiple Vitamins-Minerals (MULTIVITAMIN WITH MINERALS) tablet Take 1 tablet by mouth daily.   pantoprazole (PROTONIX) 40 MG tablet Take one tablet at bedtime   rosuvastatin (CRESTOR) 5 MG tablet Take 1 tab three days a week (MWF) for cholesterol.   ticagrelor (BRILINTA) 90 MG TABS tablet Take 1 tablet  2 x /day (every 12 hours )  to prevent Blood Clots   No current facility-administered medications on file prior to visit.    ROS: all negative except what is noted in the HPI.   Physical Exam:  BP 120/74   Pulse 80   Temp 97.9 F (36.6 C)   Ht 5\' 10"  (1.778 m)   Wt 237 lb (107.5 kg)   SpO2 98%   BMI 34.01 kg/m   General Appearance: NAD.  Awake, conversant and cooperative. Eyes: PERRLA, EOMs intact.  Sclera white.  Conjunctiva without erythema. Sinuses: No frontal/maxillary tenderness.  No nasal discharge. Nares patent.  ENT/Mouth: Ext aud canals clear.  Bilateral TMs w/DOL and without erythema or bulging. Hearing intact.  Posterior pharynx without swelling or exudate.  Tonsils without swelling or erythema.  Neck: Supple.  No masses, nodules or thyromegaly. Respiratory: Effort is regular with non-labored breathing. Breath sounds are equal bilaterally without rales, rhonchi, wheezing or stridor.  Cardio: RRR with no MRGs. Brisk peripheral pulses without edema.  Abdomen: Active BS in all four quadrants.  Soft and non-tender  without guarding, rebound tenderness, hernias or masses. Lymphatics: Non tender without lymphadenopathy.  Musculoskeletal: Full ROM, 5/5 strength, normal ambulation.  No clubbing or cyanosis. Skin: Appropriate color for ethnicity. Warm without rashes, lesions, ecchymosis, ulcers.  Neuro: CN II-XII grossly normal. Normal muscle tone without cerebellar symptoms and intact sensation.   Psych: AO X 3,  appropriate mood and affect, insight and judgment.     Darrol Jump, NP 12:15 PM [facility]

## 2021-09-15 ENCOUNTER — Encounter (HOSPITAL_COMMUNITY): Payer: Self-pay | Admitting: Internal Medicine

## 2021-09-15 ENCOUNTER — Inpatient Hospital Stay (HOSPITAL_COMMUNITY)
Admission: EM | Admit: 2021-09-15 | Discharge: 2021-09-18 | DRG: 641 | Disposition: A | Discharge status: Home / Self Care | Payer: Medicare PPO | Attending: Internal Medicine | Admitting: Internal Medicine

## 2021-09-15 ENCOUNTER — Other Ambulatory Visit: Payer: Self-pay

## 2021-09-15 DIAGNOSIS — G4733 Obstructive sleep apnea (adult) (pediatric): Secondary | ICD-10-CM | POA: Diagnosis present

## 2021-09-15 DIAGNOSIS — Z90710 Acquired absence of both cervix and uterus: Secondary | ICD-10-CM

## 2021-09-15 DIAGNOSIS — M1990 Unspecified osteoarthritis, unspecified site: Secondary | ICD-10-CM | POA: Diagnosis present

## 2021-09-15 DIAGNOSIS — I5032 Chronic diastolic (congestive) heart failure: Secondary | ICD-10-CM | POA: Diagnosis present

## 2021-09-15 DIAGNOSIS — Z87891 Personal history of nicotine dependence: Secondary | ICD-10-CM | POA: Diagnosis not present

## 2021-09-15 DIAGNOSIS — D638 Anemia in other chronic diseases classified elsewhere: Secondary | ICD-10-CM | POA: Diagnosis not present

## 2021-09-15 DIAGNOSIS — Z888 Allergy status to other drugs, medicaments and biological substances status: Secondary | ICD-10-CM

## 2021-09-15 DIAGNOSIS — E875 Hyperkalemia: Principal | ICD-10-CM | POA: Diagnosis present

## 2021-09-15 DIAGNOSIS — I5030 Unspecified diastolic (congestive) heart failure: Secondary | ICD-10-CM | POA: Diagnosis present

## 2021-09-15 DIAGNOSIS — I10 Essential (primary) hypertension: Secondary | ICD-10-CM | POA: Diagnosis not present

## 2021-09-15 DIAGNOSIS — Z841 Family history of disorders of kidney and ureter: Secondary | ICD-10-CM

## 2021-09-15 DIAGNOSIS — E785 Hyperlipidemia, unspecified: Secondary | ICD-10-CM | POA: Diagnosis present

## 2021-09-15 DIAGNOSIS — E559 Vitamin D deficiency, unspecified: Secondary | ICD-10-CM | POA: Diagnosis present

## 2021-09-15 DIAGNOSIS — Z7902 Long term (current) use of antithrombotics/antiplatelets: Secondary | ICD-10-CM

## 2021-09-15 DIAGNOSIS — K219 Gastro-esophageal reflux disease without esophagitis: Secondary | ICD-10-CM | POA: Diagnosis present

## 2021-09-15 DIAGNOSIS — Z79899 Other long term (current) drug therapy: Secondary | ICD-10-CM

## 2021-09-15 DIAGNOSIS — Z8249 Family history of ischemic heart disease and other diseases of the circulatory system: Secondary | ICD-10-CM | POA: Diagnosis not present

## 2021-09-15 DIAGNOSIS — Z8673 Personal history of transient ischemic attack (TIA), and cerebral infarction without residual deficits: Secondary | ICD-10-CM

## 2021-09-15 DIAGNOSIS — Z9851 Tubal ligation status: Secondary | ICD-10-CM

## 2021-09-15 DIAGNOSIS — I130 Hypertensive heart and chronic kidney disease with heart failure and stage 1 through stage 4 chronic kidney disease, or unspecified chronic kidney disease: Secondary | ICD-10-CM | POA: Diagnosis present

## 2021-09-15 DIAGNOSIS — N184 Chronic kidney disease, stage 4 (severe): Secondary | ICD-10-CM | POA: Diagnosis not present

## 2021-09-15 DIAGNOSIS — D631 Anemia in chronic kidney disease: Secondary | ICD-10-CM | POA: Diagnosis present

## 2021-09-15 DIAGNOSIS — N1832 Chronic kidney disease, stage 3b: Secondary | ICD-10-CM | POA: Diagnosis present

## 2021-09-15 DIAGNOSIS — Z9989 Dependence on other enabling machines and devices: Secondary | ICD-10-CM

## 2021-09-15 DIAGNOSIS — G40909 Epilepsy, unspecified, not intractable, without status epilepticus: Secondary | ICD-10-CM | POA: Diagnosis present

## 2021-09-15 DIAGNOSIS — Z6830 Body mass index (BMI) 30.0-30.9, adult: Secondary | ICD-10-CM

## 2021-09-15 DIAGNOSIS — D649 Anemia, unspecified: Secondary | ICD-10-CM | POA: Diagnosis not present

## 2021-09-15 DIAGNOSIS — Z7982 Long term (current) use of aspirin: Secondary | ICD-10-CM

## 2021-09-15 DIAGNOSIS — M109 Gout, unspecified: Secondary | ICD-10-CM | POA: Diagnosis present

## 2021-09-15 LAB — CBC WITH DIFFERENTIAL/PLATELET
Abs Immature Granulocytes: 0.04 10*3/uL (ref 0.00–0.07)
Basophils Absolute: 0.1 10*3/uL (ref 0.0–0.1)
Basophils Relative: 1 %
Eosinophils Absolute: 0.4 10*3/uL (ref 0.0–0.5)
Eosinophils Relative: 5 %
HCT: 26.4 % — ABNORMAL LOW (ref 36.0–46.0)
Hemoglobin: 8 g/dL — ABNORMAL LOW (ref 12.0–15.0)
Immature Granulocytes: 1 %
Lymphocytes Relative: 35 %
Lymphs Abs: 2.9 10*3/uL (ref 0.7–4.0)
MCH: 26.8 pg (ref 26.0–34.0)
MCHC: 30.3 g/dL (ref 30.0–36.0)
MCV: 88.6 fL (ref 80.0–100.0)
Monocytes Absolute: 0.5 10*3/uL (ref 0.1–1.0)
Monocytes Relative: 6 %
Neutro Abs: 4.5 10*3/uL (ref 1.7–7.7)
Neutrophils Relative %: 52 %
Platelets: 343 10*3/uL (ref 150–400)
RBC: 2.98 MIL/uL — ABNORMAL LOW (ref 3.87–5.11)
RDW: 17.4 % — ABNORMAL HIGH (ref 11.5–15.5)
WBC: 8.4 10*3/uL (ref 4.0–10.5)
nRBC: 0 % (ref 0.0–0.2)

## 2021-09-15 LAB — COMPREHENSIVE METABOLIC PANEL
ALT: 12 U/L (ref 0–44)
AST: 18 U/L (ref 15–41)
Albumin: 4 g/dL (ref 3.5–5.0)
Alkaline Phosphatase: 67 U/L (ref 38–126)
Anion gap: 6 (ref 5–15)
BUN: 33 mg/dL — ABNORMAL HIGH (ref 8–23)
CO2: 19 mmol/L — ABNORMAL LOW (ref 22–32)
Calcium: 9.6 mg/dL (ref 8.9–10.3)
Chloride: 108 mmol/L (ref 98–111)
Creatinine, Ser: 2.05 mg/dL — ABNORMAL HIGH (ref 0.44–1.00)
GFR, Estimated: 26 mL/min — ABNORMAL LOW (ref >60)
Glucose, Bld: 86 mg/dL (ref 70–99)
Potassium: 7 mmol/L (ref 3.5–5.1)
Sodium: 133 mmol/L — ABNORMAL LOW (ref 135–145)
Total Bilirubin: 0.3 mg/dL (ref 0.3–1.2)
Total Protein: 7.4 g/dL (ref 6.5–8.1)

## 2021-09-15 LAB — BASIC METABOLIC PANEL
Anion gap: 6 (ref 5–15)
BUN: 30 mg/dL — ABNORMAL HIGH (ref 8–23)
CO2: 16 mmol/L — ABNORMAL LOW (ref 22–32)
Calcium: 7.9 mg/dL — ABNORMAL LOW (ref 8.9–10.3)
Chloride: 114 mmol/L — ABNORMAL HIGH (ref 98–111)
Creatinine, Ser: 1.59 mg/dL — ABNORMAL HIGH (ref 0.44–1.00)
GFR, Estimated: 36 mL/min — ABNORMAL LOW (ref >60)
Glucose, Bld: 102 mg/dL — ABNORMAL HIGH (ref 70–99)
Potassium: 5.4 mmol/L — ABNORMAL HIGH (ref 3.5–5.1)
Sodium: 136 mmol/L (ref 135–145)

## 2021-09-15 LAB — NA AND K (SODIUM & POTASSIUM), RAND UR
Potassium Urine: 10 mmol/L
Sodium, Ur: 102 mmol/L

## 2021-09-15 LAB — I-STAT CHEM 8, ED
BUN: 33 mg/dL — ABNORMAL HIGH (ref 8–23)
BUN: 32 mg/dL — ABNORMAL HIGH (ref 8–23)
Calcium, Ion: 1.3 mmol/L (ref 1.15–1.40)
Calcium, Ion: 1.26 mmol/L (ref 1.15–1.40)
Chloride: 108 mmol/L (ref 98–111)
Chloride: 106 mmol/L (ref 98–111)
Creatinine, Ser: 1.9 mg/dL — ABNORMAL HIGH (ref 0.44–1.00)
Creatinine, Ser: 2 mg/dL — ABNORMAL HIGH (ref 0.44–1.00)
Glucose, Bld: 77 mg/dL (ref 70–99)
Glucose, Bld: 116 mg/dL — ABNORMAL HIGH (ref 70–99)
HCT: 26 % — ABNORMAL LOW (ref 36.0–46.0)
HCT: 26 % — ABNORMAL LOW (ref 36.0–46.0)
Hemoglobin: 8.8 g/dL — ABNORMAL LOW (ref 12.0–15.0)
Hemoglobin: 8.8 g/dL — ABNORMAL LOW (ref 12.0–15.0)
Potassium: 4.9 mmol/L (ref 3.5–5.1)
Potassium: 5.4 mmol/L — ABNORMAL HIGH (ref 3.5–5.1)
Sodium: 137 mmol/L (ref 135–145)
Sodium: 137 mmol/L (ref 135–145)
TCO2: 22 mmol/L (ref 22–32)
TCO2: 22 mmol/L (ref 22–32)

## 2021-09-15 LAB — CBG MONITORING, ED
Glucose-Capillary: 87 mg/dL (ref 70–99)
Glucose-Capillary: 80 mg/dL (ref 70–99)

## 2021-09-15 LAB — IRON AND TIBC
Iron: 59 ug/dL (ref 28–170)
Saturation Ratios: 14 % (ref 10.4–31.8)
TIBC: 433 ug/dL (ref 250–450)
UIBC: 374 ug/dL

## 2021-09-15 LAB — CK: Total CK: 56 U/L (ref 38–234)

## 2021-09-15 LAB — CREATININE, URINE, RANDOM: Creatinine, Urine: 17 mg/dL

## 2021-09-15 LAB — FERRITIN: Ferritin: 10 ng/mL — ABNORMAL LOW (ref 11–307)

## 2021-09-15 LAB — LACTATE DEHYDROGENASE: LDH: 147 U/L (ref 98–192)

## 2021-09-15 MED ORDER — PATIROMER SORBITEX CALCIUM 8.4 G PO PACK
8.4000 g | PACK | ORAL | Status: AC
Start: 2021-09-15 — End: 2021-09-15
  Administered 2021-09-15 (×2): 8.4 g via ORAL
  Filled 2021-09-15 (×3): qty 1

## 2021-09-15 MED ORDER — DEXTROSE 50 % IV SOLN
1.0000 | Freq: Once | INTRAVENOUS | Status: AC
  Administered 2021-09-15: 50 mL via INTRAVENOUS
  Filled 2021-09-15: qty 50

## 2021-09-15 MED ORDER — TICAGRELOR 90 MG PO TABS
90.0000 mg | ORAL_TABLET | Freq: Two times a day (BID) | ORAL | Status: DC
  Administered 2021-09-15 – 2021-09-18 (×6): 90 mg via ORAL
  Filled 2021-09-15 (×6): qty 1

## 2021-09-15 MED ORDER — ACETAMINOPHEN 325 MG PO TABS: 650.0000 mg | ORAL_TABLET | Freq: Four times a day (QID) | ORAL | Status: DC | PRN

## 2021-09-15 MED ORDER — FUROSEMIDE 10 MG/ML IJ SOLN
40.0000 mg | Freq: Once | INTRAMUSCULAR | Status: AC
Start: 2021-09-15 — End: 2021-09-15
  Administered 2021-09-15: 40 mg via INTRAVENOUS
  Filled 2021-09-15: qty 4

## 2021-09-15 MED ORDER — FAMOTIDINE 20 MG PO TABS
20.0000 mg | ORAL_TABLET | Freq: Every day | ORAL | Status: DC
Start: 2021-09-15 — End: 2021-09-18
  Administered 2021-09-15 – 2021-09-17 (×3): 20 mg via ORAL
  Filled 2021-09-15 (×3): qty 1

## 2021-09-15 MED ORDER — VITAMIN D 25 MCG (1000 UNIT) PO TABS
1000.0000 [IU] | ORAL_TABLET | Freq: Every day | ORAL | Status: DC
  Administered 2021-09-15 – 2021-09-18 (×4): 1000 [IU] via ORAL
  Filled 2021-09-15 (×4): qty 1

## 2021-09-15 MED ORDER — ROSUVASTATIN CALCIUM 5 MG PO TABS
5.0000 mg | ORAL_TABLET | ORAL | Status: DC
  Administered 2021-09-16: 5 mg via ORAL
  Filled 2021-09-15: qty 1

## 2021-09-15 MED ORDER — PANTOPRAZOLE SODIUM 40 MG PO TBEC
40.0000 mg | DELAYED_RELEASE_TABLET | Freq: Every day | ORAL | Status: DC
Start: 2021-09-15 — End: 2021-09-18
  Administered 2021-09-15 – 2021-09-18 (×3): 40 mg via ORAL
  Filled 2021-09-15 (×4): qty 1

## 2021-09-15 MED ORDER — SODIUM ZIRCONIUM CYCLOSILICATE 10 G PO PACK
10.0000 g | PACK | Freq: Once | ORAL | Status: AC
  Administered 2021-09-15: 10 g via ORAL
  Filled 2021-09-15: qty 1

## 2021-09-15 MED ORDER — ALLOPURINOL 100 MG PO TABS
100.0000 mg | ORAL_TABLET | Freq: Every day | ORAL | Status: DC
  Administered 2021-09-16 – 2021-09-18 (×3): 100 mg via ORAL
  Filled 2021-09-15 (×3): qty 1

## 2021-09-15 MED ORDER — LEVETIRACETAM 500 MG PO TABS
1000.0000 mg | ORAL_TABLET | Freq: Two times a day (BID) | ORAL | Status: DC
  Administered 2021-09-15 – 2021-09-18 (×6): 1000 mg via ORAL
  Filled 2021-09-15 (×7): qty 2

## 2021-09-15 MED ORDER — AMLODIPINE BESYLATE 10 MG PO TABS
10.0000 mg | ORAL_TABLET | Freq: Every day | ORAL | Status: DC
  Administered 2021-09-16: 10 mg via ORAL
  Filled 2021-09-15: qty 1

## 2021-09-15 MED ORDER — ACETAMINOPHEN 650 MG RE SUPP: 650.0000 mg | Freq: Four times a day (QID) | RECTAL | Status: DC | PRN

## 2021-09-15 MED ORDER — HYDRALAZINE HCL 20 MG/ML IJ SOLN: 5.0000 mg | Freq: Four times a day (QID) | INTRAMUSCULAR | Status: DC | PRN

## 2021-09-15 MED ORDER — SODIUM CHLORIDE 0.9 % IV BOLUS
500.0000 mL | Freq: Once | INTRAVENOUS | Status: AC
  Administered 2021-09-15: 500 mL via INTRAVENOUS

## 2021-09-15 MED ORDER — INSULIN ASPART 100 UNIT/ML IV SOLN
5.0000 [IU] | Freq: Once | INTRAVENOUS | Status: AC
Start: 2021-09-15 — End: 2021-09-15
  Administered 2021-09-15: 5 [IU] via INTRAVENOUS

## 2021-09-15 MED ORDER — ASPIRIN 81 MG PO TBEC
81.0000 mg | DELAYED_RELEASE_TABLET | Freq: Every day | ORAL | Status: DC
  Administered 2021-09-16 – 2021-09-18 (×3): 81 mg via ORAL
  Filled 2021-09-15 (×3): qty 1

## 2021-09-15 NOTE — ED Notes (Signed)
Clarified with Dr.Ray if Veltassa to be given as pt repeat K is 4.9 on Chem8. Per MD, give Veltassa as Dr.Patel ordered med. Will continue to monitor.

## 2021-09-15 NOTE — H&P (Signed)
TRH H&P   Patient Demographics:    Yolanda Espinoza, is a 66 y.o. female  MRN: [id number]   DOB - [birth year]  Admit Date - 09/15/2021  Outpatient Primary MD for the patient is Unk Pinto, MD  Referring MD/NP/PA: DR Pryor Curia  Patient coming from: home  Chief Complaint  Patient presents with   Abnormal Labs      HPI:    Yolanda Espinoza  is a 66 y.o. female, with significant past medical history for hypertension, hyperlipidemia, chronic diastolic CHF, obstructive sleep apnea on CPAP, history of seizure disorders, history of brain aneurysm, subarachnoid hemorrhage, CVA, chronic kidney disease stage IIIb/IV, baseline creatinine 1.7-2, patient was sent by her PCP for hyperkalemia, blood work showing potassium of 6.1 last week, repeat today at PCP office showing a potassium of 7.1, so patient was directed to come to ED for further management, patient herself denies any complaints, reports she is at her usual state of health, compliant with her medications, she is not following any dietary restriction, no nausea, no vomiting, no diarrhea -In ED repeat labs confirmed potassium of 7, for which patient has received Lasix, D50 with IV insulin and calcium gluconate, repeat labs on i-STAT showing potassium of 4.9, patient was seen and evaluated by renal she had ordered Veltassa, and [facility] hospitalist consulted to admit.    Review of systems:     A full 10 point Review of Systems was done, except as stated above, all other Review of Systems were negative.   With Past History of the following :    Past Medical History:  Diagnosis Date   Anemia    Arthritis    Chronic diastolic heart failure (HCC)    GERD (gastroesophageal reflux disease)    Heart murmur    Hyperlipidemia    Hypertension may, 1973   Mitral regurgitation    a. Echo 01/2012 mild LVH, EF 55-65%, Gr 2 DD, mod MR, mild LAE,  PASP 36;  b. Echo (02/2013):  EF 55-60%, Gr 1 DD, mild to mod MR, mild LAE (no sig change since prior echo) // c. Echo 10/18: EF 55-60, normal wall motion, grade 2 diastolic dysfunction, trivial AI, mild to moderate MR, mild LAE, trivial PI, PASP 38     OSA on CPAP    7 yrs   Seizures ([place]) 01/2017   Stroke ([place]) 07   no weakness jan 29th 2019 no defecits   Subarachnoid hemorrhage due to ruptured aneurysm ([place])    2003 - s/p coiling   Unspecified vitamin D deficiency       Past Surgical History:  Procedure Laterality Date   ABDOMINAL HYSTERECTOMY     complete   ANEURYSM COILING     COLONOSCOPY WITH PROPOFOL N/A 09/05/2018   Procedure: COLONOSCOPY WITH PROPOFOL;  Surgeon: Juanita Craver, MD;  Location: [facility] ENDOSCOPY;  Service: Endoscopy;  Laterality: N/A;   ESOPHAGOGASTRODUODENOSCOPY (  EGD) WITH PROPOFOL N/A 09/05/2018   Procedure: ESOPHAGOGASTRODUODENOSCOPY (EGD) WITH PROPOFOL;  Surgeon: Juanita Craver, MD;  Location: [facility] ENDOSCOPY;  Service: Endoscopy;  Laterality: N/A;   GIVENS CAPSULE STUDY N/A 10/17/2018   Procedure: GIVENS CAPSULE STUDY;  Surgeon: Juanita Craver, MD;  Location: [facility] ENDOSCOPY;  Service: Endoscopy;  Laterality: N/A;   head surgery     aneurysm ruptured- stroke   IR 3D INDEPENDENT WKST  09/04/2016   IR ANGIO INTRA EXTRACRAN SEL COM CAROTID INNOMINATE BILAT MOD SED  09/04/2016   IR ANGIO INTRA EXTRACRAN SEL INTERNAL CAROTID UNI L MOD SED  02/21/2017   IR ANGIO VERTEBRAL SEL VERTEBRAL BILAT MOD SED  09/04/2016   IR ANGIOGRAM EXTREMITY LEFT  09/04/2016   IR ANGIOGRAM FOLLOW UP STUDY  02/21/2017   IR NEURO EACH ADD'L AFTER BASIC UNI LEFT (MS)  02/21/2017   IR RADIOLOGIST EVAL & MGMT  10/11/2016   IR RADIOLOGIST EVAL & MGMT  03/07/2017   IR TRANSCATH/EMBOLIZ  02/21/2017   RADIOLOGY WITH ANESTHESIA N/A 04/23/2013   Procedure: RADIOLOGY WITH ANESTHESIA;  Surgeon: Rob Hickman, MD;  Location: [place];  Service: Radiology;  Laterality: N/A;   RADIOLOGY WITH ANESTHESIA N/A 10/30/2016    Procedure: EMBOLIZATION;  Surgeon: Luanne Bras, MD;  Location: [place];  Service: Radiology;  Laterality: N/A;   RADIOLOGY WITH ANESTHESIA N/A 02/21/2017   Procedure: EMBOLIZATION;  Surgeon: Luanne Bras, MD;  Location: [place];  Service: Radiology;  Laterality: N/A;   TONSILLECTOMY AND ADENOIDECTOMY     TUBAL LIGATION        Social History:     Social History   Tobacco Use   Smoking status: Former    Packs/day: 0.25    Years: 3.00    Total pack years: 0.75    Types: Cigarettes    Quit date: 01/24/1999    Years since quitting: 22.6   Smokeless tobacco: Never  Substance Use Topics   Alcohol use: No       Family History :     Family History  Problem Relation Age of Onset   Hypertension Father    Kidney disease Father    Hypertension Sister    Cancer Sister 41       Colon cancer   Hypertension Sister    Hypertension Sister    Sleep apnea Neg Hx      Home Medications:   Prior to Admission medications   Medication Sig Start Date End Date Taking? Authorizing Provider  allopurinol (ZYLOPRIM) 100 MG tablet Take 1 tablet  Daily to Prevent Gout 04/11/21  Yes Unk Pinto, MD  amLODipine (NORVASC) 10 MG tablet TAKE ONE TABLET BY MOUTH DAILY AT 9 AM FOR BLOOD PRESSURE 04/13/21  Yes Liane Comber, NP  aspirin EC 81 MG tablet Take 1 tablet (81 mg total) by mouth daily. 03/11/15  Yes Nahser, Wonda Cheng, MD  cholecalciferol (VITAMIN D3) 25 MCG (1000 UT) tablet Take 1,000 Units by mouth daily.   Yes [provider]  cyclobenzaprine (FLEXERIL) 10 MG tablet Take 1 tablet at Bedtime as Needed for Muscle Spasms                                                /                   TAKE  BY                           MOUTH 05/28/21  Yes Unk Pinto, MD  diclofenac (VOLTAREN) 75 MG EC tablet Take 75 mg by mouth 2 (two) times daily. 08/19/21  Yes [provider]  famotidine (PEPCID) 20 MG tablet TAKE ONE TABLET BY MOUTH DAILY AT 9 AM EVERY  MORNING, 30-60 MINUTES PRIOR TO BREAKFAST FOR REFLUX. Patient taking differently: Take 20 mg by mouth at bedtime. TAKE ONE TABLET BY MOUTH DAILY AT 9 AM EVERY MORNING, 30-60 MINUTES PRIOR TO BREAKFAST FOR REFLUX. 08/26/21  Yes Alycia Rossetti, NP  fexofenadine [facility] ALLERGY) 180 MG tablet Take 1 tablet (180 mg total) by mouth daily. 12/26/19  Yes Liane Comber, NP  furosemide (LASIX) 40 MG tablet Take 1 tablet Daily for BP, Fluid Retention & Ankle Swelling 11/14/18  Yes Unk Pinto, MD  levETIRAcetam (KEPPRA) 1000 MG tablet Take 1,000 mg by mouth 2 (two) times daily. 08/19/21  Yes [provider]  Multiple Vitamins-Minerals (MULTIVITAMIN WITH MINERALS) tablet Take 1 tablet by mouth daily.   Yes [provider]  olmesartan (BENICAR) 40 MG tablet Take 40 mg by mouth daily. 08/17/21  Yes [provider]  pantoprazole (PROTONIX) 40 MG tablet Take one tablet at bedtime 04/01/21  Yes Corbett, Caryl Pina, NP  rosuvastatin (CRESTOR) 5 MG tablet Take 1 tab three days a week (MWF) for cholesterol. 06/02/21  Yes Liane Comber, NP  ticagrelor (BRILINTA) 90 MG TABS tablet Take 1 tablet  2 x /day (every 12 hours )  to prevent Blood Clots 10/12/20  Yes Liane Comber, NP  levETIRAcetam (KEPPRA) 500 MG tablet Take  1 tablet   2 x /day (every 12 hours)  to Prevent Seizures. Patient not taking: Reported on 09/15/2021 06/25/21   Unk Pinto, MD     Allergies:     Allergies  Allergen Reactions   Promethazine-Dm     Palpitations   Ace Inhibitors Nausea And Vomiting   Aspirin Nausea And Vomiting    Can take coated Aspirin     Physical Exam:   Vitals  Blood pressure (!) 152/88, pulse 82, temperature 98 F (36.7 C), temperature source Oral, resp. rate 17, SpO2 100 %.   1. General well developed female, laying in bed, no apparent distress  2. Normal affect and insight, Not Suicidal or Homicidal, Awake Alert, Oriented X 3.  3. No F.N deficits, ALL C.Nerves Intact, Strength  5/5 all 4 extremities, Sensation intact all 4 extremities, Plantars down going.  4. Ears and Eyes appear Normal, Conjunctivae clear, PERRLA. Moist Oral Mucosa.  5. Supple Neck, No JVD, No cervical lymphadenopathy appriciated, No Carotid Bruits.  6. Symmetrical Chest wall movement, Good air movement bilaterally, CTAB.  7. RRR, No Gallops, Rubs or Murmurs, No Parasternal Heave.  8. Positive Bowel Sounds, Abdomen Soft, No tenderness, No organomegaly appriciated,No rebound -guarding or rigidity.  9.  No Cyanosis, Normal Skin Turgor, No Skin Rash or Bruise.  10. Good muscle tone,  joints appear normal , no effusions, Normal ROM.  11. No Palpable Lymph Nodes in Neck or Axillae    Data Review:    CBC Recent Labs  Lab 09/14/21 1214 09/15/21 1054 09/15/21 1507  WBC 7.8 8.4  --   HGB 8.5* 8.0* 8.8*  HCT 26.8* 26.4* 26.0*  PLT 351 343  --   MCV 84.0 88.6  --   MCH 26.6* 26.8  --  MCHC 31.7* 30.3  --   RDW 16.7* 17.4*  --   LYMPHSABS 3,292 2.9  --   MONOABS  --  0.5  --   EOSABS 491 0.4  --   BASOSABS 62 0.1  --    ------------------------------------------------------------------------------------------------------------------  Chemistries  Recent Labs  Lab 09/14/21 1214 09/15/21 1054 09/15/21 1507  NA 138 133* 137  K 7.1* 7.0* 4.9  CL 109 108 108  CO2 24 19*  --   GLUCOSE 88 86 77  BUN 36* 33* 33*  CREATININE 1.95* 2.05* 1.90*  CALCIUM 10.3 9.6  --   AST 14 18  --   ALT 12 12  --   ALKPHOS  --  67  --   BILITOT 0.2 0.3  --    ------------------------------------------------------------------------------------------------------------------ estimated creatinine clearance is 39.2 mL/min (A) (by C-G formula based on SCr of 1.9 mg/dL (H)). ------------------------------------------------------------------------------------------------------------------ No results for input(s): "TSH", "T4TOTAL", "T3FREE", "THYROIDAB" in the last 72 hours.  Invalid input(s):  "FREET3"  Coagulation profile No results for input(s): "INR", "PROTIME" in the last 168 hours. ------------------------------------------------------------------------------------------------------------------- No results for input(s): "DDIMER" in the last 72 hours. -------------------------------------------------------------------------------------------------------------------  Cardiac Enzymes No results for input(s): "CKMB", "TROPONINI", "MYOGLOBIN" in the last 168 hours.  Invalid input(s): "CK" ------------------------------------------------------------------------------------------------------------------    Component Value Date/Time   BNP 168.7 (H) 03/14/2016 1602     ---------------------------------------------------------------------------------------------------------------  Urinalysis    Component Value Date/Time   COLORURINE YELLOW 01/26/2021 1124   APPEARANCEUR CLEAR 01/26/2021 1124   LABSPEC 1.009 01/26/2021 1124   PHURINE 6.0 01/26/2021 1124   GLUCOSEU NEGATIVE 01/26/2021 1124   [place] 01/26/2021 1124   [place] 10/26/2015 0925   KETONESUR NEGATIVE 01/26/2021 1124   PROTEINUR 1+ (A) 01/26/2021 1124   UROBILINOGEN 0.2 10/01/2013 0831   NITRITE NEGATIVE 01/26/2021 1124   LEUKOCYTESUR 2+ (A) 01/26/2021 1124    ----------------------------------------------------------------------------------------------------------------   Imaging Results:    No results found.  My personal review of EKG: Rhythm NSR, Rate  91 /min, QTc 398   Assessment & Plan:    Principal Problem:   Hyperkalemia Active Problems:   Essential hypertension   Chronic diastolic heart failure (HCC)   OSA on CPAP   Vitamin D deficiency   Hyperlipidemia   Morbid obesity (HCC) - BMI 30+ with OSA   Gout   Gastroesophageal reflux disease   Anemia of chronic disease- renal   Hyperkalemia -This is most likely due to her chronic kidney disease, as well he does appear  to be on Benicar as an outpatient -Renal input greatly appreciated, it i-STAT level showing normal potassium at 4.9 (but this is after she received calcium, D50/IV insulin and Lasix), possible pseudohyponatremia, renal has ordered repeat labs at 7 PM including BMP lab, and i-STAT Chem-8 together. -Monitor BMP closely -CK within normal limits -No EKG changes or telemetry arrhythmias, continue to monitor on telemetry -Continue with Veltassa x2 doses -Keep on renal diet  CKD stage IIIb/IV -Appears to be due to underlying hypertension, versus ischemic nephropathy -Continue with gentle hydration to allow for diuresis  Hypertension  - blood pressure acceptable, continue with home medication amlodipine, Benicar remains on hold  Anemia of chronic kidney disease, -At baseline  History of cerebral aneurysmS/p pipeline flow diverter across x2 Lt ICA  paraophthalmic aneurysms and CVA -He is following with IR Dr. Estanislado Pandy as an outpatient, she remains on aspirin and Brilinta which will be continued  History of gout -continue with allopurinol  GERD -Continue with PPI/famotidine  Hyperlipidemia -  Continue with Crestor  History of seizures -Continue with Keppra  OSA - continue with CPAP  Chronic diastolic CHF -Euvolemic, monitor closely as on IV fluids(for possible need for further IV Lasix for hyperkalemia).   DVT Prophylaxis SCDs   AM Labs Ordered, also please review Full Orders  Family Communication: Admission, patients condition and plan of care including tests being ordered have been discussed with the patient who indicate understanding and agree with the plan and Code Status.  Code Status Full  Likely DC to  home  Condition GUARDED    Consults called: renal    Admission status: observation    Time spent in minutes : 70 minutes   Phillips Climes M.D on 09/15/2021 at [place] PM   [facility] - Office  [phone]

## 2021-09-15 NOTE — Progress Notes (Signed)
Patient ID: Yolanda Espinoza, female   DOB: [birth year], 66 y.o.   MRN: [id number]  The patient's i-STAT potassium is normal at 4.9 which raises the concern of pseudohyperkalemia on her previous labs.  This is based on the information that a conventional metabolic panel measures serum potassium while i-STAT measures plasma potassium/whole blood potassium.  I have revised her treatment at this time to limit aggressive potassium lowering due to risk of hypokalemia.  Elmarie Shiley MD [facility]. Office # [phone] Pager # [phone] 3:20 PM

## 2021-09-15 NOTE — ED Provider Triage Note (Signed)
Emergency Medicine Provider Triage Evaluation Note  Yolanda Espinoza , a 66 y.o. female  was evaluated in triage.  Pt complains of hyperkalemia.  She had routine labs yesterday primary care and was called this morning and told to go to the emergency department due to a potassium greater than 7.  She has no physical complaints at this time.  She had a similar episode a few months ago.  Review of Systems  Positive: As above Negative: As above  Physical Exam  BP (!) 152/83 (BP Location: Right Arm)   Pulse 94   Temp 98.6 F (37 C) (Oral)   Resp 16   SpO2 100%  Gen:   Awake, no distress   Resp:  Normal effort  MSK:   Moves extremities without difficulty  Other:    Medical Decision Making  Medically screening exam initiated at 10:48 AM.  Appropriate orders placed.  Marica Otter was informed that the remainder of the evaluation will be completed by another provider, this initial triage assessment does not replace that evaluation, and the importance of remaining in the ED until their evaluation is complete.     Dorothyann Peng, PA-C 09/15/21 1049

## 2021-09-15 NOTE — ED Provider Notes (Signed)
[place] EMERGENCY DEPARTMENT Provider Note   CSN: [id number] Arrival date & time: 09/15/21  0957     History  Chief Complaint  Patient presents with   Abnormal Labs    Yolanda Espinoza is a 66 y.o. female PMH CKD stage IV, hypertension, heart failure, obesity, anemia of chronic disease, seizure disorder who is presenting with abnormal lab.  Patient reports that yesterday, she went to her PCP for routine labs and was subsequently called in for a high potassium.  Currently, patient denies any symptoms, including lightheadedness, dizziness, chest pain, palpitations, difficulty breathing, muscle cramps, diarrhea, dark tarry stools, bright red blood per rectum.  Home Medications Prior to Admission medications   Medication Sig Start Date End Date Taking? Authorizing Provider  allopurinol (ZYLOPRIM) 100 MG tablet Take 1 tablet  Daily to Prevent Gout 04/11/21   Unk Pinto, MD  amLODipine (NORVASC) 10 MG tablet TAKE ONE TABLET BY MOUTH DAILY AT 9 AM FOR BLOOD PRESSURE 04/13/21   Liane Comber, NP  aspirin EC 81 MG tablet Take 1 tablet (81 mg total) by mouth daily. 03/11/15   Nahser, Wonda Cheng, MD  cholecalciferol (VITAMIN D3) 25 MCG (1000 UT) tablet Take 1,000 Units by mouth daily.    [provider]  cyclobenzaprine (FLEXERIL) 10 MG tablet Take 1 tablet at Bedtime as Needed for Muscle Spasms                                                /                   TAKE                              BY                           MOUTH 05/28/21   Unk Pinto, MD  famotidine (PEPCID) 20 MG tablet TAKE ONE TABLET BY MOUTH DAILY AT 9 AM EVERY MORNING, 30-60 MINUTES PRIOR TO BREAKFAST FOR REFLUX. 08/26/21   Alycia Rossetti, NP  fexofenadine [facility] ALLERGY) 180 MG tablet Take 1 tablet (180 mg total) by mouth daily. 12/26/19   Liane Comber, NP  furosemide (LASIX) 40 MG tablet Take 1 tablet Daily for BP, Fluid Retention & Ankle Swelling 11/14/18   Unk Pinto, MD   levETIRAcetam (KEPPRA) 500 MG tablet Take  1 tablet   2 x /day (every 12 hours)  to Prevent Seizures. 06/25/21   Unk Pinto, MD  Multiple Vitamins-Minerals (MULTIVITAMIN WITH MINERALS) tablet Take 1 tablet by mouth daily.    [provider]  pantoprazole (PROTONIX) 40 MG tablet Take one tablet at bedtime 04/01/21   Liane Comber, NP  rosuvastatin (CRESTOR) 5 MG tablet Take 1 tab three days a week (MWF) for cholesterol. 06/02/21   Liane Comber, NP  ticagrelor (BRILINTA) 90 MG TABS tablet Take 1 tablet  2 x /day (every 12 hours )  to prevent Blood Clots 10/12/20   Liane Comber, NP      Allergies    Promethazine-dm, Ace inhibitors, and Aspirin    Review of Systems   Review of Systems As in HPI.  Physical Exam Updated Vital Signs BP (!) 150/85 (BP Location: Left Arm)   Pulse  95   Temp 98.5 F (36.9 C) (Oral)   Resp 15   SpO2 100%  Physical Exam Vitals and nursing note reviewed.  Constitutional:      General: She is not in acute distress.    Appearance: Normal appearance. She is well-developed. She is not ill-appearing or diaphoretic.  HENT:     Head: Normocephalic and atraumatic.     Right Ear: External ear normal.     Left Ear: External ear normal.     Nose: Nose normal.     Mouth/Throat:     Mouth: Mucous membranes are moist.  Cardiovascular:     Rate and Rhythm: Normal rate and regular rhythm.     Heart sounds: No murmur heard. Pulmonary:     Effort: Pulmonary effort is normal. No respiratory distress.     Breath sounds: Normal breath sounds.  Abdominal:     General: There is no distension.     Palpations: Abdomen is soft.     Tenderness: There is no abdominal tenderness. There is no guarding.  Musculoskeletal:     Cervical back: Neck supple.     Right lower leg: No edema.     Left lower leg: No edema.  Skin:    General: Skin is warm and dry.  Neurological:     Mental Status: She is alert.     ED Results / Procedures / Treatments   Labs (all  labs ordered are listed, but only abnormal results are displayed) Labs Reviewed  CBC WITH DIFFERENTIAL/PLATELET - Abnormal; Notable for the following components:      Result Value   RBC 2.98 (*)    Hemoglobin 8.0 (*)    HCT 26.4 (*)    RDW 17.4 (*)    All other components within normal limits  COMPREHENSIVE METABOLIC PANEL    EKG None  Radiology No results found.  Procedures Procedures   Medications Ordered in ED Medications - No data to display  ED Course/ Medical Decision Making/ A&P Clinical Course as of 09/15/21 1628  Thu Sep 15, 2021  1238 Comprehensive metabolic panel(!!) [BR]  9892 CBC reviewed and interpreted with stable anemia [DR]  1402 Comprehensive metabolic panel(!!) C-Met reviewed and interpreted and significant for hyperkalemia with potassium of 7.0 [DR]  1612 Ckd4, K of 7, no ekg changes, asymptomatic,temporized and shifted, neprho consulted, awaiting recs  [JG]    Clinical Course User Index [BR] Faylene Million, MD [DR] Pattricia Boss, MD [JG] Levie Heritage, MD                           Medical Decision Making Amount and/or Complexity of Data Reviewed Labs:  Decision-making details documented in ED Course.  Risk OTC drugs. Prescription drug management.   Yolanda Espinoza is a 66 y.o. female PMH CKD stage IV, hypertension, heart failure, obesity, anemia of chronic disease, seizure disorder who is presenting with hyperkalemia, 7.1 on outside labs.    Vitals at presentation within normal limits.  Patient is hemodynamically stable, afebrile, satting well on room air.  Physical exam reassuring.  Normal heart sounds.  Lungs clear to auscultation bilaterally.  Abdomen is soft, nontender to palpation.  No pitting edema.  Initial differential includes but is not limited to: Lab error, worsening CKD, medication related hyperkalemia, medication noncompliance  Lab work obtained, resulted notable for repeat potassium elevated at 7.0 without hemolysis.   Creatinine of 2.05, not significantly elevated from baseline.  No elevated LFTs.  No anion gap.  CBC without leukocytosis.  Hemoglobin 8, not significantly changed from patient's baseline.  This is likely consistent with patient's known anemia of chronic disease and poor renal function.    EKG obtained, demonstrates sinus rhythm, ventricular rate 91 bpm.  No evidence of abnormal intervals or acute ischemia.  No evidence of changes related to hyperkalemia including peaked T waves, QRS widening.  Interpreted by myself and my attending.  Interventions include IV fluid bolus, Lasix, insulin, dextrose, [employer]  Nephrology consulted regarding patient's hyperkalemia.  Per chart review, patient has experienced similar presentation before.  Nephrology will plan to evaluate the patient, ultimate recommendations are pending and will determine patient's disposition.  Patient's care handed off to Dr. Maryjo Rochester.  Please see his note for the remainder of patient's care in ED.  The plan for this patient was discussed with Dr. Jeanell Sparrow, who voiced agreement and who oversaw evaluation and treatment of this patient.   Final Clinical Impression(s) / ED Diagnoses Final diagnoses:  Hyperkalemia    Rx / DC Orders ED Discharge Orders     None         Faylene Million, MD 09/15/21 1629    Pattricia Boss, MD 09/16/21 [phone]

## 2021-09-15 NOTE — ED Triage Notes (Signed)
Pt presents after receiving call from her doctor for high potassium. No complaints at this time.

## 2021-09-15 NOTE — ED Provider Notes (Signed)
Is a 66 year old female past medical history of hypertension, hyperlipidemia, CHF, otherwise CKD 3 on 4 presents the emergency department with hyperkalemia detected by PCP.  Patient was temporized and shifted by prior team.  Nephrology was consulted by prior team. Physical Exam  BP (!) 152/87   Pulse 77   Temp 98 F (36.7 C) (Oral)   Resp 10   SpO2 100%   Physical Exam  Procedures  Procedures  ED Course / MDM   Clinical Course as of 09/15/21 1616  Thu Sep 15, 2021  1238 Comprehensive metabolic panel(!!) [BR]  5093 CBC reviewed and interpreted with stable anemia [DR]  1402 Comprehensive metabolic panel(!!) C-Met reviewed and interpreted and significant for hyperkalemia with potassium of 7.0 [DR]  1612 Ckd4, K of 7, no ekg changes, asymptomatic,temporized and shifted, neprho consulted, awaiting recs  [JG]    Clinical Course User Index [BR] Faylene Million, MD [DR] Pattricia Boss, MD [JG] Levie Heritage, MD   Medical Decision Making Amount and/or Complexity of Data Reviewed Labs:  Decision-making details documented in ED Course.  Risk OTC drugs. Prescription drug management. Decision regarding hospitalization.   Assessment the time of interval visit the patient is hemodynamically stable and in no acute distress.  Patient was ultimately admitted to medicine given likely impending new dialysis need and lack of dialysis access.       Levie Heritage, MD 09/15/21 2671    Pattricia Boss, MD 09/16/21 [phone]

## 2021-09-15 NOTE — Consult Note (Addendum)
Reason for Consult: Hyperkalemia Referring Physician: Pryor Curia, MD (EDP)  HPI:  66 year old woman with past medical history significant for hypertension, dyslipidemia, chronic diastolic heart failure, obstructive sleep apnea on CPAP, history of seizure disorder, history of CVA without residual neurological deficit, history of subarachnoid hemorrhage due to ruptured intracranial aneurysm status post coiling and chronic kidney disease stage IIIb-IV at baseline (creatinine 1.7-2.0).  She had labs done yesterday at her primary care provider's office that were significant for hyperkalemia of 7.1 up from 6.1 one week ago for which she was directed to the emergency room.  She denies any active complaints at this time and feels that she is in her usual state of health.  She reports that she was not following any dietary restrictions with regards to potassium in spite of having had documented hyperkalemia in the past.  She denies any local tissue injury, abdominal pain, problems with urination, nausea, vomiting or diarrhea.  She denies any chest pain or shortness of breath and does not have any palpitations.  Also of note, she had undetectable/subtherapeutic levetiracetam levels last week for which she received additional instructions by Dr. Melford Aase.  She denies using any NSAIDs or any homeopathic medications.  She was previously under the care of Dr. Marval Regal at [facility] and was lost to follow-up back in 2018 with several attempts to reschedule her in the office since then noted resulting in "no-show".  Past Medical History:  Diagnosis Date   Anemia    Arthritis    Chronic diastolic heart failure (HCC)    GERD (gastroesophageal reflux disease)    Heart murmur    Hyperlipidemia    Hypertension may, 1973   Mitral regurgitation    a. Echo 01/2012 mild LVH, EF 55-65%, Gr 2 DD, mod MR, mild LAE, PASP 36;  b. Echo (02/2013):  EF 55-60%, Gr 1 DD, mild to mod MR, mild LAE (no sig change since  prior echo) // c. Echo 10/18: EF 55-60, normal wall motion, grade 2 diastolic dysfunction, trivial AI, mild to moderate MR, mild LAE, trivial PI, PASP 38     OSA on CPAP    7 yrs   Seizures ([place]) 01/2017   Stroke ([place]) 07   no weakness jan 29th 2019 no defecits   Subarachnoid hemorrhage due to ruptured aneurysm ([place])    2003 - s/p coiling   Unspecified vitamin D deficiency     Past Surgical History:  Procedure Laterality Date   ABDOMINAL HYSTERECTOMY     complete   ANEURYSM COILING     COLONOSCOPY WITH PROPOFOL N/A 09/05/2018   Procedure: COLONOSCOPY WITH PROPOFOL;  Surgeon: Juanita Craver, MD;  Location: [facility] ENDOSCOPY;  Service: Endoscopy;  Laterality: N/A;   ESOPHAGOGASTRODUODENOSCOPY (EGD) WITH PROPOFOL N/A 09/05/2018   Procedure: ESOPHAGOGASTRODUODENOSCOPY (EGD) WITH PROPOFOL;  Surgeon: Juanita Craver, MD;  Location: [facility] ENDOSCOPY;  Service: Endoscopy;  Laterality: N/A;   GIVENS CAPSULE STUDY N/A 10/17/2018   Procedure: GIVENS CAPSULE STUDY;  Surgeon: Juanita Craver, MD;  Location: [facility] ENDOSCOPY;  Service: Endoscopy;  Laterality: N/A;   head surgery     aneurysm ruptured- stroke   IR 3D INDEPENDENT WKST  09/04/2016   IR ANGIO INTRA EXTRACRAN SEL COM CAROTID INNOMINATE BILAT MOD SED  09/04/2016   IR ANGIO INTRA EXTRACRAN SEL INTERNAL CAROTID UNI L MOD SED  02/21/2017   IR ANGIO VERTEBRAL SEL VERTEBRAL BILAT MOD SED  09/04/2016   IR ANGIOGRAM EXTREMITY LEFT  09/04/2016   IR ANGIOGRAM FOLLOW UP STUDY  02/21/2017   IR NEURO EACH ADD'L AFTER BASIC UNI LEFT (MS)  02/21/2017   IR RADIOLOGIST EVAL & MGMT  10/11/2016   IR RADIOLOGIST EVAL & MGMT  03/07/2017   IR TRANSCATH/EMBOLIZ  02/21/2017   RADIOLOGY WITH ANESTHESIA N/A 04/23/2013   Procedure: RADIOLOGY WITH ANESTHESIA;  Surgeon: Rob Hickman, MD;  Location: [place];  Service: Radiology;  Laterality: N/A;   RADIOLOGY WITH ANESTHESIA N/A 10/30/2016   Procedure: EMBOLIZATION;  Surgeon: Luanne Bras, MD;  Location: [place] [facility];  Service: Radiology;   Laterality: N/A;   RADIOLOGY WITH ANESTHESIA N/A 02/21/2017   Procedure: EMBOLIZATION;  Surgeon: Luanne Bras, MD;  Location: [place];  Service: Radiology;  Laterality: N/A;   TONSILLECTOMY AND ADENOIDECTOMY     TUBAL LIGATION      Family History  Problem Relation Age of Onset   Hypertension Father    Kidney disease Father    Hypertension Sister    Cancer Sister 105       Colon cancer   Hypertension Sister    Hypertension Sister    Sleep apnea Neg Hx     Social History:  reports that she quit smoking about 22 years ago. Her smoking use included cigarettes. She has a 0.75 pack-year smoking history. She has never used smokeless tobacco. She reports that she does not drink alcohol and does not use drugs.  Allergies:  Allergies  Allergen Reactions   Promethazine-Dm     Palpitations   Ace Inhibitors Nausea And Vomiting   Aspirin Nausea And Vomiting    Can take coated Aspirin    Medications: I have reviewed the patient's current medications. Scheduled:  patiromer  8.4 g Oral Q4H      Latest Ref Rng & Units 09/15/2021   10:54 AM 09/14/2021   12:14 PM 09/07/2021   10:26 AM  BMP  Glucose 70 - 99 mg/dL 86  88  89   BUN 8 - 23 mg/dL 33  36  34   Creatinine 0.44 - 1.00 mg/dL 2.05  1.95  1.67   BUN/Creat Ratio 6 - 22 (calc)  18  20   Sodium 135 - 145 mmol/L 133  138  140   Potassium 3.5 - 5.1 mmol/L 7.0  C 7.1  6.1   Chloride 98 - 111 mmol/L 108  109  108   CO2 22 - 32 mmol/L 19  24  25    Calcium 8.9 - 10.3 mg/dL 9.6  10.3  10.1     C Corrected result      Latest Ref Rng & Units 09/15/2021   10:54 AM 09/14/2021   12:14 PM 09/07/2021   10:26 AM  CBC  WBC 4.0 - 10.5 K/uL 8.4  7.8  8.8   Hemoglobin 12.0 - 15.0 g/dL 8.0  8.5  8.5   Hematocrit 36.0 - 46.0 % 26.4  26.8  27.3   Platelets 150 - 400 K/uL 343  351  316      Review of Systems  Constitutional: Negative.   HENT: Negative.    Eyes: Negative.   Respiratory: Negative.    Cardiovascular: Negative.    Gastrointestinal: Negative.   Endocrine: Negative.   Genitourinary: Negative.   Musculoskeletal: Negative.   Skin: Negative.   Allergic/Immunologic: Negative.   Neurological: Negative.   Psychiatric/Behavioral: Negative.     Blood pressure (!) 168/90, pulse 83, temperature 98.5 F (36.9 C), temperature source Oral, resp. rate 14, SpO2 100 %. Physical Exam Vitals reviewed.  Constitutional:  General: She is not in acute distress.    Appearance: Normal appearance. She is obese. She is not ill-appearing.  HENT:     Head: Normocephalic and atraumatic.     Right Ear: External ear normal.     Left Ear: External ear normal.     Nose: Nose normal.     Mouth/Throat:     Mouth: Mucous membranes are dry.     Pharynx: Oropharynx is clear.  Eyes:     General: No scleral icterus.    Extraocular Movements: Extraocular movements intact.     Conjunctiva/sclera: Conjunctivae normal.  Cardiovascular:     Rate and Rhythm: Normal rate.     Pulses: Normal pulses.     Heart sounds: Normal heart sounds. No murmur heard. Pulmonary:     Effort: Pulmonary effort is normal.     Breath sounds: Normal breath sounds. No wheezing or rales.  Abdominal:     General: Abdomen is flat.     Palpations: Abdomen is soft.     Tenderness: There is no abdominal tenderness. There is no guarding.  Musculoskeletal:     Cervical back: Normal range of motion and neck supple.     Right lower leg: Edema present.     Left lower leg: Edema present.     Comments: Trace ankle edema  Skin:    General: Skin is warm and dry.     Findings: No bruising or lesion.  Neurological:     Mental Status: She is alert and oriented to person, place, and time. Mental status is at baseline.     Assessment/Plan: 1.  Hyperkalemia: Likely from a renal tubular handling defect in the setting of chronic kidney disease stage IIIb/IV compounded by lack of dietary potassium restriction.  I will check urine potassium/creatinine to  determine a fractional excretion of potassium for better understanding of her mechanism (unfortunately, this might be affected by earlier administration of furosemide).  I will send her for CPK levels to confirm that she is not having tissue injury from mild rhabdomyolysis/subclinical seizures given her undetectable Keppra level.  I agree with treatment that has acutely been instituted in the emergency department and will start her on scheduled patiromer to lower potassium without adding onto sodium burden.  She does not have any cardiac dysrhythmias or acute EKG changes noted to prompt need to institute dialysis at this time and we will attempt to maximize medical management at this point. I reviewed her medication list again and this was negative for any potassium sparing diuretics, RAAS blockade or NSAIDs. 2.  Chronic kidney disease stage IIIb-IV: Appears to be related to underlying hypertension versus ischemic nephropathy.  Agree with intravenous fluids cautiously at this time for volume expansion to promote diuresis and kaliuresis.  Monitor closely in the setting of diastolic heart failure. 3.  Hypertension: Elevated blood pressure noted, resume amlodipine at her home dose along with forced diuresis with fluids/furosemide.  Monitor blood pressure trend to decide on additional need for antihypertensive agents. 4.  History of seizure disorder: Resume levetiracetam at outpatient dose. 5.  Anemia: Likely secondary to chronic kidney disease however in the setting of acute (on chronic) hyperkalemia, would be prudent to rule out hemolysis with an LDH and haptoglobin level to verify that she does not have a hemolytic etiology.  We will send off iron studies.  Nezzie Manera K. 09/15/2021, 2:48 PM

## 2021-09-15 NOTE — ED Notes (Signed)
Patient ambulates to the restroom at this time 

## 2021-09-16 DIAGNOSIS — Z8673 Personal history of transient ischemic attack (TIA), and cerebral infarction without residual deficits: Secondary | ICD-10-CM | POA: Diagnosis not present

## 2021-09-16 DIAGNOSIS — E559 Vitamin D deficiency, unspecified: Secondary | ICD-10-CM | POA: Diagnosis present

## 2021-09-16 DIAGNOSIS — I10 Essential (primary) hypertension: Secondary | ICD-10-CM | POA: Diagnosis not present

## 2021-09-16 DIAGNOSIS — Z8249 Family history of ischemic heart disease and other diseases of the circulatory system: Secondary | ICD-10-CM | POA: Diagnosis not present

## 2021-09-16 DIAGNOSIS — N184 Chronic kidney disease, stage 4 (severe): Secondary | ICD-10-CM | POA: Diagnosis not present

## 2021-09-16 DIAGNOSIS — Z7982 Long term (current) use of aspirin: Secondary | ICD-10-CM | POA: Diagnosis not present

## 2021-09-16 DIAGNOSIS — K219 Gastro-esophageal reflux disease without esophagitis: Secondary | ICD-10-CM | POA: Diagnosis present

## 2021-09-16 DIAGNOSIS — Z841 Family history of disorders of kidney and ureter: Secondary | ICD-10-CM | POA: Diagnosis not present

## 2021-09-16 DIAGNOSIS — I5032 Chronic diastolic (congestive) heart failure: Secondary | ICD-10-CM | POA: Diagnosis not present

## 2021-09-16 DIAGNOSIS — M1990 Unspecified osteoarthritis, unspecified site: Secondary | ICD-10-CM | POA: Diagnosis present

## 2021-09-16 DIAGNOSIS — Z87891 Personal history of nicotine dependence: Secondary | ICD-10-CM | POA: Diagnosis not present

## 2021-09-16 DIAGNOSIS — Z6830 Body mass index (BMI) 30.0-30.9, adult: Secondary | ICD-10-CM | POA: Diagnosis not present

## 2021-09-16 DIAGNOSIS — G4733 Obstructive sleep apnea (adult) (pediatric): Secondary | ICD-10-CM | POA: Diagnosis present

## 2021-09-16 DIAGNOSIS — Z79899 Other long term (current) drug therapy: Secondary | ICD-10-CM | POA: Diagnosis not present

## 2021-09-16 DIAGNOSIS — E875 Hyperkalemia: Secondary | ICD-10-CM | POA: Diagnosis not present

## 2021-09-16 DIAGNOSIS — G40909 Epilepsy, unspecified, not intractable, without status epilepticus: Secondary | ICD-10-CM | POA: Diagnosis present

## 2021-09-16 DIAGNOSIS — Z90710 Acquired absence of both cervix and uterus: Secondary | ICD-10-CM | POA: Diagnosis not present

## 2021-09-16 DIAGNOSIS — Z9851 Tubal ligation status: Secondary | ICD-10-CM | POA: Diagnosis not present

## 2021-09-16 DIAGNOSIS — D631 Anemia in chronic kidney disease: Secondary | ICD-10-CM | POA: Diagnosis present

## 2021-09-16 DIAGNOSIS — Z888 Allergy status to other drugs, medicaments and biological substances status: Secondary | ICD-10-CM | POA: Diagnosis not present

## 2021-09-16 DIAGNOSIS — N1832 Chronic kidney disease, stage 3b: Secondary | ICD-10-CM | POA: Diagnosis present

## 2021-09-16 DIAGNOSIS — I130 Hypertensive heart and chronic kidney disease with heart failure and stage 1 through stage 4 chronic kidney disease, or unspecified chronic kidney disease: Secondary | ICD-10-CM | POA: Diagnosis not present

## 2021-09-16 DIAGNOSIS — Z7902 Long term (current) use of antithrombotics/antiplatelets: Secondary | ICD-10-CM | POA: Diagnosis not present

## 2021-09-16 DIAGNOSIS — D649 Anemia, unspecified: Secondary | ICD-10-CM | POA: Diagnosis not present

## 2021-09-16 DIAGNOSIS — D638 Anemia in other chronic diseases classified elsewhere: Secondary | ICD-10-CM | POA: Diagnosis not present

## 2021-09-16 DIAGNOSIS — E785 Hyperlipidemia, unspecified: Secondary | ICD-10-CM | POA: Diagnosis present

## 2021-09-16 DIAGNOSIS — M109 Gout, unspecified: Secondary | ICD-10-CM | POA: Diagnosis present

## 2021-09-16 LAB — BASIC METABOLIC PANEL
Anion gap: 10 (ref 5–15)
Anion gap: 11 (ref 5–15)
BUN: 31 mg/dL — ABNORMAL HIGH (ref 8–23)
BUN: 32 mg/dL — ABNORMAL HIGH (ref 8–23)
CO2: 21 mmol/L — ABNORMAL LOW (ref 22–32)
CO2: 20 mmol/L — ABNORMAL LOW (ref 22–32)
Calcium: 10.2 mg/dL (ref 8.9–10.3)
Calcium: 10.1 mg/dL (ref 8.9–10.3)
Chloride: 105 mmol/L (ref 98–111)
Chloride: 108 mmol/L (ref 98–111)
Creatinine, Ser: 1.84 mg/dL — ABNORMAL HIGH (ref 0.44–1.00)
Creatinine, Ser: 1.86 mg/dL — ABNORMAL HIGH (ref 0.44–1.00)
GFR, Estimated: 30 mL/min — ABNORMAL LOW (ref >60)
GFR, Estimated: 30 mL/min — ABNORMAL LOW (ref >60)
Glucose, Bld: 87 mg/dL (ref 70–99)
Glucose, Bld: 119 mg/dL — ABNORMAL HIGH (ref 70–99)
Potassium: 5.9 mmol/L — ABNORMAL HIGH (ref 3.5–5.1)
Potassium: 5.5 mmol/L — ABNORMAL HIGH (ref 3.5–5.1)
Sodium: 136 mmol/L (ref 135–145)
Sodium: 139 mmol/L (ref 135–145)

## 2021-09-16 LAB — RENAL FUNCTION PANEL
Albumin: 3.8 g/dL (ref 3.5–5.0)
Anion gap: 7 (ref 5–15)
BUN: 34 mg/dL — ABNORMAL HIGH (ref 8–23)
CO2: 22 mmol/L (ref 22–32)
Calcium: 9.9 mg/dL (ref 8.9–10.3)
Chloride: 106 mmol/L (ref 98–111)
Creatinine, Ser: 1.76 mg/dL — ABNORMAL HIGH (ref 0.44–1.00)
GFR, Estimated: 32 mL/min — ABNORMAL LOW (ref >60)
Glucose, Bld: 87 mg/dL (ref 70–99)
Phosphorus: 5.9 mg/dL — ABNORMAL HIGH (ref 2.5–4.6)
Potassium: 5.8 mmol/L — ABNORMAL HIGH (ref 3.5–5.1)
Sodium: 135 mmol/L (ref 135–145)

## 2021-09-16 LAB — HAPTOGLOBIN: Haptoglobin: 136 mg/dL (ref 37–355)

## 2021-09-16 LAB — HIV ANTIBODY (ROUTINE TESTING W REFLEX): HIV Screen 4th Generation wRfx: NONREACTIVE

## 2021-09-16 MED ORDER — SODIUM ZIRCONIUM CYCLOSILICATE 10 G PO PACK
10.0000 g | PACK | Freq: Once | ORAL | Status: AC
  Administered 2021-09-16: 10 g via ORAL
  Filled 2021-09-16: qty 1

## 2021-09-16 MED ORDER — CHLORTHALIDONE 25 MG PO TABS
25.0000 mg | ORAL_TABLET | Freq: Every day | ORAL | Status: DC
  Administered 2021-09-16 – 2021-09-18 (×3): 25 mg via ORAL
  Filled 2021-09-16 (×3): qty 1

## 2021-09-16 MED ORDER — AMLODIPINE BESYLATE 5 MG PO TABS
5.0000 mg | ORAL_TABLET | Freq: Every day | ORAL | Status: DC
  Administered 2021-09-17 – 2021-09-18 (×2): 5 mg via ORAL
  Filled 2021-09-16 (×2): qty 1

## 2021-09-16 NOTE — Progress Notes (Signed)
Nutrition Education Note  RD consulted for Renal Education. Placed Potassium diet education and Renal Food Pyramid in discharge instructions. Recommend outpatient referral.   Body mass index is 34.01 kg/m. Pt meets criteria for obese based on current BMI.  Current diet order is Renal, no meal completions documented. Labs and medications reviewed.   No further nutrition interventions warranted at this time. RD contact information provided. If additional nutrition issues arise, please re-consult RD.   Hermina Barters RD, LDN Clinical Dietitian See Shea Evans for contact information.

## 2021-09-16 NOTE — Progress Notes (Signed)
  Transition of Care [facility]) Screening Note   Patient Details  Name: IVANA NICASTRO Date of Birth: 1955/09/11   Transition of Care [facility]) CM/SW Contact:    Pollie Friar, RN Phone Number: 09/16/2021, 3:35 PM    Transition of Care Department [facility]) has reviewed patient. We will continue to monitor patient advancement through interdisciplinary progression rounds. If new patient transition needs arise, please place a TOC consult.

## 2021-09-16 NOTE — Progress Notes (Signed)
PROGRESS NOTE    Yolanda Espinoza  YTK:[id number] DOB: 1955/05/31 DOA: 09/15/2021 PCP: Unk Pinto, MD    Chief Complaint  Patient presents with   Abnormal Labs    Brief Narrative:    Yolanda Espinoza  is a 66 y.o. female, with significant past medical history for hypertension, hyperlipidemia, chronic diastolic CHF, obstructive sleep apnea on CPAP, history of seizure disorders, history of brain aneurysm, subarachnoid hemorrhage, CVA, chronic kidney disease stage IIIb/IV, baseline creatinine 1.7-2, patient was sent by her PCP for hyperkalemia, blood work showing potassium of 6.1 last week, repeat today at PCP office showing a potassium of 7.1, so patient was directed to come to ED for further management, patient herself denies any complaints, reports she is at her usual state of health, compliant with her medications, she is not following any dietary restriction, no nausea, no vomiting, no diarrhea -In ED repeat labs confirmed potassium of 7, for which patient has received Lasix, D50 with IV insulin and calcium gluconate, repeat labs on i-STAT showing potassium of 4.9, patient was seen and evaluated by renal she had ordered Veltassa, and [facility] hospitalist consulted to admit.  Assessment & Plan:   Principal Problem:   Hyperkalemia Active Problems:   Essential hypertension   Chronic diastolic heart failure (HCC)   OSA on CPAP   Vitamin D deficiency   Hyperlipidemia   Morbid obesity (HCC) - BMI 30+ with OSA   Gout   Gastroesophageal reflux disease   Anemia of chronic disease- renal      Hyperkalemia -This is most likely due to her chronic kidney disease, as well he does appear to be on Benicar as an outpatient -Management per renal, it was felt to be secondary to CKD, NSAIDs use and ARB -She did receive Lokelma, Veltassa yesterday despite with initial improvement, potassium is trending up currently -Continue to hold ARB's, and all NSAIDs and do not restart -Will be started on  chlorthalidone to augment kaliuresis, so we will go ahead and decrease her amlodipine -As needed Lokelma for potassium more than 6 -Recheck BMP tomorrow  CKD stage IIIb/IV -Appears to be due to underlying hypertension, versus ischemic nephropathy -Continue with gentle hydration to allow for diuresis   Hypertension  - blood pressure acceptable, continue with home medication amlodipine, Benicar remains on hold   Anemia of chronic kidney disease, -At baseline   History of cerebral aneurysmS/p pipeline flow diverter across x2 Lt ICA  paraophthalmic aneurysms and CVA -He is following with IR Dr. Estanislado Pandy as an outpatient, she remains on aspirin and Brilinta which will be continued   History of gout -continue with allopurinol   GERD -Continue with PPI/famotidine   Hyperlipidemia -Continue with Crestor   History of seizures -Continue with Keppra   OSA - continue with CPAP   Chronic diastolic CHF -Euvolemic, monitor closely as on IV fluids(for possible need for further IV Lasix for hyperkalemia).      DVT prophylaxis: SCD>> will start on subcu heparin if remains another 24 hours in the hospital Code Status: Full Family Communication: none at bedside Disposition:   Status is: inpatient   Consultants:  renal   Subjective:  No significant events overnight, she denies any complaints today. Objective: Vitals:   09/15/21 2324 09/16/21 0418 09/16/21 0813 09/16/21 1222  BP: 130/78 125/81 134/88 (!) 144/88  Pulse: 71 83 89 86  Resp: 15 13 15 15   Temp: 98.2 F (36.8 C) 97.7 F (36.5 C)  97.8 F (36.6 C)  TempSrc: Axillary Oral  Oral  SpO2: 99% 99% 99% 99%  Weight:      Height:        Intake/Output Summary (Last 24 hours) at 09/16/2021 1507 Last data filed at 09/15/2021 1716 Gross per 24 hour  Intake --  Output 1000 ml  Net -1000 ml   Filed Weights   09/15/21 2104  Weight: 107.5 kg    Examination:  General exam: Appears calm and comfortable  Respiratory  system: Clear to auscultation. Respiratory effort normal. Cardiovascular system: S1 & S2 heard, RRR. No JVD, murmurs, rubs, gallops or clicks. No pedal edema. Gastrointestinal system: Abdomen is nondistended, soft and nontender. No organomegaly or masses felt. Normal bowel sounds heard. Central nervous system: Alert and oriented. No focal neurological deficits. Extremities: Symmetric 5 x 5 power. Skin: No rashes, lesions or ulcers Psychiatry: Judgement and insight appear normal. Mood & affect appropriate.     Data Reviewed: I have personally reviewed following labs and imaging studies  CBC: Recent Labs  Lab 09/14/21 1214 09/15/21 1054 09/15/21 1507 09/15/21 1839  WBC 7.8 8.4  --   --   NEUTROABS 3,455 4.5  --   --   HGB 8.5* 8.0* 8.8* 8.8*  HCT 26.8* 26.4* 26.0* 26.0*  MCV 84.0 88.6  --   --   PLT 351 343  --   --     Basic Metabolic Panel: Recent Labs  Lab 09/14/21 1214 09/15/21 0957 09/15/21 1054 09/15/21 1507 09/15/21 1839 09/16/21 0454  NA 138 136 133* 137 137 135  136  K 7.1* 5.4* 7.0* 4.9 5.4* 5.8*  5.9*  CL 109 114* 108 108 106 106  105  CO2 24 16* 19*  --   --  22  21*  GLUCOSE 88 102* 86 77 116* 87  87  BUN 36* 30* 33* 33* 32* 34*  31*  CREATININE 1.95* 1.59* 2.05* 1.90* 2.00* 1.76*  1.84*  CALCIUM 10.3 7.9* 9.6  --   --  9.9  10.2  PHOS  --   --   --   --   --  5.9*    GFR: Estimated Creatinine Clearance: 42.3 mL/min (A) (by C-G formula based on SCr of 1.76 mg/dL (H)).  Liver Function Tests: Recent Labs  Lab 09/14/21 1214 09/15/21 1054 09/16/21 0454  AST 14 18  --   ALT 12 12  --   ALKPHOS  --  67  --   BILITOT 0.2 0.3  --   PROT 7.6 7.4  --   ALBUMIN  --  4.0 3.8    CBG: Recent Labs  Lab 09/15/21 1314 09/15/21 1455  GLUCAP 87 80     No results found for this or any previous visit (from the past 240 hour(s)).       Radiology Studies: No results found.      Scheduled Meds:  allopurinol  100 mg Oral Daily   [START  ON 09/17/2021] amLODipine  5 mg Oral Daily   aspirin EC  81 mg Oral Daily   chlorthalidone  25 mg Oral Daily   cholecalciferol  1,000 Units Oral Daily   famotidine  20 mg Oral QHS   levETIRAcetam  1,000 mg Oral BID   pantoprazole  40 mg Oral Daily   rosuvastatin  5 mg Oral Q48H   ticagrelor  90 mg Oral BID   Continuous Infusions:   LOS: 0 days        Phillips Climes, MD [facility]   To contact the attending  provider between 7A-7P or the covering provider during after hours 7P-7A, please log into the web site [email or web address] and access using universal [place] password for that web site. If you do not have the password, please call the hospital operator.  09/16/2021, 3:07 PM

## 2021-09-16 NOTE — Discharge Instructions (Signed)
Potassium and Kidney Disease The importance of potassium  Many foods contain potassium, a mineral that is critical for maintaining a normal heartbeat and muscle function. Your kidneys are responsible for regulating a safe level of potassium in your body. Without proper kidney function, you must limit foods that are high in potassium to prevent your potassium level from getting too high. High levels of potassium pose serious health threats, including irregular heartbeat or heart attack. Be aware of any weakness, numbness or tingling, as these can be indicators of high potassium. Personal management of potassium in your diet Your dietitian will assist you in understanding which foods are safe to include on a renal diet. It is important to limit foods that are high in potassium.  TIPS: Follow your personal treatment or exchange prescription, if on dialysis. Avoid liquids from canned fruits, vegetables and juices from cooked meats. Be cautious of serving sizes to avoid accidently overconsuming potassium  Occasional inclusion of high potassium vegetables is okay if you leach them first, but first speak with your dietitian to approve the amount that are safe for you. This is a way that vegetables can be made safer for by pulling some of the potassium from them.    Maintaining a safe level of potassium Talk with your doctor or dietitian and monitor your monthly potassium level  SAFE ZONE CAUTION ZONE DANGER ZONE  3.5-5.0 5.1-6.0 Higher than 6.0   How to leach vegetables  (potatoes, sweet potatoes, carrots, beets,  winter squash, rutabaga)  Peal and submerge vegetables in water Slice vegetables to 1/8 inch Rinse vegetables under warm water for a few seconds  Soak in warm water for at least 2 hours, using 10 xs the amount of water to vegetables Rinse vegetables under warm water, again Cook vegetables with 5 xs the amount of water to vegetables    LIMIT High Potassium   Dairy:  Milk   Yogurt    Fruit:  Apricots  (raw)       Banana Cantaloupe Dates Dried Figs Guava Honeydew Grapefruit Juice Kiwi Mango Nectarine Orange Papaya  Pear  Pomegranate Pomegranate Juice Prunes Prune Juice Raisins Most Dried Fruit   Vegetables:   Artichoke  Bamboo shoots Greens (except Kale) Broccoli, cooked Brussel Sprouts Chard Chinese Cabbage Carrots Kohlrabi  Lentils Legumes White Potatoes Sweet Potatoes Wild Mushrooms Okra Parsnips Pumpkin Rutabaga Winter Squash Tomato Products Vegetable Juice   Other:  Nuts & Seeds Peanut Butter Bran products Granola Salt Substitute Salt free broth Chocolate Molasses  Snuff/Chewing Tobacco  *check with your doctor about  nutritional supplements   INCLUDE Low Potassium  Fruit: Apple products Apricots, canned  Blackberry Blueberry Cherries Cranberries Fruit cocktail Grapes Grapefruit? Mandarin oranges Peaches (fresh or      cup canned) Pears (1 fresh or  cup canned) Pineapple Plum  Raspberry Strawberry Tangerine Watermelon     Other:  Rice Noodles Pasta Bread products   (not whole grain) Cake:      angel or yellow Pies: without     chocolate or high      potassium fruit Cookies: without    chocolate or nuts Coffee (limit 8 oz) Tea (limit 16 oz) Vegetables: Alfalfa sprouts Asparagus(6 spears) Beans (green/wax) Broccoli (raw or cooked from, frozen) Cabbage (green, red) Cabbage(green, red) Carrots, cooked Cauliflower Celery (1 stalk) Corn (1/2 ear fresh, or  cup frozen) Cucumber Eggplant Kale  Lettuce  Mixed Vegetables White Mushrooms Onions Parsley Peas, green Peppers Radish Rhubarb Water Chestnuts Watercress Yellow Squash Zucchini [place]

## 2021-09-16 NOTE — Progress Notes (Signed)
Admit: 09/15/2021 LOS: 0  40F CKD3b with hyperkalemia  Subjective:  Seems was taking olmesartan and diclofenac prior to admissoin K 5.8 this AM rec Lokelma BPs normal on only amlodipine Not obviously taking in heavy PO K in food  08/24 0701 - 08/25 0700 In: 500 [IV Piggyback:500] Out: 1000 [Urine:1000]  Filed Weights   09/15/21 2104  Weight: 107.5 kg    Scheduled Meds:  allopurinol  100 mg Oral Daily   amLODipine  10 mg Oral Daily   aspirin EC  81 mg Oral Daily   cholecalciferol  1,000 Units Oral Daily   famotidine  20 mg Oral QHS   levETIRAcetam  1,000 mg Oral BID   pantoprazole  40 mg Oral Daily   rosuvastatin  5 mg Oral Q48H   ticagrelor  90 mg Oral BID   Continuous Infusions: PRN Meds:.acetaminophen **OR** acetaminophen, hydrALAZINE  Current Labs: reviewed    Physical Exam:  Blood pressure (!) 144/88, pulse 86, temperature 97.8 F (36.6 C), temperature source Oral, resp. rate 15, height 5\' 10"  (1.778 m), weight 107.5 kg, SpO2 99 %. NAD walking aorund in room CTAB RRR S/nt/nd No sig LEE  A Hyperkalemia 2/2 low GFR + NSAIDS + ARB CKD3b A2, prev saw [facility] at [place], lost to f/u 2018 HTN, BPs ok on amlodipine DM2 HLD Hx/o CVA Hx/o SAH  P Cont to hold ARB and all NSAIDs do not restart Reduce amlodipine to 5mg /d Add chlorthalideon 25mg /d to augment kalliuresis Lokelma if K > 6 If K < 5.5 tomorrow ok for DC Will offer to have her return to [employer] MD 09/16/2021, 1:27 PM  Recent Labs  Lab 09/15/21 0957 09/15/21 1054 09/15/21 1507 09/15/21 1839 09/16/21 0454  NA 136 133* 137 137 135  136  K 5.4* 7.0* 4.9 5.4* 5.8*  5.9*  CL 114* 108 108 106 106  105  CO2 16* 19*  --   --  22  21*  GLUCOSE 102* 86 77 116* 87  87  BUN 30* 33* 33* 32* 34*  31*  CREATININE 1.59* 2.05* 1.90* 2.00* 1.76*  1.84*  CALCIUM 7.9* 9.6  --   --  9.9  10.2  PHOS  --   --   --   --  5.9*   Recent Labs  Lab 09/14/21 1214 09/15/21 1054  09/15/21 1507 09/15/21 1839  WBC 7.8 8.4  --   --   NEUTROABS 3,455 4.5  --   --   HGB 8.5* 8.0* 8.8* 8.8*  HCT 26.8* 26.4* 26.0* 26.0*  MCV 84.0 88.6  --   --   PLT 351 343  --   --

## 2021-09-17 DIAGNOSIS — I10 Essential (primary) hypertension: Secondary | ICD-10-CM | POA: Diagnosis not present

## 2021-09-17 DIAGNOSIS — E875 Hyperkalemia: Secondary | ICD-10-CM | POA: Diagnosis not present

## 2021-09-17 DIAGNOSIS — D638 Anemia in other chronic diseases classified elsewhere: Secondary | ICD-10-CM | POA: Diagnosis not present

## 2021-09-17 LAB — BASIC METABOLIC PANEL
Anion gap: 8 (ref 5–15)
BUN: 38 mg/dL — ABNORMAL HIGH (ref 8–23)
CO2: 21 mmol/L — ABNORMAL LOW (ref 22–32)
Calcium: 10.1 mg/dL (ref 8.9–10.3)
Chloride: 109 mmol/L (ref 98–111)
Creatinine, Ser: 1.83 mg/dL — ABNORMAL HIGH (ref 0.44–1.00)
GFR, Estimated: 30 mL/min — ABNORMAL LOW (ref >60)
Glucose, Bld: 90 mg/dL (ref 70–99)
Potassium: 6.1 mmol/L — ABNORMAL HIGH (ref 3.5–5.1)
Sodium: 138 mmol/L (ref 135–145)

## 2021-09-17 MED ORDER — HEPARIN SODIUM (PORCINE) 5000 UNIT/ML IJ SOLN
5000.0000 [IU] | Freq: Three times a day (TID) | INTRAMUSCULAR | Status: DC
  Administered 2021-09-17 – 2021-09-18 (×3): 5000 [IU] via SUBCUTANEOUS
  Filled 2021-09-17 (×3): qty 1

## 2021-09-17 MED ORDER — SODIUM ZIRCONIUM CYCLOSILICATE 10 G PO PACK
10.0000 g | PACK | Freq: Once | ORAL | Status: AC
  Administered 2021-09-17: 10 g via ORAL
  Filled 2021-09-17: qty 1

## 2021-09-17 NOTE — Progress Notes (Signed)
Admit: 09/15/2021 LOS: 1  56F CKD3b with hyperkalemia  Subjective:  K 6.1 this AM GFR stable Rec Lokelma this AM  No intake/output data recorded.  Filed Weights   09/15/21 2104  Weight: 107.5 kg    Scheduled Meds:  allopurinol  100 mg Oral Daily   amLODipine  5 mg Oral Daily   aspirin EC  81 mg Oral Daily   chlorthalidone  25 mg Oral Daily   cholecalciferol  1,000 Units Oral Daily   famotidine  20 mg Oral QHS   levETIRAcetam  1,000 mg Oral BID   pantoprazole  40 mg Oral Daily   rosuvastatin  5 mg Oral Q48H   ticagrelor  90 mg Oral BID   Continuous Infusions: PRN Meds:.acetaminophen **OR** acetaminophen, hydrALAZINE  Current Labs: reviewed    Physical Exam:  Blood pressure (!) 143/84, pulse 89, temperature 98 F (36.7 C), temperature source Oral, resp. rate 11, height 5\' 10"  (1.778 m), weight 107.5 kg, SpO2 99 %. NAD walking aorund in room CTAB RRR S/nt/nd No sig LEE  A Hyperkalemia 2/2 low GFR + NSAIDS + ARB CKD3b A2, prev saw Coladonato at [place], lost to f/u 2018 HTN, BPs ok on amlodipine DM2 HLD Hx/o CVA Hx/o SAH  P Cont to hold ARB and all NSAIDs do not restart Lokelma if K > 6 RD Consult for diet review for high K sources If K < 5.5 tomorrow ok for DC Will offer to have her return to [employer] MD 09/17/2021, 9:03 AM  Recent Labs  Lab 09/16/21 0454 09/16/21 1414 09/17/21 0611  NA 135  136 139 138  K 5.8*  5.9* 5.5* 6.1*  CL 106  105 108 109  CO2 22  21* 20* 21*  GLUCOSE 87  87 119* 90  BUN 34*  31* 32* 38*  CREATININE 1.76*  1.84* 1.86* 1.83*  CALCIUM 9.9  10.2 10.1 10.1  PHOS 5.9*  --   --     Recent Labs  Lab 09/14/21 1214 09/15/21 1054 09/15/21 1507 09/15/21 1839  WBC 7.8 8.4  --   --   NEUTROABS 3,455 4.5  --   --   HGB 8.5* 8.0* 8.8* 8.8*  HCT 26.8* 26.4* 26.0* 26.0*  MCV 84.0 88.6  --   --   PLT 351 343  --   --

## 2021-09-17 NOTE — Progress Notes (Signed)
RD Consult Diet Education  Renal, low potassium diet education was provided 8/25. Education handouts have been attached to discharge instructions. No further needs at this time. Please re-consult if additional nutrition concerns arise.   Lucas Mallow RD, LDN, CNSC Please refer to Amion for contact information.

## 2021-09-17 NOTE — Plan of Care (Signed)

## 2021-09-17 NOTE — Progress Notes (Signed)
PROGRESS NOTE    Yolanda Espinoza  QZE:[id number] DOB: 07/02/1955 DOA: 09/15/2021 PCP: Unk Pinto, MD    Chief Complaint  Patient presents with   Abnormal Labs    Brief Narrative:    Yolanda Espinoza  is a 66 y.o. female, with significant past medical history for hypertension, hyperlipidemia, chronic diastolic CHF, obstructive sleep apnea on CPAP, history of seizure disorders, history of brain aneurysm, subarachnoid hemorrhage, CVA, chronic kidney disease stage IIIb/IV, baseline creatinine 1.7-2, patient was sent by her PCP for hyperkalemia, blood work showing potassium of 6.1 last week, repeat today at PCP office showing a potassium of 7.1, so patient was directed to come to ED for further management, patient herself denies any complaints, reports she is at her usual state of health, compliant with her medications, she is not following any dietary restriction, no nausea, no vomiting, no diarrhea -In ED repeat labs confirmed potassium of 7, for which patient has received Lasix, D50 with IV insulin and calcium gluconate, repeat labs on i-STAT showing potassium of 4.9, patient was seen and evaluated by renal she had ordered [place], and [facility] hospitalist consulted to admit.  Assessment & Plan:   Principal Problem:   Hyperkalemia Active Problems:   Essential hypertension   Chronic diastolic heart failure (HCC)   OSA on CPAP   Vitamin D deficiency   Hyperlipidemia   Morbid obesity (HCC) - BMI 30+ with OSA   Gout   Gastroesophageal reflux disease   Anemia of chronic disease- renal      Hyperkalemia -This is most likely due to her chronic kidney disease, as well he does appear to be on Benicar as an outpatient -Management per renal, it was felt to be secondary to CKD, NSAIDs use and ARB, calcium remains elevated at 6.1, no significant events on telemetry, will give 1 dose of Lokelma today, will continue with chlorthalidone and recheck BMP in a.m. -Continue to hold ARB's, and all NSAIDs  and do not restart - started on chlorthalidone to augment kaliuresis, -As needed Lokelma for potassium more than 6 -Recheck BMP tomorrow  CKD stage IIIb/IV -Appears to be due to underlying hypertension, versus ischemic nephropathy -Continue with gentle hydration to allow for diuresis   Hypertension  - blood pressure acceptable, continue with home medication amlodipine, dose was decreased as she was started on chlorthalidone, her Benicar remains on hold   Anemia of chronic kidney disease, -At baseline   History of cerebral aneurysmS/p pipeline flow diverter across x2 Lt ICA  paraophthalmic aneurysms and CVA -He is following with IR Dr. Estanislado Pandy as an outpatient, she remains on aspirin and Brilinta which will be continued   History of gout -continue with allopurinol   GERD -Continue with PPI/famotidine   Hyperlipidemia -Continue with Crestor   History of seizures -Continue with Keppra   OSA - continue with CPAP   Chronic diastolic CHF -Euvolemic, monitor closely as on IV fluids(for possible need for further IV Lasix for hyperkalemia).      DVT prophylaxis: [place] heparin Code Status: Full Family Communication: none at bedside Disposition:   Status is: inpatient   Consultants:  renal   Subjective:  No significant events overnight, she denies any complaints today. Objective: Vitals:   09/16/21 1942 09/16/21 2324 09/17/21 0327 09/17/21 0800  BP: 123/75 137/87 127/77 (!) 143/84  Pulse: 89 75 86 89  Resp: 18 18 13 11   Temp: 98.2 F (36.8 C) 98.2 F (36.8 C) 98 F (36.7 C) 98 F (36.7 C)  TempSrc:  Oral Oral Oral Oral  SpO2: 95% 98% 98% 99%  Weight:      Height:       No intake or output data in the 24 hours ending 09/17/21 1105  Filed Weights   09/15/21 2104  Weight: 107.5 kg    Examination:  Awake Alert, Oriented X 3, No new F.N deficits, Normal affect Symmetrical Chest wall movement, Good air movement bilaterally, CTAB RRR,No Gallops,Rubs or new  Murmurs, No Parasternal Heave +ve B.Sounds, Abd Soft, No tenderness, No rebound - guarding or rigidity. No Cyanosis, Clubbing or edema, No new Rash or bruise        Data Reviewed: I have personally reviewed following labs and imaging studies  CBC: Recent Labs  Lab 09/14/21 1214 09/15/21 1054 09/15/21 1507 09/15/21 1839  WBC 7.8 8.4  --   --   NEUTROABS 3,455 4.5  --   --   HGB 8.5* 8.0* 8.8* 8.8*  HCT 26.8* 26.4* 26.0* 26.0*  MCV 84.0 88.6  --   --   PLT 351 343  --   --     Basic Metabolic Panel: Recent Labs  Lab 09/15/21 0957 09/15/21 1054 09/15/21 1507 09/15/21 1839 09/16/21 0454 09/16/21 1414 09/17/21 0611  NA 136 133* 137 137 135  136 139 138  K 5.4* 7.0* 4.9 5.4* 5.8*  5.9* 5.5* 6.1*  CL 114* 108 108 106 106  105 108 109  CO2 16* 19*  --   --  22  21* 20* 21*  GLUCOSE 102* 86 77 116* 87  87 119* 90  BUN 30* 33* 33* 32* 34*  31* 32* 38*  CREATININE 1.59* 2.05* 1.90* 2.00* 1.76*  1.84* 1.86* 1.83*  CALCIUM 7.9* 9.6  --   --  9.9  10.2 10.1 10.1  PHOS  --   --   --   --  5.9*  --   --     GFR: Estimated Creatinine Clearance: 40.7 mL/min (A) (by C-G formula based on SCr of 1.83 mg/dL (H)).  Liver Function Tests: Recent Labs  Lab 09/14/21 1214 09/15/21 1054 09/16/21 0454  AST 14 18  --   ALT 12 12  --   ALKPHOS  --  67  --   BILITOT 0.2 0.3  --   PROT 7.6 7.4  --   ALBUMIN  --  4.0 3.8    CBG: Recent Labs  Lab 09/15/21 1314 09/15/21 1455  GLUCAP 87 80     No results found for this or any previous visit (from the past 240 hour(s)).       Radiology Studies: No results found.      Scheduled Meds:  allopurinol  100 mg Oral Daily   amLODipine  5 mg Oral Daily   aspirin EC  81 mg Oral Daily   chlorthalidone  25 mg Oral Daily   cholecalciferol  1,000 Units Oral Daily   famotidine  20 mg Oral QHS   levETIRAcetam  1,000 mg Oral BID   pantoprazole  40 mg Oral Daily   rosuvastatin  5 mg Oral Q48H   ticagrelor  90 mg Oral BID    Continuous Infusions:   LOS: 1 day        Phillips Climes, MD [facility]   To contact the attending provider between 7A-7P or the covering provider during after hours 7P-7A, please log into the web site [email or web address] and access using universal [place] password for that web site. If you do not have  the password, please call the hospital operator.  09/17/2021, 11:05 AM

## 2021-09-18 DIAGNOSIS — G4733 Obstructive sleep apnea (adult) (pediatric): Secondary | ICD-10-CM | POA: Diagnosis not present

## 2021-09-18 DIAGNOSIS — E875 Hyperkalemia: Secondary | ICD-10-CM | POA: Diagnosis not present

## 2021-09-18 DIAGNOSIS — I5032 Chronic diastolic (congestive) heart failure: Secondary | ICD-10-CM | POA: Diagnosis not present

## 2021-09-18 DIAGNOSIS — D638 Anemia in other chronic diseases classified elsewhere: Secondary | ICD-10-CM | POA: Diagnosis not present

## 2021-09-18 LAB — BASIC METABOLIC PANEL
Anion gap: 11 (ref 5–15)
BUN: 39 mg/dL — ABNORMAL HIGH (ref 8–23)
CO2: 21 mmol/L — ABNORMAL LOW (ref 22–32)
Calcium: 10.1 mg/dL (ref 8.9–10.3)
Chloride: 107 mmol/L (ref 98–111)
Creatinine, Ser: 1.89 mg/dL — ABNORMAL HIGH (ref 0.44–1.00)
GFR, Estimated: 29 mL/min — ABNORMAL LOW (ref >60)
Glucose, Bld: 99 mg/dL (ref 70–99)
Potassium: 5.1 mmol/L (ref 3.5–5.1)
Sodium: 139 mmol/L (ref 135–145)

## 2021-09-18 MED ORDER — CHLORTHALIDONE 25 MG PO TABS: 25.0000 mg | ORAL_TABLET | Freq: Every day | ORAL | 1 refills | Status: AC

## 2021-09-18 MED ORDER — AMLODIPINE BESYLATE 5 MG PO TABS: 5.0000 mg | ORAL_TABLET | Freq: Every day | ORAL | 0 refills | Status: DC

## 2021-09-18 NOTE — Discharge Summary (Addendum)
Physician Discharge Summary  Yolanda Espinoza:[id number] DOB: 1955-08-06 DOA: 09/15/2021  PCP: Unk Pinto, MD  Admit date: 09/15/2021 Discharge date: 09/18/2021  Admitted From: Home Disposition:  Home  Recommendations for Outpatient Follow-up:  Follow up with PCP in 1-2 weeks Please obtain BMP/CBC in one week    Discharge Condition:Stable CODE STATUS:FULL Diet recommendation: Heart Healthy / low potassium diet   Brief/Interim Summary:  Yolanda Espinoza  is a 66 y.o. female, with significant past medical history for hypertension, hyperlipidemia, chronic diastolic CHF, obstructive sleep apnea on CPAP, history of seizure disorders, history of brain aneurysm, subarachnoid hemorrhage, CVA, chronic kidney disease stage IIIb/IV, baseline creatinine 1.7-2, patient was sent by her PCP for hyperkalemia, blood work showing potassium of 6.1 last week, repeat today at PCP office showing a potassium of 7.1, so patient was directed to come to ED for further management, patient herself denies any complaints, reports she is at her usual state of health, compliant with her medications, she is not following any dietary restriction, no nausea, no vomiting, no diarrhea -Apparently she still using NSAIDs, and did not stop her ARB's as instructed by her PCP, she was started on chlorthalidone with good results, please see discussion below.      Hyperkalemia -This is most likely due to her chronic kidney disease, as well she was still taking her home Benicar despite being asked by PCP not to take it, and she was using NSAIDs as well.  . -Sinew to hold ARB and all NSAIDs on discharge, and she was started on chlorthalidone 25 mg oral daily to help with kaliuresis, and she was given recommendation regarding low potassium diet.  We will follow-up with renal as an outpatient. -Lasix has been stopped on discharge as she is started on chlorthalidone    CKD stage IIIb/IV -Appears to be due to underlying hypertension,  versus ischemic nephropathy -Proved with gentle hydration   Hypertension  - blood pressure acceptable, continue with home medication amlodipine, dose was decreased as she was started on chlorthalidone, her Benicar remains on hold   Anemia of chronic kidney disease, -At baseline   History of cerebral aneurysmS/p pipeline flow diverter across x2 Lt ICA  paraophthalmic aneurysms and CVA -He is following with IR Dr. Estanislado Pandy as an outpatient, she remains on aspirin and Brilinta which will be continued   History of gout -continue with allopurinol   GERD -Continue with PPI/famotidine   Hyperlipidemia -Continue with Crestor   History of seizures -Continue with Keppra   OSA - continue with CPAP   Chronic diastolic CHF -Euvolemic      Discharge Diagnoses:  Principal Problem:   Hyperkalemia Active Problems:   Essential hypertension   Chronic diastolic heart failure (HCC)   OSA on CPAP   Vitamin D deficiency   Hyperlipidemia   Morbid obesity (HCC) - BMI 30+ with OSA   Gout   Gastroesophageal reflux disease   Anemia of chronic disease- renal    Discharge Instructions  Discharge Instructions     Diet - low sodium heart healthy   Complete by: As directed    Discharge instructions   Complete by: As directed    Follow with Primary MD Unk Pinto, MD in 7 days   Get CBC, CMP, checked  by Primary MD next visit.    Activity: As tolerated with Full fall precautions use walker/cane & assistance as needed   Disposition Home    Diet: Low potassium diet, please see diet instructions on discharge direction  On your next visit with your primary care physician please Get Medicines reviewed and adjusted.   Please request your Prim.MD to go over all Hospital Tests and Procedure/Radiological results at the follow up, please get all Hospital records sent to your Prim MD by signing hospital release before you go home.   If you experience worsening of your  admission symptoms, develop shortness of breath, life threatening emergency, suicidal or homicidal thoughts you must seek medical attention immediately by calling 911 or calling your MD immediately  if symptoms less severe.  You Must read complete instructions/literature along with all the possible adverse reactions/side effects for all the Medicines you take and that have been prescribed to you. Take any new Medicines after you have completely understood and accpet all the possible adverse reactions/side effects.   Do not drive, operating heavy machinery, perform activities at heights, swimming or participation in water activities or provide baby sitting services if your were admitted for syncope or siezures until you have seen by Primary MD or a Neurologist and advised to do so again.  Do not drive when taking Pain medications.    Do not take more than prescribed Pain, Sleep and Anxiety Medications  Special Instructions: If you have smoked or chewed Tobacco  in the last 2 yrs please stop smoking, stop any regular Alcohol  and or any Recreational drug use.  Wear Seat belts while driving.   Please note  You were cared for by a hospitalist during your hospital stay. If you have any questions about your discharge medications or the care you received while you were in the hospital after you are discharged, you can call the unit and asked to speak with the hospitalist on call if the hospitalist that took care of you is not available. Once you are discharged, your primary care physician will handle any further medical issues. Please note that NO REFILLS for any discharge medications will be authorized once you are discharged, as it is imperative that you return to your primary care physician (or establish a relationship with a primary care physician if you do not have one) for your aftercare needs so that they can reassess your need for medications and monitor your lab values.   Increase activity  slowly   Complete by: As directed       Allergies as of 09/18/2021       Reactions   Promethazine-dm    Palpitations   Ace Inhibitors Nausea And Vomiting   Aspirin Nausea And Vomiting   Can take coated Aspirin        Medication List     STOP taking these medications    diclofenac 75 MG EC tablet Commonly known as: VOLTAREN   furosemide 40 MG tablet Commonly known as: LASIX   olmesartan 40 MG tablet Commonly known as: BENICAR       TAKE these medications    allopurinol 100 MG tablet Commonly known as: ZYLOPRIM Take 1 tablet  Daily to Prevent Gout   amLODipine 5 MG tablet Commonly known as: NORVASC Take 1 tablet (5 mg total) by mouth daily. Start taking on: September 19, 2021 What changed:  medication strength how much to take how to take this when to take this additional instructions   aspirin EC 81 MG tablet Take 1 tablet (81 mg total) by mouth daily.   chlorthalidone 25 MG tablet Commonly known as: HYGROTON Take 1 tablet (25 mg total) by mouth daily. Start taking on: September 19, 2021  cholecalciferol 25 MCG (1000 UNIT) tablet Commonly known as: VITAMIN D3 Take 1,000 Units by mouth daily.   cyclobenzaprine 10 MG tablet Commonly known as: FLEXERIL Take 1 tablet at Bedtime as Needed for Muscle Spasms                                                /                   TAKE                              BY                           MOUTH   famotidine 20 MG tablet Commonly known as: PEPCID TAKE ONE TABLET BY MOUTH DAILY AT 9 AM EVERY MORNING, 30-60 MINUTES PRIOR TO BREAKFAST FOR REFLUX. What changed: See the new instructions.   fexofenadine 180 MG tablet Commonly known as: Allegra Allergy Take 1 tablet (180 mg total) by mouth daily.   levETIRAcetam 500 MG tablet Commonly known as: KEPPRA Take  1 tablet   2 x /day (every 12 hours)  to Prevent Seizures.   levETIRAcetam 1000 MG tablet Commonly known as: KEPPRA Take 1,000 mg by mouth 2 (two) times  daily.   multivitamin with minerals tablet Take 1 tablet by mouth daily.   pantoprazole 40 MG tablet Commonly known as: PROTONIX Take one tablet at bedtime   rosuvastatin 5 MG tablet Commonly known as: Crestor Take 1 tab three days a week (MWF) for cholesterol.   ticagrelor 90 MG Tabs tablet Commonly known as: Brilinta Take 1 tablet  2 x /day (every 12 hours )  to prevent Blood Clots        Follow-up Information     Donato Heinz, MD Follow up in 2 week(s).   Specialty: Nephrology Why: We will call with appointment details Contact information: [address] [place] [address] [phone]                Allergies  Allergen Reactions   Promethazine-Dm     Palpitations   Ace Inhibitors Nausea And Vomiting   Aspirin Nausea And Vomiting    Can take coated Aspirin    Consultations: Renal   Procedures/Studies: No results found.    Subjective:  She denies any complaints today Discharge Exam: Vitals:   09/17/21 2305 09/18/21 0317  BP: 124/86 (!) 143/86  Pulse: 83 79  Resp: 18 18  Temp: 97.8 F (36.6 C) 97.8 F (36.6 C)  SpO2: 99% 98%   Vitals:   09/17/21 1620 09/17/21 1939 09/17/21 2305 09/18/21 0317  BP: (!) 134/96 (!) 141/79 124/86 (!) 143/86  Pulse: 84 85 83 79  Resp: 18 18 18 18   Temp: 98.6 F (37 C) 97.9 F (36.6 C) 97.8 F (36.6 C) 97.8 F (36.6 C)  TempSrc: Oral Oral Oral Oral  SpO2: 99% 98% 99% 98%  Weight:      Height:        General: Pt is alert, awake, not in acute distress Cardiovascular: RRR, S1/S2 +, no rubs, no gallops Respiratory: CTA bilaterally, no wheezing, no rhonchi Abdominal: Soft, NT, ND, bowel sounds + Extremities: no edema, no cyanosis    The results  of significant diagnostics from this hospitalization (including imaging, microbiology, ancillary and laboratory) are listed below for reference.     Microbiology: No results found for this or any previous visit (from the past 240 hour(s)).    Labs: BNP (last 3 results) No results for input(s): "BNP" in the last 8760 hours. Basic Metabolic Panel: Recent Labs  Lab 09/15/21 1054 09/15/21 1507 09/15/21 1839 09/16/21 0454 09/16/21 1414 09/17/21 0611 09/18/21 0257  NA 133*   < > 137 135  136 139 138 139  K 7.0*   < > 5.4* 5.8*  5.9* 5.5* 6.1* 5.1  CL 108   < > 106 106  105 108 109 107  CO2 19*  --   --  22  21* 20* 21* 21*  GLUCOSE 86   < > 116* 87  87 119* 90 99  BUN 33*   < > 32* 34*  31* 32* 38* 39*  CREATININE 2.05*   < > 2.00* 1.76*  1.84* 1.86* 1.83* 1.89*  CALCIUM 9.6  --   --  9.9  10.2 10.1 10.1 10.1  PHOS  --   --   --  5.9*  --   --   --    < > = values in this interval not displayed.   Liver Function Tests: Recent Labs  Lab 09/14/21 1214 09/15/21 1054 09/16/21 0454  AST 14 18  --   ALT 12 12  --   ALKPHOS  --  67  --   BILITOT 0.2 0.3  --   PROT 7.6 7.4  --   ALBUMIN  --  4.0 3.8   No results for input(s): "LIPASE", "AMYLASE" in the last 168 hours. No results for input(s): "AMMONIA" in the last 168 hours. CBC: Recent Labs  Lab 09/14/21 1214 09/15/21 1054 09/15/21 1507 09/15/21 1839  WBC 7.8 8.4  --   --   NEUTROABS 3,455 4.5  --   --   HGB 8.5* 8.0* 8.8* 8.8*  HCT 26.8* 26.4* 26.0* 26.0*  MCV 84.0 88.6  --   --   PLT 351 343  --   --    Cardiac Enzymes: Recent Labs  Lab 09/15/21 1522  CKTOTAL 56   BNP: Invalid input(s): "POCBNP" CBG: Recent Labs  Lab 09/15/21 1314 09/15/21 1455  GLUCAP 87 80   D-Dimer No results for input(s): "DDIMER" in the last 72 hours. Hgb A1c No results for input(s): "HGBA1C" in the last 72 hours. Lipid Profile No results for input(s): "CHOL", "HDL", "LDLCALC", "TRIG", "CHOLHDL", "LDLDIRECT" in the last 72 hours. Thyroid function studies No results for input(s): "TSH", "T4TOTAL", "T3FREE", "THYROIDAB" in the last 72 hours.  Invalid input(s): "FREET3" Anemia work up Recent Labs    09/15/21 1522  FERRITIN 10*  TIBC 433  IRON 59    Urinalysis    Component Value Date/Time   COLORURINE YELLOW 01/26/2021 [place] 01/26/2021 1124   LABSPEC 1.009 01/26/2021 1124   PHURINE 6.0 01/26/2021 1124   GLUCOSEU NEGATIVE 01/26/2021 1124   [place] 01/26/2021 1124   [place] 10/26/2015 0925   KETONESUR NEGATIVE 01/26/2021 1124   PROTEINUR 1+ (A) 01/26/2021 1124   UROBILINOGEN 0.2 10/01/2013 0831   NITRITE NEGATIVE 01/26/2021 1124   LEUKOCYTESUR 2+ (A) 01/26/2021 1124   Sepsis Labs Recent Labs  Lab 09/14/21 1214 09/15/21 1054  WBC 7.8 8.4   Microbiology No results found for this or any previous visit (from the past 240 hour(s)).  Time coordinating discharge: Over 30 minutes  SIGNED:   Phillips Climes, MD  [facility] 09/18/2021, 10:30 AM Pager   If 7PM-7AM, please contact night-coverage [email or web address]

## 2021-09-18 NOTE — Progress Notes (Signed)
Admit: 09/15/2021 LOS: 2  78F CKD3b with hyperkalemia  Subjective:  K 5.1 this AM GFR stable BPs ok  No intake/output data recorded.  Filed Weights   09/15/21 2104  Weight: 107.5 kg    Scheduled Meds:  allopurinol  100 mg Oral Daily   amLODipine  5 mg Oral Daily   aspirin EC  81 mg Oral Daily   chlorthalidone  25 mg Oral Daily   cholecalciferol  1,000 Units Oral Daily   famotidine  20 mg Oral QHS   heparin injection (subcutaneous)  5,000 Units Subcutaneous Q8H   levETIRAcetam  1,000 mg Oral BID   pantoprazole  40 mg Oral Daily   rosuvastatin  5 mg Oral Q48H   ticagrelor  90 mg Oral BID   Continuous Infusions: PRN Meds:.acetaminophen **OR** acetaminophen, hydrALAZINE  Current Labs: reviewed    Physical Exam:  Blood pressure (!) 143/86, pulse 79, temperature 97.8 F (36.6 C), temperature source Oral, resp. rate 18, height 5\' 10"  (1.778 m), weight 107.5 kg, SpO2 98 %. NAD walking aorund in room CTAB RRR S/nt/nd No sig LEE  A Improved Hyperkalemia 2/2 low GFR + NSAIDS + ARB CKD3b A2, prev saw Coladonato at [place], lost to f/u 2018 HTN, BPs ok on amlodipine DM2 HLD Hx/o CVA Hx/o SAH  P Cont to hold ARB and all NSAIDs do not restart Cont chlorthalidone RD Consult for diet review for high K sources completed OK For DC I will arrange f/u at [place] with labs  Pearson Grippe MD 09/18/2021, 9:35 AM  Recent Labs  Lab 09/16/21 0454 09/16/21 1414 09/17/21 0611 09/18/21 0257  NA 135  136 139 138 139  K 5.8*  5.9* 5.5* 6.1* 5.1  CL 106  105 108 109 107  CO2 22  21* 20* 21* 21*  GLUCOSE 87  87 119* 90 99  BUN 34*  31* 32* 38* 39*  CREATININE 1.76*  1.84* 1.86* 1.83* 1.89*  CALCIUM 9.9  10.2 10.1 10.1 10.1  PHOS 5.9*  --   --   --     Recent Labs  Lab 09/14/21 1214 09/15/21 1054 09/15/21 1507 09/15/21 1839  WBC 7.8 8.4  --   --   NEUTROABS 3,455 4.5  --   --   HGB 8.5* 8.0* 8.8* 8.8*  HCT 26.8* 26.4* 26.0* 26.0*  MCV 84.0 88.6  --   --   PLT 351  343  --   --

## 2021-09-21 ENCOUNTER — Other Ambulatory Visit: Payer: Self-pay | Admitting: Nurse Practitioner

## 2021-09-21 LAB — CBC WITH DIFFERENTIAL/PLATELET
Absolute Monocytes: 499 cells/uL (ref 200–950)
Basophils Absolute: 62 cells/uL (ref 0–200)
Basophils Relative: 0.8 %
Eosinophils Absolute: 491 cells/uL (ref 15–500)
Eosinophils Relative: 6.3 %
HCT: 26.8 % — ABNORMAL LOW (ref 35.0–45.0)
Hemoglobin: 8.5 g/dL — ABNORMAL LOW (ref 11.7–15.5)
Lymphs Abs: 3292 cells/uL (ref 850–3900)
MCH: 26.6 pg — ABNORMAL LOW (ref 27.0–33.0)
MCHC: 31.7 g/dL — ABNORMAL LOW (ref 32.0–36.0)
MCV: 84 fL (ref 80.0–100.0)
MPV: 10 fL (ref 7.5–12.5)
Monocytes Relative: 6.4 %
Neutro Abs: 3455 cells/uL (ref 1500–7800)
Neutrophils Relative %: 44.3 %
Platelets: 351 10*3/uL (ref 140–400)
RBC: 3.19 10*6/uL — ABNORMAL LOW (ref 3.80–5.10)
RDW: 16.7 % — ABNORMAL HIGH (ref 11.0–15.0)
Total Lymphocyte: 42.2 %
WBC: 7.8 10*3/uL (ref 3.8–10.8)

## 2021-09-21 LAB — COMPLETE METABOLIC PANEL WITH GFR
AG Ratio: 1.3 (calc) (ref 1.0–2.5)
ALT: 12 U/L (ref 6–29)
AST: 14 U/L (ref 10–35)
Albumin: 4.3 g/dL (ref 3.6–5.1)
Alkaline phosphatase (APISO): 79 U/L (ref 37–153)
BUN/Creatinine Ratio: 18 (calc) (ref 6–22)
BUN: 36 mg/dL — ABNORMAL HIGH (ref 7–25)
CO2: 24 mmol/L (ref 20–32)
Calcium: 10.3 mg/dL (ref 8.6–10.4)
Chloride: 109 mmol/L (ref 98–110)
Creat: 1.95 mg/dL — ABNORMAL HIGH (ref 0.50–1.05)
Globulin: 3.3 g/dL (calc) (ref 1.9–3.7)
Glucose, Bld: 88 mg/dL (ref 65–99)
Potassium: 7.1 mmol/L (ref 3.5–5.3)
Sodium: 138 mmol/L (ref 135–146)
Total Bilirubin: 0.2 mg/dL (ref 0.2–1.2)
Total Protein: 7.6 g/dL (ref 6.1–8.1)
eGFR: 28 mL/min/{1.73_m2} — ABNORMAL LOW (ref >=60)

## 2021-09-21 LAB — IRON,TIBC AND FERRITIN PANEL
%SAT: 14 % (calc) — ABNORMAL LOW (ref 16–45)
Ferritin: 10 ng/mL — ABNORMAL LOW (ref 16–288)
Iron: 56 ug/dL (ref 45–160)
TIBC: 413 mcg/dL (calc) (ref 250–450)

## 2021-09-21 LAB — LEVETIRACETAM LEVEL: Keppra (Levetiracetam): 72.8 ug/mL — ABNORMAL HIGH

## 2021-09-22 ENCOUNTER — Other Ambulatory Visit: Payer: Self-pay

## 2021-09-22 DIAGNOSIS — M109 Gout, unspecified: Secondary | ICD-10-CM

## 2021-09-22 MED ORDER — ALLOPURINOL 100 MG PO TABS: ORAL_TABLET | ORAL | 3 refills | Status: DC

## 2021-09-22 MED ORDER — PANTOPRAZOLE SODIUM 40 MG PO TBEC: DELAYED_RELEASE_TABLET | ORAL | 3 refills | Status: DC

## 2021-09-27 ENCOUNTER — Encounter: Payer: Self-pay | Admitting: Nurse Practitioner

## 2021-09-27 ENCOUNTER — Ambulatory Visit (INDEPENDENT_AMBULATORY_CARE_PROVIDER_SITE_OTHER): Payer: Medicare PPO | Admitting: Nurse Practitioner

## 2021-09-27 VITALS — BP 132/76 | HR 76 | Temp 97.5°F | Ht 70.0 in | Wt 229.0 lb

## 2021-09-27 DIAGNOSIS — G40909 Epilepsy, unspecified, not intractable, without status epilepticus: Secondary | ICD-10-CM

## 2021-09-27 DIAGNOSIS — N1832 Chronic kidney disease, stage 3b: Secondary | ICD-10-CM

## 2021-09-27 DIAGNOSIS — I10 Essential (primary) hypertension: Secondary | ICD-10-CM

## 2021-09-27 DIAGNOSIS — Z79899 Other long term (current) drug therapy: Secondary | ICD-10-CM | POA: Diagnosis not present

## 2021-09-27 DIAGNOSIS — E875 Hyperkalemia: Secondary | ICD-10-CM | POA: Diagnosis not present

## 2021-09-27 NOTE — Progress Notes (Signed)
Hospital follow up  Assessment and Plan: Hospital visit follow up for:   1. Hyperkalemia Monitor intake of high potassium foods such as bananas, avocado, sweet potatoes, spinach, potatoes, oranges.  Discussed utilizihng Kayexelate for tmt of hyperkalemia in the future.  - CBC with Differential/Platelet - COMPLETE METABOLIC PANEL WITH GFR  2. Medication management All medications discussed and reviewed in full. All questions and concerns regarding medications addressed.    - Levetiracetam level  3. Essential hypertension Discussed DASH (Dietary Approaches to Stop Hypertension) DASH diet is lower in sodium than a typical American diet. Cut back on foods that are high in saturated fat, cholesterol, and trans fats. Eat more whole-grain foods, fish, poultry, and nuts Remain active and exercise as tolerated daily.  Monitor BP at home-Call if greater than 130/80.   - CBC with Differential/Platelet - COMPLETE METABOLIC PANEL WITH GFR  4. Seizure disorder (HCC) Continue Keppra. Discussed dosage change if levels continue to remain elevated.  - Levetiracetam level  5. CKD stage G3b/A2, GFR 30-44 and albumin creatinine ratio 30-299 mg/g ([place]) Continue referral to Nephrology 10/28/21 Discussed how what you eat and drink can aide in kidney protection. Stay well hydrated. Avoid high salt foods. Avoid NSAIDS. Keep BP and BG well controlled.   Take medications as prescribed. Remain active and exercise as tolerated daily. Maintain weight.  Continue to monitor.  - CBC with Differential/Platelet - COMPLETE METABOLIC PANEL WITH GFR    All medications were reviewed with patient and family and fully reconciled. All questions answered fully, and patient and family members were encouraged to call the office with any further questions or concerns. Discussed goal to avoid readmission related to this diagnosis.   Olmesartan was discontinued.   Over 40 minutes of exam, counseling, chart  review, and complex, high/moderate level critical decision making was performed this visit.   Future Appointments  Date Time Provider [place]  10/03/2021 11:30 AM [facility] NURSE [facility] None  11/Yolanda/2023  9:30 AM Darrol Jump, NP [facility] None  01/26/2022 10:00 AM Darrol Jump, NP [facility] None  05/29/2022 10:00 AM Darrol Jump, NP [facility] None     HPI 66 Espinoza presents for follow up for transition from recent hospitalization stay. Admit date to the hospital was 09/15/21, patient was discharged from the hospital on 09/18/21 and our clinical staff contacted the office the day after discharge to set up a follow up appointment. The discharge summary, medications, and diagnostic test results were reviewed before meeting with the patient. The patient was admitted for:   Hyperkalemia. She was not following any dietary restriction.  She is still using NSAIDs and did not stop her ARB.  She has not followed with Nephrology. During her hospital stay she was started on Chlorthalidone.    Hyperkalemia most likely d/t CKD and home Benicar, as well as NSAID use.  She reports that she is eating one meal a day and checking for potassium rich foods  She reports taking Keppra 1000 BID  She has an apt with Neph on 10/28/21.  Home health is not involved.   Images while in the hospital: No results found.    Current Outpatient Medications (Cardiovascular):    amLODipine (NORVASC) 5 MG tablet, Take 1 tablet (5 mg total) by mouth daily.   chlorthalidone (HYGROTON) 25 MG tablet, Take 1 tablet (25 mg total) by mouth daily.   rosuvastatin (CRESTOR) 5 MG tablet, Take 1 tab three days a week (MWF) for cholesterol.  Current Outpatient Medications (Respiratory):  fexofenadine (ALLEGRA ALLERGY) 180 MG tablet, Take 1 tablet (180 mg total) by mouth daily.  Current Outpatient Medications (Analgesics):    allopurinol (ZYLOPRIM) 100 MG tablet, Take 1 tablet  Daily to Prevent Gout    aspirin EC 81 MG tablet, Take 1 tablet (81 mg total) by mouth daily.  Current Outpatient Medications (Hematological):    ticagrelor (BRILINTA) 90 MG TABS tablet, Take 1 tablet  2 x /day (every 12 hours )  to prevent Blood Clots  Current Outpatient Medications (Other):    cholecalciferol (VITAMIN D3) 25 MCG (1000 UT) tablet, Take 1,000 Units by mouth daily.   cyclobenzaprine (FLEXERIL) 10 MG tablet, Take 1 tablet at Bedtime as Needed for Muscle Spasms                                                /                   TAKE                              BY                           MOUTH   famotidine (PEPCID) 20 MG tablet, TAKE ONE TABLET BY MOUTH DAILY AT 9 AM EVERY MORNING, 30-60 MINUTES PRIOR TO BREAKFAST FOR REFLUX. (Patient taking differently: Take 20 mg by mouth at bedtime. TAKE ONE TABLET BY MOUTH DAILY AT 9 AM EVERY MORNING, 30-60 MINUTES PRIOR TO BREAKFAST FOR REFLUX.)   levETIRAcetam (KEPPRA) 1000 MG tablet, Take 1,000 mg by mouth 2 (two) times daily.   Multiple Vitamins-Minerals (MULTIVITAMIN WITH MINERALS) tablet, Take 1 tablet by mouth daily.   pantoprazole (PROTONIX) 40 MG tablet, Take one tablet at bedtime   levETIRAcetam (KEPPRA) 500 MG tablet, Take  1 tablet   2 x /day (every 12 hours)  to Prevent Seizures. (Patient not taking: Reported on 09/15/2021)  Past Medical History:  Diagnosis Date   Anemia    Arthritis    Chronic diastolic heart failure (HCC)    GERD (gastroesophageal reflux disease)    Heart murmur    Hyperlipidemia    Hypertension may, 1973   Mitral regurgitation    a. Echo 01/2012 mild LVH, EF 55-65%, Gr 2 DD, mod MR, mild LAE, PASP 36;  b. Echo (02/2013):  EF 55-60%, Gr 1 DD, mild to mod MR, mild LAE (no sig change since prior echo) // c. Echo 10/18: EF 55-60, normal wall motion, grade 2 diastolic dysfunction, trivial AI, mild to moderate MR, mild LAE, trivial PI, PASP 38     OSA on CPAP    7 yrs   Seizures ([place]) 01/2017   Stroke ([place]) 07   no weakness jan 29th  2019 no defecits   Subarachnoid hemorrhage due to ruptured aneurysm ([place])    2003 - s/p coiling   Unspecified vitamin D deficiency      Allergies  Allergen Reactions   Promethazine-Dm     Palpitations   Ace Inhibitors Nausea And Vomiting   Aspirin Nausea And Vomiting    Can take coated Aspirin    ROS: all negative except above.   Physical Exam: Filed Weights   09/27/21 1501  Weight: 229 lb (103.9 kg)  BP 132/76   Pulse 76   Temp (!) 97.5 F (36.4 C)   Ht 5\' 10"  (1.778 m)   Wt 229 lb (103.9 kg)   SpO2 99%   BMI 32.86 kg/m  General Appearance: Well nourished, in no apparent distress. Eyes: PERRLA, EOMs, conjunctiva no swelling or erythema Sinuses: No Frontal/maxillary tenderness ENT/Mouth: Ext aud canals clear, TMs without erythema, bulging. No erythema, swelling, or exudate on post pharynx.  Tonsils not swollen or erythematous. Hearing normal.  Neck: Supple, thyroid normal.  Respiratory: Respiratory effort normal, BS equal bilaterally without rales, rhonchi, wheezing or stridor.  Cardio: RRR with no MRGs. Brisk peripheral pulses without edema.  Abdomen: Soft, + BS.  Non tender, no guarding, rebound, hernias, masses. Lymphatics: Non tender without lymphadenopathy.  Musculoskeletal: Full ROM, 5/5 strength, normal gait.  Skin: Warm, dry without rashes, lesions, ecchymosis.  Neuro: Cranial nerves intact. Normal muscle tone, no cerebellar symptoms. Sensation intact.  Psych: Awake and oriented X 3, normal affect, Insight and Judgment appropriate.     Darrol Jump, NP 9:42 PM [facility]

## 2021-09-28 ENCOUNTER — Other Ambulatory Visit: Payer: Self-pay | Admitting: Nurse Practitioner

## 2021-09-28 DIAGNOSIS — D638 Anemia in other chronic diseases classified elsewhere: Secondary | ICD-10-CM

## 2021-09-28 DIAGNOSIS — N184 Chronic kidney disease, stage 4 (severe): Secondary | ICD-10-CM

## 2021-09-28 DIAGNOSIS — E871 Hypo-osmolality and hyponatremia: Secondary | ICD-10-CM

## 2021-10-03 ENCOUNTER — Ambulatory Visit (INDEPENDENT_AMBULATORY_CARE_PROVIDER_SITE_OTHER): Payer: Medicare PPO

## 2021-10-03 DIAGNOSIS — R5383 Other fatigue: Secondary | ICD-10-CM

## 2021-10-03 DIAGNOSIS — D638 Anemia in other chronic diseases classified elsewhere: Secondary | ICD-10-CM | POA: Diagnosis not present

## 2021-10-03 DIAGNOSIS — D649 Anemia, unspecified: Secondary | ICD-10-CM

## 2021-10-03 DIAGNOSIS — N184 Chronic kidney disease, stage 4 (severe): Secondary | ICD-10-CM

## 2021-10-03 DIAGNOSIS — E871 Hypo-osmolality and hyponatremia: Secondary | ICD-10-CM

## 2021-10-03 NOTE — Progress Notes (Signed)
Patient presents to the office for a nurse visit to have labs done. No questions at this time. Blood pressure (!) 148/84, pulse 91, temperature (!) 97.3 F (36.3 C), height 5\' 10"  (1.778 m), weight 229 lb (103.9 kg), SpO2 99 %.

## 2021-10-04 ENCOUNTER — Other Ambulatory Visit: Payer: Self-pay | Admitting: Nurse Practitioner

## 2021-10-04 LAB — CBC WITH DIFFERENTIAL/PLATELET
Absolute Monocytes: 534 cells/uL (ref 200–950)
Absolute Monocytes: 580 cells/uL (ref 200–950)
Basophils Absolute: 46 cells/uL (ref 0–200)
Basophils Absolute: 64 cells/uL (ref 0–200)
Basophils Relative: 0.5 %
Basophils Relative: 0.7 %
Eosinophils Absolute: 267 cells/uL (ref 15–500)
Eosinophils Absolute: 331 cells/uL (ref 15–500)
Eosinophils Relative: 2.9 %
Eosinophils Relative: 3.6 %
HCT: 24.6 % — ABNORMAL LOW (ref 35.0–45.0)
HCT: 26.4 % — ABNORMAL LOW (ref 35.0–45.0)
Hemoglobin: 7.9 g/dL — ABNORMAL LOW (ref 11.7–15.5)
Hemoglobin: 8.5 g/dL — ABNORMAL LOW (ref 11.7–15.5)
Lymphs Abs: 3349 cells/uL (ref 850–3900)
Lymphs Abs: 3110 cells/uL (ref 850–3900)
MCH: 26.4 pg — ABNORMAL LOW (ref 27.0–33.0)
MCH: 27 pg (ref 27.0–33.0)
MCHC: 32.1 g/dL (ref 32.0–36.0)
MCHC: 32.2 g/dL (ref 32.0–36.0)
MCV: 82.3 fL (ref 80.0–100.0)
MCV: 83.8 fL (ref 80.0–100.0)
MPV: 9.8 fL (ref 7.5–12.5)
MPV: 10.3 fL (ref 7.5–12.5)
Monocytes Relative: 5.8 %
Monocytes Relative: 6.3 %
Neutro Abs: 5005 cells/uL (ref 1500–7800)
Neutro Abs: 5115 cells/uL (ref 1500–7800)
Neutrophils Relative %: 54.4 %
Neutrophils Relative %: 55.6 %
Platelets: 326 10*3/uL (ref 140–400)
Platelets: 358 10*3/uL (ref 140–400)
RBC: 2.99 10*6/uL — ABNORMAL LOW (ref 3.80–5.10)
RBC: 3.15 10*6/uL — ABNORMAL LOW (ref 3.80–5.10)
RDW: 16.5 % — ABNORMAL HIGH (ref 11.0–15.0)
RDW: 16.4 % — ABNORMAL HIGH (ref 11.0–15.0)
Total Lymphocyte: 36.4 %
Total Lymphocyte: 33.8 %
WBC: 9.2 10*3/uL (ref 3.8–10.8)
WBC: 9.2 10*3/uL (ref 3.8–10.8)

## 2021-10-04 LAB — COMPLETE METABOLIC PANEL WITH GFR
AG Ratio: 1.4 (calc) (ref 1.0–2.5)
AG Ratio: 1.4 (calc) (ref 1.0–2.5)
ALT: 8 U/L (ref 6–29)
ALT: 9 U/L (ref 6–29)
AST: 12 U/L (ref 10–35)
AST: 15 U/L (ref 10–35)
Albumin: 4.5 g/dL (ref 3.6–5.1)
Albumin: 4.7 g/dL (ref 3.6–5.1)
Alkaline phosphatase (APISO): 67 U/L (ref 37–153)
Alkaline phosphatase (APISO): 71 U/L (ref 37–153)
BUN/Creatinine Ratio: 20 (calc) (ref 6–22)
BUN/Creatinine Ratio: 25 (calc) — ABNORMAL HIGH (ref 6–22)
BUN: 46 mg/dL — ABNORMAL HIGH (ref 7–25)
BUN: 51 mg/dL — ABNORMAL HIGH (ref 7–25)
CO2: 22 mmol/L (ref 20–32)
CO2: 22 mmol/L (ref 20–32)
Calcium: 10 mg/dL (ref 8.6–10.4)
Calcium: 10.1 mg/dL (ref 8.6–10.4)
Chloride: 100 mmol/L (ref 98–110)
Chloride: 100 mmol/L (ref 98–110)
Creat: 2.32 mg/dL — ABNORMAL HIGH (ref 0.50–1.05)
Creat: 2.06 mg/dL — ABNORMAL HIGH (ref 0.50–1.05)
Globulin: 3.2 g/dL (calc) (ref 1.9–3.7)
Globulin: 3.3 g/dL (calc) (ref 1.9–3.7)
Glucose, Bld: 85 mg/dL (ref 65–99)
Glucose, Bld: 93 mg/dL (ref 65–99)
Potassium: 4.7 mmol/L (ref 3.5–5.3)
Potassium: 4.3 mmol/L (ref 3.5–5.3)
Sodium: 134 mmol/L — ABNORMAL LOW (ref 135–146)
Sodium: 133 mmol/L — ABNORMAL LOW (ref 135–146)
Total Bilirubin: 0.2 mg/dL (ref 0.2–1.2)
Total Bilirubin: 0.2 mg/dL (ref 0.2–1.2)
Total Protein: 7.7 g/dL (ref 6.1–8.1)
Total Protein: 8 g/dL (ref 6.1–8.1)
eGFR: 23 mL/min/{1.73_m2} — ABNORMAL LOW (ref >=60)
eGFR: 26 mL/min/{1.73_m2} — ABNORMAL LOW (ref >=60)

## 2021-10-04 LAB — LEVETIRACETAM LEVEL: Keppra (Levetiracetam): 62.8 ug/mL — ABNORMAL HIGH

## 2021-10-04 MED ORDER — FERROUS SULFATE 325 (65 FE) MG PO TABS
325.0000 mg | ORAL_TABLET | Freq: Every day | ORAL | 2 refills | Status: DC
Start: 2021-10-04 — End: 2022-01-12

## 2021-10-05 ENCOUNTER — Other Ambulatory Visit: Payer: Self-pay

## 2021-10-05 MED ORDER — LEVETIRACETAM 1000 MG PO TABS: ORAL_TABLET | ORAL | 0 refills | Status: AC

## 2021-10-06 ENCOUNTER — Telehealth: Payer: Self-pay | Admitting: Nurse Practitioner

## 2021-10-06 NOTE — Telephone Encounter (Signed)
Pt is complaining of pain in legs, wanting to know if she is ok to take Tylenol with the current restriction son her med use

## 2021-10-07 NOTE — Telephone Encounter (Signed)
Voicemail not set up yet. Will send a mychart message to her

## 2021-10-17 DIAGNOSIS — N390 Urinary tract infection, site not specified: Secondary | ICD-10-CM | POA: Diagnosis not present

## 2021-10-17 DIAGNOSIS — N1831 Chronic kidney disease, stage 3a: Secondary | ICD-10-CM | POA: Diagnosis not present

## 2021-10-19 ENCOUNTER — Other Ambulatory Visit: Payer: Self-pay

## 2021-10-19 MED ORDER — PANTOPRAZOLE SODIUM 40 MG PO TBEC: DELAYED_RELEASE_TABLET | ORAL | 3 refills | Status: DC

## 2021-10-28 ENCOUNTER — Other Ambulatory Visit: Payer: Self-pay | Admitting: Nephrology

## 2021-10-28 DIAGNOSIS — N184 Chronic kidney disease, stage 4 (severe): Secondary | ICD-10-CM

## 2021-10-28 DIAGNOSIS — N390 Urinary tract infection, site not specified: Secondary | ICD-10-CM | POA: Diagnosis not present

## 2021-10-28 DIAGNOSIS — D631 Anemia in chronic kidney disease: Secondary | ICD-10-CM | POA: Diagnosis not present

## 2021-10-28 DIAGNOSIS — R809 Proteinuria, unspecified: Secondary | ICD-10-CM

## 2021-10-28 DIAGNOSIS — N2581 Secondary hyperparathyroidism of renal origin: Secondary | ICD-10-CM | POA: Diagnosis not present

## 2021-10-28 DIAGNOSIS — I129 Hypertensive chronic kidney disease with stage 1 through stage 4 chronic kidney disease, or unspecified chronic kidney disease: Secondary | ICD-10-CM | POA: Diagnosis not present

## 2021-10-28 DIAGNOSIS — R609 Edema, unspecified: Secondary | ICD-10-CM | POA: Diagnosis not present

## 2021-11-04 ENCOUNTER — Ambulatory Visit
Admission: RE | Admit: 2021-11-04 | Discharge: 2021-11-04 | Disposition: A | Discharge status: Home / Self Care | Payer: Medicare PPO | Source: Ambulatory Visit | Attending: Nephrology | Admitting: Nephrology

## 2021-11-04 DIAGNOSIS — R809 Proteinuria, unspecified: Secondary | ICD-10-CM

## 2021-11-04 DIAGNOSIS — N281 Cyst of kidney, acquired: Secondary | ICD-10-CM | POA: Diagnosis not present

## 2021-11-04 DIAGNOSIS — N184 Chronic kidney disease, stage 4 (severe): Secondary | ICD-10-CM | POA: Diagnosis not present

## 2021-11-09 ENCOUNTER — Ambulatory Visit (INDEPENDENT_AMBULATORY_CARE_PROVIDER_SITE_OTHER): Payer: Medicare PPO

## 2021-11-09 ENCOUNTER — Encounter: Payer: Self-pay | Admitting: Podiatry

## 2021-11-09 ENCOUNTER — Ambulatory Visit (INDEPENDENT_AMBULATORY_CARE_PROVIDER_SITE_OTHER): Payer: Medicare PPO | Admitting: Podiatry

## 2021-11-09 DIAGNOSIS — T148XXA Other injury of unspecified body region, initial encounter: Secondary | ICD-10-CM

## 2021-11-09 NOTE — Progress Notes (Signed)
Subjective:  Patient ID: Yolanda Espinoza, female    DOB: May 26, 1955,  MRN: [id number]  Chief Complaint  Patient presents with   Foot Pain    Left foot pain  Pt stated that she dropped a can on the top of her foot about a month ago and is still having some discomfort    66 y.o. female presents with the above complaint.  Patient presents with left dorsal forefoot pain.  Patient states she is dropped can of canned beans about 4 weeks ago.  She wanted to get it evaluated she has not seen anyone else prior to seeing me.  There are still some discomfort.  She has been ambulating in regular shoes pain scale 5 out of 10 aching nature.   Review of Systems: Negative except as noted in the HPI. Denies N/V/F/Ch.  Past Medical History:  Diagnosis Date   Anemia    Arthritis    Chronic diastolic heart failure (HCC)    GERD (gastroesophageal reflux disease)    Heart murmur    Hyperlipidemia    Hypertension may, 1973   Mitral regurgitation    a. Echo 01/2012 mild LVH, EF 55-65%, Gr 2 DD, mod MR, mild LAE, PASP 36;  b. Echo (02/2013):  EF 55-60%, Gr 1 DD, mild to mod MR, mild LAE (no sig change since prior echo) // c. Echo 10/18: EF 55-60, normal wall motion, grade 2 diastolic dysfunction, trivial AI, mild to moderate MR, mild LAE, trivial PI, PASP 38     OSA on CPAP    7 yrs   Seizures ([place]) 01/2017   Stroke ([place]) 07   no weakness jan 29th 2019 no defecits   Subarachnoid hemorrhage due to ruptured aneurysm ([place])    2003 - s/p coiling   Unspecified vitamin D deficiency     Current Outpatient Medications:    allopurinol (ZYLOPRIM) 100 MG tablet, Take 1 tablet  Daily to Prevent Gout, Disp: 90 tablet, Rfl: 3   amLODipine (NORVASC) 5 MG tablet, Take 1 tablet (5 mg total) by mouth daily., Disp: 30 tablet, Rfl: 0   aspirin EC 81 MG tablet, Take 1 tablet (81 mg total) by mouth daily., Disp: , Rfl:    chlorthalidone (HYGROTON) 25 MG tablet, Take 1 tablet (25 mg total) by mouth daily., Disp: 30 tablet,  Rfl: 1   cholecalciferol (VITAMIN D3) 25 MCG (1000 UT) tablet, Take 1,000 Units by mouth daily., Disp: , Rfl:    cyclobenzaprine (FLEXERIL) 10 MG tablet, Take 1 tablet at Bedtime as Needed for Muscle Spasms                                                /                   TAKE                              BY                           MOUTH, Disp: 90 tablet, Rfl: 3   famotidine (PEPCID) 20 MG tablet, TAKE ONE TABLET BY MOUTH DAILY AT 9 AM EVERY MORNING, 30-60 MINUTES PRIOR TO BREAKFAST FOR REFLUX. (Patient taking differently: Take 20 mg by mouth  at bedtime. TAKE ONE TABLET BY MOUTH DAILY AT 9 AM EVERY MORNING, 30-60 MINUTES PRIOR TO BREAKFAST FOR REFLUX.), Disp: 90 tablet, Rfl: 11   ferrous sulfate 325 (65 FE) MG tablet, Take 1 tablet (325 mg total) by mouth daily with breakfast., Disp: 90 tablet, Rfl: 2   fexofenadine (ALLEGRA ALLERGY) 180 MG tablet, Take 1 tablet (180 mg total) by mouth daily., Disp: 30 tablet, Rfl: 2   levETIRAcetam (KEPPRA) 1000 MG tablet, Take one tablet (1000mg ) daily., Disp: 90 tablet, Rfl: 0   Multiple Vitamins-Minerals (MULTIVITAMIN WITH MINERALS) tablet, Take 1 tablet by mouth daily., Disp: , Rfl:    pantoprazole (PROTONIX) 40 MG tablet, Take one tablet at bedtime, Disp: 90 tablet, Rfl: 3   rosuvastatin (CRESTOR) 5 MG tablet, Take 1 tab three days a week (MWF) for cholesterol., Disp: 36 tablet, Rfl: 3   ticagrelor (BRILINTA) 90 MG TABS tablet, Take 1 tablet  2 x /day (every 12 hours )  to prevent Blood Clots, Disp: 180 tablet, Rfl: 1  Social History   Tobacco Use  Smoking Status Former   Packs/day: 0.25   Years: 3.00   Total pack years: 0.75   Types: Cigarettes   Quit date: 01/24/1999   Years since quitting: 22.8  Smokeless Tobacco Never    Allergies  Allergen Reactions   Promethazine-Dm     Palpitations   Ace Inhibitors Nausea And Vomiting   Aspirin Nausea And Vomiting    Can take coated Aspirin   Objective:  There were no vitals filed for this  visit. There is no height or weight on file to calculate BMI. Constitutional Well developed. Well nourished.  Vascular Dorsalis pedis pulses palpable bilaterally. Posterior tibial pulses palpable bilaterally. Capillary refill normal to all digits.  No cyanosis or clubbing noted. Pedal hair growth normal.  Neurologic Normal speech. Oriented to person, place, and time. Epicritic sensation to light touch grossly present bilaterally.  Dermatologic Nails well groomed and normal in appearance. No open wounds. No skin lesions.  Orthopedic: Pain on palpation of left dorsal forefoot.  No ecchymosis or bruising noted.  Negative extensor tendinitis noted.  Lisfranc interval is intact.   Radiographs: 3 views of skeletally mature the left foot: No fractures noted no osseous abnormalities noted mild swelling noted. Assessment:   1. Contusion of soft tissue    Plan:  Patient was evaluated and treated and all questions answered.  Left dorsal foot contusion -All questions and concerns were discussed with the patient in extensive detail.  She will benefit from surgical shoe immobilization.  She was not properly immobilized and therefore her pain has not reduced as quickly.  Hopefully next 4 weeks and will be reduced. -Surgical shoe was dispensed  No follow-ups on file.

## 2021-11-17 ENCOUNTER — Encounter: Payer: Self-pay | Admitting: Internal Medicine

## 2021-11-22 DIAGNOSIS — N184 Chronic kidney disease, stage 4 (severe): Secondary | ICD-10-CM | POA: Diagnosis not present

## 2021-11-25 ENCOUNTER — Other Ambulatory Visit (HOSPITAL_COMMUNITY): Payer: Self-pay | Admitting: *Deleted

## 2021-11-28 ENCOUNTER — Encounter (HOSPITAL_COMMUNITY)
Admission: RE | Admit: 2021-11-28 | Discharge: 2021-11-28 | Disposition: A | Discharge status: Home / Self Care | Payer: Medicare PPO | Source: Ambulatory Visit | Attending: Nephrology | Admitting: Nephrology

## 2021-11-28 DIAGNOSIS — N189 Chronic kidney disease, unspecified: Secondary | ICD-10-CM | POA: Insufficient documentation

## 2021-11-28 DIAGNOSIS — D631 Anemia in chronic kidney disease: Secondary | ICD-10-CM | POA: Diagnosis not present

## 2021-11-28 MED ORDER — SODIUM CHLORIDE 0.9 % IV SOLN
200.0000 mg | INTRAVENOUS | Status: DC
  Administered 2021-11-28: 200 mg via INTRAVENOUS
  Filled 2021-11-28: qty 200

## 2021-12-05 ENCOUNTER — Encounter (HOSPITAL_COMMUNITY)
Admission: RE | Admit: 2021-12-05 | Discharge: 2021-12-05 | Disposition: A | Discharge status: Home / Self Care | Payer: Medicare PPO | Source: Ambulatory Visit | Attending: Nephrology | Admitting: Nephrology

## 2021-12-05 DIAGNOSIS — D631 Anemia in chronic kidney disease: Secondary | ICD-10-CM | POA: Diagnosis not present

## 2021-12-05 DIAGNOSIS — N189 Chronic kidney disease, unspecified: Secondary | ICD-10-CM | POA: Diagnosis not present

## 2021-12-05 MED ORDER — SODIUM CHLORIDE 0.9 % IV SOLN
200.0000 mg | INTRAVENOUS | Status: DC
  Administered 2021-12-05: 200 mg via INTRAVENOUS
  Filled 2021-12-05: qty 200

## 2021-12-12 ENCOUNTER — Encounter (HOSPITAL_COMMUNITY)
Admission: RE | Admit: 2021-12-12 | Discharge: 2021-12-12 | Disposition: A | Discharge status: Home / Self Care | Payer: Medicare PPO | Source: Ambulatory Visit | Attending: Nephrology | Admitting: Nephrology

## 2021-12-12 DIAGNOSIS — N189 Chronic kidney disease, unspecified: Secondary | ICD-10-CM | POA: Diagnosis not present

## 2021-12-12 DIAGNOSIS — D631 Anemia in chronic kidney disease: Secondary | ICD-10-CM | POA: Diagnosis not present

## 2021-12-12 MED ORDER — SODIUM CHLORIDE 0.9 % IV SOLN
200.0000 mg | INTRAVENOUS | Status: DC
  Administered 2021-12-12: 200 mg via INTRAVENOUS
  Filled 2021-12-12: qty 200

## 2021-12-19 ENCOUNTER — Encounter (HOSPITAL_COMMUNITY)
Admission: RE | Admit: 2021-12-19 | Discharge: 2021-12-19 | Disposition: A | Discharge status: Home / Self Care | Payer: Medicare PPO | Source: Ambulatory Visit | Attending: Nephrology | Admitting: Nephrology

## 2021-12-19 ENCOUNTER — Other Ambulatory Visit: Payer: Self-pay

## 2021-12-19 DIAGNOSIS — M109 Gout, unspecified: Secondary | ICD-10-CM

## 2021-12-19 DIAGNOSIS — D631 Anemia in chronic kidney disease: Secondary | ICD-10-CM | POA: Diagnosis not present

## 2021-12-19 DIAGNOSIS — N189 Chronic kidney disease, unspecified: Secondary | ICD-10-CM | POA: Diagnosis not present

## 2021-12-19 MED ORDER — ALLOPURINOL 100 MG PO TABS: ORAL_TABLET | ORAL | 3 refills | Status: DC

## 2021-12-19 MED ORDER — PANTOPRAZOLE SODIUM 40 MG PO TBEC
DELAYED_RELEASE_TABLET | ORAL | 3 refills | Status: DC
Start: 2021-12-19 — End: 2022-01-11

## 2021-12-19 MED ORDER — SODIUM CHLORIDE 0.9 % IV SOLN
200.0000 mg | INTRAVENOUS | Status: DC
  Administered 2021-12-19: 200 mg via INTRAVENOUS
  Filled 2021-12-19: qty 200

## 2021-12-19 NOTE — Addendum Note (Signed)
Addended by: Chancy Hurter on: 12/19/2021 04:47 PM   Modules accepted: Orders

## 2021-12-21 ENCOUNTER — Ambulatory Visit: Payer: Medicare PPO | Admitting: Nurse Practitioner

## 2021-12-26 ENCOUNTER — Encounter (HOSPITAL_COMMUNITY)
Admission: RE | Admit: 2021-12-26 | Discharge: 2021-12-26 | Disposition: A | Discharge status: Home / Self Care | Payer: Medicare PPO | Source: Ambulatory Visit | Attending: Nephrology | Admitting: Nephrology

## 2021-12-26 DIAGNOSIS — N189 Chronic kidney disease, unspecified: Secondary | ICD-10-CM | POA: Diagnosis not present

## 2021-12-26 DIAGNOSIS — D631 Anemia in chronic kidney disease: Secondary | ICD-10-CM | POA: Insufficient documentation

## 2021-12-26 MED ORDER — SODIUM CHLORIDE 0.9 % IV SOLN
200.0000 mg | INTRAVENOUS | Status: DC
  Administered 2021-12-26: 200 mg via INTRAVENOUS
  Filled 2021-12-26: qty 200

## 2021-12-28 ENCOUNTER — Encounter: Payer: Self-pay | Admitting: Nurse Practitioner

## 2021-12-28 ENCOUNTER — Ambulatory Visit (INDEPENDENT_AMBULATORY_CARE_PROVIDER_SITE_OTHER): Payer: Medicare PPO | Admitting: Nurse Practitioner

## 2021-12-28 VITALS — BP 140/86 | HR 84 | Temp 96.8°F | Ht 70.0 in | Wt 225.0 lb

## 2021-12-28 DIAGNOSIS — N189 Chronic kidney disease, unspecified: Secondary | ICD-10-CM

## 2021-12-28 DIAGNOSIS — Z79899 Other long term (current) drug therapy: Secondary | ICD-10-CM | POA: Diagnosis not present

## 2021-12-28 DIAGNOSIS — Z23 Encounter for immunization: Secondary | ICD-10-CM

## 2021-12-28 DIAGNOSIS — D638 Anemia in other chronic diseases classified elsewhere: Secondary | ICD-10-CM

## 2021-12-28 DIAGNOSIS — I129 Hypertensive chronic kidney disease with stage 1 through stage 4 chronic kidney disease, or unspecified chronic kidney disease: Secondary | ICD-10-CM | POA: Diagnosis not present

## 2021-12-28 DIAGNOSIS — I10 Essential (primary) hypertension: Secondary | ICD-10-CM

## 2021-12-28 DIAGNOSIS — G40909 Epilepsy, unspecified, not intractable, without status epilepticus: Secondary | ICD-10-CM

## 2021-12-28 DIAGNOSIS — Z862 Personal history of diseases of the blood and blood-forming organs and certain disorders involving the immune mechanism: Secondary | ICD-10-CM

## 2021-12-28 DIAGNOSIS — N184 Chronic kidney disease, stage 4 (severe): Secondary | ICD-10-CM | POA: Diagnosis not present

## 2021-12-28 NOTE — Progress Notes (Signed)
Follow Up  Assessment and Plan:   Medication management All medications discussed and reviewed in full. All questions and concerns regarding medications addressed.    Essential hypertension Discussed DASH (Dietary Approaches to Stop Hypertension) DASH diet is lower in sodium than a typical American diet. Cut back on foods that are high in saturated fat, cholesterol, and trans fats. Eat more whole-grain foods, fish, poultry, and nuts Remain active and exercise as tolerated daily.  Monitor BP at home-Call if greater than 130/80.   Seizure disorder ([place]) Continue Keppra. Monitor levels Discussed dosage change if levels continue to remain elevated.  CKD stage G3b/A2, GFR 30-44 and albumin creatinine ratio 30-299 mg/g (HCC) Continue to follow with Nephrology  Discussed how what you eat and drink can aide in kidney protection. Stay well hydrated. Avoid high salt foods. Avoid NSAIDS. Keep BP and BG well controlled.   Take medications as prescribed. Remain active and exercise as tolerated daily. Maintain weight.  Continue to monitor.  Anemia of chronic disease Continue to follow with Hematology as directed.  Monitor Hgb and anemia panels  Orders Placed This Encounter  Procedures   Levetiracetam level   CBC with Differential/Platelet   COMPLETE METABOLIC PANEL WITH GFR     Future Appointments  Date Time Provider [place]  01/26/2022 10:00 AM Darrol Jump, NP [facility] None  05/29/2022 10:00 AM Darrol Jump, NP [facility] None    ----------------------------------------------------------------------------------------------------------------------  HPI 66 y.o. female  presents for 3 month follow up on hypertension, cholesterol, diabetes, weight and vitamin D deficiency.   Saw [facility] 10/28/21.  Upon assessment Nephrology feels as though CKD is likely r/t hix of HTN, NSAIDS.  She is to modify BP, weight, stay well hydrated, avoid NSAID.  [place] was repeated  d/t last renal US showing complex left kidney cyst in 2017.  She is to continue Lasix and utilize compression stockings/leg elevation.   Recently completed x5 weeks of iron transfusions.  Reports feeling less fatigued.  She is continuing to take [place] for control of seizure like activity.    BMI is Body mass index is 32.28 kg/m., she has not been working on diet and exercise. Wt Readings from Last 3 Encounters:  12/28/21 225 lb (102.1 kg)  12/26/21 221 lb (100.2 kg)  12/12/21 221 lb (100.2 kg)    Her blood pressure has been controlled at home, today their BP is BP: (!) 140/86  She does not workout. She denies chest pain, shortness of breath, dizziness.   She is on cholesterol medication Rosuvastatin and denies myalgias. Her cholesterol is not at goal. The cholesterol last visit was:   Lab Results  Component Value Date   CHOL 217 (H) 09/07/2021   HDL 89 09/07/2021   LDLCALC 108 (H) 09/07/2021   TRIG 108 09/07/2021   CHOLHDL 2.4 09/07/2021    She has been working on diet and exercise for prediabetes, and denies polydipsia and polyuria. Last A1C in the office was:  Lab Results  Component Value Date   HGBA1C 5.4 09/07/2021   Patient is on Vitamin D supplement.   Lab Results  Component Value Date   VD25OH 31 09/07/2021        Current Medications:  Current Outpatient Medications on File Prior to Visit  Medication Sig   allopurinol (ZYLOPRIM) 100 MG tablet Take 1 tablet  Daily to Prevent Gout   amLODipine (NORVASC) 5 MG tablet Take 1 tablet (5 mg total) by mouth daily.   aspirin EC 81 MG tablet Take  1 tablet (81 mg total) by mouth daily.   chlorthalidone (HYGROTON) 25 MG tablet Take 1 tablet (25 mg total) by mouth daily.   cholecalciferol (VITAMIN D3) 25 MCG (1000 UT) tablet Take 1,000 Units by mouth daily.   cyclobenzaprine (FLEXERIL) 10 MG tablet Take 1 tablet at Bedtime as Needed for Muscle Spasms                                                /                   TAKE                               BY                           MOUTH   famotidine (PEPCID) 20 MG tablet TAKE ONE TABLET BY MOUTH DAILY AT 9 AM EVERY MORNING, 30-60 MINUTES PRIOR TO BREAKFAST FOR REFLUX. (Patient taking differently: Take 20 mg by mouth at bedtime. TAKE ONE TABLET BY MOUTH DAILY AT 9 AM EVERY MORNING, 30-60 MINUTES PRIOR TO BREAKFAST FOR REFLUX.)   fexofenadine (ALLEGRA ALLERGY) 180 MG tablet Take 1 tablet (180 mg total) by mouth daily.   levETIRAcetam (KEPPRA) 1000 MG tablet Take one tablet (1000mg ) daily.   Multiple Vitamins-Minerals (MULTIVITAMIN WITH MINERALS) tablet Take 1 tablet by mouth daily.   pantoprazole (PROTONIX) 40 MG tablet Take one tablet at bedtime   rosuvastatin (CRESTOR) 5 MG tablet Take 1 tab three days a week (MWF) for cholesterol.   ticagrelor (BRILINTA) 90 MG TABS tablet Take 1 tablet  2 x /day (every 12 hours )  to prevent Blood Clots   ferrous sulfate 325 (65 FE) MG tablet Take 1 tablet (325 mg total) by mouth daily with breakfast. (Patient not taking: Reported on 12/28/2021)   No current facility-administered medications on file prior to visit.     Allergies:  Allergies  Allergen Reactions   Promethazine-Dm     Palpitations   Ace Inhibitors Nausea And Vomiting   Aspirin Nausea And Vomiting    Can take coated Aspirin     Medical History:  Past Medical History:  Diagnosis Date   Anemia    Arthritis    Chronic diastolic heart failure (HCC)    GERD (gastroesophageal reflux disease)    Heart murmur    Hyperlipidemia    Hypertension may, 1973   Mitral regurgitation    a. Echo 01/2012 mild LVH, EF 55-65%, Gr 2 DD, mod MR, mild LAE, PASP 36;  b. Echo (02/2013):  EF 55-60%, Gr 1 DD, mild to mod MR, mild LAE (no sig change since prior echo) // c. Echo 10/18: EF 55-60, normal wall motion, grade 2 diastolic dysfunction, trivial AI, mild to moderate MR, mild LAE, trivial PI, PASP 38     OSA on CPAP    7 yrs   Seizures ([place]) 01/2017   Stroke ([place]) 07   no  weakness jan 29th 2019 no defecits   Subarachnoid hemorrhage due to ruptured aneurysm ([place])    2003 - s/p coiling   Unspecified vitamin D deficiency    Family history- Reviewed and unchanged Social history- Reviewed and unchanged   Review of Systems:  ROS  Physical Exam: BP (!) 140/86   Pulse 84   Temp (!) 96.8 F (36 C)   Ht 5\' 10"  (1.778 m)   Wt 225 lb (102.1 kg)   SpO2 99%   BMI 32.28 kg/m  Wt Readings from Last 3 Encounters:  12/28/21 225 lb (102.1 kg)  12/26/21 221 lb (100.2 kg)  12/12/21 221 lb (100.2 kg)   General Appearance: Well nourished, in no apparent distress. Eyes: PERRLA, EOMs, conjunctiva no swelling or erythema Sinuses: No Frontal/maxillary tenderness ENT/Mouth: Ext aud canals clear, TMs without erythema, bulging. No erythema, swelling, or exudate on post pharynx.  Tonsils not swollen or erythematous. Hearing normal.  Neck: Supple, thyroid normal.  Respiratory: Respiratory effort normal, BS equal bilaterally without rales, rhonchi, wheezing or stridor.  Cardio: RRR with no MRGs. Brisk peripheral pulses without edema.  Abdomen: Soft, + BS.  Non tender, no guarding, rebound, hernias, masses. Lymphatics: Non tender without lymphadenopathy.  Musculoskeletal: Full ROM, 5/5 strength, Normal gait Skin: Warm, dry without rashes, lesions, ecchymosis.  Neuro: Cranial nerves intact. No cerebellar symptoms.  Psych: Awake and oriented X 3, normal affect, Insight and Judgment appropriate.    Darrol Jump, NP 3:11 PM [facility]

## 2021-12-28 NOTE — Addendum Note (Signed)
Addended by: Chancy Hurter on: 12/28/2021 05:00 PM   Modules accepted: Orders

## 2021-12-29 ENCOUNTER — Other Ambulatory Visit: Payer: Self-pay | Admitting: Nurse Practitioner

## 2021-12-29 DIAGNOSIS — E875 Hyperkalemia: Secondary | ICD-10-CM

## 2021-12-29 DIAGNOSIS — I129 Hypertensive chronic kidney disease with stage 1 through stage 4 chronic kidney disease, or unspecified chronic kidney disease: Secondary | ICD-10-CM

## 2021-12-29 MED ORDER — LOKELMA 10 G PO PACK: 10.0000 g | PACK | Freq: Every day | ORAL | 1 refills | Status: DC

## 2022-01-02 DIAGNOSIS — H16143 Punctate keratitis, bilateral: Secondary | ICD-10-CM | POA: Diagnosis not present

## 2022-01-02 DIAGNOSIS — H01024 Squamous blepharitis left upper eyelid: Secondary | ICD-10-CM | POA: Diagnosis not present

## 2022-01-02 LAB — CBC WITH DIFFERENTIAL/PLATELET
Absolute Monocytes: 403 cells/uL (ref 200–950)
Basophils Absolute: 38 cells/uL (ref 0–200)
Basophils Relative: 0.5 %
Eosinophils Absolute: 365 cells/uL (ref 15–500)
Eosinophils Relative: 4.8 %
HCT: 28.1 % — ABNORMAL LOW (ref 35.0–45.0)
Hemoglobin: 8.8 g/dL — ABNORMAL LOW (ref 11.7–15.5)
Lymphs Abs: 2493 cells/uL (ref 850–3900)
MCH: 25.1 pg — ABNORMAL LOW (ref 27.0–33.0)
MCHC: 31.3 g/dL — ABNORMAL LOW (ref 32.0–36.0)
MCV: 80.3 fL (ref 80.0–100.0)
MPV: 10.1 fL (ref 7.5–12.5)
Monocytes Relative: 5.3 %
Neutro Abs: 4302 cells/uL (ref 1500–7800)
Neutrophils Relative %: 56.6 %
Platelets: 303 10*3/uL (ref 140–400)
RBC: 3.5 10*6/uL — ABNORMAL LOW (ref 3.80–5.10)
RDW: 19.6 % — ABNORMAL HIGH (ref 11.0–15.0)
Total Lymphocyte: 32.8 %
WBC: 7.6 10*3/uL (ref 3.8–10.8)

## 2022-01-02 LAB — COMPLETE METABOLIC PANEL WITH GFR
AG Ratio: 1.2 (calc) (ref 1.0–2.5)
ALT: 10 U/L (ref 6–29)
AST: 20 U/L (ref 10–35)
Albumin: 4.3 g/dL (ref 3.6–5.1)
Alkaline phosphatase (APISO): 68 U/L (ref 37–153)
BUN/Creatinine Ratio: 21 (calc) (ref 6–22)
BUN: 35 mg/dL — ABNORMAL HIGH (ref 7–25)
CO2: 24 mmol/L (ref 20–32)
Calcium: 10 mg/dL (ref 8.6–10.4)
Chloride: 112 mmol/L — ABNORMAL HIGH (ref 98–110)
Creat: 1.68 mg/dL — ABNORMAL HIGH (ref 0.50–1.05)
Globulin: 3.5 g/dL (calc) (ref 1.9–3.7)
Glucose, Bld: 90 mg/dL (ref 65–99)
Potassium: 6.1 mmol/L — ABNORMAL HIGH (ref 3.5–5.3)
Sodium: 142 mmol/L (ref 135–146)
Total Bilirubin: 0.2 mg/dL (ref 0.2–1.2)
Total Protein: 7.8 g/dL (ref 6.1–8.1)
eGFR: 33 mL/min/{1.73_m2} — ABNORMAL LOW (ref >=60)

## 2022-01-02 LAB — LEVETIRACETAM LEVEL: Keppra (Levetiracetam): 39.7 ug/mL

## 2022-01-03 ENCOUNTER — Telehealth: Payer: Self-pay | Admitting: Nurse Practitioner

## 2022-01-03 NOTE — Telephone Encounter (Signed)
Insurance called wanting to check the status on PA for [facility]

## 2022-01-04 ENCOUNTER — Ambulatory Visit (INDEPENDENT_AMBULATORY_CARE_PROVIDER_SITE_OTHER): Payer: Medicare PPO

## 2022-01-04 VITALS — BP 124/82 | HR 79 | Temp 97.2°F | Ht 70.0 in | Wt 221.6 lb

## 2022-01-04 DIAGNOSIS — Z79899 Other long term (current) drug therapy: Secondary | ICD-10-CM

## 2022-01-04 DIAGNOSIS — N184 Chronic kidney disease, stage 4 (severe): Secondary | ICD-10-CM | POA: Diagnosis not present

## 2022-01-04 DIAGNOSIS — E875 Hyperkalemia: Secondary | ICD-10-CM | POA: Diagnosis not present

## 2022-01-04 DIAGNOSIS — I129 Hypertensive chronic kidney disease with stage 1 through stage 4 chronic kidney disease, or unspecified chronic kidney disease: Secondary | ICD-10-CM | POA: Diagnosis not present

## 2022-01-05 LAB — COMPLETE METABOLIC PANEL WITH GFR
AG Ratio: 1.2 (calc) (ref 1.0–2.5)
ALT: 13 U/L (ref 6–29)
AST: 19 U/L (ref 10–35)
Albumin: 4.3 g/dL (ref 3.6–5.1)
Alkaline phosphatase (APISO): 70 U/L (ref 37–153)
BUN/Creatinine Ratio: 24 (calc) — ABNORMAL HIGH (ref 6–22)
BUN: 45 mg/dL — ABNORMAL HIGH (ref 7–25)
CO2: 22 mmol/L (ref 20–32)
Calcium: 9.8 mg/dL (ref 8.6–10.4)
Chloride: 108 mmol/L (ref 98–110)
Creat: 1.86 mg/dL — ABNORMAL HIGH (ref 0.50–1.05)
Globulin: 3.7 g/dL (calc) (ref 1.9–3.7)
Glucose, Bld: 86 mg/dL (ref 65–99)
Potassium: 5.3 mmol/L (ref 3.5–5.3)
Sodium: 139 mmol/L (ref 135–146)
Total Bilirubin: 0.2 mg/dL (ref 0.2–1.2)
Total Protein: 8 g/dL (ref 6.1–8.1)
eGFR: 30 mL/min/{1.73_m2} — ABNORMAL LOW (ref >=60)

## 2022-01-09 ENCOUNTER — Telehealth: Payer: Self-pay

## 2022-01-09 NOTE — Telephone Encounter (Signed)
Prior Authorization for [facility] complete and submitted to covermymeds

## 2022-01-10 NOTE — Telephone Encounter (Signed)
PA denied.

## 2022-01-11 ENCOUNTER — Other Ambulatory Visit: Payer: Self-pay

## 2022-01-11 MED ORDER — FAMOTIDINE 20 MG PO TABS
ORAL_TABLET | ORAL | 0 refills | Status: DC
Start: 2022-01-11 — End: 2022-03-27

## 2022-01-11 MED ORDER — AMLODIPINE BESYLATE 5 MG PO TABS: 5.0000 mg | ORAL_TABLET | Freq: Every day | ORAL | 0 refills | Status: DC

## 2022-01-11 MED ORDER — PANTOPRAZOLE SODIUM 40 MG PO TBEC: DELAYED_RELEASE_TABLET | ORAL | 3 refills | Status: DC

## 2022-01-12 ENCOUNTER — Other Ambulatory Visit: Payer: Self-pay | Admitting: Internal Medicine

## 2022-01-12 DIAGNOSIS — I10 Essential (primary) hypertension: Secondary | ICD-10-CM

## 2022-01-18 ENCOUNTER — Other Ambulatory Visit: Payer: Self-pay | Admitting: Internal Medicine

## 2022-01-18 DIAGNOSIS — E559 Vitamin D deficiency, unspecified: Secondary | ICD-10-CM | POA: Diagnosis not present

## 2022-01-18 DIAGNOSIS — N184 Chronic kidney disease, stage 4 (severe): Secondary | ICD-10-CM | POA: Diagnosis not present

## 2022-01-18 DIAGNOSIS — M109 Gout, unspecified: Secondary | ICD-10-CM

## 2022-01-19 MED ORDER — OLMESARTAN MEDOXOMIL 40 MG PO TABS: ORAL_TABLET | ORAL | 3 refills | Status: DC

## 2022-01-26 ENCOUNTER — Encounter: Payer: Medicare PPO | Admitting: Nurse Practitioner

## 2022-02-02 ENCOUNTER — Other Ambulatory Visit: Payer: Self-pay | Admitting: Nurse Practitioner

## 2022-02-02 DIAGNOSIS — N2581 Secondary hyperparathyroidism of renal origin: Secondary | ICD-10-CM | POA: Diagnosis not present

## 2022-02-02 DIAGNOSIS — D631 Anemia in chronic kidney disease: Secondary | ICD-10-CM | POA: Diagnosis not present

## 2022-02-02 DIAGNOSIS — R609 Edema, unspecified: Secondary | ICD-10-CM | POA: Diagnosis not present

## 2022-02-02 DIAGNOSIS — R809 Proteinuria, unspecified: Secondary | ICD-10-CM | POA: Diagnosis not present

## 2022-02-02 DIAGNOSIS — I129 Hypertensive chronic kidney disease with stage 1 through stage 4 chronic kidney disease, or unspecified chronic kidney disease: Secondary | ICD-10-CM | POA: Diagnosis not present

## 2022-02-02 DIAGNOSIS — N1832 Chronic kidney disease, stage 3b: Secondary | ICD-10-CM | POA: Diagnosis not present

## 2022-02-17 ENCOUNTER — Other Ambulatory Visit: Payer: Self-pay

## 2022-02-20 ENCOUNTER — Encounter (HOSPITAL_COMMUNITY)
Admission: RE | Admit: 2022-02-20 | Discharge: 2022-02-20 | Disposition: A | Discharge status: Home / Self Care | Payer: Medicare PPO | Source: Ambulatory Visit | Attending: Nephrology | Admitting: Nephrology

## 2022-02-20 VITALS — BP 140/74 | HR 88 | Temp 97.0°F | Resp 18

## 2022-02-20 DIAGNOSIS — D631 Anemia in chronic kidney disease: Secondary | ICD-10-CM | POA: Insufficient documentation

## 2022-02-20 DIAGNOSIS — N1832 Chronic kidney disease, stage 3b: Secondary | ICD-10-CM | POA: Diagnosis not present

## 2022-02-20 DIAGNOSIS — D638 Anemia in other chronic diseases classified elsewhere: Secondary | ICD-10-CM | POA: Diagnosis present

## 2022-02-20 MED ORDER — EPOETIN ALFA-EPBX 10000 UNIT/ML IJ SOLN
INTRAMUSCULAR | Status: AC
  Filled 2022-02-20: qty 2

## 2022-02-20 MED ORDER — EPOETIN ALFA-EPBX 10000 UNIT/ML IJ SOLN
20000.0000 [IU] | INTRAMUSCULAR | Status: DC
  Administered 2022-02-20: 20000 [IU] via SUBCUTANEOUS

## 2022-02-21 LAB — POCT HEMOGLOBIN-HEMACUE: Hemoglobin: 8 g/dL — ABNORMAL LOW (ref 12.0–15.0)

## 2022-03-07 DIAGNOSIS — R609 Edema, unspecified: Secondary | ICD-10-CM | POA: Diagnosis not present

## 2022-03-07 DIAGNOSIS — I129 Hypertensive chronic kidney disease with stage 1 through stage 4 chronic kidney disease, or unspecified chronic kidney disease: Secondary | ICD-10-CM | POA: Diagnosis not present

## 2022-03-07 DIAGNOSIS — D631 Anemia in chronic kidney disease: Secondary | ICD-10-CM | POA: Diagnosis not present

## 2022-03-07 DIAGNOSIS — N189 Chronic kidney disease, unspecified: Secondary | ICD-10-CM | POA: Diagnosis not present

## 2022-03-07 DIAGNOSIS — N1832 Chronic kidney disease, stage 3b: Secondary | ICD-10-CM | POA: Diagnosis not present

## 2022-03-07 DIAGNOSIS — N2581 Secondary hyperparathyroidism of renal origin: Secondary | ICD-10-CM | POA: Diagnosis not present

## 2022-03-07 DIAGNOSIS — R809 Proteinuria, unspecified: Secondary | ICD-10-CM | POA: Diagnosis not present

## 2022-03-09 ENCOUNTER — Encounter (HOSPITAL_COMMUNITY): Payer: Self-pay

## 2022-03-13 ENCOUNTER — Encounter (HOSPITAL_COMMUNITY): Payer: Self-pay

## 2022-03-20 ENCOUNTER — Encounter (HOSPITAL_COMMUNITY): Payer: Medicare PPO

## 2022-03-24 ENCOUNTER — Encounter (HOSPITAL_COMMUNITY): Payer: Self-pay

## 2022-03-26 ENCOUNTER — Other Ambulatory Visit: Payer: Self-pay | Admitting: Nurse Practitioner

## 2022-03-31 ENCOUNTER — Ambulatory Visit (HOSPITAL_COMMUNITY)
Admission: RE | Admit: 2022-03-31 | Discharge: 2022-03-31 | Disposition: A | Discharge status: Home / Self Care | Payer: Medicare PPO | Source: Ambulatory Visit | Attending: Nephrology | Admitting: Nephrology

## 2022-03-31 VITALS — BP 140/80 | HR 91 | Temp 97.0°F

## 2022-03-31 DIAGNOSIS — D649 Anemia, unspecified: Secondary | ICD-10-CM | POA: Diagnosis not present

## 2022-03-31 DIAGNOSIS — D638 Anemia in other chronic diseases classified elsewhere: Secondary | ICD-10-CM | POA: Insufficient documentation

## 2022-03-31 LAB — FERRITIN: Ferritin: 140 ng/mL (ref 11–307)

## 2022-03-31 LAB — IRON AND TIBC
Iron: 86 ug/dL (ref 28–170)
Saturation Ratios: 27 % (ref 10.4–31.8)
TIBC: 322 ug/dL (ref 250–450)
UIBC: 236 ug/dL

## 2022-03-31 LAB — POCT HEMOGLOBIN-HEMACUE: Hemoglobin: 8.7 g/dL — ABNORMAL LOW (ref 12.0–15.0)

## 2022-03-31 MED ORDER — EPOETIN ALFA-EPBX 10000 UNIT/ML IJ SOLN
20000.0000 [IU] | INTRAMUSCULAR | Status: DC
  Administered 2022-03-31: 20000 [IU] via SUBCUTANEOUS

## 2022-03-31 MED ORDER — EPOETIN ALFA-EPBX 10000 UNIT/ML IJ SOLN
INTRAMUSCULAR | Status: AC
  Filled 2022-03-31: qty 2

## 2022-04-03 ENCOUNTER — Ambulatory Visit (INDEPENDENT_AMBULATORY_CARE_PROVIDER_SITE_OTHER): Payer: Medicare PPO | Admitting: Nurse Practitioner

## 2022-04-03 ENCOUNTER — Encounter: Payer: Self-pay | Admitting: Nurse Practitioner

## 2022-04-03 VITALS — BP 140/80 | HR 72 | Temp 97.7°F | Ht 70.0 in | Wt 208.6 lb

## 2022-04-03 DIAGNOSIS — K219 Gastro-esophageal reflux disease without esophagitis: Secondary | ICD-10-CM

## 2022-04-03 DIAGNOSIS — I10 Essential (primary) hypertension: Secondary | ICD-10-CM | POA: Diagnosis not present

## 2022-04-03 DIAGNOSIS — N184 Chronic kidney disease, stage 4 (severe): Secondary | ICD-10-CM

## 2022-04-03 DIAGNOSIS — R053 Chronic cough: Secondary | ICD-10-CM

## 2022-04-03 DIAGNOSIS — E785 Hyperlipidemia, unspecified: Secondary | ICD-10-CM | POA: Diagnosis not present

## 2022-04-03 DIAGNOSIS — R7309 Other abnormal glucose: Secondary | ICD-10-CM | POA: Diagnosis not present

## 2022-04-03 DIAGNOSIS — I714 Abdominal aortic aneurysm, without rupture, unspecified: Secondary | ICD-10-CM

## 2022-04-03 DIAGNOSIS — Z0000 Encounter for general adult medical examination without abnormal findings: Secondary | ICD-10-CM | POA: Diagnosis not present

## 2022-04-03 DIAGNOSIS — Z136 Encounter for screening for cardiovascular disorders: Secondary | ICD-10-CM | POA: Diagnosis not present

## 2022-04-03 DIAGNOSIS — G40909 Epilepsy, unspecified, not intractable, without status epilepticus: Secondary | ICD-10-CM | POA: Diagnosis not present

## 2022-04-03 DIAGNOSIS — I700 Atherosclerosis of aorta: Secondary | ICD-10-CM

## 2022-04-03 DIAGNOSIS — R9431 Abnormal electrocardiogram [ECG] [EKG]: Secondary | ICD-10-CM

## 2022-04-03 DIAGNOSIS — D638 Anemia in other chronic diseases classified elsewhere: Secondary | ICD-10-CM

## 2022-04-03 DIAGNOSIS — E538 Deficiency of other specified B group vitamins: Secondary | ICD-10-CM

## 2022-04-03 DIAGNOSIS — I5032 Chronic diastolic (congestive) heart failure: Secondary | ICD-10-CM | POA: Diagnosis not present

## 2022-04-03 DIAGNOSIS — G4733 Obstructive sleep apnea (adult) (pediatric): Secondary | ICD-10-CM

## 2022-04-03 DIAGNOSIS — M109 Gout, unspecified: Secondary | ICD-10-CM

## 2022-04-03 DIAGNOSIS — I340 Nonrheumatic mitral (valve) insufficiency: Secondary | ICD-10-CM | POA: Diagnosis not present

## 2022-04-03 DIAGNOSIS — I609 Nontraumatic subarachnoid hemorrhage, unspecified: Secondary | ICD-10-CM

## 2022-04-03 DIAGNOSIS — Z0001 Encounter for general adult medical examination with abnormal findings: Secondary | ICD-10-CM

## 2022-04-03 DIAGNOSIS — E559 Vitamin D deficiency, unspecified: Secondary | ICD-10-CM | POA: Diagnosis not present

## 2022-04-03 DIAGNOSIS — I671 Cerebral aneurysm, nonruptured: Secondary | ICD-10-CM

## 2022-04-03 DIAGNOSIS — Z79899 Other long term (current) drug therapy: Secondary | ICD-10-CM

## 2022-04-03 NOTE — Patient Instructions (Addendum)
[email or web address]  Food Basics for Chronic Kidney Disease Chronic kidney disease (CKD) is when your kidneys are not working well. They cannot remove waste, fluids, and other substances from your blood the way they should. These substances can build up, which can worsen kidney damage and affect how your body works. Eating certain foods can lead to a buildup of these substances. Changing your diet can help prevent more kidney damage. Diet changes may also delay dialysis or even keep you from needing it. What nutrients should I limit? Work with your treatment team and a food expert (dietitian) to make a meal plan that's right for you. Foods you can eat and foods you should limit or avoid will depend on the stage of your kidney disease and any other health conditions you have. The items listed below are not a complete list. Talk with your dietitian to learn what is best for you. Potassium Potassium affects how steadily your heart beats. Too much potassium in your blood can cause an irregular heartbeat or even a heart attack. You may need to limit foods that are high in potassium, such as: Liquid milk and soy milk. Salt substitutes that contain potassium. Fruits like bananas, apricots, nectarines, melon, prunes, raisins, kiwi, and oranges. Vegetables, such as potatoes, sweet potatoes, yams, tomatoes, leafy greens, beets, avocado, pumpkin, and winter squash. Beans, like lima beans. Nuts. Phosphorus Phosphorus is a mineral found in your bones. You need a balance between calcium and phosphorus to build and maintain healthy bones. Too much added phosphorus from the foods you eat can pull calcium from your bones. Losing calcium can make your bones weak and more likely to break. Too much phosphorus can also make your skin itch. You may need to limit foods that are high in phosphorus or that have added phosphorus, such as: Liquid milk and dairy products. Dark-colored sodas or soft drinks. Bran cereals and  oatmeal. Protein  Protein helps you make and keep muscle. Protein also helps to repair your body's cells and tissues. One of the natural breakdown products of protein is a waste product called urea. When your kidneys are not working well, they cannot remove types of waste like urea. Reducing protein in your diet can help keep urea from building up in your blood. Depending on your stage of kidney disease, you may need to eat smaller portions of foods that are high in protein. Sources of animal protein include: Meat (all types). Fish and seafood. Poultry. Eggs. Dairy. Other protein foods include: Beans and legumes. Nuts and nut butter. Soy, like tofu.  Sodium Salt (sodium) helps to keep a healthy balance of fluids in your body. Too much salt can increase your blood pressure, which can harm your heart and lungs. Extra salt can also cause your body to keep too much fluid, making your kidneys work harder. You may need to limit or avoid foods that are high in salt, such as: Salt seasonings. Soy and teriyaki sauce. Packaged, precooked, cured, or processed meats, such as sausages or meat loaves. Sardines. Salted crackers and snack foods. Fast food. Canned soups and most canned foods. Pickled foods. Vegetable juice. Boxed mixes or ready-to-eat boxed meals and side dishes. Bottled dressings, sauces, and marinades. Talk with your dietitian about how much potassium, phosphorus, protein, and salt you may have each day. Helpful tips Read food labels  Check the amount of salt in foods. Limit foods that have salt or sodium listed among the first five ingredients. Try to eat low-salt foods. Check the  ingredient list for added phosphorus or potassium. "Phos" in an ingredient is a sign that phosphorus has been added. Do not buy foods that are calcium-enriched or that have calcium added to them (are fortified). Buy canned vegetables and beans that say "no salt added" and rinse them before  eating. Lifestyle Limit the amount of protein you eat from animal sources each day. Focus on protein from plant sources, like tofu and dried beans, peas, and lentils. Do not add salt to food when cooking or before eating. Do not eat star fruit. It can be toxic for people with kidney problems. Talk with your health care provider before taking any vitamin or mineral supplements. If told by your health care provider, track how much liquid you drink so you can avoid drinking too much. You may need to include foods you eat that are made mostly from water, like gelatin, ice cream, soups, and juicy fruits and vegetables. If you have diabetes: If you have diabetes (diabetes mellitus) and CKD, you need to keep your blood sugar (glucose) in the target range recommended by your health care provider. Follow your diabetes management plan. This may include: Checking your blood glucose regularly. Taking medicines by mouth, or taking insulin, or both. Exercising for at least 30 minutes on 5 or more days each week, or as told by your health care provider. Tracking how many servings of carbohydrates you eat at each meal. Not using orange juice to treat low blood sugars. Instead, use apple juice, cranberry juice, or clear soda. You may be given guidelines on what foods and nutrients you may eat, and how much you can have each day. This depends on your stage of kidney disease and whether you have high blood pressure (hypertension). Follow the meal plan your dietitian gives you. To learn more: [employer] of Diabetes and Digestive and Kidney Diseases: [email or web address]: kidney.org Summary Chronic kidney disease (CKD) is when your kidneys are not working well. They cannot remove waste, fluids, and other substances from your blood the way they should. These substances can build up, which can worsen kidney damage and affect how your body works. Changing your diet can help prevent more  kidney damage. Diet changes may also delay dialysis or even keep you from needing it. Diet changes are different for each person with CKD. Work with a dietitian to set up a meal plan that is right for you. This information is not intended to replace advice given to you by your health care provider. Make sure you discuss any questions you have with your health care provider. Document Revised: 04/29/2021 Document Reviewed: 05/05/2019 [employer] Patient Education  [place].

## 2022-04-03 NOTE — Progress Notes (Signed)
CPE  Assessment and Plan:  Annual Physical Due annually  Health maintenance reviewed  Chronic diastolic heart failure Emphasized need for weight loss  Emphasized salt restriction, less than '2000mg'$  a day. Encouraged daily monitoring of the patient's weight, call office if 3 lb weight loss or gain in a day.  Encouraged regular exercise.  decrease your fluid intake to less than 2 L daily  Cardiology is managing; appears euvolemic today   Mitral regurgitation Monitored by cardio, moderate per recent ECHO/MRI, has next planned in 2024, no SOB/CP  Essential hypertension Discussed DASH (Dietary Approaches to Stop Hypertension) DASH diet is lower in sodium than a typical American diet. Cut back on foods that are high in saturated fat, cholesterol, and trans fats. Eat more whole-grain foods, fish, poultry, and nuts Remain active and exercise as tolerated daily.  Monitor BP at home-Call if greater than 130/80.  Check CMP/CBC   Abnormal glucose Education: Reviewed 'ABCs' of diabetes management  Discussed goals to be met and/or maintained include A1C (<7) Blood pressure (<130/80) Cholesterol (LDL <70) Continue Eye Exam yearly  Continue Dental Exam Q6 mo Discussed dietary recommendations Discussed Physical Activity recommendations Check A1C   Hyperlipidemia Discussed lifestyle modifications. Recommended diet heavy in fruits and veggies, omega 3's. Decrease consumption of animal meats, cheeses, and dairy products. Remain active and exercise as tolerated. Continue to monitor. Check lipids/TSH  Vitamin D deficiency Continue supplement for goal of 60-100 Monitor levels  Medication management All medications discussed and reviewed in full. All questions and concerns regarding medications addressed.     OSA on CPAP Continue compliance Dr. Brett Fairy following Weight loss encouraged   Brain aneurysm Dr. Estanislado Pandy released S/p coil, on brilinta Control blood pressure,  cholesterol, glucose, increase exercise.    Seizure disorder ([place]) R/t CVA/aneurysm; Dr. 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 Brett Fairy follows -   Gout of foot, unspecified cause, unspecified chronicity, unspecified laterality Recheck Uric acid as needed,  Low purine diet discussed Continue medications.  Gastroesophageal reflux disease, esophagitis presence not specified No suspected reflux complications ([place]/stricture). Lifestyle modification:  wt loss, avoid meals 2-3h before bedtime. Consider eliminating food triggers:  chocolate, caffeine, EtOH, acid/spicy food.   Morbid obesity ([place]) - BMI 30+ with OSA Improvement Discussed appropriate BMI Diet modification. Physical activity. Encouraged/praised to build confidence.   Chronic kidney disease stage 3b [facility]) Nephrology following Discussed how what you eat and drink can aide in kidney protection. Stay well hydrated. Avoid high salt foods. Avoid NSAIDS. Keep BP and BG well controlled.   Take medications as prescribed. Remain active and exercise as tolerated daily. Maintain weight.  Continue to monitor. Check CMP/GFR/Microablumin  Anemia of chronic renal disease Followed by hematology; nephrology  Continue to monitor  Persistent cough Cough- multifactorial-   Continue PPI , take consistently,  Add antihistamine if needed Continue CPAP If not improved follow up Consider ENT evaluation for laryngoscopy   Prolonged QT Followed by cardiology Dr. Acie Fredrickson Avoid QT prolonging meds Could be r/t Keppra use Continue to monitor levels  B12 deficiency Continue supplement Continue to monitor  AAA screening/ abdominal aortic aneurysm ([place]) AAA [place] was + today at <3 Continue to monitor  Orders Placed This  Encounter  Procedures   CBC with Differential/Platelet   COMPLETE METABOLIC PANEL WITH GFR  Magnesium   Lipid panel   TSH   Hemoglobin A1c   Insulin, random   VITAMIN D 25 Hydroxy (Vit-D Deficiency, Fractures)   Urinalysis, Routine w reflex microscopic   Microalbumin / creatinine urine ratio   Uric acid   Levetiracetam, Immunoassay   Iron, TIBC and Ferritin Panel   [place], retroperitnl abd,  ltd   EKG 12-Lead     Notify office for further evaluation and treatment, questions or concerns if any reported s/s fail to improve.   The patient was advised to call back or seek an in-person evaluation if any symptoms worsen or if the condition fails to improve as anticipated.   Further disposition pending results of labs. Discussed med's effects and SE's.    I discussed the assessment and treatment plan with the patient. The patient was provided an opportunity to ask questions and all were answered. The patient agreed with the plan and demonstrated an understanding of the instructions.  Discussed med's effects and SE's. Screening labs and tests as requested with regular follow-up as recommended.  I provided 40 minutes of face-to-face time during this encounter including counseling, chart review, and critical decision making was preformed.'   Future Appointments  Date Time Provider Vincent  04/28/2022 12:45 PM [facility] None  05/29/2022 10:00 AM Darrol Jump, NP [facility] None  04/04/2023  2:00 PM Darrol Jump, NP [facility] None    Plan:   During the course of the visit the patient was educated and counseled about appropriate screening and preventive services including:   Pneumococcal vaccine  Prevnar 13 Influenza vaccine Td vaccine Screening electrocardiogram Bone densitometry screening Colorectal cancer screening Diabetes screening Glaucoma screening Nutrition counseling  Advanced directives: requested   HPI  67 y.o. female  presents for  CPE and 3 month follow up. She has Essential hypertension; Mitral regurgitation; Chronic diastolic heart failure ([place]); OSA on CPAP; Vitamin D deficiency; Hyperlipidemia; Brain aneurysm; Abnormal glucose; Medication management; Morbid obesity ([place]) - BMI 30+ with OSA; Gout; Gastroesophageal reflux disease; Anemia of chronic disease- renal; Allergic rhinitis; Persistent cough; CKD stage G3b/A2, GFR 30-44 and albumin creatinine ratio 30-299 mg/g ([place]); History of anemia due to chronic kidney disease; Nocturia more than twice per night; Chronic spontaneous subarach intracran bleed due to cerebral aneurysm ([place]); Seizure disorder ([place]); Severe obstructive sleep apnea-hypopnea syndrome; Prolonged Q-T interval on ECG; B12 deficiency; Deterioration in renal function; and Hyperkalemia on their problem list.  husband passed in Dec 04 2020 after hospice. Family supports, 2 boys, 4 granddaughters, 1 great grandson.   She works at [employer], going to work part time- 4 hours 2-3 days a week.   She was admitted 01/2017 for left ICA aneursym for a pipeline stent but had a seizure and AMS after her procedure, she is on brilinta, she was following with Dr. Corena Pilgrim but was released as no seizures since that time, continues on keppra, today states taking 1000 mg BID despite elevation and recommendations to reduce at last visit. Mas mild residual R arm weakness and some spasticity. Had follow up MRI 04/30/2020 which showed essentially unchanged, stable chronic microvascular ischemia, redemonstrated punctate chronic microhemorrhage.  She was on CPAP since 2013, had at [facility], was doing well but recently CPAP machine was recalled, needing replacement. She was referred to Dr. Brett Fairy, recent sleep study 04/2020 showed severe OSA with REM hypoxia, AHI was 41.8/h, recommended CPAP at 16 cm. CPAP finally arrived, pending supplies.   She has reported persistent dry cough, triggered  by inhalation, drinking  water or any food intake, but notably worse at night. She reports coughing with drinking or food is ongoing since her aneurysm/stroke/seizure, but has noted worse coughing at night. Was started on famotidine and PPI, reduced caffeine with some improvement.   She is on estrogen pills for hot flashes, had been taking premarin 0.9 mg for over 10 years, last visit reduced to 0.45 mg  Saw [facility] 02/02/22 for a f/u apt.  After her last visit she underwent Venofer 200 mg x5 doses, feels better.  She continued to have x1 episode of hyperkalemia in 12/2021 and was treated with Kayexalate.  She is really trying to keep K+ out of her diet.  She is set to f/u in 1 mo.  BMI is Body mass index is 29.93 kg/m., she has been working on diet and exercise.  Wt Readings from Last 3 Encounters:  04/03/22 208 lb 9.6 oz (94.6 kg)  01/04/22 221 lb 9.6 oz (100.5 kg)  12/28/21 225 lb (102.1 kg)   PVD and CHF MR, follows with Dr. Sondra Come.  Had ECHO 07/2019 showing normal LV function, Mod MR, mass in the RA, but follow up cardiac MRI on 10/02/2019 showed no evidence of atrial mass.   Her blood pressure has been controlled at home, states has been good at home, today their BP is BP: (!) 140/80 She does workout, walks daily for 30 mins. She denies chest pain, shortness of breath, dizziness.   She is not on cholesterol medication and denies myalgias. Her cholesterol is not at goal but typically well controlled with lifestyle. The cholesterol last visit was:   Lab Results  Component Value Date   CHOL 217 (H) 09/07/2021   HDL 89 09/07/2021   LDLCALC 108 (H) 09/07/2021   TRIG 108 09/07/2021   CHOLHDL 2.4 09/07/2021   She has been working on diet and exercise for glucose management, and denies paresthesia of the feet, polydipsia, polyuria and visual disturbances. Last A1C in the office was:  Lab Results  Component Value Date   HGBA1C 5.4 09/07/2021    Patient is on allopurinol 100 mg daily for gout, does not report  a recent flare.  Lab Results  Component Value Date   LABURIC 6.1 09/07/2021   Patient is on Vitamin D supplement, taking 1 caps of unsure dose ? 5000 IU Lab Results  Component Value Date   VD25OH 31 09/07/2021     She has CKD with deterioration in thelast year and was referred to [place] saw in April and numbers were improved -  Lab Results  Component Value Date   CREATININE 1.86 (H) 01/04/2022   CREATININE 1.68 (H) 12/28/2021   CREATININE 2.32 (H) 10/03/2021   Lab Results  Component Value Date   EGFR 30 (L) 01/04/2022   EGFR 33 (L) 12/28/2021   EGFR 23 (L) 10/03/2021   She has persistent anemia, was recently referred to evaluation by hematology with unremarkable workup in 2022, biopsy/aspiration were nonspecific and felt likely anemia of chronic disease secondary renal (has upcoming appointment with nephrology). Some iron def as well and completed 2-3 months of ferrous sulphate.  Dr. Earlie Server follows.     Latest Ref Rng & Units 03/31/2022   11:52 AM 02/20/2022    9:47 AM 12/28/2021    3:18 PM  CBC  WBC 3.8 - 10.8 Thousand/uL   7.6   Hemoglobin 12.0 - 15.0 g/dL 8.7  8.0  8.8   Hematocrit 35.0 -  45.0 %   28.1   Platelets 140 - 400 Thousand/uL   303    Lab Results  Component Value Date   IRON 86 03/31/2022   TIBC 322 03/31/2022   FERRITIN 140 03/31/2022   She is unsure of her B12 supplement dose, was advised to reduce after last visit  Lab Results  Component Value Date   VITAMINB12 >2,000 (H) 01/26/2021     Current Medications:    Current Outpatient Medications (Cardiovascular):    amLODipine (NORVASC) 5 MG tablet, TAKE 1 TABLET EVERY DAY   chlorthalidone (HYGROTON) 25 MG tablet, Take 1 tablet (25 mg total) by mouth daily.   olmesartan (BENICAR) 40 MG tablet, Take 1 tablet  Daily  for BP   rosuvastatin (CRESTOR) 5 MG tablet, Take 1 tab three days a week (MWF) for cholesterol.  Current Outpatient Medications (Respiratory):    fexofenadine (ALLEGRA  ALLERGY) 180 MG tablet, Take 1 tablet (180 mg total) by mouth daily.  Current Outpatient Medications (Analgesics):    allopurinol (ZYLOPRIM) 100 MG tablet, TAKE 1 TABLET DAILY TO PREVENT GOUT   aspirin EC 81 MG tablet, Take 1 tablet (81 mg total) by mouth daily.  Current Outpatient Medications (Hematological):    ticagrelor (BRILINTA) 90 MG TABS tablet, Take 1 tablet  2 x /day (every 12 hours )  to prevent Blood Clots (Patient taking differently: Take one tablet daily for blood clots)  Current Outpatient Medications (Other):    cholecalciferol (VITAMIN D3) 25 MCG (1000 UT) tablet, Take 1,000 Units by mouth daily.   cyclobenzaprine (FLEXERIL) 10 MG tablet, Take 1 tablet at Bedtime as Needed for Muscle Spasms                                                /                   TAKE                              BY                           MOUTH   famotidine (PEPCID) 20 MG tablet, TAKE 1 TABLET BY MOUTH DAILY AT 9 AM EVERY MORNING, 30 TO 60 MINUTES PRIOR TO BREAKFAST FOR REFLUX.   levETIRAcetam (KEPPRA) 1000 MG tablet, Take one tablet ('1000mg'$ ) daily.   Multiple Vitamins-Minerals (MULTIVITAMIN WITH MINERALS) tablet, Take 1 tablet by mouth daily.   pantoprazole (PROTONIX) 40 MG tablet, Take one tablet at bedtime   sodium zirconium cyclosilicate (LOKELMA) 10 g PACK packet, Take 10 g by mouth daily. (Patient not taking: Reported on 04/03/2022)  Health Maintenance:   Immunization History  Administered Date(s) Administered   Influenza Inj Mdck Quad With Preservative 10/30/2018   Influenza Split 11/11/2013, 10/23/2014   Influenza, High Dose Seasonal PF 01/26/2021, 12/28/2021   Influenza, Seasonal, Injecte, Preservative Fre 10/26/2015   Influenza,inj,Quad PF,6+ Mos 02/23/2017   Influenza-Unspecified 11/25/2012   Moderna SARS-COV2 Booster Vaccination 03/06/2020   Moderna Sars-Covid-2 Vaccination 02/13/2019, 03/13/2019   PNEUMOCOCCAL CONJUGATE-20 01/26/2021   Tdap 08/25/2013   Health Maintenance   Topic Date Due   Zoster Vaccines- Shingrix (1 of 2) Never done   COVID-19 Vaccine (3 - Moderna risk series) 04/03/2020  Medicare Annual Wellness (AWV)  06/02/2022   MAMMOGRAM  05/14/2023   DTaP/Tdap/Td (2 - Td or Tdap) 08/26/2023   COLONOSCOPY (Pts 45-65yr Insurance coverage will need to be confirmed)  09/05/2023   Pneumonia Vaccine 67 Years old  Completed   INFLUENZA VACCINE  Completed   DEXA SCAN  Completed   Hepatitis C Screening  Completed   HPV VACCINES  Aged Out   Shingrix: discussed, can get at pharmacy  Covid 19: 2/2, 2021, moderna, + booster 02/2020  Pap: TAH, DONE  MGM: 05/13/2021, [facility] DEXA: 05/13/21 T -0.9, normal    Colonoscopy: 10/2018 Dr. MCollene Mares 5 year recall, hx of polyps EGD: 10/2018 Dr. MCollene Mares Last Dental Exam: Dental works, last 2023, as partial/bridges,  Last Eye Exam: Dr. MEfrain Sella last 03/2021, wears glasses, mild cataract   Patient Care Team: MUnk Pinto MD as PCP - General (Internal Medicine) Nahser, PWonda Cheng MD as PCP - Cardiology (Cardiology) WLiliane ShiPA-C as Physician Assistant (Physician Assistant) MJuanita Craver MD as Consulting Physician (Gastroenterology) BMcarthur Rossetti MD as Consulting Physician (Orthopedic Surgery) DLuanne Bras MD as Consulting Physician (Interventional Radiology) MUnk Pinto MD as Referring Physician (Internal Medicine) Dohmeier, CAsencion Partridge MD as Consulting Physician (Neurology)  Medical History:  Past Medical History:  Diagnosis Date   Anemia    Arthritis    Chronic diastolic heart failure ([place]    GERD (gastroesophageal reflux disease)    Heart murmur    Hyperlipidemia    Hypertension may, 1973   Mitral regurgitation    a. Echo 01/2012 mild LVH, EF 55-65%, Gr 2 DD, mod MR, mild LAE, PASP 36;  b. Echo (02/2013):  EF 55-60%, Gr 1 DD, mild to mod MR, mild LAE (no sig change since prior echo) // c. Echo 10/18: EF 55-60, normal wall motion, grade 2 diastolic dysfunction, trivial AI, mild  to moderate MR, mild LAE, trivial PI, PASP 38     OSA on CPAP    7 yrs   [place]   Stroke ([place] 07   no weakness jan 29th 2019 no defecits   Subarachnoid hemorrhage due to ruptured aneurysm ([place]    2003 - s/p coiling   Unspecified vitamin D deficiency    Allergies Allergies  Allergen Reactions   Promethazine-Dm     Palpitations   Ace Inhibitors Nausea And Vomiting   Aspirin Nausea And Vomiting    Can take coated Aspirin   SURGICAL HISTORY She  has a past surgical history that includes Aneurysm coiling; head surgery; Tonsillectomy and adenoidectomy; Radiology with anesthesia (N/A, 04/23/2013); IR 3D Independent Wkst (09/04/2016); IR Angiogram Extremity Left (09/04/2016); IR ANGIO INTRA EXTRACRAN SEL COM CAROTID INNOMINATE BILAT MOD SED (09/04/2016); IR ANGIO VERTEBRAL SEL VERTEBRAL BILAT MOD SED (09/04/2016); IR Radiologist Eval & Mgmt (10/11/2016); Radiology with anesthesia (N/A, 10/30/2016); Tubal ligation; Radiology with anesthesia (N/A, 02/21/2017); IR Transcath/Emboliz (02/21/2017); IR ANGIO INTRA EXTRACRAN SEL INTERNAL CAROTID UNI L MOD SED (02/21/2017); IR NEURO EACH ADD'L AFTER BASIC UNI LEFT (MS) (02/21/2017); IR Angiogram Follow Up Study (02/21/2017); IR Radiologist Eval & Mgmt (03/07/2017); Abdominal hysterectomy; Esophagogastroduodenoscopy (egd) with propofol (N/A, 09/05/2018); Colonoscopy with propofol (N/A, 09/05/2018); and Givens capsule study (N/A, 10/17/2018).   FAMILY HISTORY Her family history includes Cancer (age of onset: 642 in her sister; Hypertension in her father, sister, sister, and sister; Kidney disease in her father.   SOCIAL HISTORY She  reports that she quit smoking about 23 years ago. Her smoking use included cigarettes. She has a 0.75 pack-year  smoking history. She has never used smokeless tobacco. She reports that she does not drink alcohol and does not use drugs.  Review of Systems: Review of Systems  Constitutional:  Negative for malaise/fatigue and  weight loss.  HENT:  Negative for hearing loss and tinnitus.   Eyes:  Negative for blurred vision and double vision.  Respiratory:  Positive for cough. Negative for sputum production, shortness of breath and wheezing.   Cardiovascular:  Negative for chest pain, palpitations, orthopnea, claudication and leg swelling.  Gastrointestinal:  Negative for abdominal pain, blood in stool, constipation, diarrhea, heartburn, melena, nausea and vomiting.  Genitourinary: Negative.   Musculoskeletal:  Negative for joint pain and myalgias.  Skin:  Negative for rash.  Neurological:  Negative for dizziness, tingling, sensory change, weakness and headaches.  Endo/Heme/Allergies:  Negative for polydipsia.  Psychiatric/Behavioral: Negative.    All other systems reviewed and are negative.   Physical Exam: Estimated body mass index is 29.93 kg/m as calculated from the following:   Height as of this encounter: '5\' 10"'$  (1.778 m).   Weight as of this encounter: 208 lb 9.6 oz (94.6 kg). BP (!) 140/80   Pulse 72   Temp 97.7 F (36.5 C)   Ht '5\' 10"'$  (1.778 m)   Wt 208 lb 9.6 oz (94.6 kg)   SpO2 99%   BMI 29.93 kg/m  General Appearance: Well nourished, in no apparent distress.  Eyes: PERRLA, EOMs, conjunctiva no swelling or erythema Sinuses: No Frontal/maxillary tenderness  ENT/Mouth: Ext aud canals clear, normal light reflex with TMs without erythema, bulging.  No erythema, swelling, or exudate on post pharynx. Tonsils not swollen or erythematous. Hearing normal.  Neck: Supple, thyroid subtly enlarged, no nodules. No bruits  Respiratory: Respiratory effort normal, BS equal bilaterally without rales, rhonchi, wheezing or stridor.  Cardio: RRR with holosystolic murmur LSB, without rubs or gallops. Brisk peripheral pulses with 2+ edema.  Chest: symmetric, with normal excursions and percussion.  Breasts: Defer, getting annual mammogram, no concerns Abdomen: Soft, nontender, no guarding, rebound, hernias, masses,  or organomegaly.  Lymphatics: Non tender without lymphadenopathy.  Genitourinary: defer Musculoskeletal: Full ROM all peripheral extremities,5/5 strength, and antalgic gait.  Skin: Warm, dry without rashes, lesions, ecchymosis. Neuro: Cranial nerves intact, reflexes equal bilaterally. Normal muscle tone, no cerebellar symptoms. Sensation intact.  Psych: Awake and oriented X 3, normal affect, Insight and Judgment appropriate.   EKG:  NSR AAA [place]:   <3 cm   Story Conti, DNP, AGNP-C 2:17 PM [facility]

## 2022-04-06 LAB — COMPLETE METABOLIC PANEL WITH GFR
AG Ratio: 1.3 (calc) (ref 1.0–2.5)
ALT: 7 U/L (ref 6–29)
AST: 16 U/L (ref 10–35)
Albumin: 4.6 g/dL (ref 3.6–5.1)
Alkaline phosphatase (APISO): 65 U/L (ref 37–153)
BUN/Creatinine Ratio: 13 (calc) (ref 6–22)
BUN: 60 mg/dL — ABNORMAL HIGH (ref 7–25)
CO2: 18 mmol/L — ABNORMAL LOW (ref 20–32)
Calcium: 9.6 mg/dL (ref 8.6–10.4)
Chloride: 108 mmol/L (ref 98–110)
Creat: 4.56 mg/dL — ABNORMAL HIGH (ref 0.50–1.05)
Globulin: 3.6 g/dL (calc) (ref 1.9–3.7)
Glucose, Bld: 86 mg/dL (ref 65–99)
Potassium: 5.9 mmol/L — ABNORMAL HIGH (ref 3.5–5.3)
Sodium: 135 mmol/L (ref 135–146)
Total Bilirubin: 0.3 mg/dL (ref 0.2–1.2)
Total Protein: 8.2 g/dL — ABNORMAL HIGH (ref 6.1–8.1)
eGFR: 10 mL/min/{1.73_m2} — ABNORMAL LOW (ref >=60)

## 2022-04-06 LAB — URINALYSIS, ROUTINE W REFLEX MICROSCOPIC
Bacteria, UA: NONE SEEN /HPF
Bilirubin Urine: NEGATIVE
Glucose, UA: NEGATIVE
Hyaline Cast: NONE SEEN /LPF
Ketones, ur: NEGATIVE
Leukocytes,Ua: NEGATIVE
Nitrite: NEGATIVE
Specific Gravity, Urine: 1.012 (ref 1.001–1.035)
Squamous Epithelial / HPF: NONE SEEN /HPF (ref <=5)
WBC, UA: NONE SEEN /HPF (ref 0–5)
pH: <=5 (ref 5.0–8.0)

## 2022-04-06 LAB — LIPID PANEL
Cholesterol: 160 mg/dL (ref <200)
HDL: 80 mg/dL (ref >=50)
LDL Cholesterol (Calc): 64 mg/dL (calc)
Non-HDL Cholesterol (Calc): 80 mg/dL (calc) (ref <130)
Total CHOL/HDL Ratio: 2 (calc) (ref <5.0)
Triglycerides: 75 mg/dL (ref <150)

## 2022-04-06 LAB — CBC WITH DIFFERENTIAL/PLATELET
Absolute Monocytes: 456 cells/uL (ref 200–950)
Basophils Absolute: 47 cells/uL (ref 0–200)
Basophils Relative: 0.7 %
Eosinophils Absolute: 168 cells/uL (ref 15–500)
Eosinophils Relative: 2.5 %
HCT: 27.6 % — ABNORMAL LOW (ref 35.0–45.0)
Hemoglobin: 8.8 g/dL — ABNORMAL LOW (ref 11.7–15.5)
Lymphs Abs: 2452 cells/uL (ref 850–3900)
MCH: 26.3 pg — ABNORMAL LOW (ref 27.0–33.0)
MCHC: 31.9 g/dL — ABNORMAL LOW (ref 32.0–36.0)
MCV: 82.4 fL (ref 80.0–100.0)
MPV: 10 fL (ref 7.5–12.5)
Monocytes Relative: 6.8 %
Neutro Abs: 3578 cells/uL (ref 1500–7800)
Neutrophils Relative %: 53.4 %
Platelets: 308 10*3/uL (ref 140–400)
RBC: 3.35 10*6/uL — ABNORMAL LOW (ref 3.80–5.10)
RDW: 16 % — ABNORMAL HIGH (ref 11.0–15.0)
Total Lymphocyte: 36.6 %
WBC: 6.7 10*3/uL (ref 3.8–10.8)

## 2022-04-06 LAB — URIC ACID: Uric Acid, Serum: 7.6 mg/dL — ABNORMAL HIGH (ref 2.5–7.0)

## 2022-04-06 LAB — IRON,TIBC AND FERRITIN PANEL
%SAT: 17 % (calc) (ref 16–45)
Ferritin: 113 ng/mL (ref 16–288)
Iron: 48 ug/dL (ref 45–160)
TIBC: 287 mcg/dL (calc) (ref 250–450)

## 2022-04-06 LAB — HEMOGLOBIN A1C
Hgb A1c MFr Bld: 5.6 % of total Hgb (ref <5.7)
Mean Plasma Glucose: 114 mg/dL
eAG (mmol/L): 6.3 mmol/L

## 2022-04-06 LAB — VITAMIN D 25 HYDROXY (VIT D DEFICIENCY, FRACTURES): Vit D, 25-Hydroxy: 46 ng/mL (ref 30–100)

## 2022-04-06 LAB — MICROALBUMIN / CREATININE URINE RATIO
Creatinine, Urine: 134 mg/dL (ref 20–275)
Microalb Creat Ratio: 75 mcg/mg creat — ABNORMAL HIGH (ref <30)
Microalb, Ur: 10 mg/dL

## 2022-04-06 LAB — INSULIN, RANDOM: Insulin: 9.6 u[IU]/mL

## 2022-04-06 LAB — MAGNESIUM: Magnesium: 1.3 mg/dL — ABNORMAL LOW (ref 1.5–2.5)

## 2022-04-06 LAB — MICROSCOPIC MESSAGE

## 2022-04-06 LAB — TSH: TSH: 1.6 mIU/L (ref 0.40–4.50)

## 2022-04-06 LAB — LEVETIRACETAM, IMMUNOASSAY: LEVETIRACETAM, IMMUNOASSAY: 41.7 ug/mL (ref 6.0–46.0)

## 2022-04-10 ENCOUNTER — Encounter: Payer: Self-pay | Admitting: Internal Medicine

## 2022-04-10 NOTE — Progress Notes (Signed)
Patient is aware of lab results and instructions. -e welch

## 2022-04-12 ENCOUNTER — Telehealth: Payer: Self-pay | Admitting: Internal Medicine

## 2022-04-12 DIAGNOSIS — D631 Anemia in chronic kidney disease: Secondary | ICD-10-CM | POA: Diagnosis not present

## 2022-04-12 DIAGNOSIS — N1832 Chronic kidney disease, stage 3b: Secondary | ICD-10-CM | POA: Diagnosis not present

## 2022-04-12 DIAGNOSIS — N390 Urinary tract infection, site not specified: Secondary | ICD-10-CM | POA: Diagnosis not present

## 2022-04-12 DIAGNOSIS — N179 Acute kidney failure, unspecified: Secondary | ICD-10-CM | POA: Diagnosis not present

## 2022-04-12 DIAGNOSIS — I129 Hypertensive chronic kidney disease with stage 1 through stage 4 chronic kidney disease, or unspecified chronic kidney disease: Secondary | ICD-10-CM | POA: Diagnosis not present

## 2022-04-12 DIAGNOSIS — N2581 Secondary hyperparathyroidism of renal origin: Secondary | ICD-10-CM | POA: Diagnosis not present

## 2022-04-12 NOTE — Progress Notes (Signed)
  Chronic Care Management   Outreach Note  04/12/2022 Name: Yolanda Espinoza MRN: [id number] DOB: 07-26-1955  Referred by: Unk Pinto, MD Reason for referral : No chief complaint on file.   An unsuccessful telephone outreach was attempted today. The patient was referred to the pharmacist for assistance with care management and care coordination.   Follow Up Plan:   Tatjana Dellinger Upstream Scheduler

## 2022-04-14 ENCOUNTER — Other Ambulatory Visit: Payer: Self-pay | Admitting: Nephrology

## 2022-04-14 DIAGNOSIS — N1832 Chronic kidney disease, stage 3b: Secondary | ICD-10-CM

## 2022-04-14 DIAGNOSIS — N179 Acute kidney failure, unspecified: Secondary | ICD-10-CM

## 2022-04-17 ENCOUNTER — Encounter (HOSPITAL_COMMUNITY): Payer: Medicare HMO

## 2022-04-17 ENCOUNTER — Other Ambulatory Visit: Payer: Self-pay | Admitting: Nurse Practitioner

## 2022-04-17 DIAGNOSIS — M791 Myalgia, unspecified site: Secondary | ICD-10-CM

## 2022-04-17 MED ORDER — CYCLOBENZAPRINE HCL 10 MG PO TABS: ORAL_TABLET | ORAL | 3 refills | Status: DC

## 2022-04-17 NOTE — Addendum Note (Signed)
Addended by: Chancy Hurter on: 04/17/2022 09:28 AM   Modules accepted: Orders

## 2022-04-24 ENCOUNTER — Telehealth: Payer: Self-pay | Admitting: Cardiovascular Disease

## 2022-04-24 DIAGNOSIS — N1832 Chronic kidney disease, stage 3b: Secondary | ICD-10-CM | POA: Diagnosis not present

## 2022-04-24 LAB — LAB REPORT - SCANNED: EGFR: 18

## 2022-04-24 NOTE — Telephone Encounter (Signed)
Patient was calling back. Please advise  

## 2022-04-24 NOTE — Telephone Encounter (Signed)
Pt this pt called back to sch echo. She states she needs a Wednesday but there are no openings before 05/14/22 when the order expires. Can this order be extended please.

## 2022-04-26 ENCOUNTER — Emergency Department (HOSPITAL_COMMUNITY): Payer: Medicare PPO

## 2022-04-26 ENCOUNTER — Other Ambulatory Visit: Payer: Self-pay

## 2022-04-26 ENCOUNTER — Encounter (HOSPITAL_COMMUNITY): Payer: Self-pay

## 2022-04-26 ENCOUNTER — Emergency Department (HOSPITAL_COMMUNITY)
Admission: EM | Admit: 2022-04-26 | Discharge: 2022-04-26 | Disposition: A | Discharge status: Home / Self Care | Payer: Medicare PPO | Attending: Emergency Medicine | Admitting: Emergency Medicine

## 2022-04-26 DIAGNOSIS — E875 Hyperkalemia: Secondary | ICD-10-CM | POA: Diagnosis not present

## 2022-04-26 DIAGNOSIS — I509 Heart failure, unspecified: Secondary | ICD-10-CM | POA: Insufficient documentation

## 2022-04-26 DIAGNOSIS — Z79899 Other long term (current) drug therapy: Secondary | ICD-10-CM | POA: Diagnosis not present

## 2022-04-26 DIAGNOSIS — R079 Chest pain, unspecified: Secondary | ICD-10-CM | POA: Diagnosis not present

## 2022-04-26 DIAGNOSIS — N189 Chronic kidney disease, unspecified: Secondary | ICD-10-CM | POA: Diagnosis not present

## 2022-04-26 DIAGNOSIS — I130 Hypertensive heart and chronic kidney disease with heart failure and stage 1 through stage 4 chronic kidney disease, or unspecified chronic kidney disease: Secondary | ICD-10-CM | POA: Diagnosis not present

## 2022-04-26 DIAGNOSIS — I129 Hypertensive chronic kidney disease with stage 1 through stage 4 chronic kidney disease, or unspecified chronic kidney disease: Secondary | ICD-10-CM

## 2022-04-26 DIAGNOSIS — Z7982 Long term (current) use of aspirin: Secondary | ICD-10-CM | POA: Insufficient documentation

## 2022-04-26 LAB — CBC
HCT: 28.5 % — ABNORMAL LOW (ref 36.0–46.0)
Hemoglobin: 8.6 g/dL — ABNORMAL LOW (ref 12.0–15.0)
MCH: 26.5 pg (ref 26.0–34.0)
MCHC: 30.2 g/dL (ref 30.0–36.0)
MCV: 88 fL (ref 80.0–100.0)
Platelets: 303 10*3/uL (ref 150–400)
RBC: 3.24 MIL/uL — ABNORMAL LOW (ref 3.87–5.11)
RDW: 15.8 % — ABNORMAL HIGH (ref 11.5–15.5)
WBC: 6.7 10*3/uL (ref 4.0–10.5)
nRBC: 0 % (ref 0.0–0.2)

## 2022-04-26 LAB — BASIC METABOLIC PANEL
Anion gap: 13 (ref 5–15)
BUN: 43 mg/dL — ABNORMAL HIGH (ref 8–23)
CO2: 20 mmol/L — ABNORMAL LOW (ref 22–32)
Calcium: 10 mg/dL (ref 8.9–10.3)
Chloride: 103 mmol/L (ref 98–111)
Creatinine, Ser: 2.67 mg/dL — ABNORMAL HIGH (ref 0.44–1.00)
GFR, Estimated: 19 mL/min — ABNORMAL LOW (ref >60)
Glucose, Bld: 91 mg/dL (ref 70–99)
Potassium: 5.6 mmol/L — ABNORMAL HIGH (ref 3.5–5.1)
Sodium: 136 mmol/L (ref 135–145)

## 2022-04-26 LAB — TROPONIN I (HIGH SENSITIVITY): Troponin I (High Sensitivity): 9 ng/L (ref <18)

## 2022-04-26 MED ORDER — LOKELMA 10 G PO PACK
10.0000 g | PACK | Freq: Every day | ORAL | 0 refills | Status: AC
Start: 2022-04-26 — End: 2022-04-29

## 2022-04-26 MED ORDER — SODIUM ZIRCONIUM CYCLOSILICATE 10 G PO PACK
10.0000 g | PACK | Freq: Once | ORAL | Status: AC
  Administered 2022-04-26: 10 g via ORAL
  Filled 2022-04-26: qty 1

## 2022-04-26 MED ORDER — FUROSEMIDE 10 MG/ML IJ SOLN
40.0000 mg | Freq: Once | INTRAMUSCULAR | Status: AC
  Administered 2022-04-26: 40 mg via INTRAVENOUS
  Filled 2022-04-26: qty 4

## 2022-04-26 MED ORDER — SODIUM CHLORIDE 0.9 % IV BOLUS
1000.0000 mL | Freq: Once | INTRAVENOUS | Status: AC
  Administered 2022-04-26: 1000 mL via INTRAVENOUS

## 2022-04-26 NOTE — Discharge Instructions (Addendum)
Your potassium today was 5.6.  This is above normal range.  Do not take your Benicar.   You can take 10 mg of amlodipine daily for management of your blood pressure.  You received fluids, Lasix, and Lokelma in the emergency department.  This will help lower your potassium.  A prescription for continued Lokelma for the next 3 days was sent to your pharmacy.  Please obtain repeat lab work in the next 2 days to ensure that your potassium has returned to normal.  Return to the emergency department for any further concerns.

## 2022-04-26 NOTE — ED Notes (Signed)
Pt transported to XR upon arrival to room 23

## 2022-04-26 NOTE — ED Notes (Incomplete)
Patient transported to X-ray 

## 2022-04-26 NOTE — ED Provider Notes (Signed)
[facility] Provider Note   CSN: [id number] Arrival date & time: 04/26/22  1044     History  Chief Complaint  Patient presents with   Hyperkalemia    Yolanda Espinoza is a 67 y.o. female.  HPI Patient presents for concern of hyperkalemia.  Medical history includes HTN, CHF, OSA, HLD, gout, GERD, anemia, CKD, seizures, arthritis, CVA.  She recently underwent routine outpatient lab work.  She was informed that her potassium was 6.8.  Per EMR, her medications include chlorthalidone, olmesartan.  She states that she was taken off of the olmesartan.  She has not had any new recent symptoms.  She has had prior episodes of hyperkalemia.    Home Medications Prior to Admission medications   Medication Sig Start Date End Date Taking? Authorizing Provider  allopurinol (ZYLOPRIM) 100 MG tablet TAKE 1 TABLET DAILY TO PREVENT GOUT 01/18/22   Alycia Rossetti, NP  amLODipine (NORVASC) 5 MG tablet TAKE 1 TABLET EVERY DAY 04/17/22   Alycia Rossetti, NP  aspirin EC 81 MG tablet Take 1 tablet (81 mg total) by mouth daily. 03/11/15   Nahser, Wonda Cheng, MD  chlorthalidone (HYGROTON) 25 MG tablet Take 1 tablet (25 mg total) by mouth daily. 09/19/21   Elgergawy, Silver Huguenin, MD  cholecalciferol (VITAMIN D3) 25 MCG (1000 UT) tablet Take 1,000 Units by mouth daily.    [provider]  cyclobenzaprine (FLEXERIL) 10 MG tablet Take 1 tablet at Bedtime as Needed for Muscle Spasms 04/17/22   Darrol Jump, NP  famotidine (PEPCID) 20 MG tablet TAKE 1 TABLET BY MOUTH DAILY AT 9 AM EVERY MORNING, 30 TO 60 MINUTES PRIOR TO BREAKFAST FOR REFLUX. 03/27/22   Alycia Rossetti, NP  fexofenadine [facility] ALLERGY) 180 MG tablet Take 1 tablet (180 mg total) by mouth daily. 12/26/19   Liane Comber, NP  levETIRAcetam (KEPPRA) 1000 MG tablet Take one tablet (1000mg ) daily. 10/05/21   Darrol Jump, NP  Multiple Vitamins-Minerals (MULTIVITAMIN WITH MINERALS) tablet Take 1 tablet  by mouth daily.    [provider]  olmesartan (BENICAR) 40 MG tablet Take 1 tablet  Daily  for BP 01/19/22   Unk Pinto, MD  pantoprazole (PROTONIX) 40 MG tablet Take one tablet at bedtime 01/11/22   Darrol Jump, NP  rosuvastatin (CRESTOR) 5 MG tablet Take 1 tab three days a week (MWF) for cholesterol. 06/02/21   Liane Comber, NP  sodium zirconium cyclosilicate (LOKELMA) 10 g PACK packet Take 10 g by mouth daily for 3 days. 04/26/22 04/29/22  Godfrey Pick, MD  ticagrelor (BRILINTA) 90 MG TABS tablet Take 1 tablet  2 x /day (every 12 hours )  to prevent Blood Clots Patient taking differently: Take one tablet daily for blood clots 10/12/20   Liane Comber, NP      Allergies    Promethazine-dm, Ace inhibitors, and Aspirin    Review of Systems   Review of Systems  Respiratory:  Negative for shortness of breath.   Cardiovascular:  Negative for chest pain.  Gastrointestinal:  Negative for nausea.  Neurological:  Negative for weakness.  All other systems reviewed and are negative.   Physical Exam Updated Vital Signs BP (!) 180/95   Pulse 80   Temp 97.6 F (36.4 C) (Oral)   Resp 15   SpO2 100%  Physical Exam Vitals and nursing note reviewed.  Constitutional:      General: She is not in acute distress.    Appearance: Normal  appearance. She is well-developed. She is not ill-appearing, toxic-appearing or diaphoretic.  HENT:     Head: Normocephalic and atraumatic.     Right Ear: External ear normal.     Left Ear: External ear normal.     Nose: Nose normal.     Mouth/Throat:     Mouth: Mucous membranes are moist.  Eyes:     Extraocular Movements: Extraocular movements intact.     Conjunctiva/sclera: Conjunctivae normal.  Cardiovascular:     Rate and Rhythm: Normal rate and regular rhythm.  Pulmonary:     Effort: Pulmonary effort is normal. No respiratory distress.  Abdominal:     General: There is no distension.     Palpations: Abdomen is soft.   Musculoskeletal:        General: No swelling. Normal range of motion.     Cervical back: Normal range of motion and neck supple.     Right lower leg: No edema.     Left lower leg: No edema.  Skin:    General: Skin is warm and dry.     Coloration: Skin is not jaundiced or pale.  Neurological:     General: No focal deficit present.     Mental Status: She is alert and oriented to person, place, and time.     Cranial Nerves: No cranial nerve deficit.     Sensory: No sensory deficit.     Motor: No weakness.     Coordination: Coordination normal.  Psychiatric:        Mood and Affect: Mood normal.        Behavior: Behavior normal.        Thought Content: Thought content normal.        Judgment: Judgment normal.     ED Results / Procedures / Treatments   Labs (all labs ordered are listed, but only abnormal results are displayed) Labs Reviewed  BASIC METABOLIC PANEL - Abnormal; Notable for the following components:      Result Value   Potassium 5.6 (*)    CO2 20 (*)    BUN 43 (*)    Creatinine, Ser 2.67 (*)    GFR, Estimated 19 (*)    All other components within normal limits  CBC - Abnormal; Notable for the following components:   RBC 3.24 (*)    Hemoglobin 8.6 (*)    HCT 28.5 (*)    RDW 15.8 (*)    All other components within normal limits  TROPONIN I (HIGH SENSITIVITY)    EKG EKG Interpretation  Date/Time:  Wednesday April 26 2022 10:54:41 EDT Ventricular Rate:  84 PR Interval:  154 QRS Duration: 82 QT Interval:  344 QTC Calculation: 406 R Axis:   24 Text Interpretation: Normal sinus rhythm Anteroseptal infarct , age undetermined Confirmed by Godfrey Pick [id number]) on 04/26/2022 11:25:15 AM  Radiology DG Chest 2 View  Result Date: 04/26/2022 CLINICAL DATA:  Provided history: Chest pain. EXAM: CHEST - 2 VIEW COMPARISON:  Prior chest radiographs 02/22/2017 and earlier. FINDINGS: Heart size within normal limits. No appreciable airspace consolidation. No evidence of pleural  effusion or pneumothorax. No acute osseous abnormality identified. Degenerative changes of the spine. IMPRESSION: No evidence of active cardiopulmonary disease. Electronically Signed   By: Kellie Simmering D.O.   On: 04/26/2022 11:47    Procedures Procedures    Medications Ordered in ED Medications  sodium chloride 0.9 % bolus 1,000 mL (1,000 mLs Intravenous New Bag/Given 04/26/22 1319)  sodium zirconium cyclosilicate (LOKELMA) packet  10 g (10 g Oral Given 04/26/22 1320)  furosemide (LASIX) injection 40 mg (40 mg Intravenous Given 04/26/22 1320)    ED Course/ Medical Decision Making/ A&P                             Medical Decision Making Amount and/or Complexity of Data Reviewed Labs: ordered. Radiology: ordered.  Risk Prescription drug management.   This patient presents to the ED for concern of hyperkalemia, this involves an extensive number of treatment options, and is a complaint that carries with it a high risk of complications and morbidity.  The differential diagnosis includes CKD effects, polypharmacy, laboratory air   Co morbidities that complicate the patient evaluation  HTN, CHF, OSA, HLD, gout, GERD, anemia, CKD, seizures, arthritis, CVA   Additional history obtained:  Additional history obtained from N/A External records from outside source obtained and reviewed including EMR   Lab Tests:  I Ordered, and personally interpreted labs.  The pertinent results include: Potassium today is 5.6.  Creatinine appears improved from baseline.  Anemia is baseline.  No leukocytosis is present.   Imaging Studies ordered:  I ordered imaging studies including chest x-ray I independently visualized and interpreted imaging which showed no acute findings I agree with the radiologist interpretation   Cardiac Monitoring: / EKG:  The patient was maintained on a cardiac monitor.  I personally viewed and interpreted the cardiac monitored which showed an underlying rhythm of: Sinus  rhythm   Problem List / ED Course / Critical interventions / Medication management  Patient presents for concern of hyperkalemia.  This was identified on outpatient routine lab work.  She was advised to come to the ED for further evaluation.  On arrival, vital signs are notable for hypertension.  Patient is well-appearing on exam.  She has no recent physical symptoms.  EKG shows narrow QRS with no peaked T waves.  Lab work shows improved hyperkalemia.  Potassium today is 5.6.  Patient was given IV fluids, Lasix, and Lokelma in the ED.  She does state that she will be able to obtain lab work within the next 2 days.  She was prescribed ongoing Lokelma for the next 3 days.  I discussed her home medications with her.  She states that she is no longer taking the Benicar.  Patient was discharged in stable condition. I ordered medication including IV fluids, Lokelma, Lasix for hyperkalemia Reevaluation of the patient after these medicines showed that the patient improved I have reviewed the patients home medicines and have made adjustments as needed   Social Determinants of Health:  Has access to outpatient care        Final Clinical Impression(s) / ED Diagnoses Final diagnoses:  Hyperkalemia    Rx / DC Orders ED Discharge Orders          Ordered    sodium zirconium cyclosilicate (LOKELMA) 10 g PACK packet  Daily        04/26/22 1412              Godfrey Pick, MD 04/26/22 1415

## 2022-04-26 NOTE — ED Triage Notes (Signed)
Pt came in via POV d/t PCP recommendation after her routine blood work yesterday resulted a potassium of 6.8 (per pt). She endorse having chronic kidney issues, NOT a dialysis pt, & denies any s/s of discomfort/distress, no pain.

## 2022-04-28 ENCOUNTER — Ambulatory Visit (HOSPITAL_COMMUNITY)
Admission: RE | Admit: 2022-04-28 | Discharge: 2022-04-28 | Disposition: A | Discharge status: Home / Self Care | Payer: Medicare PPO | Source: Ambulatory Visit | Attending: Nephrology | Admitting: Nephrology

## 2022-04-28 VITALS — BP 155/95 | HR 76 | Temp 97.7°F | Resp 18

## 2022-04-28 DIAGNOSIS — D638 Anemia in other chronic diseases classified elsewhere: Secondary | ICD-10-CM | POA: Diagnosis not present

## 2022-04-28 DIAGNOSIS — D631 Anemia in chronic kidney disease: Secondary | ICD-10-CM | POA: Insufficient documentation

## 2022-04-28 DIAGNOSIS — N184 Chronic kidney disease, stage 4 (severe): Secondary | ICD-10-CM | POA: Insufficient documentation

## 2022-04-28 LAB — IRON AND TIBC
Iron: 43 ug/dL (ref 28–170)
Saturation Ratios: 12 % (ref 10.4–31.8)
TIBC: 346 ug/dL (ref 250–450)
UIBC: 303 ug/dL

## 2022-04-28 LAB — POCT HEMOGLOBIN-HEMACUE: Hemoglobin: 8.1 g/dL — ABNORMAL LOW (ref 12.0–15.0)

## 2022-04-28 LAB — FERRITIN: Ferritin: 99 ng/mL (ref 11–307)

## 2022-04-28 MED ORDER — EPOETIN ALFA-EPBX 10000 UNIT/ML IJ SOLN
20000.0000 [IU] | INTRAMUSCULAR | Status: DC
  Administered 2022-04-28: 20000 [IU] via SUBCUTANEOUS

## 2022-04-28 MED ORDER — EPOETIN ALFA-EPBX 10000 UNIT/ML IJ SOLN
INTRAMUSCULAR | Status: AC
  Filled 2022-04-28: qty 2

## 2022-05-01 ENCOUNTER — Telehealth: Payer: Self-pay

## 2022-05-01 NOTE — Telephone Encounter (Signed)
        Patient  visited [place] on 4/3   Telephone encounter attempt : 2ND    Unable to leave a message     Lenard Forth [facility] Guide, [facility] [phone] [address] [place], [place], [place] [address] Phone: [phone] Email: [email or web address]

## 2022-05-01 NOTE — Telephone Encounter (Signed)
        Patient  visited [place] on 4/3     Telephone encounter attempt :  1st  A HIPAA compliant voice message was left requesting a return call.  Instructed patient to call back     Ronelle Michie [facility] Guide, [place] [phone] [address], [place], [place] [address] Phone: [phone] Email: [email or web address]       

## 2022-05-02 ENCOUNTER — Telehealth: Payer: Self-pay | Admitting: Internal Medicine

## 2022-05-02 NOTE — Progress Notes (Signed)
  Chronic Care Management   Note  05/02/2022 Name: CARMINA SIEMEN MRN: [id number] DOB: 08/23/1955  CAELYNN LEFFLER is a 67 y.o. year old female who is a primary care patient of Lucky Cowboy, MD. I reached out to Beacher May by phone today in response to a referral sent by Ms. Sunday Shams PCP, Lucky Cowboy, MD.   Ms. Wolinski was given information about Chronic Care Management services today including:  CCM service includes personalized support from designated clinical staff supervised by her physician, including individualized plan of care and coordination with other care providers 24/7 contact phone numbers for assistance for urgent and routine care needs. Service will only be billed when office clinical staff spend 20 minutes or more in a month to coordinate care. Only one practitioner may furnish and bill the service in a calendar month. The patient may stop CCM services at any time (effective at the end of the month) by phone call to the office staff.   Patient agreed to services and verbal consent obtained.   Follow up plan:   [employer] manager, [employer] food

## 2022-05-02 NOTE — Progress Notes (Signed)
  Chronic Care Management   Outreach Note  05/02/2022 Name: Yolanda Espinoza MRN: [id number] DOB: [birth year]  Referred by: Lucky Cowboy, MD Reason for referral : No chief complaint on file.   Third unsuccessful telephone outreach was attempted today. The patient was referred to the pharmacist for assistance with care management and care coordination.   Follow Up Plan:   Tatjana Dellinger [employer] Scheduler

## 2022-05-06 ENCOUNTER — Encounter (HOSPITAL_COMMUNITY): Payer: Self-pay

## 2022-05-10 ENCOUNTER — Ambulatory Visit (HOSPITAL_COMMUNITY): Payer: Medicare HMO | Attending: Cardiovascular Disease

## 2022-05-10 DIAGNOSIS — I340 Nonrheumatic mitral (valve) insufficiency: Secondary | ICD-10-CM | POA: Insufficient documentation

## 2022-05-10 LAB — ECHOCARDIOGRAM COMPLETE
Area-P 1/2: 3.07 cm2
Est EF: 55
P 1/2 time: 526 msec
S' Lateral: 3.6 cm

## 2022-05-16 ENCOUNTER — Ambulatory Visit
Admission: RE | Admit: 2022-05-16 | Discharge: 2022-05-16 | Disposition: A | Discharge status: Home / Self Care | Payer: Medicare HMO | Source: Ambulatory Visit | Attending: Nephrology

## 2022-05-16 ENCOUNTER — Ambulatory Visit: Payer: Medicare HMO | Attending: Physician Assistant | Admitting: Physician Assistant

## 2022-05-16 DIAGNOSIS — N1832 Chronic kidney disease, stage 3b: Secondary | ICD-10-CM

## 2022-05-16 DIAGNOSIS — N281 Cyst of kidney, acquired: Secondary | ICD-10-CM | POA: Diagnosis not present

## 2022-05-16 DIAGNOSIS — N179 Acute kidney failure, unspecified: Secondary | ICD-10-CM

## 2022-05-16 NOTE — Progress Notes (Deleted)
  Cardiology Office Note:    Date:  05/16/2022  ID:  Yolanda Espinoza, DOB [birth year], MRN [id number] PCP: Lucky Cowboy, MD   HeartCare Providers Cardiologist:  Kristeen Miss, MD { Click to update primary MD,subspecialty MD or APP then REFRESH:1}    {Click to Open Review  :1}   Patient Profile:   (HFpEF) heart failure with preserved ejection fraction  Mitral regurgitation TTE 11/01/16: mild to mod MR  TTE 08/20/19: Mod MR Cardiac MRI 10/02/19: no RA or LA mass, EF 56, mod MR TTE 05/10/22: EF 55, no RWMA, Gr 1 DD, NL RVSF, NL PASP, RVSP 31.3, mild LAE, mod MR, trivial AI, RAP 3  Hypertension  Hyperlipidemia  Chronic kidney disease IV Hx of subarachnoid hemorrhage 2/2 aneurysm s/p coiling in 2003 Hx of CVAs OSA AAA [place] 06/22/21: no AAA (2 cm)    History of Present Illness:   KASHENA NOVITSKI is a 67 y.o. female who returns for f/u of mitral regurgitation, CHF. She was seen by Dr. Elease Hashimoto 05/13/21. She had a f/u echocardiogram 05/10/22 which demonstrated normal EF and mod mitral regurgitation. ***  ROS ***    Studies Reviewed:    EKG:  ***  *** Risk Assessment/Calculations:   {Does this patient have ATRIAL FIBRILLATION?:[id number]} No BP recorded.  {Refresh Note OR Click here to enter BP  :1}***       Physical Exam:   VS:  There were no vitals taken for this visit.   Wt Readings from Last 3 Encounters:  04/03/22 208 lb 9.6 oz (94.6 kg)  01/04/22 221 lb 9.6 oz (100.5 kg)  12/28/21 225 lb (102.1 kg)    Physical Exam***    ASSESSMENT AND PLAN:   No problem-specific Assessment & Plan notes found for this encounter. ***    {Are you ordering a CV Procedure (e.g. stress test, cath, DCCV, TEE, etc)?   Press F2        :[id number]}  Dispo:  No follow-ups on file.  Signed, Tereso Newcomer, PA-C

## 2022-05-17 ENCOUNTER — Other Ambulatory Visit: Payer: Self-pay

## 2022-05-17 DIAGNOSIS — M791 Myalgia, unspecified site: Secondary | ICD-10-CM

## 2022-05-17 DIAGNOSIS — I129 Hypertensive chronic kidney disease with stage 1 through stage 4 chronic kidney disease, or unspecified chronic kidney disease: Secondary | ICD-10-CM | POA: Diagnosis not present

## 2022-05-17 DIAGNOSIS — D631 Anemia in chronic kidney disease: Secondary | ICD-10-CM | POA: Diagnosis not present

## 2022-05-17 DIAGNOSIS — N184 Chronic kidney disease, stage 4 (severe): Secondary | ICD-10-CM | POA: Diagnosis not present

## 2022-05-17 DIAGNOSIS — N2581 Secondary hyperparathyroidism of renal origin: Secondary | ICD-10-CM | POA: Diagnosis not present

## 2022-05-17 DIAGNOSIS — N179 Acute kidney failure, unspecified: Secondary | ICD-10-CM | POA: Diagnosis not present

## 2022-05-17 DIAGNOSIS — N189 Chronic kidney disease, unspecified: Secondary | ICD-10-CM | POA: Diagnosis not present

## 2022-05-17 DIAGNOSIS — N1832 Chronic kidney disease, stage 3b: Secondary | ICD-10-CM | POA: Diagnosis not present

## 2022-05-17 MED ORDER — CYCLOBENZAPRINE HCL 10 MG PO TABS
ORAL_TABLET | ORAL | 3 refills | Status: AC
Start: 2022-05-17 — End: ?

## 2022-05-17 MED ORDER — AMLODIPINE BESYLATE 5 MG PO TABS: 5.0000 mg | ORAL_TABLET | Freq: Every day | ORAL | 3 refills | Status: DC

## 2022-05-26 ENCOUNTER — Ambulatory Visit (HOSPITAL_COMMUNITY)
Admission: RE | Admit: 2022-05-26 | Discharge: 2022-05-26 | Disposition: A | Discharge status: Home / Self Care | Payer: Medicare HMO | Source: Ambulatory Visit | Attending: Nephrology | Admitting: Nephrology

## 2022-05-26 VITALS — BP 170/88 | HR 87 | Temp 97.4°F | Resp 17

## 2022-05-26 DIAGNOSIS — D631 Anemia in chronic kidney disease: Secondary | ICD-10-CM | POA: Insufficient documentation

## 2022-05-26 DIAGNOSIS — D638 Anemia in other chronic diseases classified elsewhere: Secondary | ICD-10-CM

## 2022-05-26 DIAGNOSIS — Z1231 Encounter for screening mammogram for malignant neoplasm of breast: Secondary | ICD-10-CM | POA: Diagnosis not present

## 2022-05-26 DIAGNOSIS — N1832 Chronic kidney disease, stage 3b: Secondary | ICD-10-CM | POA: Insufficient documentation

## 2022-05-26 LAB — IRON AND TIBC
Iron: 47 ug/dL (ref 28–170)
Saturation Ratios: 15 % (ref 10.4–31.8)
TIBC: 309 ug/dL (ref 250–450)
UIBC: 262 ug/dL

## 2022-05-26 LAB — FERRITIN: Ferritin: 75 ng/mL (ref 11–307)

## 2022-05-26 LAB — POCT HEMOGLOBIN-HEMACUE: Hemoglobin: 7.5 g/dL — ABNORMAL LOW (ref 12.0–15.0)

## 2022-05-26 MED ORDER — EPOETIN ALFA-EPBX 10000 UNIT/ML IJ SOLN: 20000.0000 [IU] | INTRAMUSCULAR | Status: DC

## 2022-05-26 MED ORDER — EPOETIN ALFA-EPBX 10000 UNIT/ML IJ SOLN
INTRAMUSCULAR | Status: AC
  Administered 2022-05-26: 20000 [IU] via SUBCUTANEOUS
  Filled 2022-05-26: qty 2

## 2022-05-26 NOTE — Progress Notes (Signed)
Shaquina at [facility] notified of pt's hemocue of 7.5.  Instructed to proceed with plan as ordered.  Pt instructed to call the office if any new bleeding or symptoms noted.

## 2022-05-29 ENCOUNTER — Ambulatory Visit: Payer: Medicare PPO | Admitting: Nurse Practitioner

## 2022-06-13 ENCOUNTER — Ambulatory Visit: Payer: Medicare PPO | Admitting: Pharmacist

## 2022-06-16 ENCOUNTER — Telehealth (HOSPITAL_COMMUNITY): Payer: Self-pay | Admitting: Radiology

## 2022-06-16 NOTE — Telephone Encounter (Signed)
Tried to return pt's call. Her VM is full, could not leave a message. JM

## 2022-06-21 ENCOUNTER — Other Ambulatory Visit (HOSPITAL_COMMUNITY): Payer: Self-pay | Admitting: *Deleted

## 2022-06-23 ENCOUNTER — Ambulatory Visit (HOSPITAL_COMMUNITY)
Admission: RE | Admit: 2022-06-23 | Discharge: 2022-06-23 | Disposition: A | Discharge status: Home / Self Care | Payer: Medicare HMO | Source: Ambulatory Visit | Attending: Nephrology | Admitting: Nephrology

## 2022-06-23 VITALS — BP 180/89 | HR 62 | Temp 97.8°F | Resp 18

## 2022-06-23 DIAGNOSIS — D638 Anemia in other chronic diseases classified elsewhere: Secondary | ICD-10-CM | POA: Insufficient documentation

## 2022-06-23 LAB — POCT HEMOGLOBIN-HEMACUE: Hemoglobin: 8.1 g/dL — ABNORMAL LOW (ref 12.0–15.0)

## 2022-06-23 MED ORDER — EPOETIN ALFA-EPBX 10000 UNIT/ML IJ SOLN
INTRAMUSCULAR | Status: AC
  Administered 2022-06-23: 20000 [IU] via SUBCUTANEOUS
  Filled 2022-06-23: qty 2

## 2022-06-23 MED ORDER — EPOETIN ALFA-EPBX 10000 UNIT/ML IJ SOLN: 20000.0000 [IU] | INTRAMUSCULAR | Status: DC

## 2022-07-03 ENCOUNTER — Encounter (HOSPITAL_COMMUNITY): Payer: Self-pay

## 2022-07-06 ENCOUNTER — Encounter (HOSPITAL_COMMUNITY): Payer: Self-pay

## 2022-07-07 ENCOUNTER — Ambulatory Visit (HOSPITAL_COMMUNITY)
Admission: RE | Admit: 2022-07-07 | Discharge: 2022-07-07 | Disposition: A | Discharge status: Home / Self Care | Payer: Medicare HMO | Source: Ambulatory Visit | Attending: Nephrology | Admitting: Nephrology

## 2022-07-07 DIAGNOSIS — D631 Anemia in chronic kidney disease: Secondary | ICD-10-CM | POA: Insufficient documentation

## 2022-07-07 DIAGNOSIS — N189 Chronic kidney disease, unspecified: Secondary | ICD-10-CM | POA: Insufficient documentation

## 2022-07-07 DIAGNOSIS — D638 Anemia in other chronic diseases classified elsewhere: Secondary | ICD-10-CM | POA: Diagnosis not present

## 2022-07-07 MED ORDER — SODIUM CHLORIDE 0.9 % IV SOLN
510.0000 mg | INTRAVENOUS | Status: DC
  Administered 2022-07-07: 510 mg via INTRAVENOUS
  Filled 2022-07-07: qty 510

## 2022-07-10 ENCOUNTER — Telehealth: Payer: Self-pay | Admitting: Nurse Practitioner

## 2022-07-10 ENCOUNTER — Other Ambulatory Visit: Payer: Self-pay | Admitting: Nurse Practitioner

## 2022-07-10 DIAGNOSIS — K219 Gastro-esophageal reflux disease without esophagitis: Secondary | ICD-10-CM | POA: Diagnosis not present

## 2022-07-10 DIAGNOSIS — E559 Vitamin D deficiency, unspecified: Secondary | ICD-10-CM | POA: Diagnosis not present

## 2022-07-10 DIAGNOSIS — M109 Gout, unspecified: Secondary | ICD-10-CM

## 2022-07-10 DIAGNOSIS — N185 Chronic kidney disease, stage 5: Secondary | ICD-10-CM | POA: Diagnosis not present

## 2022-07-10 DIAGNOSIS — I120 Hypertensive chronic kidney disease with stage 5 chronic kidney disease or end stage renal disease: Secondary | ICD-10-CM | POA: Diagnosis not present

## 2022-07-10 DIAGNOSIS — D631 Anemia in chronic kidney disease: Secondary | ICD-10-CM | POA: Diagnosis not present

## 2022-07-10 DIAGNOSIS — E669 Obesity, unspecified: Secondary | ICD-10-CM | POA: Diagnosis not present

## 2022-07-10 DIAGNOSIS — R809 Proteinuria, unspecified: Secondary | ICD-10-CM | POA: Diagnosis not present

## 2022-07-10 MED ORDER — PANTOPRAZOLE SODIUM 40 MG PO TBEC: DELAYED_RELEASE_TABLET | ORAL | 3 refills | Status: DC

## 2022-07-10 MED ORDER — PANTOPRAZOLE SODIUM 40 MG PO TBEC: DELAYED_RELEASE_TABLET | ORAL | 3 refills | Status: AC

## 2022-07-10 MED ORDER — ALLOPURINOL 100 MG PO TABS
ORAL_TABLET | ORAL | 3 refills | Status: DC
Start: 2022-07-10 — End: 2022-07-10

## 2022-07-10 MED ORDER — ALLOPURINOL 100 MG PO TABS
ORAL_TABLET | ORAL | 3 refills | Status: AC
Start: 2022-07-10 — End: ?

## 2022-07-10 NOTE — Addendum Note (Signed)
Addended by: Dionicio Stall on: 07/10/2022 01:35 PM   Modules accepted: Orders

## 2022-07-10 NOTE — Telephone Encounter (Signed)
Pharm is requesting refills on Allopurinol and pantoprozole pls.

## 2022-07-13 ENCOUNTER — Encounter: Payer: Self-pay | Admitting: Nurse Practitioner

## 2022-07-13 ENCOUNTER — Ambulatory Visit: Payer: Medicare HMO | Admitting: Nurse Practitioner

## 2022-07-13 ENCOUNTER — Encounter (HOSPITAL_COMMUNITY): Payer: Self-pay

## 2022-07-13 VITALS — BP 140/76 | HR 73 | Temp 97.7°F | Ht 70.0 in | Wt 205.4 lb

## 2022-07-13 DIAGNOSIS — G4733 Obstructive sleep apnea (adult) (pediatric): Secondary | ICD-10-CM | POA: Diagnosis not present

## 2022-07-13 DIAGNOSIS — E559 Vitamin D deficiency, unspecified: Secondary | ICD-10-CM | POA: Diagnosis not present

## 2022-07-13 DIAGNOSIS — M109 Gout, unspecified: Secondary | ICD-10-CM

## 2022-07-13 DIAGNOSIS — I10 Essential (primary) hypertension: Secondary | ICD-10-CM | POA: Diagnosis not present

## 2022-07-13 DIAGNOSIS — Z0000 Encounter for general adult medical examination without abnormal findings: Secondary | ICD-10-CM

## 2022-07-13 DIAGNOSIS — R6889 Other general symptoms and signs: Secondary | ICD-10-CM | POA: Diagnosis not present

## 2022-07-13 DIAGNOSIS — R053 Chronic cough: Secondary | ICD-10-CM

## 2022-07-13 DIAGNOSIS — E785 Hyperlipidemia, unspecified: Secondary | ICD-10-CM

## 2022-07-13 DIAGNOSIS — I671 Cerebral aneurysm, nonruptured: Secondary | ICD-10-CM

## 2022-07-13 DIAGNOSIS — K219 Gastro-esophageal reflux disease without esophagitis: Secondary | ICD-10-CM

## 2022-07-13 DIAGNOSIS — Z79899 Other long term (current) drug therapy: Secondary | ICD-10-CM | POA: Diagnosis not present

## 2022-07-13 DIAGNOSIS — I340 Nonrheumatic mitral (valve) insufficiency: Secondary | ICD-10-CM

## 2022-07-13 DIAGNOSIS — E538 Deficiency of other specified B group vitamins: Secondary | ICD-10-CM

## 2022-07-13 DIAGNOSIS — I5032 Chronic diastolic (congestive) heart failure: Secondary | ICD-10-CM

## 2022-07-13 DIAGNOSIS — N1832 Chronic kidney disease, stage 3b: Secondary | ICD-10-CM

## 2022-07-13 DIAGNOSIS — Z0001 Encounter for general adult medical examination with abnormal findings: Secondary | ICD-10-CM

## 2022-07-13 DIAGNOSIS — R9431 Abnormal electrocardiogram [ECG] [EKG]: Secondary | ICD-10-CM

## 2022-07-13 DIAGNOSIS — R7309 Other abnormal glucose: Secondary | ICD-10-CM

## 2022-07-13 DIAGNOSIS — G40909 Epilepsy, unspecified, not intractable, without status epilepticus: Secondary | ICD-10-CM | POA: Diagnosis not present

## 2022-07-13 DIAGNOSIS — I609 Nontraumatic subarachnoid hemorrhage, unspecified: Secondary | ICD-10-CM

## 2022-07-13 DIAGNOSIS — D638 Anemia in other chronic diseases classified elsewhere: Secondary | ICD-10-CM

## 2022-07-13 NOTE — Patient Instructions (Signed)
Food Basics for Chronic Kidney Disease Chronic kidney disease (CKD) is when your kidneys are not working well. They cannot remove waste, fluids, and other substances from your blood the way they should. These substances can build up, which can worsen kidney damage and affect how your body works. Eating certain foods can lead to a buildup of these substances. Changing your diet can help prevent more kidney damage. Diet changes may also delay dialysis or even keep you from needing it. What nutrients should I limit? Work with your treatment team and a food expert (dietitian) to make a meal plan that's right for you. Foods you can eat and foods you should limit or avoid will depend on the stage of your kidney disease and any other health conditions you have. The items listed below are not a complete list. Talk with your dietitian to learn what is best for you. Potassium Potassium affects how steadily your heart beats. Too much potassium in your blood can cause an irregular heartbeat or even a heart attack. You may need to limit foods that are high in potassium, such as: Liquid milk and soy milk. Salt substitutes that contain potassium. Fruits like bananas, apricots, nectarines, melon, prunes, raisins, kiwi, and oranges. Vegetables, such as potatoes, sweet potatoes, yams, tomatoes, leafy greens, beets, avocado, pumpkin, and winter squash. Beans, like lima beans. Nuts. Phosphorus Phosphorus is a mineral found in your bones. You need a balance between calcium and phosphorus to build and maintain healthy bones. Too much added phosphorus from the foods you eat can pull calcium from your bones. Losing calcium can make your bones weak and more likely to break. Too much phosphorus can also make your skin itch. You may need to limit foods that are high in phosphorus or that have added phosphorus, such as: Liquid milk and dairy products. Dark-colored sodas or soft drinks. Bran cereals and  oatmeal. Protein  Protein helps you make and keep muscle. Protein also helps to repair your body's cells and tissues. One of the natural breakdown products of protein is a waste product called urea. When your kidneys are not working well, they cannot remove types of waste like urea. Reducing protein in your diet can help keep urea from building up in your blood. Depending on your stage of kidney disease, you may need to eat smaller portions of foods that are high in protein. Sources of animal protein include: Meat (all types). Fish and seafood. Poultry. Eggs. Dairy. Other protein foods include: Beans and legumes. Nuts and nut butter. Soy, like tofu.  Sodium Salt (sodium) helps to keep a healthy balance of fluids in your body. Too much salt can increase your blood pressure, which can harm your heart and lungs. Extra salt can also cause your body to keep too much fluid, making your kidneys work harder. You may need to limit or avoid foods that are high in salt, such as: Salt seasonings. Soy and teriyaki sauce. Packaged, precooked, cured, or processed meats, such as sausages or meat loaves. Sardines. Salted crackers and snack foods. Fast food. Canned soups and most canned foods. Pickled foods. Vegetable juice. Boxed mixes or ready-to-eat boxed meals and side dishes. Bottled dressings, sauces, and marinades. Talk with your dietitian about how much potassium, phosphorus, protein, and salt you may have each day. Helpful tips Read food labels  Check the amount of salt in foods. Limit foods that have salt or sodium listed among the first five ingredients. Try to eat low-salt foods. Check the ingredient list   for added phosphorus or potassium. "Phos" in an ingredient is a sign that phosphorus has been added. Do not buy foods that are calcium-enriched or that have calcium added to them (are fortified). Buy canned vegetables and beans that say "no salt added" and rinse them before  eating. Lifestyle Limit the amount of protein you eat from animal sources each day. Focus on protein from plant sources, like tofu and dried beans, peas, and lentils. Do not add salt to food when cooking or before eating. Do not eat star fruit. It can be toxic for people with kidney problems. Talk with your health care provider before taking any vitamin or mineral supplements. If told by your health care provider, track how much liquid you drink so you can avoid drinking too much. You may need to include foods you eat that are made mostly from water, like gelatin, ice cream, soups, and juicy fruits and vegetables. If you have diabetes: If you have diabetes (diabetes mellitus) and CKD, you need to keep your blood sugar (glucose) in the target range recommended by your health care provider. Follow your diabetes management plan. This may include: Checking your blood glucose regularly. Taking medicines by mouth, or taking insulin, or both. Exercising for at least 30 minutes on 5 or more days each week, or as told by your health care provider. Tracking how many servings of carbohydrates you eat at each meal. Not using orange juice to treat low blood sugars. Instead, use apple juice, cranberry juice, or clear soda. You may be given guidelines on what foods and nutrients you may eat, and how much you can have each day. This depends on your stage of kidney disease and whether you have high blood pressure (hypertension). Follow the meal plan your dietitian gives you. To learn more: National Institute of Diabetes and Digestive and Kidney Diseases: niddk.nih.gov National Kidney Foundation: kidney.org Summary Chronic kidney disease (CKD) is when your kidneys are not working well. They cannot remove waste, fluids, and other substances from your blood the way they should. These substances can build up, which can worsen kidney damage and affect how your body works. Changing your diet can help prevent more  kidney damage. Diet changes may also delay dialysis or even keep you from needing it. Diet changes are different for each person with CKD. Work with a dietitian to set up a meal plan that is right for you. This information is not intended to replace advice given to you by your health care provider. Make sure you discuss any questions you have with your health care provider. Document Revised: 04/29/2021 Document Reviewed: 05/05/2019 Elsevier Patient Education  2024 Elsevier Inc.  

## 2022-07-13 NOTE — Progress Notes (Signed)
ANNUAL WELLNESS VISIT AND FOLLOW UP  Assessment and Plan:  Annual Medicare Wellness Visit Due annually  Health maintenance reviewed  Chronic diastolic heart failure Focus on weight loss  Emphasized salt restriction, less than 2000mg  a day. Daily monitoring weight - call office if 3 lb weight loss or gain in a day.  Encouraged regular exercise.  decrease your fluid intake to less than 2 L daily  Cardiology is managing   Mitral regurgitation Monitored by cardio, moderate per recent ECHO/MRI, has next planned in 2024, no SOB/CP  Essential hypertension Discussed DASH (Dietary Approaches to Stop Hypertension) DASH diet is lower in sodium than a typical American diet. Cut back on foods that are high in saturated fat, cholesterol, and trans fats. Eat more whole-grain foods, fish, poultry, and nuts Remain active and exercise as tolerated daily.  Monitor BP at home-Call if greater than 130/80.  Check CMP/CBC   Abnormal glucose Education: Reviewed 'ABCs' of diabetes management  Discussed goals to be met and/or maintained include A1C (<7) Blood pressure (<130/80) Cholesterol (LDL <70) Continue Eye Exam yearly  Continue Dental Exam Q6 mo Discussed dietary recommendations Discussed Physical Activity recommendations Check A1C  Hyperlipidemia Discussed lifestyle modifications. Recommended diet heavy in fruits and veggies, omega 3's. Decrease consumption of animal meats, cheeses, and dairy products. Remain active and exercise as tolerated. Continue to monitor. Check lipids/TSH  Vitamin D deficiency Continue supplement for goal of 60-100 Monitor Vitamin D levels  Medication management All medications discussed and reviewed in full. All questions and concerns regarding medications addressed.    OSA on CPAP Continue CPAP Dr. Vickey Huger following Weight loss encouraged   Brain aneurysm Dr. Corliss Skains released S/p coil, on brilinta Control blood pressure, cholesterol,  glucose, increase exercise.    Seizure disorder (HCC) R/t CVA/aneurysm; Dr. Corliss Skains released, on keppra, check levels here  - currently back to taking 1000 mg BID, recheck and reduce to 500 mg BID if remains elevated  - levetiracetam (keppra) level  Chronic spontaneous subarach intracran bleed due to cerebral aneurysm (HCC) Stable on MRI 2022; Dr. Vickey Huger follows -   Gout of foot, unspecified cause, unspecified chronicity, unspecified laterality Check uric acid as needed Low purine diet discussed Continue to monitor  Gastroesophageal reflux disease, esophagitis presence not specified Continue Prevacid PRN. No suspected reflux complications (Barret/stricture). Lifestyle modification:  wt loss, avoid meals 2-3h before bedtime. Consider eliminating food triggers:  chocolate, caffeine, EtOH, acid/spicy food.  Morbid obesity (HCC) - BMI 30+ with OSA Discussed appropriate BMI Diet modification. Physical activity. Encouraged/praised to build confidence.  Chronic kidney disease stage 3b [facility]) Nephrology following Discussed how what you eat and drink can aide in kidney protection. Stay well hydrated. Avoid high salt foods. Avoid NSAIDS. Keep BP and BG well controlled.   Take medications as prescribed. Remain active and exercise as tolerated daily. Maintain weight.  Continue to monitor. Check CMP/GFR/Microablumin  Anemia of chronic renal disease Has seen hematology; nephrology  Monitor CBC, iron, ferritin, TIBC  Persistent cough Improving - continue to monitor Cough- multifactorial-   Continue PPI , take consistently,  Add antihistamine if needed Continue CPAP If not improved follow up Consider ENT evaluation for laryngoscopy  Also possible mild dysphagia leading to daytime cough with drinking/eating - consider speech pathology referral if cough not improving  Prolonged QT Cardiology Dr. Elease Hashimoto Secondary to Keppra use  Recheck levels, reduce dose to 500 mg tabs if  needed  B12 deficiency Monitor levels  Patient defers lab work today.  She has an  apt scheduled with Nephrology tomorrow 07/14/22 to assess potassium levels and GFR and will also follow up with Hematology next week for infusion and hemoglobin check.  No orders of the defined types were placed in this encounter.   Notify office for further evaluation and treatment, questions or concerns if any reported s/s fail to improve.   The patient was advised to call back or seek an in-person evaluation if any symptoms worsen or if the condition fails to improve as anticipated.   Further disposition pending results of labs. Discussed med's effects and SE's.    I discussed the assessment and treatment plan with the patient. The patient was provided an opportunity to ask questions and all were answered. The patient agreed with the plan and demonstrated an understanding of the instructions.  Discussed med's effects and SE's. Screening labs and tests as requested with regular follow-up as recommended.  I provided 35 minutes of face-to-face time during this encounter including counseling, chart review, and critical decision making was preformed.  Today's Plan of Care is based on a patient-centered health care approach known as shared decision making - the decisions, tests and treatments allow for patient preferences and values to be balanced with clinical evidence.     Future Appointments  Date Time Provider Department Center  07/21/2022  1:00 PM [id number] None  10/26/2022 11:00 AM Lucky Cowboy, MD [facility] None  04/04/2023  2:00 PM Adela Glimpse, NP [facility] None  07/16/2023  2:30 PM Adela Glimpse, NP [facility] None    Plan:   During the course of the visit the patient was educated and counseled about appropriate screening and preventive services including:   Pneumococcal vaccine  Prevnar 13 Influenza vaccine Td vaccine Screening electrocardiogram Bone densitometry  screening Colorectal cancer screening Diabetes screening Glaucoma screening Nutrition counseling  Advanced directives: requested   HPI  67 y.o. female  presents for AWV and 3 month follow up. She has Essential hypertension; Mitral regurgitation; (HFpEF) heart failure with preserved ejection fraction (HCC); OSA on CPAP; Vitamin D deficiency; Hyperlipidemia; Brain aneurysm; Abnormal glucose; Medication management; Morbid obesity (HCC) - BMI 30+ with OSA; Gout; Gastroesophageal reflux disease; Anemia of chronic disease- renal; Allergic rhinitis; Persistent cough; CKD stage G3b/A2, GFR 30-44 and albumin creatinine ratio 30-299 mg/g (HCC); History of anemia due to chronic kidney disease; Nocturia more than twice per night; Chronic spontaneous subarach intracran bleed due to cerebral aneurysm (HCC); Seizure disorder (HCC); Severe obstructive sleep apnea-hypopnea syndrome; Prolonged Q-T interval on ECG; B12 deficiency; Deterioration in renal function; and Hyperkalemia on their problem list.  Overall she reports feeling well today.  She reports husband passed in Dec 04 2020 after hospice, she reports is doing fairly. Family supports, 2 boys, 4 granddaughters, 1 great grandson.   She continues to follow with nephrology, Dr. Melburn Popper for CKD IV.  She also follows with Hematology for anemia of chronic disease secondary to kidney failure.  Received iron infusions. Last 06/23/22.  She continues to work at [employer] center, going to work part time- 4 hours 2-3 days a week.   She was admitted 01/2017 for left ICA aneursym for a pipeline stent but had a seizure and AMS after her procedure, she is on brilinta, she was following with Dr. Shan Levans but was released as no seizures since that time, continues on keppra, today states taking 1000 mg BID despite elevation and recommendations to reduce at last visit. Mas mild residual R arm weakness and some spasticity. Had follow up MRI 04/30/2020 which  showed  essentially unchanged, stable chronic microvascular ischemia, redemonstrated punctate chronic microhemorrhage.  She was on CPAP since 2013, had at [facility], was doing well but recently CPAP machine was recalled, needing replacement. She was referred to Dr. Vickey Huger, recent sleep study 04/2020 showed severe OSA with REM hypoxia, AHI was 41.8/h, recommended CPAP at 16 cm. CPAP finally arrived, pending supplies.   She has reported persistent dry cough, triggered by inhalation, drinking water or any food intake, but notably worse at night. She reports coughing with drinking or food is ongoing since her aneurysm/stroke/seizure, but has noted worse coughing at night. Was started on famotidine and PPI, reduced caffeine with some improvement.   She is on estrogen pills for hot flashes, had been taking premarin 0.9 mg for over 10 years, last visit reduced to 0.45 mg, reports didn't pick up due to $190 cost and has been off. States only has hot flashes when outdoors in the heat.   BMI is Body mass index is 29.47 kg/m., she has been working on diet and exercise. Weight up after recent steroid taper.  Wt Readings from Last 3 Encounters:  07/13/22 205 lb 6.4 oz (93.2 kg)  04/03/22 208 lb 9.6 oz (94.6 kg)  01/04/22 221 lb 9.6 oz (100.5 kg)   PVD and CHF MR, follows with Dr. Carney Harder.  Had ECHO 07/2019 showing normal LV function, Mod MR, mass in the RA, but follow up cardiac MRI on 10/02/2019 showed no evidence of atrial mass.   Her blood pressure has been controlled at home, states has been good at home, today their BP is BP: (!) 140/76 She does workout, walks daily for 30 mins. She denies chest pain, shortness of breath, dizziness.   She is not on cholesterol medication and denies myalgias. Her cholesterol is not at goal but typically well controlled with lifestyle. The cholesterol last visit was:   Lab Results  Component Value Date   CHOL 160 04/03/2022   HDL 80 04/03/2022   LDLCALC 64 04/03/2022    TRIG 75 04/03/2022   CHOLHDL 2.0 04/03/2022   She has been working on diet and exercise for glucose management, and denies paresthesia of the feet, polydipsia, polyuria and visual disturbances. Last A1C in the office was:  Lab Results  Component Value Date   HGBA1C 5.6 04/03/2022    Patient is on allopurinol 100 mg daily for gout, does not report a recent flare.  Lab Results  Component Value Date   LABURIC 7.6 (H) 04/03/2022   Patient is on Vitamin D supplement, taking 1 caps of unsure dose ? 5000 IU Lab Results  Component Value Date   VD25OH 60 04/03/2022     She has CKD with deterioration in thelast year and was referred to [facility]  Reports saw in April and numbers were improved -  Lab Results  Component Value Date   CREATININE 2.67 (H) 04/26/2022   CREATININE 4.56 (H) 04/03/2022   CREATININE 1.86 (H) 01/04/2022   Lab Results  Component Value Date   EGFR 18.0 04/24/2022   EGFR 10 (L) 04/03/2022   EGFR 30 (L) 01/04/2022   She has persistent anemia, was recently referred to evaluation by hematology with unremarkable workup in 2022, biopsy/aspiration were nonspecific and felt likely anemia of chronic disease secondary renal (has upcoming appointment with nephrology). Some iron def as well and completed 2-3 months of ferrous sulphate.  Dr. Shirline Frees follows.     Latest Ref Rng & Units 06/23/2022   12:41 PM  05/26/2022   11:40 AM 04/28/2022   12:23 PM  CBC  Hemoglobin 12.0 - 15.0 g/dL 8.1  7.5  8.1    Lab Results  Component Value Date   IRON 47 05/26/2022   TIBC 309 05/26/2022   FERRITIN 75 05/26/2022   She is unsure of her B12 supplement dose, was advised to reduce after last visit  Lab Results  Component Value Date   VITAMINB12 >2,000 (H) 01/26/2021     Current Medications:    Current Outpatient Medications (Cardiovascular):    amLODipine (NORVASC) 5 MG tablet, Take 1 tablet (5 mg total) by mouth daily.   chlorthalidone (HYGROTON) 25 MG tablet, Take 1  tablet (25 mg total) by mouth daily.   olmesartan (BENICAR) 40 MG tablet, Take 1 tablet  Daily  for BP   rosuvastatin (CRESTOR) 5 MG tablet, Take 1 tab three days a week (MWF) for cholesterol.  Current Outpatient Medications (Respiratory):    fexofenadine (ALLEGRA ALLERGY) 180 MG tablet, Take 1 tablet (180 mg total) by mouth daily.  Current Outpatient Medications (Analgesics):    allopurinol (ZYLOPRIM) 100 MG tablet, TAKE 1 TABLET DAILY TO PREVENT GOUT   aspirin EC 81 MG tablet, Take 1 tablet (81 mg total) by mouth daily.  Current Outpatient Medications (Hematological):    ticagrelor (BRILINTA) 90 MG TABS tablet, Take 1 tablet  2 x /day (every 12 hours )  to prevent Blood Clots (Patient taking differently: Take one tablet daily for blood clots)  Current Outpatient Medications (Other):    cholecalciferol (VITAMIN D3) 25 MCG (1000 UT) tablet, Take 1,000 Units by mouth daily.   cyclobenzaprine (FLEXERIL) 10 MG tablet, Take 1 tablet at Bedtime as Needed for Muscle Spasms   famotidine (PEPCID) 20 MG tablet, TAKE 1 TABLET BY MOUTH DAILY AT 9 AM EVERY MORNING, 30 TO 60 MINUTES PRIOR TO BREAKFAST FOR REFLUX.   levETIRAcetam (KEPPRA) 1000 MG tablet, Take one tablet (1000mg ) daily.   Multiple Vitamins-Minerals (MULTIVITAMIN WITH MINERALS) tablet, Take 1 tablet by mouth daily.   pantoprazole (PROTONIX) 40 MG tablet, Take one tablet at bedtime  Health Maintenance:   Immunization History  Administered Date(s) Administered   Influenza Inj Mdck Quad With Preservative 10/30/2018   Influenza Split 11/11/2013, 10/23/2014   Influenza, High Dose Seasonal PF 01/26/2021, 12/28/2021   Influenza, Seasonal, Injecte, Preservative Fre 10/26/2015   Influenza,inj,Quad PF,6+ Mos 02/23/2017   Influenza-Unspecified 11/25/2012   Moderna SARS-COV2 Booster Vaccination 03/06/2020   Moderna Sars-Covid-2 Vaccination 02/13/2019, 03/13/2019   PNEUMOCOCCAL CONJUGATE-20 01/26/2021   Tdap 08/25/2013   Health Maintenance   Topic Date Due   Zoster Vaccines- Shingrix (1 of 2) Never done   COVID-19 Vaccine (3 - Moderna risk series) 04/03/2020   INFLUENZA VACCINE  08/24/2022   MAMMOGRAM  05/14/2023   Medicare Annual Wellness (AWV)  07/13/2023   DTaP/Tdap/Td (2 - Td or Tdap) 08/26/2023   Colonoscopy  09/05/2023   Pneumonia Vaccine 67+ Years old  Completed   DEXA SCAN  Completed   Hepatitis C Screening  Completed   HPV VACCINES  Aged Out   Shingrix: discussed, can get at pharmacy  Covid 19: 2/2, 2021, moderna, + booster 02/2020  Pap: TAH, DONE  MGM: 05/14/2022 DEXA: 05/13/21 T -0.9, normal    Colonoscopy: 10/2018 Dr. Loreta Ave, 5 year recall, hx of polyps EGD: 10/2018 Dr. Loreta Ave  Last Dental Exam: Dental works, last 2023, as partial/bridges,  Last Eye Exam: [facility] 2024, wears glasses, mild cataract   Patient Care Team: Oneta Rack,  Chrissie Noa, MD as PCP - General (Internal Medicine) Nahser, Deloris Ping, MD as PCP - Cardiology (Cardiology) Kennon Rounds as Physician Assistant (Physician Assistant) Charna Elizabeth, MD as Consulting Physician (Gastroenterology) Kathryne Hitch, MD as Consulting Physician (Orthopedic Surgery) Julieanne Cotton, MD as Consulting Physician (Interventional Radiology) Lucky Cowboy, MD as Referring Physician (Internal Medicine) Dohmeier, Porfirio Mylar, MD as Consulting Physician (Neurology) Carmelina Dane, [facility] (Inactive) as Pharmacist (Pharmacist)  Medical History:  Past Medical History:  Diagnosis Date   Anemia    Arthritis    Chronic diastolic heart failure (HCC)    GERD (gastroesophageal reflux disease)    Heart murmur    Hyperlipidemia    Hypertension may, 1973   Mitral regurgitation    a. Echo 01/2012 mild LVH, EF 55-65%, Gr 2 DD, mod MR, mild LAE, PASP 36;  b. Echo (02/2013):  EF 55-60%, Gr 1 DD, mild to mod MR, mild LAE (no sig change since prior echo) // c. Echo 10/18: EF 55-60, normal wall motion, grade 2 diastolic dysfunction, trivial AI, mild to moderate  MR, mild LAE, trivial PI, PASP 38     OSA on CPAP    7 yrs   Seizures (HCC) 01/2017   Stroke (HCC) 07   no weakness jan 29th 2019 no defecits   Subarachnoid hemorrhage due to ruptured aneurysm (HCC)    2003 - s/p coiling   Unspecified vitamin D deficiency    Allergies Allergies  Allergen Reactions   Promethazine-Dm     Palpitations   Ace Inhibitors Nausea And Vomiting   Aspirin Nausea And Vomiting    Can take coated Aspirin   SURGICAL HISTORY She  has a past surgical history that includes Aneurysm coiling; head surgery; Tonsillectomy and adenoidectomy; Radiology with anesthesia (N/A, 04/23/2013); IR 3D Independent Wkst (09/04/2016); IR Angiogram Extremity Left (09/04/2016); IR ANGIO INTRA EXTRACRAN SEL COM CAROTID INNOMINATE BILAT MOD SED (09/04/2016); IR ANGIO VERTEBRAL SEL VERTEBRAL BILAT MOD SED (09/04/2016); IR Radiologist Eval & Mgmt (10/11/2016); Radiology with anesthesia (N/A, 10/30/2016); Tubal ligation; Radiology with anesthesia (N/A, 02/21/2017); IR Transcath/Emboliz (02/21/2017); IR ANGIO INTRA EXTRACRAN SEL INTERNAL CAROTID UNI L MOD SED (02/21/2017); IR NEURO EACH ADD'L AFTER BASIC UNI LEFT (MS) (02/21/2017); IR Angiogram Follow Up Study (02/21/2017); IR Radiologist Eval & Mgmt (03/07/2017); Abdominal hysterectomy; Esophagogastroduodenoscopy (egd) with propofol (N/A, 09/05/2018); Colonoscopy with propofol (N/A, 09/05/2018); and Givens capsule study (N/A, 10/17/2018).   FAMILY HISTORY Her family history includes Cancer (age of onset: 48) in her sister; Hypertension in her father, sister, sister, and sister; Kidney disease in her father.   SOCIAL HISTORY She  reports that she quit smoking about 23 years ago. Her smoking use included cigarettes. She has a 0.75 pack-year smoking history. She has never used smokeless tobacco. She reports that she does not drink alcohol and does not use drugs.  MEDICARE WELLNESS OBJECTIVES: Physical activity:   Cardiac risk factors:   Depression/mood screen:       07/13/2022    3:06 PM  Depression screen PHQ 2/9  Decreased Interest 0  Down, Depressed, Hopeless 0  PHQ - 2 Score 0    ADLs:     07/13/2022    3:06 PM 09/15/2021    8:29 PM  In your present state of health, do you have any difficulty performing the following activities:  Hearing? 0 0  Vision? 0 0  Difficulty concentrating or making decisions? 0 0  Walking or climbing stairs? 0 0  Dressing or bathing? 0 0  Doing errands, shopping?  0 0     Cognitive Testing  Alert? Yes  Normal Appearance?Yes  Oriented to person? Yes  Place? Yes   Time? Yes  Recall of three objects?  Yes  Can perform simple calculations? Yes  Displays appropriate judgment?Yes  Can read the correct time from a watch face?Yes  EOL planning: Does Patient Have a Medical Advance Directive?: No     Review of Systems: Review of Systems  Constitutional:  Negative for malaise/fatigue and weight loss.  HENT:  Negative for hearing loss and tinnitus.   Eyes:  Negative for blurred vision and double vision.  Respiratory:  Positive for cough. Negative for sputum production, shortness of breath and wheezing.   Cardiovascular:  Negative for chest pain, palpitations, orthopnea, claudication and leg swelling.  Gastrointestinal:  Negative for abdominal pain, blood in stool, constipation, diarrhea, heartburn, melena, nausea and vomiting.  Genitourinary: Negative.   Musculoskeletal:  Negative for joint pain and myalgias.  Skin:  Negative for rash.  Neurological:  Negative for dizziness, tingling, sensory change, weakness and headaches.  Endo/Heme/Allergies:  Negative for polydipsia.  Psychiatric/Behavioral: Negative.    All other systems reviewed and are negative.   Physical Exam: Estimated body mass index is 29.47 kg/m as calculated from the following:   Height as of this encounter: 5\' 10"  (1.778 m).   Weight as of this encounter: 205 lb 6.4 oz (93.2 kg). BP (!) 140/76   Pulse 73   Temp 97.7 F (36.5 C)   Ht  5\' 10"  (1.778 m)   Wt 205 lb 6.4 oz (93.2 kg)   SpO2 99%   BMI 29.47 kg/m  General Appearance: Well nourished, in no apparent distress.  Eyes: PERRLA, EOMs, conjunctiva no swelling or erythema Sinuses: No Frontal/maxillary tenderness  ENT/Mouth: Ext aud canals clear, normal light reflex with TMs without erythema, bulging.  No erythema, swelling, or exudate on post pharynx. Tonsils not swollen or erythematous. Hearing normal.  Neck: Supple, thyroid subtly enlarged, no nodules. No bruits  Respiratory: Respiratory effort normal, BS equal bilaterally without rales, rhonchi, wheezing or stridor.  Cardio: RRR with holosystolic murmur LSB, without rubs or gallops. Brisk peripheral pulses with 2+ edema.  Chest: symmetric, with normal excursions and percussion.  Breasts: Defer, getting annual mammogram, no concerns Abdomen: Soft, nontender, no guarding, rebound, hernias, masses, or organomegaly.  Lymphatics: Non tender without lymphadenopathy.  Genitourinary: defer Musculoskeletal: Full ROM all peripheral extremities,5/5 strength, and antalgic gait.  Skin: Warm, dry without rashes, lesions, ecchymosis. Neuro: Cranial nerves intact, reflexes equal bilaterally. Normal muscle tone, no cerebellar symptoms. Sensation intact.  Psych: Awake and oriented X 3, normal affect, Insight and Judgment appropriate.   06/01/2021 - [facility] [place] 3.7 cm  Medicare Attestation I have personally reviewed: The patient's medical and social history Their use of alcohol, tobacco or illicit drugs Their current medications and supplements The patient's functional ability including ADLs,fall risks, home safety risks, cognitive, and hearing and visual impairment Diet and physical activities Evidence for depression or mood disorders  The patient's weight, height, BMI, and visual acuity have been recorded in the chart.  I have made referrals, counseling, and provided education to the patient based on review of the above and I have  provided the patient with a written personalized care plan for preventive services.     Adela Glimpse, DNP, AGNP-C 3:07 PM [facility]

## 2022-07-14 DIAGNOSIS — N185 Chronic kidney disease, stage 5: Secondary | ICD-10-CM | POA: Diagnosis not present

## 2022-07-17 ENCOUNTER — Other Ambulatory Visit: Payer: Self-pay

## 2022-07-17 MED ORDER — AMLODIPINE BESYLATE 5 MG PO TABS: 5.0000 mg | ORAL_TABLET | Freq: Every day | ORAL | 3 refills | Status: DC

## 2022-07-21 ENCOUNTER — Ambulatory Visit (HOSPITAL_COMMUNITY)
Admission: RE | Admit: 2022-07-21 | Discharge: 2022-07-21 | Disposition: A | Discharge status: Home / Self Care | Payer: Medicare HMO | Source: Ambulatory Visit | Attending: Nephrology | Admitting: Nephrology

## 2022-07-21 VITALS — BP 165/84 | HR 68 | Temp 97.8°F | Resp 17

## 2022-07-21 DIAGNOSIS — D631 Anemia in chronic kidney disease: Secondary | ICD-10-CM | POA: Diagnosis not present

## 2022-07-21 DIAGNOSIS — D638 Anemia in other chronic diseases classified elsewhere: Secondary | ICD-10-CM | POA: Diagnosis not present

## 2022-07-21 DIAGNOSIS — N189 Chronic kidney disease, unspecified: Secondary | ICD-10-CM | POA: Insufficient documentation

## 2022-07-21 MED ORDER — EPOETIN ALFA-EPBX 10000 UNIT/ML IJ SOLN
INTRAMUSCULAR | Status: AC
  Administered 2022-07-21: 20000 [IU] via SUBCUTANEOUS
  Filled 2022-07-21: qty 2

## 2022-07-21 MED ORDER — EPOETIN ALFA-EPBX 10000 UNIT/ML IJ SOLN: 20000.0000 [IU] | INTRAMUSCULAR | Status: DC

## 2022-07-21 MED ORDER — SODIUM CHLORIDE 0.9 % IV SOLN
510.0000 mg | INTRAVENOUS | Status: DC
  Administered 2022-07-21: 510 mg via INTRAVENOUS
  Filled 2022-07-21: qty 510

## 2022-07-21 NOTE — Progress Notes (Signed)
Hemocue 7.4/7.3 (checked twice). Called [facility], instructed to call Dr. Doristine Church cell. Verbal order given to change frequency to every 2 weeks, next appointment changed, pt given new card with understanding verbalized. Also called and left voicemail for Amber of changes and to send/fax new orders.

## 2022-07-24 LAB — POCT HEMOGLOBIN-HEMACUE: Hemoglobin: 7.3 g/dL — ABNORMAL LOW (ref 12.0–15.0)

## 2022-08-04 ENCOUNTER — Encounter (HOSPITAL_COMMUNITY)
Admission: RE | Admit: 2022-08-04 | Discharge: 2022-08-04 | Disposition: A | Discharge status: Home / Self Care | Payer: Medicare HMO | Source: Ambulatory Visit | Attending: Nephrology | Admitting: Nephrology

## 2022-08-04 VITALS — BP 138/95 | HR 72 | Temp 97.7°F | Resp 17

## 2022-08-04 DIAGNOSIS — N185 Chronic kidney disease, stage 5: Secondary | ICD-10-CM | POA: Diagnosis not present

## 2022-08-04 DIAGNOSIS — D631 Anemia in chronic kidney disease: Secondary | ICD-10-CM | POA: Diagnosis not present

## 2022-08-04 DIAGNOSIS — D638 Anemia in other chronic diseases classified elsewhere: Secondary | ICD-10-CM | POA: Insufficient documentation

## 2022-08-04 LAB — FERRITIN: Ferritin: 394 ng/mL — ABNORMAL HIGH (ref 11–307)

## 2022-08-04 LAB — IRON AND TIBC
Iron: 68 ug/dL (ref 28–170)
Saturation Ratios: 22 % (ref 10.4–31.8)
TIBC: 314 ug/dL (ref 250–450)
UIBC: 246 ug/dL

## 2022-08-04 LAB — POCT HEMOGLOBIN-HEMACUE: Hemoglobin: 8.5 g/dL — ABNORMAL LOW (ref 12.0–15.0)

## 2022-08-04 MED ORDER — EPOETIN ALFA-EPBX 10000 UNIT/ML IJ SOLN
20000.0000 [IU] | INTRAMUSCULAR | Status: DC
  Administered 2022-08-04: 20000 [IU] via SUBCUTANEOUS

## 2022-08-04 MED ORDER — EPOETIN ALFA-EPBX 10000 UNIT/ML IJ SOLN
INTRAMUSCULAR | Status: AC
  Filled 2022-08-04: qty 2

## 2022-08-09 ENCOUNTER — Other Ambulatory Visit (HOSPITAL_COMMUNITY): Payer: Self-pay | Admitting: Interventional Radiology

## 2022-08-09 DIAGNOSIS — I671 Cerebral aneurysm, nonruptured: Secondary | ICD-10-CM

## 2022-08-14 ENCOUNTER — Telehealth: Payer: Self-pay | Admitting: Nurse Practitioner

## 2022-08-14 DIAGNOSIS — I10 Essential (primary) hypertension: Secondary | ICD-10-CM

## 2022-08-14 MED ORDER — OLMESARTAN MEDOXOMIL 40 MG PO TABS
ORAL_TABLET | ORAL | 3 refills | Status: DC
Start: 2022-08-14 — End: 2022-09-26

## 2022-08-14 NOTE — Telephone Encounter (Signed)
Olmesartan sent. And patient no longer takes Voltaren

## 2022-08-14 NOTE — Telephone Encounter (Signed)
Pharmacy is requesting refills for patient on Olmesartan and Diclofenac

## 2022-08-14 NOTE — Addendum Note (Signed)
Addended by: Dionicio Stall on: 08/14/2022 02:22 PM   Modules accepted: Orders

## 2022-08-17 ENCOUNTER — Encounter: Payer: Self-pay | Admitting: Internal Medicine

## 2022-08-18 ENCOUNTER — Telehealth: Payer: Self-pay | Admitting: Nurse Practitioner

## 2022-08-18 ENCOUNTER — Other Ambulatory Visit: Payer: Self-pay | Admitting: Nurse Practitioner

## 2022-08-18 ENCOUNTER — Ambulatory Visit (HOSPITAL_COMMUNITY)
Admission: RE | Admit: 2022-08-18 | Discharge: 2022-08-18 | Disposition: A | Discharge status: Home / Self Care | Payer: Medicare HMO | Source: Ambulatory Visit | Attending: Nephrology | Admitting: Nephrology

## 2022-08-18 ENCOUNTER — Encounter (HOSPITAL_COMMUNITY): Payer: Medicare HMO

## 2022-08-18 VITALS — BP 131/81 | HR 69 | Temp 97.8°F | Resp 17

## 2022-08-18 DIAGNOSIS — D638 Anemia in other chronic diseases classified elsewhere: Secondary | ICD-10-CM | POA: Diagnosis not present

## 2022-08-18 DIAGNOSIS — N185 Chronic kidney disease, stage 5: Secondary | ICD-10-CM | POA: Diagnosis not present

## 2022-08-18 LAB — RENAL FUNCTION PANEL
Albumin: 4 g/dL (ref 3.5–5.0)
Anion gap: 9 (ref 5–15)
BUN: 61 mg/dL — ABNORMAL HIGH (ref 8–23)
CO2: 20 mmol/L — ABNORMAL LOW (ref 22–32)
Calcium: 9.2 mg/dL (ref 8.9–10.3)
Chloride: 105 mmol/L (ref 98–111)
Creatinine, Ser: 3.45 mg/dL — ABNORMAL HIGH (ref 0.44–1.00)
GFR, Estimated: 14 mL/min — ABNORMAL LOW (ref >60)
Glucose, Bld: 88 mg/dL (ref 70–99)
Phosphorus: 4.5 mg/dL (ref 2.5–4.6)
Potassium: 5.1 mmol/L (ref 3.5–5.1)
Sodium: 134 mmol/L — ABNORMAL LOW (ref 135–145)

## 2022-08-18 LAB — POCT HEMOGLOBIN-HEMACUE: Hemoglobin: 8.5 g/dL — ABNORMAL LOW (ref 12.0–15.0)

## 2022-08-18 MED ORDER — EPOETIN ALFA-EPBX 10000 UNIT/ML IJ SOLN: 20000.0000 [IU] | INTRAMUSCULAR | Status: DC

## 2022-08-18 MED ORDER — AMLODIPINE BESYLATE 5 MG PO TABS: 5.0000 mg | ORAL_TABLET | Freq: Every day | ORAL | 3 refills | Status: DC

## 2022-08-18 MED ORDER — EPOETIN ALFA-EPBX 10000 UNIT/ML IJ SOLN
INTRAMUSCULAR | Status: AC
  Administered 2022-08-18: 20000 [IU] via SUBCUTANEOUS
  Filled 2022-08-18: qty 2

## 2022-08-18 NOTE — Telephone Encounter (Signed)
Requesting refill on Amlodipine. Pls send through [facility]

## 2022-08-21 MED ORDER — AMLODIPINE BESYLATE 5 MG PO TABS: 5.0000 mg | ORAL_TABLET | Freq: Every day | ORAL | 3 refills | Status: DC

## 2022-08-21 NOTE — Addendum Note (Signed)
Addended by: Dionicio Stall on: 08/21/2022 09:11 AM   Modules accepted: Orders

## 2022-08-22 ENCOUNTER — Ambulatory Visit (HOSPITAL_COMMUNITY)
Admission: RE | Admit: 2022-08-22 | Discharge: 2022-08-22 | Disposition: A | Discharge status: Home / Self Care | Payer: Medicare HMO | Source: Ambulatory Visit | Attending: Interventional Radiology | Admitting: Interventional Radiology

## 2022-08-22 DIAGNOSIS — I671 Cerebral aneurysm, nonruptured: Secondary | ICD-10-CM | POA: Insufficient documentation

## 2022-08-24 DIAGNOSIS — E875 Hyperkalemia: Secondary | ICD-10-CM | POA: Diagnosis not present

## 2022-08-24 DIAGNOSIS — N185 Chronic kidney disease, stage 5: Secondary | ICD-10-CM | POA: Diagnosis not present

## 2022-08-24 DIAGNOSIS — D631 Anemia in chronic kidney disease: Secondary | ICD-10-CM | POA: Diagnosis not present

## 2022-08-24 DIAGNOSIS — I120 Hypertensive chronic kidney disease with stage 5 chronic kidney disease or end stage renal disease: Secondary | ICD-10-CM | POA: Diagnosis not present

## 2022-08-24 DIAGNOSIS — N2581 Secondary hyperparathyroidism of renal origin: Secondary | ICD-10-CM | POA: Diagnosis not present

## 2022-08-28 ENCOUNTER — Other Ambulatory Visit: Payer: Self-pay | Admitting: Nurse Practitioner

## 2022-09-01 ENCOUNTER — Encounter (HOSPITAL_COMMUNITY)
Admission: RE | Admit: 2022-09-01 | Discharge: 2022-09-01 | Disposition: A | Discharge status: Home / Self Care | Payer: Medicare HMO | Source: Ambulatory Visit | Attending: Nephrology | Admitting: Nephrology

## 2022-09-01 VITALS — BP 127/72 | HR 94 | Temp 97.8°F | Resp 18

## 2022-09-01 DIAGNOSIS — D638 Anemia in other chronic diseases classified elsewhere: Secondary | ICD-10-CM | POA: Insufficient documentation

## 2022-09-01 DIAGNOSIS — N1832 Chronic kidney disease, stage 3b: Secondary | ICD-10-CM | POA: Diagnosis not present

## 2022-09-01 DIAGNOSIS — D631 Anemia in chronic kidney disease: Secondary | ICD-10-CM | POA: Diagnosis not present

## 2022-09-01 LAB — POCT HEMOGLOBIN-HEMACUE: Hemoglobin: 8 g/dL — ABNORMAL LOW (ref 12.0–15.0)

## 2022-09-01 LAB — FERRITIN: Ferritin: 123 ng/mL (ref 11–307)

## 2022-09-01 LAB — IRON AND TIBC
Iron: 58 ug/dL (ref 28–170)
Saturation Ratios: 20 % (ref 10.4–31.8)
TIBC: 298 ug/dL (ref 250–450)
UIBC: 240 ug/dL

## 2022-09-01 MED ORDER — EPOETIN ALFA-EPBX 10000 UNIT/ML IJ SOLN: 20000.0000 [IU] | INTRAMUSCULAR | Status: DC

## 2022-09-01 MED ORDER — EPOETIN ALFA-EPBX 10000 UNIT/ML IJ SOLN
INTRAMUSCULAR | Status: AC
  Administered 2022-09-01: 20000 [IU] via SUBCUTANEOUS
  Filled 2022-09-01: qty 1

## 2022-09-02 IMAGING — MR MR HEAD W/O CM
8 of 10 series · 34 of 48 positions shown · non-contrast
Comparison: MRI head MRA head 03/05/2019

CLINICAL DATA: Cerebral aneurysm follow-up

EXAM:
MRI HEAD WITHOUT CONTRAST
MRA HEAD WITHOUT CONTRAST
TECHNIQUE: Multiplanar, multiecho pulse sequences of the brain and surrounding
structures were obtained without intravenous contrast. Angiographic
images of the head were obtained using MRA technique without
contrast.

[Series 3: DWI · axial · 3.0mm · 1.09mm/px · z∈[-111,+22]mm · 8 of 104 slices shown (1 of 4)]
[im 1/104]
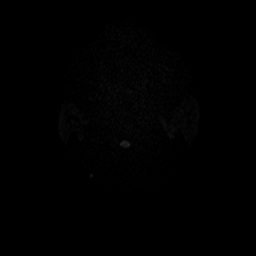
[im 12/104]
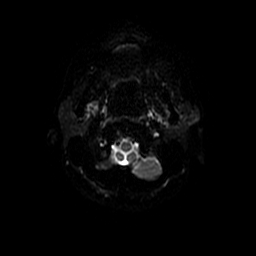
[im 35/104]
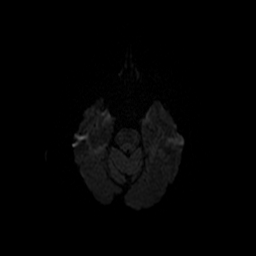
[im 46/104]
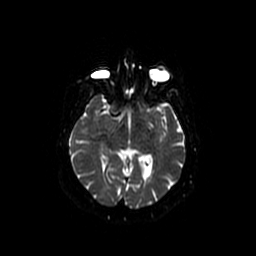
[im 58/104]
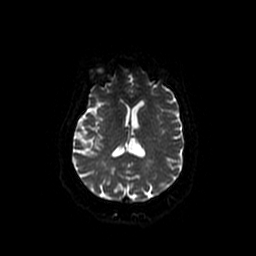
[im 69/104]
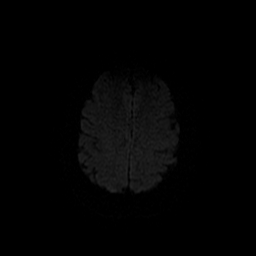
[im 92/104]
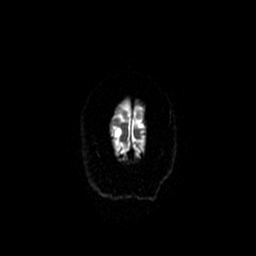
[im 104/104]
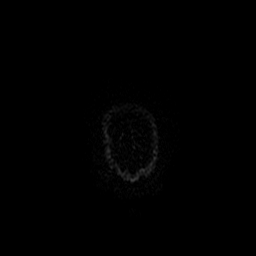

[Series 4: DWI · coronal · 5.0mm · 1.09mm/px · 7 of 68 slices shown (2 of 4)]
[im 1/68]
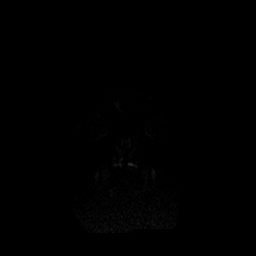
[im 12/68]
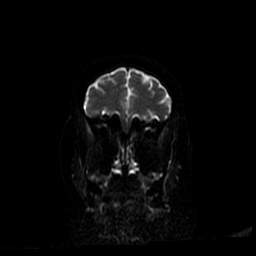
[im 23/68]
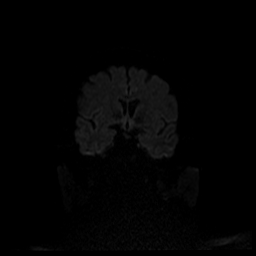
[im 34/68]
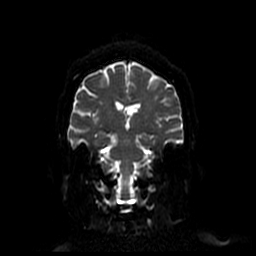
[im 45/68]
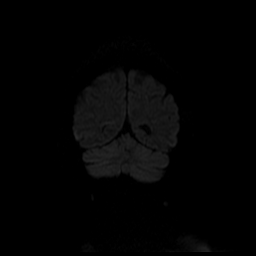
[im 56/68]
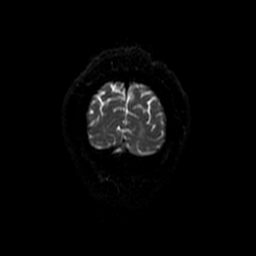
[im 68/68]
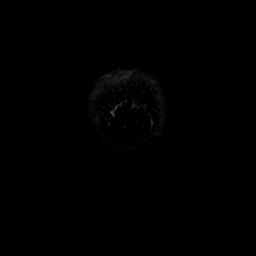

[Series 5: T1 · sagittal · 5.0mm · 0.47mm/px · 2 of 23 slices shown]
[im 1/23]
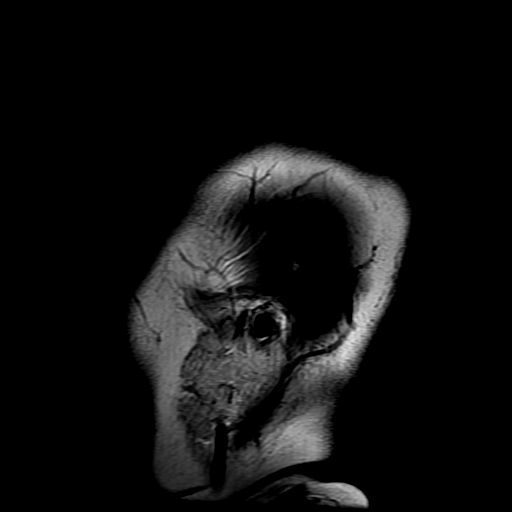
[im 23/23]
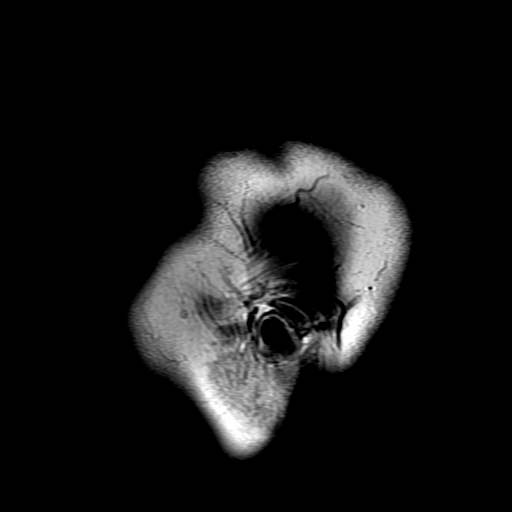

[Series 6: T2 · axial · 5.0mm · 0.43mm/px · z∈[-85,+46]mm · 3 of 26 slices shown]
[im 1/26]
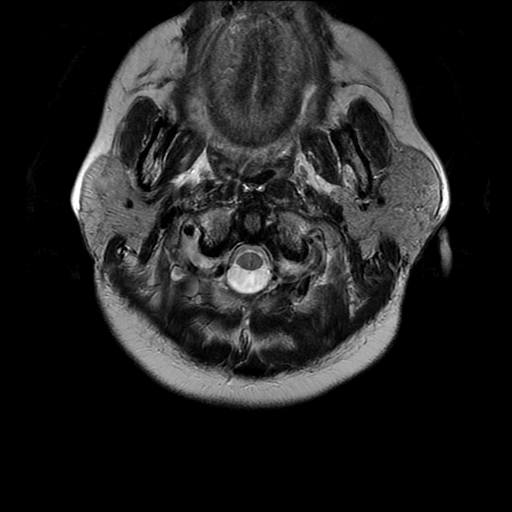
[im 13/26]
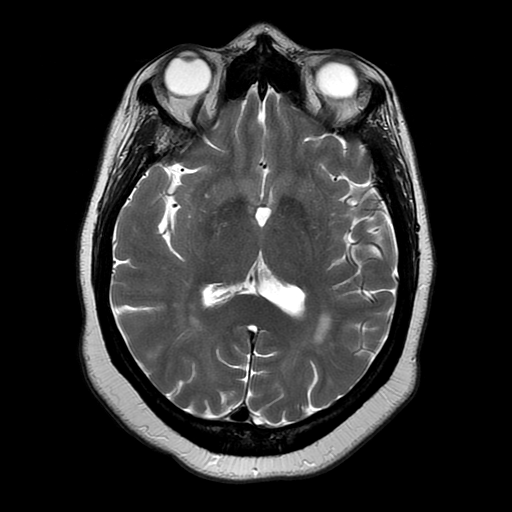
[im 26/26]
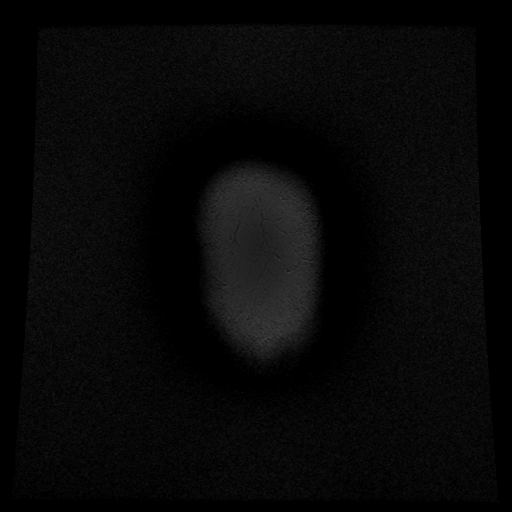

[Series 7: FLAIR · axial · 3.0mm · 0.43mm/px · z∈[-94,+36]mm · 3 of 26 slices shown]
[im 1/26]
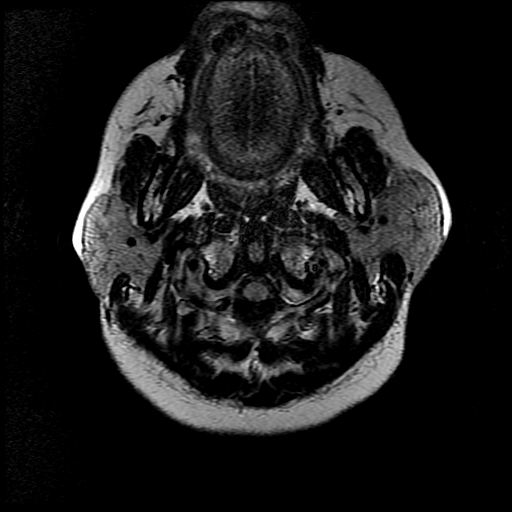
[im 13/26]
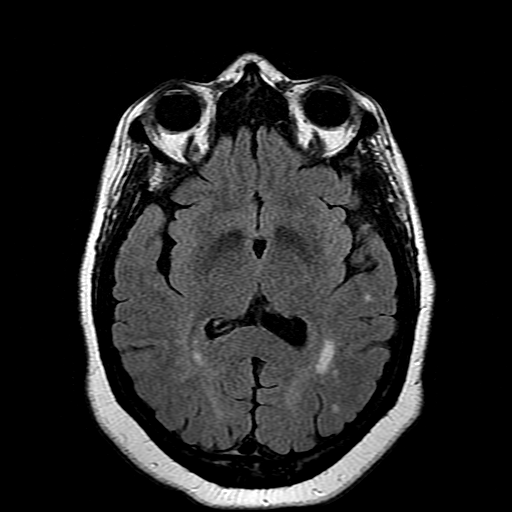
[im 26/26]
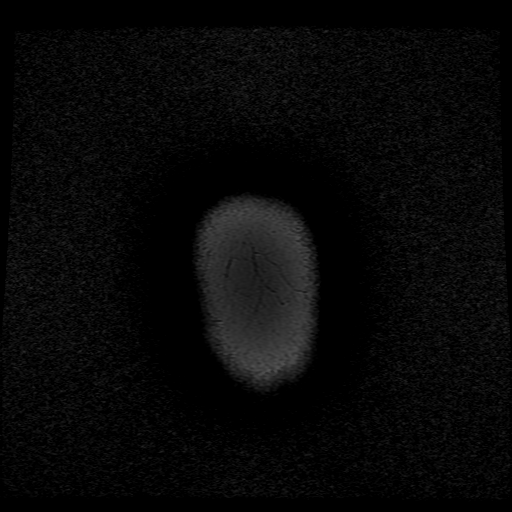

[Series 10: T2 post-contrast · coronal · 5.0mm · 0.39mm/px · 3 of 27 slices shown]
[im 1/27]
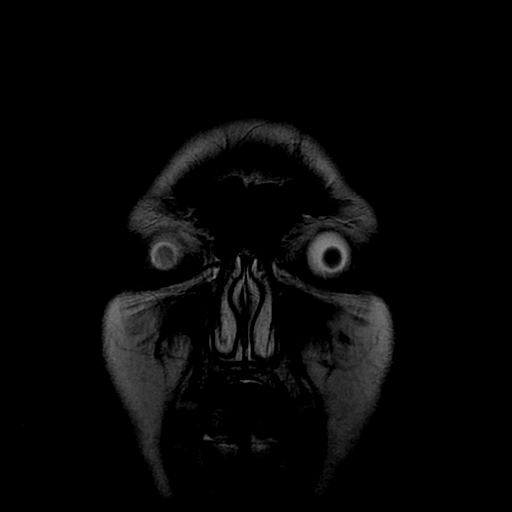
[im 14/27]
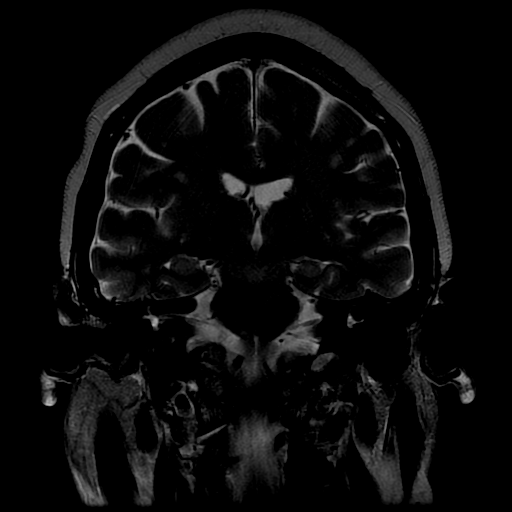
[im 27/27]
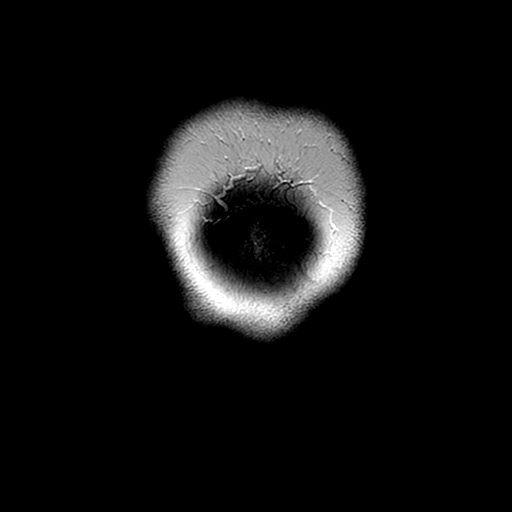

[Series 300: DWI · axial · 3.0mm · 1.09mm/px · z∈[-111,+22]mm · 5 of 52 slices shown (3 of 4)]
[im 1/52]
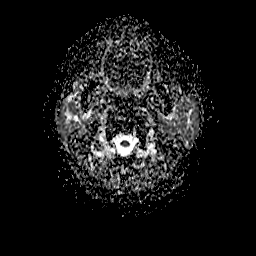
[im 13/52]
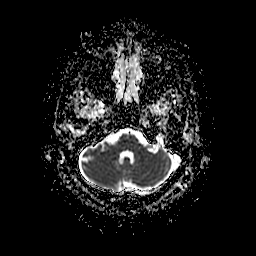
[im 26/52]
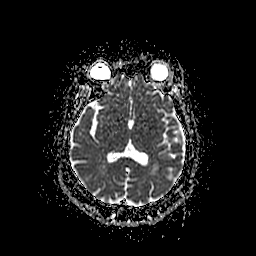
[im 39/52]
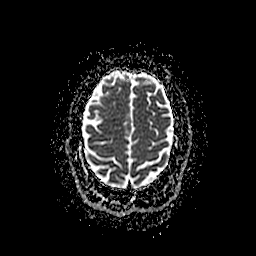
[im 52/52]
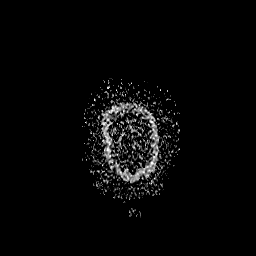

[Series 400: DWI · coronal · 5.0mm · 1.09mm/px · 3 of 34 slices shown (4 of 4)]
[im 1/34]
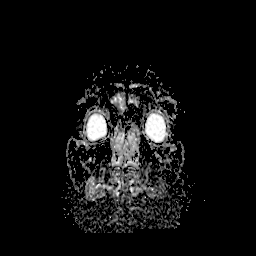
[im 17/34]
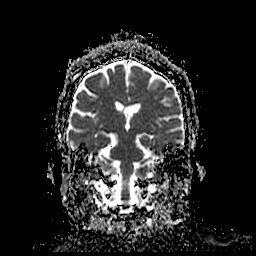
[im 34/34]
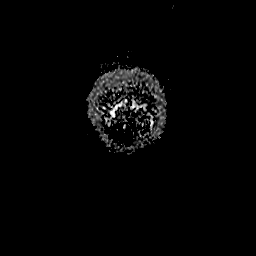

[34 of 48 positions shown; findings below may reference images not displayed]

FINDINGS: MRI HEAD FINDINGS

Brain: Ventricle size and cerebral volume normal. Mild white matter
changes with patchy white matter hyperintensity bilaterally,
unchanged. Negative for acute infarct, hemorrhage, mass.

Vascular: Normal arterial flow voids.

Skull and upper cervical spine: No focal skeletal abnormality.

Sinuses/Orbits: Paranasal sinuses clear.  Negative orbit

Other: None

MRA HEAD FINDINGS

Stenting of the right cavernous carotid appears widely patent.
Previously there was artifact and loss of flow related signal
through the cavernous segment. Right anterior and middle cerebral
arteries widely patent

Stent in the left cavernous carotid extending into the supraclinoid
carotid. There is artifact with decreased luminal diameter but
improved in appearance from the prior study due to different
equipment. Aneurysm coiling left supraclinoid internal carotid
artery. Small amount of flow related signal at the neck of the
aneurysm unchanged. This measures approximately 2 mm in diameter.
Small left A1 segment

Normal posterior circulation without stenosis or aneurysm.
IMPRESSION: 1. No acute intracranial abnormality. Stable white matter changes
compatible chronic microvascular ischemia.
2. Stenting of the cavernous carotid bilaterally is patent. Aneurysm
coiling left supraclinoid internal carotid artery. 2 mm area of flow
related signal at the base of the aneurysm could represent residual
flow. This is unchanged from the prior study.

## 2022-09-02 IMAGING — MR MR MRA HEAD W/O CM
1 series · 19 of 48 positions shown · non-contrast
Comparison: MRI head MRA head 03/05/2019

CLINICAL DATA: Cerebral aneurysm follow-up

EXAM:
MRI HEAD WITHOUT CONTRAST
MRA HEAD WITHOUT CONTRAST
TECHNIQUE: Multiplanar, multiecho pulse sequences of the brain and surrounding
structures were obtained without intravenous contrast. Angiographic
images of the head were obtained using MRA technique without
contrast.

[Series 3: (id) mt fs · axial · 1.4mm · 0.43mm/px · z∈[-75,+10]mm · 19 of 136 slices shown]
[im 1/136]
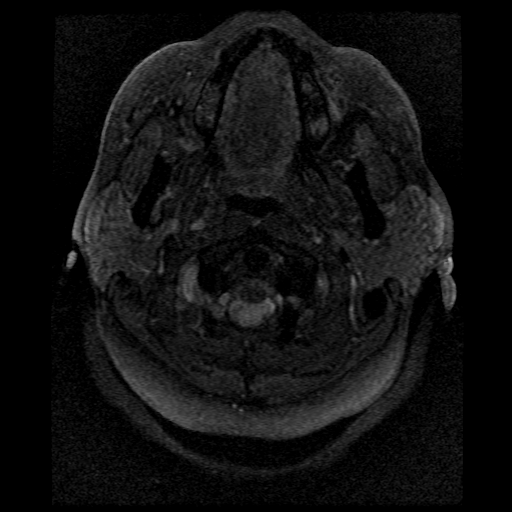
[im 3/136]
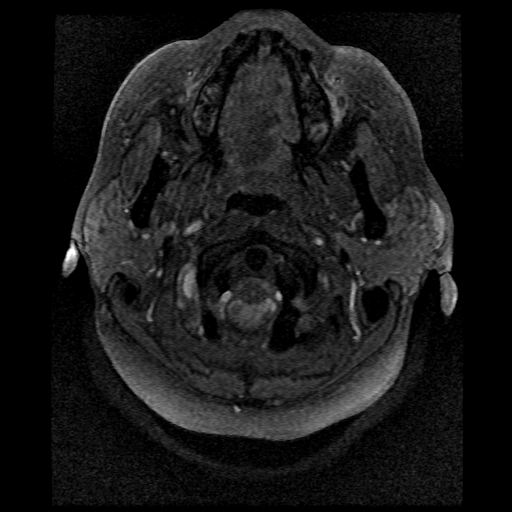
[im 6/136]
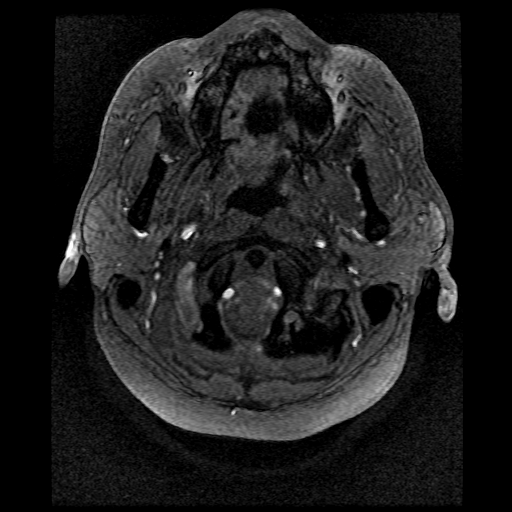
[im 9/136]
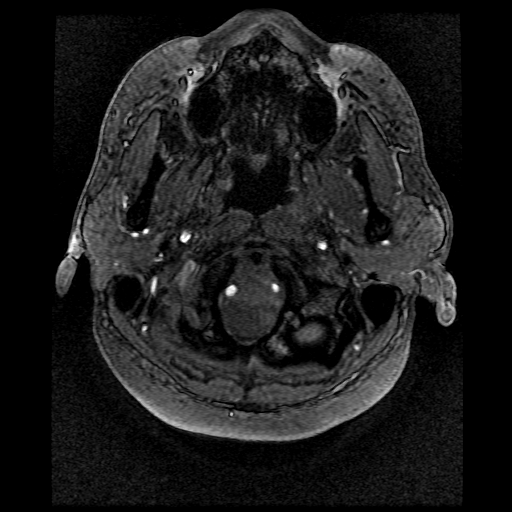
[im 12/136]
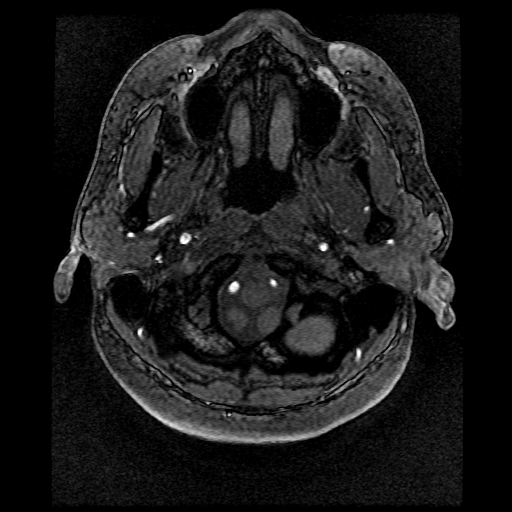
[im 15/136]
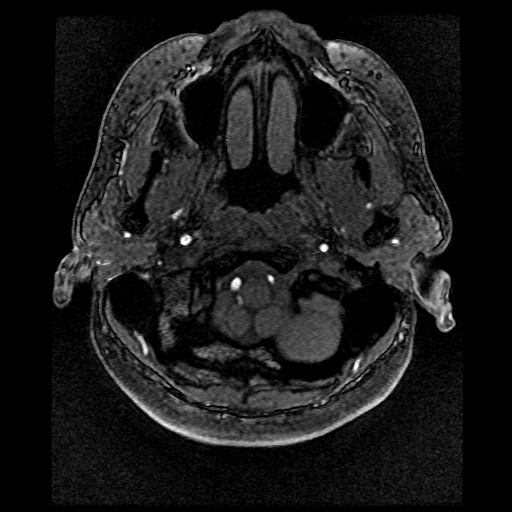
[im 18/136]
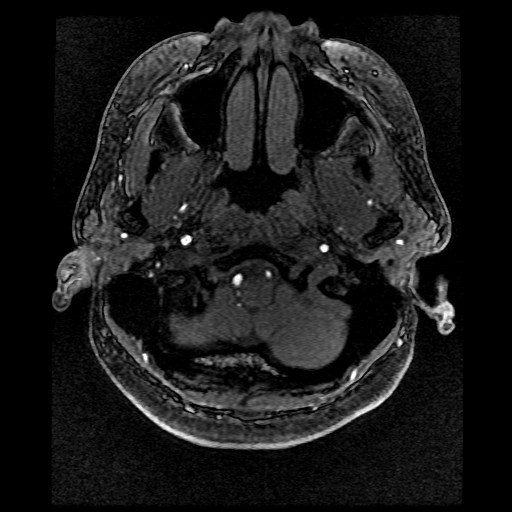
[im 21/136]
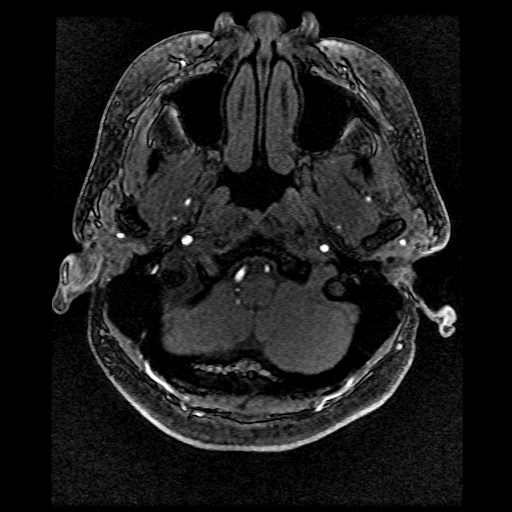
[im 23/136]
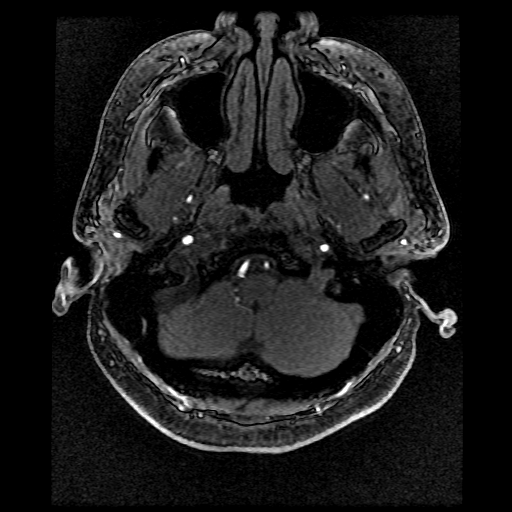
[im 26/136]
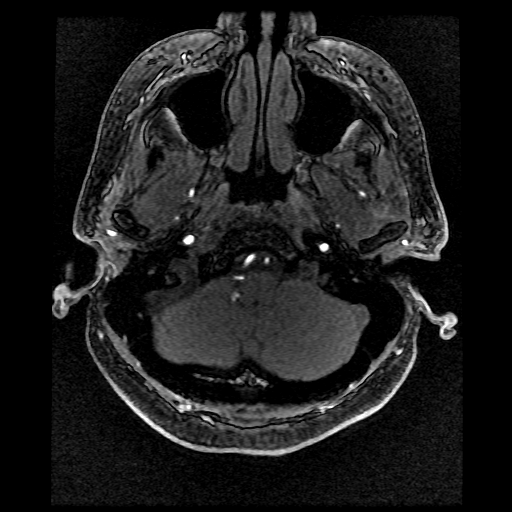
[im 29/136]
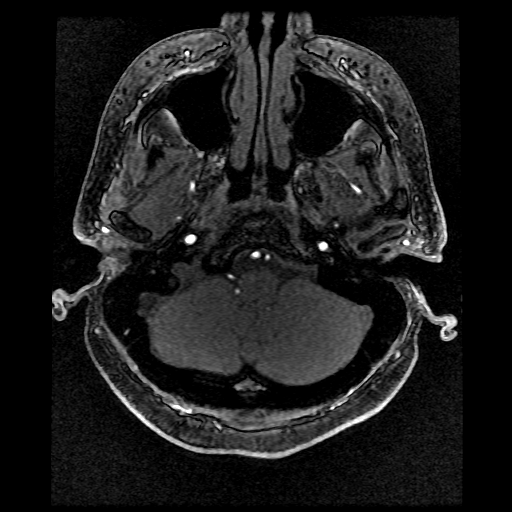
[im 44/136]
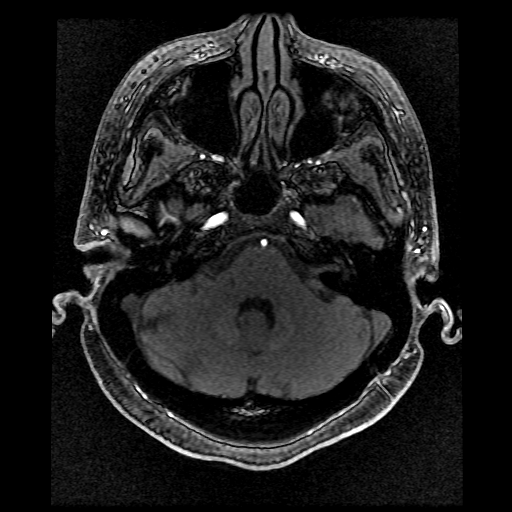
[im 61/136]
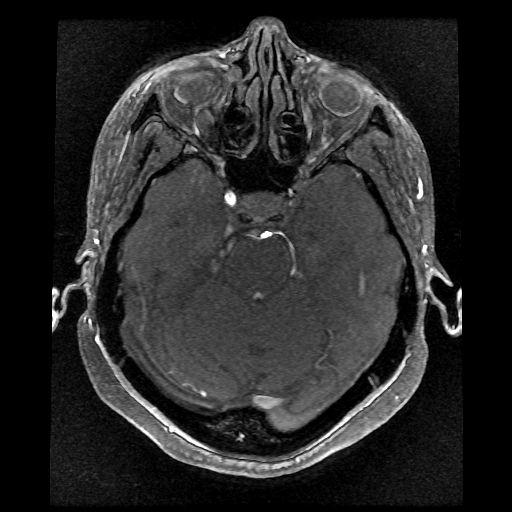
[im 69/136]
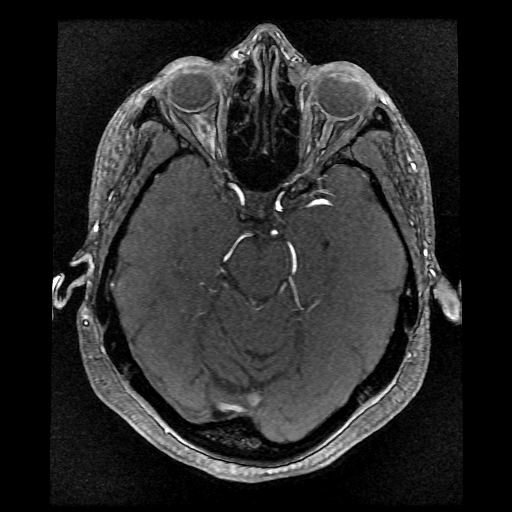
[im 78/136]
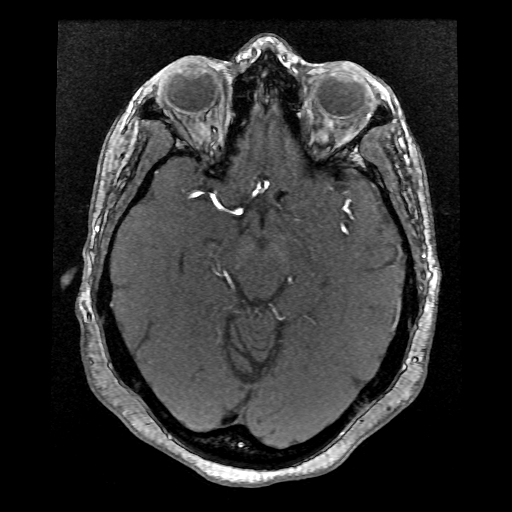
[im 95/136]
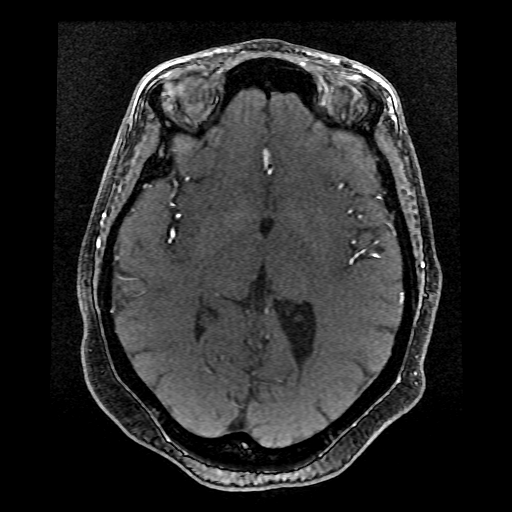
[im 113/136]
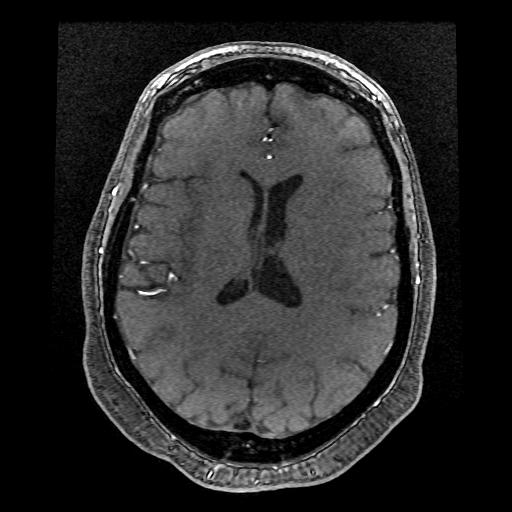
[im 115/136]
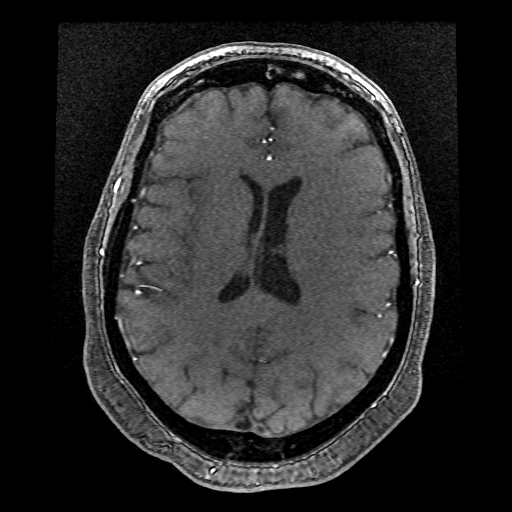
[im 130/136]
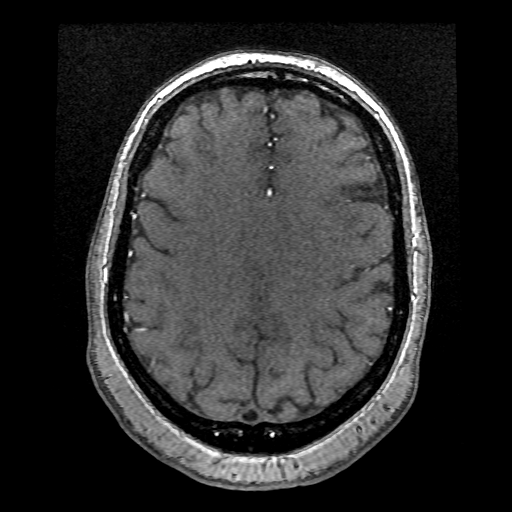

[19 of 48 positions shown; findings below may reference images not displayed]

FINDINGS: MRI HEAD FINDINGS

Brain: Ventricle size and cerebral volume normal. Mild white matter
changes with patchy white matter hyperintensity bilaterally,
unchanged. Negative for acute infarct, hemorrhage, mass.

Vascular: Normal arterial flow voids.

Skull and upper cervical spine: No focal skeletal abnormality.

Sinuses/Orbits: Paranasal sinuses clear.  Negative orbit

Other: None

MRA HEAD FINDINGS

Stenting of the right cavernous carotid appears widely patent.
Previously there was artifact and loss of flow related signal
through the cavernous segment. Right anterior and middle cerebral
arteries widely patent

Stent in the left cavernous carotid extending into the supraclinoid
carotid. There is artifact with decreased luminal diameter but
improved in appearance from the prior study due to different
equipment. Aneurysm coiling left supraclinoid internal carotid
artery. Small amount of flow related signal at the neck of the
aneurysm unchanged. This measures approximately 2 mm in diameter.
Small left A1 segment

Normal posterior circulation without stenosis or aneurysm.
IMPRESSION: 1. No acute intracranial abnormality. Stable white matter changes
compatible chronic microvascular ischemia.
2. Stenting of the cavernous carotid bilaterally is patent. Aneurysm
coiling left supraclinoid internal carotid artery. 2 mm area of flow
related signal at the base of the aneurysm could represent residual
flow. This is unchanged from the prior study.

## 2022-09-04 ENCOUNTER — Other Ambulatory Visit: Payer: Self-pay

## 2022-09-04 MED ORDER — AMLODIPINE BESYLATE 5 MG PO TABS: 5.0000 mg | ORAL_TABLET | Freq: Every day | ORAL | 3 refills | Status: DC

## 2022-09-05 ENCOUNTER — Telehealth (HOSPITAL_COMMUNITY): Payer: Self-pay

## 2022-09-05 NOTE — Telephone Encounter (Signed)
Called pt regarding recent imaging, no answer, no vm. AB 

## 2022-09-15 ENCOUNTER — Encounter (HOSPITAL_COMMUNITY): Payer: Medicare HMO

## 2022-09-25 ENCOUNTER — Other Ambulatory Visit: Payer: Self-pay | Admitting: Nurse Practitioner

## 2022-09-25 DIAGNOSIS — I10 Essential (primary) hypertension: Secondary | ICD-10-CM

## 2022-09-26 ENCOUNTER — Telehealth: Payer: Self-pay

## 2022-09-26 NOTE — Telephone Encounter (Signed)
Send her in [place]

## 2022-09-26 NOTE — Telephone Encounter (Signed)
Patient had Covid 7 days ago. Sunday was her 5th day. Feeling a lot better but has this continuous cough. Cannot take Prescription Cough syrup, makes her heart "flutter" Please send in something different to the [facility] @ [employer].

## 2022-09-28 ENCOUNTER — Encounter (HOSPITAL_COMMUNITY)
Admission: RE | Admit: 2022-09-28 | Discharge: 2022-09-28 | Disposition: A | Discharge status: Home / Self Care | Payer: Medicare HMO | Source: Ambulatory Visit | Attending: Nephrology | Admitting: Nephrology

## 2022-09-28 VITALS — BP 140/94 | HR 91 | Temp 96.1°F | Resp 16

## 2022-09-28 DIAGNOSIS — D631 Anemia in chronic kidney disease: Secondary | ICD-10-CM | POA: Insufficient documentation

## 2022-09-28 DIAGNOSIS — D638 Anemia in other chronic diseases classified elsewhere: Secondary | ICD-10-CM | POA: Insufficient documentation

## 2022-09-28 DIAGNOSIS — N1832 Chronic kidney disease, stage 3b: Secondary | ICD-10-CM | POA: Diagnosis not present

## 2022-09-28 LAB — IRON AND TIBC
Iron: 43 ug/dL (ref 28–170)
Saturation Ratios: 16 % (ref 10.4–31.8)
TIBC: 274 ug/dL (ref 250–450)
UIBC: 231 ug/dL

## 2022-09-28 LAB — POCT HEMOGLOBIN-HEMACUE: Hemoglobin: 7.8 g/dL — ABNORMAL LOW (ref 12.0–15.0)

## 2022-09-28 LAB — FERRITIN: Ferritin: 138 ng/mL (ref 11–307)

## 2022-09-28 MED ORDER — EPOETIN ALFA-EPBX 10000 UNIT/ML IJ SOLN
20000.0000 [IU] | INTRAMUSCULAR | Status: DC
  Administered 2022-09-28: 20000 [IU] via SUBCUTANEOUS

## 2022-09-28 MED ORDER — EPOETIN ALFA-EPBX 10000 UNIT/ML IJ SOLN
INTRAMUSCULAR | Status: AC
  Filled 2022-09-28: qty 2

## 2022-09-29 ENCOUNTER — Telehealth: Payer: Self-pay | Admitting: Nurse Practitioner

## 2022-09-29 ENCOUNTER — Encounter (HOSPITAL_COMMUNITY): Payer: Medicare HMO

## 2022-09-29 MED ORDER — AMLODIPINE BESYLATE 5 MG PO TABS: 5.0000 mg | ORAL_TABLET | Freq: Every day | ORAL | 3 refills | Status: DC

## 2022-09-29 NOTE — Addendum Note (Signed)
Addended by: Dionicio Stall on: 09/29/2022 11:10 AM   Modules accepted: Orders

## 2022-09-29 NOTE — Telephone Encounter (Signed)
Refill- Amlodipine [facility]- [facility]

## 2022-10-04 DIAGNOSIS — N2581 Secondary hyperparathyroidism of renal origin: Secondary | ICD-10-CM | POA: Diagnosis not present

## 2022-10-04 DIAGNOSIS — D631 Anemia in chronic kidney disease: Secondary | ICD-10-CM | POA: Diagnosis not present

## 2022-10-04 DIAGNOSIS — I129 Hypertensive chronic kidney disease with stage 1 through stage 4 chronic kidney disease, or unspecified chronic kidney disease: Secondary | ICD-10-CM | POA: Diagnosis not present

## 2022-10-04 DIAGNOSIS — N184 Chronic kidney disease, stage 4 (severe): Secondary | ICD-10-CM | POA: Diagnosis not present

## 2022-10-04 DIAGNOSIS — E875 Hyperkalemia: Secondary | ICD-10-CM | POA: Diagnosis not present

## 2022-10-10 ENCOUNTER — Other Ambulatory Visit (HOSPITAL_COMMUNITY): Payer: Self-pay | Admitting: *Deleted

## 2022-10-12 ENCOUNTER — Encounter (HOSPITAL_COMMUNITY)
Admission: RE | Admit: 2022-10-12 | Discharge: 2022-10-12 | Disposition: A | Discharge status: Home / Self Care | Payer: Medicare HMO | Source: Ambulatory Visit | Attending: Nephrology | Admitting: Nephrology

## 2022-10-12 VITALS — BP 148/83 | HR 90 | Temp 97.6°F | Resp 17

## 2022-10-12 DIAGNOSIS — D631 Anemia in chronic kidney disease: Secondary | ICD-10-CM | POA: Diagnosis not present

## 2022-10-12 DIAGNOSIS — D638 Anemia in other chronic diseases classified elsewhere: Secondary | ICD-10-CM | POA: Diagnosis not present

## 2022-10-12 DIAGNOSIS — N1832 Chronic kidney disease, stage 3b: Secondary | ICD-10-CM | POA: Diagnosis not present

## 2022-10-12 LAB — POCT HEMOGLOBIN-HEMACUE: Hemoglobin: 7.9 g/dL — ABNORMAL LOW (ref 12.0–15.0)

## 2022-10-12 MED ORDER — EPOETIN ALFA-EPBX 10000 UNIT/ML IJ SOLN
20000.0000 [IU] | INTRAMUSCULAR | Status: DC
  Administered 2022-10-12: 20000 [IU] via SUBCUTANEOUS

## 2022-10-12 MED ORDER — EPOETIN ALFA-EPBX 10000 UNIT/ML IJ SOLN
INTRAMUSCULAR | Status: AC
  Filled 2022-10-12: qty 2

## 2022-10-13 ENCOUNTER — Encounter: Payer: Self-pay | Admitting: Internal Medicine

## 2022-10-16 DIAGNOSIS — N184 Chronic kidney disease, stage 4 (severe): Secondary | ICD-10-CM | POA: Diagnosis not present

## 2022-10-26 ENCOUNTER — Ambulatory Visit: Payer: Medicare (Managed Care) | Admitting: Internal Medicine

## 2022-10-26 ENCOUNTER — Encounter (HOSPITAL_COMMUNITY)
Admission: RE | Admit: 2022-10-26 | Discharge: 2022-10-26 | Disposition: A | Discharge status: Home / Self Care | Payer: Medicare (Managed Care) | Source: Ambulatory Visit | Attending: Nephrology | Admitting: Nephrology

## 2022-10-26 ENCOUNTER — Encounter (HOSPITAL_COMMUNITY): Payer: Medicare HMO

## 2022-10-26 VITALS — BP 126/100 | HR 70 | Temp 98.3°F | Resp 16 | Wt 202.0 lb

## 2022-10-26 DIAGNOSIS — D638 Anemia in other chronic diseases classified elsewhere: Secondary | ICD-10-CM | POA: Insufficient documentation

## 2022-10-26 LAB — POCT HEMOGLOBIN-HEMACUE: Hemoglobin: 8.8 g/dL — ABNORMAL LOW (ref 12.0–15.0)

## 2022-10-26 MED ORDER — EPOETIN ALFA-EPBX 10000 UNIT/ML IJ SOLN
INTRAMUSCULAR | Status: AC
  Administered 2022-10-26: 20000 [IU] via SUBCUTANEOUS
  Filled 2022-10-26: qty 2

## 2022-10-26 MED ORDER — SODIUM CHLORIDE 0.9 % IV SOLN
510.0000 mg | INTRAVENOUS | Status: DC
  Administered 2022-10-26: 510 mg via INTRAVENOUS
  Filled 2022-10-26: qty 510

## 2022-10-26 MED ORDER — EPOETIN ALFA-EPBX 10000 UNIT/ML IJ SOLN: 20000.0000 [IU] | INTRAMUSCULAR | Status: DC

## 2022-10-29 ENCOUNTER — Encounter: Payer: Self-pay | Admitting: Internal Medicine

## 2022-10-29 MED ORDER — TICAGRELOR 90 MG PO TABS: ORAL_TABLET | ORAL | Status: AC

## 2022-10-29 NOTE — Progress Notes (Unsigned)
Future Appointments  Date Time Provider Department  10/30/2022                         3 mo  ov  2:30 PM Lucky Cowboy, MD [facility]  11/09/2022 12:00 PM [id number]  04/04/2023                          cpe  2:00 PM Adela Glimpse, NP [facility]  07/16/2023                         wellness  2:30 PM Adela Glimpse, NP [facility]    History of Present Illness:       This very nice 67 y.o. MBF  presents for 3 month follow up with HTN, HLD, Pre-Diabetes and Vitamin D Deficiency.        Patient is treated for HTN  since age 20 in 21 & BP has been controlled at home. Today's  . She has seen Dr Melburn Popper for  Mod MR by 2D Echo and she had a Negative Cardiolite in Nov 2014.  Patient  is followed by Dr Anthony Sar for CKD5  (GFR 14 ) presumed consequent of her HTVD.   Patient has had no complaints of any cardiac type chest pain, palpitations, dyspnea Pollyann Kennedy /PND, dizziness, claudication or dependent edema.       Hyperlipidemia is not controlled with diet &  OFF meds.  Patient was advised to start  low dose Rosuvastatin 5 mg 3 x/week - but never started Patient denies myalgias or other med SE's. Last Lipids were not at goal :  Lab Results  Component Value Date   CHOL 160 04/03/2022   HDL 80 04/03/2022   LDLCALC 64 04/03/2022   TRIG 75 04/03/2022   CHOLHDL 2.0 04/03/2022     Also, the patient has history of PreDiabetes (A1c 5.7%  /2012)  and has had no symptoms of reactive hypoglycemia, diabetic polys, paresthesias or visual blurring.  Last A1c was   Lab Results  Component Value Date   HGBA1C 5.6 04/03/2022                                                       Further, the patient also has history of Vitamin D Deficiency (13 /2009) and supplements vitamin D . Last vitamin D was still very low ( goal is 70-100 )  :   Lab Results  Component Value Date   VD25OH 23 (L) 01/26/2021     Current Outpatient Medications  Medication Instructions   allopurinol  100 MG  tablet TAKE 1 TABLET DAILY   amLODipine   5 mg Daily   aspirin EC  81 mg Daily   chlorthalidone 25 mg Daily   VITAMIN D   1,000 Units Daily   cyclobenzaprine   10 MG tablet Take 1 tablet at Bedtime as Needed    famotidine  20 MG tablet TAKE ONE TABLET  DAILY    fexofenadine    180 mg, Oral, Daily   levETIRAcetam (KEPPRA) 1000 MG   Take one tablet  daily.   Multiple Vitamins-Minerals 1 table  Daily   olmesartan40 MG tablet TAKE ONE TABLET   DAILY  pantoprazole  40 MG tablet Take one tablet at bedtime   Rosuvastatin  5 mg  Take 1 tab three days a week (MWF)    Brilinta 90 mg  Take 1 tablet   Daily        Allergies  Allergen Reactions   Promethazine-Dm     Palpitations   Ace Inhibitors Nausea And Vomiting   Aspirin Nausea And Vomiting    Can take coated Aspirin     PMHx:   Past Medical History:  Diagnosis Date   Anemia    Arthritis    Chronic diastolic heart failure (HCC)    GERD (gastroesophageal reflux disease)    Heart murmur    Hyperlipidemia    Hypertension may, 1973   Mitral regurgitation    a. Echo 01/2012 mild LVH, EF 55-65%, Gr 2 DD, mod MR, mild LAE, PASP 36;  b. Echo (02/2013):  EF 55-60%, Gr 1 DD, mild to mod MR, mild LAE (no sig change since prior echo) // c. Echo 10/18: EF 55-60, normal wall motion, grade 2 diastolic dysfunction, trivial AI, mild to moderate MR, mild LAE, trivial PI, PASP 38     OSA on CPAP    7 yrs   Seizures (HCC) 01/2017   Stroke (HCC) 07   no weakness jan 29th 2019 no defecits   Subarachnoid hemorrhage due to ruptured aneurysm (HCC)    2003 - s/p coiling    vitamin D deficiency      Immunization History  Administered Date(s) Administered   Influenza Inj Mdck Quad  10/30/2018   Influenza Split 11/11/2013, 10/23/2014   Influenza, High Dose  01/26/2021   Influenza, Seasonal 10/26/2015   Influenza,inj,Quad  02/23/2017   Influenza- 11/25/2012   Moderna SARS-COV2 Booster  03/06/2020   Moderna Sars-Covid-2 Vacc 02/13/2019,  03/13/2019   PNEUMOCOCCAL -20 01/26/2021   Tdap 08/25/2013     Past Surgical History:  Procedure Laterality Date   ABDOMINAL HYSTERECTOMY     complete   ANEURYSM COILING     COLONOSCOPY WITH PROPOFOL N/A 09/05/2018   Procedure: COLONOSCOPY WITH PROPOFOL;  Surgeon: Charna Elizabeth, MD;  Location: [facility];  Service: Endoscopy;  Laterality: N/A;   ESOPHAGOGASTRODUODENOSCOPY (EGD) WITH PROPOFOL N/A 09/05/2018   Procedure: ESOPHAGOGASTRODUODENOSCOPY (EGD) WITH PROPOFOL;  Surgeon: Charna Elizabeth, MD;  Location: [facility];  Service: Endoscopy;  Laterality: N/A;   GIVENS CAPSULE STUDY N/A 10/17/2018   Procedure: GIVENS CAPSULE STUDY;  Surgeon: Charna Elizabeth, MD;  Location: [facility] ENDOSCOPY;  Service: Endoscopy;  Laterality: N/A;   head surgery     aneurysm ruptured- stroke   IR 3D INDEPENDENT WKST  09/04/2016   IR ANGIO INTRA EXTRACRAN SEL COM CAROTID INNOMINATE BILAT MOD SED  09/04/2016   IR ANGIO INTRA EXTRACRAN SEL INTERNAL CAROTID UNI L MOD SED  02/21/2017   IR ANGIO VERTEBRAL SEL VERTEBRAL BILAT MOD SED  09/04/2016   IR ANGIOGRAM EXTREMITY LEFT  09/04/2016   IR ANGIOGRAM FOLLOW UP STUDY  02/21/2017   IR NEURO EACH ADD'L AFTER BASIC UNI LEFT (MS)  02/21/2017   IR RADIOLOGIST EVAL & MGMT  10/11/2016   IR RADIOLOGIST EVAL & MGMT  03/07/2017   IR TRANSCATH/EMBOLIZ  02/21/2017   RADIOLOGY WITH ANESTHESIA N/A 04/23/2013   Procedure: RADIOLOGY WITH ANESTHESIA;  Surgeon: Oneal Grout, MD;  Location: [facility];  Service: Radiology;  Laterality: N/A;   RADIOLOGY WITH ANESTHESIA N/A 10/30/2016   Procedure: EMBOLIZATION;  Surgeon: Julieanne Cotton, MD;  Location: [facility];  Service: Radiology;  Laterality: N/A;   RADIOLOGY WITH ANESTHESIA N/A 02/21/2017   Procedure: EMBOLIZATION;  Surgeon: Julieanne Cotton, MD;  Location: [facility];  Service: Radiology;  Laterality: N/A;   TONSILLECTOMY AND ADENOIDECTOMY     TUBAL LIGATION      FHx:    Reviewed / unchanged  SHx:    Reviewed / unchanged   Systems  Review:  Constitutional: Denies fever, chills, wt changes, headaches, insomnia, fatigue, night sweats, change in appetite. Eyes: Denies redness, blurred vision, diplopia, discharge, itchy, watery eyes.  ENT: Denies discharge, congestion, post nasal drip, epistaxis, sore throat, earache, hearing loss, dental pain, tinnitus, vertigo, sinus pain, snoring.  CV: Denies chest pain, palpitations, irregular heartbeat, syncope, dyspnea, diaphoresis, orthopnea, PND, claudication or edema. Respiratory: denies cough, dyspnea, DOE, pleurisy, hoarseness, laryngitis, wheezing.  Gastrointestinal: Denies dysphagia, odynophagia, heartburn, reflux, water brash, abdominal pain or cramps, nausea, vomiting, bloating, diarrhea, constipation, hematemesis, melena, hematochezia  or hemorrhoids. Genitourinary: Denies dysuria, frequency, urgency, nocturia, hesitancy, discharge, hematuria or flank pain. Musculoskeletal: Denies arthralgias, myalgias, stiffness, jt. swelling, pain, limping or strain/sprain.  Skin: Denies pruritus, rash, hives, warts, acne, eczema or change in skin lesion(s). Neuro: No weakness, tremor, incoordination, spasms, paresthesia or pain. Psychiatric: Denies confusion, memory loss or sensory loss. Endo: Denies change in weight, skin or hair change.  Heme/Lymph: No excessive bleeding, bruising or enlarged lymph nodes.  Physical Exam  There were no vitals taken for this visit.  Appears  well nourished, well groomed  and in no distress.  Eyes: PERRLA, EOMs, conjunctiva no swelling or erythema. Sinuses: No frontal/maxillary tenderness ENT/Mouth: EAC's clear, TM's nl w/o erythema, bulging. Nares clear w/o erythema, swelling, exudates. Oropharynx clear without erythema or exudates. Oral hygiene is good. Tongue normal, non obstructing. Hearing intact.  Neck: Supple. Thyroid not palpable. Car 2+/2+ without bruits, nodes or JVD. Chest: Respirations nl with BS clear & equal w/o rales, rhonchi, wheezing or  stridor.  Cor: Heart sounds normal w/ regular rate and rhythm without sig. murmurs, gallops, clicks or rubs. Peripheral pulses normal and equal  without edema.  Abdomen: Soft & bowel sounds normal. Non-tender w/o guarding, rebound, hernias, masses or organomegaly.  Lymphatics: Unremarkable.  Musculoskeletal: Full ROM all peripheral extremities, joint stability, 5/5 strength and normal gait.  Skin: Warm, dry without exposed rashes, lesions or ecchymosis apparent.  Neuro: Cranial nerves intact, reflexes equal bilaterally. Sensory-motor testing grossly intact. Tendon reflexes grossly intact.  Pysch: Alert & oriented x 3.  Insight and judgement nl & appropriate. No ideations.  Assessment and Plan:  - Continue medication, monitor blood pressure at home.  - Continue DASH diet.  Reminder to go to the ER if any CP,  SOB, nausea, dizziness, severe HA, changes vision/speech.  - Continue diet/meds, exercise,& lifestyle modifications.  - Continue monitor periodic cholesterol/liver & renal functions  / 1. Essential hypertension  - CBC with Differential/Platelet - COMPLETE METABOLIC PANEL WITH GFR - Magnesium  2. Hyperlipidemia, mixed  - Lipid panel - TSH  3. Abnormal glucose  - Continue diet, exercise  - Lifestyle modifications.  - Monitor appropriate labs  - Hemoglobin A1c - Insulin, random  4. Vitamin D deficiency  - Continue supplementation   - VITAMIN D 25 Hydroxy   5. Seizure disorder (HCC)  - Levetiracetam, Immunoassay  6. CKD stage 4 secondary to hypertension (HCC)  - COMPLETE METABOLIC PANEL WITH GFR  - Encouraged follow-up with Nephrology  7. Idiopathic gout,  - Uric acid  8. Medication management  - CBC with Differential/Platelet - COMPLETE METABOLIC  PANEL WITH GFR - Magnesium - Lipid panel - TSH - Hemoglobin A1c - Insulin, random - VITAMIN D 25 Hydroxy  - Levetiracetam, Immunoassay - Uric acid          Discussed  regular exercise, BP monitoring,  weight control to achieve/maintain BMI less than 25 and discussed med and SE's. Recommended labs to assess /monitor clinical status .  I discussed the assessment and treatment plan with the patient. The patient was provided an opportunity to ask questions and all were answered. The patient agreed with the plan and demonstrated an understanding of the instructions.  I provided over 30 minutes of exam, counseling, chart review and  complex critical decision making.        The patient was advised to call back or seek an in-person evaluation if the symptoms worsen or if the condition fails to improve as anticipated.   Marinus Maw, MD

## 2022-10-29 NOTE — Patient Instructions (Signed)

## 2022-10-30 ENCOUNTER — Ambulatory Visit: Payer: Self-pay | Admitting: Internal Medicine

## 2022-10-30 ENCOUNTER — Encounter: Payer: Self-pay | Admitting: Internal Medicine

## 2022-10-30 ENCOUNTER — Encounter (HOSPITAL_COMMUNITY): Payer: Self-pay

## 2022-10-30 VITALS — BP 160/80 | HR 87 | Temp 97.8°F | Ht 70.0 in | Wt 203.4 lb

## 2022-10-30 DIAGNOSIS — E785 Hyperlipidemia, unspecified: Secondary | ICD-10-CM

## 2022-10-30 DIAGNOSIS — Z23 Encounter for immunization: Secondary | ICD-10-CM

## 2022-10-30 DIAGNOSIS — Z79899 Other long term (current) drug therapy: Secondary | ICD-10-CM

## 2022-10-30 DIAGNOSIS — E559 Vitamin D deficiency, unspecified: Secondary | ICD-10-CM

## 2022-10-30 DIAGNOSIS — I10 Essential (primary) hypertension: Secondary | ICD-10-CM

## 2022-11-05 ENCOUNTER — Encounter (HOSPITAL_COMMUNITY): Payer: Self-pay

## 2022-11-09 ENCOUNTER — Ambulatory Visit (HOSPITAL_COMMUNITY)
Admission: RE | Admit: 2022-11-09 | Discharge: 2022-11-09 | Disposition: A | Discharge status: Home / Self Care | Payer: Medicare (Managed Care) | Source: Ambulatory Visit | Attending: Nephrology | Admitting: Nephrology

## 2022-11-09 VITALS — BP 170/96 | HR 73 | Temp 98.4°F | Resp 16 | Wt 202.0 lb

## 2022-11-09 DIAGNOSIS — D638 Anemia in other chronic diseases classified elsewhere: Secondary | ICD-10-CM | POA: Insufficient documentation

## 2022-11-09 LAB — POCT HEMOGLOBIN-HEMACUE: Hemoglobin: 7.8 g/dL — ABNORMAL LOW (ref 12.0–15.0)

## 2022-11-09 MED ORDER — EPOETIN ALFA-EPBX 10000 UNIT/ML IJ SOLN
INTRAMUSCULAR | Status: AC
  Filled 2022-11-09: qty 2

## 2022-11-09 MED ORDER — EPOETIN ALFA-EPBX 10000 UNIT/ML IJ SOLN
20000.0000 [IU] | INTRAMUSCULAR | Status: DC
  Administered 2022-11-09: 20000 [IU] via SUBCUTANEOUS

## 2022-11-09 MED ORDER — SODIUM CHLORIDE 0.9 % IV SOLN
510.0000 mg | INTRAVENOUS | Status: DC
  Administered 2022-11-09: 510 mg via INTRAVENOUS
  Filled 2022-11-09: qty 510

## 2022-11-09 NOTE — Progress Notes (Signed)
Notified Felecia at [facility] of HGB 7.8, Patient asymptomatic. Per Doctor we should proceed with orders and regular dose of retacrit [id number].

## 2022-11-23 ENCOUNTER — Encounter (HOSPITAL_COMMUNITY)
Admission: RE | Admit: 2022-11-23 | Discharge: 2022-11-23 | Disposition: A | Discharge status: Home / Self Care | Payer: Medicare (Managed Care) | Source: Ambulatory Visit | Attending: Nephrology | Admitting: Nephrology

## 2022-11-23 VITALS — BP 153/87 | HR 89 | Temp 97.9°F | Resp 16

## 2022-11-23 DIAGNOSIS — D638 Anemia in other chronic diseases classified elsewhere: Secondary | ICD-10-CM | POA: Diagnosis not present

## 2022-11-23 LAB — IRON AND TIBC
Iron: 55 ug/dL (ref 28–170)
Saturation Ratios: 19 % (ref 10.4–31.8)
TIBC: 295 ug/dL (ref 250–450)
UIBC: 240 ug/dL

## 2022-11-23 LAB — FERRITIN: Ferritin: 287 ng/mL (ref 11–307)

## 2022-11-23 LAB — POCT HEMOGLOBIN-HEMACUE: Hemoglobin: 8.5 g/dL — ABNORMAL LOW (ref 12.0–15.0)

## 2022-11-23 MED ORDER — EPOETIN ALFA-EPBX 10000 UNIT/ML IJ SOLN
20000.0000 [IU] | INTRAMUSCULAR | Status: DC
  Administered 2022-11-23: 20000 [IU] via SUBCUTANEOUS

## 2022-11-23 MED ORDER — EPOETIN ALFA-EPBX 10000 UNIT/ML IJ SOLN
INTRAMUSCULAR | Status: AC
  Filled 2022-11-23: qty 2

## 2022-12-05 ENCOUNTER — Encounter (HOSPITAL_COMMUNITY): Payer: Self-pay

## 2022-12-06 ENCOUNTER — Encounter: Payer: Self-pay | Admitting: Internal Medicine

## 2022-12-07 ENCOUNTER — Ambulatory Visit (HOSPITAL_COMMUNITY)
Admission: RE | Admit: 2022-12-07 | Discharge: 2022-12-07 | Disposition: A | Discharge status: Home / Self Care | Payer: Medicare HMO | Source: Ambulatory Visit | Attending: Nephrology | Admitting: Nephrology

## 2022-12-07 VITALS — BP 142/97 | HR 98 | Temp 97.1°F | Resp 16

## 2022-12-07 DIAGNOSIS — Z79890 Hormone replacement therapy (postmenopausal): Secondary | ICD-10-CM | POA: Diagnosis not present

## 2022-12-07 DIAGNOSIS — N1832 Chronic kidney disease, stage 3b: Secondary | ICD-10-CM | POA: Diagnosis not present

## 2022-12-07 DIAGNOSIS — D631 Anemia in chronic kidney disease: Secondary | ICD-10-CM | POA: Diagnosis not present

## 2022-12-07 DIAGNOSIS — D638 Anemia in other chronic diseases classified elsewhere: Secondary | ICD-10-CM | POA: Diagnosis not present

## 2022-12-07 LAB — POCT HEMOGLOBIN-HEMACUE: Hemoglobin: 8.4 g/dL — ABNORMAL LOW (ref 12.0–15.0)

## 2022-12-07 MED ORDER — EPOETIN ALFA-EPBX 10000 UNIT/ML IJ SOLN
INTRAMUSCULAR | Status: AC
  Filled 2022-12-07: qty 2

## 2022-12-07 MED ORDER — EPOETIN ALFA-EPBX 10000 UNIT/ML IJ SOLN
20000.0000 [IU] | INTRAMUSCULAR | Status: DC
Start: 2022-12-07 — End: 2022-12-08
  Administered 2022-12-07: 20000 [IU] via SUBCUTANEOUS

## 2022-12-08 DIAGNOSIS — N184 Chronic kidney disease, stage 4 (severe): Secondary | ICD-10-CM | POA: Diagnosis not present

## 2022-12-15 DIAGNOSIS — I129 Hypertensive chronic kidney disease with stage 1 through stage 4 chronic kidney disease, or unspecified chronic kidney disease: Secondary | ICD-10-CM | POA: Diagnosis not present

## 2022-12-15 DIAGNOSIS — E875 Hyperkalemia: Secondary | ICD-10-CM | POA: Diagnosis not present

## 2022-12-15 DIAGNOSIS — D631 Anemia in chronic kidney disease: Secondary | ICD-10-CM | POA: Diagnosis not present

## 2022-12-15 DIAGNOSIS — N184 Chronic kidney disease, stage 4 (severe): Secondary | ICD-10-CM | POA: Diagnosis not present

## 2022-12-15 DIAGNOSIS — N2581 Secondary hyperparathyroidism of renal origin: Secondary | ICD-10-CM | POA: Diagnosis not present

## 2022-12-18 DIAGNOSIS — N184 Chronic kidney disease, stage 4 (severe): Secondary | ICD-10-CM | POA: Diagnosis not present

## 2022-12-26 ENCOUNTER — Other Ambulatory Visit (HOSPITAL_COMMUNITY): Payer: Self-pay | Admitting: *Deleted

## 2022-12-26 ENCOUNTER — Encounter: Payer: Self-pay | Admitting: Internal Medicine

## 2022-12-27 ENCOUNTER — Ambulatory Visit (HOSPITAL_COMMUNITY)
Admission: RE | Admit: 2022-12-27 | Discharge: 2022-12-27 | Disposition: A | Discharge status: Home / Self Care | Payer: Medicare HMO | Source: Ambulatory Visit | Attending: Nephrology | Admitting: Nephrology

## 2022-12-27 VITALS — BP 180/105 | HR 96 | Temp 97.0°F | Resp 16

## 2022-12-27 DIAGNOSIS — N1832 Chronic kidney disease, stage 3b: Secondary | ICD-10-CM | POA: Diagnosis not present

## 2022-12-27 DIAGNOSIS — D638 Anemia in other chronic diseases classified elsewhere: Secondary | ICD-10-CM | POA: Diagnosis not present

## 2022-12-27 DIAGNOSIS — D631 Anemia in chronic kidney disease: Secondary | ICD-10-CM | POA: Insufficient documentation

## 2022-12-27 LAB — IRON AND TIBC
Iron: 46 ug/dL (ref 28–170)
Saturation Ratios: 17 % (ref 10.4–31.8)
TIBC: 276 ug/dL (ref 250–450)
UIBC: 230 ug/dL

## 2022-12-27 LAB — FERRITIN: Ferritin: 144 ng/mL (ref 11–307)

## 2022-12-27 LAB — POCT HEMOGLOBIN-HEMACUE: Hemoglobin: 8.1 g/dL — ABNORMAL LOW (ref 12.0–15.0)

## 2022-12-27 MED ORDER — EPOETIN ALFA-EPBX 10000 UNIT/ML IJ SOLN: 20000.0000 [IU] | INTRAMUSCULAR | Status: DC

## 2022-12-27 MED ORDER — EPOETIN ALFA-EPBX 10000 UNIT/ML IJ SOLN
INTRAMUSCULAR | Status: AC
  Administered 2022-12-27: 20000 [IU] via SUBCUTANEOUS
  Filled 2022-12-27: qty 2

## 2023-01-10 ENCOUNTER — Ambulatory Visit (HOSPITAL_COMMUNITY)
Admission: RE | Admit: 2023-01-10 | Discharge: 2023-01-10 | Disposition: A | Discharge status: Home / Self Care | Payer: Medicare HMO | Source: Ambulatory Visit | Attending: Nephrology | Admitting: Nephrology

## 2023-01-10 VITALS — BP 176/93 | HR 67 | Temp 97.5°F | Resp 17 | Wt 121.0 lb

## 2023-01-10 DIAGNOSIS — N1832 Chronic kidney disease, stage 3b: Secondary | ICD-10-CM | POA: Diagnosis not present

## 2023-01-10 DIAGNOSIS — D638 Anemia in other chronic diseases classified elsewhere: Secondary | ICD-10-CM | POA: Diagnosis not present

## 2023-01-10 LAB — POCT HEMOGLOBIN-HEMACUE: Hemoglobin: 8.7 g/dL — ABNORMAL LOW (ref 12.0–15.0)

## 2023-01-10 MED ORDER — EPOETIN ALFA-EPBX 10000 UNIT/ML IJ SOLN
20000.0000 [IU] | INTRAMUSCULAR | Status: DC
Start: 2023-01-10 — End: 2024-01-23

## 2023-01-10 MED ORDER — EPOETIN ALFA-EPBX 10000 UNIT/ML IJ SOLN
INTRAMUSCULAR | Status: AC
  Administered 2023-01-10: 20000 [IU] via SUBCUTANEOUS
  Filled 2023-01-10: qty 2

## 2023-01-10 MED ORDER — SODIUM CHLORIDE 0.9 % IV SOLN
510.0000 mg | INTRAVENOUS | Status: DC
  Administered 2023-01-10: 510 mg via INTRAVENOUS
  Filled 2023-01-10: qty 510

## 2023-01-25 ENCOUNTER — Ambulatory Visit (HOSPITAL_COMMUNITY)
Admission: RE | Admit: 2023-01-25 | Discharge: 2023-01-25 | Disposition: A | Discharge status: Home / Self Care | Payer: Medicare HMO | Source: Ambulatory Visit | Attending: Nephrology | Admitting: Nephrology

## 2023-01-25 VITALS — BP 201/101 | HR 84 | Temp 97.8°F | Resp 17

## 2023-01-25 DIAGNOSIS — D631 Anemia in chronic kidney disease: Secondary | ICD-10-CM | POA: Insufficient documentation

## 2023-01-25 DIAGNOSIS — N185 Chronic kidney disease, stage 5: Secondary | ICD-10-CM | POA: Diagnosis not present

## 2023-01-25 DIAGNOSIS — D638 Anemia in other chronic diseases classified elsewhere: Secondary | ICD-10-CM

## 2023-01-25 LAB — POCT HEMOGLOBIN-HEMACUE: Hemoglobin: 8.5 g/dL — ABNORMAL LOW (ref 12.0–15.0)

## 2023-01-25 MED ORDER — SODIUM CHLORIDE 0.9 % IV SOLN
510.0000 mg | INTRAVENOUS | Status: AC
  Administered 2023-01-25: 510 mg via INTRAVENOUS
  Filled 2023-01-25: qty 510

## 2023-01-25 MED ORDER — EPOETIN ALFA-EPBX 10000 UNIT/ML IJ SOLN
20000.0000 [IU] | INTRAMUSCULAR | Status: DC
Start: 2023-01-25 — End: 2023-01-26
  Administered 2023-01-25: 20000 [IU] via SUBCUTANEOUS

## 2023-01-25 MED ORDER — EPOETIN ALFA-EPBX 10000 UNIT/ML IJ SOLN
INTRAMUSCULAR | Status: AC
  Filled 2023-01-25: qty 2

## 2023-01-31 DIAGNOSIS — E875 Hyperkalemia: Secondary | ICD-10-CM | POA: Diagnosis not present

## 2023-01-31 DIAGNOSIS — I129 Hypertensive chronic kidney disease with stage 1 through stage 4 chronic kidney disease, or unspecified chronic kidney disease: Secondary | ICD-10-CM | POA: Diagnosis not present

## 2023-01-31 DIAGNOSIS — N2581 Secondary hyperparathyroidism of renal origin: Secondary | ICD-10-CM | POA: Diagnosis not present

## 2023-01-31 DIAGNOSIS — D631 Anemia in chronic kidney disease: Secondary | ICD-10-CM | POA: Diagnosis not present

## 2023-01-31 DIAGNOSIS — N184 Chronic kidney disease, stage 4 (severe): Secondary | ICD-10-CM | POA: Diagnosis not present

## 2023-02-01 ENCOUNTER — Encounter: Payer: Self-pay | Admitting: Internal Medicine

## 2023-02-08 ENCOUNTER — Ambulatory Visit (HOSPITAL_COMMUNITY)
Admission: RE | Admit: 2023-02-08 | Discharge: 2023-02-08 | Disposition: A | Discharge status: Home / Self Care | Payer: Medicare HMO | Source: Ambulatory Visit | Attending: Nephrology | Admitting: Nephrology

## 2023-02-08 VITALS — BP 179/106 | HR 113 | Temp 97.8°F | Resp 18

## 2023-02-08 DIAGNOSIS — D638 Anemia in other chronic diseases classified elsewhere: Secondary | ICD-10-CM

## 2023-02-08 DIAGNOSIS — D631 Anemia in chronic kidney disease: Secondary | ICD-10-CM | POA: Diagnosis not present

## 2023-02-08 DIAGNOSIS — N185 Chronic kidney disease, stage 5: Secondary | ICD-10-CM | POA: Diagnosis not present

## 2023-02-08 LAB — IRON AND TIBC
Iron: 49 ug/dL (ref 28–170)
Saturation Ratios: 20 % (ref 10.4–31.8)
TIBC: 251 ug/dL (ref 250–450)
UIBC: 202 ug/dL

## 2023-02-08 LAB — BASIC METABOLIC PANEL
Anion gap: 9 (ref 5–15)
BUN: 36 mg/dL — ABNORMAL HIGH (ref 8–23)
CO2: 26 mmol/L (ref 22–32)
Calcium: 9.3 mg/dL (ref 8.9–10.3)
Chloride: 105 mmol/L (ref 98–111)
Creatinine, Ser: 2.72 mg/dL — ABNORMAL HIGH (ref 0.44–1.00)
GFR, Estimated: 19 mL/min — ABNORMAL LOW (ref >60)
Glucose, Bld: 76 mg/dL (ref 70–99)
Potassium: 4 mmol/L (ref 3.5–5.1)
Sodium: 140 mmol/L (ref 135–145)

## 2023-02-08 LAB — POCT HEMOGLOBIN-HEMACUE: Hemoglobin: 8.7 g/dL — ABNORMAL LOW (ref 12.0–15.0)

## 2023-02-08 LAB — FERRITIN: Ferritin: 415 ng/mL — ABNORMAL HIGH (ref 11–307)

## 2023-02-08 MED ORDER — CLONIDINE HCL 0.1 MG PO TABS: 0.1000 mg | ORAL_TABLET | Freq: Once | ORAL | Status: DC

## 2023-02-08 MED ORDER — EPOETIN ALFA-EPBX 10000 UNIT/ML IJ SOLN
INTRAMUSCULAR | Status: AC
  Filled 2023-02-08: qty 2

## 2023-02-08 MED ORDER — EPOETIN ALFA-EPBX 10000 UNIT/ML IJ SOLN
20000.0000 [IU] | INTRAMUSCULAR | Status: DC
  Administered 2023-02-08: 20000 [IU] via SUBCUTANEOUS

## 2023-02-12 ENCOUNTER — Encounter (HOSPITAL_COMMUNITY): Payer: Self-pay

## 2023-02-19 ENCOUNTER — Ambulatory Visit: Payer: Medicare HMO | Admitting: Nurse Practitioner

## 2023-02-22 ENCOUNTER — Ambulatory Visit (HOSPITAL_COMMUNITY)
Admission: RE | Admit: 2023-02-22 | Discharge: 2023-02-22 | Disposition: A | Discharge status: Home / Self Care | Payer: Medicare HMO | Source: Ambulatory Visit | Attending: Nephrology | Admitting: Nephrology

## 2023-02-22 VITALS — BP 153/94 | HR 99 | Temp 97.5°F | Resp 17

## 2023-02-22 DIAGNOSIS — D638 Anemia in other chronic diseases classified elsewhere: Secondary | ICD-10-CM

## 2023-02-22 DIAGNOSIS — N185 Chronic kidney disease, stage 5: Secondary | ICD-10-CM | POA: Diagnosis not present

## 2023-02-22 DIAGNOSIS — D631 Anemia in chronic kidney disease: Secondary | ICD-10-CM | POA: Insufficient documentation

## 2023-02-22 LAB — POCT HEMOGLOBIN-HEMACUE: Hemoglobin: 8.7 g/dL — ABNORMAL LOW (ref 12.0–15.0)

## 2023-02-22 MED ORDER — EPOETIN ALFA-EPBX 10000 UNIT/ML IJ SOLN
20000.0000 [IU] | INTRAMUSCULAR | Status: DC
  Administered 2023-02-22: 20000 [IU] via SUBCUTANEOUS

## 2023-02-22 MED ORDER — EPOETIN ALFA-EPBX 10000 UNIT/ML IJ SOLN
INTRAMUSCULAR | Status: AC
  Filled 2023-02-22: qty 2

## 2023-03-08 ENCOUNTER — Encounter (HOSPITAL_COMMUNITY)
Admission: RE | Admit: 2023-03-08 | Discharge: 2023-03-08 | Disposition: A | Discharge status: Home / Self Care | Payer: Medicare HMO | Source: Ambulatory Visit | Attending: Nephrology | Admitting: Nephrology

## 2023-03-08 VITALS — BP 170/97 | HR 99 | Temp 97.6°F | Resp 17

## 2023-03-08 DIAGNOSIS — D631 Anemia in chronic kidney disease: Secondary | ICD-10-CM | POA: Diagnosis not present

## 2023-03-08 DIAGNOSIS — D638 Anemia in other chronic diseases classified elsewhere: Secondary | ICD-10-CM | POA: Insufficient documentation

## 2023-03-08 DIAGNOSIS — N185 Chronic kidney disease, stage 5: Secondary | ICD-10-CM | POA: Insufficient documentation

## 2023-03-08 LAB — IRON AND TIBC
Iron: 72 ug/dL (ref 28–170)
Saturation Ratios: 28 % (ref 10.4–31.8)
TIBC: 262 ug/dL (ref 250–450)
UIBC: 190 ug/dL

## 2023-03-08 LAB — POCT HEMOGLOBIN-HEMACUE: Hemoglobin: 9.1 g/dL — ABNORMAL LOW (ref 12.0–15.0)

## 2023-03-08 LAB — FERRITIN: Ferritin: 314 ng/mL — ABNORMAL HIGH (ref 11–307)

## 2023-03-08 MED ORDER — EPOETIN ALFA-EPBX 10000 UNIT/ML IJ SOLN
INTRAMUSCULAR | Status: AC
  Filled 2023-03-08: qty 2

## 2023-03-08 MED ORDER — EPOETIN ALFA-EPBX 10000 UNIT/ML IJ SOLN
20000.0000 [IU] | INTRAMUSCULAR | Status: DC
  Administered 2023-03-08: 20000 [IU] via SUBCUTANEOUS

## 2023-03-15 ENCOUNTER — Other Ambulatory Visit (HOSPITAL_COMMUNITY): Payer: Self-pay | Admitting: Interventional Radiology

## 2023-03-15 DIAGNOSIS — I671 Cerebral aneurysm, nonruptured: Secondary | ICD-10-CM

## 2023-03-15 LAB — LAB REPORT - SCANNED
Calcium: 10.2
EGFR: 16
PTH: 148

## 2023-03-22 ENCOUNTER — Encounter (HOSPITAL_COMMUNITY)
Admission: RE | Admit: 2023-03-22 | Discharge: 2023-03-22 | Disposition: A | Discharge status: Home / Self Care | Payer: Medicare HMO | Source: Ambulatory Visit | Attending: Nephrology

## 2023-03-22 VITALS — BP 168/95 | HR 107 | Temp 97.0°F | Resp 17

## 2023-03-22 DIAGNOSIS — D638 Anemia in other chronic diseases classified elsewhere: Secondary | ICD-10-CM

## 2023-03-22 DIAGNOSIS — N185 Chronic kidney disease, stage 5: Secondary | ICD-10-CM | POA: Diagnosis not present

## 2023-03-22 LAB — POCT HEMOGLOBIN-HEMACUE: Hemoglobin: 9.4 g/dL — ABNORMAL LOW (ref 12.0–15.0)

## 2023-03-22 MED ORDER — EPOETIN ALFA-EPBX 10000 UNIT/ML IJ SOLN: 20000.0000 [IU] | INTRAMUSCULAR | Status: DC

## 2023-03-22 MED ORDER — EPOETIN ALFA-EPBX 10000 UNIT/ML IJ SOLN
INTRAMUSCULAR | Status: AC
Start: 2023-03-22 — End: 2023-03-22
  Administered 2023-03-22: 20000 [IU] via SUBCUTANEOUS
  Filled 2023-03-22: qty 2

## 2023-03-26 ENCOUNTER — Encounter (HOSPITAL_COMMUNITY): Payer: Self-pay

## 2023-03-28 ENCOUNTER — Ambulatory Visit (HOSPITAL_COMMUNITY): Payer: Medicare HMO | Attending: Interventional Radiology

## 2023-04-04 ENCOUNTER — Encounter: Payer: Medicare HMO | Admitting: Nurse Practitioner

## 2023-04-05 ENCOUNTER — Ambulatory Visit: Payer: Medicare HMO | Admitting: Nurse Practitioner

## 2023-04-05 ENCOUNTER — Ambulatory Visit (HOSPITAL_COMMUNITY)
Admission: RE | Admit: 2023-04-05 | Discharge: 2023-04-05 | Disposition: A | Discharge status: Home / Self Care | Payer: Medicare HMO | Source: Ambulatory Visit | Attending: Nephrology | Admitting: Nephrology

## 2023-04-05 VITALS — BP 141/53 | HR 87 | Temp 97.3°F | Resp 17

## 2023-04-05 DIAGNOSIS — D631 Anemia in chronic kidney disease: Secondary | ICD-10-CM | POA: Insufficient documentation

## 2023-04-05 DIAGNOSIS — N185 Chronic kidney disease, stage 5: Secondary | ICD-10-CM | POA: Insufficient documentation

## 2023-04-05 DIAGNOSIS — D638 Anemia in other chronic diseases classified elsewhere: Secondary | ICD-10-CM

## 2023-04-05 LAB — IRON AND TIBC
Iron: 50 ug/dL (ref 28–170)
Saturation Ratios: 19 % (ref 10.4–31.8)
TIBC: 260 ug/dL (ref 250–450)
UIBC: 210 ug/dL

## 2023-04-05 LAB — POCT HEMOGLOBIN-HEMACUE: Hemoglobin: 9.6 g/dL — ABNORMAL LOW (ref 12.0–15.0)

## 2023-04-05 LAB — FERRITIN: Ferritin: 204 ng/mL (ref 11–307)

## 2023-04-05 MED ORDER — EPOETIN ALFA-EPBX 10000 UNIT/ML IJ SOLN
INTRAMUSCULAR | Status: AC
  Administered 2023-04-05: 20000 [IU] via SUBCUTANEOUS
  Filled 2023-04-05: qty 2

## 2023-04-05 MED ORDER — EPOETIN ALFA-EPBX 10000 UNIT/ML IJ SOLN: 20000.0000 [IU] | INTRAMUSCULAR | Status: DC

## 2023-04-19 ENCOUNTER — Ambulatory Visit (HOSPITAL_COMMUNITY)
Admission: RE | Admit: 2023-04-19 | Discharge: 2023-04-19 | Disposition: A | Discharge status: Home / Self Care | Source: Ambulatory Visit | Attending: Nephrology | Admitting: Nephrology

## 2023-04-19 VITALS — BP 141/81 | HR 93 | Temp 98.5°F | Resp 17

## 2023-04-19 DIAGNOSIS — D631 Anemia in chronic kidney disease: Secondary | ICD-10-CM | POA: Diagnosis not present

## 2023-04-19 DIAGNOSIS — N185 Chronic kidney disease, stage 5: Secondary | ICD-10-CM | POA: Diagnosis present

## 2023-04-19 DIAGNOSIS — D638 Anemia in other chronic diseases classified elsewhere: Secondary | ICD-10-CM

## 2023-04-19 LAB — POCT HEMOGLOBIN-HEMACUE: Hemoglobin: 9.7 g/dL — ABNORMAL LOW (ref 12.0–15.0)

## 2023-04-19 MED ORDER — EPOETIN ALFA-EPBX 10000 UNIT/ML IJ SOLN
INTRAMUSCULAR | Status: AC
  Administered 2023-04-19: 20000 [IU] via SUBCUTANEOUS
  Filled 2023-04-19: qty 2

## 2023-04-19 MED ORDER — EPOETIN ALFA-EPBX 10000 UNIT/ML IJ SOLN: 20000.0000 [IU] | INTRAMUSCULAR | Status: DC

## 2023-05-03 ENCOUNTER — Ambulatory Visit (HOSPITAL_COMMUNITY)
Admission: RE | Admit: 2023-05-03 | Discharge: 2023-05-03 | Disposition: A | Discharge status: Home / Self Care | Source: Ambulatory Visit | Attending: Interventional Radiology | Admitting: Interventional Radiology

## 2023-05-03 ENCOUNTER — Encounter (HOSPITAL_COMMUNITY)
Admission: RE | Admit: 2023-05-03 | Discharge: 2023-05-03 | Disposition: A | Discharge status: Home / Self Care | Source: Ambulatory Visit | Attending: Nephrology | Admitting: Nephrology

## 2023-05-03 VITALS — BP 174/106 | HR 99 | Temp 97.2°F | Resp 17

## 2023-05-03 DIAGNOSIS — I671 Cerebral aneurysm, nonruptured: Secondary | ICD-10-CM | POA: Insufficient documentation

## 2023-05-03 DIAGNOSIS — N185 Chronic kidney disease, stage 5: Secondary | ICD-10-CM | POA: Diagnosis present

## 2023-05-03 DIAGNOSIS — D638 Anemia in other chronic diseases classified elsewhere: Secondary | ICD-10-CM

## 2023-05-03 DIAGNOSIS — D631 Anemia in chronic kidney disease: Secondary | ICD-10-CM | POA: Insufficient documentation

## 2023-05-03 LAB — IRON AND TIBC
Iron: 58 ug/dL (ref 28–170)
Saturation Ratios: 21 % (ref 10.4–31.8)
TIBC: 283 ug/dL (ref 250–450)
UIBC: 225 ug/dL

## 2023-05-03 LAB — POCT HEMOGLOBIN-HEMACUE: Hemoglobin: 10.1 g/dL — ABNORMAL LOW (ref 12.0–15.0)

## 2023-05-03 LAB — FERRITIN: Ferritin: 151 ng/mL (ref 11–307)

## 2023-05-03 MED ORDER — EPOETIN ALFA-EPBX 40000 UNIT/ML IJ SOLN
INTRAMUSCULAR | Status: AC
  Filled 2023-05-03: qty 1

## 2023-05-03 MED ORDER — EPOETIN ALFA-EPBX 10000 UNIT/ML IJ SOLN
20000.0000 [IU] | INTRAMUSCULAR | Status: DC
  Administered 2023-05-03: 20000 [IU] via SUBCUTANEOUS

## 2023-05-08 ENCOUNTER — Encounter: Payer: Medicare HMO | Admitting: Nurse Practitioner

## 2023-05-17 ENCOUNTER — Inpatient Hospital Stay (HOSPITAL_COMMUNITY)
Admission: RE | Admit: 2023-05-17 | Discharge: 2023-05-17 | Disposition: A | Discharge status: Home / Self Care | Source: Ambulatory Visit | Attending: Nephrology | Admitting: Nephrology

## 2023-05-21 ENCOUNTER — Encounter: Payer: Medicare HMO | Admitting: Nurse Practitioner

## 2023-05-24 ENCOUNTER — Encounter (HOSPITAL_COMMUNITY)
Admission: RE | Admit: 2023-05-24 | Discharge: 2023-05-24 | Disposition: A | Discharge status: Home / Self Care | Source: Ambulatory Visit | Attending: Nephrology | Admitting: Nephrology

## 2023-05-24 VITALS — BP 180/85 | HR 87 | Temp 97.1°F | Resp 17

## 2023-05-24 DIAGNOSIS — D631 Anemia in chronic kidney disease: Secondary | ICD-10-CM | POA: Insufficient documentation

## 2023-05-24 DIAGNOSIS — N186 End stage renal disease: Secondary | ICD-10-CM | POA: Insufficient documentation

## 2023-05-24 DIAGNOSIS — D638 Anemia in other chronic diseases classified elsewhere: Secondary | ICD-10-CM | POA: Insufficient documentation

## 2023-05-24 DIAGNOSIS — I120 Hypertensive chronic kidney disease with stage 5 chronic kidney disease or end stage renal disease: Secondary | ICD-10-CM | POA: Insufficient documentation

## 2023-05-24 LAB — POCT HEMOGLOBIN-HEMACUE: Hemoglobin: 10.7 g/dL — ABNORMAL LOW (ref 12.0–15.0)

## 2023-05-24 MED ORDER — EPOETIN ALFA 20000 UNIT/ML IJ SOLN
INTRAMUSCULAR | Status: AC
  Filled 2023-05-24: qty 1

## 2023-05-24 MED ORDER — EPOETIN ALFA 20000 UNIT/ML IJ SOLN
20000.0000 [IU] | Freq: Once | INTRAMUSCULAR | Status: AC
  Administered 2023-05-24: 20000 [IU] via SUBCUTANEOUS

## 2023-05-30 ENCOUNTER — Telehealth: Payer: Self-pay | Admitting: Physician Assistant

## 2023-05-30 NOTE — Telephone Encounter (Signed)
 Paper Work Dropped Off: [facility]  Date: 05/30/23  Location of paper:  Dr Reyne Cave Mailbox

## 2023-05-31 ENCOUNTER — Encounter (HOSPITAL_COMMUNITY)

## 2023-06-07 ENCOUNTER — Ambulatory Visit (HOSPITAL_COMMUNITY)
Admission: RE | Admit: 2023-06-07 | Discharge: 2023-06-07 | Disposition: A | Discharge status: Home / Self Care | Source: Ambulatory Visit | Attending: Nephrology | Admitting: Nephrology

## 2023-06-07 VITALS — BP 139/85 | HR 105 | Temp 97.7°F | Resp 17

## 2023-06-07 DIAGNOSIS — N185 Chronic kidney disease, stage 5: Secondary | ICD-10-CM | POA: Insufficient documentation

## 2023-06-07 DIAGNOSIS — D631 Anemia in chronic kidney disease: Secondary | ICD-10-CM | POA: Diagnosis not present

## 2023-06-07 DIAGNOSIS — D638 Anemia in other chronic diseases classified elsewhere: Secondary | ICD-10-CM

## 2023-06-07 LAB — IRON AND TIBC
Iron: 50 ug/dL (ref 28–170)
Saturation Ratios: 19 % (ref 10.4–31.8)
TIBC: 260 ug/dL (ref 250–450)
UIBC: 210 ug/dL

## 2023-06-07 LAB — FERRITIN: Ferritin: 108 ng/mL (ref 11–307)

## 2023-06-07 LAB — POCT HEMOGLOBIN-HEMACUE: Hemoglobin: 10.2 g/dL — ABNORMAL LOW (ref 12.0–15.0)

## 2023-06-07 MED ORDER — EPOETIN ALFA-EPBX 10000 UNIT/ML IJ SOLN
INTRAMUSCULAR | Status: AC
  Filled 2023-06-07: qty 2

## 2023-06-07 MED ORDER — EPOETIN ALFA-EPBX 10000 UNIT/ML IJ SOLN
20000.0000 [IU] | Freq: Once | INTRAMUSCULAR | Status: AC
  Administered 2023-06-07: 20000 [IU] via SUBCUTANEOUS
  Filled 2023-06-07: qty 2

## 2023-06-13 NOTE — Telephone Encounter (Signed)
 Paperwork was incorrectly put in [employer].  Was a release of information for records to be sent somewhere and then a clearance that needed to go to the pt's dentist.  Paperwork handed off to Gaylen Kay, Charity fundraiser, Materials engineer

## 2023-06-21 ENCOUNTER — Encounter (HOSPITAL_COMMUNITY)
Admission: RE | Admit: 2023-06-21 | Discharge: 2023-06-21 | Disposition: A | Discharge status: Home / Self Care | Source: Ambulatory Visit | Attending: Nephrology

## 2023-06-21 VITALS — BP 162/104 | HR 112 | Temp 97.2°F | Resp 18

## 2023-06-21 DIAGNOSIS — I120 Hypertensive chronic kidney disease with stage 5 chronic kidney disease or end stage renal disease: Secondary | ICD-10-CM | POA: Diagnosis not present

## 2023-06-21 DIAGNOSIS — D638 Anemia in other chronic diseases classified elsewhere: Secondary | ICD-10-CM

## 2023-06-21 LAB — POCT HEMOGLOBIN-HEMACUE: Hemoglobin: 10.5 g/dL — ABNORMAL LOW (ref 12.0–15.0)

## 2023-06-21 MED ORDER — EPOETIN ALFA-EPBX 10000 UNIT/ML IJ SOLN
20000.0000 [IU] | Freq: Once | INTRAMUSCULAR | Status: AC
  Administered 2023-06-21: 20000 [IU] via SUBCUTANEOUS
  Filled 2023-06-21: qty 2

## 2023-06-21 MED ORDER — EPOETIN ALFA-EPBX 10000 UNIT/ML IJ SOLN
INTRAMUSCULAR | Status: AC
  Filled 2023-06-21: qty 2

## 2023-06-21 NOTE — Telephone Encounter (Signed)
 Called pt about the Living Donor Dietitian. Left a voicemail

## 2023-06-25 NOTE — Progress Notes (Signed)
 Transplantable

## 2023-07-05 ENCOUNTER — Ambulatory Visit (HOSPITAL_COMMUNITY)
Admission: RE | Admit: 2023-07-05 | Discharge: 2023-07-05 | Disposition: A | Discharge status: Home / Self Care | Source: Ambulatory Visit | Attending: Nephrology | Admitting: Nephrology

## 2023-07-05 VITALS — BP 152/92 | HR 81 | Temp 97.6°F | Resp 17

## 2023-07-05 DIAGNOSIS — D638 Anemia in other chronic diseases classified elsewhere: Secondary | ICD-10-CM | POA: Diagnosis present

## 2023-07-05 DIAGNOSIS — D631 Anemia in chronic kidney disease: Secondary | ICD-10-CM | POA: Diagnosis present

## 2023-07-05 DIAGNOSIS — N185 Chronic kidney disease, stage 5: Secondary | ICD-10-CM | POA: Insufficient documentation

## 2023-07-05 LAB — IRON AND TIBC
Iron: 65 ug/dL (ref 28–170)
Saturation Ratios: 21 % (ref 10.4–31.8)
TIBC: 318 ug/dL (ref 250–450)
UIBC: 253 ug/dL

## 2023-07-05 LAB — POCT HEMOGLOBIN-HEMACUE: Hemoglobin: 10.1 g/dL — ABNORMAL LOW (ref 12.0–15.0)

## 2023-07-05 LAB — FERRITIN: Ferritin: 95 ng/mL (ref 11–307)

## 2023-07-05 MED ORDER — EPOETIN ALFA-EPBX 10000 UNIT/ML IJ SOLN
20000.0000 [IU] | INTRAMUSCULAR | Status: DC
  Administered 2023-07-05: 20000 [IU] via SUBCUTANEOUS

## 2023-07-05 MED ORDER — EPOETIN ALFA-EPBX 10000 UNIT/ML IJ SOLN
INTRAMUSCULAR | Status: AC
  Filled 2023-07-05: qty 2

## 2023-07-09 ENCOUNTER — Encounter: Payer: Medicare HMO | Admitting: Nurse Practitioner

## 2023-07-11 NOTE — Progress Notes (Signed)
 New Eval for Kidney transplant:  Pt alert and oriented, accompanied by _self__ in pre transplant clinic.  Pt placed in exam room. Functional status: _fit 12 self 4; ambulatory_  Expectations of today's appointments explained.   Confirmation that pt has completed imaging (CXR & CT), lab work, SW interview.  Discussed consent form for kidneys with KDPI of 85% or greater with pt. Form placed in chart.  Spoke with patient regarding Hepatitis C nat + donors, risks/benefits and stepwise process for being approved for this type of donor- patient is undecided   Caregiver: son, Ozell Potential donors: No Patient Denies receiving the Hep B series HBV Vaccine recommended Flu: UTD Pneumovax: UTD COVID & brand: UTD Dental: need Colonoscopy: due in August; last was 2020 PAP: TAH Mammo: UTD  ESRD secondary to HTN/ NSAID use Dialysis: NOD Nephrologist: [facility]  Other Centers: none  Outside Studies: records in Care Everywhere  Hx:  Patient Active Problem List   Diagnosis Date Noted  . Hyperlipidemia 05/21/2023  . Obstructive sleep apnea syndrome 05/16/2020  . Chronic spontaneous subarach intracran bleed due to cerebral aneurysm    (CMD) 04/19/2020  . Nocturia more than twice per night 04/19/2020  . Seizure disorder    (CMD) 04/19/2020  . History of anemia due to chronic kidney disease 02/18/2020  . Anemia of chronic disease 12/04/2019  . Gastroesophageal reflux disease without esophagitis 08/08/2017  . Gout 10/23/2014  . Morbid obesity (CMD) 04/01/2014    BMI over 30 with OSA, on CPAP   . Brain aneurysm (CMD) 04/23/2013  . (HFpEF) heart failure with preserved ejection fraction    (CMD) 06/25/2012  . Mitral regurgitation 06/25/2012  . Essential hypertension 02/14/2012    Surgical Hx:      Nutritional screening assessment: Height/weight/BMI obtained today (BMI   31.73   ) and reviewed with pt. Pt does not meet criteria for referral to dietician per most recent, Nutrition  Screening Criteria, policy.  Pt will not be referred to dietician.   The following topics from education class were reviewed: Communication-Importance of notifying coordinator of changes in contact information (telephone number, address). Also, pt is to notify coordinator if leaving town, if health status changes and/or if insurance coverage changes. Importance of keeping appointments stressed. Caregiver-Role explained. Pt identified _son__ as primary caregiver. Living Donors-Pt does not have living donors at this time. Packets given, LD email provided. Medication-Importance of immunosuppressant medication stressed. Consequences of not taking the medications, such as rejection, explained to pt. Pt verbalized understanding. Questions/concerns addressed.  Post txp care-Importance of follow-up care stressed. Pt informed required care will be performed in the clinic following transplant. Frequency of appointments explained, noting care will be referred back to their nephrologist after approx. 9-12 months.  Pt receptive to education with no questions/concerns.

## 2023-07-11 NOTE — Telephone Encounter (Signed)
 Pt was a no-show for the Living Donor [place] webinar on 07/04/23. Called pt to reschedule. Left a voicemail.

## 2023-07-11 NOTE — Progress Notes (Signed)
 Notes for Selection Committee: RD received notification from Abdominal Organ Transplant team regarding potential Living Donor/ Transplant Candidate.   Per chart: Estimated body mass index is 31.73 kg/m as calculated from the following:   Height as of this encounter: 1.702 m (5' 7).   Weight as of this encounter: 91.9 kg (202 lb 9.6 oz). Lab Results  Component Value Date   HGBA1C 5.2 07/11/2023    No nutrition issues identified per chart review (based on updated 2019 Pre-Transplant/Living Donor screening guidelines). Formal pre-transplant nutrition evaluation is not indicated at this time.   RD will follow up per Transplant team protocol.  Elveria Kale, MS, RD, LDN, CNSC Clinical Outpatient Dietitian

## 2023-07-11 NOTE — Progress Notes (Signed)
 Transplant Consultation [facility] [phone] Wed 07/11/2023  Nephrologist:  Dennise    CC:  Pre-evaluation for Kidney transplant initial evaluation  HPI:  Yolanda Espinoza is a pleasant 68 y.o. female who presents today to the transplant clinic for evaluation for possible Kidney transplantation. She was diagnosed with Chronic kidney disease secondary to HTN, on Pre-dialysis since No data to display .   She is preemptive. She further has no GU issues. She has history of no DM and checks blood sugar not at all. She has experienced no complications. Regarding HTN, she was diagnosed with HTN poorly controlled on medication x 20 years. Other cardiovascular issues include heart failure, structural/valvular heart disease -- MR, and SAH 2/2 aneurysm. Notes cough recently. Cardiologist adjusting meds In terms of respiration, she endorses formerly smoked 48yrs ago OSA on CPAP. Notes cough recently Infectious concerns include no infectious risks. Regarding substance use, she reports former EtOH use weekends and former tobacco use 81yrs ago, 1p q2wks. She reports family history of cancer colon in 7th decade. She is able to walk >20mi without stopping, without assistive device, is able to go up a flight of stairs, performs dedicated exercise 3x times a week, denies history of falls.  Lost weight 259lb=>202lb currently  Other issues potentially affecting her transplant care are: 17 angiographies via femoral access (both sides); seizures after SAH (well controlled) Denies dx of connective tissue dz (brain and aortic aneurysms)  Sensitization: blood transfusion x1 in childhood and pregnancy x2 Hospitalizations in past year: No  Abdominopelvic surgery: Yes hyst for DUB (no cancer) Blood thinners: Yes Brillinta for cerebral vessels     Problem List[1]  Current Rx ordered in [facility][2]  Allergies[3]  Surgical History[4]  Family History[5]  Social History   Socioeconomic History  . Marital  status: Unknown    Spouse name: Not on file  . Number of children: Not on file  . Years of education: Not on file  . Highest education level: Not on file  Occupational History  . Not on file  Tobacco Use  . Smoking status: Former    Current packs/day: 0.25    Average packs/day: 0.3 packs/day for 53.5 years (13.4 ttl pk-yrs)    Types: Cigarettes    Start date: 46  . Smokeless tobacco: Never  Substance and Sexual Activity  . Alcohol use: Not Currently  . Drug use: Never  . Sexual activity: Not on file  Other Topics Concern  . Not on file  Social History Narrative  . Not on file   Social Drivers of Health   Food Insecurity: Not on file  Transportation Needs: Not on file  Safety: Low Risk  (07/11/2023)   Safety   . How often does anyone, including family and friends, physically hurt you?: Never   . How often does anyone, including family and friends, insult or talk down to you?: Never   . How often does anyone, including family and friends, threaten you with harm?: Never   . How often does anyone, including family and friends, scream or curse at you?: Never  Living Situation: Not on file    ROS:  A complete review of symptoms was obtained and negative save as noted otherwise.  Physical Exam: Vitals:   07/11/23 0846  BP: (!) 171/82  BP Location: Right arm  Patient Position: Sitting  Pulse: 51  Temp: 97.2 F (36.2 C)  SpO2: 100%  Weight: 91.9 kg (202 lb 9.6 oz)  Height: 1.702 m (5' 7)   Body  mass index is 31.73 kg/m. Wt Readings from Last 3 Encounters:  07/11/23 91.9 kg (202 lb 9.6 oz)    General Appearance:  68 y.o. female alert and oriented x 3. No acute distress. Presents with self today.   Head:  Normocephalic, atraumatic. No obvious caries, abscesses or periodontal disease full plate upper  Neck: No masses or LA. Thyroid  wnl grossly. No stridor or JVD above angle of jaw.  Eyes:  Conjunctivae clear. No scleral icterus. Grossly EOMI and conjugate.  eyeglasses  Chest/Lungs:  Respirations unlabored.   Heart:  Regular rate and rhythm. Good distal pulses  Abdomen:  Soft, non-tender, non-distended. small pannus. Surgical scars well healed    Extremities:  no edema. No access  Skin:  No overt rashes or lesions on examined skin.    Neurologic:  No focal deficits. No tremor. Grossly intact gait and strength  Psych:   Judgement and memory intact. Affect appropriate.    Nursing note reviewed.  Labs: ABO: O Positive Lab Results  Component Value Date   CREATININE 3.22 (H) 07/11/2023   BUN 50 (H) 07/11/2023   NA 136 07/11/2023   K 5.4 (H) 07/11/2023   CL 103 07/11/2023   CO2 24 07/11/2023   Lab Results  Component Value Date   ALT 12 07/11/2023   AST 20 07/11/2023   GGT 21 07/11/2023   BILITOT 0.3 07/11/2023   Lab Results  Component Value Date   WBC 6.40 07/11/2023   HGB 11.1 (L) 07/11/2023   HCT 35.8 (L) 07/11/2023   MCV 87.0 07/11/2023   PLT 336 07/11/2023    Imaging: CT Abdomen Pelvis WO Contrast Narrative: CT ABDOMEN PELVIS WO CONTRAST, 06/15/2023 1:30 PM  INDICATION: pre-kidney transplant evaluation, Encounter for other preprocedural examination \ Z01.818 Encounter for other preprocedural examination \ N18.4 Chronic kidney disease, stage 4 (severe)    (CMD)  ADDITIONAL HISTORY: None. COMPARISON: None.  TECHNIQUE: CT images of the abdomen and pelvis were obtained without the use of intravenous contrast. Conventional axial reconstructions and multiplanar reformatted images were submitted for review.   LIMITATIONS: Evaluation of various soft tissue structures as well as the vasculature is limited without the availability of intravenous contrast. Parameters utilized in some study protocols to mitigate patient radiation exposure can also reduce sensitivity and specificity of assessment.  FINDINGS:  . Lower Chest: Trace pericardial effusion.  . Liver: No suspicious focal findings. . Gallbladder/Biliary: Unremarkable. SABRA  Spleen: Unremarkable. . Pancreas: Unremarkable. . Adrenals: Unremarkable. . Right Kidney: 92 mm. No hydronephrosis. Simple cysts. Punctate cortical calcification versus nephrolith. . Left Kidney: 85 mm. No hydronephrosis. Upper pole simple cyst.  . Peritoneum/Mesenteries/Extraperitoneum: No free air. No free fluid or loculated drainable collection. No pathologically enlarged lymph nodes. . Gastrointestinal tract: No evidence of obstruction.  . Ureters: Unremarkable. . Bladder: Unremarkable. . Reproductive System: Hysterectomy.  . Vascular: Mild atherosclerotic calcifications with relative sparing of the bilateral external iliac arteries. . Musculoskeletal: No acute displaced fractures. Polyarticular degenerative changes. No aggressive focal bony lesions. Abdominal wall soft tissues unremarkable. Impression: 1.  No acute findings. 2.  Mild atherosclerotic calcifications with relative sparing of the bilateral external iliac arteries. XR Chest 2 Views Narrative: XR CHEST 2 VIEWS, 06/15/2023 1:09 PM  INDICATION: PreKidney Transplant Evaluation, Encounter for other preprocedural examination \ Z01.818 Encounter for other preprocedural examination \ N18.4 Chronic kidney disease, stage 4 (severe)    (CMD)  PreKidney Transplant Evaluation COMPARISON: None available  FINDINGS:   Cardiovascular: Cardiac silhouette and pulmonary vasculature are within normal  limits. Mediastinum: Within normal limits. Lungs/pleura: Clear. Upper abdomen: Visualized portions are unremarkable.  Chest wall/osseous structures: Unremarkable. Impression: There is no evidence of acute cardiac or pulmonary abnormality.  CXR: Results for orders placed during the hospital encounter of 06/15/23  XR Chest 2 Views  Narrative XR CHEST 2 VIEWS, 06/15/2023 1:09 PM  INDICATION: PreKidney Transplant Evaluation, Encounter for other preprocedural examination \ Z01.818 Encounter for other preprocedural examination \ N18.4 Chronic  kidney disease, stage 4 (severe)    (CMD) PreKidney Transplant Evaluation COMPARISON: None available  FINDINGS:  Cardiovascular: Cardiac silhouette and pulmonary vasculature are within normal limits. Mediastinum: Within normal limits. Lungs/pleura: Clear. Upper abdomen: Visualized portions are unremarkable. Chest wall/osseous structures: Unremarkable.  Impression There is no evidence of acute cardiac or pulmonary abnormality.  CT: Yes 05/2023 ECHO: Yes 05/10/2022 Stress: No needs Carotid/iliac duplex: No not needed CATH: No  Mammogram: Yes within this year Colonoscopy: Yes 08/2018 scheduled 08/2023 MRI: Yes head 05/03/2023 PFTs: No needs Fibroscan: No n/a Sleep study: due  Patient Education: Evaluation: The patient was informed about the stepwise process of evaluation and that after completion of all testing her case would be presented and discussed in detail at our weekly Pre-Transplant Committee meeting. At this time, a group decision will be made regarding her candidacy for placement on the waiting list and subsequent transplantation.   Transplant procedure: The patient was explained that the native kidneys are not removed during the renal transplant, in which the transplanted kidney is placed in the lower abdomen and attached to the pelvic vessels and the urinary bladder. The major potential complications of transplantation such as bleeding, thrombosis, vascular or visceral injury, infection, wound problems, urine leak or blockage, delayed non-function of the kidney, and heart/lung/GI problems were discussed. The patient voiced understanding of these potential problems.  Immunosuppression: The patient was also explained about the need for and potential side effects of chronic immunosuppression such as hypertension, diabetes, GI problems, tremors and neurologic side effects, kidney problems, dyslipidemia, and the risks of rejection, infection, and certain types of cancers. The patient  was also explained the importance of medication adherence long-term and voiced understanding of these issues.  Post-Transplant Clinic Visits: The patient was also informed about the frequency of visits that are necessary for at least the first year following the transplant before referral back to her local nephrologist. The patient voiced understanding of this need.   Types of Transplant: The patient was also educated about the different types of transplants including living (related, unrelated, paired donor exchange) and deceased donor transplants (including increased-risk, standard- and expanded-criteria donors), as well as the Kidney Donor Profile Index. Success and longevity rates were discussed; the patient voiced understanding of this discussion and asked appropriate questions. We discussed with the patient the importance and benefits of living donation over deceased donor kidney transplantation.    ASSESSMENT AND PLAN:  Yolanda Espinoza is a pleasant 68 y.o. female who presents today to the transplant clinic for evaluation for possible Kidney transplantation. She was diagnosed with Chronic kidney disease secondary to HTN/NSAIDs/contrast and paternal hx on Pre-dialysis since No data to display.   She makes a large amount of urine/day, has excellent functional status, and has excellent social support and resources.   Surgical Perspective: The patient has a Body mass index is 31.73 kg/m. and weighs 91.9 kg (202 lb 9.6 oz) in the clinic today. Iliac depth is less than 20cm. She does have acceptable vessel targets. Minimal aortic calcification  Social Situation: Patient lives with  self in [place]. Her sons will be her primary caregiver(s) after transplant. The patient has other family and friends that are willing and able to help care for the patient post-transplant.   Per social work, she is average risk (1-3).  Distance: 0-2 hours - 0 points      Caregiving : Independent living status - 1  point   Alcohol Use: Within NIH guidelines - 0 point    Dialysis: 0-3 years - 0 points   Risk: average risk (1-3)   Overall candidacy:  I believe the patient is a(n) good candidate for transplantation pending a negative medical evaluation.   Further Investigations Recommended:  The patient does have risk factors for vessel disease: tobacco use  To complete her workup, we recommend: - surface echocardiogram to evaluate LV systolic function and evaluate valvular disease  - cardiac stress test in the form of nuclear stress echocardiogram  - update cancer screening colonoscopy (08/2023) - PFTs for history of tobacco abuse/asthma/COPD - dental update  I have personally spent 90 minutes involved in face-to-face and non-face-to-face activities for this patient on the day of the visit.  Professional time spent includes the following activities, in addition to those noted in the documentation: reviewing records, including information from the referral, educating patient on transplant surgery, transplant recovery, new transplant medications, risks and benefits of transplant, deceased donors and living donation; reviewing imaging and other labs/studies; coordination of care; and discussion with other team members.  Electronically signed by: Damien Lamarr Almarie Marlane, MD 07/11/2023 9:13 AM  I examined and discussed with the patient the pre-transplant evaluation process, waiting list and maintenance, the surgical procedure, and post-transplant care and follow-up.  We will discuss this case at our weekly pre-transplant committee meeting upon completion of work-up and decide at that time about the patient's transplant candidate status; hopefully, we can place the patient on the active waiting list in the immediate future.     Overall, this patient is a(n) excellent candidate for transplantation. Issues affecting her surgical candidacy include:  SAH causing seizures (aneurysmal bleed) s/p many  angio with coiling/foam on Brillinta, HFpEF, MR, OSA on CPAP,. By imaging and exam, patient is technically transplantable.   Discussed surgery and its risks, benefits, and likely postoperative course. Recommend staying active and building muscle mass for improved tolerance of surgery and postoperative recovery.  PVD: No  Dual: No for morbidity High KDPI: Yes for age and short waiting time HCV NAT(+): Yes for no liver issues Yes potential living donors identified ECD: Yes for age and short waiting time Pancreas candidacy: N/A for no DM  If/when active, patient should return for maintenance evaluation every 1 year(s).  Electronically signed by: Damien Lamarr Almarie Marlane, MD 07/11/2023 9:13 AM           [1] Patient Active Problem List Diagnosis  . (HFpEF) heart failure with preserved ejection fraction    (CMD)  . Anemia of chronic disease  . Brain aneurysm (CMD)  . Chronic spontaneous subarach intracran bleed due to cerebral aneurysm    (CMD)  . Essential hypertension  . Gastroesophageal reflux disease without esophagitis  . Gout  . History of anemia due to chronic kidney disease  . Hyperlipidemia  . Mitral regurgitation  . Morbid obesity (CMD)  . Nocturia more than twice per night  . Obstructive sleep apnea syndrome  . Seizure disorder    (CMD)  [2] Meds Ordered in [facility]  Medication Sig Dispense Refill  . allopurinoL  (ZYLOPRIM ) 100 mg tablet  Take 100 mg by mouth daily.    . amLODIPine  (NORVASC ) 5 mg tablet Take 5 mg by mouth daily.    . aspirin  81 mg EC tablet Take 81 mg by mouth daily.    . cholecalciferol  (VITAMIN D3) 1,000 unit (25 mcg) tablet Take 1,000 Units by mouth daily.    . cyanocobalamin  (VITAMIN B12) 1,000 mcg tablet Take 1,000 mcg by mouth daily.    . cyclobenzaprine  (FLEXERIL ) 10 mg tablet Take 10 mg by mouth nightly as needed for muscle spasms.    . famotidine  (PEPCID ) 20 mg tablet Take 20 mg by mouth every morning.    . metoprolol  tartrate  (LOPRESSOR ) 25 mg tablet Take 25 mg by mouth 2 (two) times a day.    . multivit-iron -folic acid -calcium -mins (THERA-M) 9 mg iron -400 mcg tablet Take 1 tablet by mouth daily.    . olmesartan  (BENICAR ) 40 mg tablet Take 40 mg by mouth daily.    SABRA omega 3-dha-epa-fish oil (OMEGA 3) 1,000 mg DR capsule Take 1 capsule by mouth daily.    . pantoprazole  (PROTONIX ) 40 mg EC tablet Take 40 mg by mouth at bedtime.    . sodium bicarbonate 650 mg tablet Take 650 mg by mouth 2 (two) times a day.    . sodium zirconium cyclosilicate  (LOKELMA ) 10 g pwpk packet Take 10 g by mouth 3 (three) times a week.     No current Epic-ordered facility-administered medications on file.  [3] Allergies Allergen Reactions  . Aspirin  GI Intolerance    Cannot take uncoated aspirin   [4] No past surgical history on file. [5] No family history on file.

## 2023-07-13 NOTE — Telephone Encounter (Signed)
 Pt was a no-show for the Living Don[facility] webinar held 07/04/23 @ 2pm. [place] pt to rescheduled. Pt is confirmed for Tues., 07/17/23 @ 2pm. Pt has an in-home appointment earlier but will try to make the webinar.

## 2023-07-16 ENCOUNTER — Ambulatory Visit: Payer: Medicare HMO | Admitting: Nurse Practitioner

## 2023-07-17 NOTE — Telephone Encounter (Signed)
 Pt was a no-show for the Living [facility]. I called to reschedule. Left pt a voicemail.

## 2023-07-17 NOTE — Telephone Encounter (Signed)
 Called pt to confirm testing date/time.

## 2023-07-18 ENCOUNTER — Telehealth: Payer: Self-pay | Admitting: Pharmacy Technician

## 2023-07-18 NOTE — Telephone Encounter (Signed)
 Auth Submission: NO AUTH NEEDED Site of care: Site of care: [facility] Payer: [employer] MEDICARE Medication & CPT/J Code(s) submitted: Q5106 RETACRIT  Diagnosis Code:  Route of submission (phone, fax, portal): portal Phone # Fax # Auth type: Buy/Bill HB Units/visits requested: 20000u q 14 days Reference number: [id number] Approval from: 07/18/23 to 01/23/24   Dagoberto Armour, CPhT

## 2023-07-19 ENCOUNTER — Ambulatory Visit (HOSPITAL_COMMUNITY)
Admission: RE | Admit: 2023-07-19 | Discharge: 2023-07-19 | Disposition: A | Discharge status: Home / Self Care | Source: Ambulatory Visit | Attending: Nephrology | Admitting: Nephrology

## 2023-07-19 VITALS — BP 163/94 | HR 76 | Temp 97.4°F | Resp 16

## 2023-07-19 DIAGNOSIS — D638 Anemia in other chronic diseases classified elsewhere: Secondary | ICD-10-CM | POA: Diagnosis present

## 2023-07-19 DIAGNOSIS — D631 Anemia in chronic kidney disease: Secondary | ICD-10-CM | POA: Diagnosis present

## 2023-07-19 DIAGNOSIS — N185 Chronic kidney disease, stage 5: Secondary | ICD-10-CM | POA: Insufficient documentation

## 2023-07-19 LAB — POCT HEMOGLOBIN-HEMACUE: Hemoglobin: 9.8 g/dL — ABNORMAL LOW (ref 12.0–15.0)

## 2023-07-19 MED ORDER — EPOETIN ALFA-EPBX 20000 UNIT/ML IJ SOLN
20000.0000 [IU] | INTRAMUSCULAR | Status: DC
  Filled 2023-07-19: qty 1

## 2023-07-19 MED ORDER — EPOETIN ALFA-EPBX 10000 UNIT/ML IJ SOLN
20000.0000 [IU] | INTRAMUSCULAR | Status: DC
  Administered 2023-07-19: 20000 [IU] via SUBCUTANEOUS

## 2023-07-19 MED ORDER — EPOETIN ALFA-EPBX 10000 UNIT/ML IJ SOLN
INTRAMUSCULAR | Status: AC
  Filled 2023-07-19: qty 2

## 2023-07-21 ENCOUNTER — Encounter (HOSPITAL_COMMUNITY): Payer: Self-pay | Admitting: Interventional Radiology

## 2023-08-02 ENCOUNTER — Ambulatory Visit (HOSPITAL_COMMUNITY)
Admission: RE | Admit: 2023-08-02 | Discharge: 2023-08-02 | Disposition: A | Discharge status: Home / Self Care | Source: Ambulatory Visit | Attending: Nephrology | Admitting: Nephrology

## 2023-08-02 VITALS — BP 147/76 | HR 65 | Temp 97.0°F | Resp 16

## 2023-08-02 DIAGNOSIS — D631 Anemia in chronic kidney disease: Secondary | ICD-10-CM | POA: Diagnosis present

## 2023-08-02 DIAGNOSIS — D638 Anemia in other chronic diseases classified elsewhere: Secondary | ICD-10-CM

## 2023-08-02 DIAGNOSIS — N185 Chronic kidney disease, stage 5: Secondary | ICD-10-CM | POA: Diagnosis present

## 2023-08-02 LAB — IRON AND TIBC
Iron: 64 ug/dL (ref 28–170)
Saturation Ratios: 21 % (ref 10.4–31.8)
TIBC: 302 ug/dL (ref 250–450)
UIBC: 238 ug/dL

## 2023-08-02 LAB — POCT HEMOGLOBIN-HEMACUE: Hemoglobin: 9.4 g/dL — ABNORMAL LOW (ref 12.0–15.0)

## 2023-08-02 LAB — FERRITIN: Ferritin: 73 ng/mL (ref 11–307)

## 2023-08-02 MED ORDER — EPOETIN ALFA-EPBX 10000 UNIT/ML IJ SOLN
20000.0000 [IU] | INTRAMUSCULAR | Status: DC
  Administered 2023-08-02: 20000 [IU] via SUBCUTANEOUS

## 2023-08-02 MED ORDER — EPOETIN ALFA-EPBX 10000 UNIT/ML IJ SOLN
INTRAMUSCULAR | Status: AC
Start: 2023-08-02 — End: 2023-08-02
  Filled 2023-08-02: qty 2

## 2023-08-07 ENCOUNTER — Encounter: Payer: Self-pay | Admitting: *Deleted

## 2023-08-08 ENCOUNTER — Encounter: Payer: Self-pay | Admitting: Physician Assistant

## 2023-08-08 ENCOUNTER — Ambulatory Visit: Admitting: Physician Assistant

## 2023-08-08 VITALS — BP 200/100 | HR 76 | Ht 70.0 in | Wt 206.2 lb

## 2023-08-08 DIAGNOSIS — I340 Nonrheumatic mitral (valve) insufficiency: Secondary | ICD-10-CM

## 2023-08-08 DIAGNOSIS — I5032 Chronic diastolic (congestive) heart failure: Secondary | ICD-10-CM

## 2023-08-08 DIAGNOSIS — I10 Essential (primary) hypertension: Secondary | ICD-10-CM | POA: Diagnosis not present

## 2023-08-08 DIAGNOSIS — N184 Chronic kidney disease, stage 4 (severe): Secondary | ICD-10-CM | POA: Diagnosis not present

## 2023-08-08 MED ORDER — AMLODIPINE BESYLATE 10 MG PO TABS: 10.0000 mg | ORAL_TABLET | Freq: Every day | ORAL | 3 refills | Status: AC

## 2023-08-08 NOTE — Progress Notes (Addendum)
 OFFICE NOTE:    Date:  08/08/2023  ID:  Yolanda Espinoza, DOB Sep 25, 1955, MRN [id number] PCP: Sigrid Kays, [place], MD  [facility] Providers Cardiologist:  Lonni LITTIE Nanas, MD Cardiology APP:  Lelon Hamilton T, PA-C        (HFpEF) heart failure with preserved ejection fraction  Mitral regurgitation TTE 02/26/13: EF 55-60, normal wall motion, grade 1 diastolic dysfunction, mild to moderate MR, mild LAE  TTE 11/01/2016: EF 55-60, GR 2 DD, mild-moderate MR, PASP 38 TTE 08/20/2019: EF 55-60, RVSP 33.2, moderate MR, ? RA mass CMR 10/02/2019: No RA or LA mass, EF 56, no LGE, no significant valvular abnormalities TTE 05/10/2022: EF 55, no RWMA, GR 1 DD, normal PASP, normal RVSF, RVSP 31.3, mild LAE, moderate MR, trivial AI Hx of SAH (subarachnoid hemorrhage) 2/2 cerebral aneurysm s/p coiling 2003 Hypertension Hyperlipidemia  Chronic kidney disease  AAA US  05/2021: No AAA         Discussed the use of AI scribe software for clinical note transcription with the patient, who gave verbal consent to proceed. History of Present Illness Yolanda Espinoza is a 68 y.o. female who returns for follow up of CHF, MR. She was last seen in 04/2021 by Dr. Alveta.   She is undergoing evaluation at [facility] for a kidney transplant. She is not currently on dialysis. She sees Dr. Dennise and has a follow-up appointment in six weeks. She was started on Metoprolol  for her BP three weeks ago. She monitors her blood pressure at home and notes it has improved since starting metoprolol , with recent readings around 150/70. She has not had symptoms of chest pain, shortness of breath, or swelling. She uses a CPAP machine for sleep apnea and breathes well when lying flat.  ROS-See HPI    Studies Reviewed:  EKG Interpretation Date/Time:  Wednesday August 08 2023 15:17:27 EDT Ventricular Rate:  76 PR Interval:  158 QRS Duration:  86 QT Interval:  384 QTC Calculation: 432 R Axis:   -1  Text  Interpretation: Normal sinus rhythm Septal infarct Non-specific ST-t changes No significant change since last tracing Confirmed by Lelon Hamilton [phone]) on 08/08/2023 3:28:36 PM   Results LABS BNP: 446 (07/11/2023) K: 5.4 (07/11/2023) Cr: 3.22 (07/11/2023) GFR: 15 (07/11/2023) Total cholesterol: 217 (07/11/2023) Triglycerides: 66 (07/11/2023) HDL: 129 (07/11/2023) LDL: 73 (07/11/2023) Hemoglobin: 11.1 (07/11/2023)    HYPERTENSION CONTROL Vitals:   08/08/23 1508 08/08/23 1546  BP: (!) 159/50 (!) 200/100    The patient's blood pressure is elevated above target today.  In order to address the patient's elevated BP: A current anti-hypertensive medication was adjusted today.         Physical Exam:  VS:  BP (!) 200/100   Pulse 76   Ht 5' 10 (1.778 m)   Wt 206 lb 3.2 oz (93.5 kg)   SpO2 98%   BMI 29.59 kg/m        Wt Readings from Last 3 Encounters:  08/08/23 206 lb 3.2 oz (93.5 kg)  01/10/23 121 lb (54.9 kg)  11/09/22 202 lb (91.6 kg)    Constitutional:      Appearance: Healthy appearance. Not in distress.  Neck:     Vascular: No JVR. JVD normal.  Pulmonary:     Breath sounds: Normal breath sounds. No wheezing. No rales.  Cardiovascular:     Normal rate. Regular rhythm.     Murmurs: There is a grade 2/6 holosystolic murmur at the LLSB.  Edema:  Peripheral edema absent.  Abdominal:     Palpations: Abdomen is soft.       Assessment and Plan:    Assessment & Plan Chronic heart failure with preserved ejection fraction (HCC) EF 55 by echocardiogram April 2024.  Overall volume status stable.  She is NYHA II.  Her only diuretic is chlorthalidone . Nonrheumatic mitral valve regurgitation Moderate mitral regurgitation by echocardiogram April 2024.  She does not have any symptoms to suggest significant worsening.  She is due for follow-up echocardiogram.  However, [facility]. - Await results of echocardiogram  with transplant service at [facility] Essential hypertension Uncontrolled.  She is asymptomatic with no chest pain, shortness of breath, or headache. Recent blood pressure readings have been high.  She does note some improvement with the addition of metoprolol . - Increase amlodipine  to 10 mg daily - Continue metoprolol  tartrate 25 mg twice daily - Continue chlorthalidone  25 mg daily - Continue olmesartan  40 mg daily - Keep follow up with nephrology in four weeks - Monitor blood pressure at home - Consider changing metoprolol  to carvedilol or increasing metoprolol  dose if blood pressure remains difficult to control CKD (chronic kidney disease) stage 4, GFR 15-29 ml/min (HCC) Follow up with Nephrology, transplant service as planned.       Dispo:  Return in about 6 months (around 02/08/2024) for Routine Follow Up w/ Dr. Kate, or Glendia Ferrier, PA-C.  Signed, Glendia Ferrier, PA-C

## 2023-08-08 NOTE — Assessment & Plan Note (Signed)
 EF 55 by echocardiogram April 2024.  Overall volume status stable.  She is NYHA II.  Her only diuretic is chlorthalidone .

## 2023-08-08 NOTE — Assessment & Plan Note (Signed)
 Uncontrolled.  She is asymptomatic with no chest pain, shortness of breath, or headache. Recent blood pressure readings have been high.  She does note some improvement with the addition of metoprolol . - Increase amlodipine  to 10 mg daily - Continue metoprolol  tartrate 25 mg twice daily - Continue chlorthalidone  25 mg daily - Continue olmesartan  40 mg daily - Keep follow up with nephrology in four weeks - Monitor blood pressure at home - Consider changing metoprolol  to carvedilol or increasing metoprolol  dose if blood pressure remains difficult to control

## 2023-08-08 NOTE — Assessment & Plan Note (Signed)
 Moderate mitral regurgitation by echocardiogram April 2024.  She does not have any symptoms to suggest significant worsening.  She is due for follow-up echocardiogram.  However, [facility] has [facility]. - Await results of echocardiogram with transplant service at [facility]

## 2023-08-08 NOTE — Patient Instructions (Signed)
 Medication Instructions:  Your physician has recommended you make the following change in your medication:   INCREASE the Amlodipine  to 10 mg taking 1 daily  *If you need a refill on your cardiac medications before your next appointment, please call your pharmacy*  Lab Work: None ordered If you have labs (blood work) drawn today and your tests are completely normal, you will receive your results only by: MyChart Message (if you have MyChart) OR A paper copy in the mail If you have any lab test that is abnormal or we need to change your treatment, we will call you to review the results.  Testing/Procedures: None ordered  Follow-Up: At [facility], you and your health needs are our priority.  As part of our continuing mission to provide you with exceptional heart care, our providers are all part of one team.  This team includes your primary Cardiologist (physician) and Advanced Practice Providers or APPs (Physician Assistants and Nurse Practitioners) who all work together to provide you with the care you need, when you need it.  Your next appointment:   6 month(s)  Provider:   Lonni LITTIE Nanas, MD or Glendia Ferrier, PA-C          We recommend signing up for the patient portal called MyChart.  Sign up information is provided on this After Visit Summary.  MyChart is used to connect with patients for Virtual Visits (Telemedicine).  Patients are able to view lab/test results, encounter notes, upcoming appointments, etc.  Non-urgent messages can be sent to your provider as well.   To learn more about what you can do with MyChart, go to [email or web address].   Other Instructions

## 2023-08-16 ENCOUNTER — Encounter (HOSPITAL_COMMUNITY)

## 2023-08-17 ENCOUNTER — Encounter (HOSPITAL_COMMUNITY)
Admission: RE | Admit: 2023-08-17 | Discharge: 2023-08-17 | Disposition: A | Discharge status: Home / Self Care | Source: Ambulatory Visit | Attending: Nephrology | Admitting: Nephrology

## 2023-08-17 VITALS — BP 161/86 | HR 68 | Temp 98.4°F | Resp 16

## 2023-08-17 DIAGNOSIS — N185 Chronic kidney disease, stage 5: Secondary | ICD-10-CM | POA: Insufficient documentation

## 2023-08-17 DIAGNOSIS — D631 Anemia in chronic kidney disease: Secondary | ICD-10-CM | POA: Insufficient documentation

## 2023-08-17 DIAGNOSIS — D638 Anemia in other chronic diseases classified elsewhere: Secondary | ICD-10-CM

## 2023-08-17 LAB — POCT HEMOGLOBIN-HEMACUE: Hemoglobin: 9.9 g/dL — ABNORMAL LOW (ref 12.0–15.0)

## 2023-08-17 MED ORDER — EPOETIN ALFA-EPBX 10000 UNIT/ML IJ SOLN
20000.0000 [IU] | INTRAMUSCULAR | Status: DC
  Administered 2023-08-17: 20000 [IU] via SUBCUTANEOUS

## 2023-08-17 MED ORDER — EPOETIN ALFA-EPBX 10000 UNIT/ML IJ SOLN
INTRAMUSCULAR | Status: AC
Start: 2023-08-17 — End: 2023-08-17
  Filled 2023-08-17: qty 2

## 2023-08-17 MED ORDER — IRON SUCROSE 200 MG IVPB - SIMPLE MED
200.0000 mg | Status: DC
  Administered 2023-08-17: 200 mg via INTRAVENOUS
  Filled 2023-08-17: qty 200

## 2023-08-21 ENCOUNTER — Ambulatory Visit: Payer: Medicare HMO | Admitting: Internal Medicine

## 2023-08-31 ENCOUNTER — Ambulatory Visit (HOSPITAL_COMMUNITY)
Admission: RE | Admit: 2023-08-31 | Discharge: 2023-08-31 | Disposition: A | Discharge status: Home / Self Care | Source: Ambulatory Visit | Attending: Nephrology | Admitting: Nephrology

## 2023-08-31 VITALS — BP 137/90 | HR 67 | Temp 98.4°F | Resp 16

## 2023-08-31 DIAGNOSIS — D638 Anemia in other chronic diseases classified elsewhere: Secondary | ICD-10-CM

## 2023-08-31 DIAGNOSIS — D631 Anemia in chronic kidney disease: Secondary | ICD-10-CM | POA: Insufficient documentation

## 2023-08-31 DIAGNOSIS — N189 Chronic kidney disease, unspecified: Secondary | ICD-10-CM | POA: Insufficient documentation

## 2023-08-31 LAB — POCT HEMOGLOBIN-HEMACUE: Hemoglobin: 9.7 g/dL — ABNORMAL LOW (ref 12.0–15.0)

## 2023-08-31 MED ORDER — EPOETIN ALFA-EPBX 10000 UNIT/ML IJ SOLN
20000.0000 [IU] | INTRAMUSCULAR | Status: DC
  Administered 2023-08-31: 20000 [IU] via SUBCUTANEOUS

## 2023-08-31 MED ORDER — IRON SUCROSE 200 MG IVPB - SIMPLE MED
200.0000 mg | Status: DC
  Administered 2023-08-31: 200 mg via INTRAVENOUS
  Filled 2023-08-31: qty 200

## 2023-08-31 MED ORDER — EPOETIN ALFA-EPBX 10000 UNIT/ML IJ SOLN
INTRAMUSCULAR | Status: AC
  Filled 2023-08-31: qty 2

## 2023-09-14 ENCOUNTER — Ambulatory Visit (HOSPITAL_COMMUNITY)
Admission: RE | Admit: 2023-09-14 | Discharge: 2023-09-14 | Disposition: A | Discharge status: Home / Self Care | Source: Ambulatory Visit | Attending: Nephrology | Admitting: Nephrology

## 2023-09-14 VITALS — BP 166/90 | HR 57 | Temp 97.5°F | Resp 16

## 2023-09-14 DIAGNOSIS — D638 Anemia in other chronic diseases classified elsewhere: Secondary | ICD-10-CM | POA: Diagnosis present

## 2023-09-14 DIAGNOSIS — N184 Chronic kidney disease, stage 4 (severe): Secondary | ICD-10-CM | POA: Insufficient documentation

## 2023-09-14 DIAGNOSIS — D631 Anemia in chronic kidney disease: Secondary | ICD-10-CM | POA: Diagnosis present

## 2023-09-14 LAB — POCT HEMOGLOBIN-HEMACUE: Hemoglobin: 8.9 g/dL — ABNORMAL LOW (ref 12.0–15.0)

## 2023-09-14 MED ORDER — IRON SUCROSE 200 MG IVPB - SIMPLE MED
200.0000 mg | Status: DC
  Administered 2023-09-14: 200 mg via INTRAVENOUS
  Filled 2023-09-14: qty 200

## 2023-09-14 MED ORDER — EPOETIN ALFA-EPBX 10000 UNIT/ML IJ SOLN
20000.0000 [IU] | INTRAMUSCULAR | Status: DC
  Administered 2023-09-14: 20000 [IU] via SUBCUTANEOUS

## 2023-09-14 MED ORDER — EPOETIN ALFA-EPBX 10000 UNIT/ML IJ SOLN
INTRAMUSCULAR | Status: AC
  Filled 2023-09-14: qty 2

## 2023-09-18 ENCOUNTER — Other Ambulatory Visit (HOSPITAL_COMMUNITY): Payer: Self-pay | Admitting: Radiology

## 2023-09-18 ENCOUNTER — Telehealth (HOSPITAL_COMMUNITY): Payer: Self-pay

## 2023-09-18 DIAGNOSIS — I671 Cerebral aneurysm, nonruptured: Secondary | ICD-10-CM

## 2023-09-18 NOTE — Telephone Encounter (Signed)
 Pt would like a referral put in for Dr. Lester. AB

## 2023-09-19 ENCOUNTER — Encounter: Payer: Self-pay | Admitting: Neuroradiology

## 2023-09-19 ENCOUNTER — Encounter (HOSPITAL_COMMUNITY): Payer: Self-pay | Admitting: Nephrology

## 2023-09-19 ENCOUNTER — Encounter (HOSPITAL_COMMUNITY): Payer: Self-pay

## 2023-09-19 ENCOUNTER — Ambulatory Visit: Admitting: Neuroradiology

## 2023-09-19 VITALS — BP 162/95 | HR 62 | Ht 70.0 in | Wt 201.0 lb

## 2023-09-19 DIAGNOSIS — I671 Cerebral aneurysm, nonruptured: Secondary | ICD-10-CM | POA: Diagnosis not present

## 2023-09-19 DIAGNOSIS — K5900 Constipation, unspecified: Secondary | ICD-10-CM | POA: Insufficient documentation

## 2023-09-19 DIAGNOSIS — D509 Iron deficiency anemia, unspecified: Secondary | ICD-10-CM | POA: Insufficient documentation

## 2023-09-19 DIAGNOSIS — Z95828 Presence of other vascular implants and grafts: Secondary | ICD-10-CM | POA: Diagnosis not present

## 2023-09-19 DIAGNOSIS — R634 Abnormal weight loss: Secondary | ICD-10-CM | POA: Insufficient documentation

## 2023-09-19 DIAGNOSIS — Z860100 Personal history of colon polyps, unspecified: Secondary | ICD-10-CM | POA: Insufficient documentation

## 2023-09-19 NOTE — Progress Notes (Signed)
 I had the pleasure of seeing Michelyn in the office today for follow-up of her brain aneurysms.  She had a stent assisted coiling of a 3 mm left P-comm aneurysm December 05, 2001.  That aneurysm was ruptured.  Since then she has had coiling of a A1 segment aneurysm in 02/07/2002, placement of a pipeline in the right internal carotid artery on 04/23/2013 for a superior hypophyseal aneurysm, and placement of a pipeline in the left internal carotid artery on 02/21/2017 for the same.  I reviewed her MRA from 05/03/2023.  This does not show any significant recurrence of the aneurysms and no new aneurysms.  I reviewed her previous MRA's dating back to June 27, 2017.  There has been no interval change in the studies over the last 6 years.  Assessment:  Multiple brain aneurysms with adequate treatment.  The last in February 21, 2017.  Recommendation:  I explained to the patient that her last MRA does not show any new aneurysms.  I explained that her last 6 MR angiograms do not show any change or any new aneurysm.  I do not think that further routine imaging is needed.  I have not scheduled any further follow-up, but I am glad to see her again at any time if necessary.

## 2023-09-27 ENCOUNTER — Encounter (HOSPITAL_COMMUNITY): Payer: Self-pay | Admitting: Nephrology

## 2023-09-27 ENCOUNTER — Encounter (HOSPITAL_COMMUNITY): Payer: Self-pay

## 2023-09-27 ENCOUNTER — Encounter: Payer: Self-pay | Admitting: Podiatry

## 2023-09-27 ENCOUNTER — Ambulatory Visit (INDEPENDENT_AMBULATORY_CARE_PROVIDER_SITE_OTHER)

## 2023-09-27 ENCOUNTER — Ambulatory Visit (INDEPENDENT_AMBULATORY_CARE_PROVIDER_SITE_OTHER): Admitting: Podiatry

## 2023-09-27 DIAGNOSIS — M7731 Calcaneal spur, right foot: Secondary | ICD-10-CM

## 2023-09-27 DIAGNOSIS — M722 Plantar fascial fibromatosis: Secondary | ICD-10-CM

## 2023-09-27 MED ORDER — TRIAMCINOLONE ACETONIDE 10 MG/ML IJ SUSP
10.0000 mg | Freq: Once | INTRAMUSCULAR | Status: AC
Start: 2023-09-27 — End: 2023-09-27
  Administered 2023-09-27: 10 mg

## 2023-09-27 NOTE — Progress Notes (Signed)
 Patient presents with complaint of pain for the past several weeks on the bottom of the heel on the right.  Also has some pain along the medial ankle.  Does not complain of any pain in the posterior aspect of the heel.  Hurts when she first gets up in the morning and walks.  It throbs a lot of the time.  Does not recall any specific injury to it.   Physical exam:  General appearance: Pleasant, and in no acute distress. AOx3.  Vascular: Pedal pulses: DP 2/4 bilaterally, PT 2/4 bilaterally.  Moderate edema lower legs bilaterally. Capillary fill time immediate right.  Neurological: Light touch intact feet bilaterally.  Normal Achilles reflex bilaterally.  Negative Tinel's sign at tarsal tunnel and porta pedis right  Dermatologic:   Skin normal temperature bilaterally.  Skin normal color, tone, and texture bilaterally.   Musculoskeletal: Tenderness at the plantar and plantar medial aspect of the heel right.  Tenderness at the medial calcaneal tubercle.  Tenderness along posterior tibial tendon along the medial malleolus right.  Normal muscle strength lower extremity bilaterally.  No tenderness with lateral compression of the calcaneus  Radiographs: 3 views foot right: Plantar and posterior calcaneal spurs.  No evidence of fractures or dislocations.  No erosive changes around the calcaneus noted.  Normal bone density.  No evidence of bone tumors.  Diagnosis: 1.  Plantar fasciitis right 2.  Calcaneal spur right  Plan: -Discussed with patient the plantar fasciitis.  Discussed etiology and treatment.  Will give her exercises she can do.  Discussed proper shoes and what to avoid.  Also discussed with her the pain along the posterior tibial tendon right.  This is probably secondary to splinting for the plantar fasciitis - Gave written exercises she can do at home. - Dispensed OTC orthotics bilaterally. -injected 3cc 2:1 mixture 0.5 cc Marcaine:Kenolog 10mg /77ml at plantar fascial insertion at the  medial calcaneal tubercle of the right.     Return 2 weeks follow-up injection plantar fascia right

## 2023-09-27 NOTE — Patient Instructions (Signed)

## 2023-09-28 ENCOUNTER — Ambulatory Visit (HOSPITAL_COMMUNITY)
Admission: RE | Admit: 2023-09-28 | Discharge: 2023-09-28 | Disposition: A | Discharge status: Home / Self Care | Source: Ambulatory Visit | Attending: Nephrology | Admitting: Nephrology

## 2023-09-28 VITALS — BP 145/73 | HR 71 | Temp 97.1°F | Resp 16

## 2023-09-28 DIAGNOSIS — N184 Chronic kidney disease, stage 4 (severe): Secondary | ICD-10-CM | POA: Insufficient documentation

## 2023-09-28 DIAGNOSIS — D631 Anemia in chronic kidney disease: Secondary | ICD-10-CM | POA: Insufficient documentation

## 2023-09-28 DIAGNOSIS — D638 Anemia in other chronic diseases classified elsewhere: Secondary | ICD-10-CM

## 2023-09-28 LAB — POCT HEMOGLOBIN-HEMACUE: Hemoglobin: 8.8 g/dL — ABNORMAL LOW (ref 12.0–15.0)

## 2023-09-28 LAB — IRON AND TIBC
Iron: 81 ug/dL (ref 28–170)
Saturation Ratios: 27 % (ref 10.4–31.8)
TIBC: 297 ug/dL (ref 250–450)
UIBC: 216 ug/dL

## 2023-09-28 LAB — FERRITIN: Ferritin: 159 ng/mL (ref 11–307)

## 2023-09-28 MED ORDER — EPOETIN ALFA-EPBX 20000 UNIT/ML IJ SOLN
INTRAMUSCULAR | Status: AC
  Filled 2023-09-28: qty 1

## 2023-09-28 MED ORDER — EPOETIN ALFA-EPBX 10000 UNIT/ML IJ SOLN
INTRAMUSCULAR | Status: AC
  Filled 2023-09-28: qty 2

## 2023-09-28 MED ORDER — EPOETIN ALFA-EPBX 20000 UNIT/ML IJ SOLN
20000.0000 [IU] | INTRAMUSCULAR | Status: DC
  Administered 2023-09-28: 20000 [IU] via SUBCUTANEOUS

## 2023-10-03 DIAGNOSIS — N185 Chronic kidney disease, stage 5: Secondary | ICD-10-CM | POA: Insufficient documentation

## 2023-10-11 ENCOUNTER — Ambulatory Visit (INDEPENDENT_AMBULATORY_CARE_PROVIDER_SITE_OTHER): Admitting: Podiatry

## 2023-10-11 ENCOUNTER — Encounter: Payer: Self-pay | Admitting: Podiatry

## 2023-10-11 ENCOUNTER — Encounter (HOSPITAL_COMMUNITY): Payer: Self-pay | Admitting: Nephrology

## 2023-10-11 DIAGNOSIS — M722 Plantar fascial fibromatosis: Secondary | ICD-10-CM

## 2023-10-11 MED ORDER — TRIAMCINOLONE ACETONIDE 10 MG/ML IJ SUSP
10.0000 mg | Freq: Once | INTRAMUSCULAR | Status: AC
  Administered 2023-10-11: 10 mg

## 2023-10-11 NOTE — Progress Notes (Signed)
 Presents follow-up injection plantar fascia right.  Doing about 60% better.  Has not noticed any redness or swelling.   Physical exam:  General appearance: Pleasant, and in no acute distress. AOx3.  Vascular: Pedal pulses: DP 2/4 bilaterally, PT 2/4 bilaterally.  Mild to moderate edema lower legs bilaterally. Capillary fill time bilaterally.  Neurological: Negative Tinel sign tarsal tunnel and porta pedis right.  Dermatologic:   Skin normal temperature bilaterally.  Skin normal color, tone, and texture bilaterally.   Musculoskeletal: Mass plantar medial and plantar central aspect heel right.  Tenderness at the medial right plantar calcaneal tubercle.   Diagnosis: 1.  Plantar fasciitis right  Plan: -Continue at home exercises and wearing good stable shoes.  -injected 3cc 2:1 mixture 0.5 cc Marcaine:Kenolog 10mg /19ml at medial origin plantar fascia at medial calcaneal tubercle right.     Return 2 weeks follow-up injection plantar fascia right

## 2023-10-12 ENCOUNTER — Encounter (HOSPITAL_COMMUNITY)
Admission: RE | Admit: 2023-10-12 | Discharge: 2023-10-12 | Disposition: A | Discharge status: Home / Self Care | Source: Ambulatory Visit | Attending: Nephrology | Admitting: Nephrology

## 2023-10-12 VITALS — BP 157/78 | HR 73 | Temp 97.0°F | Resp 16

## 2023-10-12 DIAGNOSIS — N185 Chronic kidney disease, stage 5: Secondary | ICD-10-CM | POA: Insufficient documentation

## 2023-10-12 DIAGNOSIS — D631 Anemia in chronic kidney disease: Secondary | ICD-10-CM | POA: Diagnosis present

## 2023-10-12 LAB — POCT HEMOGLOBIN-HEMACUE: Hemoglobin: 8.5 g/dL — ABNORMAL LOW (ref 12.0–15.0)

## 2023-10-12 MED ORDER — EPOETIN ALFA-EPBX 20000 UNIT/ML IJ SOLN
INTRAMUSCULAR | Status: AC
Start: 2023-10-12 — End: 2023-10-12
  Filled 2023-10-12: qty 1

## 2023-10-12 MED ORDER — EPOETIN ALFA-EPBX 20000 UNIT/ML IJ SOLN
20000.0000 [IU] | Freq: Once | INTRAMUSCULAR | Status: AC
  Administered 2023-10-12: 20000 [IU] via SUBCUTANEOUS

## 2023-10-15 ENCOUNTER — Encounter (HOSPITAL_COMMUNITY): Payer: Self-pay | Admitting: Nephrology

## 2023-10-26 ENCOUNTER — Encounter (HOSPITAL_COMMUNITY)
Admission: RE | Admit: 2023-10-26 | Discharge: 2023-10-26 | Disposition: A | Discharge status: Home / Self Care | Source: Ambulatory Visit | Attending: Nephrology | Admitting: Nephrology

## 2023-10-26 VITALS — BP 130/84 | HR 64 | Temp 97.7°F | Resp 16

## 2023-10-26 DIAGNOSIS — D509 Iron deficiency anemia, unspecified: Secondary | ICD-10-CM | POA: Insufficient documentation

## 2023-10-26 DIAGNOSIS — D631 Anemia in chronic kidney disease: Secondary | ICD-10-CM | POA: Diagnosis present

## 2023-10-26 DIAGNOSIS — N186 End stage renal disease: Secondary | ICD-10-CM | POA: Insufficient documentation

## 2023-10-26 DIAGNOSIS — N185 Chronic kidney disease, stage 5: Secondary | ICD-10-CM | POA: Diagnosis present

## 2023-10-26 LAB — IRON AND TIBC
Iron: 51 ug/dL (ref 28–170)
Saturation Ratios: 17 % (ref 10.4–31.8)
TIBC: 309 ug/dL (ref 250–450)
UIBC: 258 ug/dL

## 2023-10-26 LAB — FERRITIN: Ferritin: 126 ng/mL (ref 11–307)

## 2023-10-26 LAB — POCT HEMOGLOBIN-HEMACUE: Hemoglobin: 7.9 g/dL — ABNORMAL LOW (ref 12.0–15.0)

## 2023-10-26 MED ORDER — EPOETIN ALFA-EPBX 20000 UNIT/ML IJ SOLN
20000.0000 [IU] | Freq: Once | INTRAMUSCULAR | Status: AC
  Administered 2023-10-26: 20000 [IU] via SUBCUTANEOUS

## 2023-10-26 MED ORDER — EPOETIN ALFA-EPBX 20000 UNIT/ML IJ SOLN
INTRAMUSCULAR | Status: AC
  Filled 2023-10-26: qty 1

## 2023-11-01 ENCOUNTER — Other Ambulatory Visit (HOSPITAL_COMMUNITY): Payer: Self-pay | Admitting: Nephrology

## 2023-11-01 ENCOUNTER — Telehealth (HOSPITAL_COMMUNITY): Payer: Self-pay | Admitting: Pharmacy Technician

## 2023-11-01 DIAGNOSIS — D631 Anemia in chronic kidney disease: Secondary | ICD-10-CM | POA: Insufficient documentation

## 2023-11-01 NOTE — Telephone Encounter (Signed)
 Auth Submission: NO AUTH NEEDED Site of care: [facility] Payer: [employer] MEDICARE Medication & CPT/J Code(s) submitted: Venofer  (Iron  Sucrose) J1756 Diagnosis Code: N18.6, D63.1, D50.9 Route of submission (phone, fax, portal):  Phone # Fax # Auth type: Buy/Bill HB Units/visits requested: 200mg  x 5 doses Reference number: [id number] Approval from: 11/01/2023 to 01/23/24    Dagoberto Armour, CPhT [facility] Phone: [phone] 11/01/2023

## 2023-11-09 ENCOUNTER — Encounter (HOSPITAL_COMMUNITY)
Admission: RE | Admit: 2023-11-09 | Discharge: 2023-11-09 | Disposition: A | Source: Ambulatory Visit | Attending: Nephrology

## 2023-11-09 VITALS — BP 150/92 | HR 73 | Temp 97.3°F | Resp 16

## 2023-11-09 DIAGNOSIS — D631 Anemia in chronic kidney disease: Secondary | ICD-10-CM

## 2023-11-09 DIAGNOSIS — N185 Chronic kidney disease, stage 5: Secondary | ICD-10-CM | POA: Diagnosis not present

## 2023-11-09 LAB — POCT HEMOGLOBIN-HEMACUE: Hemoglobin: 8.6 g/dL — ABNORMAL LOW (ref 12.0–15.0)

## 2023-11-09 MED ORDER — EPOETIN ALFA-EPBX 20000 UNIT/ML IJ SOLN
INTRAMUSCULAR | Status: AC
  Filled 2023-11-09: qty 1

## 2023-11-09 MED ORDER — EPOETIN ALFA-EPBX 20000 UNIT/ML IJ SOLN
20000.0000 [IU] | Freq: Once | INTRAMUSCULAR | Status: AC
  Administered 2023-11-09: 20000 [IU] via SUBCUTANEOUS

## 2023-11-20 ENCOUNTER — Ambulatory Visit: Payer: Medicare HMO | Admitting: Nurse Practitioner

## 2023-11-21 ENCOUNTER — Ambulatory Visit: Payer: Medicare HMO | Admitting: Nurse Practitioner

## 2023-11-23 ENCOUNTER — Encounter (HOSPITAL_COMMUNITY)
Admission: RE | Admit: 2023-11-23 | Discharge: 2023-11-23 | Disposition: A | Discharge status: Home / Self Care | Source: Ambulatory Visit | Attending: Nephrology | Admitting: Nephrology

## 2023-11-23 VITALS — BP 161/77 | HR 63 | Temp 98.5°F | Resp 16

## 2023-11-23 VITALS — BP 156/71 | HR 75 | Temp 98.0°F | Resp 16

## 2023-11-23 DIAGNOSIS — D509 Iron deficiency anemia, unspecified: Secondary | ICD-10-CM

## 2023-11-23 DIAGNOSIS — D631 Anemia in chronic kidney disease: Secondary | ICD-10-CM

## 2023-11-23 DIAGNOSIS — N185 Chronic kidney disease, stage 5: Secondary | ICD-10-CM | POA: Diagnosis not present

## 2023-11-23 LAB — POCT HEMOGLOBIN-HEMACUE: Hemoglobin: 8.1 g/dL — ABNORMAL LOW (ref 12.0–15.0)

## 2023-11-23 MED ORDER — IRON SUCROSE 200 MG IVPB - SIMPLE MED
200.0000 mg | Freq: Once | Status: AC
  Administered 2023-11-23: 200 mg via INTRAVENOUS
  Filled 2023-11-23: qty 200

## 2023-11-23 MED ORDER — EPOETIN ALFA-EPBX 20000 UNIT/ML IJ SOLN
INTRAMUSCULAR | Status: AC
Start: 2023-11-23 — End: 2023-11-23
  Filled 2023-11-23: qty 1

## 2023-11-23 MED ORDER — EPOETIN ALFA-EPBX 20000 UNIT/ML IJ SOLN
20000.0000 [IU] | Freq: Once | INTRAMUSCULAR | Status: AC
  Administered 2023-11-23: 20000 [IU] via SUBCUTANEOUS

## 2023-11-26 ENCOUNTER — Encounter (HOSPITAL_COMMUNITY)
Admission: RE | Admit: 2023-11-26 | Discharge: 2023-11-26 | Disposition: A | Discharge status: Home / Self Care | Source: Ambulatory Visit | Attending: Nephrology | Admitting: Nephrology

## 2023-11-26 VITALS — BP 133/82 | HR 62 | Temp 97.8°F | Resp 16

## 2023-11-26 DIAGNOSIS — D631 Anemia in chronic kidney disease: Secondary | ICD-10-CM | POA: Diagnosis present

## 2023-11-26 DIAGNOSIS — N186 End stage renal disease: Secondary | ICD-10-CM | POA: Diagnosis present

## 2023-11-26 DIAGNOSIS — D509 Iron deficiency anemia, unspecified: Secondary | ICD-10-CM | POA: Diagnosis present

## 2023-11-26 MED ORDER — IRON SUCROSE 200 MG IVPB - SIMPLE MED
200.0000 mg | Freq: Once | Status: AC
  Administered 2023-11-26: 200 mg via INTRAVENOUS
  Filled 2023-11-26: qty 200

## 2023-11-27 ENCOUNTER — Encounter (HOSPITAL_COMMUNITY): Payer: Self-pay | Admitting: Nephrology

## 2023-11-28 ENCOUNTER — Ambulatory Visit: Admitting: Podiatry

## 2023-11-28 ENCOUNTER — Encounter: Payer: Self-pay | Admitting: Podiatry

## 2023-11-28 ENCOUNTER — Encounter (HOSPITAL_COMMUNITY)
Admission: RE | Admit: 2023-11-28 | Discharge: 2023-11-28 | Disposition: A | Source: Ambulatory Visit | Attending: Nephrology

## 2023-11-28 VITALS — BP 149/77 | HR 70 | Temp 98.5°F | Resp 16

## 2023-11-28 DIAGNOSIS — D509 Iron deficiency anemia, unspecified: Secondary | ICD-10-CM

## 2023-11-28 DIAGNOSIS — M722 Plantar fascial fibromatosis: Secondary | ICD-10-CM | POA: Diagnosis not present

## 2023-11-28 DIAGNOSIS — N186 End stage renal disease: Secondary | ICD-10-CM | POA: Diagnosis not present

## 2023-11-28 DIAGNOSIS — D631 Anemia in chronic kidney disease: Secondary | ICD-10-CM

## 2023-11-28 MED ORDER — PREDNISONE 5 MG PO TABS
ORAL_TABLET | ORAL | 1 refills | Status: AC
Start: 2023-11-28 — End: ?

## 2023-11-28 MED ORDER — IRON SUCROSE 200 MG IVPB - SIMPLE MED
200.0000 mg | Freq: Once | Status: AC
  Administered 2023-11-28: 200 mg via INTRAVENOUS
  Filled 2023-11-28: qty 200

## 2023-11-28 NOTE — Progress Notes (Signed)
 Still complaining of pain on the heel and the arch on the right.  Second injection did not seem to make any improvement.  The first 1 she got significant improvement with.  Pain level now back about the same as it was originally.   Physical exam:  General appearance: Pleasant, and in no acute distress. AOx3.  Vascular: Pedal pulses: DP 2/4 bilaterally, PT 2/4 bilaterally.  Mild to moderate edema lower legs bilaterally. Capillary fill time immediate bilaterally.  Neurological: Negative Tinel's sign tarsal tunnel and porta pedis bilaterally  Dermatologic:   Skin normal temperature bilaterally.  Skin normal color, tone, and texture bilaterally.   Musculoskeletal: Tenderness of plantar medial aspect calcaneus at medial calcaneal tubercle and along the plastic plantar fascia extending into middle one third of the plantar fascia right   Diagnosis: 1.  Plantar fasciitis right  Plan: -Established office visit for evaluation and management level 3. - Discussed with plantar fasciitis will try 12-day taper of prednisone .  Also try night splints. -Rx prednisone  5 mg, 30 mg p.o. daily first day, then decrease by 5 mg every other day for 12 days -Dispensed night splint right   Return 2 weeks follow-up plantar fasciitis right

## 2023-11-30 ENCOUNTER — Ambulatory Visit (HOSPITAL_COMMUNITY)
Admission: RE | Admit: 2023-11-30 | Discharge: 2023-11-30 | Disposition: A | Discharge status: Home / Self Care | Source: Ambulatory Visit | Attending: Nephrology | Admitting: Nephrology

## 2023-11-30 VITALS — BP 143/76 | HR 67 | Temp 98.2°F | Resp 16

## 2023-11-30 DIAGNOSIS — D631 Anemia in chronic kidney disease: Secondary | ICD-10-CM | POA: Insufficient documentation

## 2023-11-30 DIAGNOSIS — N186 End stage renal disease: Secondary | ICD-10-CM | POA: Diagnosis present

## 2023-11-30 DIAGNOSIS — D509 Iron deficiency anemia, unspecified: Secondary | ICD-10-CM | POA: Diagnosis present

## 2023-11-30 MED ORDER — IRON SUCROSE 200 MG IVPB - SIMPLE MED
200.0000 mg | Freq: Once | Status: AC
  Administered 2023-11-30: 200 mg via INTRAVENOUS
  Filled 2023-11-30: qty 200

## 2023-12-03 ENCOUNTER — Ambulatory Visit (HOSPITAL_COMMUNITY)
Admission: RE | Admit: 2023-12-03 | Discharge: 2023-12-03 | Disposition: A | Discharge status: Home / Self Care | Source: Ambulatory Visit | Attending: Nephrology | Admitting: Nephrology

## 2023-12-03 VITALS — BP 164/84 | HR 77 | Temp 97.5°F | Resp 16

## 2023-12-03 DIAGNOSIS — D509 Iron deficiency anemia, unspecified: Secondary | ICD-10-CM

## 2023-12-03 DIAGNOSIS — N186 End stage renal disease: Secondary | ICD-10-CM | POA: Diagnosis not present

## 2023-12-03 DIAGNOSIS — D631 Anemia in chronic kidney disease: Secondary | ICD-10-CM

## 2023-12-03 MED ORDER — IRON SUCROSE 200 MG IVPB - SIMPLE MED
200.0000 mg | Freq: Once | Status: AC
  Administered 2023-12-03: 200 mg via INTRAVENOUS
  Filled 2023-12-03: qty 200

## 2023-12-07 ENCOUNTER — Ambulatory Visit (HOSPITAL_COMMUNITY)
Admission: RE | Admit: 2023-12-07 | Discharge: 2023-12-07 | Disposition: A | Discharge status: Home / Self Care | Source: Ambulatory Visit | Attending: Nephrology | Admitting: Nephrology

## 2023-12-07 VITALS — BP 155/73 | HR 74 | Temp 98.0°F | Resp 16

## 2023-12-07 DIAGNOSIS — N185 Chronic kidney disease, stage 5: Secondary | ICD-10-CM | POA: Insufficient documentation

## 2023-12-07 DIAGNOSIS — D631 Anemia in chronic kidney disease: Secondary | ICD-10-CM | POA: Insufficient documentation

## 2023-12-07 LAB — POCT HEMOGLOBIN-HEMACUE: Hemoglobin: 9.1 g/dL — ABNORMAL LOW (ref 12.0–15.0)

## 2023-12-07 LAB — IRON AND TIBC
Iron: 52 ug/dL (ref 28–170)
Saturation Ratios: 19 % (ref 10.4–31.8)
TIBC: 273 ug/dL (ref 250–450)
UIBC: 221 ug/dL

## 2023-12-07 LAB — FERRITIN: Ferritin: 325 ng/mL — ABNORMAL HIGH (ref 11–307)

## 2023-12-07 MED ORDER — EPOETIN ALFA-EPBX 20000 UNIT/ML IJ SOLN
INTRAMUSCULAR | Status: AC
  Filled 2023-12-07: qty 1

## 2023-12-07 MED ORDER — EPOETIN ALFA-EPBX 20000 UNIT/ML IJ SOLN
20000.0000 [IU] | Freq: Once | INTRAMUSCULAR | Status: AC
  Administered 2023-12-07: 20000 [IU] via SUBCUTANEOUS

## 2023-12-12 ENCOUNTER — Ambulatory Visit: Admitting: Podiatry

## 2023-12-12 ENCOUNTER — Encounter: Payer: Self-pay | Admitting: Podiatry

## 2023-12-12 DIAGNOSIS — M722 Plantar fascial fibromatosis: Secondary | ICD-10-CM

## 2023-12-12 MED ORDER — TRIAMCINOLONE ACETONIDE 40 MG/ML IJ SUSP
40.0000 mg | Freq: Once | INTRAMUSCULAR | Status: AC
  Administered 2023-12-12: 40 mg

## 2023-12-12 NOTE — Progress Notes (Addendum)
 Patient resents with follow-up plantar fasciitis right.  Doing about 70 to 75% better.  Pain is not sharp.  Still by the end of the day she still gets some soreness in for Singh in the morning.  This pain is a lot less than it was however.  Has been using a night splint.   Physical exam:  General appearance: Pleasant, and in no acute distress. AOx3.  Vascular: Pedal pulses: DP 2/4 bilaterally, PT 2/4 bilaterally.  Mild to moderate edema lower legs bilaterally. Capillary fill time immediate bilaterally.  Neurological: There Tinel sign tarsal tunnel and porta pedis right.  Dermatologic:   Skin normal temperature bilaterally.  Skin normal color, tone, and texture bilaterally.   Musculoskeletal: Tenderness plantar lateral and plantar central aspect of the heel right.  Tenderness lateral pressure on the plantar calcaneal tubercle laterally.  No tenderness lateral compression of calcaneus   Diagnosis: 1.  Plantar fasciitis right  Plan: -Will try third injection.  If she continues to have pain we will consider physical therapy.  Continue using night splint. -injected 3cc 2:1 mixture 0.5 cc Marcaine: Triamcinolone  40 mg/1ml at lateral origin plantar fascia at lateral plantar calcaneal tubercle right.    Return 3 weeks follow-up plantar fasciitis injection right

## 2023-12-24 ENCOUNTER — Ambulatory Visit (HOSPITAL_COMMUNITY)
Admission: RE | Admit: 2023-12-24 | Discharge: 2023-12-24 | Disposition: A | Discharge status: Home / Self Care | Source: Ambulatory Visit | Attending: Nephrology | Admitting: Nephrology

## 2023-12-24 VITALS — BP 158/82 | HR 86 | Temp 97.7°F | Resp 16

## 2023-12-24 DIAGNOSIS — D631 Anemia in chronic kidney disease: Secondary | ICD-10-CM | POA: Diagnosis present

## 2023-12-24 DIAGNOSIS — N185 Chronic kidney disease, stage 5: Secondary | ICD-10-CM | POA: Diagnosis present

## 2023-12-24 LAB — POCT HEMOGLOBIN-HEMACUE: Hemoglobin: 8.7 g/dL — ABNORMAL LOW (ref 12.0–15.0)

## 2023-12-24 MED ORDER — EPOETIN ALFA-EPBX 20000 UNIT/ML IJ SOLN
20000.0000 [IU] | Freq: Once | INTRAMUSCULAR | Status: AC
  Administered 2023-12-24: 20000 [IU] via SUBCUTANEOUS

## 2023-12-24 MED ORDER — EPOETIN ALFA-EPBX 20000 UNIT/ML IJ SOLN
INTRAMUSCULAR | Status: AC
  Filled 2023-12-24: qty 1

## 2024-01-02 ENCOUNTER — Ambulatory Visit (INDEPENDENT_AMBULATORY_CARE_PROVIDER_SITE_OTHER): Admitting: Podiatry

## 2024-01-02 DIAGNOSIS — M722 Plantar fascial fibromatosis: Secondary | ICD-10-CM

## 2024-01-02 NOTE — Progress Notes (Signed)
 Follow-up plantar fasciitis right.  Doing much better little bit of soreness at times but otherwise doing well the sharp pain is resolved.   Physical exam:  General appearance: Pleasant, and in no acute distress. AOx3.  Vascular: Pedal pulses: DP 2/4 bilaterally, PT 2/4 bilaterally.  Minimal edema lower legs bilaterally. Capillary fill time immediate bilaterally.  Neurological: Negative Tinel sign tarsal tunnel and porta pedis right  Dermatologic:   Skin normal temperature bilaterally.  Skin normal color, tone, and texture bilaterally.   Musculoskeletal: Slight soreness at the plantar medial calcaneal tubercle right.  No tenderness lateral compression the calcaneus.  No posterior heel pain.  Diagnosis: 1.  Plantar fasciitis right  Plan: -Established office visit for evaluation and management level 3. - Will have her use a topical diclofenac  gel on the area and continue to ice and stretch.  Patient has renal disease so we will use p.o. NSAIDs.  Wear good stable supportive shoes.  Give it 3 or 4 weeks if does not go away completely or if it flares up again call us  and we can try another injection and/or PT  Return as needed

## 2024-01-07 ENCOUNTER — Inpatient Hospital Stay (HOSPITAL_COMMUNITY)
Admission: RE | Admit: 2024-01-07 | Discharge: 2024-01-07 | Disposition: A | Source: Ambulatory Visit | Attending: Nephrology

## 2024-01-07 VITALS — BP 154/86 | Temp 97.1°F | Resp 16

## 2024-01-07 DIAGNOSIS — N185 Chronic kidney disease, stage 5: Secondary | ICD-10-CM | POA: Insufficient documentation

## 2024-01-07 DIAGNOSIS — D631 Anemia in chronic kidney disease: Secondary | ICD-10-CM | POA: Diagnosis present

## 2024-01-07 LAB — IRON AND TIBC
Iron: 52 ug/dL (ref 28–170)
Saturation Ratios: 20 % (ref 10.4–31.8)
TIBC: 256 ug/dL (ref 250–450)
UIBC: 204 ug/dL

## 2024-01-07 LAB — POCT HEMOGLOBIN-HEMACUE: Hemoglobin: 9.6 g/dL — ABNORMAL LOW (ref 12.0–15.0)

## 2024-01-07 LAB — FERRITIN: Ferritin: 295 ng/mL (ref 11–307)

## 2024-01-07 MED ORDER — EPOETIN ALFA-EPBX 20000 UNIT/ML IJ SOLN
INTRAMUSCULAR | Status: AC
  Filled 2024-01-07: qty 1

## 2024-01-07 MED ORDER — EPOETIN ALFA-EPBX 20000 UNIT/ML IJ SOLN
20000.0000 [IU] | Freq: Once | INTRAMUSCULAR | Status: AC
  Administered 2024-01-07: 10:00:00 20000 [IU] via SUBCUTANEOUS

## 2024-01-21 ENCOUNTER — Encounter (HOSPITAL_COMMUNITY)
Admission: RE | Admit: 2024-01-21 | Discharge: 2024-01-21 | Disposition: A | Discharge status: Home / Self Care | Source: Ambulatory Visit | Attending: Nephrology | Admitting: Nephrology

## 2024-01-21 VITALS — BP 153/88 | HR 75 | Temp 97.4°F | Resp 16

## 2024-01-21 DIAGNOSIS — D631 Anemia in chronic kidney disease: Secondary | ICD-10-CM

## 2024-01-21 DIAGNOSIS — N185 Chronic kidney disease, stage 5: Secondary | ICD-10-CM | POA: Diagnosis not present

## 2024-01-21 LAB — POCT HEMOGLOBIN-HEMACUE: Hemoglobin: 10 g/dL — ABNORMAL LOW (ref 12.0–15.0)

## 2024-01-21 MED ORDER — EPOETIN ALFA-EPBX 20000 UNIT/ML IJ SOLN
20000.0000 [IU] | Freq: Once | INTRAMUSCULAR | Status: AC
  Administered 2024-01-21: 20000 [IU] via SUBCUTANEOUS

## 2024-01-21 MED ORDER — EPOETIN ALFA-EPBX 20000 UNIT/ML IJ SOLN
INTRAMUSCULAR | Status: AC
  Filled 2024-01-21: qty 1

## 2024-01-29 ENCOUNTER — Telehealth (HOSPITAL_COMMUNITY): Payer: Self-pay | Admitting: Pharmacy Technician

## 2024-01-29 NOTE — Telephone Encounter (Signed)
 Auth Submission: PENDING Site of care: [facility] Payer: [employer] MEDICARE  Medication & CPT/J Code(s) submitted: V4893 - RETACRIT  NON-ESRD  Diagnosis Code: N18.5, D63.1 Route of submission (phone, fax, portal): CMM KEY: [id number] Phone # Fax # Auth type: Buy/Bill HB Units/visits requested: 20,000 units every 2 weeks Reference number: [id number] Approval from: *** to ***   Had to request Hematocrit labs from [facility] before submitting. Received labs 02/12/24 and submitted auth.  Yolanda Espinoza, CPhT [facility] Phone: [phone] 02/12/2024

## 2024-01-29 NOTE — Telephone Encounter (Signed)
 SABRA

## 2024-01-31 ENCOUNTER — Encounter (HOSPITAL_COMMUNITY): Payer: Self-pay | Admitting: Nephrology

## 2024-02-04 ENCOUNTER — Encounter (HOSPITAL_COMMUNITY)
Admission: RE | Admit: 2024-02-04 | Discharge: 2024-02-04 | Disposition: A | Discharge status: Home / Self Care | Source: Ambulatory Visit | Attending: Nephrology | Admitting: Nephrology

## 2024-02-04 VITALS — BP 148/89 | HR 70 | Temp 97.1°F | Resp 16

## 2024-02-04 DIAGNOSIS — N185 Chronic kidney disease, stage 5: Secondary | ICD-10-CM | POA: Insufficient documentation

## 2024-02-04 DIAGNOSIS — D631 Anemia in chronic kidney disease: Secondary | ICD-10-CM | POA: Insufficient documentation

## 2024-02-04 LAB — IRON AND TIBC
Iron: 61 ug/dL (ref 28–170)
Saturation Ratios: 24 % (ref 10.4–31.8)
TIBC: 251 ug/dL (ref 250–450)
UIBC: 190 ug/dL

## 2024-02-04 LAB — POCT HEMOGLOBIN-HEMACUE: Hemoglobin: 9.4 g/dL — ABNORMAL LOW (ref 12.0–15.0)

## 2024-02-04 LAB — FERRITIN: Ferritin: 517 ng/mL — ABNORMAL HIGH (ref 11–307)

## 2024-02-04 MED ORDER — EPOETIN ALFA-EPBX 20000 UNIT/ML IJ SOLN
20000.0000 [IU] | Freq: Once | INTRAMUSCULAR | Status: AC
  Administered 2024-02-04: 20000 [IU] via SUBCUTANEOUS

## 2024-02-04 MED ORDER — EPOETIN ALFA-EPBX 20000 UNIT/ML IJ SOLN
INTRAMUSCULAR | Status: AC
  Filled 2024-02-04: qty 1

## 2024-02-18 ENCOUNTER — Encounter (HOSPITAL_COMMUNITY)

## 2024-02-20 ENCOUNTER — Inpatient Hospital Stay (HOSPITAL_COMMUNITY)
Admission: RE | Admit: 2024-02-20 | Discharge: 2024-02-20 | Disposition: A | Payer: Self-pay | Source: Ambulatory Visit | Attending: Nephrology

## 2024-02-20 VITALS — BP 184/101 | HR 109 | Temp 97.7°F | Resp 16

## 2024-02-20 DIAGNOSIS — N185 Chronic kidney disease, stage 5: Secondary | ICD-10-CM | POA: Diagnosis not present

## 2024-02-20 DIAGNOSIS — D631 Anemia in chronic kidney disease: Secondary | ICD-10-CM

## 2024-02-20 LAB — POCT HEMOGLOBIN-HEMACUE: Hemoglobin: 10.1 g/dL — ABNORMAL LOW (ref 12.0–15.0)

## 2024-02-20 MED ORDER — EPOETIN ALFA-EPBX 20000 UNIT/ML IJ SOLN
INTRAMUSCULAR | Status: AC
  Filled 2024-02-20: qty 1

## 2024-02-20 MED ORDER — EPOETIN ALFA-EPBX 20000 UNIT/ML IJ SOLN
20000.0000 [IU] | Freq: Once | INTRAMUSCULAR | Status: AC
  Administered 2024-02-20: 20000 [IU] via SUBCUTANEOUS

## 2024-03-05 ENCOUNTER — Encounter (HOSPITAL_COMMUNITY)

## 2024-03-19 ENCOUNTER — Encounter (HOSPITAL_COMMUNITY)
